# Patient Record
Sex: Female | Born: 1975 | Race: Black or African American
Health system: Southern US, Community
[De-identification: ages and names within clinical notes are randomized; demographics above are authoritative.]

## PROBLEM LIST (undated history)

## (undated) ENCOUNTER — Emergency Department
Discharge status: Against Medical Advice | Payer: No Typology Code available for payment source | Attending: Emergency Medicine | Admitting: Emergency Medicine

## (undated) DIAGNOSIS — R569 Unspecified convulsions: Secondary | ICD-10-CM

## (undated) DIAGNOSIS — I10 Essential (primary) hypertension: Secondary | ICD-10-CM

## (undated) DIAGNOSIS — E785 Hyperlipidemia, unspecified: Secondary | ICD-10-CM

## (undated) DIAGNOSIS — R002 Palpitations: Secondary | ICD-10-CM

## (undated) DIAGNOSIS — G43909 Migraine, unspecified, not intractable, without status migrainosus: Secondary | ICD-10-CM

## (undated) DIAGNOSIS — I712 Thoracic aortic aneurysm, without rupture, unspecified: Secondary | ICD-10-CM

## (undated) DIAGNOSIS — E7800 Pure hypercholesterolemia, unspecified: Secondary | ICD-10-CM

## (undated) DIAGNOSIS — F449 Dissociative and conversion disorder, unspecified: Secondary | ICD-10-CM

## (undated) DIAGNOSIS — R079 Chest pain, unspecified: Secondary | ICD-10-CM

## (undated) DIAGNOSIS — I639 Cerebral infarction, unspecified: Secondary | ICD-10-CM

## (undated) HISTORY — PX: APPENDECTOMY (OPEN): SHX54

## (undated) HISTORY — PX: ABDOMINAL HERNIA REPAIR: SHX539

## (undated) HISTORY — PX: CHOLECYSTECTOMY: SHX55

## (undated) HISTORY — PX: HYSTERECTOMY: SHX81

---

## 2013-05-03 ENCOUNTER — Encounter (HOSPITAL_COMMUNITY): Payer: Self-pay | Admitting: Emergency Medicine

## 2013-05-03 ENCOUNTER — Emergency Department (HOSPITAL_COMMUNITY): Payer: Medicaid - Out of State

## 2013-05-03 ENCOUNTER — Emergency Department (HOSPITAL_COMMUNITY)
Admission: EM | Admit: 2013-05-03 | Discharge: 2013-05-04 | Disposition: A | Discharge status: Home / Self Care | Payer: Medicaid - Out of State | Attending: Emergency Medicine | Admitting: Emergency Medicine

## 2013-05-03 DIAGNOSIS — R112 Nausea with vomiting, unspecified: Secondary | ICD-10-CM | POA: Insufficient documentation

## 2013-05-03 DIAGNOSIS — I10 Essential (primary) hypertension: Secondary | ICD-10-CM | POA: Insufficient documentation

## 2013-05-03 DIAGNOSIS — G40909 Epilepsy, unspecified, not intractable, without status epilepticus: Secondary | ICD-10-CM | POA: Insufficient documentation

## 2013-05-03 DIAGNOSIS — R0602 Shortness of breath: Secondary | ICD-10-CM | POA: Insufficient documentation

## 2013-05-03 DIAGNOSIS — M79609 Pain in unspecified limb: Secondary | ICD-10-CM

## 2013-05-03 DIAGNOSIS — R0789 Other chest pain: Secondary | ICD-10-CM | POA: Insufficient documentation

## 2013-05-03 DIAGNOSIS — R079 Chest pain, unspecified: Secondary | ICD-10-CM

## 2013-05-03 DIAGNOSIS — Z79899 Other long term (current) drug therapy: Secondary | ICD-10-CM | POA: Insufficient documentation

## 2013-05-03 DIAGNOSIS — R609 Edema, unspecified: Secondary | ICD-10-CM | POA: Insufficient documentation

## 2013-05-03 DIAGNOSIS — M7989 Other specified soft tissue disorders: Secondary | ICD-10-CM

## 2013-05-03 HISTORY — DX: Unspecified convulsions: R56.9

## 2013-05-03 HISTORY — DX: Essential (primary) hypertension: I10

## 2013-05-03 LAB — CBC
HCT: 33 % — ABNORMAL LOW (ref 36.0–46.0)
Hemoglobin: 10.3 g/dL — ABNORMAL LOW (ref 12.0–15.0)
MCH: 24.9 pg — ABNORMAL LOW (ref 26.0–34.0)
MCHC: 31.2 g/dL (ref 30.0–36.0)
MCV: 79.7 fL (ref 78.0–100.0)
PLATELETS: 156 10*3/uL (ref 150–400)
RBC: 4.14 MIL/uL (ref 3.87–5.11)
RDW: 15.5 % (ref 11.5–15.5)
WBC: 10 10*3/uL (ref 4.0–10.5)

## 2013-05-03 LAB — BASIC METABOLIC PANEL
BUN: 9 mg/dL (ref 6–23)
CALCIUM: 9.1 mg/dL (ref 8.4–10.5)
CO2: 26 meq/L (ref 19–32)
CREATININE: 0.85 mg/dL (ref 0.50–1.10)
Chloride: 102 mEq/L (ref 96–112)
GFR calc non Af Amer: 86 mL/min — ABNORMAL LOW (ref >90)
Glucose, Bld: 88 mg/dL (ref 70–99)
Potassium: 3.4 mEq/L — ABNORMAL LOW (ref 3.7–5.3)
SODIUM: 140 meq/L (ref 137–147)

## 2013-05-03 LAB — I-STAT TROPONIN, ED
TROPONIN I, POC: 0.01 ng/mL (ref 0.00–0.08)
Troponin i, poc: 0 ng/mL (ref 0.00–0.08)
Troponin i, poc: 0 ng/mL (ref 0.00–0.08)

## 2013-05-03 LAB — POC URINE PREG, ED: Preg Test, Ur: NEGATIVE

## 2013-05-03 MED ORDER — HYDROCODONE-ACETAMINOPHEN 5-325 MG PO TABS
2.0000 | ORAL_TABLET | Freq: Once | ORAL | Status: AC
  Administered 2013-05-03: 2 via ORAL
  Filled 2013-05-03: qty 2

## 2013-05-03 MED ORDER — HYDROMORPHONE HCL PF 1 MG/ML IJ SOLN
1.0000 mg | Freq: Once | INTRAMUSCULAR | Status: AC
  Administered 2013-05-03: 1 mg via INTRAVENOUS
  Filled 2013-05-03: qty 1

## 2013-05-03 NOTE — ED Provider Notes (Signed)
CSN: [id number]     Arrival date & time 05/03/13  1543 History   First MD Initiated Contact with Patient 05/03/13 1730     Chief Complaint  Patient presents with  . Chest Pain     (Consider location/radiation/quality/duration/timing/severity/associated sxs/prior Treatment) HPI 38 year old female with no history of blood clots, not on birth control, did travel about 5 hours within the last week now presents with a two-hour spell this morning approximately 7 AM a sudden onset sharp stabbing chest pain shortness breath feeling with nausea and a few episodes of nonbloody vomiting, spell resolved completely then a few hours later she developed some pain to her left calf and mild swelling to her left lower leg compared to right lower leg, she has had persistent lower leg pain and swelling throughout the day today and upon arrival to the emergency room she developed some mild vague chest discomfort present for the last couple hours without shortness of breath without nausea or vomiting without radiation or associated symptoms, there is no treatment prior to arrival. Past Medical History  Diagnosis Date  . Seizures   . Hypertension    History reviewed. No pertinent past surgical history. No family history on file. History  Substance Use Topics  . Smoking status: Never Smoker   . Smokeless tobacco: Not on file  . Alcohol Use: No   OB History   Grav Para Term Preterm Abortions TAB SAB Ect Mult Living                 Review of Systems 10 Systems reviewed and are negative for acute change except as noted in the HPI.   Allergies  Contrast media and Shellfish allergy  Home Medications   Current Outpatient Rx  Name  Route  Sig  Dispense  Refill  . diltiazem (CARDIZEM CD) 180 MG 24 hr capsule   Oral   Take 180 mg by mouth daily.         Marland Kitchen EPINEPHrine (EPI-PEN) 0.3 mg/0.3 mL SOAJ injection   Intramuscular   Inject 0.3 mg into the muscle once.         . levETIRAcetam (KEPPRA) 500  MG tablet   Oral   Take 500 mg by mouth 2 (two) times daily.          BP 126/54  Pulse 62  Temp(Src) 98.1 F (36.7 C) (Oral)  Resp 18  Wt 309 lb 3 oz (140.247 kg)  SpO2 96%  LMP 04/05/2013 Physical Exam  Nursing note and vitals reviewed. Constitutional:  Awake, alert, nontoxic appearance.  HENT:  Head: Atraumatic.  Eyes: Right eye exhibits no discharge. Left eye exhibits no discharge.  Neck: Neck supple.  Cardiovascular: Normal rate and regular rhythm.   No murmur heard. Pulmonary/Chest: Effort normal and breath sounds normal. No respiratory distress. She has no wheezes. She has no rales. She exhibits no tenderness.  Abdominal: Soft. There is no tenderness. There is no rebound.  Musculoskeletal: She exhibits edema and tenderness.  Baseline ROM, no obvious new focal weakness. Right leg is nontender with trace edema to her lower leg. Left leg is nontender thigh no asymmetry of calves bilaterally however left ankle has mild edema slightly more so than the right ankle with left foot normal light touch capillary refill less than 2 seconds dorsalis pedis pulse intact good movement nontender ankle and foot has tenderness diffusely left calf without left thigh tenderness  Neurological:  Mental status and motor strength appears baseline for patient and situation.  Skin: No rash noted.  Psychiatric: She has a normal mood and affect.    ED Course  Procedures (including critical care time)  Patient understand and agree with initial ED impression and plan with expectations set for ED visit. Low risk PTP Wells PE, PERC neg. [place] neg for DVT. CT chest without PNA (not performed to r/o PE since Pt PERC neg but had questionable PNA on CXR) Pt stable in ED with no significant deterioration in condition. Patient / Family / Caregiver informed of clinical course, understand medical decision-making process, and agree with plan. Labs Review Labs Reviewed  CBC - Abnormal; Notable for the following:     Hemoglobin 10.3 (*)    HCT 33.0 (*)    MCH 24.9 (*)    All other components within normal limits  BASIC METABOLIC PANEL - Abnormal; Notable for the following:    Potassium 3.4 (*)    GFR calc non Af Amer 86 (*)    All other components within normal limits  I-STAT TROPOININ, ED  POC URINE PREG, ED  I-STAT TROPOININ, ED  Rosezena Sensor, ED   Imaging Review Dg Chest 2 View  05/03/2013   CLINICAL DATA:  Left-sided chest pain. Shortness of breath. Lower extremity edema.  EXAM: CHEST  2 VIEW  COMPARISON:  None.  FINDINGS: Suboptimal inspiration due to body habitus accounts for crowded bronchovascular markings, especially in the bases, and accentuates the cardiac silhouette. Taking this into account, cardiomediastinal silhouette unremarkable. Streaky and patchy airspace opacities in the left lower lobe. Lungs otherwise clear. No localized airspace consolidation. No pleural effusions. No pneumothorax. Normal pulmonary vascularity. Visualized bony thorax intact.  IMPRESSION: Suboptimal inspiration.  Left lower lobe pneumonia.   Electronically Signed   By: Hulan Saas M.D.   On: 05/03/2013 18:22   Ct Chest Wo Contrast  05/03/2013   CLINICAL DATA:  Chest pain, diaphoresis and vomiting.  Palpitations.  EXAM: CT CHEST WITHOUT CONTRAST  TECHNIQUE: Multidetector CT imaging of the chest was performed following the standard protocol without IV contrast.  COMPARISON:  Chest radiograph performed earlier today at 6:09 p.m.  FINDINGS: Minimal bibasilar atelectasis is noted. The lungs are otherwise clear. There is no evidence of pulmonary infarct. No significant focal consolidation, pleural effusion or pneumothorax is seen. No masses are identified.  The mediastinum is unremarkable in appearance. No mediastinal lymphadenopathy is seen. No pericardial effusions identified. The great vessels are grossly unremarkable in appearance. The visualized portions of gallbladder grossly unremarkable. No axillary  lymphadenopathy is seen.  The visualized portions of the liver and spleen are grossly unremarkable.  No acute osseous abnormalities are identified.  IMPRESSION: Minimal bibasilar atelectasis noted; lungs otherwise clear. No evidence for pulmonary infarct. Pulmonary embolus cannot be excluded on a noncontrast study.   Electronically Signed   By: Roanna Raider M.D.   On: 05/03/2013 22:14    EKG Interpretation    Date/Time:  Saturday May 03 2013 15:49:08 EST Ventricular Rate:  85 PR Interval:  144 QRS Duration: 86 QT Interval:  410 QTC Calculation: 487 R Axis:   35 Text Interpretation:  Normal sinus rhythm Possible Inferior infarct , age undetermined Anterolateral infarct , age undetermined No previous ECGs available Confirmed by [facility]  MD, Jonny Ruiz [phone]) on 05/03/2013 5:35:23 PM            MDM   Final diagnoses:  Chest pain    I doubt any other EMC precluding discharge at this time including, but not necessarily limited to the following:PE,  ACS.    Hurman HornJohn M Kailea Dannemiller, MD 05/05/13 [phone]

## 2013-05-03 NOTE — ED Notes (Signed)
Pt reports started having chest pain, sweating, and vomiting.  Pt states she has had 2 episodes since last nite.  Reports palpitations and heart racing.  Left leg swelling from ankle to knee and pulse palpable.  Pt was recently on 4 hour train ride on Monday.  Pt reports sob.

## 2013-05-03 NOTE — ED Notes (Signed)
Unsuccessful IV attempts x 2. Dr. Fonnie JarvisBednar aware

## 2013-05-03 NOTE — Discharge Instructions (Signed)
Your caregiver has diagnosed you as having chest pain that is not specific for one problem, but does not require admission.  You are at low risk for an acute heart condition or other serious illness. Chest pain comes from many different causes.  SEEK IMMEDIATE MEDICAL ATTENTION IF: You have severe chest pain, especially if the pain is crushing or pressure-like and spreads to the arms, back, neck, or jaw, or if you have sweating, nausea (feeling sick to your stomach), or shortness of breath. THIS IS AN EMERGENCY. Don't wait to see if the pain will go away. Get medical help at once. Call 911 or 0 (operator). DO NOT drive yourself to the hospital.  Your chest pain gets worse and does not go away with rest.  You have an attack of chest pain lasting longer than usual, despite rest and treatment with the medications your caregiver has prescribed.  You wake from sleep with chest pain or shortness of breath.  You feel dizzy or faint.  You have chest pain not typical of your usual pain for which you originally saw your caregiver.  Although you appear stable for discharge, you may still have a blood clot in your leg(s) called a DVT (deep venous thrombosis).  You may have received initial treatment with an injection of a blood thinner to treat DVTs, but low risk patients do not always get treated before an ultrasound (US) is performed to diagnose or rule out a DVT, due to the risks of bleeding from the medication used.  It is important you follow up for an US within one week as directed. Return to the nearest ED immediately if you develop chest pain, shortness of breath, dizziness or fainting, new color change or weakness/numbness to your affected leg(s), bleeding, severe headache, confusion or altered mental status, or other concerns.

## 2013-05-03 NOTE — ED Notes (Signed)
Clarified with Dr. Fonnie JarvisBednar that patietn is scheduled for angio but is allergic to shellfish.  Also patient is complaining of pain.  MD acknowledges, orders pain medication and allows for CT angio study.

## 2013-05-03 NOTE — ED Notes (Signed)
Patient transported to X-ray 

## 2013-05-03 NOTE — Progress Notes (Signed)
VASCULAR LAB PRELIMINARY  PRELIMINARY  PRELIMINARY  PRELIMINARY  Left lower extremity venous Doppler completed.    Preliminary report:  There is no obvious evidence of DVT or SVT noted in the left lower extremity.  Jeremyah Jelley, RVT 05/03/2013, 6:24 PM

## 2013-05-04 MED ORDER — ONDANSETRON HCL 4 MG PO TABS
8.0000 mg | ORAL_TABLET | Freq: Once | ORAL | Status: DC
Start: 2013-05-04 — End: 2013-05-04

## 2013-05-04 MED ORDER — ONDANSETRON 4 MG PO TBDP
ORAL_TABLET | ORAL | Status: AC
  Administered 2013-05-04: 8 mg via ORAL
  Filled 2013-05-04: qty 2

## 2013-05-04 MED ORDER — ONDANSETRON 4 MG PO TBDP
8.0000 mg | ORAL_TABLET | Freq: Once | ORAL | Status: AC
  Administered 2013-05-04: 8 mg via ORAL

## 2013-05-04 NOTE — ED Notes (Signed)
Pt became very nauseas and had episode of vomiting. Medication given for nausea per protocol. Pt given emesis bag for ride home. Patient stated she did not want to stay and wanted to go home. Pt wheeled out via wheelchair.

## 2014-10-24 IMAGING — CT CT CHEST W/O CM
2 of 3 series · 15 of 36 positions shown, 18 images · non-contrast
Comparison: Chest radiograph performed earlier today at [DATE] p.m.

CLINICAL DATA: Chest pain, diaphoresis and vomiting.  Palpitations.

EXAM:
CT CHEST WITHOUT CONTRAST
TECHNIQUE: Multidetector CT imaging of the chest was performed following the
standard protocol without IV contrast.

[Series 2: thorax 5.0 i31f 1 · axial · 0.65mm/px · z∈[+1169,+1399]mm · 12 of 54 slices shown, 15 images]
[im 4/54  mediastinal]
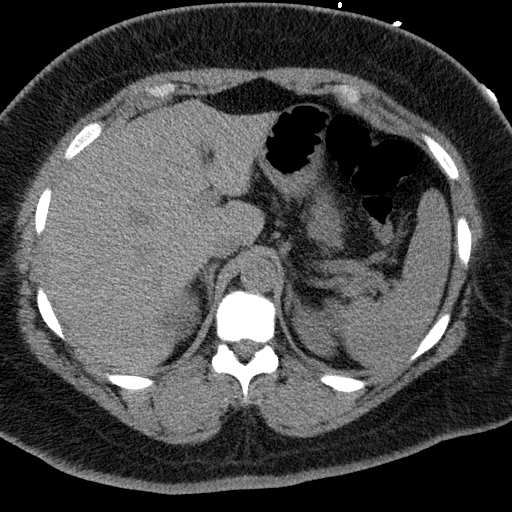
[im 4/54  lung]
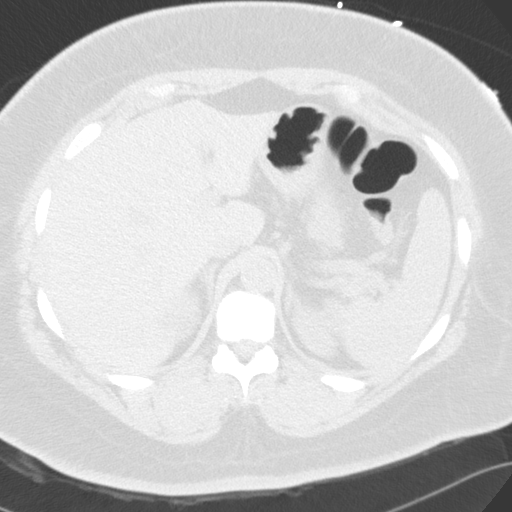
[im 8/54  lung]
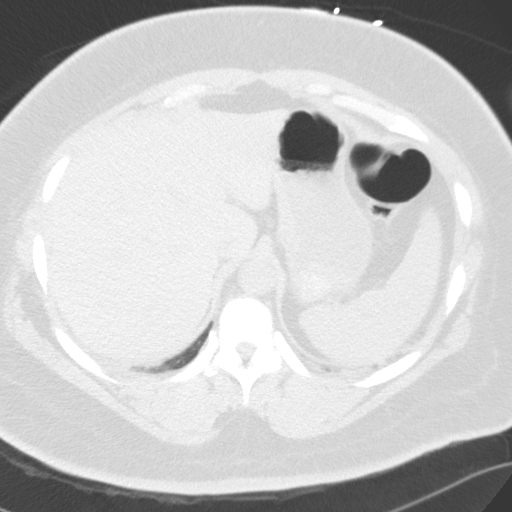
[im 12/54  lung]
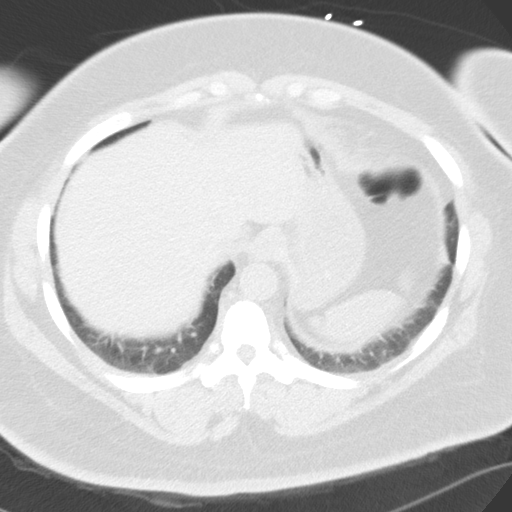
[im 16/54  lung]
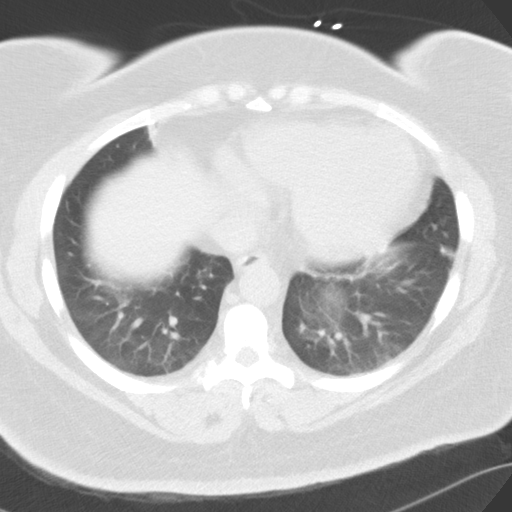
[im 20/54  mediastinal]
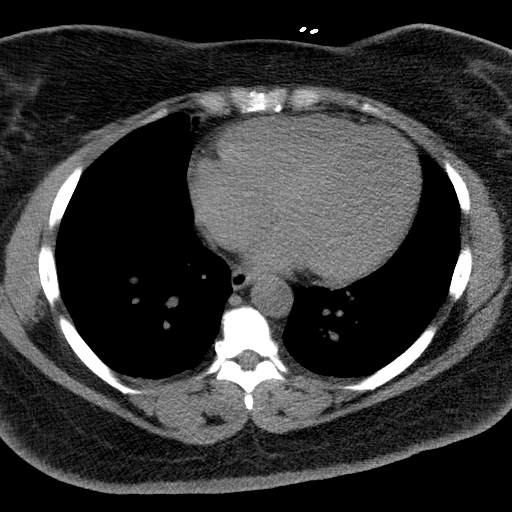
[im 20/54  lung]
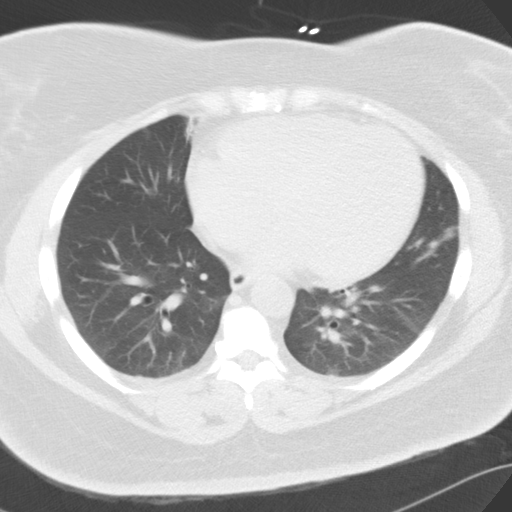
[im 24/54  lung]
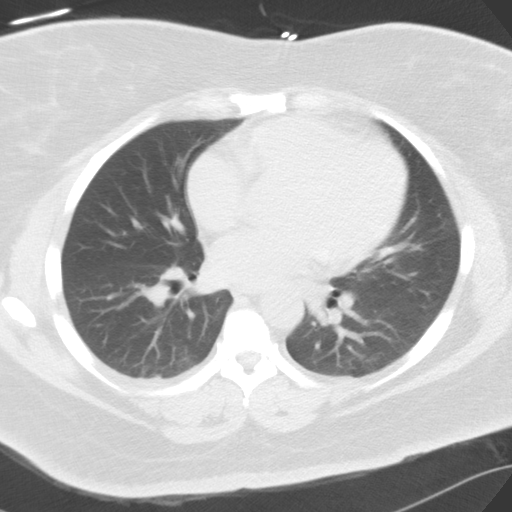
[im 30/54  lung]
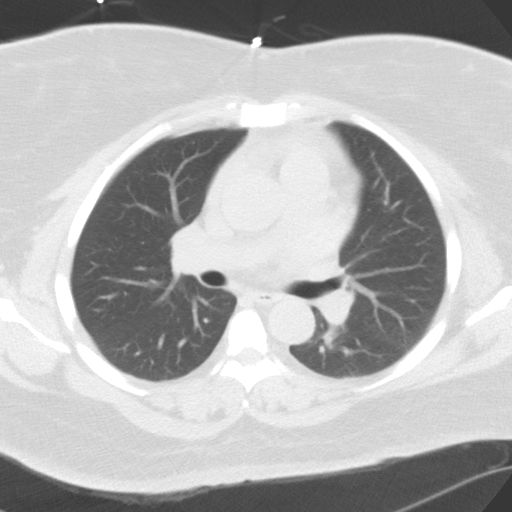
[im 34/54  lung]
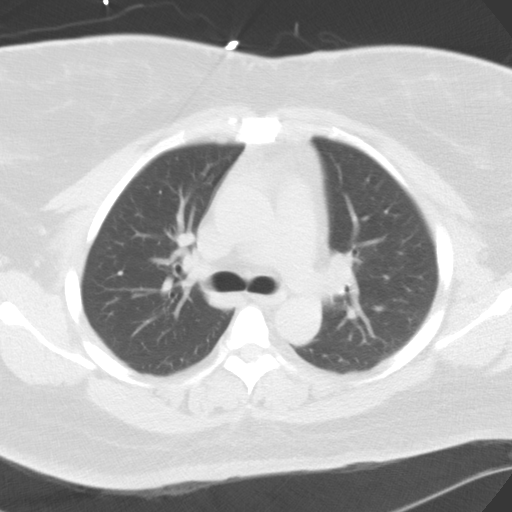
[im 38/54  mediastinal]
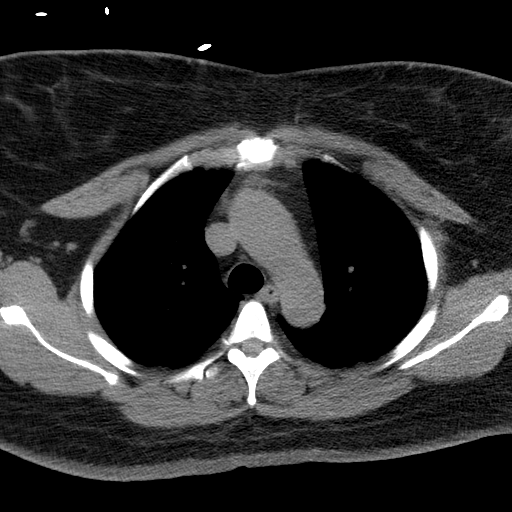
[im 38/54  lung]
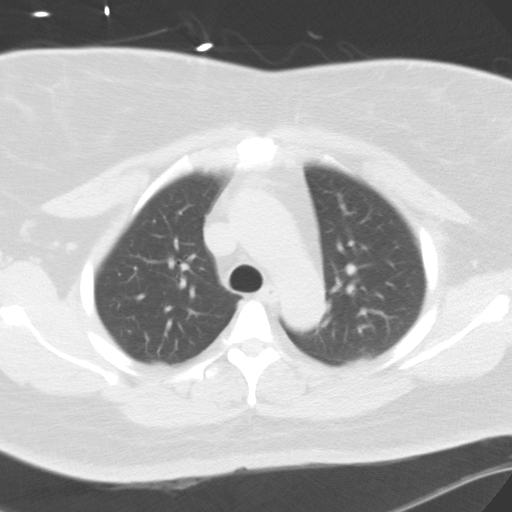
[im 42/54  lung]
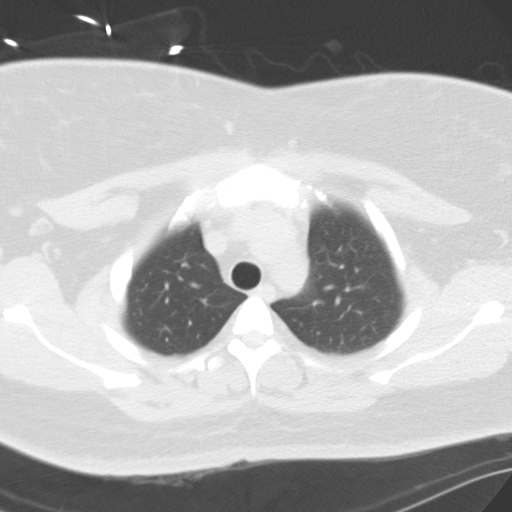
[im 46/54  lung]
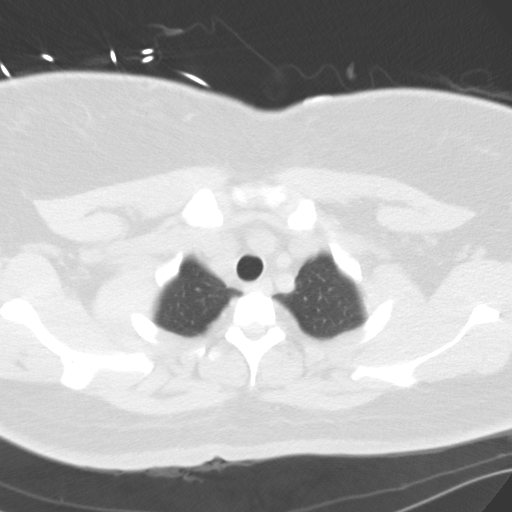
[im 50/54  lung]
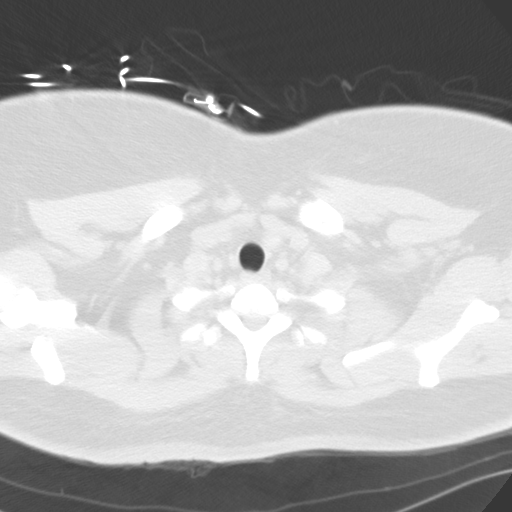

[Series 7: coronal · coronal · 0.59mm/px · 3 of 75 slices shown]
[im 15/75  lung]
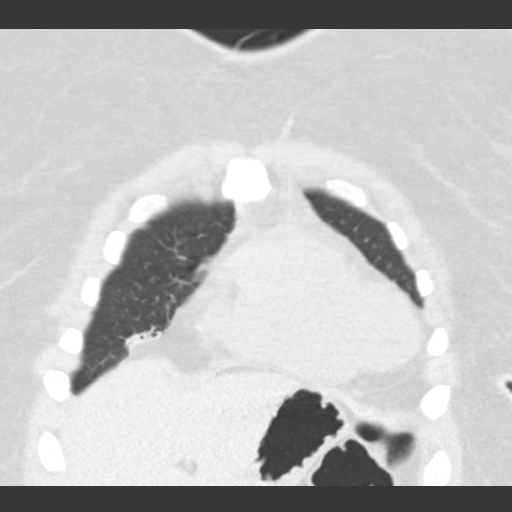
[im 30/75  lung]
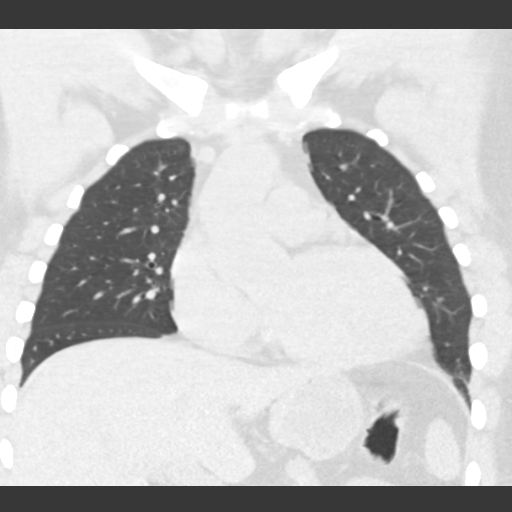
[im 45/75  lung]
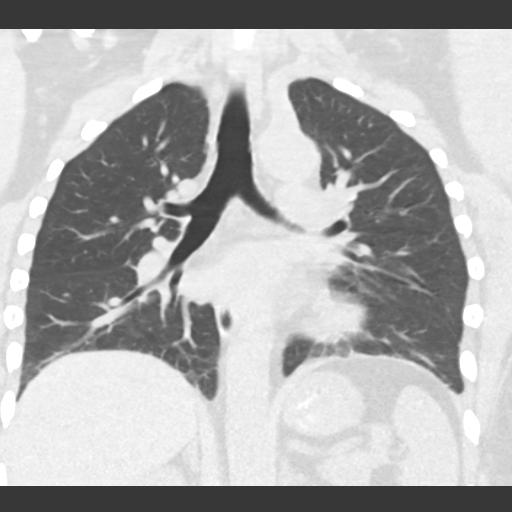

[15 of 36 positions shown; findings below may reference images not displayed]

FINDINGS: Minimal bibasilar atelectasis is noted. The lungs are otherwise
clear. There is no evidence of pulmonary infarct. No significant
focal consolidation, pleural effusion or pneumothorax is seen. No
masses are identified.

The mediastinum is unremarkable in appearance. No mediastinal
lymphadenopathy is seen. No pericardial effusions identified. The
great vessels are grossly unremarkable in appearance. The visualized
portions of gallbladder grossly unremarkable. No axillary
lymphadenopathy is seen.

The visualized portions of the liver and spleen are grossly
unremarkable.

No acute osseous abnormalities are identified.
IMPRESSION: Minimal bibasilar atelectasis noted; lungs otherwise clear. No
evidence for pulmonary infarct. Pulmonary embolus cannot be excluded
on a noncontrast study.

## 2014-10-24 IMAGING — CR DG CHEST 2V
2 series · 2 of 2 positions shown · non-contrast
Comparison: None.

CLINICAL DATA: Left-sided chest pain. Shortness of breath. Lower
extremity edema.

EXAM:
CHEST  2 VIEW

[w chest pa]
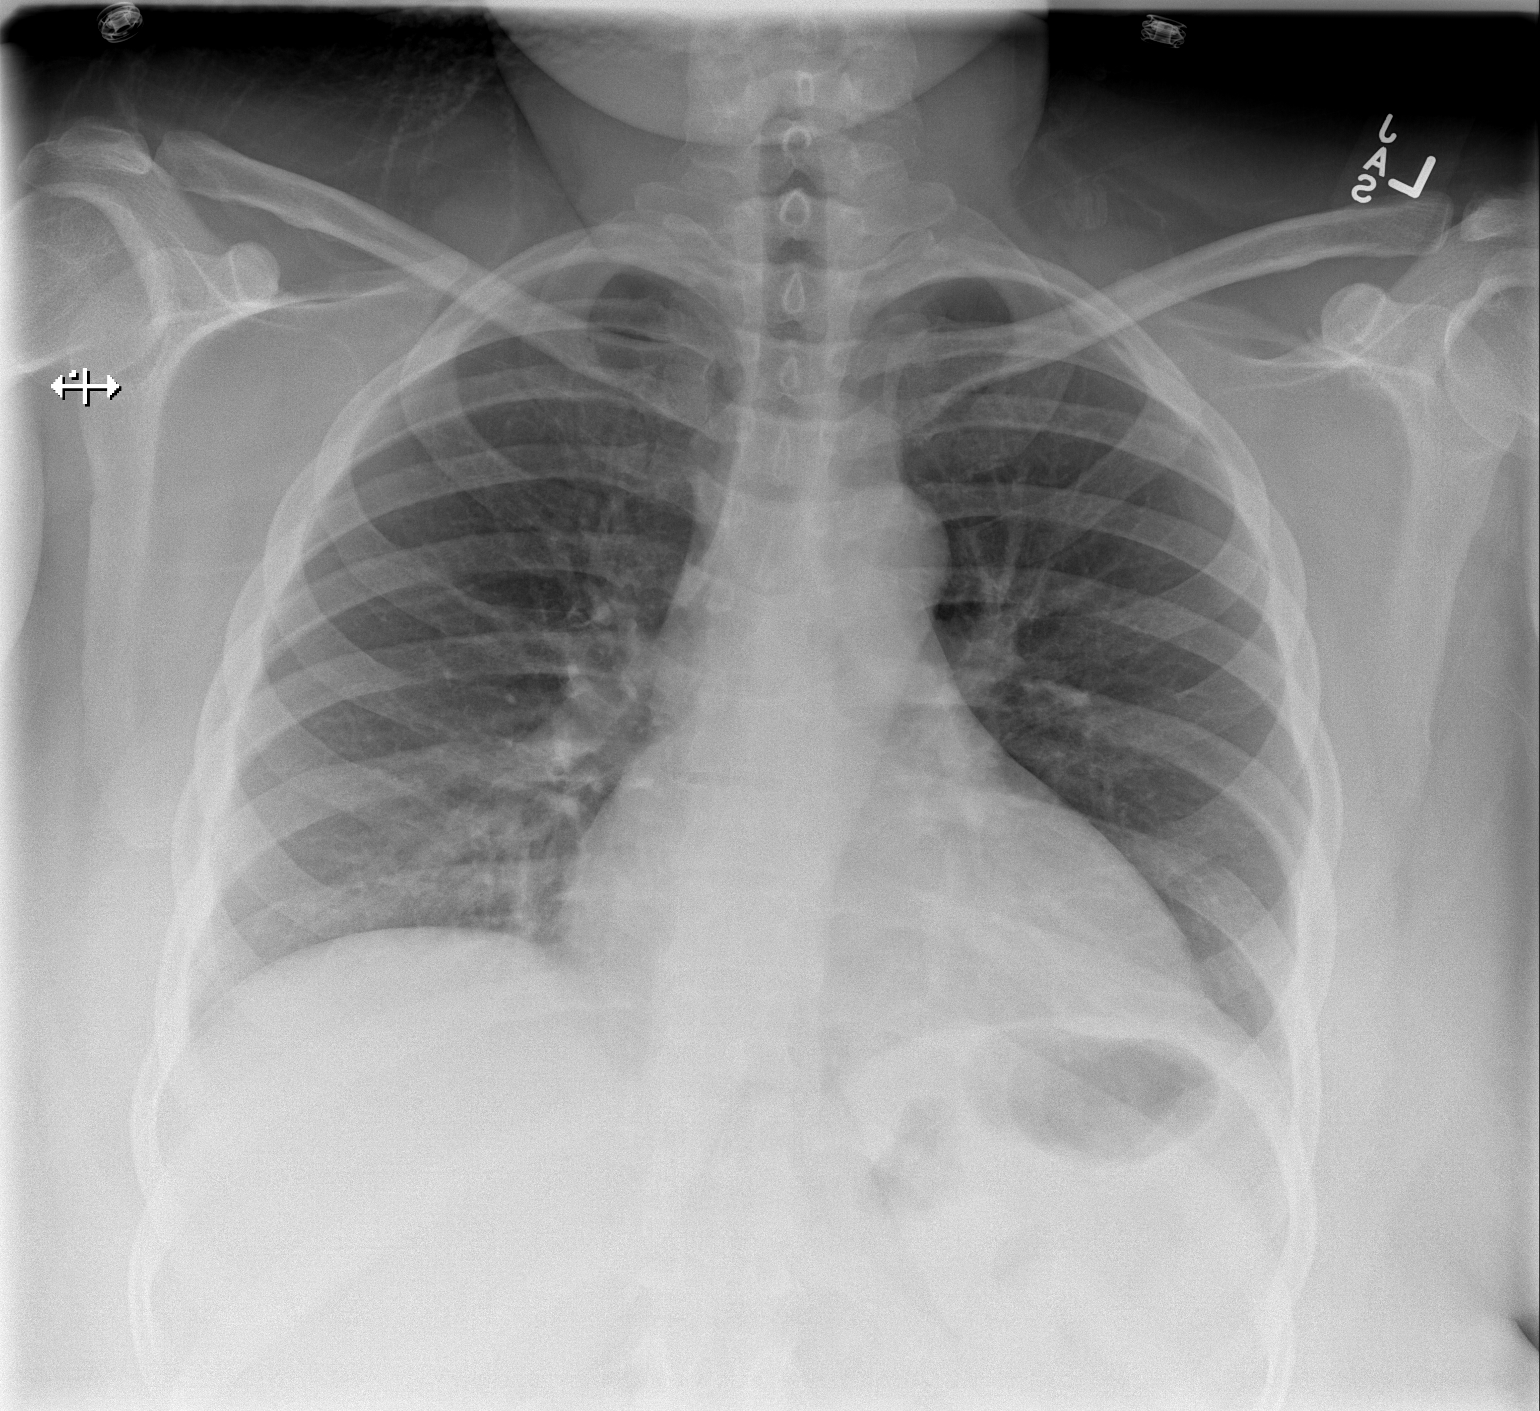

[w chest lat]
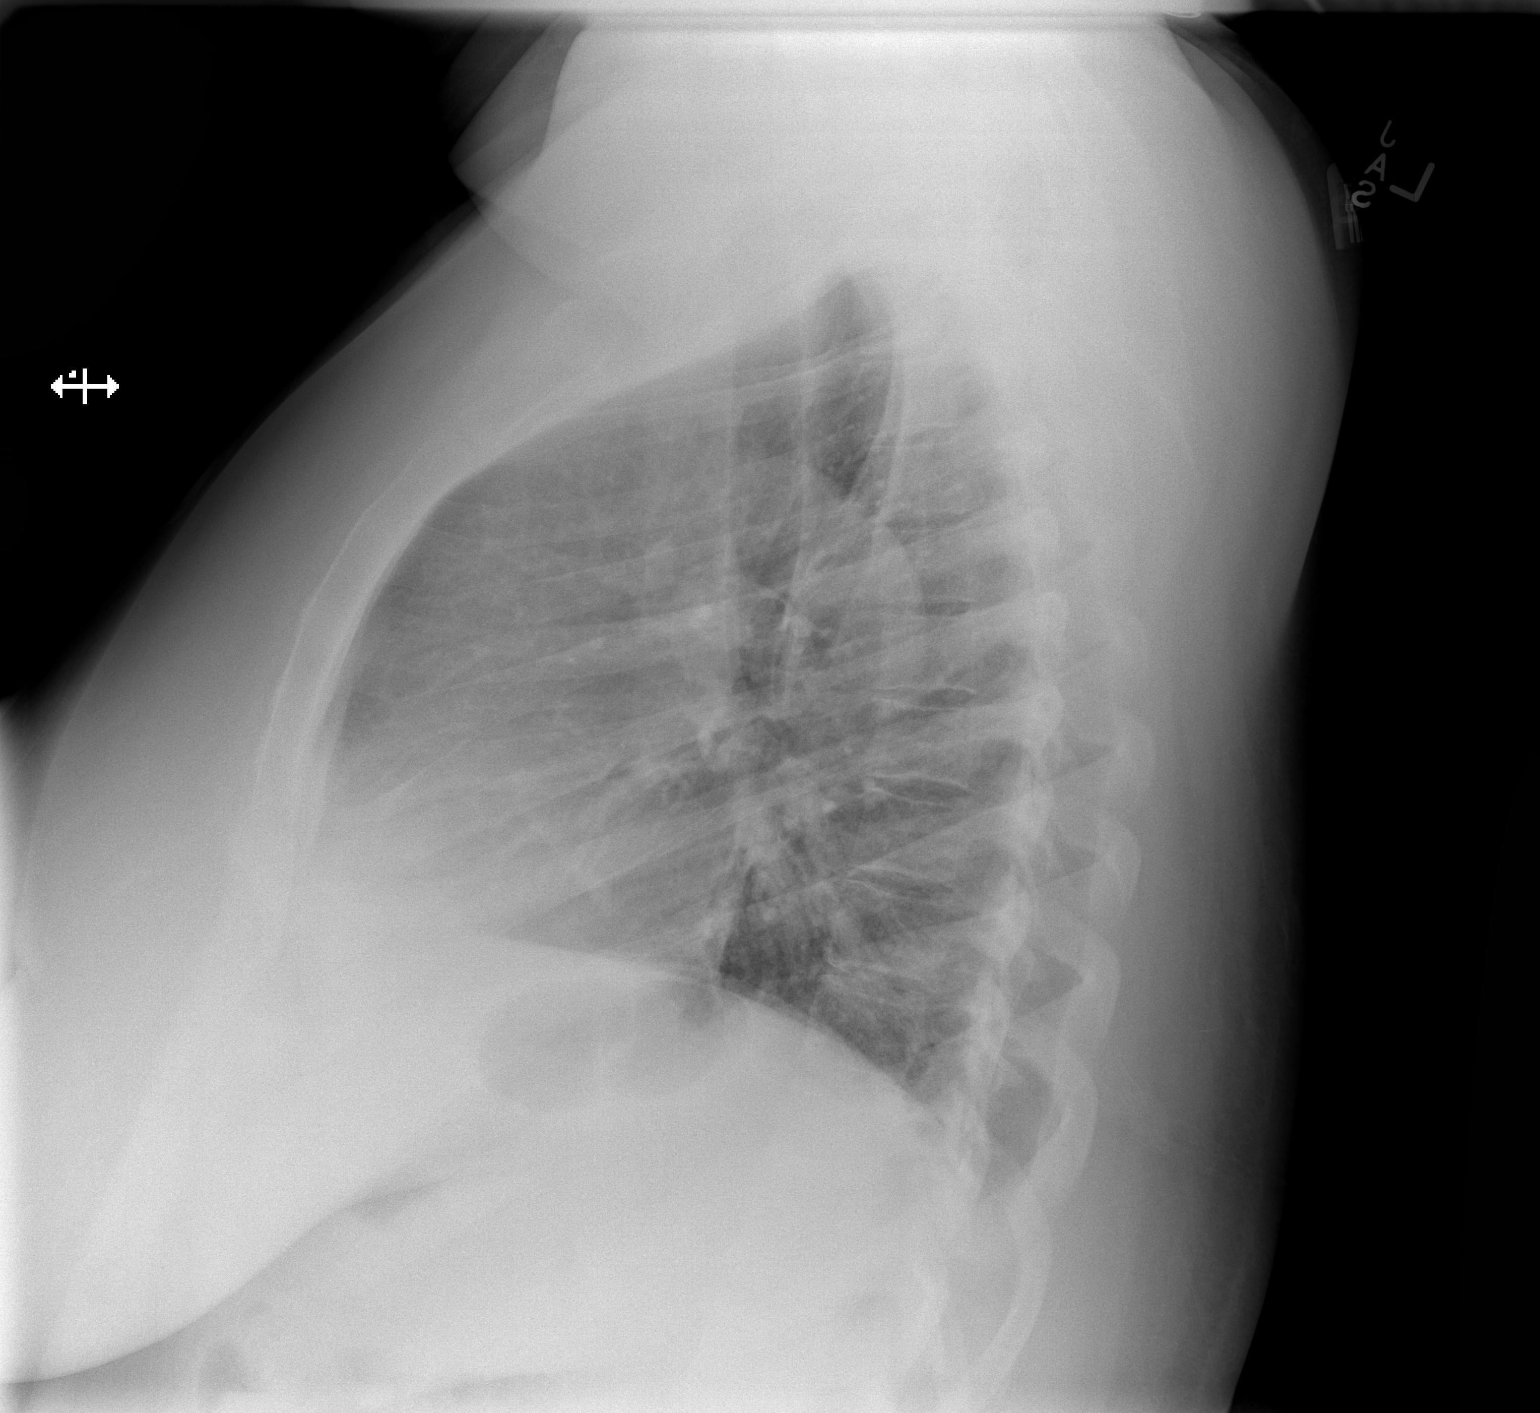

[2 of 2 positions shown; findings below may reference images not displayed]

FINDINGS: Suboptimal inspiration due to body habitus accounts for crowded
bronchovascular markings, especially in the bases, and accentuates
the cardiac silhouette. Taking this into account, cardiomediastinal
silhouette unremarkable. Streaky and patchy airspace opacities in
the left lower lobe. Lungs otherwise clear. No localized airspace
consolidation. No pleural effusions. No pneumothorax. Normal
pulmonary vascularity. Visualized bony thorax intact.
IMPRESSION: Suboptimal inspiration.  Left lower lobe pneumonia.

## 2015-03-14 DIAGNOSIS — G459 Transient cerebral ischemic attack, unspecified: Secondary | ICD-10-CM

## 2015-03-14 HISTORY — DX: Transient cerebral ischemic attack, unspecified: G45.9

## 2015-06-17 ENCOUNTER — Emergency Department: Payer: Medicaid Other

## 2015-06-17 ENCOUNTER — Observation Stay
Admission: EM | Admit: 2015-06-17 | Discharge: 2015-06-19 | Disposition: A | Discharge status: Home / Self Care | Payer: Medicaid Other | Attending: Internal Medicine | Admitting: Internal Medicine

## 2015-06-17 ENCOUNTER — Observation Stay: Payer: Self-pay | Admitting: Student in an Organized Health Care Education/Training Program

## 2015-06-17 DIAGNOSIS — E7800 Pure hypercholesterolemia, unspecified: Secondary | ICD-10-CM | POA: Insufficient documentation

## 2015-06-17 DIAGNOSIS — I10 Essential (primary) hypertension: Secondary | ICD-10-CM | POA: Insufficient documentation

## 2015-06-17 DIAGNOSIS — R079 Chest pain, unspecified: Principal | ICD-10-CM | POA: Insufficient documentation

## 2015-06-17 DIAGNOSIS — R002 Palpitations: Secondary | ICD-10-CM | POA: Insufficient documentation

## 2015-06-17 DIAGNOSIS — M25532 Pain in left wrist: Secondary | ICD-10-CM | POA: Insufficient documentation

## 2015-06-17 DIAGNOSIS — R0789 Other chest pain: Secondary | ICD-10-CM

## 2015-06-17 DIAGNOSIS — R569 Unspecified convulsions: Secondary | ICD-10-CM | POA: Insufficient documentation

## 2015-06-17 DIAGNOSIS — R072 Precordial pain: Secondary | ICD-10-CM

## 2015-06-17 HISTORY — DX: Essential (primary) hypertension: I10

## 2015-06-17 LAB — COMPREHENSIVE METABOLIC PANEL
ALT: 12 U/L (ref 0–55)
AST (SGOT): 16 U/L (ref 5–34)
Albumin/Globulin Ratio: 0.9 (ref 0.9–2.2)
Albumin: 3.9 g/dL (ref 3.5–5.0)
Alkaline Phosphatase: 94 U/L (ref 37–106)
Anion Gap: 8 (ref 5.0–15.0)
BUN: 13 mg/dL (ref 7.0–19.0)
Bilirubin, Total: 0.8 mg/dL (ref 0.2–1.2)
CO2: 26 mEq/L (ref 22–29)
Calcium: 9.4 mg/dL (ref 8.5–10.5)
Chloride: 106 mEq/L (ref 100–111)
Creatinine: 0.9 mg/dL (ref 0.6–1.0)
Globulin: 4.5 g/dL — ABNORMAL HIGH (ref 2.0–3.6)
Glucose: 85 mg/dL (ref 70–100)
Potassium: 4.2 mEq/L (ref 3.5–5.1)
Protein, Total: 8.4 g/dL — ABNORMAL HIGH (ref 6.0–8.3)
Sodium: 140 mEq/L (ref 136–145)

## 2015-06-17 LAB — CBC AND DIFFERENTIAL
Basophils Absolute Automated: 0.03 10*3/uL (ref 0.00–0.20)
Basophils Automated: 0 %
Eosinophils Absolute Automated: 0.26 10*3/uL (ref 0.00–0.70)
Eosinophils Automated: 3 %
Hematocrit: 32.7 % — ABNORMAL LOW (ref 37.0–47.0)
Hgb: 9.9 g/dL — ABNORMAL LOW (ref 12.0–16.0)
Immature Granulocytes Absolute: 0.02 10*3/uL
Immature Granulocytes: 0 %
Lymphocytes Absolute Automated: 2.41 10*3/uL (ref 0.50–4.40)
Lymphocytes Automated: 26 %
MCH: 23.6 pg — ABNORMAL LOW (ref 28.0–32.0)
MCHC: 30.3 g/dL — ABNORMAL LOW (ref 32.0–36.0)
MCV: 78 fL — ABNORMAL LOW (ref 80.0–100.0)
Monocytes Absolute Automated: 0.58 10*3/uL (ref 0.00–1.20)
Monocytes: 6 %
Neutrophils Absolute: 6.06 10*3/uL (ref 1.80–8.10)
Neutrophils: 65 %
Nucleated RBC: 0 /100 WBC (ref 0–1)
Platelets: 180 10*3/uL (ref 140–400)
RBC: 4.19 10*6/uL — ABNORMAL LOW (ref 4.20–5.40)
RDW: 17 % — ABNORMAL HIGH (ref 12–15)
WBC: 9.56 10*3/uL (ref 3.50–10.80)

## 2015-06-17 LAB — HEMOLYSIS INDEX: Hemolysis Index: 4 (ref 0–18)

## 2015-06-17 LAB — GFR: EGFR: >60

## 2015-06-17 LAB — TROPONIN I
Troponin I: <0.01 ng/mL (ref 0.00–0.09)
Troponin I: <0.01 ng/mL (ref 0.00–0.09)

## 2015-06-17 MED ORDER — ACETAMINOPHEN 325 MG PO TABS
650.0000 mg | ORAL_TABLET | Freq: Once | ORAL | Status: AC
Start: 2015-06-17 — End: 2015-06-17
  Administered 2015-06-17: 650 mg via ORAL
  Filled 2015-06-17: qty 2

## 2015-06-17 MED ORDER — MORPHINE SULFATE 10 MG/ML IJ/IV SOLN (WRAP)
6.0000 mg | Freq: Once | Status: AC
Start: 2015-06-17 — End: 2015-06-17
  Administered 2015-06-17: 6 mg via INTRAVENOUS
  Filled 2015-06-17: qty 1

## 2015-06-17 MED ORDER — FAMOTIDINE 20 MG PO TABS
20.0000 mg | ORAL_TABLET | Freq: Once | ORAL | Status: AC
Start: 2015-06-17 — End: 2015-06-17
  Administered 2015-06-17: 20 mg via ORAL
  Filled 2015-06-17: qty 1

## 2015-06-17 MED ORDER — ALUM & MAG HYDROXIDE-SIMETH 200-200-20 MG/5ML PO SUSP
30.0000 mL | Freq: Once | ORAL | Status: AC
Start: 2015-06-17 — End: 2015-06-17
  Administered 2015-06-17: 30 mL via ORAL
  Filled 2015-06-17: qty 30

## 2015-06-17 NOTE — ED Provider Notes (Signed)
EMERGENCY DEPARTMENT HISTORY AND PHYSICAL EXAM     Physician/Midlevel provider first contact with patient: 06/17/15 2047         Date: 06/17/2015  Patient Name: [name] Cook    History of Presenting Illness     Chief Complaint   Patient presents with   . Chest Pain   . Wrist Pain       History Provided By: Patient    Chief Complaint: Chest pain   Onset: 7:00 PM   Timing: Gradual  Location: Mid-sternal   Quality: Tightness    Severity: Moderate, 7/10   Associated Symptoms: Diaphoresis, nausea, lightheadedness, palpitations, SOB, wrist pain    Additional History: [name] Cook is a 40 y.o. female with history of HTN and seizures p/w an episode of chest pain that she describes as tightness starting at 7:00 PM with associated diaphoresis, SOB, nausea, lightheadedness, and palpitations that she describes "beating so hard out of my chest." Pain worse with laying flat. Pt also reports intermittent left wrist pain over the last 2 months for which she has been taking Tylenol and wearing a brace without significant relief. Father with history of MI at age 76. Pt denies history of smoking.     PCP: No primary care provider on file.  SPECIALISTS:     No current facility-administered medications for this encounter.     Current Outpatient Prescriptions   Medication Sig Dispense Refill   . LABETALOL HCL PO Take by mouth.     . LevETIRAcetam (KEPPRA PO) Take by mouth.         Past History     Past Medical History:  Past Medical History   Diagnosis Date   . Hypertension    . Convulsions        Past Surgical History:  History reviewed. No pertinent past surgical history.    Family History:  History reviewed. No pertinent family history.    Social History:  Social History   Substance Use Topics   . Smoking status: Never Smoker    . Smokeless tobacco: None   . Alcohol Use: No       Allergies:  Allergies   Allergen Reactions   . Iodine    . Percocet [Oxycodone-Acetaminophen]    . Shellfish-Derived Products    . Toradol [Ketorolac  Tromethamine]    . Tramadol        Review of Systems     Review of Systems   Constitutional: Positive for diaphoresis.   Respiratory: Positive for shortness of breath.    Cardiovascular: Positive for chest pain.   Gastrointestinal: Positive for nausea.   Musculoskeletal: Positive for joint pain (left wrist).   Neurological:        +Lightheadedness    Endo/Heme/Allergies:        Allergy to iodine, Percocet, shellfish, Toradol, and Tramadol   Psychiatric/Behavioral: Negative for suicidal ideas and substance abuse.   All other systems reviewed and are negative.       Physical Exam   BP 127/66 mmHg  Pulse 68  Temp(Src) 97.8 F (36.6 C) (Oral)  Resp 18  Wt 126.554 kg  SpO2 96%  LMP 05/28/2015      Physical Exam   Constitutional: Patient is oriented to person, place, and time and well-developed, well-nourished, and in no distress.   Head: Normocephalic and atraumatic.   ENT/Eyes: EOM are normal. Pupils are equal, round, and reactive to light. MMM.  Neck: Normal range of motion. Neck supple.   Cardiovascular: Normal rate  and regular rhythm.   Pulmonary/Chest: Effort normal and breath sounds normal. No respiratory distress.   Abdominal: Soft. There is no tenderness. No rebound or guarding.  Musculoskeletal: Normal range of motion. No lower extremity edema. No calf tenderness. Left wrist pain with flexion and extension. No left shoulder or elbow tenderness. Normal LUE pulses.   Neurological: Patient is alert and oriented to person, place, and time. No gross weakness. Neurologically intact distally to LUE.   Skin: Skin is warm and dry.       Diagnostic Study Results     Labs -     Results     Procedure Component Value Units Date/Time    Troponin I #2 [[id number]] Collected:  06/17/15 2252    Specimen Information:  Blood Updated:  06/17/15 2319     Troponin I <0.01 ng/mL     Narrative:      Enter the appropriate time    Troponin I [[id number]] Collected:  06/17/15 2020    Specimen Information:  Blood Updated:  06/17/15  2225     Troponin I <0.01 ng/mL     Comprehensive metabolic panel [[id number]]  (Abnormal) Collected:  06/17/15 2020    Specimen Information:  Blood Updated:  06/17/15 2101     Glucose 85 mg/dL      BUN 03.4 mg/dL      Creatinine 0.9 mg/dL      Sodium 742 mEq/L      Potassium 4.2 mEq/L      Chloride 106 mEq/L      CO2 26 mEq/L      Calcium 9.4 mg/dL      Protein, Total 8.4 (H) g/dL      Albumin 3.9 g/dL      AST (SGOT) 16 U/L      ALT 12 U/L      Alkaline Phosphatase 94 U/L      Bilirubin, Total 0.8 mg/dL      Globulin 4.5 (H) g/dL      Albumin/Globulin Ratio 0.9      Anion Gap 8.0     Hemolysis index [[id number]] Collected:  06/17/15 2020     Hemolysis Index 4 Updated:  06/17/15 2101    GFR [[id number]] Collected:  06/17/15 2020     EGFR >60.0 Updated:  06/17/15 2101    CBC and differential [[id number]]  (Abnormal) Collected:  06/17/15 2020    Specimen Information:  Blood from Blood Updated:  06/17/15 2056     WBC 9.56 x10 3/uL      Hgb 9.9 (L) g/dL      Hematocrit 60.1 (L) %      Platelets 180 x10 3/uL      RBC 4.19 (L) x10 6/uL      MCV 78.0 (L) fL      MCH 23.6 (L) pg      MCHC 30.3 (L) g/dL      RDW 17 (H) %      MPV Unmeasured fL      Neutrophils 65 %      Lymphocytes Automated 26 %      Monocytes 6 %      Eosinophils Automated 3 %      Basophils Automated 0 %      Immature Granulocyte 0 %      Nucleated RBC 0 /100 WBC      Neutrophils Absolute 6.06 x10 3/uL      Abs Lymph Automated 2.41 x10 3/uL      Abs Mono  Automated 0.58 x10 3/uL      Abs Eos Automated 0.26 x10 3/uL      Absolute Baso Automated 0.03 x10 3/uL      Absolute Immature Granulocyte 0.02 x10 3/uL           Radiologic Studies -   Radiology Results (24 Hour)     Procedure Component Value Units Date/Time    XR Chest AP Portable [[id number]] Collected:  06/17/15 2050    Order Status:  Completed Updated:  06/17/15 2055    Narrative:      CLINICAL INDICATION: diffuse chest pain     COMPARISON: None available    INTERPRETATION:   A single frontal view of the  chest was obtained. .     The cardiomediastinal contour  is within normal limits for age. The  lungs are clear and the sulci are sharp.         Impression:        No acute cardiopulmonary process.    Wyatt Portela, MD   06/17/2015 8:50 PM        .      Medical Decision Making   I am the first provider for this patient.    I reviewed the vital signs, available nursing notes, past medical history, past surgical history, family history and social history.    Vital Signs: Reviewed the patient's vital signs.     Patient Vitals for the past 12 hrs:   BP Temp Pulse Resp   06/17/15 2149 127/66 mmHg 97.8 F (36.6 C) 68 18   06/17/15 1932 153/79 mmHg 97.7 F (36.5 C) 68 18       Pulse Oximetry Analysis:  Normal 98% on RA     Cardiac Monitor:  Rate: 64 bpm  Rhythm:  Normal Sinus Rhythm     EKG:  Interpreted by the Emergency Physician.   Time Interpreted: 1948   Rate: 65 bpm    Rhythm: Normal Sinus Rhythm    Interpretation: Normal axis. No ST changes    Comparison: No prior study is available for comparison.     EKG:  Interpreted by the Emergency Physician.   Time Interpreted: 2259   Rate: 68 bpm    Rhythm: Normal Sinus Rhythm    Interpretation: Normal axis. No ST changes    Comparison: Unchanged compared to 1948    Core Measures: 12-lead EKG was performed in the ED. Aspirin was not given as pt took PTA.     Admit Decision Time:  The decision to admit this patient was made by the emergency provider at 11:29 PM on 06/17/2015     Clinical Decision Support:   Heart Score         Value    History  2    EKG  0    Risk Factors  2    Total (with age)  4    Onset of pain (time of START of last episode of chest pain)?  0-3 hrs ago    Timing of repeat Troponin/EKG order  HEART Score exceeds limit for Chest Pain Pathway        Old Medical Records: Nursing notes.     ED Course:   9:28 PM - Reviewed results with pt and discussed plan for repeat troponin and EKG at 10:45 PM     10:03 PM - Checked on pt who states she is having increased pain,  will order Morphine. Pt now also mentions her sister passed away last  year from an MI at age 14.     11:21 PM - Discussed results and tx plan, including possibility of hospitalization, pt understands, agrees, and feels comfortable with plan.     11:29 PM - Discussed pt case with Dr. Merlene Laughter, internal medicine, who agrees to accept.     11:32 PM - Spoke with Dr. Loma Newton, cardiology, who will consult.     Provider Notes: 80F with very strong family hx of ACS presented with typical chest pain. Trop neg. EKG nonischemic. Discussed admission vs close cardiology f/u. Prefers admission. ASA. No red flags for PE or dissection.       Diagnosis     Clinical Impression:   1. Chest pain of uncertain etiology        Treatment Plan:   ED Disposition     Observation Admitting Physician: Derek Mound [[id number]]  Diagnosis: Chest pain [[id number]]  Estimated Length of Stay: < 2 midnights  Tentative Discharge Plan?: Home or Self Care [1]  Patient Class: Observation [104]  Bed request comments: Remote telemetry                _______________________________    Attestations: This note was prepared by Silva Bandy, acting as scribe for Burgess Estelle, MD. The scribe's documentation has been prepared under my direction and personally reviewed by me in its entirety.  I confirm that the note above accurately reflects all work, treatment, procedures, and medical decision making performed by me.  _______________________________             Roxine Caddy, MD  06/20/15 2300

## 2015-06-17 NOTE — ED Notes (Signed)
Pain and swelling L wrist ax two months, but more concerned w sudden onset c/p one hour ago described as intermittent sharp and pressure. Nausea, no vomiting. Also reports SOB.

## 2015-06-17 NOTE — H&P (Signed)
[facility]      Patient: [name] Cook  Date: 06/17/2015   DOB: [birth year]  Admission Date: 06/17/2015   MRN: [id number]  Attending: Derek Mound         Chief Complaint   Patient presents with   . Chest Pain   . Wrist Pain      History Gathered From: Patient     HISTORY AND PHYSICAL     [name] Cook is a 40 y.o. female with a PMHx of hypertension, seizures who presented with chest pain located in midsternal region that started earlier this evening. Pt reports pain to be 8/10 in intensity, intermittent in nature. Pt was laying in bed at the time of the onset of pain. She states that she went up to use the bathroom and suddenly noted herself to be diaphoretic and experiencing chest pain which she describes as pressure like and someone sitting on her chest. Pt reports pain to be radiating to her left arm and other associated symptoms to be jaw pain, nausea, no vomiting, sob. Pt reports dyspnea since the past two weeks and needing to use 6 pillows at night to breathe better. Pt denies any increased stressors or change in physical activity. She reports compliance to medications.     Past Medical History   Diagnosis Date   . Hypertension    . Convulsions        History reviewed. No pertinent past surgical history.    Prior to Admission medications    Medication Sig Start Date End Date Taking? Authorizing Provider   LABETALOL HCL PO Take by mouth.   Yes [provider]   LevETIRAcetam (KEPPRA PO) Take by mouth.   Yes [provider]       Allergies   Allergen Reactions   . Iodine    . Percocet [Oxycodone-Acetaminophen]    . Shellfish-Derived Products    . Toradol [Ketorolac Tromethamine]    . Tramadol        CODE STATUS: Full     PRIMARY CARE MD: No primary care provider on file.    Family history   Father: MI in 78s  Sister: MI at age 107     Social History   Substance Use Topics   . Smoking status: Never Smoker    . Smokeless tobacco: None   . Alcohol Use: No       REVIEW OF SYSTEMS   Positive  for: chest pain, sob, nausea, orthopnea, jaw pain, arm pain   Negative for: edema, abdominal pain, vomiting, urinary complaints, diarrhea/constiaption, anxiety   All ROS completed and otherwise negative.    PHYSICAL EXAM     Vital Signs (most recent): BP 127/66 mmHg  Pulse 68  Temp(Src) 97.8 F (36.6 C) (Oral)  Resp 18  Wt 126.554 kg (279 lb)  SpO2 96%  LMP 05/28/2015  Constitutional: No apparent distress. Patient speaks freely in full sentences.   HEENT: NC/AT, PERRL, no scleral icterus or conjunctival pallor, no nasal discharge, MMM, oropharynx without erythema or exudate  Neck: trachea midline, supple, no cervical or supraclavicular lymphadenopathy or masses  Cardiovascular: RRR, normal S1 S2, no murmurs, gallops, palpable thrills, no JVD, Non-displaced PMI.  Respiratory: Normal rate. No retractions or increased work of breathing. Clear to auscultation and percussion bilaterally.  Gastrointestinal: +BS, non-distended, soft, non-tender, no rebound or guarding, no hepatosplenomegaly  Genitourinary: no suprapubic or costovertebral angle tenderness  Musculoskeletal: ROM and motor strength grossly normal. No clubbing, edema, or cyanosis. DP and  radial pulses 2+ and symmetric.  Skin exam:  pink  Neurologic: EOMI, CN 2-12 grossly intact. no gross motor or sensory deficits  Psychiatric: AAOx3, affect and mood appropriate. The patient is alert, interactive, appropriate.  Capillary refill:  Normal    Exam done by Derek Mound, MD on 06/17/2015 at 11:49 PM      LABS & IMAGING     Recent Results (from the past 24 hour(s))   CBC and differential    Collection Time: 06/17/15  8:20 PM   Result Value Ref Range    WBC 9.56 3.50 - 10.80 x10 3/uL    Hgb 9.9 (L) 12.0 - 16.0 g/dL    Hematocrit 16.1 (L) 37.0 - 47.0 %    Platelets 180 140 - 400 x10 3/uL    RBC 4.19 (L) 4.20 - 5.40 x10 6/uL    MCV 78.0 (L) 80.0 - 100.0 fL    MCH 23.6 (L) 28.0 - 32.0 pg    MCHC 30.3 (L) 32.0 - 36.0 g/dL    RDW 17 (H) 12 - 15 %    MPV Unmeasured  9.4 - 12.3 fL    Neutrophils 65 None %    Lymphocytes Automated 26 None %    Monocytes 6 None %    Eosinophils Automated 3 None %    Basophils Automated 0 None %    Immature Granulocyte 0 None %    Nucleated RBC 0 0 - 1 /100 WBC    Neutrophils Absolute 6.06 1.80 - 8.10 x10 3/uL    Abs Lymph Automated 2.41 0.50 - 4.40 x10 3/uL    Abs Mono Automated 0.58 0.00 - 1.20 x10 3/uL    Abs Eos Automated 0.26 0.00 - 0.70 x10 3/uL    Absolute Baso Automated 0.03 0.00 - 0.20 x10 3/uL    Absolute Immature Granulocyte 0.02 0 x10 3/uL   Comprehensive metabolic panel    Collection Time: 06/17/15  8:20 PM   Result Value Ref Range    Glucose 85 70 - 100 mg/dL    BUN 09.6 7.0 - 04.5 mg/dL    Creatinine 0.9 0.6 - 1.0 mg/dL    Sodium 409 811 - 914 mEq/L    Potassium 4.2 3.5 - 5.1 mEq/L    Chloride 106 100 - 111 mEq/L    CO2 26 22 - 29 mEq/L    Calcium 9.4 8.5 - 10.5 mg/dL    Protein, Total 8.4 (H) 6.0 - 8.3 g/dL    Albumin 3.9 3.5 - 5.0 g/dL    AST (SGOT) 16 5 - 34 U/L    ALT 12 0 - 55 U/L    Alkaline Phosphatase 94 37 - 106 U/L    Bilirubin, Total 0.8 0.2 - 1.2 mg/dL    Globulin 4.5 (H) 2.0 - 3.6 g/dL    Albumin/Globulin Ratio 0.9 0.9 - 2.2    Anion Gap 8.0 5.0 - 15.0   Hemolysis index    Collection Time: 06/17/15  8:20 PM   Result Value Ref Range    Hemolysis Index 4 0 - 18   GFR    Collection Time: 06/17/15  8:20 PM   Result Value Ref Range    EGFR >60.0    Troponin I    Collection Time: 06/17/15  8:20 PM   Result Value Ref Range    Troponin I <0.01 0.00 - 0.09 ng/mL   Troponin I #2    Collection Time: 06/17/15 10:52 PM   Result Value Ref  Range    Troponin I <0.01 0.00 - 0.09 ng/mL     IMAGING:  CXR  No acute cardiopulmonary process.    Markers:    Recent Labs  Lab 06/17/15  2252 06/17/15  2020   TROPONIN I <0.01 <0.01       EMERGENCY DEPARTMENT COURSE:  Orders Placed This Encounter   Procedures   . XR Chest AP Portable   . Hemolysis index   . GFR   . CBC and differential   . Comprehensive metabolic panel   . Hemolysis index   . GFR    . Troponin I   . Troponin I #2   . Diet regular   . Pulse Oximetry   . Cardiac monitoring (ED ONLY)   . ED Unit Sec Comm Order   . ED Unit Sec Comm Order   . ECG 12 Lead   . ECG 12 lead #2   . Insert peripheral IV   . Saline lock IV   . Place (admit) for Observation Services   . [facility] ED Bed Request (Observation)       ASSESSMENT & PLAN     [name] Cook is a 40 y.o. female admitted under sound [facility] with chest pain     Patient Active Hospital Problem List:   Chest pain (06/17/2015)   Hypertension    Seizures    Hypercholesteremia     - placed on PTA meds  - EKG reviewed, no significant ST segment changes  - troponin negative x2, serial troponins ordered  - labs: lipid profile, a1c ordered  - cardiology consulted by ED physician  - NPO at midnight     Nutrition  NPO     DVT/VTE Prophylaxis  Lovenox     Fayette Pho    06/17/2015 11:49 PM  Time Elapsed: 45 minutes

## 2015-06-18 ENCOUNTER — Observation Stay: Payer: Medicaid Other

## 2015-06-18 DIAGNOSIS — R0789 Other chest pain: Secondary | ICD-10-CM

## 2015-06-18 DIAGNOSIS — R072 Precordial pain: Secondary | ICD-10-CM

## 2015-06-18 LAB — CBC AND DIFFERENTIAL
Basophils Absolute Automated: 0.02 10*3/uL (ref 0.00–0.20)
Basophils Automated: 0 %
Eosinophils Absolute Automated: 0.25 10*3/uL (ref 0.00–0.70)
Eosinophils Automated: 3 %
Hematocrit: 30.2 % — ABNORMAL LOW (ref 37.0–47.0)
Hgb: 8.9 g/dL — ABNORMAL LOW (ref 12.0–16.0)
Immature Granulocytes Absolute: 0.01 10*3/uL
Immature Granulocytes: 0 %
Lymphocytes Absolute Automated: 3.14 10*3/uL (ref 0.50–4.40)
Lymphocytes Automated: 32 %
MCH: 23.2 pg — ABNORMAL LOW (ref 28.0–32.0)
MCHC: 29.5 g/dL — ABNORMAL LOW (ref 32.0–36.0)
MCV: 78.9 fL — ABNORMAL LOW (ref 80.0–100.0)
Monocytes Absolute Automated: 0.66 10*3/uL (ref 0.00–1.20)
Monocytes: 7 %
Neutrophils Absolute: 5.65 10*3/uL (ref 1.80–8.10)
Neutrophils: 58 %
Nucleated RBC: 0 /100 WBC (ref 0–1)
Platelets: 163 10*3/uL (ref 140–400)
RBC: 3.83 10*6/uL — ABNORMAL LOW (ref 4.20–5.40)
RDW: 17 % — ABNORMAL HIGH (ref 12–15)
WBC: 9.72 10*3/uL (ref 3.50–10.80)

## 2015-06-18 LAB — LIPID PANEL
Cholesterol / HDL Ratio: 3.9
Cholesterol: 140 mg/dL (ref 0–199)
HDL: 36 mg/dL — ABNORMAL LOW (ref 40–9999)
LDL Calculated: 87 mg/dL (ref 0–99)
Triglycerides: 83 mg/dL (ref 34–149)
VLDL Calculated: 17 mg/dL (ref 10–40)

## 2015-06-18 LAB — HEMOGLOBIN A1C
Average Estimated Glucose: 108.3 mg/dL
Hemoglobin A1C: 5.4 % (ref 4.6–5.9)

## 2015-06-18 LAB — COMPREHENSIVE METABOLIC PANEL
ALT: 10 U/L (ref 0–55)
AST (SGOT): 14 U/L (ref 5–34)
Albumin/Globulin Ratio: 0.9 (ref 0.9–2.2)
Albumin: 3.4 g/dL — ABNORMAL LOW (ref 3.5–5.0)
Alkaline Phosphatase: 80 U/L (ref 37–106)
Anion Gap: 6 (ref 5.0–15.0)
BUN: 13 mg/dL (ref 7.0–19.0)
Bilirubin, Total: 0.6 mg/dL (ref 0.2–1.2)
CO2: 26 mEq/L (ref 22–29)
Calcium: 8.5 mg/dL (ref 8.5–10.5)
Chloride: 107 mEq/L (ref 100–111)
Creatinine: 0.9 mg/dL (ref 0.6–1.0)
Globulin: 3.8 g/dL — ABNORMAL HIGH (ref 2.0–3.6)
Glucose: 96 mg/dL (ref 70–100)
Potassium: 4 mEq/L (ref 3.5–5.1)
Protein, Total: 7.2 g/dL (ref 6.0–8.3)
Sodium: 139 mEq/L (ref 136–145)

## 2015-06-18 LAB — GFR: EGFR: >60

## 2015-06-18 LAB — B-TYPE NATRIURETIC PEPTIDE: B-Natriuretic Peptide: 15 pg/mL (ref 0–100)

## 2015-06-18 LAB — PHOSPHORUS: Phosphorus: 2.9 mg/dL (ref 2.3–4.7)

## 2015-06-18 LAB — IHS D-DIMER: D-Dimer: 0.56 ug/mL FEU — ABNORMAL HIGH (ref 0.00–0.51)

## 2015-06-18 LAB — URINE HCG QUALITATIVE: Urine HCG Qualitative: NEGATIVE

## 2015-06-18 LAB — MAGNESIUM: Magnesium: 2.3 mg/dL (ref 1.6–2.6)

## 2015-06-18 LAB — HEMOLYSIS INDEX
Hemolysis Index: 9 (ref 0–18)
Hemolysis Index: 25 — ABNORMAL HIGH (ref 0–18)

## 2015-06-18 LAB — TROPONIN I: Troponin I: <0.01 ng/mL (ref 0.00–0.09)

## 2015-06-18 MED ORDER — ONDANSETRON HCL 4 MG/2ML IJ SOLN
4.0000 mg | Freq: Three times a day (TID) | INTRAMUSCULAR | Status: DC | PRN
Start: 2015-06-18 — End: 2015-06-18

## 2015-06-18 MED ORDER — ENOXAPARIN SODIUM 40 MG/0.4ML SC SOLN
40.0000 mg | Freq: Every day | SUBCUTANEOUS | Status: DC
Start: 2015-06-18 — End: 2015-06-18
  Administered 2015-06-18: 40 mg via SUBCUTANEOUS
  Filled 2015-06-18: qty 0.4

## 2015-06-18 MED ORDER — ACETAMINOPHEN 650 MG RE SUPP
650.0000 mg | RECTAL | Status: DC | PRN
Start: 2015-06-18 — End: 2015-06-19

## 2015-06-18 MED ORDER — LEVETIRACETAM 500 MG PO TABS
1000.0000 mg | ORAL_TABLET | Freq: Two times a day (BID) | ORAL | Status: DC
Start: 2015-06-18 — End: 2015-06-18

## 2015-06-18 MED ORDER — NALOXONE HCL 0.4 MG/ML IJ SOLN (WRAP)
0.2000 mg | INTRAMUSCULAR | Status: DC | PRN
Start: 2015-06-18 — End: 2015-06-19

## 2015-06-18 MED ORDER — SODIUM CHLORIDE 0.9 % IV SOLN
INTRAVENOUS | Status: DC
Start: 2015-06-18 — End: 2015-06-18

## 2015-06-18 MED ORDER — MORPHINE SULFATE 4 MG/ML IJ/IV SOLN (WRAP)
4.0000 mg | Status: DC | PRN
Start: 2015-06-18 — End: 2015-06-18
  Administered 2015-06-18 (×2): 4 mg via INTRAVENOUS
  Filled 2015-06-18 (×2): qty 1

## 2015-06-18 MED ORDER — ENOXAPARIN SODIUM 150 MG/ML SC SOLN
1.0000 mg/kg | Freq: Two times a day (BID) | SUBCUTANEOUS | Status: DC
Start: 2015-06-18 — End: 2015-06-19
  Administered 2015-06-18: 130 mg via SUBCUTANEOUS
  Filled 2015-06-18 (×3): qty 1

## 2015-06-18 MED ORDER — ACETAMINOPHEN 325 MG PO TABS
650.0000 mg | ORAL_TABLET | Freq: Four times a day (QID) | ORAL | Status: AC
Start: 2015-06-18 — End: 2015-06-19
  Administered 2015-06-18: 650 mg via ORAL
  Filled 2015-06-18 (×2): qty 2

## 2015-06-18 MED ORDER — ONDANSETRON HCL 4 MG/2ML IJ SOLN
4.0000 mg | Freq: Four times a day (QID) | INTRAMUSCULAR | Status: DC | PRN
Start: 2015-06-18 — End: 2015-06-19

## 2015-06-18 MED ORDER — ACETAMINOPHEN 325 MG PO TABS
650.0000 mg | ORAL_TABLET | ORAL | Status: DC | PRN
Start: 2015-06-18 — End: 2015-06-19
  Administered 2015-06-19: 650 mg via ORAL
  Filled 2015-06-18: qty 2

## 2015-06-18 MED ORDER — LABETALOL HCL 200 MG PO TABS
200.0000 mg | ORAL_TABLET | Freq: Three times a day (TID) | ORAL | Status: DC
Start: 2015-06-18 — End: 2015-06-19
  Administered 2015-06-18 – 2015-06-19 (×4): 200 mg via ORAL
  Filled 2015-06-18 (×4): qty 1

## 2015-06-18 MED ORDER — MORPHINE SULFATE 2 MG/ML IJ/IV SOLN (WRAP)
1.0000 mg | Status: AC | PRN
Start: 2015-06-18 — End: 2015-06-19
  Administered 2015-06-18 (×3): 1 mg via INTRAVENOUS
  Filled 2015-06-18 (×3): qty 1

## 2015-06-18 MED ORDER — ONDANSETRON 4 MG PO TBDP
4.0000 mg | ORAL_TABLET | Freq: Four times a day (QID) | ORAL | Status: DC | PRN
Start: 2015-06-18 — End: 2015-06-19

## 2015-06-18 MED ORDER — MORPHINE SULFATE 2 MG/ML IJ/IV SOLN (WRAP)
1.0000 mg | Freq: Once | Status: AC
Start: 2015-06-18 — End: 2015-06-18
  Administered 2015-06-18: 1 mg via INTRAVENOUS
  Filled 2015-06-18: qty 1

## 2015-06-18 MED ORDER — TECHNETIUM TC 99M PENTETATE AEROSOL
26.4000 | Freq: Once | Status: AC | PRN
Start: 2015-06-18 — End: 2015-06-18
  Administered 2015-06-18: 26 via RESPIRATORY_TRACT

## 2015-06-18 MED ORDER — LEVETIRACETAM 500 MG PO TABS
500.0000 mg | ORAL_TABLET | Freq: Two times a day (BID) | ORAL | Status: DC
Start: 2015-06-18 — End: 2015-06-19
  Administered 2015-06-18 – 2015-06-19 (×3): 500 mg via ORAL
  Filled 2015-06-18 (×3): qty 1

## 2015-06-18 MED ORDER — TECHNETIUM TC 99M ALBUMIN AGGREGATED
5.2000 | Freq: Once | Status: AC | PRN
Start: 2015-06-18 — End: 2015-06-18
  Administered 2015-06-18: 5 via INTRAVENOUS

## 2015-06-18 MED ORDER — LABETALOL HCL 200 MG PO TABS
100.0000 mg | ORAL_TABLET | Freq: Two times a day (BID) | ORAL | Status: DC
Start: 2015-06-18 — End: 2015-06-18

## 2015-06-18 NOTE — Progress Notes (Signed)
Patient admitted to room 2312 b from the ED via stretcher with complaints of mid sternum chest pain tightness radiating to left arm. No SOB. No Palpations. Patient seems calm at this time. VSS. High falls risk. Precautions in place. Will continue to monitor.

## 2015-06-18 NOTE — Procedures (Signed)
Stress test does not show ischemia. She is safe for [place] fromm CV standpoint.

## 2015-06-18 NOTE — UM Notes (Signed)
Self pay  Place (admit) for Observation Services (Order [id number]) 06/17/15 2331    [name] Cook is a 40 y.o. female with history of HTN and seizures p/w an episode of chest pain that she describes as tightness starting at 7:00 PM with associated diaphoresis, SOB, nausea, lightheadedness, and palpitations that she describes "beating so hard out of my chest." Pain worse with laying flat. Pt also reports intermittent left wrist pain over the last 2 months for which she has been taking Tylenol and wearing a brace without significant relief. Father with history of MI at age 56. Pt denies history of smoking.     Past Medical History   Diagnosis Date   . Hypertension    . Convulsions      BP 127/66 mmHg  Pulse 68  Temp(Src) 97.8 F (36.6 C) (Oral)  Resp 18  Wt 126.554 kg  SpO2 96%  LMP 05/28/2015    Temp:  [96.3 F (35.7 C)-97.8 F (36.6 C)]   Heart Rate:  [63-68]   Resp Rate:  [16-18]   BP: (105-153)/(63-79)   SpO2:  [92 %-98 %]   Weight:  [126.554 kg (279 lb)-126.916 kg (279 lb 12.8 oz)]     Last recorded pain score:  Pain Scale Used: Numeric Scale (0-10)  Pain Score: 8-severe pain     Lab Results last 48 Hours     Procedure Component Value Units Date/Time    Lipid panel [[id number]]  (Abnormal) Collected:  06/18/15 0025     HDL 36 (L) mg/dL     Comprehensive metabolic panel [[id number]]  (Abnormal) Collected:  06/18/15 0217     Albumin 3.4 (L) g/dL      Globulin 3.8 (H) g/dL     CBC with differential [[id number]]  (Abnormal) Collected:  06/18/15 0217     Hgb 8.9 (L) g/dL      Hematocrit 02.7 (L) %      RBC 3.83 (L) x10 6/uL      MCV 78.9 (L) fL      MCH 23.2 (L) pg      MCHC 29.5 (L) g/dL      RDW 17 (H) %     Comprehensive metabolic panel [[id number]]  (Abnormal) Collected:  06/17/15 2020     Protein, Total 8.4 (H) g/dL      Globulin 4.5 (H) g/dL     CBC and differential [[id number]]  (Abnormal) Collected:  06/17/15 2020     Hgb 9.9 (L) g/dL      Hematocrit 87.5 (L) %      RBC 4.19 (L) x10 6/uL      MCV 78.0 (L)  fL      MCH 23.6 (L) pg      MCHC 30.3 (L) g/dL      RDW 17 (H) %      Lab 06/17/15  2252 06/17/15  2020   TROPONIN I <0.01 <0.01              ED meds:  06/17/2015 2146 acetaminophen (TYLENOL) tablet 650 mg 650 mg Oral  06/17/2015 2147 alum & mag hydroxide-simethicone (MAALOX PLUS) 200-200-20 mg/5 mL suspension 30 mL 30 mL Oral    06/17/2015 2147 famotidine (PEPCID) tablet 20 mg 20 mg Oral    06/17/2015 2250 morphine injection 6 mg 6 mg Intravenous        Scheduled Meds:  Current Facility-Administered Medications   Medication Dose Route Frequency   . acetaminophen  650 mg Oral Q6H   . enoxaparin  40 mg Subcutaneous Daily   . labetalol  200 mg Oral TID   . levETIRAcetam  500 mg Oral Q12H SCH     Continuous Infusions:  . sodium chloride 100 mL/hr at 06/18/15 0109     PRN Meds:.acetaminophen, morphine IV x 2, naloxone, ondansetron     ASSESSMENT & PLAN    [name] Cook is a 40 y.o. female admitted under sound physicians with chest pain     Patient Active Hospital Problem List:  Chest pain (06/17/2015)  Hypertension   Seizures   Hypercholesteremia     - placed on PTA meds  - EKG reviewed, no significant ST segment changes  - troponin negative x2, serial troponins ordered  - labs: lipid profile, a1c ordered  - cardiology consulted by ED physician  - NPO at midnight     Nutrition  NPO     DVT/VTE Prophylaxis  Lovenox     [name] Cook, BSN, RN  Utilization Review Nurse  Case Management Dept  [facility]  [phone] [name] Staff Only)  [phone] (Car[employer])    Please submit all clinical review request via fax to [phone]

## 2015-06-18 NOTE — Progress Notes (Signed)
[facility]  PROGRESS NOTE      Patient: [name] Cook  Date: 06/18/2015   LOS:  Days  Admission Date: 06/17/2015   MRN: [id number]  Attending: Lattie Haw Vendela Troung  Please contact me on the following Pager [phone]       ASSESSMENT/PLAN     [name] Cook is a 40 y.o. female admitted with Chest pain    Interval Summary:     Patient Active Hospital Problem List:   Chest pain (06/17/2015)  - Troponin negative  - Stress test negative  - High di-dimer  - Will get V/Q scan(alergy to contrast)  - Give treatment dose Lovenox  - Continue pain meds and monitor VS  - Cardiology onboard    Hx of Seizures   - Continue Keppra          Analgesia: Morphine    Nutrition: Cardiac    DVT Prophylaxis:Lovenox       Code Status: Full    DISPO: TBD    Family Contact:          SUBJECTIVE     [name] Cook states She feel better but still have CP    MEDICATIONS     Current Facility-Administered Medications   Medication Dose Route Frequency   . acetaminophen  650 mg Oral Q6H   . enoxaparin  40 mg Subcutaneous Daily   . labetalol  200 mg Oral TID   . levETIRAcetam  500 mg Oral Q12H SCH       PHYSICAL EXAM     Filed Vitals:    06/18/15 1546   BP: 126/63   Pulse: 63   Temp: 95.8 F (35.4 C)   Resp: 16   SpO2: 97%       Temperature: Temp  Min: 95.8 F (35.4 C)  Max: 97.8 F (36.6 C)  Pulse: Pulse  Min: 63  Max: 68  Respiratory: Resp  Min: 16  Max: 18  Non-Invasive BP: BP  Min: 105/65  Max: 153/79  Pulse Oximetry SpO2  Min: 92 %  Max: 98 %    Intake and Output Summary (Last 24 hours) at Date Time    Intake/Output Summary (Last 24 hours) at 06/18/15 1556  Last data filed at 06/18/15 0555   Gross per 24 hour   Intake    690 ml   Output      0 ml   Net    690 ml         GEN APPEARANCE: Normal;  A&OX3  HEENT: PERLA; EOMI; Conjunctiva Clear  NECK: Supple; No bruits  CVS: RRR, S1, S2; No M/G/R  LUNGS: CTAB; No Wheezes; No Rhonchi: No rales  ABD: Soft; No TTP; + Normoactive BS  EXT: No edema; Pulses 2+ and intact  Skin exam:  pink  NEURO: CN 2-12 intact; No  Focal neurological deficits  CAP REFILL:  Normal  MENTAL STATUS:  Normal    LABS       Recent Labs  Lab 06/18/15  0217 06/17/15  2020   WBC 9.72 9.56   RBC 3.83* 4.19*   HGB 8.9* 9.9*   HEMATOCRIT 30.2* 32.7*   MCV 78.9* 78.0*   PLATELETS 163 180         Recent Labs  Lab 06/18/15  0217 06/17/15  2020   SODIUM 139 140   POTASSIUM 4.0 4.2   CHLORIDE 107 106   CO2 26 26   BUN 13.0 13.0   CREATININE 0.9 0.9   GLUCOSE 96  85   CALCIUM 8.5 9.4   MAGNESIUM 2.3  --          Recent Labs  Lab 06/18/15  0217 06/17/15  2020   ALT 10 12   AST (SGOT) 14 16   BILIRUBIN, TOTAL 0.6 0.8   ALBUMIN 3.4* 3.9   ALKALINE PHOSPHATASE 80 94         Recent Labs  Lab 06/18/15  0217 06/17/15  2252 06/17/15  2020   TROPONIN I <0.01 <0.01 <0.01             Microbiology Results     None           RADIOLOGY     Upon my review:    Signed,  Jermiah Soderman K Debrina Kizer  3:56 PM 06/18/2015

## 2015-06-18 NOTE — Plan of Care (Signed)
Problem: Safety  Goal: Patient will be free from injury during hospitalization  Outcome: Progressing  Patient is alert and oriented x 4. Family at the bedside. VSS. Patient on room air. Telemetry NSR. High falls risk. Per patient had 2 falls in the last month. Hourly rounding in place.     Problem: Pain  Goal: Patient's pain/discomfort is manageable  Outcome: Progressing  Patient admitted with chest pain. Upon arrival to unit pain 9/10 mid chest tightness radiating to arm. Patient appears to be calm BP 105/65 mmHg  Pulse 68  Temp(Src) 96.3 F (35.7 C) (Oral)  Resp 18  Wt 126.554 kg (279 lb)  SpO2 97%  LMP 05/28/2015 Morphine iv given with some relief. Patient on telemetry NSR. Troponin negative x 3. Will continue to monitor.

## 2015-06-18 NOTE — Consults (Signed)
[facility] CONSULTATION REPORT    Date Time: 06/18/2015 1:54 PM  Patient Name: [name] Cook  Requesting Physician: Berenice Bouton, MD       Assessment:   Chest pain , non exertional  Dyslipidemia          Plan:   Will plan for stress test and if negative , safe for [place] later today.               Signed by: Fortino Sic, MD  OFFICE [phone]  MD LINE [phone]      Reason for Consultation:   Chest pain      History:   [name] Cook is a 40 y.o. female who presents to the hospital on 06/17/2015 with chest pain   relavent cardiac history hyperlipidemia    Past Medical History:     Past Medical History   Diagnosis Date   . Hypertension    . Convulsions        Past Surgical History:   History reviewed. No pertinent past surgical history.    Family History:   History reviewed. No pertinent family history.  No premature CAD   Social History:     Social History     Social History   . Marital Status: Single     Spouse Name: N/A   . Number of Children: N/A   . Years of Education: N/A     Social History Main Topics   . Smoking status: Never Smoker    . Smokeless tobacco: Not on file   . Alcohol Use: No   . Drug Use: No   . Sexual Activity: Not on file     Other Topics Concern   . Not on file     Social History Narrative       Allergies:     Allergies   Allergen Reactions   . Iodine    . Percocet [Oxycodone-Acetaminophen]    . Shellfish-Derived Products    . Toradol [Ketorolac Tromethamine]    . Tramadol        Medications:     Prescriptions prior to admission   Medication Sig   . LABETALOL HCL PO Take 200 mg by mouth 3 (three) times daily.      . LevETIRAcetam (KEPPRA PO) Take 500 mg by mouth 2 (two) times daily.                     @cme @       Review of Systems:   Comprehensive review of systems including  ++ constitutional, eyes, ears, nose, mouth, throat, ++ cardiovascular, GI, GU, musculoskeletal, integumentary, respiratory, neurologic, psychiatric, and endocrine is negative other than what is  mentioned already in the history of present illness    Physical Exam:     Filed Vitals:    06/18/15 0641   BP: 134/71   Pulse: 63   Temp: 97.4 F (36.3 C)   Resp: 16   SpO2: 92%     Temp (24hrs), Avg:97.3 F (36.3 C), Min:96.3 F (35.7 C), Max:97.8 F (36.6 C)      Intake and Output Summary (Last 24 hours) at Date Time    Intake/Output Summary (Last 24 hours) at 06/18/15 1354  Last data filed at 06/18/15 0555   Gross per 24 hour   Intake    690 ml   Output      0 ml   Net    690 ml  GENERAL: Patient is in no acute distress , overweight  HEENT: No scleral icterus or conjunctival pallor, moist mucous membranes   NECK: No jugular venous distention or thyromegaly, normal carotid upstrokes without bruits   CARDIAC: Normal apical impulse, regular rate and rhythm, with normal S1 and S2, and no murmurs, rubs, or gallops   CHEST: Clear to auscultation bilaterally, normal respiratory effort  ABDOMEN: No abdominal bruits, masses, or hepatosplenomegaly, nontender, non-distended, good bowel sounds   EXTREMITIES: No clubbing, cyanosis, or edema, 2+ DP, PT, and femoral pulses bilaterally without bruits SKIN: No rash or jaundice   NEUROLOGIC: Alert and oriented to time, place and person, normal mood and affect MUSCULOSKELETAL: Normal muscle strength and tone.      Labs Reviewed:   Recent CMP   Recent Labs      06/18/15   0217   GLUCOSE  96   BUN  13.0   CREATININE  0.9   SODIUM  139   CO2  26   CALCIUM  8.5   ALBUMIN  3.4*   AST (SGOT)  14   ALT  10   GLOBULIN  3.8*     Recent CARDIAC ENZYMES No results for input(s): TROPONIN, ISTATTROPONI, CK in the last 24 hours.    Invalid input(s): TROPONINT, CKMB[24  Recent TSH Invalid input(s): TSH[24  Recent CBC WITH DIFF Recent Labs      06/18/15   0217   RBC  3.83*   HGB  8.9*   HEMATOCRIT  30.2*   MCV  78.9*   MCHC  29.5*   RDW  17*   MPV  Unmeasured   PLATELETS  163     Recent LFT Recent Labs      06/18/15   0217   AST (SGOT)  14   ALT  10   ALBUMIN  3.4*   GLOBULIN  3.8*      Recent LIPID PANEL Recent Labs      06/18/15   0025   CHOLESTEROL  140   TRIGLYCERIDES  83   HDL  36*     Recent ABG No results for input(s): TEMP, FIO2, RATE, MODE, ETCO2, PEEP in the last 24 hours.    Invalid input(s): APH, APCO2, APO2, AHCO3, ATCO2, ABE, AOSAT, ABGS, ALLEN, STATS, O2DEL, O2FLO, PRESS, VNTMN, PRSUP, TIVOL[24        Rads:   Radiological Procedure reviewed.      chest X-ray  ECG-nsr , wnl

## 2015-06-18 NOTE — Plan of Care (Signed)
Problem: Chest Pain  Goal: Vital signs and cardiac rhythm stable  Outcome: Progressing  Pt c/o chest pain, pressure and shooting to L shoulder, received morphine with some relief. 24hr Tele shows NSR, cardiac stress test done negative. D-dimer elevated, ordered lung ventilation perfusion test. Urine pregnancy done and result was negative.  Plan: follow up with test result. Pain management.

## 2015-06-19 ENCOUNTER — Observation Stay: Payer: Medicaid Other

## 2015-06-19 LAB — COMPREHENSIVE METABOLIC PANEL
ALT: 14 U/L (ref 0–55)
AST (SGOT): 18 U/L (ref 5–34)
Albumin/Globulin Ratio: 0.9 (ref 0.9–2.2)
Albumin: 3.5 g/dL (ref 3.5–5.0)
Alkaline Phosphatase: 84 U/L (ref 37–106)
Anion Gap: 9 (ref 5.0–15.0)
BUN: 12 mg/dL (ref 7.0–19.0)
Bilirubin, Total: 0.5 mg/dL (ref 0.2–1.2)
CO2: 22 mEq/L (ref 22–29)
Calcium: 8.8 mg/dL (ref 8.5–10.5)
Chloride: 108 mEq/L (ref 100–111)
Creatinine: 0.8 mg/dL (ref 0.6–1.0)
Globulin: 4 g/dL — ABNORMAL HIGH (ref 2.0–3.6)
Glucose: 91 mg/dL (ref 70–100)
Potassium: 4.4 mEq/L (ref 3.5–5.1)
Protein, Total: 7.5 g/dL (ref 6.0–8.3)
Sodium: 139 mEq/L (ref 136–145)

## 2015-06-19 LAB — CBC AND DIFFERENTIAL
Basophils Absolute Automated: 0.03 10*3/uL (ref 0.00–0.20)
Basophils Automated: 0 %
Eosinophils Absolute Automated: 0.26 10*3/uL (ref 0.00–0.70)
Eosinophils Automated: 3 %
Hematocrit: 32.2 % — ABNORMAL LOW (ref 37.0–47.0)
Hgb: 9.6 g/dL — ABNORMAL LOW (ref 12.0–16.0)
Immature Granulocytes Absolute: 0.01 10*3/uL
Immature Granulocytes: 0 %
Lymphocytes Absolute Automated: 2.83 10*3/uL (ref 0.50–4.40)
Lymphocytes Automated: 31 %
MCH: 23.4 pg — ABNORMAL LOW (ref 28.0–32.0)
MCHC: 29.8 g/dL — ABNORMAL LOW (ref 32.0–36.0)
MCV: 78.3 fL — ABNORMAL LOW (ref 80.0–100.0)
Monocytes Absolute Automated: 0.6 10*3/uL (ref 0.00–1.20)
Monocytes: 7 %
Neutrophils Absolute: 5.29 10*3/uL (ref 1.80–8.10)
Neutrophils: 59 %
Nucleated RBC: 0 /100 WBC (ref 0–1)
Platelets: 175 10*3/uL (ref 140–400)
RBC: 4.11 10*6/uL — ABNORMAL LOW (ref 4.20–5.40)
RDW: 17 % — ABNORMAL HIGH (ref 12–15)
WBC: 9.01 10*3/uL (ref 3.50–10.80)

## 2015-06-19 LAB — HEMOLYSIS INDEX: Hemolysis Index: 14 (ref 0–18)

## 2015-06-19 LAB — GFR: EGFR: >60

## 2015-06-19 MED ORDER — LIDOCAINE 5 % EX PTCH
1.0000 | MEDICATED_PATCH | Freq: Every day | CUTANEOUS | Status: DC
Start: 2015-06-19 — End: 2018-09-06

## 2015-06-19 MED ORDER — LIDOCAINE 5 % EX PTCH
1.0000 | MEDICATED_PATCH | CUTANEOUS | Status: DC
Start: 2015-06-19 — End: 2015-06-19
  Administered 2015-06-19: 1 via TRANSDERMAL
  Filled 2015-06-19: qty 1

## 2015-06-19 NOTE — Discharge Summary (Signed)
[facility]      Patient: [name] Cook  Admission Date: 06/17/2015   DOB: Jun 23, 1975  Discharge Date: 06/19/2015    MRN: [id number]  Discharge Attending:Echo Allsbrook K Marnell Mcdaniel     Referring Physician: No primary care provider on file.  PCP: No primary care provider on file.       DISCHARGE SUMMARY     Discharge Information   Admission Diagnosis:   Chest pain    Discharge Diagnosis:   Active Hospital Problems    Diagnosis   . Precordial pain   . Chest pain        Admission Condition: Chest pain  Discharge Condition: Stable  Consultants: Cardiology  Functional Status: Independent  Discharged to: Home    Discharge Medications:     Medication List      START taking these medications          lidocaine 5 %   Commonly known as:  LIDODERM   Place 1 patch onto the skin daily. Remove & Discard patch within 12 hours or as directed by MD         CONTINUE taking these medications          KEPPRA PO       LABETALOL HCL PO            Where to Get Your Medications      You can get these medications from any pharmacy     Bring a paper prescription for each of these medications    - lidocaine 5 %              Hospital Course   Presentation History   Pt was admitted with chest pain    See HPI for details.    Hospital Course ( Days)     Pt was admitted with chest pain, troponin was negative, cardiology was consulted and stress test was done and negative also V/Q scan was done to R/O PE and came back normal, Pt feel better and after cleared by cardiology she was discharged home        Procedures/Imaging:   Upon my review: Nm Lung Ventilation Perfusion    06/18/2015   Normal study. [name] Slimmer, MD 06/18/2015 9:12 PM     Xr Chest Ap Portable    06/17/2015    No acute cardiopulmonary process. Wyatt Portela, MD 06/17/2015 8:50 PM            Best Practices   Was the patient admitted with either a CHF Exacerbation or Pneumonia? No     Progress Note/Physical Exam at Discharge     Subjective: Pt is A&O x3 in no acute distress    Filed Vitals:    06/18/15  1546 06/18/15 2214 06/19/15 0559 06/19/15 0745   BP: 126/63 131/68  96/53   Pulse: 63 64  58   Temp: 95.8 F (35.4 C) 95.8 F (35.4 C)  97.6 F (36.4 C)   TempSrc: Oral Oral  Oral   Resp: 16 16  18    Weight:   122.879 kg (270 lb 14.4 oz)    SpO2: 97% 99%  95%           General: NAD, AAOx3  HEENT: perrla, eomi, sclera anicteric, OP: Clear, MMM  Neck: supple, FROM, no LAD  Cardiovascular: RRR, no m/r/g  Lungs: CTAB, no w/r/r  Abdomen: soft, +BS, NT/ND, no masses, no g/r  Extremities: no C/C/E  Skin: no rashes or lesions noted  Neuro:  CN 2-12 intact; No Focal neurological deficits       Diagnostics     Labs/Studies Pending at Discharge: No    Last Labs     Recent Labs  Lab 06/19/15  0439 06/18/15  0217 06/17/15  2020   WBC 9.01 9.72 9.56   RBC 4.11* 3.83* 4.19*   HGB 9.6* 8.9* 9.9*   HEMATOCRIT 32.2* 30.2* 32.7*   MCV 78.3* 78.9* 78.0*   PLATELETS 175 163 180         Recent Labs  Lab 06/19/15  0439 06/18/15  0217 06/17/15  2020   SODIUM 139 139 140   POTASSIUM 4.4 4.0 4.2   CHLORIDE 108 107 106   CO2 22 26 26    BUN 12.0 13.0 13.0   CREATININE 0.8 0.9 0.9   GLUCOSE 91 96 85   CALCIUM 8.8 8.5 9.4   MAGNESIUM  --  2.3  --              Microbiology Results     None           Patient Instructions   Discharge Diet: Regular  Discharge Activity: as able    Follow Up Appointment:  Follow-up Information     Follow up with [facility]. Schedule an appointment as soon as possible for a visit in 1 week.    Contact information:    [address]  [address]  [place] [address]  [phone]           Time spent examining patient, discussing with patient/family regarding hospital course, chart review, reconciling medications and discharge planning: >30 minutes.    Ailene Ravel Akshar Starnes    12:55 PM 06/19/2015

## 2015-06-19 NOTE — Discharge Instr - Activity (Signed)
As tolerated

## 2015-06-19 NOTE — Progress Notes (Signed)
Pt alert/oriented x4. no sob or discomfort. No nausea/vomiting reported. VSS. Ate 100% breakfast. Pt received discharge instruction. Lidocaine patch is applied and pt educated on lidocaine patch benefit and how to apply. Pt verbalized understanding. Pt took all belonging. Pt stable at time of discharge.

## 2015-06-19 NOTE — Plan of Care (Signed)
Problem: Chest Pain  Goal: Vital signs and cardiac rhythm stable  Outcome: Progressing  Pt alert/oriented x4. Pt c/o chest discomfort but stated she is felling better today. VSS. 98% on RA. Trop x 3 neg. Stress test neg. S/P lung ventilation perfusion test, result WNL. Will continue to monitor closely.

## 2015-06-19 NOTE — Discharge Instr - Diet (Addendum)
regular

## 2015-06-19 NOTE — Plan of Care (Signed)
Pt will discharge home toda

## 2015-06-19 NOTE — Plan of Care (Signed)
Problem: Chest Pain  Goal: Vital signs and cardiac rhythm stable  Outcome: Progressing  Pt is A/O x 4, still complains of chest pain. PRN Morphine given as ordered with minimal relief. V/S stable, no SOB noted. 99% on RA. Trop x 3 neg. Stress test neg. D-Dimer elevated. S/P lung ventilation perfusion test, result WNL. Needs chest x ray in AM. On 24 hour tele, NSR, discontinued per protocol. [place] per cardiology. Will continue plan of care and manage as needed  Intervention: Monitor/assess vital signs/cardiac rhythm.  .  Intervention: Assess tolerance of Room Air.  Marland Kitchen

## 2015-06-19 NOTE — Progress Notes (Signed)
[facility] No CM Needs     Madelyn Brunner, MSW   Ext: [phone]        06/19/15 0930   Discharge Disposition   Physical Discharge Disposition Home, No Needs   Patient/Family/POA notified of transfer plan Patient informed only   Patient agreeable to discharge plan/expected d/c date? Yes   Bedside nurse notified of transport plan? Yes   CM Interventions   Multidisciplinary rounds/family meeting before d/c? No   Medicare Checklist   Is this a Medicare patient? No

## 2015-06-19 NOTE — Discharge Instructions (Signed)
[facility] DISCHARGE INSTRUCTIONS     Date of Admission: 06/17/2015    Date of Discharge: 06/19/2015    Discharge Physician: Lattie Haw [facility]    You were admitted to [facility] for  Chest pain. It is important that you make your follow up appointments listed below and take your medications as prescribed.      If you are unable to obtain an appointment, unable to obtain newly prescribed medications, or are unclear about any of your discharge instructions please contact your discharging physician at [phone] (M-F, 8am-3pm) or weekends and after hours via the hospital operator [phone], hospital case manager, or your primary care physician.    Follow up Appointments  Follow-up Information     Follow up with [facility]. Schedule an appointment as soon as possible for a visit in 1 week.    Contact information:    [address]  [address]  [place] [address]  [phone]          Consultants: Cardiology    Pending Studies: None    Further Instructions: F/U with PCP    Discharge Diet: Regular    Surgery/Procedure: Stress test          Noncardiac Chest Pain    Based on your visit today, the health care provider doesn't know what is causing your chest pain. In most cases, people who come to the emergency department with chest pain don't have a problem with their heart. Instead, the pain is caused by other conditions. These may be problems with the lungs, muscles, bones, digestive tract, nerves, or mental health.  Lung problems   Inflammation around the lungs (pleurisy)   Collapsed lung (pneumothorax)   Fluid around the lungs (pleural effusion)   Lung cancer. This is a rare cause of chest pain.  Muscle or bone problems   Inflamed cartilage between the ribs (pleurisy)   Fibromyalgia   Rheumatoid arthritis  Digestive system problems   Reflux   Stomach ulcer   Spasms of the esophagus   Gall stones   Gallbladder inflammation  Mental  health conditions   Panic or anxiety attacks   Emotional distress  Your condition doesn't seem serious and your pain doesn't appear to be coming from your heart. But sometimes the signs of a serious problem take more time to appear. Watch for the warning signs listed below.  Home care  Follow these guidelines when caring for yourself at home:   Rest today and avoid strenuous activity.   Take any prescribed medicine as directed.  Follow-up care  Follow up with your health care provider, or as advised, if you don't start to feel better within 24 hours.  When to seek medical advice  Call your health care provider right away if any of these occur:   A change in the type of pain. Call if it feels different, becomes more serious, lasts longer, or begins to spread into your shoulder, arm, neck, jaw, or back.   Shortness of breath   You feel more pain when you breathe   Cough with dark-colored mucus or blood   Weakness, dizziness, or fainting   Fever of 100.62F (38C) or higher, or as directed by your health care provider   Swelling, pain, or redness in one leg  Date Last Reviewed: 02/03/2013   2000-2016 [employer] [address] [place], [place] [address]. All rights reserved. This information is not intended as a substitute  for professional medical care. Always follow your healthcare professional's instructions.

## 2015-06-20 LAB — ECG 12-LEAD
Atrial Rate: 65 {beats}/min
Atrial Rate: 68 {beats}/min
P Axis: 39 degrees
P Axis: 32 degrees
P-R Interval: 146 ms
P-R Interval: 160 ms
Q-T Interval: 436 ms
Q-T Interval: 436 ms
QRS Duration: 88 ms
QRS Duration: 90 ms
QTC Calculation (Bezet): 453 ms
QTC Calculation (Bezet): 463 ms
R Axis: 29 degrees
R Axis: 16 degrees
T Axis: 10 degrees
T Axis: 6 degrees
Ventricular Rate: 65 {beats}/min
Ventricular Rate: 68 {beats}/min

## 2015-07-23 ENCOUNTER — Emergency Department
Admission: EM | Admit: 2015-07-23 | Discharge: 2015-07-24 | Disposition: A | Discharge status: Home / Self Care | Payer: Medicaid Other | Attending: Emergency Medicine | Admitting: Emergency Medicine

## 2015-07-23 ENCOUNTER — Emergency Department: Payer: Medicaid Other

## 2015-07-23 DIAGNOSIS — R0781 Pleurodynia: Secondary | ICD-10-CM | POA: Insufficient documentation

## 2015-07-23 DIAGNOSIS — R002 Palpitations: Secondary | ICD-10-CM | POA: Insufficient documentation

## 2015-07-23 DIAGNOSIS — I10 Essential (primary) hypertension: Secondary | ICD-10-CM | POA: Insufficient documentation

## 2015-07-23 DIAGNOSIS — D509 Iron deficiency anemia, unspecified: Secondary | ICD-10-CM | POA: Insufficient documentation

## 2015-07-23 DIAGNOSIS — F419 Anxiety disorder, unspecified: Secondary | ICD-10-CM | POA: Insufficient documentation

## 2015-07-23 DIAGNOSIS — R079 Chest pain, unspecified: Secondary | ICD-10-CM | POA: Insufficient documentation

## 2015-07-23 LAB — CBC AND DIFFERENTIAL
Basophils Absolute Automated: 0.03 10*3/uL (ref 0.00–0.20)
Basophils Automated: 0 %
Eosinophils Absolute Automated: 0.16 10*3/uL (ref 0.00–0.70)
Eosinophils Automated: 2 %
Hematocrit: 31.8 % — ABNORMAL LOW (ref 37.0–47.0)
Hgb: 9.6 g/dL — ABNORMAL LOW (ref 12.0–16.0)
Immature Granulocytes Absolute: 0.02 10*3/uL
Immature Granulocytes: 0 %
Lymphocytes Absolute Automated: 2.58 10*3/uL (ref 0.50–4.40)
Lymphocytes Automated: 26 %
MCH: 23.2 pg — ABNORMAL LOW (ref 28.0–32.0)
MCHC: 30.2 g/dL — ABNORMAL LOW (ref 32.0–36.0)
MCV: 77 fL — ABNORMAL LOW (ref 80.0–100.0)
Monocytes Absolute Automated: 0.52 10*3/uL (ref 0.00–1.20)
Monocytes: 5 %
Neutrophils Absolute: 6.52 10*3/uL (ref 1.80–8.10)
Neutrophils: 66 %
Nucleated RBC: 0 /100 WBC (ref 0–1)
Platelets: 162 10*3/uL (ref 140–400)
RBC: 4.13 10*6/uL — ABNORMAL LOW (ref 4.20–5.40)
RDW: 17 % — ABNORMAL HIGH (ref 12–15)
WBC: 9.81 10*3/uL (ref 3.50–10.80)

## 2015-07-23 LAB — URINE BHCG POC: Urine bHCG POC: NEGATIVE

## 2015-07-23 LAB — URINALYSIS, REFLEX TO MICROSCOPIC EXAM IF INDICATED
Bilirubin, UA: NEGATIVE
Blood, UA: NEGATIVE
Glucose, UA: NEGATIVE
Ketones UA: NEGATIVE
Nitrite, UA: NEGATIVE
Protein, UR: NEGATIVE
Specific Gravity UA: 1.017 (ref 1.001–1.035)
Urine pH: 6 (ref 5.0–8.0)
Urobilinogen, UA: 2 mg/dL (ref 0.2–2.0)

## 2015-07-23 LAB — HEMOLYSIS INDEX: Hemolysis Index: 7 (ref 0–18)

## 2015-07-23 LAB — BASIC METABOLIC PANEL
Anion Gap: 8 (ref 5.0–15.0)
BUN: 11 mg/dL (ref 7.0–19.0)
CO2: 23 mEq/L (ref 22–29)
Calcium: 9.3 mg/dL (ref 8.5–10.5)
Chloride: 108 mEq/L (ref 100–111)
Creatinine: 1 mg/dL (ref 0.6–1.0)
Glucose: 86 mg/dL (ref 70–100)
Potassium: 3.7 mEq/L (ref 3.5–5.1)
Sodium: 139 mEq/L (ref 136–145)

## 2015-07-23 LAB — IHS D-DIMER: D-Dimer: 0.81 ug/mL FEU — ABNORMAL HIGH (ref 0.00–0.51)

## 2015-07-23 LAB — GFR: EGFR: >60

## 2015-07-23 LAB — TROPONIN I
Troponin I: <0.01 ng/mL (ref 0.00–0.09)
Troponin I: <0.01 ng/mL (ref 0.00–0.09)

## 2015-07-23 MED ORDER — MORPHINE SULFATE 4 MG/ML IJ/IV SOLN (WRAP)
4.0000 mg | Status: DC | PRN
Start: 2015-07-23 — End: 2015-07-24
  Administered 2015-07-23: 4 mg via INTRAVENOUS
  Filled 2015-07-23: qty 1

## 2015-07-23 MED ORDER — TECHNETIUM TC 99M PENTETATE AEROSOL
51.1000 | Freq: Once | Status: AC | PRN
Start: 2015-07-23 — End: 2015-07-23
  Administered 2015-07-23: 51.1 via RESPIRATORY_TRACT

## 2015-07-23 MED ORDER — ONDANSETRON HCL 4 MG/2ML IJ SOLN
4.0000 mg | Freq: Once | INTRAMUSCULAR | Status: AC
Start: 2015-07-23 — End: 2015-07-23
  Administered 2015-07-23: 4 mg via INTRAVENOUS
  Filled 2015-07-23: qty 2

## 2015-07-23 MED ORDER — ALUM & MAG HYDROXIDE-SIMETH 200-200-20 MG/5ML PO SUSP
30.0000 mL | Freq: Once | ORAL | Status: AC
Start: 2015-07-23 — End: 2015-07-23
  Administered 2015-07-23: 30 mL via ORAL
  Filled 2015-07-23: qty 30

## 2015-07-23 MED ORDER — LIDOCAINE VISCOUS 2 % MT SOLN
10.0000 mL | Freq: Once | OROMUCOSAL | Status: AC
Start: 2015-07-23 — End: 2015-07-23
  Administered 2015-07-23: 10 mL via OROMUCOSAL
  Filled 2015-07-23: qty 15

## 2015-07-23 MED ORDER — LORAZEPAM 2 MG/ML IJ SOLN
1.0000 mg | Freq: Once | INTRAMUSCULAR | Status: AC
Start: 2015-07-23 — End: 2015-07-23
  Administered 2015-07-23: 1 mg via INTRAVENOUS
  Filled 2015-07-23: qty 1

## 2015-07-23 MED ORDER — TECHNETIUM TC 99M ALBUMIN AGGREGATED
6.0000 | Freq: Once | Status: AC | PRN
Start: 2015-07-23 — End: 2015-07-23
  Administered 2015-07-23: 6 via INTRAVENOUS

## 2015-07-23 NOTE — ED Notes (Signed)
Pt comes in c/o chest pain that started s/p waking up ~1600.  Pt states that she felt her heart racing got dizzy and almost had a syncopal episode.  Son held her and guided her to the floor.  -LOC.  Pt obs AAOx4 at this time and MAEx4.  +N -V.

## 2015-07-23 NOTE — ED Provider Notes (Signed)
EMERGENCY DEPARTMENT HISTORY AND PHYSICAL EXAM     Physician/Midlevel provider first contact with patient: 07/23/15 2003         Date: 07/23/2015  Patient Name: [name] Cook    History of Presenting Illness     Chief Complaint   Patient presents with   . Chest Pain   . Hypertension   . Dizziness       History Provided By: Patient    Chief Complaint: chest pain  Onset: 2 hours PTA  Timing: Constant  Severity: Moderate  Exacerbating factors: worse with breathing  Alleviating factors: none  Associated Symptoms: palpitations, dizziness, nausea, vomiting, diarrhea, near syncope, fall, SOB  Pertinent Negatives: fever, cough, tobacco use, recent travel, recent long travel, blurry vision, double vision, myalgias, focal extremity weakness, cough, dysuria,    Additional History: [name] Cook is a 40 y.o. female with a h/o HTN  presenting to the ED with constant substernal chest pain and palpitationsx 2 hours when she woke up from her nap.  Chest pain is worse with breathing, denies alleviating factors. Then she noticed SOB. Feels like she can't take in a deep breath. After she woke up from her nap, + nausea, vomiting x 3 and diarrhea x 3.  Then became lightheaded, worse when she stands, and had a syncopal episode. FHx dad MI at 50 and DVT. Pt denies any fever, cough, tobacco use, recent travel, recent long travel, blurred vision, double vision, myalgias, focal extremity weakness, cough, blood in vomit or stool, 0dysuria, or any other complaints at this time.    + compliance w/ meds. LMP April 17.      PCP: No primary care provider on file.  SPECIALISTS: Cards Rajan    Current Facility-Administered Medications   Medication Dose Route Frequency Provider Last Rate Last Dose   . LORazepam (ATIVAN) injection 1 mg  1 mg Intravenous Once Annett Fabian, MD         Current Outpatient Prescriptions   Medication Sig Dispense Refill   . LABETALOL HCL PO Take 200 mg by mouth 3 (three) times daily.        . LevETIRAcetam (KEPPRA PO) Take 500  mg by mouth 2 (two) times daily.        Marland Kitchen lidocaine (LIDODERM) 5 % Place 1 patch onto the skin daily. Remove & Discard patch within 12 hours or as directed by MD 6 each 0       Past History     Past Medical History:  Past Medical History   Diagnosis Date   . Hypertension    . Convulsions        Past Surgical History:  noncontributory    Family History:  Family History   Problem Relation Age of Onset   . Myocardial Infarction Father 62   . Deep vein thrombosis Father        Social History:  Social History   Substance Use Topics   . Smoking status: Never Smoker    . Smokeless tobacco: Not on file   . Alcohol Use: No       Allergies:  Allergies   Allergen Reactions   . Iodine    . Percocet [Oxycodone-Acetaminophen]    . Shellfish-Derived Products    . Toradol [Ketorolac Tromethamine]    . Tramadol        Review of Systems     Review of Systems   Constitutional: Negative for fever and diaphoresis.   HENT: Negative for hearing loss.  Eyes: Negative for blurred vision, double vision, discharge and redness.   Respiratory: Positive for shortness of breath. Negative for cough, wheezing and stridor.    Cardiovascular: Positive for chest pain and palpitations. Negative for leg swelling.   Gastrointestinal: Positive for nausea, vomiting and diarrhea. Negative for abdominal pain.   Genitourinary: Negative for dysuria and flank pain.   Musculoskeletal: Positive for falls. Negative for myalgias.   Neurological: Positive for dizziness and loss of consciousness. Negative for focal weakness.   Psychiatric/Behavioral: Negative for memory loss and substance abuse.         Physical Exam   BP 158/80 mmHg  Pulse 64  Temp(Src) 98.3 F (36.8 C) (Oral)  Resp 16  Wt 125.193 kg  SpO2 100%  LMP 06/28/2015    Physical Exam   Constitutional: She is oriented to person, place, and time. She appears well-developed and well-nourished. No distress.   Extremely elevated BMI   HENT:   Head: Normocephalic and atraumatic.   Mouth/Throat:  Oropharynx is clear and moist.   Eyes: Conjunctivae are normal. Right eye exhibits no discharge. Left eye exhibits no discharge. No scleral icterus.   Neck: Neck supple. No JVD present. No tracheal deviation present.   Cardiovascular: Normal rate, regular rhythm, normal heart sounds and intact distal pulses.    No murmur heard.  1+ b/l radial pulses   Pulmonary/Chest: Breath sounds normal. No stridor. No respiratory distress. She has no wheezes. She exhibits no tenderness.   Abdominal: Soft. She exhibits no distension and no mass. There is no tenderness. There is no rebound and no guarding.   Rotund abd, not peritoneal   Musculoskeletal: Normal range of motion. She exhibits no edema or tenderness.   Neurological: She is alert and oriented to person, place, and time. She exhibits normal muscle tone. Coordination normal.   Skin: Skin is warm and dry. She is not diaphoretic. No pallor.   Psychiatric: She has a normal mood and affect. Her behavior is normal.   Nursing note and vitals reviewed.        Diagnostic Study Results     Labs -     Results     Procedure Component Value Units Date/Time    Troponin I [[id number]] Collected:  07/23/15 2218    Specimen Information:  Blood Updated:  07/23/15 2218    Troponin I [[id number]] Collected:  07/23/15 2101    Specimen Information:  Blood Updated:  07/23/15 2137     Troponin I <0.01 ng/mL     Urine Culture [[id number]] Collected:  07/23/15 2103    Specimen Information:  Urine from Urine, Clean Catch Updated:  07/23/15 2137    Urine BHCG POC [[id number]] Collected:  07/23/15 2122     Urine bHCG POC Negative Updated:  07/23/15 2129    UA, Reflex to Microscopic (pts 3 + yrs) [[id number]]  (Abnormal) Collected:  07/23/15 2103    Specimen Information:  Urine Updated:  07/23/15 2129     Urine Type Clean Catch      Color, UA Yellow      Clarity, UA Clear      Specific Gravity UA 1.017      Urine pH 6.0      Leukocyte Esterase, UA Small (A)      Nitrite, UA Negative      Protein, UR  Negative      Glucose, UA Negative      Ketones UA Negative      Urobilinogen, UA 2.0 mg/dL  Bilirubin, UA Negative      Blood, UA Negative      RBC, UA 0 - 2 /hpf      WBC, UA 0 - 5 /hpf      Squamous Epithelial Cells, Urine 0 - 5 /hpf     GFR [[id number]] Collected:  07/23/15 2101     EGFR >60.0 Updated:  07/23/15 2128    Basic Metabolic Panel [[id number]] Collected:  07/23/15 2101    Specimen Information:  Blood Updated:  07/23/15 2128     Glucose 86 mg/dL      BUN 18.8 mg/dL      Creatinine 1.0 mg/dL      Calcium 9.3 mg/dL      Sodium 416 mEq/L      Potassium 3.7 mEq/L      Chloride 108 mEq/L      CO2 23 mEq/L      Anion Gap 8.0     Hemolysis index [[id number]] Collected:  07/23/15 2101     Hemolysis Index 7 Updated:  07/23/15 2128    D-Dimer [[id number]]  (Abnormal) Collected:  07/23/15 2101     D-Dimer 0.81 (H) ug/mL FEU Updated:  07/23/15 2119    CBC with differential [[id number]]  (Abnormal) Collected:  07/23/15 2101    Specimen Information:  Blood from Blood Updated:  07/23/15 2118     WBC 9.81 x10 3/uL      Hgb 9.6 (L) g/dL      Hematocrit 62.3 (L) %      Platelets 162 x10 3/uL      RBC 4.13 (L) x10 6/uL      MCV 77.0 (L) fL      MCH 23.2 (L) pg      MCHC 30.2 (L) g/dL      RDW 17 (H) %      MPV Unmeasured fL      Neutrophils 66 %      Lymphocytes Automated 26 %      Monocytes 5 %      Eosinophils Automated 2 %      Basophils Automated 0 %      Immature Granulocyte 0 %      Nucleated RBC 0 /100 WBC      Neutrophils Absolute 6.52 x10 3/uL      Abs Lymph Automated 2.58 x10 3/uL      Abs Mono Automated 0.52 x10 3/uL      Abs Eos Automated 0.16 x10 3/uL      Absolute Baso Automated 0.03 x10 3/uL      Absolute Immature Granulocyte 0.02 x10 3/uL           Radiologic Studies -   Radiology Results (24 Hour)     Procedure Component Value Units Date/Time    [place] VenoDopp Low Extremity Bilateral [[id number]] Resulted:  07/23/15 2149    Order Status:  Sent Updated:  07/23/15 2150    XR Chest  AP Portable [[id number]]  Collected:  07/23/15 2043    Order Status:  Completed Updated:  07/23/15 2048    Narrative:      Indication:CP, SOB    Technique: A portable frontal view of the chest was performed.     Comparison:Exam performed 06/17/2015    Findings:  No focal consolidation, effusion or pneumothorax is apparent.  Heart and mediastinal contours are unchanged. Skeletal structures appear  normal.      Impression:       Stable exam, without new acute cardiopulmonary process.  Orson Gear, MD   07/23/2015 8:43 PM        .    Medical Decision Making   I am the first provider for this patient.    I reviewed the vital signs, available nursing notes, past medical history, past surgical history, family history and social history.    Vital Signs-Reviewed the patient's vital signs.     Patient Vitals for the past 12 hrs:   BP Temp Pulse Resp   07/23/15 2148 158/80 mmHg - - -   07/23/15 1950 (!) 201/105 mmHg 98.3 F (36.8 C) 64 16       Pulse Oximetry Analysis - Normal 100% on RA    Cardiac Monitor:  Rate: 70  Rhythm:  Normal Sinus Rhythm     EKG:  Interpreted by the EP.   Time Interpreted:    Rate: 70   Rhythm: Normal Sinus Rhythm    Interpretation: TWI V1, III. QTc 481   Comparison: 06/17/15 similar    Procedures:    Clinical Decision Support:       Old Medical Records: Old medical records.  Previous electrocardiograms.  Nursing notes.     4/8/ 2017 - elevated D dimer, Negative VQ, trop negative. Stress test showed no ischemic changes  Hemoglobin unchanged today, from 4/8    ED Course:     8:11 PM -  Discussed ED plan with the pt, the pt agrees with the plan.  The pt follows with Dr.Keesha (PCP) in [place].    8:20 PM - The pt states that her symptoms today don't feel similar to her past symptoms, but are in fact worse.     Slightly better after GI cocktail.     10:00 PM - Updated the pt on all of her results thus far and the plan going forward. D dimer elevated, more elevated than last admision. Dopplers neg for DVT.  The pt  states that her cardiologist wants her to repeat her stress test in the near future.  The pt states that she has never had benzos in the past, accepts them now. ? Anxiety. The pt states that her sister died 4 hrs after having similar symptoms and being discharged home from the ED, and this scares her.  The pt states that she dose not regularly take pain medication. Will repeat VQ & do a 2 set r/o for ACS. Pt advised that if w/u is neg, she has had 2 w/u in < 1 mo for similar Sx & that these Sx may be 2/2 anxiety. Pt sitting in stretcher, watching tv, in no distress.    10:27 PM  Discussed case with Dr. Maisie Fus (EP), and provided instructions to follow-up on pending test results, re-evaluate the patient, and make the appropriate disposition accordingly.  She will assume care of the patient at this time.    Provider Notes: pt woke up from sleep w/ palpitations, constant substernal chest pressure, & then SOB. Feels like she can't take a deep breath. V/D x 3, no blood. No sick contacts. Lightheaded when she stands w/ syncope. nonfocal neuro here. Recent admission for CP r/o ACS 4/17 neg for cardiac w/u. Did not do dopplers here. Will repeat r/o ACS, may need another D dimer if dopplers neg to r/o PE as FHx    HEART SCORE  Used for ACS Risk Stratification (Low Risk = total score of 0-3)  History:         Slightly suspicous  ECG:  Non specific repolarization disturbance                        +1  Age:              <=45  Risk:             1-2 risk factors                                                              +1  Troponin:      <= normal limit  Score: 2    This patient with chest pain does not have a clinical presentation highly concerning for acute myocardial infarction or unstable angina, the EKG does not show a new ischemic abnormality, the troponin is negative, there are no alternative diagnoses necessitating hospital admission, there are no identified barriers to outpatient followup, the HEART score is  0-3, and the troponin was drawn 0-3 hours after onset of chest pain.  Therefore, per the EMA Chest Pain Pathway, the EKG and troponin will be repeated 3 hours after the initial troponin.  If the EKG is unchanged and the troponin is negative, this patient is at very low risk for ACS within 30 days and is safe for discharge.    The patient is considered to be INTERMEDIATE RISK for pulmonary embolism by either  ___ Clinical gestalt  ___ Anner Crete rule  ___ Geneva Score  The patient does not require additional testing if the patient's age-adjusted d-dimer is negative.     Critical Care Time:      Diagnosis     Clinical Impression:   1. Pleuritic chest pain    2. Microcytic anemia    3. Heart palpitations    4. Chest pain in adult    5. Anxiety        Treatment Plan:   ED Disposition     Discharge [name] Cook discharge to home/self care.    Condition at disposition: Stable              _______________________________      Attestations: This note is prepared by Manus Gunning, acting as scribe for Carles Collet, MD.    Carles Collet, MD - The scribe's documentation has been prepared under my direction and personally reviewed by me in its entirety.  I confirm that the note above accurately reflects all work, treatment, procedures, and medical decision making performed by me.    _______________________________    Annett Fabian, MD  07/26/15 2122

## 2015-07-23 NOTE — Discharge Instructions (Signed)
Anemia    You have been diagnosed with anemia.    Anemia means "a low red blood cell count." Red blood cells are a part of your blood. These carry oxygen. Blood also has white blood cells, which fight infection and platelets, which help blood to clot.    Symptoms of anemia include fatigue (feeling tired) and weakness. Symptoms also include shortness of breath or chest pain with exercise or even normal activity. Another sign is pale color of the skin, lips and fingernail beds.    Anemia can have many causes. These include:   Ongoing (continual) blood loss. Sometimes there can be a slow "leak" of blood into the bowels. It can also happen with menstruation (menstrual period). Over time, the blood loss adds up. Then your blood count can get too low.   Iron deficiency: Iron is needed to make new red blood cells. Sometimes iron intake is too low for the body s needs.   Vitamin deficiency: The body needs vitamin B12 and folic acid to make new red blood cells. If intake of these vitamins is too low from poor nutrition or too much alcohol, you can become anemic.   Chronic diseases: Some medical illnesses cause low blood count. This is especially the case for those with generalized inflammation.   Kidney disease: Patients with long-term kidney problems can get anemia.   Blood breakdown: Some diseases cause the blood cells in the blood stream to be destroyed or broken down. This can cause a low blood count.    The exact cause of your anemia is not known at this time. You may have had tests to see why you are anemic. You can get the results soon. See your primary care doctor or the referral doctor for more evaluation.    After an evaluation, the doctor thinks your blood count IS NOT so low that you need a blood transfusion. Follow-up with your regular doctor for more rechecks on your blood count.    YOU SHOULD SEEK MEDICAL ATTENTION IMMEDIATELY, EITHER HERE OR AT THE NEAREST EMERGENCY DEPARTMENT, IF ANY OF THE  FOLLOWING OCCURS:   You get light-headed and dizzy as if about to faint.   You get worsening shortness of breath during regular activities like walking or climbing stairs.   You get chest pain during regular activities like walking or climbing stairs.   IF YOU WERE BLEEDING.Marland KitchenMarland KitchenIf bleeding gets worse.              Palpitations    You have been diagnosed with "palpitations."    Palpitations are beats in the chest that feel funny or strange. They are often caused by extra heartbeats that come earlier than normal. These are either "premature atrial contractions" or "premature ventricular contractions," depending on where in the heart they happen. Palpitations often go away on their own and often cause no serious problems. They are sometimes related to stress or lack of sleep. They can also be related too much caffeine. These symptoms can be caused by many over-the-counter (no prescription needed) cold medicines, diet pills and "natural" vitamin supplements with stimulants, often ephedrine (Ephedrine is also known by its traditional [place] name, Ma huang).    Palpitations feel different for different people. Some patients describe a feeling of "butterflies" in the chest. Others say it feels like the heart is "flipping over" in the chest. Palpitations may happen often but should last only a second or two each time. They should not cause any chest pain, lightheadedness, dizziness  or fainting.    There is no specific treatment for palpitations. However, you should avoid all caffeine and cold medications. Also avoid all natural stimulants and chocolate.    Follow up with your primary doctor in the next week to make sure your symptoms are getting better. In some cases, a Holter monitor may be ordered. A Holter monitor is a portable heart monitor. It records your heart s electrical rhythm. You will need to see your regular doctor to get the results from the test.    YOU SHOULD SEEK MEDICAL ATTENTION IMMEDIATELY,  EITHER HERE OR AT THE NEAREST EMERGENCY DEPARTMENT, IF ANY OF THE FOLLOWING OCCURS:   Lightheadedness or the feeling you might faint.   An unusually fast or slow heart rate.   Chest pain or shortness of breath.   Palpitations increase when you exercise.   Any other worsening symptoms or concerns.              Chest Pain of Unclear Etiology    You have been seen for chest pain. The cause of your pain is not yet known.    Your doctor has learned about your medical history, examined you, and checked any tests that were done. Still, it is unclear why you are having pain. The doctor thinks there is only a very small chance that your pain is caused by a life-threatening condition. Later, your primary care doctor might do more tests or check you again.    Sometimes chest pain is caused by a dangerous condition, like a heart attack, aorta injury, blood clot in the lung, or collapsed lung. It is unlikely that your pain is caused by a life-threatening condition if: Your chest pain lasts only a few seconds at a time; you are not short of breath, nauseated (sick to your stomach), sweaty, or lightheaded; your pain gets worse when you twist or bend; your pain improves with exercise or hard work.    Chest pain is serious. It is VERY IMPORTANT that you follow up with your regular doctor and seek medical attention immediately here or at the nearest Emergency Department if your symptoms become worse or they change.    YOU SHOULD SEEK MEDICAL ATTENTION IMMEDIATELY, EITHER HERE OR AT THE NEAREST EMERGENCY DEPARTMENT, IF ANY OF THE FOLLOWING OCCURS:   Your pain gets worse.   Your pain makes you short of breath, nauseated, or sweaty.   Your pain gets worse when you walk, go up stairs, or exert yourself.   You feel weak, lightheaded, or faint.   It hurts to breathe.   Your leg swells.   Your symptoms get worse or you have new symptoms or concerns.    Hypertension    You have been diagnosed with elevated blood  pressure.    The medical term for high blood pressure is hypertension. Many people feel anxious or uncomfortable about being at the hospital. If you feel anxious today, this could make your blood pressure appear high, even if your blood pressure is usually normal. Check your blood pressure several more times when you are not feeling stress. Keep a record of these readings and give this information to your regular doctor. He or she will decide whether you have hypertension that requires medical treatment.    If your blood pressure becomes extremely high all of a sudden, you will probably notice symptoms. In fact, very high blood pressure is a medical emergency. Most people with hypertension have blood pressure that is only a little  too high. Mild high blood pressure does not cause specific symptoms. Instead, the effects of hypertension develop slowly over time. Untreated hypertension can affect the heart, brain, kidneys, eyes, and blood vessels. Unfortunately, by the time side-effects become noticeable, the body has already been damaged. This is why hypertension is called "the silent killer!"    It is important to follow up with your regular doctor. Check your blood pressure several times in the next 1 to 2 weeks and tell your doctor about the results. It may be helpful to keep a log or a journal where you can write down your blood pressures. Note the time of day and the activity you were doing when the reading was taken.    YOU SHOULD SEEK MEDICAL ATTENTION IMMEDIATELY, EITHER HERE OR AT THE NEAREST EMERGENCY DEPARTMENT, IF ANY OF THE FOLLOWING OCCURS:   You have a headache.   You have chest pain.   You are short of breath or have trouble breathing.    You feel weak, especially on only one side of the body.   Your symptoms get worse or you have other concerns.

## 2015-07-24 NOTE — ED Provider Notes (Signed)
Emergency Department Sign Out Note      Date: 07/23/2015  Patient Name: [name] Cook      SIGN-OUT RECEIVED 1:05 AM   I assumed care of this patient from Dr. Conley Rolls.    Bedside rounds were done.    I evaluated the patient. On exam pt resting in no acute distress.  _____    Reassessment Time: 1:05 AM     Results were reviewed.     I informed the patient of findings and treatment plan was discussed.    Reassessment: Discussed results, further treatment plan including f/u plan with pcp and return precautions. Pt understands and agrees.    Chart reconciliation: Dr. Conley Rolls was the primary emergency physician of record.        Roxine Caddy, MD  08/27/15 [phone]

## 2015-07-25 LAB — ECG 12-LEAD
Atrial Rate: 70 {beats}/min
P Axis: 35 degrees
P-R Interval: 150 ms
Q-T Interval: 446 ms
QRS Duration: 84 ms
QTC Calculation (Bezet): 481 ms
R Axis: 20 degrees
T Axis: 13 degrees
Ventricular Rate: 70 {beats}/min

## 2015-07-26 ENCOUNTER — Encounter: Payer: Self-pay | Admitting: Emergency Medicine

## 2015-08-18 ENCOUNTER — Emergency Department: Payer: Medicaid Other

## 2015-08-18 ENCOUNTER — Observation Stay: Payer: Medicaid Other | Admitting: Student in an Organized Health Care Education/Training Program

## 2015-08-18 ENCOUNTER — Observation Stay
Admission: EM | Admit: 2015-08-18 | Discharge: 2015-08-20 | Disposition: A | Discharge status: Home / Self Care | Payer: Medicaid Other | Attending: Internal Medicine | Admitting: Internal Medicine

## 2015-08-18 DIAGNOSIS — R29810 Facial weakness: Principal | ICD-10-CM | POA: Insufficient documentation

## 2015-08-18 DIAGNOSIS — I10 Essential (primary) hypertension: Secondary | ICD-10-CM | POA: Insufficient documentation

## 2015-08-18 DIAGNOSIS — Z79899 Other long term (current) drug therapy: Secondary | ICD-10-CM | POA: Insufficient documentation

## 2015-08-18 DIAGNOSIS — R079 Chest pain, unspecified: Secondary | ICD-10-CM | POA: Insufficient documentation

## 2015-08-18 DIAGNOSIS — R569 Unspecified convulsions: Secondary | ICD-10-CM | POA: Diagnosis present

## 2015-08-18 DIAGNOSIS — G40909 Epilepsy, unspecified, not intractable, without status epilepticus: Secondary | ICD-10-CM | POA: Insufficient documentation

## 2015-08-18 DIAGNOSIS — R2990 Unspecified symptoms and signs involving the nervous system: Secondary | ICD-10-CM | POA: Diagnosis present

## 2015-08-18 HISTORY — DX: Unspecified convulsions: R56.9

## 2015-08-18 LAB — APTT: PTT: 29 s (ref 23–37)

## 2015-08-18 LAB — CBC AND DIFFERENTIAL
Basophils Absolute Automated: 0.03 10*3/uL (ref 0.00–0.20)
Basophils Automated: 0.3 %
Eosinophils Absolute Automated: 0.14 10*3/uL (ref 0.00–0.70)
Eosinophils Automated: 1.3 %
Hematocrit: 31 % — ABNORMAL LOW (ref 37.0–47.0)
Hgb: 9.2 g/dL — ABNORMAL LOW (ref 12.0–16.0)
Immature Granulocytes Absolute: 0.02 10*3/uL
Immature Granulocytes: 0.2 %
Lymphocytes Absolute Automated: 2.99 10*3/uL (ref 0.50–4.40)
Lymphocytes Automated: 27.5 %
MCH: 23 pg — ABNORMAL LOW (ref 28.0–32.0)
MCHC: 29.7 g/dL — ABNORMAL LOW (ref 32.0–36.0)
MCV: 77.5 fL — ABNORMAL LOW (ref 80.0–100.0)
MPV: 11.6 fL (ref 9.4–12.3)
Monocytes Absolute Automated: 0.69 10*3/uL (ref 0.00–1.20)
Monocytes: 6.4 %
Neutrophils Absolute: 7.01 10*3/uL (ref 1.80–8.10)
Neutrophils: 64.5 %
Nucleated RBC: 0 /100 WBC (ref 0.0–1.0)
Platelets: 218 10*3/uL (ref 140–400)
RBC: 4 10*6/uL — ABNORMAL LOW (ref 4.20–5.40)
RDW: 16 % — ABNORMAL HIGH (ref 12–15)
WBC: 10.86 10*3/uL — ABNORMAL HIGH (ref 3.50–10.80)

## 2015-08-18 LAB — COMPREHENSIVE METABOLIC PANEL
ALT: 13 U/L (ref 0–55)
AST (SGOT): 14 U/L (ref 5–34)
Albumin/Globulin Ratio: 0.8 — ABNORMAL LOW (ref 0.9–2.2)
Albumin: 3.6 g/dL (ref 3.5–5.0)
Alkaline Phosphatase: 76 U/L (ref 37–106)
Anion Gap: 5 (ref 5.0–15.0)
BUN: 12 mg/dL (ref 7.0–19.0)
Bilirubin, Total: 0.5 mg/dL (ref 0.2–1.2)
CO2: 27 mEq/L (ref 22–29)
Calcium: 9.1 mg/dL (ref 8.5–10.5)
Chloride: 109 mEq/L (ref 100–111)
Creatinine: 1 mg/dL (ref 0.6–1.0)
Globulin: 4.5 g/dL — ABNORMAL HIGH (ref 2.0–3.6)
Glucose: 65 mg/dL — ABNORMAL LOW (ref 70–100)
Potassium: 3.6 mEq/L (ref 3.5–5.1)
Protein, Total: 8.1 g/dL (ref 6.0–8.3)
Sodium: 141 mEq/L (ref 136–145)

## 2015-08-18 LAB — PT/INR
PT INR: 0.9 (ref 0.9–1.1)
PT: 12.7 s (ref 12.6–15.0)

## 2015-08-18 LAB — GLUCOSE WHOLE BLOOD - POCT: Whole Blood Glucose POCT: 69 mg/dL — ABNORMAL LOW (ref 70–100)

## 2015-08-18 LAB — GFR: EGFR: >60

## 2015-08-18 LAB — HEMOLYSIS INDEX: Hemolysis Index: 4 (ref 0–18)

## 2015-08-18 MED ORDER — GADOBUTROL 1 MMOL/ML IV SOLN
10.0000 mL | Freq: Once | INTRAVENOUS | Status: AC | PRN
Start: 2015-08-18 — End: 2015-08-18
  Administered 2015-08-18: 10 mmol via INTRAVENOUS

## 2015-08-18 MED ORDER — LORAZEPAM 2 MG/ML IJ SOLN
1.0000 mg | Freq: Once | INTRAMUSCULAR | Status: AC
Start: 2015-08-18 — End: 2015-08-18
  Administered 2015-08-18: 1 mg via INTRAVENOUS
  Filled 2015-08-18: qty 1

## 2015-08-18 MED ORDER — DEXTROSE 10 % IV BOLUS
250.0000 mL | Freq: Once | INTRAVENOUS | Status: AC
Start: 2015-08-18 — End: 2015-08-19
  Administered 2015-08-19: 250 mL via INTRAVENOUS
  Filled 2015-08-18: qty 250

## 2015-08-18 MED ORDER — MIDAZOLAM HCL 5 MG/5ML IJ SOLN
1.0000 mg | Freq: Once | INTRAMUSCULAR | Status: AC
Start: 2015-08-18 — End: 2015-08-18
  Administered 2015-08-18: 1 mg via INTRAVENOUS

## 2015-08-18 MED ORDER — MIDAZOLAM HCL 5 MG/5ML IJ SOLN
1.0000 mg | Freq: Once | INTRAMUSCULAR | Status: AC
Start: 2015-08-18 — End: 2015-08-18
  Administered 2015-08-18: 1 mg via INTRAVENOUS
  Filled 2015-08-18: qty 5

## 2015-08-18 MED ORDER — DEXTROSE 250 MG/ML IV SOLN
25.0000 g | Freq: Once | INTRAVENOUS | Status: DC
Start: 2015-08-18 — End: 2015-08-18

## 2015-08-18 NOTE — ED Provider Notes (Signed)
EMERGENCY DEPARTMENT HISTORY AND PHYSICAL EXAM     Physician/Midlevel provider first contact with patient: 08/18/15 1941         Date: 08/18/2015  Patient Name: [name] Cook    History of Presenting Illness     Chief Complaint   Patient presents with   . Facial Droop   . Chest Pain       History Provided By: Pt    Chief Complaint: Facial droop   Onset:Unknown   Timing: Sudden  Location: R sided   Severity: Moderate  Modifying Factors: None  Associated Symptoms: CP. No numbness, tingling, weakness, speech difficulty or other sx or complaints.     Additional History: [name] Cook is a 40 y.o. female c/o R sided facial droop. Pt states she was in the car 30 minutes ago, driving, when she began having midsternal CP and called her mom and was video chatting her, when her mom told her that her "face looked strange." Pt noted her droop 10 minutes ago but is unsure of when it started. She has a hx of seizures and HTN.Denies ETOH, tobacco or other drug use.   Onset of sx at 7pm.     PCP: Pcp, Noneorunknown, MD      Current Facility-Administered Medications   Medication Dose Route Frequency Provider Last Rate Last Dose   . aspirin chewable tablet 324 mg  324 mg Oral Once Thea Gist, MD       . dextrose (D10W) 10% bolus 250 mL  250 mL Intravenous Once Thea Gist, MD         Current Outpatient Prescriptions   Medication Sig Dispense Refill   . LISINOPRIL PO Take by mouth.     . LevETIRAcetam (KEPPRA PO) Take 500 mg by mouth 2 (two) times daily.        Marland Kitchen lidocaine (LIDODERM) 5 % Place 1 patch onto the skin daily. Remove & Discard patch within 12 hours or as directed by MD 6 each 0       Past History     Past Medical History:  Past Medical History   Diagnosis Date   . Hypertension    . Seizures        Past Surgical History:  History reviewed. No pertinent past surgical history.    Family History:  Family History   Problem Relation Age of Onset   . Myocardial Infarction Father 67   . Deep vein thrombosis Father         Social History:  Social History   Substance Use Topics   . Smoking status: Never Smoker    . Smokeless tobacco: None   . Alcohol Use: No       Allergies:  Allergies   Allergen Reactions   . Iodine    . Percocet [Oxycodone-Acetaminophen]    . Shellfish-Derived Products    . Toradol [Ketorolac Tromethamine]    . Tramadol        Review of Systems     Review of Systems   Constitutional: Negative for fever and chills.   HENT: Negative for congestion and rhinorrhea.    Eyes: Negative for discharge and redness.   Respiratory: Negative for chest tightness, shortness of breath and wheezing.    Cardiovascular: Positive for chest pain. Negative for palpitations.   Gastrointestinal: Negative for nausea, vomiting and abdominal pain.   Genitourinary: Negative for dysuria and hematuria.   Musculoskeletal: Negative for back pain, neck pain and neck stiffness.   Skin: Negative  for rash and wound.   Neurological: Positive for facial asymmetry. Negative for dizziness, weakness, numbness and headaches.   Psychiatric/Behavioral: Negative for confusion.            Physical Exam   BP 138/77 mmHg  Pulse 76  Temp(Src) 98.3 F (36.8 C) (Oral)  Resp 16  Wt 133.993 kg  SpO2 98%  LMP 07/28/2015    Physical Exam   Constitutional: She is oriented to person, place, and time. She appears well-developed and well-nourished. No distress.   HENT:   Head: Normocephalic and atraumatic.   Eyes: Conjunctivae and EOM are normal. Pupils are equal, round, and reactive to light. No scleral icterus.   Neck: Normal range of motion. No JVD present.   Cardiovascular: Normal rate, regular rhythm, normal heart sounds and intact distal pulses.  Exam reveals no gallop and no friction rub.    No murmur heard.  Pulmonary/Chest: Effort normal and breath sounds normal. No respiratory distress. She has no wheezes.   Abdominal: Soft. Bowel sounds are normal. She exhibits no distension and no mass. There is no tenderness. There is no rebound and no guarding.    Musculoskeletal: Normal range of motion. She exhibits no edema or tenderness.   Neurological: She is alert and oriented to person, place, and time. No sensory deficit.   R facial droop. Tongue protruding to the right one extrusion. Right upper and lower extremity weakness, 4/5     Skin: Skin is warm and dry. No rash noted. She is not diaphoretic.   Psychiatric: She has a normal mood and affect. Her behavior is normal.           Diagnostic Study Results     Labs -     Results     Procedure Component Value Units Date/Time    Comprehensive metabolic panel [[id number]]  (Abnormal) Collected:  08/18/15 1950    Specimen Information:  Blood Updated:  08/18/15 2023     Glucose 65 (L) mg/dL      BUN 40.9 mg/dL      Creatinine 1.0 mg/dL      Sodium 811 mEq/L      Potassium 3.6 mEq/L      Chloride 109 mEq/L      CO2 27 mEq/L      Calcium 9.1 mg/dL      Protein, Total 8.1 g/dL      Albumin 3.6 g/dL      AST (SGOT) 14 U/L      ALT 13 U/L      Alkaline Phosphatase 76 U/L      Bilirubin, Total 0.5 mg/dL      Globulin 4.5 (H) g/dL      Albumin/Globulin Ratio 0.8 (L)      Anion Gap 5.0     Hemolysis index [[id number]] Collected:  08/18/15 1950     Hemolysis Index 4 Updated:  08/18/15 2023    GFR [[id number]] Collected:  08/18/15 1950     EGFR >60.0 Updated:  08/18/15 2023    APTT [[id number]] Collected:  08/18/15 1950     PTT 29 sec Updated:  08/18/15 2022    Protime-INR [[id number]] Collected:  08/18/15 1950    Specimen Information:  Blood Updated:  08/18/15 2022     PT 12.7 sec      PT INR 0.9      PT Anticoag. Given Within 48 hrs. None     CBC with differential [[id number]]  (Abnormal) Collected:  08/18/15 1950  Specimen Information:  Blood from Blood Updated:  08/18/15 2014     WBC 10.86 (H) x10 3/uL      Hgb 9.2 (L) g/dL      Hematocrit 81.1 (L) %      Platelets 218 x10 3/uL      RBC 4.00 (L) x10 6/uL      MCV 77.5 (L) fL      MCH 23.0 (L) pg      MCHC 29.7 (L) g/dL      RDW 16 (H) %      MPV 11.6 fL      Neutrophils 64.5 %       Lymphocytes Automated 27.5 %      Monocytes 6.4 %      Eosinophils Automated 1.3 %      Basophils Automated 0.3 %      Immature Granulocyte 0.2 %      Nucleated RBC 0.0 /100 WBC      Neutrophils Absolute 7.01 x10 3/uL      Abs Lymph Automated 2.99 x10 3/uL      Abs Mono Automated 0.69 x10 3/uL      Abs Eos Automated 0.14 x10 3/uL      Absolute Baso Automated 0.03 x10 3/uL      Absolute Immature Granulocyte 0.02 x10 3/uL     Glucose Whole Blood - POCT [[id number]]  (Abnormal) Collected:  08/18/15 1950     POCT - Glucose Whole blood 69 (L) mg/dL Updated:  21/30/86 5784          Radiologic Studies -   Radiology Results (24 Hour)     Procedure Component Value Units Date/Time    MRI Angio Head without Contrast [[id number]]     Order Status:  Sent Updated:  08/18/15 2325    MRI Brain With / Without Contrast [[id number]]     Order Status:  Sent Updated:  08/18/15 2324    CT Head without Contrast [[id number]] Collected:  08/18/15 1951    Order Status:  Completed Updated:  08/18/15 1956    Narrative:        INDICATION: Right facial droop    COMPARISON: No prior    TECHNIQUE: Computed tomography of the head was performed at 5 mm slice  thickness from the skull base to the apex. A combination of automatic  exposure control and adjustment of the MA and/or KV was utilized  according to patient size.    FINDINGS:  The ventricular system is normal in size, shape and contour.   There is no acute edema, midline shift, hemorrhage, or extra-axial fluid  collection.  The gray white matter junctions are preserved.  The visualized portions of the paranasal sinuses are within normal  limits.  The visualized osseous structures are unremarkable.      Impression:       No acute intracranial abnormality    Kinnie Feil, MD   08/18/2015 7:52 PM        .      Medical Decision Making   I am the first provider for this patient.    I reviewed the vital signs, available nursing notes, past medical history, past surgical history, family history and  social history.    Vital Signs-Reviewed the patient's vital signs.     Patient Vitals for the past 12 hrs:   BP Temp Pulse Resp   08/18/15 2349 138/77 mmHg 98.3 F (36.8 C) 76 16   08/18/15 2254 - - 82 18  08/18/15 2235 - - 76 17   08/18/15 2223 - - 86 17   08/18/15 2200 - - 78 17   08/18/15 2145 - - 78 17   08/18/15 2130 - - 80 17   08/18/15 2125 - - 82 17   08/18/15 2115 - - 87 17   08/18/15 2100 (!) 189/101 mmHg - 76 17   08/18/15 2054 (!) 189/101 mmHg - 75 (!) 24   08/18/15 1955 (!) 169/95 mmHg 97.5 F (36.4 C) 73 21       Pulse Oximetry Analysis -  Normal 100% on RA    Cardiac Monitor:  Rate: 74  Rhythm:  Normal Sinus Rhythm     EKG:  Interpreted by the EP.   Time Interpreted: 1931   Rate: 72   Rhythm: Normal Sinus Rhythm    Interpretation: Nonspecific T wave flattening V3-V6. Normal PR, QRS and QT intervals.  No ST segment changes or significant T wave inversions.   Comparison: t wave abnormality new c/w last old EKG    Old Medical Records: Previous electrocardiograms. (See EKG interpretation above)     ED Course:   7:55 PM - Pt has already had a CT head. Will check stat MRI/MRA as pt reports hx of anaphylactic reaction to iodine.   8:03 PM - Discussed with Dr Roque Cash, neurology on call, who agrees with plan.   9:34 PM - Pt's MRI delayed because despite ordering her MRI stat, there is still another MRI in process. Pt will be taken to MRI.   9:53 PM - Pt in MRI with RN; states she does not want the MRI because she is claustrophobic even after 2 doses of Versed. Will also give 1 dose Ativan.   10:06 PM - Pt refuses tPA, but still agreeable with plan for MRI.   10:06 PM - Dr Roque Cash, neurology, made aware of update.   11:57 PM - Prelim report of MRI/MRA do not show anything acute. Will admit.   12:00 AM - Discussed patient case with Dr. Merlene Laughter, [facility], who agrees with the plan and accepts for hospitalization.     Core Measures:   Acute Ischemic CVA:  A head CT was ordered upon arrival to ED, a  result was available within     45 minutes of arrival.    This patient was last known in usual state of health at:    Date: 08/18/2015    Time: 7pm    The eligibility of tPA was assessed and discussed with a neurologist. tPA was not given because pt declined      Critical Care Time:   CRITICAL CARE: The high probability of sudden, clinically significant deterioration in the patient's condition required the highest level of my preparedness to intervene urgently.    The services I provided to this patient were to treat and/or prevent clinically significant deterioration that could result in: death or permanent disability.  Services included the following: chart data review, reviewing nursing notes and/or old charts, documentation time, consultant collaboration regarding findings and treatment options, medication orders and management, direct patient care, re-evaluations, vital sign assessments and ordering, interpreting and reviewing diagnostic studies/lab tests.    Aggregate critical care time was 75 minutes, which includes only time during which I was engaged in work directly related to the patient's care, as described above, whether at the bedside or elsewhere in the Emergency Department.  It did not include time spent performing other reported procedures or the services of residents,  students, nurses or Producer, television/film/video.      Provider Notes:   40 y/o woman with symptoms concerning for an acute cva, however with chest pain - given age ? Dissection    Anaphylatic iv contrast dye allergy - d/w radiology and mri tech, will need stat mri/mra    Delayed as another pt currently getting mri    Pt initially refusing MRI as she has claustrophibia, was given versed and ativan until able to obtain with significant delays.    Extensive discussion regarding risks benefits of tpa, esp in light of chest pain and possible dissection - pt is declining at this time even if mri shows acute cva.    MRI  wnl.    Fluctuating/improving deficits in the ed. Will admit for further treatment and evaluation.        Diagnosis     Clinical Impression:   1. Stroke-like symptoms        Disposition:   ED Disposition     Observation Admitting Physician: Derek Mound [[id number]]  Diagnosis: Stroke-like symptoms [711733]  Estimated Length of Stay: < 2 midnights  Tentative Discharge Plan?: Home or Self Care [1]  Patient Class: Observation [104]            _______________________________    Attestations:  This note is prepared by Latrelle Dodrill, acting as Scribe for Geannie Risen, MD.     Geannie Risen, MD:  The scribe's documentation has been prepared under my direction and personally reviewed by me in its entirety.  I confirm that the note above accurately reflects all work, treatment, procedures, and medical decision making performed by me.  _______________________________      Thea Gist, MD  08/20/15 [phone]

## 2015-08-18 NOTE — Stroke Progress Note (Addendum)
Date: 08/18/2015 Time: 8:25 PM    Stroke team notified: 38    40 y.o.  female with right facial droop and weakness of on right side.     Last known well: 1900  Cardiovascular risk factors: Obesity,Hypertension  Prior stroke: none  Pre-stroke mRS: 0 = No symptoms at all  Patient on anticoagulation: No  Initial NIH Stroke Score: 6  Dysphagia screen was deferred as patient with right side facial droop.  CT without contrast was performed, result showed "No acute intracranial abnormality"  Pt is not a candidate for IV-tPA because patient ultimately refused TPA.  There was also a delay in intervention to r/o dissection and need for MRI/MRA d/t patients allergies to dye.            Stroke education started. Plan of care discussed with patient and agrees to it.  Finding and plan discussed with emergency room MD and RN. Will follow as appropriate.           Stroke Team RN Signature: Rosario Adie    Modified Rankin Scale:  0- No symptoms at all   1- No significant disability despite symptoms; able to carry out all usual duties and activities   2- Slight disability; unable to carry out all previous activities, but able to look after own affairs without assistance   3- Moderate disability; requiring some help, but able to walk without assistance   4- Moderately severe disability; unable to walk without assistance and unable to attend to own bodily needs without assistance    5- Severe disability; bedridden, incontinent and requiring constant nursing care and attention

## 2015-08-18 NOTE — ED Notes (Signed)
[name] Cook is a 40 y.o. female, c/o R sided facial droop that began about 5 min PTA and "stabbing" sternal CP, rated 8/10, that began PTA. Pt denies headache, SOB or extremity weakness/tingling at this time. Hx seizures, compliant with Keppra. Pt A/O x4 at this time.

## 2015-08-19 ENCOUNTER — Observation Stay: Payer: Medicaid Other

## 2015-08-19 DIAGNOSIS — R2990 Unspecified symptoms and signs involving the nervous system: Secondary | ICD-10-CM | POA: Diagnosis present

## 2015-08-19 DIAGNOSIS — M6281 Muscle weakness (generalized): Secondary | ICD-10-CM

## 2015-08-19 LAB — CBC AND DIFFERENTIAL
Basophils Absolute Automated: 0.02 10*3/uL (ref 0.00–0.20)
Basophils Automated: 0.2 %
Eosinophils Absolute Automated: 0.14 10*3/uL (ref 0.00–0.70)
Eosinophils Automated: 1.7 %
Hematocrit: 27.1 % — ABNORMAL LOW (ref 37.0–47.0)
Hgb: 8.2 g/dL — ABNORMAL LOW (ref 12.0–16.0)
Immature Granulocytes Absolute: 0 10*3/uL
Immature Granulocytes: 0 %
Lymphocytes Absolute Automated: 2.42 10*3/uL (ref 0.50–4.40)
Lymphocytes Automated: 30.2 %
MCH: 23.3 pg — ABNORMAL LOW (ref 28.0–32.0)
MCHC: 30.3 g/dL — ABNORMAL LOW (ref 32.0–36.0)
MCV: 77 fL — ABNORMAL LOW (ref 80.0–100.0)
MPV: 11.3 fL (ref 9.4–12.3)
Monocytes Absolute Automated: 0.54 10*3/uL (ref 0.00–1.20)
Monocytes: 6.7 %
Neutrophils Absolute: 4.9 10*3/uL (ref 1.80–8.10)
Neutrophils: 61.2 %
Nucleated RBC: 0 /100 WBC (ref 0.0–1.0)
Platelets: 179 10*3/uL (ref 140–400)
RBC: 3.52 10*6/uL — ABNORMAL LOW (ref 4.20–5.40)
RDW: 16 % — ABNORMAL HIGH (ref 12–15)
WBC: 8.02 10*3/uL (ref 3.50–10.80)

## 2015-08-19 LAB — ECG 12-LEAD
Atrial Rate: 72 {beats}/min
P Axis: 30 degrees
P-R Interval: 146 ms
Q-T Interval: 422 ms
QRS Duration: 88 ms
QTC Calculation (Bezet): 462 ms
R Axis: 4 degrees
T Axis: -4 degrees
Ventricular Rate: 72 {beats}/min

## 2015-08-19 LAB — COMPREHENSIVE METABOLIC PANEL
ALT: 11 U/L (ref 0–55)
AST (SGOT): 12 U/L (ref 5–34)
Albumin/Globulin Ratio: 0.9 (ref 0.9–2.2)
Albumin: 3.3 g/dL — ABNORMAL LOW (ref 3.5–5.0)
Alkaline Phosphatase: 71 U/L (ref 37–106)
Anion Gap: 6 (ref 5.0–15.0)
BUN: 13 mg/dL (ref 7.0–19.0)
Bilirubin, Total: 0.6 mg/dL (ref 0.2–1.2)
CO2: 24 mEq/L (ref 22–29)
Calcium: 8.3 mg/dL — ABNORMAL LOW (ref 8.5–10.5)
Chloride: 108 mEq/L (ref 100–111)
Creatinine: 0.9 mg/dL (ref 0.6–1.0)
Globulin: 3.6 g/dL (ref 2.0–3.6)
Glucose: 99 mg/dL (ref 70–100)
Potassium: 3.6 mEq/L (ref 3.5–5.1)
Protein, Total: 6.9 g/dL (ref 6.0–8.3)
Sodium: 138 mEq/L (ref 136–145)

## 2015-08-19 LAB — GLUCOSE WHOLE BLOOD - POCT
Whole Blood Glucose POCT: 97 mg/dL (ref 70–100)
Whole Blood Glucose POCT: 80 mg/dL (ref 70–100)

## 2015-08-19 LAB — MAGNESIUM: Magnesium: 2.1 mg/dL (ref 1.6–2.6)

## 2015-08-19 LAB — TROPONIN I
Troponin I: 0.07 ng/mL (ref 0.00–0.09)
Troponin I: <0.01 ng/mL (ref 0.00–0.09)

## 2015-08-19 LAB — GFR: EGFR: >60

## 2015-08-19 LAB — HEMOLYSIS INDEX: Hemolysis Index: 5 (ref 0–18)

## 2015-08-19 LAB — PHOSPHORUS: Phosphorus: 3.4 mg/dL (ref 2.3–4.7)

## 2015-08-19 MED ORDER — ASPIRIN 300 MG RE SUPP
300.0000 mg | Freq: Once | RECTAL | Status: AC
Start: 2015-08-19 — End: 2015-08-19
  Administered 2015-08-19: 300 mg via RECTAL
  Filled 2015-08-19: qty 1

## 2015-08-19 MED ORDER — ONDANSETRON HCL 4 MG/2ML IJ SOLN
4.0000 mg | Freq: Four times a day (QID) | INTRAMUSCULAR | Status: DC | PRN
Start: 2015-08-19 — End: 2015-08-20

## 2015-08-19 MED ORDER — SODIUM CHLORIDE 0.9 % IV SOLN
INTRAVENOUS | Status: DC
Start: 2015-08-19 — End: 2015-08-20

## 2015-08-19 MED ORDER — ONDANSETRON 4 MG PO TBDP
4.0000 mg | ORAL_TABLET | Freq: Four times a day (QID) | ORAL | Status: DC | PRN
Start: 2015-08-19 — End: 2015-08-20

## 2015-08-19 MED ORDER — ASPIRIN 81 MG PO CHEW
81.0000 mg | CHEWABLE_TABLET | Freq: Every day | ORAL | Status: DC
Start: 2015-08-19 — End: 2015-08-20
  Administered 2015-08-19: 81 mg via ORAL
  Filled 2015-08-19: qty 1

## 2015-08-19 MED ORDER — NALOXONE HCL 0.4 MG/ML IJ SOLN (WRAP)
0.2000 mg | INTRAMUSCULAR | Status: DC | PRN
Start: 2015-08-19 — End: 2015-08-20

## 2015-08-19 MED ORDER — MORPHINE SULFATE 2 MG/ML IJ/IV SOLN (WRAP)
2.0000 mg | Status: DC | PRN
Start: 2015-08-19 — End: 2015-08-20
  Administered 2015-08-19 – 2015-08-20 (×4): 2 mg via INTRAVENOUS
  Filled 2015-08-19 (×4): qty 1

## 2015-08-19 MED ORDER — ACETAMINOPHEN 650 MG RE SUPP
650.0000 mg | RECTAL | Status: DC | PRN
Start: 2015-08-19 — End: 2015-08-20

## 2015-08-19 MED ORDER — ACETAMINOPHEN 325 MG PO TABS
650.0000 mg | ORAL_TABLET | ORAL | Status: DC | PRN
Start: 2015-08-19 — End: 2015-08-20
  Administered 2015-08-19: 650 mg via ORAL
  Filled 2015-08-19: qty 2

## 2015-08-19 MED ORDER — ENOXAPARIN SODIUM 40 MG/0.4ML SC SOLN
40.0000 mg | Freq: Every day | SUBCUTANEOUS | Status: DC
Start: 2015-08-19 — End: 2015-08-20
  Administered 2015-08-19: 40 mg via SUBCUTANEOUS
  Filled 2015-08-19: qty 0.4

## 2015-08-19 MED ORDER — LEVETIRACETAM 500 MG PO TABS
500.0000 mg | ORAL_TABLET | Freq: Two times a day (BID) | ORAL | Status: DC
Start: 2015-08-19 — End: 2015-08-20
  Administered 2015-08-19 (×3): 500 mg via ORAL
  Filled 2015-08-19 (×3): qty 1

## 2015-08-19 MED ORDER — ASPIRIN 81 MG PO CHEW
324.0000 mg | CHEWABLE_TABLET | Freq: Once | ORAL | Status: DC
Start: 2015-08-19 — End: 2015-08-19
  Filled 2015-08-19: qty 4

## 2015-08-19 MED ORDER — LISINOPRIL 5 MG PO TABS
5.0000 mg | ORAL_TABLET | Freq: Every day | ORAL | Status: DC
Start: 2015-08-19 — End: 2015-08-20
  Administered 2015-08-19: 5 mg via ORAL
  Filled 2015-08-19: qty 1

## 2015-08-19 NOTE — Discharge Summary (Signed)
[facility]      Patient: [name] Cook  Admission Date: 08/18/2015   DOB: September 27, 1975  Discharge Date: 08/20/2015    MRN: [id number]  Discharge Attending:Roshad Hack J Asiyah Pineau     Referring Physician: Bobby Rumpf, MD  PCP: Bobby Rumpf, MD       DISCHARGE SUMMARY     Discharge Information   Admission Diagnosis:   Stroke-like symptoms    Discharge Diagnosis:   Active Hospital Problems    Diagnosis   . Seizures   . Stroke-like symptoms   . Chest pain        Admission Condition: stroke like symptoms  Discharge Condition: stroke like symptoms- negative for acute stroke  Consultants: neuro  Functional Status: good  Discharged to: home    Discharge Medications:     Medication List      START taking these medications          aspirin 81 MG chewable tablet   Chew 1 tablet (81 mg total) by mouth daily.         CONTINUE taking these medications          KEPPRA PO       lidocaine 5 %   Commonly known as:  LIDODERM   Place 1 patch onto the skin daily. Remove & Discard patch within 12 hours or as directed by MD       LISINOPRIL PO            Where to Get Your Medications      Information about where to get these medications is not yet available     ! Ask your nurse or doctor about these medications    - aspirin 81 MG chewable tablet              Hospital Course   Presentation History   40 y.o. female with a PMHx of hypertension, seizures who presented with right sided facial droop that occurred around 6pm yesterday. Pt states that she was driving and facetiming her mother at the same time. Pt states that her mother said that her face looked different. Pt noted a facial droop. She denies weakness in legs or arms. Reports cp that started while she was in the ED. Pt denies any increased stressors in life. Denies tobacco/alcohol or drug use.      See HPI for details.    Hospital Course ( Days)   Patients facial droop disapears while she talks. When asked to smile or show her teeth it will reappear. Able to move tongue to  the right and left. MRI/MRA imaging was negative for stroke. This may also be conversion disorder or an antiseizure medication adverse effect, but doubtful.   Patient was assessed by neurology-no further recs, does not feel this is stroke.  EKG in the ER was NSR, no troponin found, serial troponin x 2 - negative  Patient has a baby aspirin that she takes at home daily. She can continue the same on discharge.     Procedures/Imaging:   Upon my review: MRI negative for stroke       Best Practices   Was the patient admitted with either a CHF Exacerbation or Pneumonia? n     Progress Note/Physical Exam at Discharge     Subjective: feels well. Is not distressed by her intermittent facial droop.     Filed Vitals:    08/19/15 1614 08/19/15 1914 08/19/15 2323 08/20/15 0340   BP: 113/64 112/61 116/78 114/62   Pulse:  66 57 78 65   Temp: 97.4 F (36.3 C) 98.1 F (36.7 C) 98.1 F (36.7 C) 98.2 F (36.8 C)   TempSrc: Oral Oral Oral Oral   Resp: 18 18 18 18    Height:       Weight:    141.023 kg (310 lb 14.4 oz)   SpO2: 96% 100% 95% 97%         General: NAD, AAOx3, morbidly obses  HEENT: perrla, eomi, sclera anicteric, OP: Clear, MMM  Neck: supple, FROM, no LAD  Cardiovascular: RRR, no m/r/g  Lungs: CTAB, no w/r/r  Abdomen: soft, obese, +BS, NT/ND, no masses, no g/r  Extremities: no C/C/E  Skin: no rashes or lesions noted  Neuro: facial droop that disappears on speaking.     Diagnostics     Labs/Studies Pending at Discharge: No    Last Labs     Recent Labs  Lab 08/19/15  0918 08/18/15  1950   WBC 8.02 10.86*   RBC 3.52* 4.00*   HGB 8.2* 9.2*   HEMATOCRIT 27.1* 31.0*   MCV 77.0* 77.5*   PLATELETS 179 218         Recent Labs  Lab 08/19/15  0918 08/18/15  1950   SODIUM 138 141   POTASSIUM 3.6 3.6   CHLORIDE 108 109   CO2 24 27   BUN 13.0 12.0   CREATININE 0.9 1.0   GLUCOSE 99 65*   CALCIUM 8.3* 9.1   MAGNESIUM 2.1  --              Microbiology Results     None           Patient Instructions   Discharge Diet: regular  Discharge  Activity: as tolerated    Follow Up Appointment:  Follow-up Information     Follow up with Pcp, Noneorunknown, MD .           Time spent examining patient, discussing with patient/family regarding hospital course, chart review, reconciling medications and discharge planning: >30 minutes.    Theodis Sato    11:23 AM 08/20/2015

## 2015-08-19 NOTE — Consults (Signed)
[facility] Consultation Note                                       Date Time: 08/19/2015 11:26 AM  Patient Name: [name] Cook  Requesting Physician: Imogene Burn, MD  Date of Admission: 08/18/2015    CC / Reason for Consultation: Facial weakness        Assessment:   Chest pain with reported facial asymmetry in a 40 y.o. female with a past medical history significant for hypertension and reported seizure disorder.  No evidence of any acute intracranial abnormality.  Exam suggests a functional component.      Plan:   -No need for any further neurologic workup  -Continue home Keppra 500 mg BID  -PT/OT  -Okay to discharge from a neurologic standpoint.  Can follow-up with [place] in 3-4 weeks.    Neurology Attending Addendum:    I have personally reviewed the interval history, images and pertinent test results and I have personally examined the patient and confirmed the major physical findings of the preceding NP/PA note.  Also, I have noted any changes since the note was written as well as added additional findings and recommendations.    Pt admitted with complaints of right facial drooping.  On exam, she is able to smile on the right but unable to puff her cheeks on the right.  She can puff her cheeks on the left but does not activate for smile.  She then protrudes her tongue to the right but then is able to move midline and left. Throughout her exam, her facial droop fluctuates.  - MRI brain wnl  - can proceed with PT/OT/speech  - no further recommendations, okay to discharge from neurology standpoint.    Charlynn Court, MD  [facility]        HPI   [name] Cook is a 40 y.o. female with a past medical history significant for hypertension and reported seizure disorder who presented to the hospital yesterday with facial asymmetry.  Patient reports that she was driving her car yesterday when she developed chest pain.  She apparently facetimed her mother who noted that she had some  facial asymmetry and suggested she drive to the nearest hospital.  Patient states she saw hospital signs for [place] and proceeded here.  She denies any headache at the time. No reported nausea, vision changes, or unilateral weakness or paresthesias.  She continues to complain of mild to moderate intermittent chest discomfort described as "stabbing".  She does report that her face feels different.  On exam, she has a right sided weakness that is effort dependent with a giveaway component.  MRI of the brain did not reveal any acute intracranial abnormality.  MRA does not reveal any evidence of high-grade stenosis or large vessel occlusion.      Past Medical Hx     Past Medical History   Diagnosis Date   . Hypertension    . Seizures           Past Surgical Hx:   History reviewed. No pertinent past surgical history.     Family Medical History:      Family History   Problem Relation Age of Onset   . Myocardial Infarction Father 22   . Deep vein thrombosis Father        Social Hx     Social History  Social History   . Marital Status: Single     Spouse Name: N/A   . Number of Children: N/A   . Years of Education: N/A     Occupational History   . Not on file.     Social History Main Topics   . Smoking status: Never Smoker    . Smokeless tobacco: Not on file   . Alcohol Use: No   . Drug Use: No   . Sexual Activity: Not on file     Other Topics Concern   . Not on file     Social History Narrative       Meds     Home :   Prior to Admission medications    Medication Sig Start Date End Date Taking? Authorizing Provider   LevETIRAcetam (KEPPRA PO) Take 500 mg by mouth 2 (two) times daily.      Yes [provider]   LISINOPRIL PO Take 5 mg by mouth.      Yes [provider]   lidocaine (LIDODERM) 5 % Place 1 patch onto the skin daily. Remove & Discard patch within 12 hours or as directed by MD 06/19/15   Berenice Bouton, MD      Inpatient :   Current Facility-Administered Medications   Medication Dose Route  Frequency   . enoxaparin  40 mg Subcutaneous Daily   . levETIRAcetam  500 mg Oral Q12H SCH   . lisinopril  5 mg Oral Daily         Allergies    Iodine; Percocet; Shellfish-derived products; Toradol; and Tramadol      Review of Systems   All other systems were reviewed and are negative except for that mentioned in the HPI    Physical Exam:   Temp:  [96.7 F (35.9 C)-98.3 F (36.8 C)] 96.7 F (35.9 C)  Heart Rate:  [57-87] 68  Resp Rate:  [16-24] 18  BP: (123-189)/(66-101) 123/76 mmHg     Vital Signs:  Reviewed    General: The patient is well developed and well nourished.  No acute distress. Cooperative with the exam  Neck:  no carotid bruits  CVS: RRR, no murmurs, rubs,or gallops  Resp: CTA bilaterally, no retractions  Abd: Soft, nontender  Extremities: no pedal edema, extremities normal in color    Mental Status: The patient is awake, alert.  She is oriented to person, place, month and year.  Affect is normal  Fund of of knowledge is appropriate  Recent and remote memory are intact  Attention span and concentration are diminished  Language function is normal. There is no evidence of aphasia in conversational speech.    Cranial nerves:   -CN II: Visual fields appear full to bedside confrontation   -CN III, IV, VI: Pupils equal, round, and reactive to light; extraocular movements intact; no ptosis              -CN V: Facial sensation decreased to LT/temp/pp in right V1 through V3 distributions   -CN VII: Mild baseline asymmetry with normal activation  -CN VIII: Hearing intact to conversational speech   -CN IX, X: Palate elevates symmetrically; normal phonation   -CN XI: Symmetric full strength of sternocleidomastoid and trapezius muscles   -CN XII: Tongue protrudes midline  Fundus: Attempted, unable to visualize optic disks  Motor: Muscle tone normal without spasticity or flaccidity. No atrophy.  No pronator drift.  Strength  R / L    R / L  Deltoid  5 / 5  Hip Flexion 5 / 5  Triceps  5 / 5   Hip extension 5 /  5  Biceps  5 / 5   Knee flexion 5 / 5  Wrist ext 5 / 5  Knee ext 5 / 5  Wrist flexion 5 / 5  Dorsiflexion 5 / 5  FF   5 / 5  Plantar flexion 5 / 5    Sensory:   Light touch/temp/vibr decreased on right arm/leg.    Reflexes: Deep tendon reflexes are 2+ bilaterally, plantars are flexor    Coordination: FTN is intact, no truncal ataxia. No tremors    Gait: Deferred secondary to patient safety concerns      Labs:     Results     Procedure Component Value Units Date/Time    Comprehensive metabolic panel [[id number]]  (Abnormal) Collected:  08/19/15 0918    Specimen Information:  Blood Updated:  08/19/15 1002     Glucose 99 mg/dL      BUN 40.9 mg/dL      Creatinine 0.9 mg/dL      Sodium 811 mEq/L      Potassium 3.6 mEq/L      Chloride 108 mEq/L      CO2 24 mEq/L      Calcium 8.3 (L) mg/dL      Protein, Total 6.9 g/dL      Albumin 3.3 (L) g/dL      AST (SGOT) 12 U/L      ALT 11 U/L      Alkaline Phosphatase 71 U/L      Bilirubin, Total 0.6 mg/dL      Globulin 3.6 g/dL      Albumin/Globulin Ratio 0.9      Anion Gap 6.0     Magnesium [[id number]] Collected:  08/19/15 0918    Specimen Information:  Blood Updated:  08/19/15 1002     Magnesium 2.1 mg/dL     Phosphorus [[id number]] Collected:  08/19/15 0918    Specimen Information:  Blood Updated:  08/19/15 1002     Phosphorus 3.4 mg/dL     Hemolysis index [[id number]] Collected:  08/19/15 0918     Hemolysis Index 5 Updated:  08/19/15 1002    GFR [[id number]] Collected:  08/19/15 0918     EGFR >60.0 Updated:  08/19/15 1002    CBC with differential [[id number]]  (Abnormal) Collected:  08/19/15 0918    Specimen Information:  Blood from Blood Updated:  08/19/15 0932     WBC 8.02 x10 3/uL      Hgb 8.2 (L) g/dL      Hematocrit 03.4 (L) %      Platelets 179 x10 3/uL      RBC 3.52 (L) x10 6/uL      MCV 77.0 (L) fL      MCH 23.3 (L) pg      MCHC 30.3 (L) g/dL      RDW 16 (H) %      MPV 11.3 fL      Neutrophils 61.2 %      Lymphocytes Automated 30.2 %      Monocytes 6.7 %      Eosinophils  Automated 1.7 %      Basophils Automated 0.2 %      Immature Granulocyte 0.0 %      Nucleated RBC 0.0 /100 WBC      Neutrophils Absolute 4.90 x10 3/uL      Abs Lymph Automated 2.42 x10 3/uL  Abs Mono Automated 0.54 x10 3/uL      Abs Eos Automated 0.14 x10 3/uL      Absolute Baso Automated 0.02 x10 3/uL      Absolute Immature Granulocyte 0.00 x10 3/uL     Glucose Whole Blood - POCT [[id number]] Collected:  08/19/15 0624     POCT - Glucose Whole blood 97 mg/dL Updated:  40/98/11 9147    Comprehensive metabolic panel [[id number]]  (Abnormal) Collected:  08/18/15 1950    Specimen Information:  Blood Updated:  08/18/15 2023     Glucose 65 (L) mg/dL      BUN 86.5 mg/dL      Creatinine 1.0 mg/dL      Sodium 784 mEq/L      Potassium 3.6 mEq/L      Chloride 109 mEq/L      CO2 27 mEq/L      Calcium 9.1 mg/dL      Protein, Total 8.1 g/dL      Albumin 3.6 g/dL      AST (SGOT) 14 U/L      ALT 13 U/L      Alkaline Phosphatase 76 U/L      Bilirubin, Total 0.5 mg/dL      Globulin 4.5 (H) g/dL      Albumin/Globulin Ratio 0.8 (L)      Anion Gap 5.0     Hemolysis index [[id number]] Collected:  08/18/15 1950     Hemolysis Index 4 Updated:  08/18/15 2023    GFR [[id number]] Collected:  08/18/15 1950     EGFR >60.0 Updated:  08/18/15 2023    APTT [[id number]] Collected:  08/18/15 1950     PTT 29 sec Updated:  08/18/15 2022    Protime-INR [[id number]] Collected:  08/18/15 1950    Specimen Information:  Blood Updated:  08/18/15 2022     PT 12.7 sec      PT INR 0.9      PT Anticoag. Given Within 48 hrs. None     CBC with differential [[id number]]  (Abnormal) Collected:  08/18/15 1950    Specimen Information:  Blood from Blood Updated:  08/18/15 2014     WBC 10.86 (H) x10 3/uL      Hgb 9.2 (L) g/dL      Hematocrit 95.1 (L) %      Platelets 218 x10 3/uL      RBC 4.00 (L) x10 6/uL      MCV 77.5 (L) fL      MCH 23.0 (L) pg      MCHC 29.7 (L) g/dL      RDW 16 (H) %      MPV 11.6 fL      Neutrophils 64.5 %      Lymphocytes Automated 27.5 %       Monocytes 6.4 %      Eosinophils Automated 1.3 %      Basophils Automated 0.3 %      Immature Granulocyte 0.2 %      Nucleated RBC 0.0 /100 WBC      Neutrophils Absolute 7.01 x10 3/uL      Abs Lymph Automated 2.99 x10 3/uL      Abs Mono Automated 0.69 x10 3/uL      Abs Eos Automated 0.14 x10 3/uL      Absolute Baso Automated 0.03 x10 3/uL      Absolute Immature Granulocyte 0.02 x10 3/uL     Glucose Whole Blood - POCT [[id number]]  (Abnormal) Collected:  08/18/15 1950     POCT - Glucose Whole blood 69 (L) mg/dL Updated:  60/45/40 9811          Rads:     Results for orders placed or performed during the hospital encounter of 08/18/15   MRI Brain With / Without Contrast    Narrative    MRI BRAIN W WO CONTRAST  CLINICAL HISTORY:Right facial droop    COMPARISON: None available.    TECHNIQUE: Multiplanar multisequence imaging performed. Following 10 cc  Gadavist axial and coronal T1-weighted images were obtained.     INTERPRETATION: Small hyperintense foci noted in the left frontal  subcortical white matter and the centrum semiovale. No other focal  abnormality.  No area of restricted diffusion identified. No acute  hemorrhage, mass lesion or mass effect. The brainstem, cerebellum and  the craniocervical junction are within normal limits. The contrast  enhancement pattern is normal.      Impression         [No acute process Specifically there is no acute  cortical infarct, mass or evidence of hemorrhage.       Heron Nay, MD   08/19/2015 8:15 AM     MRI Angio Head without Contrast    Narrative    CLINICAL INDICATION: Right facial droop     COMPARISON: None available.     TECHNIQUE:  3-D time-of-flight methodology was utilized to assess the  vessels at the base of the brain and the major branches of the circle of  Willis. Multiplanar reconstructed images were created from the source  data.    INTERPRETATION:  Exam degraded by motion artifact. The distal internal  carotid arteries and the basilar artery are normal in  course and  caliber. The proximal portions of the anterior, middle and posterior  cerebral arteries are normal in course and caliber.  No aneurysms or  significant vascular anomalies are noted.      Impression      Technically degraded by motion. No evidence of  high-grade stenosis or large vessel occlusion.    Heron Nay, MD   08/19/2015 8:18 AM     CT Head without Contrast    Narrative    INDICATION: Right facial droop    COMPARISON: No prior    TECHNIQUE: Computed tomography of the head was performed at 5 mm slice  thickness from the skull base to the apex. A combination of automatic  exposure control and adjustment of the MA and/or KV was utilized  according to patient size.    FINDINGS:  The ventricular system is normal in size, shape and contour.   There is no acute edema, midline shift, hemorrhage, or extra-axial fluid  collection.  The gray white matter junctions are preserved.  The visualized portions of the paranasal sinuses are within normal  limits.  The visualized osseous structures are unremarkable.      Impression     No acute intracranial abnormality    Kinnie Feil, MD   08/18/2015 7:52 PM         Katheren Shams, FNP-C  Nurse Practitioner  [facility]  Daytime: [id number]  Consult requests: [phone]  After 5:00 pm: [phone]

## 2015-08-19 NOTE — Progress Notes (Signed)
Discussed discharge plan with Dr. Seward Speck, informed her that pt recently had dose of IV morphine and that pt will be driving herself home.  Per Dr. Seward Speck, pt may be discharged 2-4 hours after last dose of morphine (per unit routine).  Also, Dr. Seward Speck states pt does not require PT/OT/SLP eval prior to discharge as pt has been seen ambulating in room independently, without difficulty, and has tolerated food/drink without any signs of aspiration.

## 2015-08-19 NOTE — Progress Notes (Signed)
[facility]  PROGRESS NOTE      Patient: [name] Cook  Date: 08/19/2015   LOS:  Days  Admission Date: 08/18/2015   MRN: [id number]  Attending: Imogene Burn  Please contact me on the following Spectralink [phone]       ASSESSMENT/PLAN     [name] Cook is a 40 y.o. female admitted with Stroke-like symptoms    Interval Summary:     Patient Active Hospital Problem List:   Stroke-like symptoms (08/19/2015)    -<24 hours since onset, ?TIA, however- disappears and reappears depending on speaking or not. Continue asa. Had 300mg  in ER, continue 81mg  daily. Neuro assessed the patient. MRI/MRA negative. Discharge    Seizures- continue home meds    Chest pain- chest pain noted in ER, no troponin  EKG wnl in ER  Trop pending now x 2  Patient getting morphine for chest pain      Analgesia: morphine    Nutrition: regular. No speech eval needed as this does not appear to be true stroke    DVT Prophylaxis:enox       Code Status: full    DISPO: home in AM    Family Contact: none       SUBJECTIVE     [name] Cook states that she was driving and it suddenly happened. Denies stress in her life. Denies any weakness of arms or legs.     MEDICATIONS     Current Facility-Administered Medications   Medication Dose Route Frequency   . aspirin  81 mg Oral Daily   . enoxaparin  40 mg Subcutaneous Daily   . levETIRAcetam  500 mg Oral Q12H SCH   . lisinopril  5 mg Oral Daily       PHYSICAL EXAM     Filed Vitals:    08/19/15 1208   BP: 137/78   Pulse: 71   Temp: 97.4 F (36.3 C)   Resp: 18   SpO2: 94%       Temperature: Temp  Min: 96.7 F (35.9 C)  Max: 98.3 F (36.8 C)  Pulse: Pulse  Min: 57  Max: 87  Respiratory: Resp  Min: 16  Max: 24  Non-Invasive BP: BP  Min: 123/76  Max: 189/101  Pulse Oximetry SpO2  Min: 94 %  Max: 100 %    Intake and Output Summary (Last 24 hours) at Date Time  No intake or output data in the 24 hours ending 08/19/15 1506      GEN APPEARANCE: Normal;  A&OX3, right facial droop  HEENT: PERLA; EOMI; Conjunctiva  Clear  NECK: Supple; No bruits  CVS: RRR, S1, S2; No M/G/R  LUNGS: CTAB; No Wheezes; No Rhonchi: No rales  ABD: Soft; No TTP; + Normoactive BS  EXT: No edema; Pulses 2+ and intact  Skin exam:  pink  NEURO: right facial droop, disappears while speaking. No other deficits  CAP REFILL:  Normal  MENTAL STATUS:  Normal    Exam done by Imogene Burn, MD on 08/19/2015 at 3:06 PM      LABS       Recent Labs  Lab 08/19/15  0918 08/18/15  1950   WBC 8.02 10.86*   RBC 3.52* 4.00*   HGB 8.2* 9.2*   HEMATOCRIT 27.1* 31.0*   MCV 77.0* 77.5*   PLATELETS 179 218         Recent Labs  Lab 08/19/15  0918 08/18/15  1950   SODIUM 138 141   POTASSIUM 3.6  3.6   CHLORIDE 108 109   CO2 24 27   BUN 13.0 12.0   CREATININE 0.9 1.0   GLUCOSE 99 65*   CALCIUM 8.3* 9.1   MAGNESIUM 2.1  --          Recent Labs  Lab 08/19/15  0918 08/18/15  1950   ALT 11 13   AST (SGOT) 12 14   BILIRUBIN, TOTAL 0.6 0.5   ALBUMIN 3.3* 3.6   ALKALINE PHOSPHATASE 71 76               Recent Labs  Lab 08/18/15  1950   PT INR 0.9   PT 12.7   PTT 29       Microbiology Results     None           RADIOLOGY     Upon my review:    Gretel Acre Leanette Eutsler  3:06 PM 08/19/2015

## 2015-08-19 NOTE — UM Notes (Signed)
OBS per order on 08/19/15   MEDICAID HMO - MEDICAID [place] [facility]    Place (admit) for Observation Services (Order [id number])   Ordered On Ordered By       08/19/2015 12:01 AM Latrelle Dodrill      Order Questions    Question Answer Comment   Admitting Physician Doy Hutching D    Diagnosis Stroke-like symptoms    Estimated Length of Stay < 2 midnights    Tentative Discharge Plan? Home or Self Care    Patient Class      [name] Cook is a 40 y.o. female, c/o R sided facial droop that began about 5 min PTA and "stabbing" sternal CP, rated 8/10, that began PTA. Pt denies headache, SOB or extremity weakness/tingling at this time. Hx seizures, compliant with Keppra.   Patient Active Hospital Problem List:  Stroke-like symptoms (08/19/2015)  Chest pain  Right facial droop  Elevated BP  Seizures     - MRI normal per radiologist. I suspect pt in malingering vs conversion disorder. She c/o facial droop however intermittently her droop resolved. Pt refused tPA.  - no EKG changes  - placed on PTA meds  - neurology consulted, follow up recs in am     Nutrition  Cardiac diet     DVT/VTE Prophylaxis  Lovenox     Past Medical History  Diagnosis  Date  Hypertension  Seizures    Temp:  [96.7 F (35.9 C)-98.3 F (36.8 C)] 97.4 F (36.3 C)  Heart Rate:  [57-87] 71  Resp Rate:  [16-24] 18  BP: (123-189)/(66-101) 137/78 mmHg     Lab Results last 48 Hours     Procedure Component Value Units Date/Time    Comprehensive metabolic panel [[id number]]  (Abnormal) Collected:  08/19/15 0918    Specimen Information:  Blood Updated:  08/19/15 1002     Glucose 99 mg/dL      BUN 78.2 mg/dL      Creatinine 0.9 mg/dL      Sodium 956 mEq/L      Potassium 3.6 mEq/L      Chloride 108 mEq/L      CO2 24 mEq/L      Calcium 8.3 (L) mg/dL      Protein, Total 6.9 g/dL      Albumin 3.3 (L) g/dL     CBC with differential [[id number]]  (Abnormal) Collected:  08/19/15 0918    Specimen Information:  Blood from Blood Updated:  08/19/15 0932     WBC  8.02 x10 3/uL      Hgb 8.2 (L) g/dL      Hematocrit 46.9 (L) %      Platelets 179 x10 3/uL      RBC 3.52 (L) x10 6/uL      MCV 77.0 (L) fL      MCH 23.3 (L) pg      MCHC 30.3 (L) g/dL      RDW 16 (H) %     Glucose Whole Blood - POCT [[id number]] Collected:  08/19/15 0624     POCT - Glucose Whole blood 97 mg/dL Updated:  24/40/10 2725    Comprehensive metabolic panel [[id number]]  (Abnormal) Collected:  08/18/15 1950    Specimen Information:  Blood Updated:  08/18/15 2023     Glucose 65 (L) mg/dL      BUN 42.5 mg/dL      Creatinine 1.0 mg/dL      Sodium 956 mEq/L  Potassium 3.6 mEq/L      Chloride 109 mEq/L      CO2 27 mEq/L      Calcium 9.1 mg/dL      Globulin 4.5 (H) g/dL      Albumin/Globulin Ratio 0.8 (L)      Anion Gap 5.0     CBC with differential [[id number]]  (Abnormal) Collected:  08/18/15 1950    Specimen Information:  Blood from Blood Updated:  08/18/15 2014     WBC 10.86 (H) x10 3/uL      Hgb 9.2 (L) g/dL      Hematocrit 02.7 (L) %      Platelets 218 x10 3/uL      RBC 4.00 (L) x10 6/uL      MCV 77.5 (L) fL      MCH 23.0 (L) pg      MCHC 29.7 (L) g/dL      RDW 16 (H) %     Glucose Whole Blood - POCT [[id number]]  (Abnormal) Collected:  08/18/15 1950     POCT - Glucose Whole blood 69 (L) mg/dL Updated:  47/42/59 5638        CT head-No acute intracranial abnormality  MRI head w/o contrast-IMPRESSION: Technically degraded by motion. No evidence of  high-grade stenosis or large vessel occlusion.  MRI brain w/o contrast-IMPRESSION: No acute process Specifically there is no acute  cortical infarct, mass or evidence of hemorrhage  CXR-No acute cardiopulmonary disease.    IV NS @ 100cc/hr, I&O, tele, O2 sat monitoring per protocol, IV MS, prn one dose given today      Kathee Delton, RN BSN

## 2015-08-19 NOTE — Progress Notes (Signed)
admitted from ED, oriented to room and call bell. V's wnl. Explained the plan of care. patient verbalized understanding of the plan.20 G IV in RT ac INFUSING ns AT 100 cc/hr.

## 2015-08-19 NOTE — ED Notes (Signed)
Pt a/ox4, presented with facial droop and R sided weakness, still unresolved. Ambulatory pta, has not ambulated here. MRI results in process. #[id number].

## 2015-08-19 NOTE — Plan of Care (Addendum)
Problem: Neurological Deficit  Goal: Neurological status is stable or improving  Intervention: Monitor/assess NIH Stroke Scale.  Rt facial droop and rt extremities weakness noted. V's wnl. Iv fluid infusing. keppra given. Patient complained of chest pain same as admission, MD paged. Waiting for response.

## 2015-08-19 NOTE — Plan of Care (Signed)
Problem: Health Promotion  Goal: Vaccination Screening  All patients will be screened for current vaccination status on each admission.   Outcome: Completed Date Met:  08/19/15  Goal: Knowledge - disease process  Extent of understanding conveyed about a specific disease process.   Outcome: Progressing  Goal: Risk control - tobacco abuse  Actions to eliminate or reduce tobacco use.   Outcome: Completed Date Met:  08/19/15  Goal: Knowledge - health resources  Extent of understanding and conveyed about healthcare resources.   Outcome: Progressing    Problem: Safety  Goal: Patient will be free from injury during hospitalization  Outcome: Progressing  Reviewed fall prevention strategies with pt.  Pt has non-skid slippers on.    Intervention: Hourly rounding.  Hourly rounding in progress.  Call bell and belongings kept within reach.      Problem: Pain  Goal: Patient's pain/discomfort is manageable  Outcome: Progressing    Problem: Psychosocial and Spiritual Needs  Goal: Demonstrates ability to cope with hospitalization/illness  Outcome: Progressing    Problem: Neurological Deficit  Goal: Neurological status is stable or improving  Outcome: Progressing  Pt cleared by neurology.  Pt continues to have intermittent facial droop, slight right leg weakness.  No new symptoms at this time.      Problem: Chest Pain  Goal: Vital signs and cardiac rhythm stable  Outcome: Progressing  Intervention: Monitor/assess vital signs/cardiac rhythm.  VSS.  Pt remains NSR on telemetry.  Intervention: On Room Air (or pre-admission oxygen therapy baseline) within 3 hours of admission.  Tolerating room air, denies SOB.    Goal: Pain at/below Pt Goal: Patient comfortable  Outcome: Progressing  Pt c/o chest pain this morning, states it started in the ED and that tylenol was not helpful for pain control.  Dr. Seward Speck notified and telephone order given for IV morphine, q4h prn.  Pt states that she has tolerated morphine in the past, and used to have a  prescription for oral morphine at home.  Pain level decreases to 4/10, per pt report, after administration of morphine.  Pt denies SOB.    Comments:   Troponin ordered for 1500 and 2100.  Tech, [place], currently drawing lab in pt's room.

## 2015-08-19 NOTE — H&P (Signed)
[facility]      Patient: [name] Cook  Date: 08/18/2015   DOB: 19-Aug-1975  Admission Date: 08/18/2015   MRN: [id number]  Attending: Derek Mound         Chief Complaint   Patient presents with   . Facial Droop   . Chest Pain      History Gathered From: Patient     HISTORY AND PHYSICAL     [name] Cook is a 40 y.o. female with a PMHx of hypertension, seizures who presented with right sided facial droop that occurred around 6pm yesterday. Pt states that she was driving and facetiming her mother at the same time. Pt states that her mother said that her face looked different. Pt noted a facial droop. She denies weakness in legs or arms. Reports cp that started while she was in the ED. Pt denies any increased stressors in life. Denies tobacco/alcohol or drug use.     Past Medical History   Diagnosis Date   . Hypertension    . Seizures        History reviewed. No pertinent past surgical history.    Prior to Admission medications    Medication Sig Start Date End Date Taking? Authorizing Provider   LISINOPRIL PO Take by mouth.   Yes [provider]   LevETIRAcetam (KEPPRA PO) Take 500 mg by mouth 2 (two) times daily.       [provider]   lidocaine (LIDODERM) 5 % Place 1 patch onto the skin daily. Remove & Discard patch within 12 hours or as directed by MD 06/19/15   Berenice Bouton, MD       Allergies   Allergen Reactions   . Iodine    . Percocet [Oxycodone-Acetaminophen]    . Shellfish-Derived Products    . Toradol [Ketorolac Tromethamine]    . Tramadol        CODE STATUS: Full     PRIMARY CARE MD: Pcp, Noneorunknown, MD    Family History   Problem Relation Age of Onset   . Myocardial Infarction Father 35   . Deep vein thrombosis Father        Social History   Substance Use Topics   . Smoking status: Never Smoker    . Smokeless tobacco: None   . Alcohol Use: No       REVIEW OF SYSTEMS   Positive for: facial droop, cp, sob  Negative for: weakness, fevers/chills, anxiety/depression, headaches,  blurry vision, nausea/vomiting, abdominal pain, edema  All ROS completed and otherwise negative.    PHYSICAL EXAM     Vital Signs (most recent): BP 138/77 mmHg  Pulse 76  Temp(Src) 98.3 F (36.8 C) (Oral)  Resp 16  Wt 133.993 kg (295 lb 6.4 oz)  SpO2 98%  LMP 07/28/2015  Constitutional: No apparent distress. Patient speaks freely in full sentences.   HEENT: NC/AT, PERRL, no scleral icterus or conjunctival pallor, no nasal discharge, MMM, oropharynx without erythema or exudate  Neck: trachea midline, supple, no cervical or supraclavicular lymphadenopathy or masses  Cardiovascular: RRR, normal S1 S2, no murmurs, gallops, palpable thrills, no JVD, Non-displaced PMI.  Respiratory: Normal rate. No retractions or increased work of breathing. Clear to auscultation and percussion bilaterally.  Gastrointestinal: +BS, non-distended, soft, non-tender, no rebound or guarding, no hepatosplenomegaly  Genitourinary: no suprapubic or costovertebral angle tenderness  Musculoskeletal: ROM and motor strength grossly normal. No clubbing, edema, or cyanosis. DP and radial pulses 2+ and symmetric.  Skin exam:  pink  Neurologic: EOMI, CN II-XII intact, with the exception of right facial droop however intermittently when talking her droop disappears, tongue protruded to right however can move right and left. 5/5 strength in right upper and lower ext, 4/5 strength in left upper and lower ext, intact sensation b/l   Psychiatric: AAOx3, affect and mood appropriate. The patient is alert, interactive, appropriate.  Capillary refill:  Normal    Exam done by Derek Mound, MD on 08/19/2015 at 12:07 AM      LABS & IMAGING     Recent Results (from the past 24 hour(s))   Glucose Whole Blood - POCT    Collection Time: 08/18/15  7:50 PM   Result Value Ref Range    POCT - Glucose Whole blood 69 (L) 70 - 100 mg/dL   APTT    Collection Time: 08/18/15  7:50 PM   Result Value Ref Range    PTT 29 23 - 37 sec   CBC with differential    Collection  Time: 08/18/15  7:50 PM   Result Value Ref Range    WBC 10.86 (H) 3.50 - 10.80 x10 3/uL    Hgb 9.2 (L) 12.0 - 16.0 g/dL    Hematocrit 21.3 (L) 37.0 - 47.0 %    Platelets 218 140 - 400 x10 3/uL    RBC 4.00 (L) 4.20 - 5.40 x10 6/uL    MCV 77.5 (L) 80.0 - 100.0 fL    MCH 23.0 (L) 28.0 - 32.0 pg    MCHC 29.7 (L) 32.0 - 36.0 g/dL    RDW 16 (H) 12 - 15 %    MPV 11.6 9.4 - 12.3 fL    Neutrophils 64.5 None %    Lymphocytes Automated 27.5 None %    Monocytes 6.4 None %    Eosinophils Automated 1.3 None %    Basophils Automated 0.3 None %    Immature Granulocyte 0.2 None %    Nucleated RBC 0.0 0.0 - 1.0 /100 WBC    Neutrophils Absolute 7.01 1.80 - 8.10 x10 3/uL    Abs Lymph Automated 2.99 0.50 - 4.40 x10 3/uL    Abs Mono Automated 0.69 0.00 - 1.20 x10 3/uL    Abs Eos Automated 0.14 0.00 - 0.70 x10 3/uL    Absolute Baso Automated 0.03 0.00 - 0.20 x10 3/uL    Absolute Immature Granulocyte 0.02 0 x10 3/uL   Comprehensive metabolic panel    Collection Time: 08/18/15  7:50 PM   Result Value Ref Range    Glucose 65 (L) 70 - 100 mg/dL    BUN 08.6 7.0 - 57.8 mg/dL    Creatinine 1.0 0.6 - 1.0 mg/dL    Sodium 469 629 - 528 mEq/L    Potassium 3.6 3.5 - 5.1 mEq/L    Chloride 109 100 - 111 mEq/L    CO2 27 22 - 29 mEq/L    Calcium 9.1 8.5 - 10.5 mg/dL    Protein, Total 8.1 6.0 - 8.3 g/dL    Albumin 3.6 3.5 - 5.0 g/dL    AST (SGOT) 14 5 - 34 U/L    ALT 13 0 - 55 U/L    Alkaline Phosphatase 76 37 - 106 U/L    Bilirubin, Total 0.5 0.2 - 1.2 mg/dL    Globulin 4.5 (H) 2.0 - 3.6 g/dL    Albumin/Globulin Ratio 0.8 (L) 0.9 - 2.2    Anion Gap 5.0 5.0 - 15.0   Protime-INR    Collection Time:  08/18/15  7:50 PM   Result Value Ref Range    PT 12.7 12.6 - 15.0 sec    PT INR 0.9 0.9 - 1.1    PT Anticoag. Given Within 48 hrs. None    Hemolysis index    Collection Time: 08/18/15  7:50 PM   Result Value Ref Range    Hemolysis Index 4 0 - 18   GFR    Collection Time: 08/18/15  7:50 PM   Result Value Ref Range    EGFR >60.0      IMAGING:  CT head  No acute  cardiopulmonary disease.    Markers:        EMERGENCY DEPARTMENT COURSE:  Orders Placed This Encounter   Procedures   . CT Head without Contrast   . MRI Angio Head without Contrast   . MRI Brain With / Without Contrast   . Hemolysis index   . GFR   . APTT   . CBC with differential   . Comprehensive metabolic panel   . Protime-INR   . Hemolysis index   . GFR   . Complete MRI Screening Questionnaire   . Glucose POC   . Cardiac monitoring (ED ONLY)   . Continuous Pulse Oximetry   . Dysphagia Screen   . Nursing Stroke Protocol and NIHSS   . ED Unit Sec Comm Order   . ED Unit Sec Comm Order   . ED Unit Sec Comm Order   . Glucose Whole Blood - POCT   . ECG 12 Lead   . ECG 12 lead   . Saline lock IV   . Place (admit) for Observation Services   . [facility] ED Bed Request (Observation)       ASSESSMENT & PLAN     Chey Rachels is a 40 y.o. female admitted under [facility] with right facial droop    Patient Active Hospital Problem List:   Stroke-like symptoms (08/19/2015)   Chest pain   Right facial droop   Elevated BP   Seizures     - MRI normal per radiologist. I suspect pt in malingering vs conversion disorder. She c/o facial droop however intermittently her droop resolved. Pt refused tPA.  - no EKG changes  - placed on PTA meds  - neurology consulted, follow up recs in am     Nutrition  Cardiac diet     DVT/VTE Prophylaxis  Lovenox     [place] Pho    08/19/2015 12:07 AM  Time Elapsed: 45 minutes

## 2015-08-20 DIAGNOSIS — R569 Unspecified convulsions: Secondary | ICD-10-CM | POA: Diagnosis present

## 2015-08-20 MED ORDER — ASPIRIN 81 MG PO CHEW
81.0000 mg | CHEWABLE_TABLET | Freq: Every day | ORAL | Status: DC
Start: 2015-08-20 — End: 2017-04-15

## 2015-08-20 NOTE — Progress Notes (Signed)
08/20/15    CM met w/ the pt at bedside for initial assessment. Pt is A&Ox3, admitted w/ stroke-like symptoms.   Pt lives w/her children. She is independent w/ ADLs and ambulates w/o assistive devices.   Pt has skilld RN 3x/week; she does not remember the [facility] name.   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     DCP- home w/[facility]. No CM needs identified.     Garry Heater, RN MSN  Clinical Care Manager  [phone]     08/20/15 0840   Patient Type   Within 30 Days of Previous Admission? No   Medicare focused diagnosis patient? Not a Medicare focused diagnosis patient   Bundle patient? Not a bundle patient   Healthcare Decisions   Interviewed: Patient   Interviewee Contact Information: [phone]   Orientation/Decision Making Abilities of Patient Alert and Oriented x3, able to make decisions   Advance Directive Patient does not have advance directive   Healthcare Agent Appointed No   Prior to admission   Prior level of function Independent with ADLs;Ambulates independently   Type of Residence Private residence   Home Layout One level   Have running water, electricity, heat, etc? Yes   Living Arrangements Children   How do you get to your MD appointments? drives   How do you get your groceries? car   Who fixes your meals? self   Who does your laundry? self   Who picks up your prescriptions? self   Dressing Independent   Grooming Independent   Feeding Independent   Bathing Independent   Toileting Independent   DME Currently at Home Other (Comment)  (none)   Name of Prior Assisted Living Facility n/a   Home Care/Community Services Skilled nursing   Name of Agency cannot remember    Frequency of services 3xweek    Prior SNF admission? (Detail) no   Prior Rehab admission? (Detail) [facility] in [place] 2015   Adult Protective Services (APS) involved? No   Discharge Planning   Support Systems Family members   Patient expects to be discharged to: home self-care   Anticipated [place] plan discussed with: Patient   Follow up  appointment scheduled? No   Reason no follow up scheduled? Family to schedule   Mode of transportation: Private car (family member)   Consults/Providers   PT Evaluation Needed 2   OT Evalulation Needed 2   SLP Evaluation Needed 2   Correct PCP listed in Epic? Yes   Important Message from [facility] Notice   Patient received 1st IMM Letter? n/a

## 2015-08-20 NOTE — Progress Notes (Signed)
Patient discharged home. Discharge instruction's and follow-up appointment's given, patient verbalized understanding. Denies pain, SOB, or palpitations. IV access line and telemetry monitoring box removed. Patient left the unit in stable condition. No distress noted.

## 2015-08-20 NOTE — Plan of Care (Signed)
Problem: Chest Pain  Goal: Vital signs and cardiac rhythm stable  Outcome: Progressing  The patient and care giver's learning abilities have been assessed. Today's individualized plan of care is to monitor vitals, tele, pain, neuro checks, and implement safety precautions. Pt is SB on the monitor; HR at 58. C/o chest pain. Prn pain medication was given. Second troponin was negative. This plan was discussed with the patient and care giver and agree to it. Patient and care giver demonstrates understanding of disease process, treatment plan, medications and consequences of noncompliance. All questions and concerns were addressed.

## 2015-08-20 NOTE — Progress Notes (Signed)
D/c home w/ [facility] (pt cannot remember the agency's name), skilled RN 3x/week. Pt will drive herself home. No CM needs identified.     Garry Heater, RN MSN  Clinical Care Manager  [phone]     08/20/15 [phone]   Discharge Disposition   Patient preference/choice provided? N/A   Physical Discharge Disposition Home with Needs   Name of Home Health Agency Other (comment box)  ([facility])   Mode of Transportation Car   Pick up time 0900   Patient/Family/POA notified of transfer plan Patient informed only   Patient agreeable to discharge plan/expected d/c date? Yes   Bedside nurse notified of transport plan? Yes   Outpatient Services   Home Health Skilled Nursing   CM Interventions   Follow up appointment scheduled? No   Reason no follow up scheduled? Family to schedule   Referral made for home health RN visit? No, Other (comment)   Multidisciplinary rounds/family meeting before d/c? No   Medicare Checklist   Is this a Medicare patient? No

## 2015-08-20 NOTE — Discharge Instructions (Signed)
Symptoms With Uncertain Cause (Adult)  You have been examined, and tests may have been done. However, the exact cause of your symptoms is still not certain. Watch for any new symptoms or worsening of your condition. Another exam or more testing at a later time may be needed. Unless told otherwise, you can go back to your normal routine.  Follow-up care  Follow up with your healthcare providerif your symptoms do not begin to improve in the next few days,or as advised by our staff.  When to seek medical advice  Call your healthcare provider if your symptoms get worse or if new symptoms appear.  Date Last Reviewed: 09/05/2013   2000-2016 [employer] [address], [place] [address]. All rights reserved. This information is not intended as a substitute for professional medical care. Always follow your healthcare professional's instructions.

## 2016-03-02 NOTE — Consults (Signed)
NEUROLOGY CONSULTATION NOTE  Note Type: 2    Name: [name] Cook, [name] Cook  MRN: [id number]  Date of Birth: [birth year]  Date of Admission: 03/02/2016  Hospital Length of Stay: 0    Referring Attending: Madelynn Done, *  Referring Provider: Dr. Almira Coaster  Referring Service: Adult Emergency Medicine  Consulting Attending: Dr. Benancio Deeds  Consulting Provider: Dr. Johney Maine    History of Present Illness:  [name] Cook is a 40 year old woman with a past medical history remarkable for obesity, anxiety, and "seizure disorder" who presents to the ED with sudden-onset right face and leg weakness that began on waking two days ago.    Per [facility] records available in [facility], [name] Cook has been "hospitalized several times in the last 2 years for recurrent symptoms of facial droop and leg weakness that she describes as 'stroke'". Since 09/2013, she has had multiple admissions with a similar constellation of symptoms without findings on MRI (left or right-sided weakness with facial droop). This year, "she was hospitalized at [facility] on 01/25/2016 with negative MRI of the brain. Then, she was hospitalized at [facility] on 02/03/2016, again with negative MRI of the brain. In the morning of 02/20/2016, patient started to complain of right leg weakness and left facial droop, no slurred speech, no chest pain, no shortness of breath. This time, 911 was called and the patient was taken to [facility]. According to the documentation, the patient was able to ambulate well in the Emergency Department. Another MRI of the brain was done on 02/22/2016 which did not show any evidence of acute infarct." She remained admitted until 02/25/2016.    On 12/19, she reports that she woke up with painless weakness of the right side of her face and dragging her right leg. No injuries or falls. No preceding fevers or chills. No changes in her vision. She came to the ED because the symptoms persisted.     Our  only records of her in our system are from a cardiology note in 2013 for palpitations. Per that encounter, "She has had frequent visits to multiple emergency departments over the past year and her symptoms are concerning for possible panic disorder with panic attacks in the absence of any evidence of organic heart disease. I would recommend that she be evaluated by a mental health provider with a specialty in panic disorder."    Review of Symptoms  A comprehensive 12 point Review of Systems (including neurologic ROS) was reviewed and was negative except as noted in HPI.    PAST MEDICAL HISTORY:   Morbid obesity.   Hypertension.   Hyperlipidemia.   History of epilepsy.     DRUG ALLERGIES:   1. IV CONTRAST, which causes anaphylaxis.   2. PERCOCET.   3. TRAMADOL.   4. TORADOL caused skin swelling and itchiness.     HOME MEDICATIONS: 1. Plavix 75 mg p.o. daily. 2. Protonix 40 mg p.o. daily. 3. Keppra 500 mg p.o. twice a day. 4. Lisinopril 5 mg p.o. daily. 5. Atorvastatin 40 mg p.o. daily at bedtime.     SOCIAL HISTORY: No smoking, no alcohol.     FAMILY HISTORY: Father had stroke.     Allergies  Allergies   Allergen Reactions   . Contrast [Iohexol] Anaphylaxis   . Nsaids (Non-Steroidal Anti-Inflammatory Drug) Anaphylaxis   . Toradol [Ketorolac] Anaphylaxis   . Tramadol Anaphylaxis     Physical Exam   Vital Signs  Temp: 36.5 C (97.7 F), Heart  Rate: 73, Resp: 20, BP: 157/82, SpO2: 97 %, O2 Device: None-room air, Pain Score: 6   General Exam  Constitutional: comfortable  Psychiatric: cooperative with pleasant affect  Head: normocephalic  Neck: no nuchal rigidity  Eyes: normal sclera without injection  Ears: normal pinnae without drainage  Nose: no nasal deformity  Sinuses: no sinus drainage  Cardiovascular: good pulses with regular rate and rhythm  Respiratory: breathing comfortably  Gastrointestinal: abdomen soft without distention or tenderness  Musculoskeletal: warm extremities without joint deformities  Skin: no  rashes or jaundice   Neurological Examination  Neurological examination:    Mental status:   Level of alertness/orientation: alert and oriented to person, place, time, and situation.  Attention/Concentration: Able to name days of week backwards correctly and quickly.  Memory: normal recent and remote memory.   Speech/Language: No dysarthria. No aphasia. Normal fluency. Normal Comprehension. Repetition testing to simple/complex sentences was normal. Able to follow one and two-step commands.   Parietal signs: Right/Left confusion was not present. Evidence of neglect is absent.  No apraxia    Cranial Nerves:                I  not tested   II Normal visual fields to confrontation and finger counting.    III, IV, VI Pupils were symmetrical, briskly reactive. Eye movements are full without evidence of nystagmus. No ptosis. No afferent pupillary defect.   V  Facial sensation is normal to light touch.   VII  Alternating facial droop.   VIII Hearing is normal to finger rub.   IX, X  Symmetric palate elevation   XI Shoulder shrug and head turn is normal   XII Tongue protrudes in the midline without atrophy.      Motor examination:   Tone is normal throughout. Bulk evaluation shows no atrophy. Pronator drift is absent. Orbit sign is absent.Finger tapping normal. Significant giveaway weakness throughout right leg.    Strength:   0-No movement. 1-Muscle contraction 2-Full range. 3-Antigravity 4-Breakable. 5-Full.     Action Right Left  Muscle Nerve Root   Shoulder abduction 5 5  deltoid axillary C5-6   Elbow flexion 5 5  Biceps,brachio. musculocut. C5-6   Wrist extension 5 5  extensors radial C5-C6   Elbow extension 5 5  triceps radial C6,7,8   Finger extension 5 5  extensors radial C7-8   Finger flexion 5 5  flex dig prof median/ulnar C7-8   Hip flexion 4 5  iliopsoas femoral L1,2,3,4   Knee extension 5 5  quadriceps femoral L2,3,4   Knee flexion 5 5   hamstrings sciatic L5, S1,2   Foot dorsiflexion 3 5  tib ant deep peroneal L4-5   Foot plantarflexion 5 5  gastroc/soleus tibial S1-S2     Reflexes:   Root Right Left Comment   Biceps C5 2+ 2+    Brachioradialis C6 2+ 2+    Triceps C7 2+ 2+    Patella L3 (L4) 2+ 2+    Achilles S1 (S2) 2+ 2+    Jaw jerk CN V      Pectoralis C5-T1      Hoffman C8-T1      Crossed adductors L2-L4      Plantar response S1-S2 Flexor Flexor      Sensation:   Diminished sensation to vibration and cold throughout right foot, preserved at ankle and above.     Coordination:   Finger-to-nose and heel-to-shin are normal.   Testing for dysdiadokinesia is normal.  Gait:   Able to ambulate without support. Drags right foot en bloc. Able to heel- and toe-stand with assistance.    Results/Diagnostic Studies  142 102 14 88 12/21 0143 10.42 10.6* 202 12/21 0143   3.8 26 0.9    35.5*       MRI brain without contrast 12/12:  This study is mildly limited by motion artifact.    There is no evidence of acute hemorrhage, midline shift, or mass  effect. No extra-axial fluid collections are present. The ventricles  are symmetric in size and configuration without hydrocephalus. There  are 2 small foci of T2 prolongation in the left frontal subcortical  region (series 6, images 23 and 20) that are nonspecific. The  remainder of the gray-white matter differentiation is well preserved.   There are no areas of restricted diffusion to suggest acute infarct.  The posterior fossa structures are unremarkable. The vascular flow  voids at the skull base are within normal limits. The periorbital  regions are within normal limits.    The pituitary is normal in size and morphology. The brainstem and  cervicomedullary junction are normal.    The visualized paranasal sinuses and mastoid air cells are largely  clear.    There are no suspicious osseous lesions.    IMPRESSION:  1. No evidence of acute  infarct.  2. Two small foci of T2 prolongation in the left frontal subcortical  white matter are nonspecific and of uncertain clinical significance.    MRI brain 11/15:  Ventricular caliber is within normal limits for patient's age. There is no intraparenchymal mass effect or mass lesion. Few scattered left frontal subcortical white matter signal abnormalities of uncertain clinical significance. Diffusion weighted images show no abnormal areas of restricted diffusion. There is no abnormal susceptibility artifact on gradient echo images to suggest hemorrhage. There is no acute intraparenchymal hemorrhage. There is no extra-axial collection. There is no mass effect or midline shift. There is no sellar or suprasellar mass. The craniocervical junction is normal. The skull base vascular flow-related signal voids are normal. Mild paranasal sinus mucosal thickening. The orbital contents are unremarkable. Trace bilateral mastoid effusions. IMPRESSION: No acute infarct. No significant white matter signal abnormalities. Trace bilateral mastoid effusions.     MRI brain 09/06/2015:  BRAIN: No acute intracranial hemorrhage, infarction, mass effect, or midline shift.  CSF SPACES: Ventricles, cisterns, and sulci are appropriate for age.No hydrocephalus, subarachnoid hemorrhage, or mass.  SKULL: No mass or other significant visible lesion.  SINUSES: Limited views demonstrate no significant mucosal thickening or fluid.  ORBITS: Limited views are unremarkable.  OTHER: Negative.    CONCLUSION: No acute disease.    Assessment  Ms. Surgeon is a 40 year old woman with a past medical history remarkable for obesity, anxiety, and "seizure disorder" who presents to the ED with sudden-onset right face and leg weakness that began on waking two days ago. She has been worked up multiple times in the last two years with MRI brain imaging for the same constellation of symptoms (facial droop and dragging a foot) with no changes in her  history or documented examination. Her exam is remarkable for significant functional overlay, with a facial droop that alternates side and en bloc dragging of her foot without difficulty ambulating. Although there is always concern for some organic finding underlying functional symptoms, she has preserved achilles and other reflexes bilaterally without upgoing toes or other pathologic findings. Given the frequency of these symptoms, the fact that they alternate sides and resolve  completely, and the number of imaging studies she has had recently, I would not recommend further imaging at this time.     Recommendations  - no further imaging indicated at this time.    NEUROLOGYCONSULT V1.14    Attending Addendum Wallene Huh):    I saw and evaluated the patient on 03/02/16. I have reviewed the above note and have made any necessary edits or additions. Additional comments are that more than half of this 55 minute visit was dedicated to counseling, discussing, and reviewing the above assessment and plan. Ms. Vandermolen neurological exam appeared non-physiologic. Recommend neurological and mental health follow up as an outpatient. No acute neurological intervention or work up required at this time.

## 2016-05-11 ENCOUNTER — Inpatient Hospital Stay: Payer: Self-pay | Admitting: Internal Medicine

## 2016-05-11 ENCOUNTER — Emergency Department: Payer: Medicaid HMO

## 2016-05-11 ENCOUNTER — Inpatient Hospital Stay
Admission: EM | Admit: 2016-05-11 | Discharge: 2016-05-13 | DRG: 054 | Disposition: A | Discharge status: Home / Self Care | Payer: Medicaid HMO | Attending: Internal Medicine | Admitting: Internal Medicine

## 2016-05-11 DIAGNOSIS — Z8673 Personal history of transient ischemic attack (TIA), and cerebral infarction without residual deficits: Secondary | ICD-10-CM

## 2016-05-11 DIAGNOSIS — Z7982 Long term (current) use of aspirin: Secondary | ICD-10-CM

## 2016-05-11 DIAGNOSIS — I639 Cerebral infarction, unspecified: Secondary | ICD-10-CM

## 2016-05-11 DIAGNOSIS — R29704 NIHSS score 4: Secondary | ICD-10-CM | POA: Diagnosis present

## 2016-05-11 DIAGNOSIS — E669 Obesity, unspecified: Secondary | ICD-10-CM | POA: Diagnosis present

## 2016-05-11 DIAGNOSIS — Z6841 Body Mass Index (BMI) 40.0 and over, adult: Secondary | ICD-10-CM | POA: Diagnosis present

## 2016-05-11 DIAGNOSIS — E785 Hyperlipidemia, unspecified: Secondary | ICD-10-CM | POA: Diagnosis present

## 2016-05-11 DIAGNOSIS — I10 Essential (primary) hypertension: Secondary | ICD-10-CM | POA: Diagnosis present

## 2016-05-11 DIAGNOSIS — R29810 Facial weakness: Secondary | ICD-10-CM | POA: Diagnosis present

## 2016-05-11 DIAGNOSIS — E7800 Pure hypercholesterolemia, unspecified: Secondary | ICD-10-CM | POA: Diagnosis present

## 2016-05-11 DIAGNOSIS — G43109 Migraine with aura, not intractable, without status migrainosus: Secondary | ICD-10-CM | POA: Diagnosis present

## 2016-05-11 DIAGNOSIS — G44209 Tension-type headache, unspecified, not intractable: Principal | ICD-10-CM | POA: Diagnosis present

## 2016-05-11 DIAGNOSIS — Z7902 Long term (current) use of antithrombotics/antiplatelets: Secondary | ICD-10-CM

## 2016-05-11 HISTORY — DX: Pure hypercholesterolemia, unspecified: E78.00

## 2016-05-11 HISTORY — DX: Cerebral infarction, unspecified: I63.9

## 2016-05-11 LAB — URINALYSIS WITH MICROSCOPIC
Bilirubin, UA: NEGATIVE
Blood, UA: NEGATIVE
Glucose, UA: NEGATIVE
Ketones UA: NEGATIVE
Nitrite, UA: NEGATIVE
Protein, UR: NEGATIVE
Specific Gravity UA: 1.02 (ref 1.001–1.035)
Urine pH: 7 (ref 5.0–8.0)
Urobilinogen, UA: 4 mg/dL — AB

## 2016-05-11 LAB — CBC AND DIFFERENTIAL
Absolute NRBC: 0 10*3/uL
Basophils Absolute Automated: 0.05 10*3/uL (ref 0.00–0.20)
Basophils Automated: 0.6 %
Eosinophils Absolute Automated: 0.16 10*3/uL (ref 0.00–0.70)
Eosinophils Automated: 2 %
Hematocrit: 28.8 % — ABNORMAL LOW (ref 37.0–47.0)
Hgb: 8.6 g/dL — ABNORMAL LOW (ref 12.0–16.0)
Immature Granulocytes Absolute: 0.01 10*3/uL
Immature Granulocytes: 0.1 %
Lymphocytes Absolute Automated: 2.14 10*3/uL (ref 0.50–4.40)
Lymphocytes Automated: 27.3 %
MCH: 23.4 pg — ABNORMAL LOW (ref 28.0–32.0)
MCHC: 29.9 g/dL — ABNORMAL LOW (ref 32.0–36.0)
MCV: 78.3 fL — ABNORMAL LOW (ref 80.0–100.0)
MPV: 13.4 fL — ABNORMAL HIGH (ref 9.4–12.3)
Monocytes Absolute Automated: 0.7 10*3/uL (ref 0.00–1.20)
Monocytes: 8.9 %
Neutrophils Absolute: 4.77 10*3/uL (ref 1.80–8.10)
Neutrophils: 61.1 %
Nucleated RBC: 0 /100 WBC (ref 0.0–1.0)
Platelets: 175 10*3/uL (ref 140–400)
RBC: 3.68 10*6/uL — ABNORMAL LOW (ref 4.20–5.40)
RDW: 17 % — ABNORMAL HIGH (ref 12–15)
WBC: 7.83 10*3/uL (ref 3.50–10.80)

## 2016-05-11 LAB — COMPREHENSIVE METABOLIC PANEL
ALT: 19 U/L (ref 0–55)
AST (SGOT): 20 U/L (ref 5–34)
Albumin/Globulin Ratio: 0.9 (ref 0.9–2.2)
Albumin: 3.7 g/dL (ref 3.5–5.0)
Alkaline Phosphatase: 81 U/L (ref 37–106)
Anion Gap: 7 (ref 5.0–15.0)
BUN: 7 mg/dL (ref 7.0–19.0)
Bilirubin, Total: 0.6 mg/dL (ref 0.2–1.2)
CO2: 26 mEq/L (ref 22–29)
Calcium: 9.1 mg/dL (ref 8.5–10.5)
Chloride: 107 mEq/L (ref 100–111)
Creatinine: 0.8 mg/dL (ref 0.6–1.0)
Globulin: 4 g/dL — ABNORMAL HIGH (ref 2.0–3.6)
Glucose: 91 mg/dL (ref 70–100)
Potassium: 3.8 mEq/L (ref 3.5–5.1)
Protein, Total: 7.7 g/dL (ref 6.0–8.3)
Sodium: 140 mEq/L (ref 136–145)

## 2016-05-11 LAB — TROPONIN I: Troponin I: <0.01 ng/mL (ref 0.00–0.09)

## 2016-05-11 LAB — HEMOLYSIS INDEX: Hemolysis Index: 16 (ref 0–18)

## 2016-05-11 LAB — GLUCOSE WHOLE BLOOD - POCT: Whole Blood Glucose POCT: 105 mg/dL — ABNORMAL HIGH (ref 70–100)

## 2016-05-11 LAB — APTT: PTT: 31 s (ref 23–37)

## 2016-05-11 LAB — PT/INR
PT INR: 1 (ref 0.9–1.1)
PT: 12.9 s (ref 12.6–15.0)

## 2016-05-11 LAB — GFR: EGFR: >60

## 2016-05-11 MED ORDER — ASPIRIN 81 MG PO CHEW
81.0000 mg | CHEWABLE_TABLET | Freq: Every day | ORAL | Status: DC
Start: 2016-05-12 — End: 2016-05-13
  Administered 2016-05-12 – 2016-05-13 (×2): 81 mg via ORAL
  Filled 2016-05-11 (×2): qty 1

## 2016-05-11 MED ORDER — MORPHINE SULFATE 2 MG/ML IJ/IV SOLN (WRAP)
2.0000 mg | Status: DC | PRN
Start: 2016-05-11 — End: 2016-05-13
  Administered 2016-05-11 – 2016-05-13 (×7): 2 mg via INTRAVENOUS
  Filled 2016-05-11 (×7): qty 1

## 2016-05-11 MED ORDER — LORAZEPAM 2 MG/ML IJ SOLN
1.0000 mg | Freq: Once | INTRAMUSCULAR | Status: AC | PRN
Start: 2016-05-11 — End: 2016-05-12
  Administered 2016-05-12: 21:00:00 1 mg via INTRAVENOUS
  Filled 2016-05-11: qty 1

## 2016-05-11 MED ORDER — NALOXONE HCL 0.4 MG/ML IJ SOLN (WRAP)
0.2000 mg | INTRAMUSCULAR | Status: DC | PRN
Start: 2016-05-11 — End: 2016-05-13

## 2016-05-11 MED ORDER — ACETAMINOPHEN 650 MG RE SUPP
650.0000 mg | RECTAL | Status: DC | PRN
Start: 2016-05-11 — End: 2016-05-13

## 2016-05-11 MED ORDER — HEPARIN SODIUM (PORCINE) 5000 UNIT/ML IJ SOLN
5000.0000 [IU] | Freq: Two times a day (BID) | INTRAMUSCULAR | Status: DC
Start: 2016-05-11 — End: 2016-05-13
  Administered 2016-05-11 – 2016-05-13 (×4): 5000 [IU] via SUBCUTANEOUS
  Filled 2016-05-11 (×4): qty 1

## 2016-05-11 MED ORDER — ATORVASTATIN CALCIUM 40 MG PO TABS
40.0000 mg | ORAL_TABLET | Freq: Every evening | ORAL | Status: DC
Start: 2016-05-11 — End: 2016-05-13
  Administered 2016-05-12: 40 mg via ORAL
  Filled 2016-05-11: qty 1

## 2016-05-11 MED ORDER — LISINOPRIL 20 MG PO TABS
40.0000 mg | ORAL_TABLET | Freq: Every day | ORAL | Status: DC
Start: 2016-05-12 — End: 2016-05-13
  Administered 2016-05-12 – 2016-05-13 (×2): 40 mg via ORAL
  Filled 2016-05-11 (×2): qty 2

## 2016-05-11 MED ORDER — MAGNESIUM SULFATE IN D5W 1-5 GM/100ML-% IV SOLN
1.0000 g | Freq: Once | INTRAVENOUS | Status: AC
Start: 2016-05-11 — End: 2016-05-11
  Administered 2016-05-11: 22:00:00 1 g via INTRAVENOUS
  Filled 2016-05-11: qty 100

## 2016-05-11 MED ORDER — ASPIRIN 325 MG PO TABS
325.0000 mg | ORAL_TABLET | Freq: Once | ORAL | Status: AC
Start: 2016-05-11 — End: 2016-05-11
  Administered 2016-05-11: 21:00:00 325 mg via ORAL
  Filled 2016-05-11: qty 1

## 2016-05-11 MED ORDER — ONDANSETRON 4 MG PO TBDP
4.0000 mg | ORAL_TABLET | Freq: Four times a day (QID) | ORAL | Status: DC | PRN
Start: 2016-05-11 — End: 2016-05-13

## 2016-05-11 MED ORDER — LEVETIRACETAM 500 MG PO TABS
500.0000 mg | ORAL_TABLET | Freq: Two times a day (BID) | ORAL | Status: DC
Start: 2016-05-11 — End: 2016-05-13
  Administered 2016-05-11 – 2016-05-13 (×4): 500 mg via ORAL
  Filled 2016-05-11 (×4): qty 1

## 2016-05-11 MED ORDER — ACETAMINOPHEN 325 MG PO TABS
650.0000 mg | ORAL_TABLET | ORAL | Status: DC | PRN
Start: 2016-05-11 — End: 2016-05-13
  Administered 2016-05-13: 650 mg via ORAL
  Filled 2016-05-11: qty 2

## 2016-05-11 MED ORDER — ACETAMINOPHEN 500 MG PO TABS
1000.0000 mg | ORAL_TABLET | Freq: Once | ORAL | Status: AC
Start: 2016-05-11 — End: 2016-05-11
  Administered 2016-05-11: 22:00:00 1000 mg via ORAL
  Filled 2016-05-11: qty 2

## 2016-05-11 MED ORDER — ONDANSETRON HCL 4 MG/2ML IJ SOLN
4.0000 mg | Freq: Four times a day (QID) | INTRAMUSCULAR | Status: DC | PRN
Start: 2016-05-11 — End: 2016-05-13

## 2016-05-11 MED ORDER — CLOPIDOGREL BISULFATE 75 MG PO TABS
75.0000 mg | ORAL_TABLET | Freq: Every day | ORAL | Status: DC
Start: 2016-05-12 — End: 2016-05-13
  Administered 2016-05-12 – 2016-05-13 (×2): 75 mg via ORAL
  Filled 2016-05-11 (×2): qty 1

## 2016-05-11 NOTE — Stroke Progress Note (Addendum)
Date: 05/11/2016 Time: 8:08 PM    Stroke team notified: 5    41 y.o. right-handed female    Last known well: 05/11/16 @ 1000  Cardiovascular risk factors: Hypertension (HBP), TIAs, Hyperlipidemia (High Chol) and Obesity  Prior stroke: yes, exact type of CVA unknown  Pre-stroke mRS: 0 = No symptoms at all  Patient on anticoagulation: Yes: Plavix  Initial NIH Stroke Score: 4  Dysphagia screen was not performed.    CT without contrast was ordered,  CT not complete results to follow upon completion    Pt is not a candidate for IV-tPA since NIH=4, L side facial droop, R side arm drift, R side leg drift, reduced sensation on R side; LKW more than 4.5H.    MD He notified.  MD Vadlamudi called and not CTA or intervention ordered at this time..   Stroke education started. Plan of care discussed with patient .  Findings and plan discussed with emergency MD and RN. Will follow as appropriate.              Stroke Team RN Signature: Maud Deed        Modified Rankin Scale:  0- No symptoms at all   1- No significant disability despite symptoms; able to carry out all usual duties and activities   2- Slight disability; unable to carry out all previous activities, but able to look after own affairs without assistance   3- Moderate disability; requiring some help, but able to walk without assistance   4- Moderately severe disability; unable to walk without assistance and unable to attend to own bodily needs without assistance    5- Severe disability; bedridden, incontinent and requiring constant nursing care and attention

## 2016-05-11 NOTE — Progress Notes (Signed)
Results of CT  "No CT evidence of an acute intracranial abnormality"

## 2016-05-11 NOTE — H&P (Signed)
[facility]      Patient: [name] Cook  Date: 05/11/2016   DOB: 1975/07/17  Admission Date: 05/11/2016   MRN: [id number]  Attending: Yates Decamp         Chief Complaint   Patient presents with   . Facial Droop   . Headache   . Chest Pain      History Gathered From: patient    HISTORY AND PHYSICAL     [name] Cook is a 41 y.o. female with a PMHx of HTN,CVA 08/2013,seizure presented to Er for chest pain.headache, Rt sided weakness and numbness, left facial droop. She states about the Rt sided headache for couple of days. She developed chest pain at sternal area with severiy 7/10 associated with  SOB,headache and dizziness,nausea   .Also she has noticed left  facial droop  With weakness/numbness Rt upper and lower ext . She denies fever, chills or seizures.work up in ER showed unremarkable ct head and CXR, normal trop and EKG. She remained with Rt sided weakness and left facial droop  and now she is going to be admitted for further evaluation and treatment     Past Medical History:   Diagnosis Date   . Hypercholesteremia    . Hypertension    . Seizures    . Stroke     2015 and 2017   . TIA (transient ischemic attack) 2017       Past Surgical History:   Procedure Laterality Date   . APPENDECTOMY         Prior to Admission medications    Medication Sig Start Date End Date Taking? Authorizing Provider   aspirin 81 MG chewable tablet Chew 1 tablet (81 mg total) by mouth daily.  Patient taking differently: Chew 325 mg by mouth daily.     08/20/15  Yes Rane, Megan Mans, MD   clopidogrel (PLAVIX) 75 mg tablet Take by mouth.   Yes [provider]   LevETIRAcetam (KEPPRA PO) Take 500 mg by mouth 2 (two) times daily.      Yes [provider]   LISINOPRIL PO Take 40 mg by mouth.       Yes [provider]   atorvastatin (LIPITOR) 40 MG tablet Take by mouth.    [provider]   lidocaine (LIDODERM) 5 % Place 1 patch onto the skin daily. Remove & Discard patch within 12 hours or as  directed by MD 06/19/15   Berenice Bouton, MD       Allergies   Allergen Reactions   . Contrast [Iodinated Diagnostic Agents]    . Iodine    . Percocet [Oxycodone-Acetaminophen]    . Shellfish-Derived Products    . Toradol [Ketorolac Tromethamine]    . Tramadol        CODE STATUS: full code    PRIMARY CARE MD: Pcp, [facility], MD    Family History   Problem Relation Age of Onset   . Myocardial Infarction Father 90   . Deep vein thrombosis Father        Social History   Substance Use Topics   . Smoking status: Never Smoker   . Smokeless tobacco: Never Used   . Alcohol use No       REVIEW OF SYSTEMS   Positive for: chest pain ,headache,dizziness , Rt sided weakness and numbness, left facial droop     All ROS completed and otherwise negative.    PHYSICAL EXAM     Vital Signs (most recent):  BP 159/76   Pulse 60   Temp 98.2 F (36.8 C) (Oral)   Resp 18   Ht 1.778 m (5\' 10" )   Wt 142 kg (313 lb)   LMP 04/23/2016 (Exact Date)   SpO2 98%   BMI 44.91 kg/m   Constitutional: feeling weak and fatigue   HEENT: NC/AT, PERRL, no scleral icterus or conjunctival pallor, no nasal discharge,, oropharynx without erythema or exudate  Neck: trachea midline, supple, no cervical or supraclavicular lymphadenopathy or masses  Cardiovascular:  S1 S2, no murmurs, gallops, palpable thrills, no JVD  Respiratory: Normal rate. No retractions or increased work of breathing. Clear to auscultation and percussion bilaterally.  Gastrointestinal: +BS, non-distended, soft, non-tender, no rebound or guarding, no hepatosplenomegaly  Genitourinary: no suprapubic or costovertebral angle tenderness  Musculoskeletal: No clubbing, edema, or cyanosis. DP and radial pulses 2+ and symmetric.  Skin exam:  No rashes   Neurologic: left facial droop, Rt upper and lower ext with power 4/5  Psychiatric: AAOx3, affect and mood appropriate. The patient is alert, interactive, appropriate.    Exam done by Yates Decamp, MD on 05/11/16 at 9:37 PM      LABS  & IMAGING     Recent Results (from the past 24 hour(s))   Glucose Whole Blood - POCT    Collection Time: 05/11/16  8:21 PM   Result Value Ref Range    POCT - Glucose Whole blood 105 (H) 70 - 100 mg/dL   CBC with differential    Collection Time: 05/11/16  8:22 PM   Result Value Ref Range    WBC 7.83 3.50 - 10.80 x10 3/uL    Hgb 8.6 (L) 12.0 - 16.0 g/dL    Hematocrit 96.2 (L) 37.0 - 47.0 %    Platelets 175 140 - 400 x10 3/uL    RBC 3.68 (L) 4.20 - 5.40 x10 6/uL    MCV 78.3 (L) 80.0 - 100.0 fL    MCH 23.4 (L) 28.0 - 32.0 pg    MCHC 29.9 (L) 32.0 - 36.0 g/dL    RDW 17 (H) 12 - 15 %    MPV 13.4 (H) 9.4 - 12.3 fL    Neutrophils 61.1 None %    Lymphocytes Automated 27.3 None %    Monocytes 8.9 None %    Eosinophils Automated 2.0 None %    Basophils Automated 0.6 None %    Immature Granulocyte 0.1 None %    Nucleated RBC 0.0 0.0 - 1.0 /100 WBC    Neutrophils Absolute 4.77 1.80 - 8.10 x10 3/uL    Abs Lymph Automated 2.14 0.50 - 4.40 x10 3/uL    Abs Mono Automated 0.70 0.00 - 1.20 x10 3/uL    Abs Eos Automated 0.16 0.00 - 0.70 x10 3/uL    Absolute Baso Automated 0.05 0.00 - 0.20 x10 3/uL    Absolute Immature Granulocyte 0.01 0 x10 3/uL    Absolute NRBC 0.00 0 x10 3/uL   Comprehensive metabolic panel    Collection Time: 05/11/16  8:22 PM   Result Value Ref Range    Glucose 91 70 - 100 mg/dL    BUN 7.0 7.0 - 95.2 mg/dL    Creatinine 0.8 0.6 - 1.0 mg/dL    Sodium 841 324 - 401 mEq/L    Potassium 3.8 3.5 - 5.1 mEq/L    Chloride 107 100 - 111 mEq/L    CO2 26 22 - 29 mEq/L    Calcium 9.1 8.5 - 10.5 mg/dL  Protein, Total 7.7 6.0 - 8.3 g/dL    Albumin 3.7 3.5 - 5.0 g/dL    AST (SGOT) 20 5 - 34 U/L    ALT 19 0 - 55 U/L    Alkaline Phosphatase 81 37 - 106 U/L    Bilirubin, Total 0.6 0.2 - 1.2 mg/dL    Globulin 4.0 (H) 2.0 - 3.6 g/dL    Albumin/Globulin Ratio 0.9 0.9 - 2.2    Anion Gap 7.0 5.0 - 15.0   Protime-INR    Collection Time: 05/11/16  8:22 PM   Result Value Ref Range    PT 12.9 12.6 - 15.0 sec    PT INR 1.0 0.9 - 1.1    PT  Anticoag. Given Within 48 hrs. None    APTT    Collection Time: 05/11/16  8:22 PM   Result Value Ref Range    PTT 31 23 - 37 sec   Troponin I    Collection Time: 05/11/16  8:22 PM   Result Value Ref Range    Troponin I <0.01 0.00 - 0.09 ng/mL   UA with microscopic (pts <  3 yrs)    Collection Time: 05/11/16  8:22 PM   Result Value Ref Range    Urine Type Clean Catch     Color, UA Yellow Clear - Yellow    Clarity, UA Sl Cloudy (A) Clear - Hazy    Specific Gravity UA 1.020 1.001 - 1.035    Urine pH 7.0 5.0 - 8.0    Leukocyte Esterase, UA Large (A) Negative    Nitrite, UA Negative Negative    Protein, UR Negative Negative    Glucose, UA Negative Negative    Ketones UA Negative Negative    Urobilinogen, UA 4.0 (A) 0.2 - 2.0 mg/dL    Bilirubin, UA Negative Negative    Blood, UA Negative Negative    RBC, UA 0 - 2 0 - 5 /hpf    WBC, UA 0 - 5 0 - 5 /hpf    Squamous Epithelial Cells, Urine 11 - 25 0 - 25 /hpf   Hemolysis index    Collection Time: 05/11/16  8:22 PM   Result Value Ref Range    Hemolysis Index 16 0 - 18   GFR    Collection Time: 05/11/16  8:22 PM   Result Value Ref Range    EGFR >60.0            Recent Labs  Lab 05/11/16  2022   Troponin I <0.01       EMERGENCY DEPARTMENT COURSE:  Orders Placed This Encounter   Procedures   . Urine Culture   . CT Head without Contrast   . Chest AP Portable   . CBC with differential   . Comprehensive metabolic panel   . Protime-INR   . APTT   . Troponin I   . UA with microscopic (pts <  3 yrs)   . Hemolysis index   . GFR   . Glucose POC   . Cardiac monitoring (ED ONLY)   . Continuous Pulse Oximetry   . Dysphagia Screen   . Nursing Stroke Protocol and NIHSS   . ED Unit Sec Comm Order   . Glucose Whole Blood - POCT   . ECG 12 Lead   . ECG 12 lead   . Saline lock IV   . Admit to Inpatient   . [facility] ED Bed Request (Inpatient)       ASSESSMENT &  PLAN     Rt sided weakness.left facial droop  - likely due to acute stroke  - cont' ASA, statin  - f/u MRI brain, MRA head and neck  -  neuro check  - f/u TSH, vit b12, folic acid  - f/u neurology consult  - pt/ot    H/O  Seizure  - cont' keppra    H/O HTN  - cont' lisinopril  - monitor BP and HR    Anticipated medical stability for discharge: per medical improvement    Service status/Reason for ongoing hospitalization: suspected acute stroke      [name] Cook    05/11/2016 9:37 PM  Time Elapsed: 70 minutes

## 2016-05-11 NOTE — ED Notes (Signed)
NURSING NOTE FOR THE RECEIVING INPATIENT NURSE   ED NURSE [place] RN   [place] [phone]   ADMISSION INFORMATION   Aaliah Jorgenson is a 41 y.o. female admitted with a diagnosis of:    1. Cerebrovascular accident (CVA), unspecified mechanism        Isolation: None   NURSING CARE   LOC alert and oriented x 4   ADL ADLs:            Independent with all ADLs  Ambulation:  no difficulty   Pertinent Information and/or   Safety Concerns Fr home. Prev hx of stroke and TIA.       VITAL SIGNS     Vitals:    05/11/16 2106   BP: 159/76   Pulse: 60   Resp: 18   Temp: 98.2 F (36.8 C)   SpO2: 98%        IV LINES     IV Catheter Size: 20 g RAC    Peripheral IV 05/11/16 Right Antecubital (Active)   Site Assessment Clean;Dry;Intact 05/11/2016  7:43 PM   Line Status Saline Locked;Flushed 05/11/2016  7:43 PM   Dressing Status Clean;Dry;Intact 05/11/2016  7:43 PM   Number of days: 0          LAB RESULTS     Labs Reviewed   CBC AND DIFFERENTIAL - Abnormal; Notable for the following:        Result Value    Hgb 8.6 (*)     Hematocrit 28.8 (*)     RBC 3.68 (*)     MCV 78.3 (*)     MCH 23.4 (*)     MCHC 29.9 (*)     RDW 17 (*)     MPV 13.4 (*)     All other components within normal limits   COMPREHENSIVE METABOLIC PANEL - Abnormal; Notable for the following:     Globulin 4.0 (*)     All other components within normal limits   URINALYSIS WITH MICROSCOPIC - Abnormal; Notable for the following:     Clarity, UA Sl Cloudy (*)     Leukocyte Esterase, UA Large (*)     Urobilinogen, UA 4.0 (*)     All other components within normal limits   URINE CULTURE    Narrative:     Replace urinary catheter prior to obtaining the urine culture  if it has been in place for greater than or equal to 14  days:->No  Indications for Urine Culture:->N/A Pediatric Patient   PT/INR   APTT   TROPONIN I   HEMOLYSIS INDEX   GFR

## 2016-05-11 NOTE — ED Triage Notes (Signed)
[name] Cook is a 41 y.o. female presented to ED with chief complaint of chest pain which started approx an hour and half ago PTA and right sided facial droop which started at approx 6 hours ago PTA. Now also c/o headache. Denies any numbness or tingling. Took her regularly scheduled Aspirin and Plavix this morning.

## 2016-05-11 NOTE — ED Provider Notes (Signed)
EMERGENCY DEPARTMENT HISTORY AND PHYSICAL EXAM     Physician/Midlevel provider first contact with patient: 05/11/16 1938         Date: 05/11/2016  Patient Name: [name] Cook    History of Presenting Illness     Chief Complaint   Patient presents with   . Facial Droop   . Headache   . Chest Pain       History Provided By: Patient    Chief Complaint: Facial droop  Duration 3pm this afternoon  Timing: Constant  Location: Left sided  Quality: Similar to prior TIA  Severity: Moderate  Exacerbating factors: None  Alleviating factors: None  Associated Symptoms: Right sided headache, right sided numbness, chest pain, nausea, emesis, and right sided weakness    Additional History: [name] Cook is a 41 y.o. female presenting to the ED complaining of a constant left sided facial droop, which she noticed at 3pm this afternoon. Pt states her nephew came home and noticed the facial droop; however, is unsure how long it has actually been present. Pt notes she woke up this morning around 10am without the facial droop. Pt also notes associated right sided numbness, right sided weakness, right sided headache, nausea, and emesis. Pt states the symptoms are similar to when she has had TIAs in the past. Of note, pt took aspirin 325mg  at 10am this morning.       PCP: Pcp, [facility], MD  SPECIALISTS:    Current Facility-Administered Medications   Medication Dose Route Frequency Provider Last Rate Last Dose   . aspirin tablet 325 mg  325 mg Oral Once Arabel Barcenas, Zadie Rhine, MD PhD         Current Outpatient Prescriptions   Medication Sig Dispense Refill   . aspirin 81 MG chewable tablet Chew 1 tablet (81 mg total) by mouth daily. (Patient taking differently: Chew 325 mg by mouth daily.    ) 30 tablet 0   . clopidogrel (PLAVIX) 75 mg tablet Take by mouth.     . LevETIRAcetam (KEPPRA PO) Take 500 mg by mouth 2 (two) times daily.        Marland Kitchen LISINOPRIL PO Take 40 mg by mouth.         Marland Kitchen atorvastatin (LIPITOR) 40 MG tablet Take by mouth.     .  lidocaine (LIDODERM) 5 % Place 1 patch onto the skin daily. Remove & Discard patch within 12 hours or as directed by MD 6 each 0       Past History     Past Medical History:  Past Medical History:   Diagnosis Date   . Hypercholesteremia    . Hypertension    . Seizures    . Stroke     2015 and 2017   . TIA (transient ischemic attack) 2017       Past Surgical History:  Past Surgical History:   Procedure Laterality Date   . APPENDECTOMY         Family History:  Family History   Problem Relation Age of Onset   . Myocardial Infarction Father 75   . Deep vein thrombosis Father        Social History:  Social History   Substance Use Topics   . Smoking status: Never Smoker   . Smokeless tobacco: Never Used   . Alcohol use No       Allergies:  Allergies   Allergen Reactions   . Contrast [Iodinated Diagnostic Agents]    . Iodine    .  Percocet [Oxycodone-Acetaminophen]    . Shellfish-Derived Products    . Toradol [Ketorolac Tromethamine]    . Tramadol        Review of Systems     Review of Systems   Gastrointestinal: Positive for nausea and vomiting.   Neurological: Positive for facial asymmetry, weakness, numbness and headaches.   All other systems reviewed and are negative.    Physical Exam   BP 159/76   Pulse 60   Temp 98.2 F (36.8 C) (Oral)   Resp 18   Ht 5\' 10"  (1.778 m)   Wt 142 kg   LMP 04/23/2016 ([facility] Date)   SpO2 98%   BMI 44.91 kg/m     Nursing note and vitals reviewed.  Constitutional:  Well developed, well nourished. Awake & Oriented x3.  Head:  Atraumatic. Normocephalic.    Eyes:  PERRL. EOMI. Conjunctivae are not pale.  ENT:  Mucous membranes are moist and intact. Oropharynx is clear and symmetric.  Patent airway.  Neck:  Supple. Full ROM.    Cardiovascular:  Regular rate. Regular rhythm. No murmurs, rubs, or gallops.  Respiratory:  No evidence of respiratory distress. Clear to auscultation bilaterally.  No wheezing, rales or rhonchi.   GI:  Soft and non-distended. There is no tenderness. No rebound,  guarding, or rigidity.  Back:  Full ROM. Nontender.  MSK:  No edema. No cyanosis. No clubbing. Full range of motion in all extremities.  Skin:  Skin is warm and dry.  No diaphoresis. No rash.   Neurological:  Alert, awake, and appropriate. Normal speech. Left lower face paralysis, right facial numbness, right arm numbness, right leg numbness, slight arm drift on right, right grip strength slightly weaker than left  Psychiatric:  Good eye contact. Normal interaction, affect, and behavior.    Diagnostic Study Results     Labs -     Results     Procedure Component Value Units Date/Time    Troponin I [[id number]] Collected:  05/11/16 2022    Specimen:  Blood Updated:  05/11/16 2110     Troponin I <0.01 ng/mL     UA with microscopic (pts <  3 yrs) [[id number]]  (Abnormal) Collected:  05/11/16 2022    Specimen:  Urine Updated:  05/11/16 2105     Urine Type Clean Catch     Color, UA Yellow     Clarity, UA Sl Cloudy (A)     Specific Gravity UA 1.020     Urine pH 7.0     Leukocyte Esterase, UA Large (A)     Nitrite, UA Negative     Protein, UR Negative     Glucose, UA Negative     Ketones UA Negative     Urobilinogen, UA 4.0 (A) mg/dL      Bilirubin, UA Negative     Blood, UA Negative     RBC, UA 0 - 2 /hpf      WBC, UA 0 - 5 /hpf      Squamous Epithelial Cells, Urine 11 - 25 /hpf     Comprehensive metabolic panel [[id number]]  (Abnormal) Collected:  05/11/16 2022    Specimen:  Blood Updated:  05/11/16 2102     Glucose 91 mg/dL      BUN 7.0 mg/dL      Creatinine 0.8 mg/dL      Sodium 295 mEq/L      Potassium 3.8 mEq/L      Chloride 107 mEq/L      CO2  26 mEq/L      Calcium 9.1 mg/dL      Protein, Total 7.7 g/dL      Albumin 3.7 g/dL      AST (SGOT) 20 U/L      ALT 19 U/L      Alkaline Phosphatase 81 U/L      Bilirubin, Total 0.6 mg/dL      Globulin 4.0 (H) g/dL      Albumin/Globulin Ratio 0.9     Anion Gap 7.0    Hemolysis index [[id number]] Collected:  05/11/16 2022     Updated:  05/11/16 2102     Hemolysis Index 16    GFR  [[id number]] Collected:  05/11/16 2022     Updated:  05/11/16 2102     EGFR >60.0    APTT [[id number]] Collected:  05/11/16 2022     Updated:  05/11/16 2054     PTT 31 sec     Protime-INR [[id number]] Collected:  05/11/16 2022    Specimen:  Blood Updated:  05/11/16 2053     PT 12.9 sec      PT INR 1.0     PT Anticoag. Given Within 48 hrs. None    CBC with differential [[id number]]  (Abnormal) Collected:  05/11/16 2022    Specimen:  Blood from Blood Updated:  05/11/16 2049     WBC 7.83 x10 3/uL      Hgb 8.6 (L) g/dL      Hematocrit 24.4 (L) %      Platelets 175 x10 3/uL      RBC 3.68 (L) x10 6/uL      MCV 78.3 (L) fL      MCH 23.4 (L) pg      MCHC 29.9 (L) g/dL      RDW 17 (H) %      MPV 13.4 (H) fL      Neutrophils 61.1 %      Lymphocytes Automated 27.3 %      Monocytes 8.9 %      Eosinophils Automated 2.0 %      Basophils Automated 0.6 %      Immature Granulocyte 0.1 %      Nucleated RBC 0.0 /100 WBC      Neutrophils Absolute 4.77 x10 3/uL      Abs Lymph Automated 2.14 x10 3/uL      Abs Mono Automated 0.70 x10 3/uL      Abs Eos Automated 0.16 x10 3/uL      Absolute Baso Automated 0.05 x10 3/uL      Absolute Immature Granulocyte 0.01 x10 3/uL      Absolute NRBC 0.00 x10 3/uL     Urine Culture [[id number]] Collected:  05/11/16 2022    Specimen:  Urine from Urine, Clean Catch Updated:  05/11/16 2022    Narrative:       Replace urinary catheter prior to obtaining the urine culture  if it has been in place for greater than or equal to 14  days:->No  Indications for Urine Culture:->N/A Pediatric Patient          Radiologic Studies -   Radiology Results (24 Hour)     Procedure Component Value Units Date/Time    Chest AP Portable [[id number]] Collected:  05/11/16 2019    Order Status:  Completed Updated:  05/11/16 2023    Narrative:       PORTABLE CHEST    CLINICAL STATEMENT: stroke evaluation. Facial injury.    COMPARISON: No prior  studies are available for comparison.    FINDINGS: The lungs are clear. The cardiomediastinal  silhouette is  unremarkable.          Impression:         No acute cardiopulmonary disease.    Fonnie Mu, MD   05/11/2016 8:19 PM    CT Head without Contrast [[id number]] Collected:  05/11/16 2016    Order Status:  Completed Updated:  05/11/16 2021    Narrative:       CT HEAD    CLINICAL STATEMENT: Facial paralysis. Right upper chest and right lower  shotty weakness.     COMPARISON: 08/18/2015.     TECHNIQUE: Multiple 5 mm thickness axial images were obtained from the  skull base to the vertex without IV contrast administration. Coronal and  sagittal reformatted images were then obtained.    Note that CT scanning at this site utilizes multiple dose reduction  techniques including automatic exposure control, adjustment of the MAS  and/or KVP according to patient size, and use of iterative  reconstruction technique.    FINDINGS: Gray-white matter junction differentiation is appropriate. No  intracranial hemorrhage is identified. The ventricles are appropriate in  size, shape and are midline. There is no intracranial mass, mass-effect  or midline shift. No abnormal intra or extra-axial fluid collections are  identified.     The visualized paranasal sinuses and mastoid air cells are well-aerated.  The osseous calvarium is intact.      Impression:         No CT evidence of an acute intracranial abnormality.    Fonnie Mu, MD   05/11/2016 8:17 PM      .      ED Medication Orders     Start Ordered     Status Ordering Provider    05/11/16 2052 05/11/16 2051  aspirin tablet 325 mg  Once     Route: Oral  Ordered Dose: 325 mg     Ordered Aleen Marston [facility]            Medical Decision Making   I am the first provider for this patient.    I reviewed the vital signs, available nursing notes, past medical history, past surgical history, family history and social history.    Vital Signs-Reviewed the patient's vital signs.     Patient Vitals for the past 12 hrs:   BP Temp Pulse Resp   05/11/16 2106 159/76 98.2 F (36.8 C) 60 18    05/11/16 1935 157/71 98.1 F (36.7 C) (!) 59 18       Pulse Oximetry Analysis - Normal 100% on RA    Cardiac Monitor:  Rate: 61  Rhythm:  Normal Sinus Rhythm     EKG:  Interpreted by the EP.   Time Interpreted: 19:31   Rate: 61   Rhythm: Normal Sinus Rhythm    Interpretation: Normal axis deviation, no LVH, QRS 90, QTC 467, no ST or T wave changes   Comparison: No prior study is available for comparison.    Admit Decision Time:  The decision to admit this patient was made by the emergency provider at 21:03 on 05/11/2016     Old Medical Records: Nursing notes.     ED Course:   8:02 PM - Stroke nurse at bedside, states pt has an NIH stroke scale of 4.     8:05 PM - Discussed pt case with Dr. Halina Maidens, interventional radiology, who recommends dry CT head.  8:56 PM - Discussed pt case with Dr. Becky Sax, neurology, who will consult. Requests MRI/MRA head.     9:03 PM - Discussed pt case with Dr. Gardiner Sleeper, [facility], who agrees to accept pt.    Provider Notes:   Likely acute cva, with unclear time of onset, will admit for neuro evaluation, no a cvir candidate.       Diagnosis     Clinical Impression:   1. Cerebrovascular accident (CVA), unspecified mechanism        Treatment Plan:   ED Disposition     ED Disposition Condition Date/Time Comment    Admit  Thu May 11, 2016  9:04 PM Admitting Physician: Mahlon Gammon, MAJID [phone]   Diagnosis: Cerebrovascular accident (CVA), unspecified mechanism [[id number]]   Estimated Length of Stay: > or = to 2 midnights   Tentative Discharge Plan?: Home or Self Care [1]   Patient Class: Inpatient [101]              _______________________________      Prescriptions:  Patient's Medications   New Prescriptions    No medications on file   Previous Medications    ASPIRIN 81 MG CHEWABLE TABLET    Chew 1 tablet (81 mg total) by mouth daily.    ATORVASTATIN (LIPITOR) 40 MG TABLET    Take by mouth.    CLOPIDOGREL (PLAVIX) 75 MG TABLET    Take by mouth.    LEVETIRACETAM (KEPPRA PO)    Take 500  mg by mouth 2 (two) times daily.       LIDOCAINE (LIDODERM) 5 %    Place 1 patch onto the skin daily. Remove & Discard patch within 12 hours or as directed by MD    LISINOPRIL PO    Take 40 mg by mouth.       Modified Medications    No medications on file   Discontinued Medications    No medications on file               Attestations: This note is prepared by Ree Edman, acting as scribe for Mathis Fare Jaculin Rasmus, MD.    Mathis Fare Shauntee Karp, MD - The scribe's documentation has been prepared under my direction and personally reviewed by me in its entirety.  I confirm that the note above accurately reflects all work, treatment, procedures, and medical decision making performed by me.    _______________________________     Bohden Dung, Zadie Rhine, MD PhD  05/12/16 2053

## 2016-05-12 ENCOUNTER — Inpatient Hospital Stay: Payer: Medicaid HMO

## 2016-05-12 ENCOUNTER — Encounter (INDEPENDENT_AMBULATORY_CARE_PROVIDER_SITE_OTHER): Payer: Self-pay

## 2016-05-12 DIAGNOSIS — R200 Anesthesia of skin: Secondary | ICD-10-CM

## 2016-05-12 DIAGNOSIS — M6281 Muscle weakness (generalized): Secondary | ICD-10-CM

## 2016-05-12 DIAGNOSIS — I639 Cerebral infarction, unspecified: Secondary | ICD-10-CM

## 2016-05-12 LAB — TSH: TSH: 0.86 u[IU]/mL (ref 0.35–4.94)

## 2016-05-12 LAB — CBC
Absolute NRBC: 0 10*3/uL
Hematocrit: 28.4 % — ABNORMAL LOW (ref 37.0–47.0)
Hgb: 8.4 g/dL — ABNORMAL LOW (ref 12.0–16.0)
MCH: 23.5 pg — ABNORMAL LOW (ref 28.0–32.0)
MCHC: 29.6 g/dL — ABNORMAL LOW (ref 32.0–36.0)
MCV: 79.6 fL — ABNORMAL LOW (ref 80.0–100.0)
Nucleated RBC: 0 /100 WBC (ref 0.0–1.0)
Platelets: 168 10*3/uL (ref 140–400)
RBC: 3.57 10*6/uL — ABNORMAL LOW (ref 4.20–5.40)
RDW: 17 % — ABNORMAL HIGH (ref 12–15)
WBC: 8.49 10*3/uL (ref 3.50–10.80)

## 2016-05-12 LAB — ECG 12-LEAD
Atrial Rate: 61 {beats}/min
P Axis: 34 degrees
P-R Interval: 154 ms
Q-T Interval: 464 ms
QRS Duration: 90 ms
QTC Calculation (Bezet): 467 ms
R Axis: 15 degrees
T Axis: 4 degrees
Ventricular Rate: 61 {beats}/min

## 2016-05-12 LAB — LIPID PANEL
Cholesterol / HDL Ratio: 3.7
Cholesterol: 126 mg/dL (ref 0–199)
HDL: 34 mg/dL — ABNORMAL LOW (ref 40–9999)
LDL Calculated: 68 mg/dL (ref 0–99)
Triglycerides: 120 mg/dL (ref 34–149)
VLDL Calculated: 24 mg/dL (ref 10–40)

## 2016-05-12 LAB — BASIC METABOLIC PANEL
Anion Gap: 7 (ref 5.0–15.0)
BUN: 8 mg/dL (ref 7.0–19.0)
CO2: 25 mEq/L (ref 22–29)
Calcium: 8.3 mg/dL — ABNORMAL LOW (ref 8.5–10.5)
Chloride: 108 mEq/L (ref 100–111)
Creatinine: 0.8 mg/dL (ref 0.6–1.0)
Glucose: 101 mg/dL — ABNORMAL HIGH (ref 70–100)
Potassium: 3.2 mEq/L — ABNORMAL LOW (ref 3.5–5.1)
Sodium: 140 mEq/L (ref 136–145)

## 2016-05-12 LAB — VITAMIN B12: Vitamin B-12: 378 pg/mL (ref 211–911)

## 2016-05-12 LAB — HEMOLYSIS INDEX
Hemolysis Index: 4 (ref 0–18)
Hemolysis Index: 17 (ref 0–18)

## 2016-05-12 LAB — GFR: EGFR: >60

## 2016-05-12 LAB — FOLATE: Folate: 6.1 ng/mL

## 2016-05-12 MED ORDER — POTASSIUM CHLORIDE 20 MEQ PO PACK
40.0000 meq | PACK | Freq: Once | ORAL | Status: AC
Start: 2016-05-12 — End: 2016-05-12
  Administered 2016-05-12: 16:00:00 40 meq via ORAL
  Filled 2016-05-12: qty 2
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
## 2016-05-12 NOTE — Progress Notes (Addendum)
Pt from home d/t facial droop. Pt lives with children, indepen, self care, had a cane (not in use).     PCP: No PCP, TCM initiated     DCP: Home with Tcm appt & HH RN/PT/OT . Spouse to transport            05/12/16 1056   Patient Type   Within 30 Days of Previous Admission? No   Healthcare Decisions   Interviewed: Patient   Orientation/Decision Making Abilities of Patient Alert and Oriented x3, able to make decisions   Advance Directive Patient does not have advance directive   Prior to admission   Prior level of function Independent with ADLs;Ambulates independently   Type of Residence Private residence   Home Layout Two level   Have running water, electricity, heat, etc? Yes   Living Arrangements Children   How do you get to your MD appointments? self   How do you get your groceries? self   Who fixes your meals? self   Who does your laundry? self   Who picks up your prescriptions? self   Dressing Independent   Grooming Independent   Feeding Independent   Bathing Independent   Toileting Independent   DME Currently at Home Single point cane   Adult Protective Services (APS) involved? No   Discharge Planning   Support Systems Children;Spouse/significant other   Patient expects to be discharged to: Home   Anticipated [place] plan discussed with: Same as interviewed   Mode of transportation: Private car (family member)   Consults/Providers   PT Evaluation Needed 1   OT Evalulation Needed 1   SLP Evaluation Needed 1   Correct PCP listed in Epic? No (comment)  (No PCP)   Important Message from Medicare Notice   Patient received 1st IMM Letter? n/a   Haywood Pao Via, RN  Case Manager  [phone]

## 2016-05-12 NOTE — Progress Notes (Signed)
[facility]    Received a referral to schedule a follow up appointment with the [facility].  Appointment scheduled for 3/9  at 2:30 pm with Np Jacqulyn Ducking  at [facility]  location.      Clinic address is as follows:    [address]. [place], [address]    Please notify patient to arrive 15 minutes early to the appointment and bring the following materials with them:    Insurance card (if insured) and photo ID  Medications in their original bottles  Glucometer/blood sugar log (if diabetic)  Weight log (if heart failure)  Proof of income (to enroll in medication assistance programs-first two pages of signed 1040 tax forms or last 2 months of pay stubs)      Leandra Kern Reg Rep II  [employer]  T [phone] F [phone]

## 2016-05-12 NOTE — Consults (Signed)
[facility] Consultation Note                                       Date Time: 05/12/16 9:43 AM  Patient Name: [name] Cook  Requesting Physician: Santa Genera, MD  Date of Admission: 05/11/2016    CC / Reason for Consultation: R sided weakness.           Assessment:   41 yo F with PMHx of CVA (2015, 2/2 HTN), multiple TIAs (2017), HLD, HTN presents with acute R sided facial droop and RUE/RLE weakness/numbness. R/O CVA. Takes daily ASA, plavix, and lipitor. Reports anaphylaxis with CT contrast, but no such adverse effect with MRI contrast in past.      -HCT in ED negative.   -MRI/MRA on 08/19/15 unremarkable.        Plan:   -MRI brain wwo  -MRA head wo  -MRA neck wwo  -Labs reviewed   -TSH, Folate, B12 WNL   -LDL 68  -Consider TTE pending MRI/A results.  -Continue ASA, Plavix, Lipitor   -Will consider adjusting/changing pending MRI/A results.   -Will follow    Neurology Attending Addendum:    I have personally reviewed the interval history, images and pertinent test results and I have personally examined the patient and confirmed the major physical findings of the preceding NP/PA note.  Also, I have noted any changes since the note was written as well as added additional findings and recommendations.    Pt admitted with similar event last year.  She does have giveway weakness on the right arm/leg along with subjective sensory loss.  Again her right face appears weaker at rest but she is able to activate symmetrically on smiling but weak eyelid closure at times.  We discussed if the MRI does not show any acute lesions, she can be d/c and f/u with our her outpatient neurologist for further recommendations.  Her MRI does not reveal any old strokes from last year and she is on ASA + Plavix for stroke prevention.    Charlynn Court, MD  [facility]        HPI   [name] Cook is a 41 y.o. female who presents to the hospital with acute onset of R facial droop and R sided  weakness/numbness. PMHx of CVA (2015, 2/2 HTN) without residual sxs ([place] neuro note on 08/19/15 mentions mild left facial decreased sensation and facial asymmetry), multiple TIAs in 2017, HLD, HTN, seizures. R-sided facial droop first noticed around 3pm yesterday, unknown actual onset followed later in the day by right sided weakness and numbness affecting R face, RUE, and RLE. Weakness/numbness prompted ED visit, where HCT was unremarkable. Also c/o of N/V multiple times last night, some CP (normal CXR and EKG) as well as right sided HA x a few days. Does admit to some decreased vision briefly yesterday in both eyes that has since resolved. Took an ASA 325mg  at 10am yesterday morning. Takes 81mg  ASA,  75mg  Plavix, and  40mg  Lipitor daily. Today right sided sxs continue unchanged. PT was not TPA candidate 2/2 past window at presentation.     Past Medical Hx     Past Medical History:   Diagnosis Date   . Hypercholesteremia    . Hypertension    . Seizures    . Stroke     2015 and 2017   . TIA (transient ischemic attack) 2017  Past Surgical Hx:     Past Surgical History:   Procedure Laterality Date   . APPENDECTOMY          Family Medical History:      Family History   Problem Relation Age of Onset   . Myocardial Infarction Father 32   . Deep vein thrombosis Father        Social Hx     Social History     Social History   . Marital status: Single     Spouse name: N/A   . Number of children: N/A   . Years of education: N/A     Occupational History   . Not on file.     Social History Main Topics   . Smoking status: Never Smoker   . Smokeless tobacco: Never Used   . Alcohol use No   . Drug use: No   . Sexual activity: Not on file     Other Topics Concern   . Not on file     Social History Narrative   . No narrative on file       Meds     Home :   Prior to Admission medications    Medication Sig Start Date End Date Taking? Authorizing Provider   aspirin 81 MG chewable tablet Chew 1 tablet (81 mg total) by mouth  daily.  Patient taking differently: Chew 325 mg by mouth daily.     08/20/15  Yes Rane, Megan Mans, MD   atorvastatin (LIPITOR) 40 MG tablet Take by mouth.   Yes [provider]   clopidogrel (PLAVIX) 75 mg tablet Take by mouth.   Yes [provider]   LevETIRAcetam (KEPPRA PO) Take 500 mg by mouth 2 (two) times daily.      Yes [provider]   LISINOPRIL PO Take 40 mg by mouth daily.       Yes [provider]   lidocaine (LIDODERM) 5 % Place 1 patch onto the skin daily. Remove & Discard patch within 12 hours or as directed by MD 06/19/15   Berenice Bouton, MD      Inpatient :   Current Facility-Administered Medications   Medication Dose Route Frequency   . aspirin  81 mg Oral Daily   . atorvastatin  40 mg Oral QHS   . clopidogrel  75 mg Oral Daily   . heparin (porcine)  5,000 Units Subcutaneous Q12H SCH   . levETIRAcetam  500 mg Oral BID   . lisinopril  40 mg Oral Daily         Allergies    Contrast [iodinated diagnostic agents]; Iodine; Percocet [oxycodone-acetaminophen]; Shellfish-derived products; Toradol [ketorolac tromethamine]; and Tramadol      Review of Systems   Pertinent items are noted in HPI.  All other systems were reviewed and are negative except for that mentioned in the HPI    Physical Exam:   Temp:  [96.6 F (35.9 C)-98.2 F (36.8 C)] 96.6 F (35.9 C)  Heart Rate:  [52-69] 58  Resp Rate:  [14-18] 17  BP: (127-159)/(67-82) 140/67     Vital Signs:  Reviewed    General: The patient was well developed and well nourished.  No acute distress. Cooperative with the exam  Extremities: no pedal edema, extremities normal in color    Mental Status: The patient was awake, alert and oriented to person, place, and time.  Affect is normal  Fund of knowledge appropriate  Recent and remote memory  are intact   Attention span and concentration appear normal.  Language function is normal. There is no evidence of aphasia in conversational speech.    Cranial nerves:   -CN II: Visual fields  full to bedside confrontation   -CN III, IV, VI: Pupils equal, round, and reactive to light; extraocular movements intact; Right ptosis              -CN V: Facial sensation diminished to LT/temp on R through V1, V2, & V3 distribution  -CN VII: R sided facial droop  -CN VIII: Hearing intact to conversational speech   -CN IX, X: Palate elevates symmetrically; normal phonation   -CN XI:  strength of  trapezius muscle diminished on R (4/5)  -CN XII: Tongue protrudes with mild deviation to R  Fundus: attempted, unable to visualize optic discs    Motor: Muscle tone normal without spasticity or flaccidity. No atrophy.  Very slight pronator drift on R  Strength  R / L    R / L  Deltoid  4/ 5  Hip Flexion 4 / 5  Triceps  4- / 5   Hip extension 4 / 5  Biceps  4 / 5   Knee flexion 4- / 5  Wrist ext 4 / 5  Knee ext 4- / 5  Wrist flexion 4 / 5  Dorsiflexion 3 / 5  FF   4 / 5  Plantar flexion 4 / 5    Sensory:   Light touch/vibr/temp diminished <50% throughout RUE and RLE.     Reflexes:  R / L     R / L  Biceps  1 / 1  Knees  1 / 1  Triceps 1 / 1  Ankles  1 / 1  BR                   1 / 1  Plantars mute/ mute    Coordination: FTN intact, no truncal ataxia. RAMs intact. No tremors    Gait: deferred 2/2 pt safety.       Labs:     Results     Procedure Component Value Units Date/Time    Lipid panel [[id number]]  (Abnormal) Collected:  05/12/16 0342    Specimen:  Blood Updated:  05/12/16 0918     Cholesterol 126 mg/dL      Triglycerides 952 mg/dL      HDL 34 (L) mg/dL      LDL Calculated 68 mg/dL      VLDL Cholesterol Cal 24 mg/dL      CHOL/HDL Ratio 3.7    Hemolysis index [[id number]] Collected:  05/12/16 0342     Updated:  05/12/16 0918     Hemolysis Index 17    Folate [[id number]] Collected:  05/12/16 0342    Specimen:  Blood Updated:  05/12/16 0827     Folate 6.1 ng/mL     Vitamin B12 [[id number]] Collected:  05/12/16 0342    Specimen:  Blood Updated:  05/12/16 0820     Vitamin B-12 378 pg/mL     TSH [[id number]] Collected:  05/12/16  0342    Specimen:  Blood Updated:  05/12/16 0629     Thyroid Stimulating Hormone 0.86 uIU/mL     Basic Metabolic Panel [[id number]]  (Abnormal) Collected:  05/12/16 0342    Specimen:  Blood Updated:  05/12/16 0614     Glucose 101 (H) mg/dL      BUN 8.0 mg/dL      Creatinine 0.8  mg/dL      Calcium 8.3 (L) mg/dL      Sodium 366 mEq/L      Potassium 3.2 (L) mEq/L      Chloride 108 mEq/L      CO2 25 mEq/L      Anion Gap 7.0    Hemolysis index [[id number]] Collected:  05/12/16 0342     Updated:  05/12/16 0614     Hemolysis Index 4    GFR [[id number]] Collected:  05/12/16 0342     Updated:  05/12/16 0614     EGFR >60.0    CBC without differential [[id number]]  (Abnormal) Collected:  05/12/16 0342    Specimen:  Blood from Blood Updated:  05/12/16 0532     WBC 8.49 x10 3/uL      Hgb 8.4 (L) g/dL      Hematocrit 16.6 (L) %      Platelets 168 x10 3/uL      RBC 3.57 (L) x10 6/uL      MCV 79.6 (L) fL      MCH 23.5 (L) pg      MCHC 29.6 (L) g/dL      RDW 17 (H) %      MPV Unmeasured fL      Nucleated RBC 0.0 /100 WBC      Absolute NRBC 0.00 x10 3/uL     Urine Culture [[id number]] Collected:  05/11/16 2022    Specimen:  Urine from Urine, Clean Catch Updated:  05/12/16 0141    Narrative:       Replace urinary catheter prior to obtaining the urine culture  if it has been in place for greater than or equal to 14  days:->No  Indications for Urine Culture:->N/A Pediatric Patient    Glucose Whole Blood - POCT [[id number]]  (Abnormal) Collected:  05/11/16 2021     Updated:  05/11/16 2133     POCT - Glucose Whole blood 105 (H) mg/dL     Troponin I [[id number]] Collected:  05/11/16 2022    Specimen:  Blood Updated:  05/11/16 2110     Troponin I <0.01 ng/mL     UA with microscopic (pts <  3 yrs) [[id number]]  (Abnormal) Collected:  05/11/16 2022    Specimen:  Urine Updated:  05/11/16 2105     Urine Type Clean Catch     Color, UA Yellow     Clarity, UA Sl Cloudy (A)     Specific Gravity UA 1.020     Urine pH 7.0     Leukocyte Esterase, UA Large (A)      Nitrite, UA Negative     Protein, UR Negative     Glucose, UA Negative     Ketones UA Negative     Urobilinogen, UA 4.0 (A) mg/dL      Bilirubin, UA Negative     Blood, UA Negative     RBC, UA 0 - 2 /hpf      WBC, UA 0 - 5 /hpf      Squamous Epithelial Cells, Urine 11 - 25 /hpf     Comprehensive metabolic panel [[id number]]  (Abnormal) Collected:  05/11/16 2022    Specimen:  Blood Updated:  05/11/16 2102     Glucose 91 mg/dL      BUN 7.0 mg/dL      Creatinine 0.8 mg/dL      Sodium 106 mEq/L      Potassium 3.8 mEq/L      Chloride 107 mEq/L  CO2 26 mEq/L      Calcium 9.1 mg/dL      Protein, Total 7.7 g/dL      Albumin 3.7 g/dL      AST (SGOT) 20 U/L      ALT 19 U/L      Alkaline Phosphatase 81 U/L      Bilirubin, Total 0.6 mg/dL      Globulin 4.0 (H) g/dL      Albumin/Globulin Ratio 0.9     Anion Gap 7.0    Hemolysis index [[id number]] Collected:  05/11/16 2022     Updated:  05/11/16 2102     Hemolysis Index 16    GFR [[id number]] Collected:  05/11/16 2022     Updated:  05/11/16 2102     EGFR >60.0    APTT [[id number]] Collected:  05/11/16 2022     Updated:  05/11/16 2054     PTT 31 sec     Protime-INR [[id number]] Collected:  05/11/16 2022    Specimen:  Blood Updated:  05/11/16 2053     PT 12.9 sec      PT INR 1.0     PT Anticoag. Given Within 48 hrs. None    CBC with differential [[id number]]  (Abnormal) Collected:  05/11/16 2022    Specimen:  Blood from Blood Updated:  05/11/16 2049     WBC 7.83 x10 3/uL      Hgb 8.6 (L) g/dL      Hematocrit 24.4 (L) %      Platelets 175 x10 3/uL      RBC 3.68 (L) x10 6/uL      MCV 78.3 (L) fL      MCH 23.4 (L) pg      MCHC 29.9 (L) g/dL      RDW 17 (H) %      MPV 13.4 (H) fL      Neutrophils 61.1 %      Lymphocytes Automated 27.3 %      Monocytes 8.9 %      Eosinophils Automated 2.0 %      Basophils Automated 0.6 %      Immature Granulocyte 0.1 %      Nucleated RBC 0.0 /100 WBC      Neutrophils Absolute 4.77 x10 3/uL      Abs Lymph Automated 2.14 x10 3/uL      Abs Mono Automated  0.70 x10 3/uL      Abs Eos Automated 0.16 x10 3/uL      Absolute Baso Automated 0.05 x10 3/uL      Absolute Immature Granulocyte 0.01 x10 3/uL      Absolute NRBC 0.00 x10 3/uL           Rads:     Results for orders placed or performed during the hospital encounter of 05/11/16   CT Head without Contrast    Narrative    CT HEAD    CLINICAL STATEMENT: Facial paralysis. Right upper chest and right lower  shotty weakness.     COMPARISON: 08/18/2015.     TECHNIQUE: Multiple 5 mm thickness axial images were obtained from the  skull base to the vertex without IV contrast administration. Coronal and  sagittal reformatted images were then obtained.    Note that CT scanning at this site utilizes multiple dose reduction  techniques including automatic exposure control, adjustment of the MAS  and/or KVP according to patient size, and use of iterative  reconstruction technique.    FINDINGS: Gray-white matter  junction differentiation is appropriate. No  intracranial hemorrhage is identified. The ventricles are appropriate in  size, shape and are midline. There is no intracranial mass, mass-effect  or midline shift. No abnormal intra or extra-axial fluid collections are  identified.     The visualized paranasal sinuses and mastoid air cells are well-aerated.  The osseous calvarium is intact.      Impression    No CT evidence of an acute intracranial abnormality.    Fonnie Mu, MD   05/11/2016 8:17 PM   Results for orders placed or performed during the hospital encounter of 08/18/15   MRI Brain With / Without Contrast    Narrative    MRI BRAIN W WO CONTRAST  CLINICAL HISTORY:Right facial droop    COMPARISON: None available.    TECHNIQUE: Multiplanar multisequence imaging performed. Following 10 cc  Gadavist axial and coronal T1-weighted images were obtained.     INTERPRETATION: Small hyperintense foci noted in the left frontal  subcortical white matter and the centrum semiovale. No other focal  abnormality.  No area of restricted  diffusion identified. No acute  hemorrhage, mass lesion or mass effect. The brainstem, cerebellum and  the craniocervical junction are within normal limits. The contrast  enhancement pattern is normal.      Impression         [No acute process Specifically there is no acute  cortical infarct, mass or evidence of hemorrhage.       Heron Nay, MD   08/19/2015 8:15 AM     MRI Angio Head without Contrast    Narrative    CLINICAL INDICATION: Right facial droop     COMPARISON: None available.     TECHNIQUE:  3-D time-of-flight methodology was utilized to assess the  vessels at the base of the brain and the major branches of the circle of  Willis. Multiplanar reconstructed images were created from the source  data.    INTERPRETATION:  Exam degraded by motion artifact. The distal internal  carotid arteries and the basilar artery are normal in course and  caliber. The proximal portions of the anterior, middle and posterior  cerebral arteries are normal in course and caliber.  No aneurysms or  significant vascular anomalies are noted.      Impression      Technically degraded by motion. No evidence of  high-grade stenosis or large vessel occlusion.    Heron Nay, MD   08/19/2015 8:18 AM         Faith Rogue, PA-C  [facility]  Spectra [phone]

## 2016-05-12 NOTE — Plan of Care (Signed)
Problem: Occupational Therapy  Goal: By discharge, patient will perform self-care at the patient's highest functional potential.  See OT evaluation/note for goals.  Outcome: Progressing  Discharge Recommendation: Home with supervision, Home with home health OT (SUP fxl mob/txs; May benefit from [facility])   DME Recommended for Discharge: Shower chair, Grab bars    OT Frequency Recommended: 1-2x/wk     Is PT evaluation indicated at this time? Yes, a PT evaluation is indicated at this time.    PMP Activity: Step 6 - Walks in Room  Distance Walked (ft) (Step 6,7):  (20 x 20)  (Please See Therapy Evaluation for device and assistance level needed)    Treatment Interventions: Functional transfer training, Patient/Family training, Equipment eval/education, Neuro muscular reeducation, Continued evaluation, UE strengthening/ROM       Goal Formulation: Patient  Time For Goal Achievement: 3 visits     Mobility and Transfer Goals  Pt will perform functional transfers: Modified Independent  Pt will perform bathtub transfer: Supervision  Neuro Re-Ed Goals  Pt will perform dynamic standing balance: Modified Independent, to complete standing ADLs safely, for 5 minutes  Musculoskeletal Goals  Pt will perform Home Exercise Program: independent, to increase engagement in ADLs

## 2016-05-12 NOTE — OT Eval Note (Signed)
[facility]  Occupational Therapy Evaluation and Treatment    Patient: [name] Cook  MRN#: [id number]   Unit: 25 SOU[facility] INTERMED[facility]ATE CARE  Bed: A2503/A2503-B    Time of Evaluation and Treatment:  Time Calculation  OT Received On: 05/12/16  Start Time: 1016  Stop Time: 1038  Time Calculation (min): 22 min    Chart Review and Collaboration with Care Team: 5 minutes, not included in above time.    Evaluation: 10 minutes  Treatment:  12 minutes (1 TA)    OT Visit Number: 1    Consult received for [name] Cook for OT Evaluation and Treatment.  Patient's medical condition is appropriate for Occupational therapy intervention at this time.    Activity Orders: OT eval and treat, progressive mobility protocol    Precautions and Contraindications:  Precautions  Other Precautions: fall risk    Medical Diagnosis:  Cerebrovascular accident (CVA), unspecified mechanism [I63.9]    History of Present Illness:  [name] Cook is a 41 y.o. female admitted on 05/11/2016 with PMHx of HTN,CVA 08/2013,seizure presented to ED for chest pain, headache, Rt sided weakness and numbness, left facial droop. She states about the Rt sided headache for couple of days. She developed chest pain at sternal area with severiy 7/10 associated with  SOB,headache and dizziness,nausea.    Patient Active Problem List   Diagnosis   . Chest pain   . Precordial pain   . Stroke-like symptoms   . Seizures   . Cerebrovascular accident (CVA), unspecified mechanism        Past Medical/Surgical History:  Past Medical History:   Diagnosis Date   . Hypercholesteremia    . Hypertension    . Seizures    . Stroke     2015 and 2017   . TIA (transient ischemic attack) 2017     Past Surgical History:   Procedure Laterality Date   . APPENDECTOMY          X-Rays/Tests/Labs  Lab Results   Component Value Date/Time    HGB 8.4 (L) 05/12/2016 03:42 AM    HCT 28.4 (L) 05/12/2016 03:42 AM    K 3.2 (L) 05/12/2016 03:42 AM    NA 140 05/12/2016 03:42 AM    INR 1.0 05/11/2016  08:22 PM    TROPI <0.01 05/11/2016 08:22 PM    TROPI <0.01 08/19/2015 09:53 PM    TROPI 0.07 08/19/2015 03:39 PM    TROPI <0.01 07/23/2015 10:18 PM       All imaging reviewed. Please see chart for details      Social History:  Prior Level of Function  Prior level of function: (P) Independent with ADLs, Ambulates independently  Baseline Activity Level: Community ambulation  Driving: does not drive  Dressing - Upper Body: independent  Dressing - Lower Body: independent  Feeding: independent  Bathing: independent  Grooming: independent  Toileting: independent  DME Currently at Home: (P) Single point cane    Home Living Arrangements  Living Arrangements: (P) Children  Type of Home: House  Home Layout: (P) Two level  Bathroom Shower/Tub: Electrical engineer:  (none)  DME Currently at Home: (P) Single point cane  Home Living - Notes / Comments: Pt reports someone available for A prn post D/C      Subjective:  Subjective: "I'm really tired."   Patient is agreeable to participation in the therapy session. Nursing clears patient for therapy.   Patient Goal: To return home  Pain  Assessment  Pain Assessment: Numeric Scale (0-10)  Pain Score: 8-severe pain  POSS Score: Awake and Alert  Pain Location: Head  Pain Descriptors: Headache  Pain Frequency: Continuous  Pain Intervention(s): Medication (See eMAR), Rest (RN aware)      Objective:      Observation of Patient/Vital Signs:BP 132/75 HR 62 in supine, BP 151/93 seated EOB post OOB, RN aware  Patient received in bed with telemetry  in place.    Cognitive Status and Neuro Exam:  Cognition/Neuro Status  Arousal/Alertness: Appropriate responses to stimuli  Attention Span: Appears intact  Orientation Level: Oriented to place, Oriented to situation, Oriented to person  Memory: Appears intact  Following Commands: independent  Safety Awareness: independent  Insights: Fully aware of deficits  Behavior: attentive, calm, cooperative  Motor  Planning: intact  Coordination: intact  Hand Dominance: right handed    Musculoskeletal Examination  Gross ROM  Right Upper Extremity ROM: within functional limits  Left Upper Extremity ROM: within functional limits  Gross Strength  Right Upper Extremity Strength: 4/5 (grossly)  Left Upper Extremity Strength: 5/5 (grossly)       Sensory/Oculomotor Examination  Sensory  Auditory: intact  Tactile - Light Touch: impaired right ("feels like a feather touching me")  Visual Acuity: intact         Activities of Daily Living  Self-care and Home Management  Eating: Independent (simulated)  Grooming: Independent, edge of bed (simulated)  Bathing: Modified Independent, edge of bed (simulated)  UB Dressing: Contact Guard Assist, at edge of bed  LB Dressing: Independent, edge of bed (likely req SBA in standing)  Toileting: Stand by Assist (simulated)  Functional Transfers: other (Comment) (Pt declines toilet tx )    Functional Mobility:  Mobility and Transfers  Scooting to EOB: Independent  Supine to Sit: Modified Independent  Sit to Supine: Independent  Sit to Stand: Supervision  Functional Mobility/Ambulation: Risk analyst (w/o AD)     Balance  Balance  Static Sitting Balance: good  Dyanamic Sitting Balance: good  Static Standing Balance: good (w/o AD)  Dynamic Standing Balance: fair (plus w/o AD)    Participation and Activity Tolerance  Participation and Endurance  Participation Effort: good  Endurance: Endurance does not limit participation in activity    Educated the patient to role of occupational therapy, plan of care, goals of therapy and HEP, safety with mobility and ADLs, discharge instructions.    Patient educated on stroke: signs and symptoms.   Patient was able to verbalize understanding. Patient does not need further reinforcement. Patient education handout was given.      Patient left in bed with bed alarm and telemetry in place and call bell and all personal items/needs within reach. RN notified of  session outcome.       Assessment:  [name] Cook is a 41 y.o. female admitted 05/11/2016.   Brief chart review completed including review of labs, review of imaging and review of vitals.  Pt's ability to complete ADLs and functional transfers is impaired due to the following deficits:  decreased balance, gait impairment, pain, sensation deficits, decreased strength and transfers .  Pt demonstrates performance deficits with bathing, dressing, toileting and functional mobility. There are a few comorbidities or other factors that affect plan of care and require modification of task including: assistive device needed for mobility and has stairs to manage.  Pt would continue to benefit from OT to address these deficits and increase functional independence.  Complexity Chart Review Performance  Deficits Clinical Decision  Making Hx/Co-  morbidities Assistance needed   Low Brief 1-3 Limited options None None (or at baseline)     Treatment:  Pt progresses from CGA to SUP/SBA with SPC in ambulating functional household distance approx 20 ft which pt reports is approximate distance at home from bedroom to bathroom. Pt performs x10 marches in place w/ RUE support and SBA, no LOB to simulate stairs in home environment. Edu on HEP to include BUE shoulder flexion and use of right hand for functional activities as able. Encouraged cont OOB as tolerated w/ clin staff A and pt verbalizes understanding. Discussed D/C rec and pt in agreement with plan.    Rehabilitation Potential: Prognosis: Good, With continued OT s/p acute discharge    Plan:  OT Frequency Recommended: 1-2x/wk   Treatment Interventions: Functional transfer training, Patient/Family training, Equipment eval/education, Neuro muscular reeducation, Continued evaluation, UE strengthening/ROM     PMP Activity: Step 6 - Walks in Room  Distance Walked (ft) (Step 6,7):  (20 x 20)    Risks/benefits/POC discussed    G codes: not indicated               AM-PAC:yes  OT  Daily Activity Raw Score: 21  CMS 0-100% Score: 32.79%             Goals:  Time For Goal Achievement: 3 visits     Mobility and Transfer Goals  Pt will perform functional transfers: Modified Independent  Pt will perform bathtub transfer: Supervision  Neuro Re-Ed Goals  Pt will perform dynamic standing balance: Modified Independent, to complete standing ADLs safely, for 5 minutes  Musculoskeletal Goals  Pt will perform Home Exercise Program: independent, to increase engagement in ADLs                       DME Recommended for Discharge: Shower chair, Grab bars  Discharge Recommendation: Home with supervision, Home with home health OT (SUP fxl mob/txs; May benefit from [facility])      Olen Pel, MSOT, OTR/L [phone]  05/12/2016 10:59 AM

## 2016-05-12 NOTE — Plan of Care (Signed)
Problem: Safety  Goal: Patient will be free from injury during hospitalization  Outcome: Progressing   05/11/16 2358   Goal/Interventions addressed this shift   Patient will be free from injury during hospitalization  Assess patient's risk for falls and implement fall prevention plan of care per policy;Provide and maintain safe environment;Use appropriate transfer methods;Ensure appropriate safety devices are available at the bedside;Include patient/ family/ care giver in decisions related to safety;Hourly rounding   Assessed patient as a low fall risk. Fall prevention interventions implemented. Bed low to floor, call bell and table close to patient, patient reminded to use call bell before ambulating if feeling dizzy or weak. Bed alarm on for safety, patient received morphine for pain.  Hourly rounding completed with patient needs assessed at each encounter by care team members.      Problem: Pain  Goal: Pain at adequate level as identified by patient  Outcome: Progressing   05/11/16 2358   Goal/Interventions addressed this shift   Pain at adequate level as identified by patient Identify patient comfort function goal;Assess for risk of opioid induced respiratory depression, including snoring/sleep apnea. Alert healthcare team of risk factors identified.;Assess pain on admission, during daily assessment and/or before any "as needed" intervention(s);Reassess pain within 30-60 minutes of any procedure/intervention, per Pain Assessment, Intervention, Reassessment (AIR) Cycle;Evaluate if patient comfort function goal is met;Evaluate patient's satisfaction with pain management progress   Patient arrived to unit with headache 10/10. Notified Dr Gardiner Sleeper, received and gave order for morphine.    Problem: Day of Admission - Stroke  Goal: Mobility/activity is maintained at optimum level for patient  Outcome: Progressing  Patient right side weaker than left. During NIH assessment patient able to lift right side extremities  without drifting. Noted right side with weaker grip. Patient able to ambulate.  Goal: Vital signs are stable within 8 hours  Outcome: Progressing  VSS. HR bradycardic. On telemetry. Room air.  Goal: Neuro deficits identified  Outcome: Progressing  Noted decrease sensation on right side including face, arm and leg.     Comments: Patient admitted from ED at 2230 with family member at bedside. Patient alert and oriented x4. Patient oriented to call bell and room, fall precautions discussed along with current plan of care and hourly rounding. VSS BP 143/82   Pulse (!) 52   Temp 98.1 F (36.7 C) (Oral)   Resp 16   Ht 1.778 m (5\' 10" )   Wt 139.4 kg (307 lb 6.4 oz)   LMP 04/23/2016 (Exact Date)   SpO2 98%   BMI 44.11 kg/m , c/o headache, morphine given, NIH stroke stroke completed on admission to unit. Stroke information discussed and handouts given.    MRI/MRA ordered for patient, patient refusing contrast due to previous experience with CT contrast and transferring to ICU soon after she received contrast. Discussed with Dr Gardiner Sleeper, patient made aware that MRI contrast is different than CT contrast. Patient stikll refusing. Will notify MRI or next RN before test is done.    Will continue to monitor and maintain patient safety.

## 2016-05-12 NOTE — SLP Eval Note (Signed)
[facility]  Speech and Language Therapy Bedside Swallow Evaluation     Patient:  [name] Cook MRN#:  [id number]  Unit:  25 SOU[facility] INTERMEDIATE CARE Room/Bed:  A2503/A2503-B    Time of Treatment:    0848   0910       Consult received for [name] Cook for SLP Bedside Swallow Evaluation and Treatment.    Medical Diagnosis: Cerebrovascular accident (CVA), unspecified mechanism [I63.9]    History of Present Illness: [name] Cook is a 41 y.o. female admitted on 05/11/2016 complaining of a constant left sided facial droop, which she noticed at 3pm this afternoon. Pt states her nephew came home and noticed the facial droop; however, is unsure how long it has actually been present. Pt notes she woke up this morning around 10am without the facial droop. Pt also notes associated right sided numbness, right sided weakness, right sided headache, nausea, and emesis. Pt states the symptoms are similar to when she has had TIAs in the past. Of note, pt took aspirin 325mg  at 10am this morning.     XR CHEST AP PORTABLE  PORTABLE CHEST    CLINICAL STATEMENT: stroke evaluation. Facial injury.    COMPARISON: No prior studies are available for comparison.    FINDINGS: The lungs are clear. The cardiomediastinal silhouette is  unremarkable.        IMPRESSION:     No acute cardiopulmonary disease.    Fonnie Mu, MD   05/11/2016 8:19 PM    CT HEAD WO CONTRAST    CLINICAL STATEMENT: Facial paralysis. Right upper chest and right lower  shotty weakness.     COMPARISON: 08/18/2015.     TECHNIQUE: Multiple 5 mm thickness axial images were obtained from the  skull base to the vertex without IV contrast administration. Coronal and  sagittal reformatted images were then obtained.    Note that CT scanning at this site utilizes multiple dose reduction  techniques including automatic exposure control, adjustment of the MAS  and/or KVP according to patient size, and use of iterative  reconstruction technique.    FINDINGS:  Gray-white matter junction differentiation is appropriate. No  intracranial hemorrhage is identified. The ventricles are appropriate in  size, shape and are midline. There is no intracranial mass, mass-effect  or midline shift. No abnormal intra or extra-axial fluid collections are  identified.     The visualized paranasal sinuses and mastoid air cells are well-aerated.  The osseous calvarium is intact.    IMPRESSION:     No CT evidence of an acute intracranial abnormality.    Fonnie Mu, MD   05/11/2016 8:17 PM    Patient Active Problem List   Diagnosis   . Chest pain   . Precordial pain   . Stroke-like symptoms   . Seizures   . Cerebrovascular accident (CVA), unspecified mechanism        Past Medical/Surgical History:  Past Medical History:   Diagnosis Date   . Hypercholesteremia    . Hypertension    . Seizures    . Stroke     2015 and 2017   . TIA (transient ischemic attack) 2017      Past Surgical History:   Procedure Laterality Date   . APPENDECTOMY           History/Current Status:  History/Current Status  Respiratory Status: room air  Behavior/Mental Status: Awake/alert, Able to follow directions, Cooperative  Nutrition: oral  Diet Prior to Study: regular, thin liquids  Subjective: Patient is agreeable to participation in the therapy session. Nursing clears patient for therapy. Patient's medical condition is appropriate for Speech therapy intervention at this time.     Objective:  Observation of Patient/Vital Signs:  Patient is seated at edge of bed with no medical equipment in place.    Oral Motor Skills:  Engineer, maintenance (IT) Skills: exceptions to [facility]  Oral Motor Impairments: ROM, coordination, strength, dysarthria    Deglutition Skills:  Deglutition Skills  Position: upright 90 degrees  Food(s) Tested: thin liquid, puree, solid, pills  Oral Stage: chewing reduced, slow but effective  Pharyngeal Stage: adequate  Esophageal Stage: appears within functional limits    Assessment:   Patient  was seen at bedside.  Patient presented with mild to moderate oral pharyngeal dysphagia.  Oral Mechanism revealed lingual weakness on right side, reduced lateralization to corners, right side facial droop, reduced ability to implode air in cheeks.  Oral phase marked by reduced labial closure and prolonged mastication.  Chewing was prolonged but effective in bolus formation and AP transfer.  No oral residue noted after the swallow. Pharyngeal phase marked by appearance of timely swallow trigger with adequate hyo-laryngeal excursion.  Patient was observed to swallow 5 pills at a time with water.  Patient can tolerate a regular diet with thin liquids due to ability to be independent with safe swallow precautions.           Goals:  1.  Patient will demonstrate an adequate oral phase for regular textures with thin liquids with no overt s/sx of aspiration or penetration in a 48 hour period.  2.  Patient will adhere to safe swallow precautions in order to improve safety of swallow in a 48 hour period.      Plan/Recommendations:     Follow up treatments: diet tolerance monitoring, strategies training     Diet Solids Recommendation: Continue with current diet, regular  Diet Liquids Recommendations: thin consistency  Precautions/Compensations: Awake/alert, Upright 90 degrees for all oral intake, Alternate solids and liquids, Small bites/sips  Recommendation Discussed With: : Patient  Administration of Medications: PO whole in liquid  Aspiration Precautions posted at bedside: Discussed with patient    05/12/2016  Karleen Hampshire M.S., CCC-SLP

## 2016-05-12 NOTE — Progress Notes (Signed)
[facility]  PROGRESS NOTE      Patient: [name] Cook  Date: 05/12/2016   LOS: 1 Days  Admission Date: 05/11/2016   MRN: [id number]  Attending: Santa Genera  Please contact me on the following Spectralink [phone]       ASSESSMENT/PLAN     [name] Cook is a 41 y.o. female admitted with Facial droop    Interval Summary:       Patient Active Hospital Problem List:   Facial droop (05/11/2016)    Assessment: acute on chronic; unclear etiology CVA or TIA or complex migraine    Plan: Neuro following  Follow up MRI     Tension type headache     Assessment: possibly complex migraine    Plan: given magnesium and morphine     History of CVA (cerebrovascular accident)     Assessment:     Plan: cont ASA, Plavix and lipitor     Morbid obesity     Assessment:     Plan: weight loss      Analgesia: morphine    Nutrition: cardiac    DVT Prophylaxis:lovenox       Code Status: full code    DISPO: d/c planning in am    Family Contact: niece at bedside       SUBJECTIVE     [name] Cook states she has headache    MEDICATIONS     Current Facility-Administered Medications   Medication Dose Route Frequency   . aspirin  81 mg Oral Daily   . atorvastatin  40 mg Oral QHS   . clopidogrel  75 mg Oral Daily   . heparin (porcine)  5,000 Units Subcutaneous Q12H SCH   . levETIRAcetam  500 mg Oral BID   . lisinopril  40 mg Oral Daily       PHYSICAL EXAM     Vitals:    05/12/16 1938   BP: 118/66   Pulse: 72   Resp: 18   Temp: (!) 96.9 F (36.1 C)   SpO2: 94%       Temperature: Temp  Min: 96.2 F (35.7 C)  Max: 97.3 F (36.3 C)  Pulse: Pulse  Min: 58  Max: 72  Respiratory: Resp  Min: 16  Max: 18  Non-Invasive BP: BP  Min: 118/66  Max: 151/78  Pulse Oximetry SpO2  Min: 94 %  Max: 98 %    Intake and Output Summary (Last 24 hours) at Date Time    Intake/Output Summary (Last 24 hours) at 05/12/16 2231  Last data filed at 05/12/16 1500   Gross per 24 hour   Intake              590 ml   Output                2 ml   Net              588 ml       GEN  APPEARANCE: Normal;  A&OX3, obese  HEENT: PERLA; EOMI; Conjunctiva Clear  NECK: Supple; No bruits  CVS: RRR, S1, S2; No M/G/R  LUNGS: CTAB; No Wheezes; No Rhonchi: No rales  ABD: Soft; No TTP; + Normoactive BS  EXT: No edema; Pulses 2+ and intact  Skin exam:  no pallor  NEURO: right facial asymmetry but no loss of nasolabial fold  CAP REFILL:  Normal  MENTAL STATUS:  Normal    Exam done by Santa Genera, MD on 05/12/16 at  10:31 PM      LABS       Recent Labs  Lab 05/12/16  0342 05/11/16  2022   WBC 8.49 7.83   RBC 3.57* 3.68*   Hgb 8.4* 8.6*   Hematocrit 28.4* 28.8*   MCV 79.6* 78.3*   Platelets 168 175         Recent Labs  Lab 05/12/16  0342 05/11/16  2022   Sodium 140 140   Potassium 3.2* 3.8   Chloride 108 107   CO2 25 26   BUN 8.0 7.0   Creatinine 0.8 0.8   Glucose 101* 91   Calcium 8.3* 9.1         Recent Labs  Lab 05/11/16  2022   ALT 19   AST (SGOT) 20   Bilirubin, Total 0.6   Albumin 3.7   Alkaline Phosphatase 81         Recent Labs  Lab 05/11/16  2022   Troponin I <0.01         Recent Labs  Lab 05/11/16  2022   PT INR 1.0   PT 12.9   PTT 31       Microbiology Results     Procedure Component Value Units Date/Time    Urine Culture [[id number]] Collected:  05/11/16 2022    Specimen:  Urine from Urine, Clean Catch Updated:  05/12/16 0141    Narrative:       Replace urinary catheter prior to obtaining the urine culture  if it has been in place for greater than or equal to 14  days:->No  Indications for Urine Culture:->N/A Pediatric Patient           RADIOLOGY     Upon my review:    Signed,  Santa Genera  10:31 PM 05/12/2016

## 2016-05-12 NOTE — Plan of Care (Signed)
Problem: Speech Language Pathology  Goal: By discharge, patient will demonstrate cognition, communication and safe swallowing at the patient's highest functional potential.  See SLP evaluation(s)/note for goals.  Outcome: Progressing  Goals:  1.  Patient will demonstrate an adequate oral phase for regular textures with thin liquids with no overt s/sx of aspiration or penetration in a 48 hour period.  2.  Patient will adhere to safe swallow precautions in order to improve safety of swallow in a 48 hour period.      Plan/Recommendations:  Follow up treatments: diet tolerance monitoring, strategies training  Diet Solids Recommendation: Continue with current diet, regular  Diet Liquids Recommendations: thin consistency  Precautions/Compensations: Awake/alert, Upright 90 degrees for all oral intake, Alternate solids and liquids, Small bites/sips  Recommendation Discussed With: : Patient  Administration of Medications: PO whole in liquid  Aspiration Precautions posted at bedside: Discussed with patient    05/12/2016  Karleen Hampshire M.S., CCC-SLP

## 2016-05-12 NOTE — Progress Notes (Signed)
Ativan given as ordered. Transported via wheelchair for MRI.

## 2016-05-12 NOTE — UM Notes (Signed)
05/11/16 2104   Admit to Inpatient (Adult Inpatient Admit Panel (AX))     Diagnosis Cerebrovascular accident (CVA), unspecified mechanism     No payor listed    41 year old female arrives to the ED with a headache and chest pain.  She also reports right sided weakness, numbness and left facial droop. Chest pain is 7/10.  It is associated with SOB, headache, dizziness and nausea.     PMH: hypercholesteremia, HTN, seizures CVA 2015 and 2017, TIA.    BP 159/76   Pulse 60   Temp 98.2 F (36.8 C) (Oral)   Resp 18   Ht 1.778 m (5\' 10" )   Wt 142 kg (313 lb)   LMP 04/23/2016 (Exact Date)   SpO2 98%   BMI 44.91 kg/m    Urine Type Clean Catch    Color, UA Yellow    Clarity, UA Sl Cloudy     Specific Gravity UA 1.020    Urine pH 7.0    Leukocyte Esterase, UA Large     Nitrite, UA Negative    Protein, UR Negative    Glucose, UA Negative    Ketones UA Negative    Urobilinogen, UA 4.0     Bilirubin, UA Negative    Blood, UA Negative    RBC, UA 0 - 2    WBC, UA 0 - 5    Squamous Epithelial Cells, Urine 11 - 25      Urine culture pending    WBC 7.83    Hgb 8.6     Hematocrit 28.8     Platelets 175    RBC 3.68     MCV 78.3     MCH 23.4     MCHC 29.9     RDW 17     MPV 13.4       Chest x-ray: No acute cardiopulmonary disease.    CT head:    No CT evidence of an acute intracranial abnormality.    ED medications: aspirin, iv mag sulfate, tylenol    PLAN:  Rt sided weakness.left facial droop  - likely due to acute stroke  - cont' ASA, statin  - f/u MRI brain, MRA head and neck  - neuro check  - f/u TSH, vit b12, folic acid  - f/u neurology consult  - pt/ot    H/O  Seizure  - cont' keppra    H/O HTN  - cont' lisinopril  - monitor BP and HR    Scheduled Meds:  Current Facility-Administered Medications   Medication Dose Route Frequency   . aspirin  81 mg Oral Daily   . atorvastatin  40 mg Oral QHS   . clopidogrel  75 mg Oral Daily   . heparin (porcine)  5,000 Units Subcutaneous Q12H SCH   . levETIRAcetam  500 mg Oral BID     . lisinopril  40 mg Oral Daily     PRN Meds:.acetaminophen **OR** acetaminophen, LORazepam, morphine, naloxone, ondansetron **OR** ondansetron    Elfredia Nevins, RN, BSN  Utilization Review Case Manager  Case Management  [facility]   [address]  [place], [place]  [address]  T [phone]  Judie Petit [phone] F [phone]

## 2016-05-13 DIAGNOSIS — G44209 Tension-type headache, unspecified, not intractable: Secondary | ICD-10-CM | POA: Diagnosis present

## 2016-05-13 DIAGNOSIS — Z8673 Personal history of transient ischemic attack (TIA), and cerebral infarction without residual deficits: Secondary | ICD-10-CM

## 2016-05-13 DIAGNOSIS — E669 Obesity, unspecified: Secondary | ICD-10-CM | POA: Diagnosis present

## 2016-05-13 DIAGNOSIS — Z6841 Body Mass Index (BMI) 40.0 and over, adult: Secondary | ICD-10-CM | POA: Diagnosis present

## 2016-05-13 MED ORDER — FISH OIL 1000 MG PO CAPS
1.0000 | ORAL_CAPSULE | Freq: Two times a day (BID) | ORAL | 0 refills | Status: DC
Start: 2016-05-13 — End: 2017-04-15

## 2016-05-13 NOTE — Progress Notes (Signed)
To this point in the shift; [name] Cook has rested well. She completed her MRI/MRA last night requiring Ativan as ordered prior to the test. She also reported a HA and was medicated as ordered which allowed her to rest well. She is saline locked and the site is wnl. Neuro checks q 4 hours have been doen with no change in the results. She continues to have a right facial droop, right side weakness and decreased sensation noted. She is able to ambulate but with the use of a walker per request. She is able to swallow without difficulty. Education on medications as well as CVA has been provided, no questions at this time. We will continue to monitor her and her progress.

## 2016-05-13 NOTE — Plan of Care (Signed)
Neuro assessment unchanged. Pt is being discharged d/t unremarkable result for the head MRI. Pt is being followed up by her Neurologist. Vital signs stable.

## 2016-05-13 NOTE — Discharge Instr - Diet (Signed)
Cardiac  diet

## 2016-05-13 NOTE — Plan of Care (Signed)
Problem: Every Day - Stroke  Goal: Neurological status is stable or improving  Outcome: Progressing   05/13/16 1041   Goal/Interventions addressed this shift   Neurological status is stable or improving Monitor/assess/document neurological assessment (Stroke: every 4 hours);Monitor/assess NIH Stroke Scale;Re-assess NIH Stroke Scale for any change in status;Observe for seizure activity and initiate seizure precautions if indicated;Perform CAM Assessment   Neuro checks unchanged from yesterday. Pt ambulated to the bathroom without any assistance. Pt was medicated with tylenol and morphine for headache. No other issues/ complaints.   Goal: Stable vital signs and fluid balance  Outcome: Progressing    Goal: Patient will maintain adequate oxygenation  Outcome: Progressing   05/13/16 1041   Goal/Interventions addressed this shift   Patient will maintain adequate oxygenation  Suction secretions as needed;Sleep Apnea: Assess if previously tested or on CPAP     Goal: Mobility/Activity is maintained at optimal level for patient  Outcome: Progressing   05/13/16 1041   Goal/Interventions addressed this shift   Mobility/activity is maintained at optimal level for patient Increase mobility as tolerated/progressive mobility;Maintain proper body alignment;Encourage independent activity per ability;Perform active/passive ROM;Plan activities to conserve energy, plan rest periods;Reposition patient every 2 hours and as needed unless able to reposition self;Assess for changes in respiratory status, level of consciousness and/or development of fatigue

## 2016-05-13 NOTE — Discharge Instr - AVS First Page (Addendum)
[facility] DISCHARGE INSTRUCTIONS     Date of Admission: 05/11/2016    Date of Discharge: 05/13/2016    Discharge Physician: Santa Genera    Dear [name] Cook,     Thank you for choosing [facility] for your emergency care needs. We strive to provide EXCELLENT care to you and your family.     In an effort to explain clearly why you were here in the hospital, I've written a very brief summary. I hope that you find it useful. Other details including formal diagnosis, medication changes, follow up appointment recommendations, and access to MyChart for formal medical records can be found in this packet.       You were admitted for Facial droop for which you were started on fish oil for prevention of future stroke or heart disease. There was no sign of stroke on imaging done during your visit.  Make sure to follow up with our [facility] or [facility] PCP, MD your primary care doctor for follow-up. I cannot stress the importance of follow up enough.    Make sure to bring the following to your doctors appointments:  Medications in their original bottles  Glucometer/blood sugar log (if diabetic)   Weight log (if you have heart failure)    If you are unable to obtain an appointment, unable to obtain newly prescribed medications, or are unclear about any of your discharge instructions please contact me at [phone] (M-F, 8am-3pm) or weekends and after hours via the hospital operator [phone]) [phone], the hospital case manager, or your primary care.

## 2016-05-13 NOTE — Progress Notes (Signed)
Pt left the unit without any incident. Discharge instructions were given, pt offers no question at this time. Pt belongings with her. IV was taken out without any issues. Vital signs were stable. Pt is stable for discharge.

## 2016-05-13 NOTE — Plan of Care (Signed)
Problem: Every Day - Stroke  Goal: Core/Quality measure requirements - Daily  Outcome: Progressing   05/13/16 0457   Goal/Interventions addressed this shift   Core/Quality measure requirements - Daily  VTE Prevention: Ensure anticoagulant(s) administered and/or anti-embolism stockings/devices documented by end of day 2;Ensure antithrombotic administered or contraindication documented by LIP by end of day 2;Once lipid panel has resulted, check LDL. Contact provider for statin order if LDL > 70 (or ensure contraindication documented by LIP).;Continue stroke education (must include Modifiable Risk Factors, Warning Signs and Symptoms of Stroke, Activation of Emergency Medical System and Follow-up Appointments). Ensure handout has been given and documented.       Comments: WE have reviewed the need to monitor neuron status q 4 hours. [name] Cook continues to have weakness and decreased sensation on the right side of her body. She also has right side facial droop. This has not affected her eating or drinking. She is able to ambulate using the walker and was transferred to MRI via wheelchair without difficulty. She is receiving heparin for VTE and has been provided education. She also was given atorvastatin per protocol.  Family was in to see her this evening. She is able to teach back information on when to call EMS. We will continue to provide information as needed.

## 2016-05-13 NOTE — Plan of Care (Signed)
Problem: Every Day - Stroke  Goal: Neurological status is stable or improving  Outcome: Progressing  Pt continuous to complain right sided weakness, although she was able to ambulate independently without any assistance. SR on tele without any ectopy. Will continue to monitor.   Goal: Mobility/Activity is maintained at optimal level for patient  Outcome: Progressing    Goal: Neurovascular status is stable or improving  Outcome: Progressing

## 2016-05-13 NOTE — Discharge Instr - Activity (Signed)
As tolerated

## 2016-05-13 NOTE — Discharge Instructions (Signed)
Stroke Measures:     Healthy Lifestyle to Prevent Another Stroke  Breaking old habits can be hard. But when your health is at stake, it's never too late to make changes for the better. Some lifestyle changes might be easy for you. Others might be tough. So if you need help, talk with your doctor, family,and friends.  Make healthy changes   Diet.Your healthcare provider will give you information on dietary changes that you may need to make, based on your situation. Your provider may recommend that you see a registered dietitian for help with diet changes.   Weight management.If you are overweight, your healthcare provider will work with you to lose weight and lower your BMI (body mass index) to a normal or near-normal level. Making diet changes and increasing physical activity can help.   Stop smoking.If you smoke, the time to quit is now. There's no more time for excuses. Smoking raises blood pressure and damages arteries--both of which can lead to a stroke. To stop smoking, ask your doctor for help. Join a stop-smoking program. Make a list of reasons to quit, including that you'll lower your risk of lung cancer,and read it daily.   Limit alcohol.Drinking too much alcohol can raise blood pressure and increase the risk for stroke. Alcohol can also react with certain medicines. Ask your doctor if it's safe for you to drink alcohol.   Get support.A stroke can leave you feeling frustrated or depressed. Don't ignore your feelings, but don't dwell on them either. Focus on what you can do. Talking to family, friends, your doctor, or clergy can also help.   Reduce stress.Stress can make your heart work harder and raise blood pressure. To reduce stress, try to let go of daily annoyances. Ask yourself if problems will still matter a week from now. Getting proper rest can also help. Finally, don't be embarrassed to ask for help when you need it.   Exercise. Strength and aerobic training improves your  ability to function and do activities of daily living.It also reduces your risk for another stroke. Develop a custom plan with your physical therapist to meet activity goals.  If you have high blood pressure    One of the most important things you can do to prevent another stroke is to keep your blood pressure under control. If you have high blood pressure:   Take all your medicines as directed.   Get regular exercise. Try to work up to getting at least 40 minutes of moderate to high intensity physical activity at least 3 to 4 days each week.   Talk with your doctor about limiting fat and salt in your diet. You most likely will be told to limit your salt intake to 2,400 milligrams (mg) or less each day.   Check your blood pressure regularly. Write down your numbers and bring them to checkups with your doctor.  Manage other health problems  Strokes are often closely related to certain health problems. These include high blood pressure, heart disease, and diabetes. If you have any of these conditions, it's more important than ever to keep them under control. Do this by taking any prescribed medicines and having regular checkups. Keep in mind, too, that the same healthy lifestyle choices that prevent stroke will also help control these health problems.  For family and friends  It's much harder for your loved one to make lifestyle changes if he or she is feeling low. So be on the lookout for sadness, depression, or hopelessness.  These feelings are common after a stroke.Talk with the doctor if you have concerns.   Date Last Reviewed: 07/12/2015   2000-2017 [employer], [place]. [address], [place], [place] [address]. All rights reserved. This information is not intended as a substitute for professional medical care. Always follow your healthcare professional's instructions.      Discharge instructions: Transient Ischemic Attack (TIA)  You have been diagnosed with a TIA, also known as a mini stroke. It is  more accurately characterized as a "warning stroke," a warning you should take seriously.  During a TIA, a blood clot stops blood flow to a part of your brain for a short time.  Symptoms of a TIA happen fast and can last only a few minutes. When a TIA is over, it usually causes no permanent injury to the brain.    Follow-up After Discharge   Most likely there are specific follow up instructions listed in another section of these discharge instructions. If we have not already scheduled a follow-up appointment with your physician (Neurologist or (PCP) Primary Care Physician),  a good rule of thumb is to call your PCP as soon as you are home to make an appointment to be seen within 3 - 5 days of discharge.  If you do not have a PCP, please call [phone] for assistance 24 hours a day, 7 days a week.    Medications Prescribed at Discharge  Take your medications as prescribed, listed on the Medication Reconciliation Discharge Home List    Risk Factors for Stroke   Once you've had a stroke, you're at greater risk for another one. Listed below are some other factors that can increase your risk for another stroke:  . High blood pressure  . High blood cholesterol  . Cigarette or cigar smoking  . Diabetes  . Carotid or other artery disease  . Atrial fibrillation, atrial flutter, or other heart disease  . Physical inactivity  . Obesity  . Certain blood disorders (such as sickle cell anemia)  . Excessive alcohol use  . Abuse of illegal drugs  . Race  . Gender  . Diet high in salty, fried, or greasy foods    Stroke Warning Signs  . Sudden numbness or weakness of the face, arm or leg, especially on one side of the body  . Sudden confusion, trouble speaking or understanding  . Sudden trouble seeing in one or both eyes  . Sudden trouble walking, dizziness, loss of balance or coordination  . Sudden, severe headache with no known cause    If you or someone with you has one or more of these signs, don't delay! Call 9-1-1  immediately.   . Check the time so you'll know when the first symptoms appeared  . Take immediate action  The treatment for stroke must be given within the first three hours for certain patients.    Activation of Emergency Medical System - Call 9-1-1 or get to the hospital fast.    Think - F.A.S.T.     Use the following tool to help you recognize stroke symptoms FAST:    F = Face  Ask the person to smile. Does one side of the face droop?    A = Arms  Ask the person to raise both arms. Does one arm drift downward?    S = Speech Ask the person to repeat a simple phrase.Does the speech sound slurred?    T = Time  If you observe any of  these signs, then it's time to call 9-1-1

## 2016-05-13 NOTE — Stroke Progress Note (Signed)
Assumed pt care at 0730. Received pt in bed alert and oriented x3 complaining of throbbing headache 10/10. Pt verbalized pain was not relieved by tylenol and is requesting for Morphine IV. Pt is somewhat exhibiting signs of drug seeking behavior. Pt continuous to have right sided weakness, right facial droop and decreased sensation on the right side as well. Although speech is clear and coherent although judgement is somewhat poor, affect is blunted.   Pts gait is unsteady, although coordination is intact. Pt has had history of stroke and seizures in the past as well. Fall precaution maintained and placed seizure pads on the side of the bed.   Pt is compliant with her medication regiment and provided education regarding Stroke. Pt provided teachback on stroke and pt verbalized the indications of her medications. Pt was also taught about [employer], showed regarding how to search for her medications, search the net. Pt is seen playing the [employer].   MRI was completed last night and is pending result. Assisted pt to the bathroom and pt was observed walking steadily without the use of assistive devices. Will continue to monitor for health and safety.

## 2016-05-13 NOTE — Final Progress Note (DC Note for stay less than 48 (Signed)
Date:05/13/2016   Patient Name: [name] Cook  Attending Physician: Santa Genera, MD  Today:   BP 132/69   Pulse 72   Temp (!) 96.9 F (36.1 C) (Oral)   Resp 18   Ht 1.778 m (5\' 10" )   Wt 139.4 kg (307 lb 6.4 oz)   LMP 04/23/2016 (Exact Date)   SpO2 96%   BMI 44.11 kg/m   Ranges for the last 24 hours:  Temp:  [96.2 F (35.7 C)-97.6 F (36.4 C)] 96.9 F (36.1 C)  Heart Rate:  [61-72] 72  Resp Rate:  [16-18] 18  BP: (115-151)/(59-81) 132/69    Date of Admission:   05/11/2016    Date of Discharge:   05/13/2016    Outcome of Hospitalization:   Principal Problem:    Facial droop  Active Problems:    Tension type headache    History of CVA (cerebrovascular accident)    Morbid obesity  Resolved Problems:    * No resolved hospital problems. *     Significant Events: Per HPI "[name] is a 41 y.o. female with a PMHx of HTN,CVA 08/2013,seizure presented to Er for chest pain.headache, Rt sided weakness and numbness, left facial droop. She states about the Rt sided headache for couple of days. She developed chest pain at sternal area with severiy 7/10 associated with  SOB,headache and dizziness,nausea   .Also she has noticed left  facial droop  With weakness/numbness Rt upper and lower ext ."  Patient was seen and evaluated by Neurology.  Neuroimaging studies were negative for acute CVA and patient had normal Echo within the last year.  Possibly patient has complex migraine which she states is only relieved by Morphine.  Patient to follow up with primary neurologist.  Per review of [facility] records, patient presented with right sided similar symptoms at the end of January and neuroimaging was negative.  Patient was discharged to SNF for 2 weeks.    Lab Results last 48 Hours     Procedure Component Value Units Date/Time    Urine Culture [[id number]] Collected:  05/11/16 2022    Specimen:  Urine from Urine, Clean Catch Updated:  05/13/16 0904    Narrative:       Replace urinary catheter prior to obtaining the urine  culture  if it has been in place for greater than or equal to 14  days:->No  Indications for Urine Culture:->N/A Pediatric Patient  ORDER#: [id number]                                    ORDERED BY: HE, ALBERT  SOURCE: Urine, Clean Catch                           COLLECTED:  05/11/16 20:22  ANTIBIOTICS AT COLL.:                                RECEIVED :  05/12/16 01:41  Culture Urine                              FINAL       05/13/16 09:04  05/13/16   10,000 - 30,000 CFU/ML of normal urogenital or skin microbiota, no  further work      Lipid panel [[id number]]  (Abnormal) Collected:  05/12/16 0342    Specimen:  Blood Updated:  05/12/16 0918     Cholesterol 126 mg/dL      Triglycerides 409 mg/dL      HDL 34 (L) mg/dL      LDL Calculated 68 mg/dL      VLDL Cholesterol Cal 24 mg/dL      CHOL/HDL Ratio 3.7    Hemolysis index [[id number]] Collected:  05/12/16 0342     Updated:  05/12/16 0918     Hemolysis Index 17    Folate [[id number]] Collected:  05/12/16 0342    Specimen:  Blood Updated:  05/12/16 0827     Folate 6.1 ng/mL     Vitamin B12 [[id number]] Collected:  05/12/16 0342    Specimen:  Blood Updated:  05/12/16 0820     Vitamin B-12 378 pg/mL     TSH [[id number]] Collected:  05/12/16 0342    Specimen:  Blood Updated:  05/12/16 0629     Thyroid Stimulating Hormone 0.86 uIU/mL     Basic Metabolic Panel [[id number]]  (Abnormal) Collected:  05/12/16 0342    Specimen:  Blood Updated:  05/12/16 0614     Glucose 101 (H) mg/dL      BUN 8.0 mg/dL      Creatinine 0.8 mg/dL      Calcium 8.3 (L) mg/dL      Sodium 644 mEq/L      Potassium 3.2 (L) mEq/L      Chloride 108 mEq/L      CO2 25 mEq/L      Anion Gap 7.0    Hemolysis index [[id number]] Collected:  05/12/16 0342     Updated:  05/12/16 0614     Hemolysis Index 4    GFR [[id number]] Collected:  05/12/16 0342     Updated:  05/12/16 0614     EGFR >60.0    CBC without differential [[id number]]  (Abnormal) Collected:  05/12/16 0342    Specimen:  Blood from Blood Updated:   05/12/16 0532     WBC 8.49 x10 3/uL      Hgb 8.4 (L) g/dL      Hematocrit 60.6 (L) %      Platelets 168 x10 3/uL      RBC 3.57 (L) x10 6/uL      MCV 79.6 (L) fL      MCH 23.5 (L) pg      MCHC 29.6 (L) g/dL      RDW 17 (H) %      MPV Unmeasured fL      Nucleated RBC 0.0 /100 WBC      Absolute NRBC 0.00 x10 3/uL     Glucose Whole Blood - POCT [[id number]]  (Abnormal) Collected:  05/11/16 2021     Updated:  05/11/16 2133     POCT - Glucose Whole blood 105 (H) mg/dL     Troponin I [[id number]] Collected:  05/11/16 2022    Specimen:  Blood Updated:  05/11/16 2110     Troponin I <0.01 ng/mL     UA with microscopic (pts <  3 yrs) [[id number]]  (Abnormal) Collected:  05/11/16 2022    Specimen:  Urine Updated:  05/11/16 2105     Urine Type Clean Catch     Color, UA Yellow     Clarity, UA Sl Cloudy (A)     Specific Gravity UA 1.020     Urine pH 7.0  Leukocyte Esterase, UA Large (A)     Nitrite, UA Negative     Protein, UR Negative     Glucose, UA Negative     Ketones UA Negative     Urobilinogen, UA 4.0 (A) mg/dL      Bilirubin, UA Negative     Blood, UA Negative     RBC, UA 0 - 2 /hpf      WBC, UA 0 - 5 /hpf      Squamous Epithelial Cells, Urine 11 - 25 /hpf     Comprehensive metabolic panel [[id number]]  (Abnormal) Collected:  05/11/16 2022    Specimen:  Blood Updated:  05/11/16 2102     Glucose 91 mg/dL      BUN 7.0 mg/dL      Creatinine 0.8 mg/dL      Sodium 413 mEq/L      Potassium 3.8 mEq/L      Chloride 107 mEq/L      CO2 26 mEq/L      Calcium 9.1 mg/dL      Protein, Total 7.7 g/dL      Albumin 3.7 g/dL      AST (SGOT) 20 U/L      ALT 19 U/L      Alkaline Phosphatase 81 U/L      Bilirubin, Total 0.6 mg/dL      Globulin 4.0 (H) g/dL      Albumin/Globulin Ratio 0.9     Anion Gap 7.0    Hemolysis index [[id number]] Collected:  05/11/16 2022     Updated:  05/11/16 2102     Hemolysis Index 16    GFR [[id number]] Collected:  05/11/16 2022     Updated:  05/11/16 2102     EGFR >60.0    APTT [[id number]] Collected:  05/11/16 2022      Updated:  05/11/16 2054     PTT 31 sec     Protime-INR [[id number]] Collected:  05/11/16 2022    Specimen:  Blood Updated:  05/11/16 2053     PT 12.9 sec      PT INR 1.0     PT Anticoag. Given Within 48 hrs. None    CBC with differential [[id number]]  (Abnormal) Collected:  05/11/16 2022    Specimen:  Blood from Blood Updated:  05/11/16 2049     WBC 7.83 x10 3/uL      Hgb 8.6 (L) g/dL      Hematocrit 30.1 (L) %      Platelets 175 x10 3/uL      RBC 3.68 (L) x10 6/uL      MCV 78.3 (L) fL      MCH 23.4 (L) pg      MCHC 29.9 (L) g/dL      RDW 17 (H) %      MPV 13.4 (H) fL      Neutrophils 61.1 %      Lymphocytes Automated 27.3 %      Monocytes 8.9 %      Eosinophils Automated 2.0 %      Basophils Automated 0.6 %      Immature Granulocyte 0.1 %      Nucleated RBC 0.0 /100 WBC      Neutrophils Absolute 4.77 x10 3/uL      Abs Lymph Automated 2.14 x10 3/uL      Abs Mono Automated 0.70 x10 3/uL      Abs Eos Automated 0.16 x10 3/uL      Absolute Baso  Automated 0.05 x10 3/uL      Absolute Immature Granulocyte 0.01 x10 3/uL      Absolute NRBC 0.00 x10 3/uL           Procedures performed:   Radiology: all results from this admission  Ct Head Without Contrast    Result Date: 05/11/2016  No CT evidence of an acute intracranial abnormality. Fonnie Mu, MD 05/11/2016 8:17 PM    Mr Angiogram Head Wo Contrast    Result Date: 05/13/2016   Unremarkable MRA of the brain. Sandie Ano, MD 05/13/2016 9:10 AM    Mra Neck Wo Contrast    Result Date: 05/13/2016   Unremarkable noncontrasted MRA of the neck within study limitations as described above. Sandie Ano, MD 05/13/2016 9:26 AM    Mri Brain Wo Contrast    Result Date: 05/13/2016   1. Limited study secondary to motion artifact. 2. No MR evidence for acute infarct. Sandie Ano, MD 05/13/2016 9:18 AM    Chest Ap Portable    Result Date: 05/11/2016  No acute cardiopulmonary disease. Fonnie Mu, MD 05/11/2016 8:19 PM      Treatment Team:   Attending Provider: Santa Genera, MD  Consulting  Physician: Mahlon Gammon, Belva Crome, MD    Disposition:   Disposition: Home or Self Care    Condition at Discharge:   good     Follow up Recommendations for Receiving Provider     1. Follow up with Neurology  Unresulted Labs     None          Discharge Instructions:     Follow-up Information     Hazle Coca, MD. Schedule an appointment as soon as possible for a visit in 2 week(s).    Specialties:  Neurology, Clinical Neurophysiology  Why:  This is a Systems developer and can only be seen by those with [employer].  If you do not have [employer] please follow with Dr. Genia Plants, Javier Glazier, MD. Schedule an appointment as soon as possible for a visit in 2 week(s).    Specialty:  Neurology  Contact information:  [address]  [place] [address]  [phone]                      Discharge Medication List      Taking    aspirin 81 MG chewable tablet  Dose:  81 mg  What changed:  how much to take  Chew 1 tablet (81 mg total) by mouth daily.     atorvastatin 40 MG tablet  Commonly known as:  LIPITOR  Take by mouth.     clopidogrel 75 mg tablet  Commonly known as:  PLAVIX  Take by mouth.     fish oil 1000 MG Caps capsule  Dose:  1 capsule  For:  Cerebrovascular Accident or Stroke  Take 1 capsule (1,000 mg total) by mouth 2 (two) times daily.     KEPPRA PO  Dose:  500 mg  Take 500 mg by mouth 2 (two) times daily.     lidocaine 5 %  Dose:  1 patch  Commonly known as:  LIDODERM  Place 1 patch onto the skin daily. Remove & Discard patch within 12 hours or as directed by MD     LISINOPRIL PO  Dose:  40 mg  Take 40 mg by mouth daily.  Signed by: Orma Flaming, MD

## 2016-05-19 ENCOUNTER — Ambulatory Visit (INDEPENDENT_AMBULATORY_CARE_PROVIDER_SITE_OTHER): Payer: Self-pay | Admitting: Nurse Practitioner

## 2016-09-20 ENCOUNTER — Emergency Department
Admission: EM | Admit: 2016-09-20 | Discharge: 2016-09-21 | Disposition: A | Discharge status: Home / Self Care | Payer: Medicaid HMO | Attending: Emergency Medicine | Admitting: Emergency Medicine

## 2016-09-20 DIAGNOSIS — Z8673 Personal history of transient ischemic attack (TIA), and cerebral infarction without residual deficits: Secondary | ICD-10-CM | POA: Insufficient documentation

## 2016-09-20 DIAGNOSIS — R0789 Other chest pain: Secondary | ICD-10-CM | POA: Insufficient documentation

## 2016-09-20 DIAGNOSIS — E7800 Pure hypercholesterolemia, unspecified: Secondary | ICD-10-CM | POA: Insufficient documentation

## 2016-09-20 DIAGNOSIS — Z885 Allergy status to narcotic agent status: Secondary | ICD-10-CM | POA: Insufficient documentation

## 2016-09-20 DIAGNOSIS — I10 Essential (primary) hypertension: Secondary | ICD-10-CM | POA: Insufficient documentation

## 2016-09-20 DIAGNOSIS — Z7982 Long term (current) use of aspirin: Secondary | ICD-10-CM | POA: Insufficient documentation

## 2016-09-20 NOTE — ED Triage Notes (Signed)
PT complains of a pressure like pain down the middle of the chest that started 20 minutes ago while sitting watching TV.  Denies sob.

## 2016-09-21 ENCOUNTER — Emergency Department: Payer: Medicaid HMO

## 2016-09-21 LAB — CBC AND DIFFERENTIAL
Absolute NRBC: 0 10*3/uL
Basophils Absolute Automated: 0.04 10*3/uL (ref 0.00–0.20)
Basophils Automated: 0.4 %
Eosinophils Absolute Automated: 0.15 10*3/uL (ref 0.00–0.70)
Eosinophils Automated: 1.4 %
Hematocrit: 33.7 % — ABNORMAL LOW (ref 37.0–47.0)
Hgb: 9.9 g/dL — ABNORMAL LOW (ref 12.0–16.0)
Immature Granulocytes Absolute: 0.03 10*3/uL
Immature Granulocytes: 0.3 %
Lymphocytes Absolute Automated: 3.12 10*3/uL (ref 0.50–4.40)
Lymphocytes Automated: 28.8 %
MCH: 22.7 pg — ABNORMAL LOW (ref 28.0–32.0)
MCHC: 29.4 g/dL — ABNORMAL LOW (ref 32.0–36.0)
MCV: 77.1 fL — ABNORMAL LOW (ref 80.0–100.0)
Monocytes Absolute Automated: 0.88 10*3/uL (ref 0.00–1.20)
Monocytes: 8.1 %
Neutrophils Absolute: 6.61 10*3/uL (ref 1.80–8.10)
Neutrophils: 61 %
Nucleated RBC: 0 /100 WBC (ref 0.0–1.0)
Platelets: 193 10*3/uL (ref 140–400)
RBC: 4.37 10*6/uL (ref 4.20–5.40)
RDW: 18 % — ABNORMAL HIGH (ref 12–15)
WBC: 10.83 10*3/uL — ABNORMAL HIGH (ref 3.50–10.80)

## 2016-09-21 LAB — COMPREHENSIVE METABOLIC PANEL
ALT: 19 U/L (ref 0–55)
AST (SGOT): 19 U/L (ref 5–34)
Albumin/Globulin Ratio: 0.9 (ref 0.9–2.2)
Albumin: 4 g/dL (ref 3.5–5.0)
Alkaline Phosphatase: 80 U/L (ref 37–106)
Anion Gap: 9 (ref 5.0–15.0)
BUN: 12 mg/dL (ref 7.0–19.0)
Bilirubin, Total: 0.4 mg/dL (ref 0.2–1.2)
CO2: 23 mEq/L (ref 22–29)
Calcium: 9.2 mg/dL (ref 8.5–10.5)
Chloride: 106 mEq/L (ref 100–111)
Creatinine: 0.9 mg/dL (ref 0.6–1.0)
Globulin: 4.3 g/dL — ABNORMAL HIGH (ref 2.0–3.6)
Glucose: 81 mg/dL (ref 70–100)
Potassium: 3.9 mEq/L (ref 3.5–5.1)
Protein, Total: 8.3 g/dL (ref 6.0–8.3)
Sodium: 138 mEq/L (ref 136–145)

## 2016-09-21 LAB — URINALYSIS POC
Blood, UA POCT: NEGATIVE
POCT Urine Bilirubin: NEGATIVE
POCT Urine Glucose: NEGATIVE mg/dL
POCT Urine Ketones: NEGATIVE mg/dL
POCT Urine Nitrites: NEGATIVE
POCT Urine Urobilibogen: 1 mg/dL (ref 0.0–1.0)
POCT Urine pH: 6.5 (ref 5.0–8.0)
Protein, UR POCT: 30 mg/dL — AB
Urine Specific Gravity POC: 1.025 (ref 1.001–1.035)

## 2016-09-21 LAB — LIPASE: Lipase: 34 U/L (ref 8–78)

## 2016-09-21 LAB — IHS D-DIMER: D-Dimer: 0.93 ug/mL FEU — ABNORMAL HIGH (ref 0.00–0.50)

## 2016-09-21 LAB — ECG 12-LEAD
Atrial Rate: 79 {beats}/min
P Axis: 47 degrees
P-R Interval: 148 ms
Q-T Interval: 406 ms
QRS Duration: 84 ms
QTC Calculation (Bezet): 465 ms
R Axis: 43 degrees
T Axis: 32 degrees
Ventricular Rate: 79 {beats}/min

## 2016-09-21 LAB — TROPONIN I
Troponin I: <0.01 ng/mL (ref 0.00–0.09)
Troponin I: <0.01 ng/mL (ref 0.00–0.09)

## 2016-09-21 LAB — URINE BHCG POC: Urine bHCG POC: NEGATIVE

## 2016-09-21 LAB — HEMOLYSIS INDEX: Hemolysis Index: 16 (ref 0–18)

## 2016-09-21 LAB — GFR: EGFR: >60

## 2016-09-21 MED ORDER — ONDANSETRON 4 MG PO TBDP
4.0000 mg | ORAL_TABLET | Freq: Once | ORAL | Status: AC
Start: 2016-09-21 — End: 2016-09-21
  Administered 2016-09-21: 10:00:00 4 mg via SUBLINGUAL
  Filled 2016-09-21: qty 1

## 2016-09-21 MED ORDER — ONDANSETRON HCL 4 MG/2ML IJ SOLN
4.0000 mg | Freq: Once | INTRAMUSCULAR | Status: AC
Start: 2016-09-21 — End: 2016-09-21
  Administered 2016-09-21: 01:00:00 4 mg via INTRAVENOUS
  Filled 2016-09-21: qty 2

## 2016-09-21 MED ORDER — ALUM & MAG HYDROXIDE-SIMETH 200-200-20 MG/5ML PO SUSP
30.0000 mL | Freq: Once | ORAL | Status: AC
Start: 2016-09-21 — End: 2016-09-21
  Administered 2016-09-21: 02:00:00 30 mL via ORAL
  Filled 2016-09-21: qty 30

## 2016-09-21 MED ORDER — ONDANSETRON HCL 4 MG/2ML IJ SOLN
4.0000 mg | Freq: Once | INTRAMUSCULAR | Status: AC
Start: 2016-09-21 — End: 2016-09-21
  Administered 2016-09-21: 07:00:00 4 mg via INTRAVENOUS
  Filled 2016-09-21: qty 2

## 2016-09-21 MED ORDER — SODIUM CHLORIDE 0.9 % IV BOLUS
1000.0000 mL | Freq: Once | INTRAVENOUS | Status: AC
Start: 2016-09-21 — End: 2016-09-21
  Administered 2016-09-21: 01:00:00 1000 mL via INTRAVENOUS

## 2016-09-21 MED ORDER — FAMOTIDINE 10 MG/ML IV SOLN (WRAP)
20.0000 mg | Freq: Once | INTRAVENOUS | Status: AC
Start: 2016-09-21 — End: 2016-09-21
  Administered 2016-09-21: 01:00:00 20 mg via INTRAVENOUS
  Filled 2016-09-21: qty 2

## 2016-09-21 MED ORDER — MORPHINE SULFATE 10 MG/ML IJ/IV SOLN (WRAP)
8.0000 mg | Freq: Once | Status: AC
Start: 2016-09-21 — End: 2016-09-21
  Administered 2016-09-21: 8 mg via INTRAVENOUS
  Filled 2016-09-21: qty 1

## 2016-09-21 MED ORDER — DICLOFENAC SODIUM 50 MG PO TBEC
50.0000 mg | DELAYED_RELEASE_TABLET | Freq: Two times a day (BID) | ORAL | 0 refills | Status: DC
Start: 2016-09-21 — End: 2017-04-15

## 2016-09-21 MED ORDER — MORPHINE SULFATE 10 MG/ML IJ/IV SOLN (WRAP)
10.0000 mg | Freq: Once | Status: AC
Start: 2016-09-21 — End: 2016-09-21
  Administered 2016-09-21: 10 mg via INTRAVENOUS
  Filled 2016-09-21: qty 1

## 2016-09-21 MED ORDER — ONDANSETRON 4 MG PO TBDP
4.0000 mg | ORAL_TABLET | Freq: Four times a day (QID) | ORAL | 0 refills | Status: DC | PRN
Start: 2016-09-21 — End: 2017-04-15

## 2016-09-21 MED ORDER — LORAZEPAM 2 MG/ML IJ SOLN
1.0000 mg | Freq: Once | INTRAMUSCULAR | Status: AC
Start: 2016-09-21 — End: 2016-09-21
  Administered 2016-09-21: 04:00:00 1 mg via INTRAVENOUS
  Filled 2016-09-21: qty 1

## 2016-09-21 MED ORDER — LIDOCAINE VISCOUS 2 % MT SOLN
10.0000 mL | Freq: Once | OROMUCOSAL | Status: AC
Start: 2016-09-21 — End: 2016-09-21
  Administered 2016-09-21: 02:00:00 10 mL via OROMUCOSAL
  Filled 2016-09-21: qty 15

## 2016-09-21 MED ORDER — PROMETHAZINE HCL 25 MG RE SUPP
12.5000 mg | Freq: Four times a day (QID) | RECTAL | Status: DC | PRN
Start: 2016-09-21 — End: 2016-09-21
  Filled 2016-09-21: qty 1

## 2016-09-21 NOTE — Progress Notes (Signed)
CM spoke with pt to assist with modes of transportation. Pt stated that her car is in the parking lot and she does not understand why she was given a high dose of medication.    Pt was explained that she is not allow to drive and if she does, police will be called. Friend at bs who does not drive. Pt stated, she will call significant other and if he is not able to pick her up at this moment, pt will wait for significant other at waiting area. Pt stated understating of all explained to her.     Haywood Pao Via, RN  Case Manager  [phone]

## 2016-09-21 NOTE — ED Provider Notes (Signed)
SIGN-OUT RECEIVED 6:50 AM   I assumed care of this patient at time of sign-out from Dr. Diona Foley.      8:25 AM - Pt awake and resting comfortably.    8:37 AM -  Pt unable to ambulate without aid.  Too dizzy to stand steadily.  Requests more time to improve.  Tolerating PO well.    8:53 AM -  Pt able to stand.     9:43 AM -  Pt able to stand, still dizzy.    9:48 AM -  Pt wishes to drive home but is being offered a Zenaida Niece to [employer]    9:54 AM -  The pt is medically cleared but may not drive.  She will be referred to the charge nurse and [employer] in case she insists upon driving.    10:10 AM - Pt vomiting, will give Zofran.    11:23 AM -  Pt is now planning to [place] home.  Requests only IV medications and declines all treatments. Still dizzy but able to ambulate without aid.      Results were reviewed.     I informed the patient of findings and treatment plan was discussed.      Chart reconciliation: Dr.Sabatino was the primary emergency physician of record.        _______________________________      Attestations: This note is prepared by Manus Gunning, acting as scribe for Lavonda Jumbo, MD FACEP. The scribe's documentation has been prepared under my direction and personally reviewed by me in its entirety.  I confirm that the note above accurately reflects all work, treatment, procedures, and medical decision making performed by me.    _______________________________         Donny Pique, MD  09/21/16 [phone]

## 2016-09-21 NOTE — ED Notes (Signed)
Pt states that she wants to take an [place] home. She understands that she is NOT allowed to drive.

## 2016-09-21 NOTE — ED Notes (Signed)
Case Manager Fiorella, spoke with patient regarding transportation arrangement, patient is to call husband to come pick her up in the hospital.

## 2016-09-21 NOTE — ED Provider Notes (Signed)
EMERGENCY DEPARTMENT HISTORY AND PHYSICAL EXAM     Physician/Midlevel provider first contact with patient: 09/20/16 2328         Date: 09/20/2016  Patient Name: [name] Cook  Attending Physician:  Ames Dura, DO, FACOEP      History of Presenting Illness     Chief Complaint   Patient presents with   . Chest Pain       History Provided By: Pt  Chief Complaint: CP  Onset: 10:00 PM  Timing: Constant  Location: Central  Quality: Pressure  Severity: 8/10  Modifying Factors: None reported  Associated sxs: Dizziness (s/p CP), vomiting (s/p CP), and R-sided abdominal pain.    Additional History: [name] Cook is a 41 y.o. female w/ a h/o HTN, TIA, seizures, and appendectomy p/w constant pressure central CP starting at 10:00 PM w/ associated dizziness (s/p CP), vomiting (s/p CP), and R-sided abdominal pain. The pt states she was sitting at home when the CP started. She says she had this pain a couple years ago, saw a cardiologist for it, and had an implantable loop monitor but it had to be removed due to a skin complication. Denies recent illness or recent travel. LNMP 6/21. Denies smoking or drinking alcohol. Denies leg swelling, constipation, diarrhea, or urinary symptoms.     PCP: Pcp, [facility], MD      No current facility-administered medications for this encounter.      Current Outpatient Prescriptions   Medication Sig Dispense Refill   . acetaminophen (TYLENOL) 500 MG tablet Take by mouth.     Marland Kitchen albuterol (VENTOLIN HFA) 108 (90 Base) MCG/ACT inhaler Inhale into the lungs.     Marland Kitchen aspirin EC 81 MG EC tablet Take by mouth.     . levETIRAcetam (KEPPRA) 500 MG tablet Take by mouth.     . nitroglycerin (NITROSTAT) 0.4 MG SL tablet Place under the tongue.     Marland Kitchen aspirin 81 MG chewable tablet Chew 1 tablet (81 mg total) by mouth daily. (Patient taking differently: Chew 325 mg by mouth daily.    ) 30 tablet 0   . atorvastatin (LIPITOR) 40 MG tablet Take by mouth.     . clopidogrel (PLAVIX) 75 mg tablet Take by mouth.      . diclofenac (VOLTAREN) 50 MG EC tablet Take 1 tablet (50 mg total) by mouth 2 (two) times daily. 20 tablet 0   . LevETIRAcetam (KEPPRA PO) Take 500 mg by mouth 2 (two) times daily.        Marland Kitchen lidocaine (LIDODERM) 5 % Place 1 patch onto the skin daily. Remove & Discard patch within 12 hours or as directed by MD 6 each 0   . lisinopril (PRINIVIL,ZESTRIL) 5 MG tablet Take by mouth.     Marland Kitchen LISINOPRIL PO Take 40 mg by mouth daily.         . Omega-3 Fatty Acids (FISH OIL) 1000 MG Cap capsule Take 1 capsule (1,000 mg total) by mouth 2 (two) times daily.  0   . ondansetron (ZOFRAN ODT) 4 MG disintegrating tablet Take 1 tablet (4 mg total) by mouth every 6 (six) hours as needed for Nausea. 20 tablet 0   . pantoprazole (PROTONIX) 40 MG tablet Take by mouth.         Past Medical History   Past Medical History:  Past Medical History:   Diagnosis Date   . Hypercholesteremia    . Hypertension    . Seizures    . Stroke  2015 and 2017   . TIA (transient ischemic attack) 2017       Past Surgical History:  Past Surgical History:   Procedure Laterality Date   . APPENDECTOMY         Family History:  Family History   Problem Relation Age of Onset   . Myocardial Infarction Father 15   . Deep vein thrombosis Father        Social History:  Social History   Substance Use Topics   . Smoking status: Never Smoker   . Smokeless tobacco: Never Used   . Alcohol use No       Allergies:  Allergies   Allergen Reactions   . Contrast [Iodinated Diagnostic Agents] Anaphylaxis     Patient woke up in ICU after having CT with contrast, last remembers being in CT   . Fioricet [Butalbital-Apap-Caffeine] Hives   . Iodine    . Percocet [Oxycodone-Acetaminophen]    . Shellfish-Derived Products    . Toradol [Ketorolac Tromethamine]    . Tramadol        Review of Systems     Review of Systems   Cardiovascular: Positive for chest pain. Negative for leg swelling.   Gastrointestinal: Positive for abdominal pain and vomiting. Negative for constipation and  diarrhea.   Genitourinary: Negative for dysuria, frequency, hematuria and urgency.   Neurological: Positive for dizziness.   All other systems reviewed and are negative.      Physical Exam     BP 137/76   Pulse 83   Temp 97.8 F (36.6 C) (Oral)   Resp 17   Ht 5\' 10"  (1.778 m)   Wt 137.4 kg   LMP 08/31/2016   SpO2 98%   BMI 43.48 kg/m   Pulse Oximetry Analysis - Normal 97% on RA    Physical Exam   Constitutional: She appears well-developed and well-nourished. She appears distressed (moderate discomfort).   HENT:   Head: Normocephalic and atraumatic.   Eyes: Conjunctivae are normal. Right eye exhibits no discharge. Left eye exhibits no discharge. No scleral icterus.   Neck: Normal range of motion. Neck supple.   Cardiovascular: Normal rate and regular rhythm.    Pulmonary/Chest: Effort normal and breath sounds normal. No stridor. No respiratory distress.   Abdominal: Soft. She exhibits no distension. There is tenderness (RUQ and epigastric tenderness). There is no rebound.   Musculoskeletal: Normal range of motion. She exhibits no edema or tenderness.   Neurological: She is alert. She exhibits normal muscle tone.   Skin: Skin is warm and dry. She is not diaphoretic.   Psychiatric: She has a normal mood and affect. Her behavior is normal.   Vitals reviewed.        Diagnostic Study Results     Labs -     Results     Procedure Component Value Units Date/Time    D-Dimer [[id number]]  (Abnormal) Collected:  09/21/16 0027     Updated:  09/21/16 0600     D-Dimer 0.93 (H) ug/mL FEU     Troponin I [[id number]] Collected:  09/21/16 0329    Specimen:  Blood Updated:  09/21/16 0409     Troponin I <0.01 ng/mL     Troponin I [[id number]] Collected:  09/21/16 0027    Specimen:  Blood Updated:  09/21/16 0056     Troponin I <0.01 ng/mL     Urine BHCG POC [[id number]] Collected:  09/21/16 0044     Updated:  09/21/16 3016  Urine bHCG POC Negative    Comprehensive metabolic panel [[id number]]  (Abnormal) Collected:  09/21/16 0027     Specimen:  Blood Updated:  09/21/16 0050     Glucose 81 mg/dL      BUN 91.4 mg/dL      Creatinine 0.9 mg/dL      Sodium 782 mEq/L      Potassium 3.9 mEq/L      Chloride 106 mEq/L      CO2 23 mEq/L      Calcium 9.2 mg/dL      Protein, Total 8.3 g/dL      Albumin 4.0 g/dL      AST (SGOT) 19 U/L      ALT 19 U/L      Alkaline Phosphatase 80 U/L      Bilirubin, Total 0.4 mg/dL      Globulin 4.3 (H) g/dL      Albumin/Globulin Ratio 0.9     Anion Gap 9.0    Lipase [[id number]] Collected:  09/21/16 0027    Specimen:  Blood Updated:  09/21/16 0050     Lipase 34 U/L     Hemolysis index [[id number]] Collected:  09/21/16 0027     Updated:  09/21/16 0050     Hemolysis Index 16    GFR [[id number]] Collected:  09/21/16 0027     Updated:  09/21/16 0050     EGFR >60.0    Urinalysis POC [[id number]]  (Abnormal) Collected:  09/21/16 0042     Updated:  09/21/16 0044     POCT Urine Color Yellow     POCT Urine Clarity Clear     POCT Urine pH 6.5     Urine leukocyte Esterase, POCT Large (A)     POCT Urine Nitrites Negative     Protein, UR POCT 30 (A) mg/dL      POCT Urine Glucose Negative mg/dL      POCT Urine Ketones Negative mg/dL      POCT Urine Urobilibogen 1.0 mg/dL      POCT Urine Bilirubin Negative     Blood, UA POCT Negative     Urine Specific Gravity POC 1.025    CBC with differential [[id number]]  (Abnormal) Collected:  09/21/16 0027    Specimen:  Blood from Blood Updated:  09/21/16 0038     WBC 10.83 (H) x10 3/uL      Hgb 9.9 (L) g/dL      Hematocrit 59.5 (L) %      Platelets 193 x10 3/uL      RBC 4.37 x10 6/uL      MCV 77.1 (L) fL      MCH 22.7 (L) pg      MCHC 29.4 (L) g/dL      RDW 18 (H) %      MPV Unmeasured fL      Neutrophils 61.0 %      Lymphocytes Automated 28.8 %      Monocytes 8.1 %      Eosinophils Automated 1.4 %      Basophils Automated 0.4 %      Immature Granulocyte 0.3 %      Nucleated RBC 0.0 /100 WBC      Neutrophils Absolute 6.61 x10 3/uL      Abs Lymph Automated 3.12 x10 3/uL      Abs Mono Automated 0.88 x10  3/uL      Abs Eos Automated 0.15 x10 3/uL      Absolute Baso Automated 0.04 x10  3/uL      Absolute Immature Granulocyte 0.03 x10 3/uL      Absolute NRBC 0.00 x10 3/uL           Radiologic Studies -   Radiology Results (24 Hour)     Procedure Component Value Units Date/Time    Ribs Right with PA Chest [[id number]] Collected:  09/21/16 4098    Order Status:  Completed Updated:  09/21/16 1191    Narrative:       Examination: Right rib series 4 views.    HISTORY: Fall.    COMPARISON: 05/11/2016.    FINDINGS:  Lungs clear.  Heart size normal.  Ribs intact.      Impression:         Normal.    Adaline Sill, MD   09/21/2016 6:33 AM    US Abdomen Complete [[id number]] Collected:  09/21/16 0315    Order Status:  Completed Updated:  09/21/16 0320    Narrative:       History: Right upper quadrant pain.    COMPARISON: None.    FINDINGS:  Aorta normal caliber.    Pancreas not well visualized.    IVC visualized.    Liver normal size and normal echogenicity. No focal lesion or  intrahepatic bile duct dilatation.    Gallbladder normal.    Common bile duct 0.4 cm caliber.    Right kidney 9.8 cm, normal corticomedullary differentiation and flow.    Spleen normal size.    Left kidney 12.9 cm, normal corticomedullary differentiation and flow.    No ascites.      Impression:         Normal.    Adaline Sill, MD   09/21/2016 3:16 AM      .    Medical Decision Making   I am the first provider for this patient.    I reviewed the vital signs, available nursing notes, past medical history, past surgical history, family history and social history.    Vital Signs-Reviewed the patient's vital signs.     Patient Vitals for the past 12 hrs:   BP Temp Pulse Resp   09/21/16 0833 137/76 - 83 17   09/21/16 0628 154/73 97.8 F (36.6 C) 67 18   09/21/16 0530 - - 72 17   09/21/16 0412 133/65 98 F (36.7 C) 76 18   09/21/16 0330 173/79 - 76 16   09/21/16 0138 165/79 98 F (36.7 C) 77 18   09/20/16 2311 132/63 97.6 F (36.4 C) 83 17       Cardiac  Monitor:  Rate: 75  Rhythm:  Normal Sinus Rhythm     EKG:  Interpreted by the Emergency Physician.   Time Interpreted: 2318   Rate: 79  Rhythm:  Normal Sinus  Ectopy:  None  Rate:  Normal  Conduction:  No blocks  ST Segments:  No acute ST segment changes  T Waves:  No acute T Wave changes  Axis:  Normal  Clinical Impression:  Non-specific EKG    Old Medical Records: Old medical records.     Doctor's Notes     ED Course:   1:50 AM - Pt still c/o pain. Updated on lab results so far.     3:51 AM - Pt c/o continued pain. Updated pt on all lab and [place] results.     6:07 AM - Updated pt on elevated D-dimer result. Pt has had chronically elevated d-dimer, is allergic to IV contrast, and has  no hypoxia, tachycardia, tachypnea, or hypotension. Unlikely to be PE. Pt mentions episode yesterday where she had a brief syncopal episode after standing up from a lying down position to go to the bathroom and fell in the hallway. Pt's pain could possibly be rib contusion/fracture. Will check x-ray and reassess for disposition.     6:46 -Pt had an episode of dry heaving during her x-ray.    6:46 AM - Updated pt on CXR results. Pt states she feels improved but still has TTP to the R chest wall. Pt agrees w/ plan to go home and f/u w/ her PCP. However, with the medications the pt has received in the ED so far, she does not feel safe to drive home at this point. We will let the pt metabolize her medications prior to discharge. Dr. Joycelyn Das is aware of the pt and will d/c when ready.     6:48 AM  Discussed case with Dr. Joycelyn Das (EP), and provided instructions to follow-up on pending test results, re-evaluate the patient, and make the appropriate disposition accordingly.  He will assume care of the patient at this time.    Medical Decision Making:   Pt's right sided chest tenderness is likely due to contusion from prior fall that sounds to have been orthostatic in nature. No evidence of cardiac, pulmonary, infectious, surgical, or thrombotic  cause of symptoms. ED w/u yields no acutely emergent cause of symptoms and pt is comfortable with plan to d/c home to f/u with PMD as outpatient.     10:23 AM - Pt's IV removed and vomited s/p.    Diagnosis and Treatment Plan       Clinical Impression:   1. Non-cardiac chest pain        Treatment Plan:   ED Disposition     ED Disposition Condition Date/Time Comment    Discharge  Thu Sep 21, 2016  9:54 AM [name] Cook discharge to home/self care.    Condition at disposition: Stable            _______________________________    Attestations:  This note is prepared by Johnnette Barrios, acting as scribe for Ames Dura, DO, [place]. The scribe's documentation has been prepared under my direction and personally reviewed by me in its entirety.  I confirm that the note above accurately reflects all work, treatment, procedures, and medical decision making performed by me.     I am the first provider for this patient.      Ames Dura, DO, FACOEP is the primary emergency doctor of record.    _______________________________           Donny Pique, MD  09/21/16 [id number]

## 2016-09-21 NOTE — Discharge Instructions (Signed)
Musculoskeletal Chest Pain    You have been diagnosed with musculoskeletal chest pain.    Your pain is due to an injury or inflammation (swelling) of the muscles, ligaments, cartilage (soft bone), or bone in your chest. The pain is usually sharp and knife-like and becomes worse with twisting, bending, or moving. It commonly occurs in a small area, and can be irritated by pressing on it. There is usually no shortness of breath, lightheadedness, weakness, or sweaty feeling. Some children will have pain when taking a deep breath or when coughing. Exercise usually does not affect these symptoms.    Musculoskeletal chest pain is treated with anti-inflammatory medications like ibuprofen (Advil or Motrin) or naproxen (Aleve). Other pain medications are usually not needed. Depending on the reason for your symptoms, either warm or cool compresses (damp washcloths laid on the skin) may be helpful.    Most musculoskeletal chest pain improves over several days.    You do not need to follow up with a doctor unless your symptoms get worse or fail to improve in the next few days.    YOU SHOULD SEEK MEDICAL ATTENTION IMMEDIATELY, EITHER HERE OR AT THE NEAREST EMERGENCY DEPARTMENT, IF ANY OF THE FOLLOWING OCCURS:   Your pain gets worse.   Your pain makes you feel short of breath, nauseated, or sweaty.   You notice that your pain gets worse as you walk, go up stairs, or exert yourself.   You have any weakness or lightheadedness with your pain.   Your pain makes breathing difficult.   You develop a swollen leg.   Your symptoms get worse or you have other concerns.

## 2017-04-14 DIAGNOSIS — K649 Unspecified hemorrhoids: Secondary | ICD-10-CM | POA: Diagnosis present

## 2017-04-14 DIAGNOSIS — Z7982 Long term (current) use of aspirin: Secondary | ICD-10-CM

## 2017-04-14 DIAGNOSIS — R1013 Epigastric pain: Secondary | ICD-10-CM

## 2017-04-14 DIAGNOSIS — K920 Hematemesis: Principal | ICD-10-CM | POA: Diagnosis present

## 2017-04-14 DIAGNOSIS — Z6841 Body Mass Index (BMI) 40.0 and over, adult: Secondary | ICD-10-CM

## 2017-04-14 DIAGNOSIS — D500 Iron deficiency anemia secondary to blood loss (chronic): Secondary | ICD-10-CM | POA: Diagnosis present

## 2017-04-14 DIAGNOSIS — Z8673 Personal history of transient ischemic attack (TIA), and cerebral infarction without residual deficits: Secondary | ICD-10-CM

## 2017-04-14 DIAGNOSIS — I10 Essential (primary) hypertension: Secondary | ICD-10-CM | POA: Diagnosis present

## 2017-04-14 DIAGNOSIS — G40909 Epilepsy, unspecified, not intractable, without status epilepticus: Secondary | ICD-10-CM | POA: Diagnosis present

## 2017-04-14 DIAGNOSIS — E785 Hyperlipidemia, unspecified: Secondary | ICD-10-CM | POA: Diagnosis present

## 2017-04-14 NOTE — ED Triage Notes (Signed)
[name] Cook is a 42 y.o. female presents to the ED for c/ chest pain and SOB x 1 hr PTA and abdominal pain and vomiting yesterday. No vomiting today. Pt. Reports noticing blood in her vomitus yesterday. Nitro @ 2330    BP 170/90   Pulse 70   Temp 98.5 F (36.9 C) (Oral)   Resp 18   Ht 5\' 10"  (1.778 m)   Wt 138.2 kg   LMP 03/29/2017   SpO2 100%   BMI 43.71 kg/m

## 2017-04-15 ENCOUNTER — Emergency Department: Payer: Medicaid Other

## 2017-04-15 ENCOUNTER — Observation Stay
Admission: EM | Admit: 2017-04-15 | Discharge: 2017-04-17 | DRG: 253 | Disposition: A | Discharge status: Home / Self Care | Payer: Medicaid Other | Attending: Internal Medicine | Admitting: Internal Medicine

## 2017-04-15 DIAGNOSIS — K920 Hematemesis: Secondary | ICD-10-CM | POA: Diagnosis present

## 2017-04-15 DIAGNOSIS — E785 Hyperlipidemia, unspecified: Secondary | ICD-10-CM | POA: Diagnosis present

## 2017-04-15 DIAGNOSIS — R202 Paresthesia of skin: Secondary | ICD-10-CM | POA: Diagnosis present

## 2017-04-15 DIAGNOSIS — R1013 Epigastric pain: Secondary | ICD-10-CM

## 2017-04-15 DIAGNOSIS — K649 Unspecified hemorrhoids: Secondary | ICD-10-CM | POA: Diagnosis present

## 2017-04-15 DIAGNOSIS — D500 Iron deficiency anemia secondary to blood loss (chronic): Secondary | ICD-10-CM | POA: Diagnosis present

## 2017-04-15 DIAGNOSIS — R079 Chest pain, unspecified: Secondary | ICD-10-CM | POA: Diagnosis present

## 2017-04-15 DIAGNOSIS — R569 Unspecified convulsions: Secondary | ICD-10-CM | POA: Diagnosis present

## 2017-04-15 DIAGNOSIS — Z8673 Personal history of transient ischemic attack (TIA), and cerebral infarction without residual deficits: Secondary | ICD-10-CM

## 2017-04-15 LAB — CBC AND DIFFERENTIAL
Absolute NRBC: 0 10*3/uL
Basophils Absolute Automated: 0.06 10*3/uL (ref 0.00–0.20)
Basophils Automated: 0.6 %
Eosinophils Absolute Automated: 0.31 10*3/uL (ref 0.00–0.70)
Eosinophils Automated: 3 %
Hematocrit: 31.1 % — ABNORMAL LOW (ref 37.0–47.0)
Hgb: 8.8 g/dL — ABNORMAL LOW (ref 12.0–16.0)
Immature Granulocytes Absolute: 0.04 10*3/uL
Immature Granulocytes: 0.4 %
Lymphocytes Absolute Automated: 2.64 10*3/uL (ref 0.50–4.40)
Lymphocytes Automated: 25.5 %
MCH: 22.1 pg — ABNORMAL LOW (ref 28.0–32.0)
MCHC: 28.3 g/dL — ABNORMAL LOW (ref 32.0–36.0)
MCV: 78.1 fL — ABNORMAL LOW (ref 80.0–100.0)
MPV: 12.6 fL — ABNORMAL HIGH (ref 9.4–12.3)
Monocytes Absolute Automated: 0.69 10*3/uL (ref 0.00–1.20)
Monocytes: 6.7 %
Neutrophils Absolute: 6.63 10*3/uL (ref 1.80–8.10)
Neutrophils: 63.8 %
Nucleated RBC: 0 /100 WBC (ref 0.0–1.0)
Platelets: 264 10*3/uL (ref 140–400)
RBC: 3.98 10*6/uL — ABNORMAL LOW (ref 4.20–5.40)
RDW: 17 % — ABNORMAL HIGH (ref 12–15)
WBC: 10.37 10*3/uL (ref 3.50–10.80)

## 2017-04-15 LAB — COMPREHENSIVE METABOLIC PANEL
ALT: 12 U/L (ref 0–55)
AST (SGOT): 16 U/L (ref 5–34)
Albumin/Globulin Ratio: 0.9 (ref 0.9–2.2)
Albumin: 3.8 g/dL (ref 3.5–5.0)
Alkaline Phosphatase: 86 U/L (ref 37–106)
Anion Gap: 7 (ref 5.0–15.0)
BUN: 13 mg/dL (ref 7.0–19.0)
Bilirubin, Total: 0.6 mg/dL (ref 0.2–1.2)
CO2: 27 mEq/L (ref 22–29)
Calcium: 8.8 mg/dL (ref 8.5–10.5)
Chloride: 106 mEq/L (ref 100–111)
Creatinine: 0.8 mg/dL (ref 0.6–1.0)
Globulin: 4.4 g/dL — ABNORMAL HIGH (ref 2.0–3.6)
Glucose: 80 mg/dL (ref 70–100)
Potassium: 3.9 mEq/L (ref 3.5–5.1)
Protein, Total: 8.2 g/dL (ref 6.0–8.3)
Sodium: 140 mEq/L (ref 136–145)

## 2017-04-15 LAB — GFR: EGFR: >60

## 2017-04-15 LAB — TROPONIN I
Troponin I: <0.01 ng/mL (ref 0.00–0.09)
Troponin I: <0.01 ng/mL (ref 0.00–0.09)
Troponin I: <0.01 ng/mL (ref 0.00–0.09)

## 2017-04-15 LAB — HEMOLYSIS INDEX: Hemolysis Index: 21 — ABNORMAL HIGH (ref 0–18)

## 2017-04-15 LAB — HEMOGLOBIN AND HEMATOCRIT, BLOOD
Hematocrit: 29.8 % — ABNORMAL LOW (ref 37.0–47.0)
Hgb: 8.5 g/dL — ABNORMAL LOW (ref 12.0–16.0)

## 2017-04-15 MED ORDER — NITROGLYCERIN 0.4 MG SL SUBL
0.40 mg | SUBLINGUAL_TABLET | SUBLINGUAL | Status: DC | PRN
Start: 2017-04-15 — End: 2017-04-17
  Administered 2017-04-15: 09:00:00 0.4 mg via SUBLINGUAL
  Filled 2017-04-15: qty 1

## 2017-04-15 MED ORDER — PANTOPRAZOLE SODIUM 40 MG IV SOLR
40.00 mg | Freq: Two times a day (BID) | INTRAVENOUS | Status: DC
Start: 2017-04-15 — End: 2017-04-17
  Administered 2017-04-15 – 2017-04-17 (×5): 40 mg via INTRAVENOUS
  Filled 2017-04-15 (×5): qty 40

## 2017-04-15 MED ORDER — LEVETIRACETAM 250 MG PO TABS
500.00 mg | ORAL_TABLET | Freq: Two times a day (BID) | ORAL | Status: DC
Start: 2017-04-15 — End: 2017-04-17
  Administered 2017-04-15 – 2017-04-17 (×4): 500 mg via ORAL
  Filled 2017-04-15 (×4): qty 2

## 2017-04-15 MED ORDER — ACETAMINOPHEN 325 MG PO TABS
650.00 mg | ORAL_TABLET | ORAL | Status: DC | PRN
Start: 2017-04-15 — End: 2017-04-15

## 2017-04-15 MED ORDER — FAMOTIDINE 10 MG/ML IV SOLN (WRAP)
20.00 mg | Freq: Once | INTRAVENOUS | Status: AC
Start: 2017-04-15 — End: 2017-04-15
  Administered 2017-04-15: 04:00:00 20 mg via INTRAVENOUS
  Filled 2017-04-15: qty 2

## 2017-04-15 MED ORDER — MORPHINE SULFATE 2 MG/ML IJ/IV SOLN (WRAP)
2.0000 mg | Status: DC | PRN
Start: 2017-04-15 — End: 2017-04-17
  Administered 2017-04-15 – 2017-04-17 (×13): 2 mg via INTRAVENOUS
  Filled 2017-04-15 (×13): qty 1

## 2017-04-15 MED ORDER — NITROGLYCERIN 2 % TD OINT
0.50 [in_us] | TOPICAL_OINTMENT | Freq: Once | TRANSDERMAL | Status: AC
Start: 2017-04-15 — End: 2017-04-15
  Administered 2017-04-15: 05:00:00 0.5 [in_us] via TOPICAL
  Filled 2017-04-15: qty 1

## 2017-04-15 MED ORDER — ACETAMINOPHEN 650 MG RE SUPP
650.00 mg | RECTAL | Status: DC | PRN
Start: 2017-04-15 — End: 2017-04-15

## 2017-04-15 MED ORDER — ATORVASTATIN CALCIUM 40 MG PO TABS
40.00 mg | ORAL_TABLET | Freq: Every evening | ORAL | Status: DC
Start: 2017-04-15 — End: 2017-04-17
  Administered 2017-04-15: 22:00:00 40 mg via ORAL
  Filled 2017-04-15: qty 1

## 2017-04-15 MED ORDER — MORPHINE SULFATE 4 MG/ML IJ/IV SOLN (WRAP)
4.0000 mg | Freq: Once | Status: AC
Start: 2017-04-15 — End: 2017-04-15
  Administered 2017-04-15: 05:00:00 4 mg via INTRAVENOUS
  Filled 2017-04-15: qty 1

## 2017-04-15 MED ORDER — ONDANSETRON 4 MG PO TBDP
4.00 mg | ORAL_TABLET | Freq: Four times a day (QID) | ORAL | Status: DC | PRN
Start: 2017-04-15 — End: 2017-04-17

## 2017-04-15 MED ORDER — ONDANSETRON HCL 4 MG/2ML IJ SOLN
4.00 mg | Freq: Once | INTRAMUSCULAR | Status: AC
Start: 2017-04-15 — End: 2017-04-15

## 2017-04-15 MED ORDER — NALOXONE HCL 0.4 MG/ML IJ SOLN (WRAP)
0.20 mg | INTRAMUSCULAR | Status: DC | PRN
Start: 2017-04-15 — End: 2017-04-17

## 2017-04-15 MED ORDER — ONDANSETRON HCL 4 MG/2ML IJ SOLN
4.00 mg | Freq: Four times a day (QID) | INTRAMUSCULAR | Status: DC | PRN
Start: 2017-04-15 — End: 2017-04-17

## 2017-04-15 MED ORDER — SODIUM CHLORIDE 0.9 % IV SOLN
100.00 mL/h | INTRAVENOUS | Status: AC
Start: 2017-04-15 — End: 2017-04-16
  Administered 2017-04-15 (×2): 100 mL/h via INTRAVENOUS

## 2017-04-15 MED ORDER — ALUM & MAG HYDROXIDE-SIMETH 200-200-20 MG/5ML PO SUSP
30.00 mL | Freq: Once | ORAL | Status: AC
Start: 2017-04-15 — End: 2017-04-15
  Administered 2017-04-15: 04:00:00 30 mL via ORAL
  Filled 2017-04-15: qty 30

## 2017-04-15 MED ORDER — ONDANSETRON HCL 4 MG/2ML IJ SOLN
INTRAMUSCULAR | Status: AC
Start: 2017-04-15 — End: 2017-04-15
  Administered 2017-04-15: 05:00:00 4 mg via INTRAVENOUS
  Filled 2017-04-15: qty 2

## 2017-04-15 MED ORDER — ONDANSETRON HCL 4 MG/5ML ORAL SYRINGE
4.00 mg | Freq: Once | ORAL | Status: DC
Start: 2017-04-15 — End: 2017-04-15

## 2017-04-15 MED ORDER — LIDOCAINE VISCOUS 2 % MT SOLN
10.00 mL | Freq: Once | OROMUCOSAL | Status: AC
Start: 2017-04-15 — End: 2017-04-15
  Administered 2017-04-15: 04:00:00 10 mL via OROMUCOSAL
  Filled 2017-04-15: qty 15

## 2017-04-15 MED ORDER — LISINOPRIL 10 MG PO TABS
40.00 mg | ORAL_TABLET | Freq: Every day | ORAL | Status: DC
Start: 2017-04-15 — End: 2017-04-17
  Administered 2017-04-15 – 2017-04-17 (×3): 40 mg via ORAL
  Filled 2017-04-15 (×3): qty 4

## 2017-04-15 NOTE — Progress Notes (Signed)
Pt c/o chest pain. 8/10 Mid area, pressure and radiating to the left shoulder.  Dr. Milagros Evener made aware. Order for nitro and morphine placed.  Nitro administered with some effect. Extreme headache followed. Ice packed provided. Pt states relief.  Morphine given PRN.  Pain is currently 5/10. Pt states getting better.   2 L NC placed.

## 2017-04-15 NOTE — ED Provider Notes (Signed)
EMERGENCY DEPARTMENT HISTORY AND PHYSICAL EXAM     Physician/Midlevel provider first contact with patient: 04/15/17 0146         Date: 04/15/2017  Patient Name: [name] Cook    History of Presenting Illness     Chief Complaint   Patient presents with   . Abdominal Pain   . Chest Pain       History Provided By: Patient    Chief Complaint: Abd pain  Duration: 3 days  Timing:  Intermittent  Location: LUQ  Quality: uncomfortable  Severity: Moderate  Exacerbating factors: none  Alleviating factors: none  Associated Symptoms: CP, lack of appetite, vomiting, nausea  Pertinent Negatives: SOB    Additional History: [name] Cook is a 42 y.o. female presenting to the ED with intermittent uncomfortable LUQ abd pain with associated nausea, lack of appetite, and vomiting for the past 3 days. Pt c/o constant CP since yesterday which began after 3 episodes of emesis. Pt notices streaks of blood in her vomit. Pt denies SOB. Pt has hx of seizure and TiA.      PCP: Pcp, [facility], MD  SPECIALISTS:    Current Facility-Administered Medications   Medication Dose Route Frequency Provider Last Rate Last Dose   . aspirin EC tablet 81 mg  81 mg Oral Daily Jolyn Lent, MD   81 mg at 04/17/17 0945   . atorvastatin (LIPITOR) tablet 40 mg  40 mg Oral QHS Carmelina Paddock, MD   40 mg at 04/15/17 2203   . diphenhydrAMINE (BENADRYL) capsule 25 mg  25 mg Oral Q6H PRN Jolyn Lent, MD   25 mg at 04/16/17 0928   . lactated ringers infusion   Intravenous Continuous Kirshner, Rand L, DO   Stopped at 04/16/17 1557   . levETIRAcetam (KEPPRA) tablet 500 mg  500 mg Oral Q12H [facility] Carmelina Paddock, MD   500 mg at 04/17/17 0945   . lisinopril (PRINIVIL,ZESTRIL) tablet 40 mg  40 mg Oral Daily Carmelina Paddock, MD   40 mg at 04/17/17 0945   . morphine injection 2 mg  2 mg Intravenous Q4H PRN Jolyn Lent, MD   2 mg at 04/17/17 1415   . naloxone [facility]) injection 0.2 mg  0.2 mg Intravenous PRN Carmelina Paddock, MD       . nitroglycerin  (NITROSTAT) SL tablet 0.4 mg  0.4 mg Sublingual Q5 Min PRN Carmelina Paddock, MD   0.4 mg at 04/15/17 0924   . ondansetron (ZOFRAN-ODT) disintegrating tablet 4 mg  4 mg Oral Q6H PRN Carmelina Paddock, MD        Or   . ondansetron (ZOFRAN) injection 4 mg  4 mg Intravenous Q6H PRN Carmelina Paddock, MD       . Melene Muller ON 04/18/2017] pantoprazole (PROTONIX) EC tablet 40 mg  40 mg Oral BID AC Abdulaaima, Jolinda Croak, MD       . pantoprazole (PROTONIX) injection 40 mg  40 mg Intravenous BID Jolyn Lent, MD   40 mg at 04/17/17 0945       Past History     Past Medical History:  Past Medical History:   Diagnosis Date   . Hypercholesteremia    . Hypertension    . Seizures    . Stroke     2015 and 2017, right side   . TIA (transient ischemic attack) 2017       Past Surgical History:  Past Surgical History:   Procedure Laterality Date   . APPENDECTOMY     .  CHOLECYSTECTOMY     . EGD, BIOPSY N/A 04/16/2017    Procedure: EGD, BIOPSY;  Surgeon: Pershing Proud, MD;  Location: [facility];  Service: Gastroenterology;  Laterality: N/A;       Family History:  Family History   Problem Relation Age of Onset   . Myocardial Infarction Father 58   . Deep vein thrombosis Father        Social History:  Social History   Substance Use Topics   . Smoking status: Never Smoker   . Smokeless tobacco: Never Used   . Alcohol use No       Allergies:  Allergies   Allergen Reactions   . Contrast [Iodinated Diagnostic Agents] Anaphylaxis     Patient woke up in ICU after having CT with contrast, last remembers being in CT   . Fioricet [Butalbital-Apap-Caffeine] Hives   . Iodine    . Motrin [Ibuprofen] Swelling   . Percocet [Oxycodone-Acetaminophen]    . Shellfish-Derived Products    . Toradol [Ketorolac Tromethamine]    . Tramadol    . Tylenol [Acetaminophen] Hives       Review of Systems     Review of Systems   Constitutional: Negative for activity change, chills and fever.   HENT: Negative for congestion and sore throat.    Eyes: Negative for pain and  redness.   Respiratory: Negative for cough and shortness of breath.    Cardiovascular: Positive for chest pain. Negative for palpitations.   Gastrointestinal: Positive for abdominal pain, nausea and vomiting. Negative for diarrhea.   Genitourinary: Negative for dysuria and hematuria.   Musculoskeletal: Negative for arthralgias and myalgias.   Skin: Negative for pallor and rash.   Neurological: Negative for light-headedness and headaches.   Psychiatric/Behavioral: Negative for suicidal ideas. The patient is not nervous/anxious.    All other systems reviewed and are negative.      Physical Exam   BP 118/66   Pulse 74   Temp 97.5 F (36.4 C) (Oral)   Resp 18   Ht 5\' 10"  (1.778 m)   Wt 139.3 kg   LMP 03/29/2017   SpO2 94%   BMI 44.05 kg/m     Physical Exam   Constitutional: She is oriented to person, place, and time. She appears well-developed and well-nourished.   HENT:   Head: Normocephalic and atraumatic.   Eyes: Pupils are equal, round, and reactive to light. EOM are normal.   Neck: Normal range of motion. Neck supple.   Cardiovascular: Normal rate, regular rhythm, normal heart sounds and intact distal pulses.    Pulmonary/Chest: Effort normal and breath sounds normal.   Abdominal: Soft. Bowel sounds are normal. There is tenderness (LUQ tenderness to palpation). There is no rebound.   Musculoskeletal: Normal range of motion. She exhibits no deformity.   Neurological: She is alert and oriented to person, place, and time. No cranial nerve deficit.   Skin: Skin is warm and dry. Capillary refill takes less than 2 seconds.   Psychiatric: She has a normal mood and affect. Her behavior is normal.   Nursing note and vitals reviewed.      Diagnostic Study Results     Labs -     Results     Procedure Component Value Units Date/Time    Basic Metabolic Panel [[id number]]  (Abnormal) Collected:  04/17/17 0440    Specimen:  Blood Updated:  04/17/17 0703     Glucose 98 mg/dL      BUN 9.0 mg/dL  Creatinine 0.8 mg/dL       Calcium 8.4 (L) mg/dL      Sodium 981 mEq/L      Potassium 3.5 mEq/L      Chloride 106 mEq/L      CO2 26 mEq/L      Anion Gap 6.0    Hemolysis index [[id number]] Collected:  04/17/17 0440     Updated:  04/17/17 0703     Hemolysis Index 1    GFR [[id number]] Collected:  04/17/17 0440     Updated:  04/17/17 0703     EGFR >60.0    CBC and differential [[id number]]  (Abnormal) Collected:  04/17/17 0440    Specimen:  Blood from Blood Updated:  04/17/17 0636     WBC 7.94 x10 3/uL      Hgb 8.5 (L) g/dL      Hematocrit 84.1 (L) %      Platelets 225 x10 3/uL      RBC 3.84 (L) x10 6/uL      MCV 77.3 (L) fL      MCH 22.1 (L) pg      MCHC 28.6 (L) g/dL      RDW 16 (H) %      MPV 13.0 (H) fL      Neutrophils 67.8 %      Lymphocytes Automated 24.3 %      Monocytes 4.7 %      Eosinophils Automated 2.3 %      Basophils Automated 0.5 %      Immature Granulocyte 0.4 %      Nucleated RBC 0.0 /100 WBC      Neutrophils Absolute 5.39 x10 3/uL      Abs Lymph Automated 1.93 x10 3/uL      Abs Mono Automated 0.37 x10 3/uL      Abs Eos Automated 0.18 x10 3/uL      Absolute Baso Automated 0.04 x10 3/uL      Absolute Immature Granulocyte 0.03 x10 3/uL      Absolute NRBC 0.00 x10 3/uL           Radiologic Studies -   Radiology Results (24 Hour)     Procedure Component Value Units Date/Time    MRI Brain WO Contrast [[id number]] Collected:  04/16/17 2230    Order Status:  Completed Updated:  04/17/17 0844    Narrative:       MRI BRAIN WO CONTRAST    Clinical history: TIA OR CVA         Comparison: MRI of the brain without contrast from 05/12/2016.    TECHNIQUE: Multiplanar sequences of the brain were obtained.    Findings: Stable 3 mm focus of increased T2 signal in the left corona  radiata. The ventricular system is midline and nondilated. There is no  evidence of an acute stroke or hemorrhage. The pituitary gland is  contained within the sella and has normal appearance.    There are no focal lesions in the midbrain, pons, medulla or  cerebellum.  There is no cerebellar tonsillar ectopia. Normal flow voids are present  in the major intracranial vessels and dural sinuses. Images of the  orbits are unremarkable. There is mucosal thickening of bilateral  sphenoidal sinuses. Minimal mucosal thickening of bilateral mastoids.           Impression:           Stable 3 mm focus of increased T2 signal in the left corona radiata.  This is most  likely a small focus of gliosis related to prior ischemia  or inflammation.       Joselyn Glassman, MD   04/17/2017 8:40 AM      .    Medical Decision Making   I am the first provider for this patient.    I reviewed the vital signs, available nursing notes, past medical history, past surgical history, family history and social history.    Vital Signs-Reviewed the patient's vital signs.     Patient Vitals for the past 12 hrs:   BP Temp Pulse Resp   04/17/17 1130 118/66 97.5 F (36.4 C) 74 18   04/17/17 0945 138/65 - - -   04/17/17 0700 128/79 97.9 F (36.6 C) 65 18   04/17/17 0436 141/82 97.6 F (36.4 C) 67 16       Pulse Oximetry Analysis - Normal 100% on RA.    EKG:  Interpreted by the EP.   Time Interpreted: 1156   Rate: 71   Rhythm: Normal Sinus Rhythm    Interpretation: Normal axis, normal intervals, no ST elevation   Comparison: No significant changes compared to prior EKG on 09/20/16    Old Medical Records: Nursing notes.     ED Course:   ED Course as of Apr 17 1414   Sun Apr 15, 2017   0243 baseline Hemoglobin: (!) 8.8 [MA]   0243 Impression       Mild cardiomegaly with no acute process.      [MA]      ED Course User Index  [MA] Hayla Hinger, Rochel Brome, MD     4:46 AM - Pt is complaining of severe CP. Pt now states she took 3 nitro's PTA.    4:51 AM - Discussed pt case with Dr. Oran Rein, sound, who accepts pt for hospitalization.      Provider Notes: LUQ pain/epigastric pain and chest pain c/f gastritis vs gerd vs upper gi bleed. Doubt dissection. Less likely ACS  -labs  -ekg  -cxr  -pain control      For Hospitalized  Patients:    1. Hospitalization Decision Time:  The decision to admit this patient was made by the emergency provider at 0451[time] on 04/15/2017     2. Aspirin: Aspirin was not given because the patient did not present with a stroke at the time of their Emergency Department evaluation.    3. Core Measures: Adult Chest Pain:  12-lead EKG was performed in the ED. Aspirin was not given.    Diagnosis     Clinical Impression:   1. Chest pain, unspecified type    2. Epigastric pain        Treatment Plan:   ED Disposition     ED Disposition Condition Date/Time Comment    Admit  Sun Apr 15, 2017  4:57 AM Admitting Physician: Oneida Alar [[id number]]   Diagnosis: Chest pain, unspecified type [[id number]]   Estimated Length of Stay: > or = to 2 midnights   Tentative Discharge Plan?: Home or Self Care [1]   Patient Class: Inpatient [101]              _______________________________      Attestations: This note is prepared by Can Ozyildirim, acting as scribe for Freda Jackson, MD.    Freda Jackson, MD - The scribe's documentation has been prepared under my direction and personally reviewed by me in its entirety.  I confirm that the note above accurately reflects all work, treatment, procedures, and  medical decision making performed by me.    _______________________________     Darcus Pester, MD  04/17/17 [phone]

## 2017-04-15 NOTE — Plan of Care (Signed)
Problem: Safety  Goal: Patient will be free from injury during hospitalization  Outcome: Progressing   04/15/17 1330   Goal/Interventions addressed this shift   Patient will be free from injury during hospitalization  Assess patient's risk for falls and implement fall prevention plan of care per policy;Provide and maintain safe environment;Use appropriate transfer methods;Ensure appropriate safety devices are available at the bedside;Include patient/ family/ care giver in decisions related to safety;Hourly rounding;Assess for patients risk for elopement and implement Elopement Risk Plan per policy       Problem: Pain  Goal: Pain at adequate level as identified by patient  Outcome: Progressing   04/15/17 1330   Goal/Interventions addressed this shift   Pain at adequate level as identified by patient Identify patient comfort function goal;Assess for risk of opioid induced respiratory depression, including snoring/sleep apnea. Alert healthcare team of risk factors identified.;Assess pain on admission, during daily assessment and/or before any "as needed" intervention(s);Reassess pain within 30-60 minutes of any procedure/intervention, per Pain Assessment, Intervention, Reassessment (AIR) Cycle;Evaluate if patient comfort function goal is met;Evaluate patient's satisfaction with pain management progress;Offer non-pharmacological pain management interventions       Problem: Hemodynamic Status: Cardiac  Goal: Stable vital signs and fluid balance  Outcome: Progressing   04/15/17 1330   Goal/Interventions addressed this shift   Stable vital signs and fluid balance Monitor/assess vital signs and telemetry per unit protocol;Weigh on admission and record weight daily;Assess signs and symptoms associated with cardiac rhythm changes;Monitor lab values;Monitor intake/output per unit protocol and/or LIP order       Problem: Chest Pain  Goal: Vital signs and cardiac rhythm stable  Outcome: Progressing   04/15/17 1330    Goal/Interventions addressed this shift   Vital signs and cardiac rhythm stable Monitor [name] Cook vital signs/cardiac rhythms;Monitor labs;Assess the need for oxygen therapy and administer as ordered     Goal: Cardiac pain management  Outcome: Progressing   04/15/17 1330   Goal/Interventions addressed this shift   Cardiac pain management  Assess/report chest pain/or related discomfort to LIP immediately;Instruct patient to report any change in pain status;Assess pain/or related discomfort on admission, during daily assessment, before and after any intervention;Include patient and patient care companion in decisions related to pain management       Problem: Altered GI Function  Goal: Fluid and electrolyte balance are achieved/maintained  Outcome: Progressing   04/15/17 1330   Goal/Interventions addressed this shift   Fluid and electrolyte balance are achieved/maintained Monitor intake and output every shift;Monitor/assess lab values and report abnormal values;Provide adequate hydration;Monitor daily weight;Assess and reassess fluid and electrolyte status     Goal: No bleeding  Outcome: Progressing   04/15/17 1330   Goal/Interventions addressed this shift   No bleeding  Monitor and assess vitals and hemodynamic parameters;Monitor/assess lab values and report abnormal values;Assess for bruising/petechia       Comments:   Pt c/o of chest pain. Mid chest. Pressure and radiating to left shoulder.  No nausea or vomiting noted. Pt placed on 2L O2 for oxygen support.  Morphine given PRN with some relief.  Troponin drawn per order. 0.01 x 2.  H/H closely monitored. No vomiting or blood noted at this time.  Pt placed on clear liquid diet. NPO post midnight per order for EGD tom.  GI following.  Hourly rounding continued. Safety and fall precautions maintained.

## 2017-04-15 NOTE — Consults (Signed)
CONSULTATION NOTE    [address], [place], [place] [address]  [phone]  [name]    Date of admission: 04/15/2017  Date of consult: 04/15/17      GI Attending Note  Patient seen and evaluated with continued complaints of left-sided abdominal pain, but she's otherwise stable with no further vomiting. Continue PPI and will plan for and EGD tomorrow. NPO after MN.     Bonnita Levan II, MD    Assessment:  Epigastric pain  Hematemesis  Microcytic anemia  - presented w/ H/H of 8.8/31.1, which has decreased from 9.9/33.7 on 09/21/2016   - baseline Hgb 8-10    [name] Cook is a 42 y.o. female w/ significant PMHx of TIA and seizures presented to the ER c/o epigastric pain and several episodes of hematemesis for past 3 days. At presentation, is noted to have H/H of 8.8/31.1, which has decreased from 9.9/33.7 on 09/2016. Ddx: PUD, MWT, etc.   ___________________________  Plan:  PPI IV BID  Diet as tolerated currently  NPO at midnight for EGD tomorrow  Further recs pending above    Toma Copier, PA   12:26 PM    Thank you for allowing [place] to see and participate in this patient's care.  This case will be discussed with Dr. Maple Hudson who will see this patient as well today and will write an accompanying GI consultation and treatment plan.  ________________________    Referring Physician: Dr. Milagros Evener    Consulting Physician: Dr. Maple Hudson     Reason for consultation: Upper GIB    Chief complaint: hematemesis    HPI:  [name] Cook is a 42 y.o. female w/ PMHx of TIA, HTN, seizures presented to the ER c/o epigastric pain and hematemesis x 3 days. Reports pain is dull, w/o radiation, unrelated to oral intake. Vomiting nearly 1/2 cupful of blood w/ each episode. She reports EGD approx 4-5 years ago for evaluation of epigastric pain that was negative. She denies hx of hematemesis, NSAID use, ASA use, blood thinners, melena, hematochezia, peptobismol, Iron supplements.     Past Medical History:   Diagnosis Date    . Hypercholesteremia    . Hypertension    . Seizures    . Stroke     2015 and 2017   . TIA (transient ischemic attack) 2017       Past Surgical History:   Procedure Laterality Date   . APPENDECTOMY         Allergies   Allergen Reactions   . Contrast [Iodinated Diagnostic Agents] Anaphylaxis     Patient woke up in ICU after having CT with contrast, last remembers being in CT   . Fioricet [Butalbital-Apap-Caffeine] Hives   . Iodine    . Motrin [Ibuprofen] Swelling   . Percocet [Oxycodone-Acetaminophen]    . Shellfish-Derived Products    . Toradol [Ketorolac Tromethamine]    . Tramadol    . Tylenol [Acetaminophen] Hives       Social History     Social History   . Marital status: Single     Spouse name: N/A   . Number of children: N/A   . Years of education: N/A     Occupational History   . Not on file.     Social History Main Topics   . Smoking status: Never Smoker   . Smokeless tobacco: Never Used   . Alcohol use No   . Drug use: No   . Sexual activity: Not  on file     Other Topics Concern   . Not on file     Social History Narrative   . No narrative on file       Family History   Problem Relation Age of Onset   . Myocardial Infarction Father 43   . Deep vein thrombosis Father        Current Discharge Medication List      CONTINUE these medications which have NOT CHANGED    Details   atorvastatin (LIPITOR) 40 MG tablet Take by mouth.      LevETIRAcetam (KEPPRA PO) Take 500 mg by mouth 2 (two) times daily.         lidocaine (LIDODERM) 5 % Place 1 patch onto the skin daily. Remove & Discard patch within 12 hours or as directed by MD  Qty: 6 each, Refills: 0      nitroglycerin (NITROSTAT) 0.4 MG SL tablet Place under the tongue.             Current Facility-Administered Medications   Medication Dose Route Frequency Last Rate Last Dose   . 0.9%  NaCl infusion  100 mL/hr Intravenous Continuous 100 mL/hr at 04/15/17 0742 100 mL/hr at 04/15/17 0742   . acetaminophen (TYLENOL) tablet 650 mg  650 mg Oral Q4H PRN        Or    . acetaminophen (TYLENOL) suppository 650 mg  650 mg Rectal Q4H PRN       . atorvastatin (LIPITOR) tablet 40 mg  40 mg Oral QHS       . levETIRAcetam (KEPPRA) tablet 500 mg  500 mg Oral Q12H [facility]   500 mg at 04/15/17 0923   . morphine injection 2 mg  2 mg Intravenous Q4H PRN   2 mg at 04/15/17 0910   . naloxone [facility]) injection 0.2 mg  0.2 mg Intravenous PRN       . nitroglycerin (NITRO-BID) 2 % ointment 0.5 inch  0.5 inch Topical Once   0.5 inch at 04/15/17 0506   . nitroglycerin (NITROSTAT) SL tablet 0.4 mg  0.4 mg Sublingual Q5 Min PRN   0.4 mg at 04/15/17 0924   . ondansetron (ZOFRAN-ODT) disintegrating tablet 4 mg  4 mg Oral Q6H PRN        Or   . ondansetron (ZOFRAN) injection 4 mg  4 mg Intravenous Q6H PRN       . pantoprazole (PROTONIX) injection 40 mg  40 mg Intravenous BID   40 mg at 04/15/17 1610       Review of Systems  Constitutional  Negative for fevers or weight loss   Skin  Negative for rash   HENT  Negative for sore throat   Eyes  Negative for blurred vision   Cardiovascular  Negative for chest pain   Respiratory  Negative for SOB or cough   Gastrointestinal  see HPI   Genitourinary  Negative for dysuria, hematuria, or frequency   Musculoskeletal  Negative for joint pain   Endo  Negative for diabetes   Heme  Negative for anemia   Neurological  Negative for syncope   Psych  Negative for depression       Physical Exam  BP 140/67   Pulse 61   Temp (!) 96.8 F (36 C) (Oral)   Resp 18   Ht 1.778 m (5\' 10" )   Wt 137.7 kg (303 lb 9.6 oz)   LMP 03/29/2017   SpO2 98%   BMI 43.56 kg/m  General Appearance:    no acute distress, appears stated age and looks comfortable   HEENT:    Normocephalic, without obvious abnormality, atraumatic, sclera anicteric, oral mucosa pink/moist   Lungs:     Clear to auscultation bilaterally, no wheezing/rhonchi/rales    Heart:    Regular rate and rhythm, S1 and S2 normal, no murmur, rub or gallops appreciated   Abdomen:    Soft, NT, ND, BS+   Rectal:   deferred     Extremities:   Extremities normal, atraumatic, no cyanosis or edema   Skin:   Skin color, texture, turgor normal, no rashes or lesions and no jaundice   Neurologic:   AAOx3, no focal deficits           Psychological: normal affect    Laboratory Data reviewed:      Recent Labs  Lab 04/15/17  0133   WBC 10.37   Hgb 8.8*   Hematocrit 31.1*   Platelets 264   MCV 78.1*   Neutrophils 63.8       Recent Labs  Lab 04/15/17  0133   Sodium 140   Potassium 3.9   Chloride 106   CO2 27   BUN 13.0   Creatinine 0.8   Glucose 80   Calcium 8.8   Protein, Total 8.2   Albumin 3.8   AST (SGOT) 16   ALT 12   Alkaline Phosphatase 86   Bilirubin, Total 0.6     Glucose:    Recent Labs  Lab 04/15/17  0133   Glucose 80           Radiological Imaging reviewed:  Chest Ap Portable    Result Date: 04/15/2017  Mild cardiomegaly with no acute process. Jorene Guest, MD 04/15/2017 2:18 AM

## 2017-04-15 NOTE — H&P (Signed)
[facility]      Patient: [name] Cook  Date: 04/15/2017   DOB: 1975-03-29  Admission Date: 04/15/2017   MRN: [id number]  Attending: Carmelina Paddock         Chief Complaint   Patient presents with   . Abdominal Pain   . Chest Pain      History Gathered From: patient     HISTORY AND PHYSICAL     [name] Cook is a 42 y.o. female with a PMHx of HTN, seizure, TIA who presented with hematemesis  Patient reports he had 3 days of nausea, vomiting, epigastric abdominal pain. She was vomiting forcibly. She had 3 episodes of bright red blood around half cup. Associated with epigastric, pain above sternum  She denies using NSAIDs, aspirin. No cough, chest pain, shortness of breath.    Past Medical History:   Diagnosis Date   . Hypercholesteremia    . Hypertension    . Seizures    . Stroke     2015 and 2017   . TIA (transient ischemic attack) 2017       Past Surgical History:   Procedure Laterality Date   . APPENDECTOMY         Prior to Admission medications    Medication Sig Start Date End Date Taking? Authorizing Provider   atorvastatin (LIPITOR) 40 MG tablet Take by mouth.   Yes [provider]   LevETIRAcetam (KEPPRA PO) Take 500 mg by mouth 2 (two) times daily.      Yes [provider]   lidocaine (LIDODERM) 5 % Place 1 patch onto the skin daily. Remove & Discard patch within 12 hours or as directed by MD 06/19/15  Yes Hmaid, Abed K, MD   nitroglycerin (NITROSTAT) 0.4 MG SL tablet Place under the tongue. 10/24/14  Yes [provider]   clopidogrel (PLAVIX) 75 mg tablet Take by mouth.  04/15/17 Yes [provider]   levETIRAcetam (KEPPRA) 500 MG tablet Take by mouth. 02/04/16 04/15/17 Yes [provider]   LISINOPRIL PO Take 40 mg by mouth daily.      04/15/17 Yes [provider]   ondansetron (ZOFRAN ODT) 4 MG disintegrating tablet Take 1 tablet (4 mg total) by mouth every 6 (six) hours as needed for Nausea. 09/21/16 04/15/17 Yes Ames Dura, DO   pantoprazole (PROTONIX)  40 MG tablet Take by mouth.  04/15/17 Yes [provider]   acetaminophen (TYLENOL) 500 MG tablet Take by mouth. 03/19/16 04/15/17  [provider]   aspirin 81 MG chewable tablet Chew 1 tablet (81 mg total) by mouth daily.  Patient taking differently: Chew 325 mg by mouth daily.     08/20/15 04/15/17  Imogene Burn, MD   aspirin EC 81 MG EC tablet Take by mouth. 09/16/13 04/15/17  [provider]   diclofenac (VOLTAREN) 50 MG EC tablet Take 1 tablet (50 mg total) by mouth 2 (two) times daily. 09/21/16 04/15/17  Ames Dura, DO   lisinopril (PRINIVIL,ZESTRIL) 5 MG tablet Take by mouth.  04/15/17  [provider]   Omega-3 Fatty Acids (FISH OIL) 1000 MG Cap capsule Take 1 capsule (1,000 mg total) by mouth 2 (two) times daily. 05/13/16 04/15/17  Santa Genera, MD       Allergies   Allergen Reactions   . Contrast [Iodinated Diagnostic Agents] Anaphylaxis     Patient woke up in ICU after having CT with contrast, last remembers being in CT   . Fioricet [Butalbital-Apap-Caffeine] Hives   .  Iodine    . Motrin [Ibuprofen] Swelling   . Percocet [Oxycodone-Acetaminophen]    . Shellfish-Derived Products    . Toradol [Ketorolac Tromethamine]    . Tramadol    . Tylenol [Acetaminophen] Hives     CODE STATUS: FULL CODE    PRIMARY CARE MD: Pcp, [facility], MD    Family History   Problem Relation Age of Onset   . Myocardial Infarction Father 22   . Deep vein thrombosis Father        Social History   Substance Use Topics   . Smoking status: Never Smoker   . Smokeless tobacco: Never Used   . Alcohol use No       REVIEW OF SYSTEMS   Positive for: hematemesis, nausea, vomiting, abdominal pain  Negative for: shortness of breath, black stool  All ROS completed and otherwise negative.    PHYSICAL EXAM     Vital Signs (most recent): BP 140/67   Pulse 61   Temp (!) 96.8 F (36 C) (Oral)   Resp 18   Ht 1.778 m (5\' 10" )   Wt 137.7 kg (303 lb 9.6 oz)   LMP 03/29/2017   SpO2 98%   BMI 43.56 kg/m    Constitutional: No apparent distress. Patient speaks freely in full sentences.   HEENT: NC/AT, PERRL, no scleral icterus or conjunctival pallor, no nasal discharge, MMM, oropharynx without erythema or exudate  Neck: trachea midline, supple, no cervical or supraclavicular lymphadenopathy or masses  Cardiovascular: RRR, normal S1 S2, no murmurs, gallops, palpable thrills, no JVD, Non-displaced PMI.  Respiratory: Normal rate. No retractions or increased work of breathing. Clear to auscultation and percussion bilaterally.  Gastrointestinal: +BS, non-distended, soft, non-tender, no rebound or guarding, no hepatosplenomegaly  Genitourinary: no suprapubic or costovertebral angle tenderness  Musculoskeletal: ROM and motor strength grossly normal. No clubbing, edema, or cyanosis. DP and radial pulses 2+ and symmetric.  Skin exam:  pink  Neurologic: EOMI, CN 2-12 grossly intact. no gross motor or sensory deficits  Psychiatric: AAOx3, affect and mood appropriate. The patient is alert, interactive, appropriate.  Capillary refill:  Normal          LABS & IMAGING     Recent Results (from the past 24 hour(s))   CBC with differential    Collection Time: 04/15/17  1:33 AM   Result Value Ref Range    WBC 10.37 3.50 - 10.80 x10 3/uL    Hgb 8.8 (L) 12.0 - 16.0 g/dL    Hematocrit 40.9 (L) 37.0 - 47.0 %    Platelets 264 140 - 400 x10 3/uL    RBC 3.98 (L) 4.20 - 5.40 x10 6/uL    MCV 78.1 (L) 80.0 - 100.0 fL    MCH 22.1 (L) 28.0 - 32.0 pg    MCHC 28.3 (L) 32.0 - 36.0 g/dL    RDW 17 (H) 12 - 15 %    MPV 12.6 (H) 9.4 - 12.3 fL    Neutrophils 63.8 None %    Lymphocytes Automated 25.5 None %    Monocytes 6.7 None %    Eosinophils Automated 3.0 None %    Basophils Automated 0.6 None %    Immature Granulocyte 0.4 None %    Nucleated RBC 0.0 0.0 - 1.0 /100 WBC    Neutrophils Absolute 6.63 1.80 - 8.10 x10 3/uL    Abs Lymph Automated 2.64 0.50 - 4.40 x10 3/uL    Abs Mono Automated 0.69 0.00 - 1.20 x10 3/uL  Abs Eos Automated 0.31 0.00 - 0.70  x10 3/uL    Absolute Baso Automated 0.06 0.00 - 0.20 x10 3/uL    Absolute Immature Granulocyte 0.04 0 x10 3/uL    Absolute NRBC 0.00 0 x10 3/uL   Comprehensive metabolic panel    Collection Time: 04/15/17  1:33 AM   Result Value Ref Range    Glucose 80 70 - 100 mg/dL    BUN 16.1 7.0 - 09.6 mg/dL    Creatinine 0.8 0.6 - 1.0 mg/dL    Sodium 045 409 - 811 mEq/L    Potassium 3.9 3.5 - 5.1 mEq/L    Chloride 106 100 - 111 mEq/L    CO2 27 22 - 29 mEq/L    Calcium 8.8 8.5 - 10.5 mg/dL    Protein, Total 8.2 6.0 - 8.3 g/dL    Albumin 3.8 3.5 - 5.0 g/dL    AST (SGOT) 16 5 - 34 U/L    ALT 12 0 - 55 U/L    Alkaline Phosphatase 86 37 - 106 U/L    Bilirubin, Total 0.6 0.2 - 1.2 mg/dL    Globulin 4.4 (H) 2.0 - 3.6 g/dL    Albumin/Globulin Ratio 0.9 0.9 - 2.2    Anion Gap 7.0 5.0 - 15.0   Troponin I    Collection Time: 04/15/17  1:33 AM   Result Value Ref Range    Troponin I <0.01 0.00 - 0.09 ng/mL   Hemolysis index    Collection Time: 04/15/17  1:33 AM   Result Value Ref Range    Hemolysis Index 21 (H) 0 - 18   GFR    Collection Time: 04/15/17  1:33 AM   Result Value Ref Range    EGFR >60.0    Troponin I    Collection Time: 04/15/17  9:32 AM   Result Value Ref Range    Troponin I <0.01 0.00 - 0.09 ng/mL   Hemoglobin and hematocrit, blood    Collection Time: 04/15/17  2:32 PM   Result Value Ref Range    Hgb 8.5 (L) 12.0 - 16.0 g/dL    Hematocrit 91.4 (L) 37.0 - 47.0 %           IMAGING:  Chest AP Portable [[id number]] Collected: 04/15/17 0217   Order Status: Completed Updated: 04/15/17 0222   Narrative:    HISTORY:Chest Pain.      COMPARISON: September 21, 2016    TECHNIQUE: XR CHEST AP PORTABLE     FINDINGS:    Lines and tubes: None    Lungs and hila: The lungs are clear. There is no pleural effusion. There  is no pneumothorax.    Heart and Mediastinum: The cardiac silhouette is mildly enlarged and  stable in size.     Bones and soft tissues: No acute abnormality.    Upper abdomen: Normal   Impression:      Mild cardiomegaly with no  acute process.             Markers:    Recent Labs  Lab 04/15/17  0932 04/15/17  0133   Troponin I <0.01 <0.01       EMERGENCY DEPARTMENT COURSE:  Orders Placed This Encounter   Procedures   . Chest AP Portable   . CBC with differential   . Comprehensive metabolic panel   . Troponin I   . Hemolysis index   . GFR   . CBC   . Basic Metabolic Panel   . Troponin I   .  Hemoglobin and hematocrit, blood   . Troponin I   . Diet clear liquid   . Diet NPO time specified   . ED Holding Orders Expire in 12 Hours   . Notify Admitting Attending ( Change in Condition)   . Notify Attending of Patient Arrival to Floor within 12 Hours   . Notify Physician (Vital Signs)   . Notify Physician (Lab Results)   . Vital Signs Q4HR   . Activity as Tolerated   . May be transported off monitor   . Notify Admitting Physician:Change in Condition/Abnormal VS   . Notify Admitting Physician:Chest Pain/EKG   . Pulse Oximetry   . Progressive Mobility Protocol   . Notify physician   . I/O   . Height   . Weight   . Skin assessment   . Place sequential compression device   . Maintain sequential compression device   . Education: Activity   . Education: Disease Process & Condition   . Education: Pain Management   . Education: Falls Risk   . Education: Smoking Cessation   . Telemetry 24 Hour Protocol   . Full Code   . ED Unit Sec Comm Order   . ECG 12 Lead   . Saline lock IV   . Admit to Inpatient   . [facility] ED Bed Request (Inpatient)   . Fall Precautions       ASSESSMENT & PLAN         Hematemesis  Blood loss anemia  Likely either mallory weiss tear or PUD/gastritis.   Start PPI IV BID  zofran prn  GI consult  Monitor H/H     Chest pain (06/17/2015)  Likely from above. R/o cardiac. Monitor on tele. Check troponin      Hypertension  Resume lisinopril     Hx of seizure  Resume Keppra      Nutrition  NPO after midnight    DVT/VTE Prophylaxis  scds      Signed,  Carmelina Paddock    04/15/2017 3:09 PM

## 2017-04-15 NOTE — Plan of Care (Signed)
Problem: Pain  Goal: Pain at adequate level as identified by patient  Outcome: Progressing   04/15/17 0647   Goal/Interventions addressed this shift   Pain at adequate level as identified by patient Identify patient comfort function goal;Assess for risk of opioid induced respiratory depression, including snoring/sleep apnea. Alert healthcare team of risk factors identified.;Assess pain on admission, during daily assessment and/or before any "as needed" intervention(s);Reassess pain within 30-60 minutes of any procedure/intervention, per Pain Assessment, Intervention, Reassessment (AIR) Cycle;Evaluate patient's satisfaction with pain management progress;Evaluate if patient comfort function goal is met;Offer non-pharmacological pain management interventions   Patient received pain meds in ED but patient states their effects have worn off. Paged MD about patient's pain. Rates chest pain 8/10

## 2017-04-15 NOTE — ED Notes (Signed)
NURSING NOTE FOR THE RECEIVING INPATIENT NURSE   ED NURSE Corky Blumstein   [place] [address]   ADMISSION INFORMATION   [name] Cook is a 42 y.o. female admitted with a diagnosis of:    1. Chest pain, unspecified type    2. Epigastric pain       Isolation: None  Holding Orders Confirmed:  Yes   NURSING CARE   Mental Status alert and oriented   ADL ADLs:            Independent with all ADLs  Ambulation:  no difficulty   Pertinent Information  and Safety Concerns Pt's abdominal and chest pain unrelieved with pepcid and GI cocktail. Reports she had 3 episodes of hematemesis 2 days ago.      VITAL SIGNS     Time of Last Set of Vitals 0443   Temperature 98.3   BP 171/81   Pulse 71   Respirations 22   Pulse OX 100        IV LINES     IV Catheter Size: 20 g    Peripheral IV 04/15/17 Left Antecubital (Active)   Site Assessment Clean;Dry;Intact 04/15/2017  1:32 AM   Line Status Saline Locked;Flushed;Blood return noted 04/15/2017  1:32 AM   Dressing Status Clean;Dry;Intact 04/15/2017  1:32 AM   Number of days: 0          LAB RESULTS     Labs Reviewed   CBC AND DIFFERENTIAL - Abnormal; Notable for the following:        Result Value    Hgb 8.8 (*)     Hematocrit 31.1 (*)     RBC 3.98 (*)     MCV 78.1 (*)     MCH 22.1 (*)     MCHC 28.3 (*)     RDW 17 (*)     MPV 12.6 (*)     All other components within normal limits   COMPREHENSIVE METABOLIC PANEL - Abnormal; Notable for the following:     Globulin 4.4 (*)     All other components within normal limits   HEMOLYSIS INDEX - Abnormal; Notable for the following:     Hemolysis Index 21 (*)     All other components within normal limits   TROPONIN I   GFR          .

## 2017-04-16 ENCOUNTER — Encounter: Payer: Self-pay | Admitting: Anesthesiology

## 2017-04-16 ENCOUNTER — Ambulatory Visit: Payer: Self-pay

## 2017-04-16 ENCOUNTER — Inpatient Hospital Stay: Payer: Medicaid Other | Admitting: Anesthesiology

## 2017-04-16 ENCOUNTER — Encounter
Admission: EM | Disposition: A | Discharge status: Home / Self Care | Payer: Self-pay | Source: Home / Self Care | Attending: Emergency Medicine

## 2017-04-16 ENCOUNTER — Other Ambulatory Visit: Payer: Medicaid Other

## 2017-04-16 ENCOUNTER — Inpatient Hospital Stay: Payer: Medicaid Other

## 2017-04-16 DIAGNOSIS — E785 Hyperlipidemia, unspecified: Secondary | ICD-10-CM | POA: Diagnosis present

## 2017-04-16 DIAGNOSIS — K920 Hematemesis: Secondary | ICD-10-CM

## 2017-04-16 LAB — CBC
Absolute NRBC: 0 10*3/uL
Hematocrit: 28.8 % — ABNORMAL LOW (ref 37.0–47.0)
Hgb: 8.3 g/dL — ABNORMAL LOW (ref 12.0–16.0)
MCH: 22.3 pg — ABNORMAL LOW (ref 28.0–32.0)
MCHC: 28.8 g/dL — ABNORMAL LOW (ref 32.0–36.0)
MCV: 77.4 fL — ABNORMAL LOW (ref 80.0–100.0)
MPV: 13.2 fL — ABNORMAL HIGH (ref 9.4–12.3)
Nucleated RBC: 0 /100 WBC (ref 0.0–1.0)
Platelets: 192 10*3/uL (ref 140–400)
RBC: 3.72 10*6/uL — ABNORMAL LOW (ref 4.20–5.40)
RDW: 16 % — ABNORMAL HIGH (ref 12–15)
WBC: 8.07 10*3/uL (ref 3.50–10.80)

## 2017-04-16 LAB — BASIC METABOLIC PANEL
Anion Gap: 7 (ref 5.0–15.0)
BUN: 9 mg/dL (ref 7.0–19.0)
CO2: 23 mEq/L (ref 22–29)
Calcium: 8.4 mg/dL — ABNORMAL LOW (ref 8.5–10.5)
Chloride: 107 mEq/L (ref 100–111)
Creatinine: 0.7 mg/dL (ref 0.6–1.0)
Glucose: 87 mg/dL (ref 70–100)
Potassium: 3.8 mEq/L (ref 3.5–5.1)
Sodium: 137 mEq/L (ref 136–145)

## 2017-04-16 LAB — GFR: EGFR: >60

## 2017-04-16 LAB — HEMOLYSIS INDEX: Hemolysis Index: 24 — ABNORMAL HIGH (ref 0–18)

## 2017-04-16 SURGERY — ESOPHAGOGASTRODUODENOSCOPY (EGD), BIOPSY
Anesthesia: Anesthesia MAC / Sedation | Site: Abdomen | Wound class: Clean Contaminated

## 2017-04-16 MED ORDER — GLYCOPYRROLATE 0.2 MG/ML IJ SOLN
INTRAMUSCULAR | Status: DC | PRN
Start: 2017-04-16 — End: 2017-04-16
  Administered 2017-04-16: 0.2 mg via INTRAVENOUS

## 2017-04-16 MED ORDER — PROPOFOL 10 MG/ML IV EMUL (WRAP)
INTRAVENOUS | Status: DC | PRN
Start: 2017-04-16 — End: 2017-04-16
  Administered 2017-04-16 (×6): 50 mg via INTRAVENOUS

## 2017-04-16 MED ORDER — LIDOCAINE HCL (PF) 2 % IJ SOLN
INTRAMUSCULAR | Status: AC
Start: 2017-04-16 — End: ?
  Filled 2017-04-16: qty 5

## 2017-04-16 MED ORDER — LORAZEPAM 2 MG/ML IJ SOLN
1.00 mg | Freq: Once | INTRAMUSCULAR | Status: AC
Start: 2017-04-16 — End: 2017-04-16
  Administered 2017-04-16: 22:00:00 1 mg via INTRAVENOUS
  Filled 2017-04-16: qty 1

## 2017-04-16 MED ORDER — LACTATED RINGERS IV SOLN
INTRAVENOUS | Status: DC
Start: 2017-04-16 — End: 2017-04-17

## 2017-04-16 MED ORDER — ONDANSETRON HCL 4 MG/2ML IJ SOLN
INTRAMUSCULAR | Status: AC
Start: 2017-04-16 — End: ?
  Filled 2017-04-16: qty 2

## 2017-04-16 MED ORDER — ONDANSETRON HCL 4 MG/2ML IJ SOLN
INTRAMUSCULAR | Status: DC | PRN
Start: 2017-04-16 — End: 2017-04-16
  Administered 2017-04-16: 4 mg via INTRAVENOUS

## 2017-04-16 MED ORDER — DIPHENHYDRAMINE HCL 25 MG PO CAPS
25.00 mg | ORAL_CAPSULE | Freq: Four times a day (QID) | ORAL | Status: DC | PRN
Start: 2017-04-16 — End: 2017-04-17
  Administered 2017-04-16: 09:00:00 25 mg via ORAL
  Filled 2017-04-16: qty 1

## 2017-04-16 MED ORDER — ASPIRIN 81 MG PO TBEC
81.00 mg | DELAYED_RELEASE_TABLET | Freq: Every day | ORAL | Status: DC
Start: 2017-04-16 — End: 2017-04-17
  Administered 2017-04-17: 10:00:00 81 mg via ORAL
  Filled 2017-04-16: qty 1

## 2017-04-16 MED ORDER — PROPOFOL 10 MG/ML IV EMUL (WRAP)
INTRAVENOUS | Status: AC
Start: 2017-04-16 — End: ?
  Filled 2017-04-16: qty 40

## 2017-04-16 MED ORDER — GLYCOPYRROLATE 0.2 MG/ML IJ SOLN
INTRAMUSCULAR | Status: AC
Start: 2017-04-16 — End: ?
  Filled 2017-04-16: qty 1

## 2017-04-16 MED ORDER — LIDOCAINE HCL 2 % IJ SOLN
INTRAMUSCULAR | Status: DC | PRN
Start: 2017-04-16 — End: 2017-04-16
  Administered 2017-04-16: 100 mg

## 2017-04-16 SURGICAL SUPPLY — 21 items
CANISTER 1000CC (Procedure Accessories) ×2 IMPLANT
FORCEPS BIOPSY L240 CM MICROMESH TEETH STREAMLINE CATHETER NEEDLE (Instrument) IMPLANT
FORCEPS BIOPSY L240 CM STANDARD CAPACITY (Instrument) ×1
FORCEPS BX STD CPC RJ 4 2.2MM 240CM STRL (Instrument) ×1
GOWN ISO YELLOW UNIVERSAL (Gown) ×4 IMPLANT
JELLY LUB EZ LF STRL H2O SOL NGRS TRNLU (Irrigation Solutions) ×2 IMPLANT
KIT CARRY ON ENDO PROCEDURE (Kits) ×2
KIT CARRY ON ENDO PROCEDURE CPK4557000001 (Kits) ×1 IMPLANT
KIT UNIVERSAL IRRIGATION SOL (Kits) ×2 IMPLANT
SOFT-CUF 2T ADULT SUB-MIN (Cuff) ×2 IMPLANT
SPONGE GAUZE L4 IN X W4 IN 16 PLY (Dressing) ×10
SPONGE GAUZE L4 IN X W4 IN 16 PLY MAXIMUM ABSORBENT USP TYPE VII (Dressing) ×10 IMPLANT
SPONGE GZE CTTN CRTY 4X4IN LF NS 16 PLY (Dressing) ×10
SYRINGE 50 ML GRADUATE NONPYROGENIC DEHP (Syringes, Needles) ×1
SYRINGE 50 ML GRADUATE NONPYROGENIC DEHP FREE PVC FREE BD MEDICAL (Syringes, Needles) ×1 IMPLANT
SYRINGE MED 50ML LF STRL GRAD N-PYRG (Syringes, Needles) ×1
WATER STERILE PLASTIC POUR BOTTLE 1000 (Irrigation Solutions) ×1
WATER STERILE PLASTIC POUR BOTTLE 1000 ML (Irrigation Solutions) ×1 IMPLANT
WATER STERILE PLASTIC POUR BOTTLE 250 ML (Irrigation Solutions) ×1 IMPLANT
WATER STRL 1000ML LF PLS PR BTL (Irrigation Solutions) ×1
WATER STRL 250ML LF PLS PR BTL (Irrigation Solutions) ×1

## 2017-04-16 NOTE — Plan of Care (Signed)
Patient complaining of right face numbness likely anaesthesia S/E   Will restart ASA   MRI of the brain with and without contrast ordered stat

## 2017-04-16 NOTE — Progress Notes (Addendum)
[facility]  PROGRESS NOTE      Patient: [name] Cook  Date: 04/16/2017   LOS: 1 Days  Admission Date: 04/15/2017   MRN: [id number]  Attending: Jolyn Lent  Please contact me on the following Spectralink [phone]       ASSESSMENT/PLAN     [name] Cook is a 42 y.o. female admitted with Blood loss anemia    Interval Summary:     Patient Active Hospital Problem List:   Blood loss anemia (04/15/2017)   Hematemesis, GI bleeding     NPO   EGD today   Hold ASA   PPI BID   GI input is appreciated      Chest pain (06/17/2015)  Likely gastric in nature   Troponin negative   EKG NSR , normal   Will get Echocardiogram      Seizures (08/20/2015)  Epilepsy   Continue keppra home dose 500 mg BID      History of CVA (cerebrovascular accident) (05/13/2016)  Continue statin   ASA on hold until cleared by GI      HLD (hyperlipidemia) (04/16/2017)  Continue statin     Morbid obesity   Advise wt loss   Nutrition consult       Analgesia: morphine as needed     Nutrition: NPO     DVT Prophylaxis: SCDs   No heparin due to GI bleeding        Code Status: Full code     DISPO: TBD        SUBJECTIVE     [name] Cook states that she still have epigastric pain   No nausea or vomiting     MEDICATIONS     Current Facility-Administered Medications   Medication Dose Route Frequency   . atorvastatin  40 mg Oral QHS   . levETIRAcetam  500 mg Oral Q12H SCH   . lisinopril  40 mg Oral Daily   . pantoprazole  40 mg Intravenous BID       PHYSICAL EXAM     Vitals:    04/16/17 1010   BP: 116/60   Pulse:    Resp:    Temp:    SpO2:        Temperature: Temp  Min: 96.2 F (35.7 C)  Max: 97.7 F (36.5 C)  Pulse: Pulse  Min: 59  Max: 72  Respiratory: Resp  Min: 18  Max: 18  Non-Invasive BP: BP  Min: 102/77  Max: 140/67  Pulse Oximetry SpO2  Min: 90 %  Max: 98 %    Intake and Output Summary (Last 24 hours) at Date Time    Intake/Output Summary (Last 24 hours) at 04/16/17 1016  Last data filed at 04/15/17 1931   Gross per 24 hour   Intake          1588.33 ml    Output                0 ml   Net          1588.33 ml       GEN APPEARANCE: Normal;  A&OX3, obese   HEENT: PERLA; EOMI; Conjunctiva Clear  NECK: Supple; No bruits  CVS: RRR, S1, S2; No M/G/R  LUNGS: CTAB; No Wheezes; No Rhonchi: No rales  ABD: Soft; epigastric tenderness ; + Normoactive BS  EXT: No edema; Pulses 2+ and intact  Skin exam:  pink  NEURO: CN 2-12 intact; No Focal neurological deficits  CAP REFILL:  Normal  MENTAL STATUS:  Normal    Exam done by Jolyn Lent, MD on 04/16/17 at 10:16 AM      LABS       Recent Labs  Lab 04/16/17  0706 04/15/17  1432 04/15/17  0133   WBC 8.07  --  10.37   RBC 3.72*  --  3.98*   Hgb 8.3* 8.5* 8.8*   Hematocrit 28.8* 29.8* 31.1*   MCV 77.4*  --  78.1*   Platelets 192  --  264         Recent Labs  Lab 04/16/17  0532 04/15/17  0133   Sodium 137 140   Potassium 3.8 3.9   Chloride 107 106   CO2 23 27   BUN 9.0 13.0   Creatinine 0.7 0.8   Glucose 87 80   Calcium 8.4* 8.8         Recent Labs  Lab 04/15/17  0133   ALT 12   AST (SGOT) 16   Bilirubin, Total 0.6   Albumin 3.8   Alkaline Phosphatase 86         Recent Labs  Lab 04/15/17  1432 04/15/17  0932 04/15/17  0133   Troponin I <0.01 <0.01 <0.01             Microbiology Results     None           RADIOLOGY     Upon my review:    [name] Cook  10:16 AM 04/16/2017

## 2017-04-16 NOTE — Anesthesia Preprocedure Evaluation (Signed)
Anesthesia Evaluation    AIRWAY    Mallampati: I    TM distance: >3 FB  Neck ROM: full  Mouth Opening:full  Planned to use difficult airway equipment: No CARDIOVASCULAR    regular and normal       DENTAL         PULMONARY    clear to auscultation     OTHER FINDINGS                  Relevant Problems   (+) Seizures   (+) Tension type headache               Anesthesia Plan    ASA 2     MAC                                 informed consent obtained                   Signed by: Alvan Dame 04/16/17 2:32 PM

## 2017-04-16 NOTE — OR Nursing (Signed)
Patient applied bite block in the mouth  without assistant for EGD procedure.

## 2017-04-16 NOTE — UM Notes (Signed)
Primary Payor: MEDICAID HMO/MEDICAID [place] [facility]     Admit to Inpatient (Order [id number]) 04/15/17 0457        42 y.o. female presenting to the ED with intermittent uncomfortable LUQ abd pain with associated nausea, lack of appetite, and vomiting for the past 3 days.She was vomiting forcibly. She had 3 episodes of bright red blood around half cup.  Pt c/o constant CP since yesterday which began after 3 episodes of emesis. Pt notices streaks of blood in her vomit.          95.2 F (35.1 C) Oral  58 95 % -- 18 192/90     Past Medical History:   Diagnosis Date   . Hypercholesteremia    . Hypertension    . Seizures    . Stroke     2015 and 2017   . TIA (transient ischemic attack)      Hgb 8.8 (L) g/dL        Hematocrit 40.9 (L) %      Platelets 264 x10 3/uL      RBC 3.98 (L) x10 6/uL      MCV 78.1 (L) fL      MCH 22.1 (L) pg      MCHC 28.3 (L) g/dL      RDW 17 (H) %      MPV 12.6 (H) fL      Chest AP Portable (Final result)   Result time 04/15/17 02:18:16   Final result by Royetta Car, MD (04/15/17 02:18:16)                Impression:      Mild cardiomegaly with no acute process.                Medication Administration from 04/14/2017 2345 to 04/15/2017 0556     Date/Time Order Dose Route Action Action by Comments   04/15/2017 0350 famotidine (PEPCID) injection 20 mg 20 mg Intravenous Given Liz Malady, RN    04/15/2017 0352 lidocaine viscous (XYLOCAINE) 2 % solution 10 mL 10 mL Mouth/Throat Given Liz Malady, RN    04/15/2017 0352 alum & mag hydroxide-simethicone (MAALOX PLUS) 200-200-20 mg/5 mL suspension 30 mL 30 mL Oral Given Liz Malady, RN    04/15/2017 8119 nitroglycerin (NITRO-BID) 2 % ointment 0.5 inch 0.5 inch Topical Ointment Applied Liz Malady, RN    04/15/2017 1478 morphine injection 4 mg 4 mg Intravenous Given Liz Malady, RN    04/15/2017 0515 ondansetron (ZOFRAN) 4 mg/27ml oral syringe 4 mg  Oral Canceled Entry  Daraseng, Janeann Forehand, RN Automatically canceled at discontinue of medication order   04/15/2017 0526 ondansetron (ZOFRAN) injection 4 mg 4 mg Intravenous Given Daraseng, Rhona Liza, RN      morphine injection 2 mg  Dose: 2 mg  Freq: Every 4 hours PRN Route: IV  PRN Reason: severe pain-X 7 DOSES/24 HRS    H&P:  ASSESSMENT & PLAN         Hematemesis  Blood loss anemia  Likely either mallory weiss tear or PUD/gastritis.   Start PPI IV BID  zofran prn  GI consult  Monitor H/H     Chest pain (06/17/2015)  Likely from above. R/o cardiac. Monitor on tele. Check troponin      Hypertension  Resume lisinopril     Hx of seizure  Resume Keppra      Nutrition  NPO after midnight    DVT/VTE Prophylaxis  scds  ADMIT TO TELE    GI CONSULT:  Assessment:  Epigastric pain  Hematemesis  Microcytic anemia  - presented w/ H/H of 8.8/31.1, which has decreased from 9.9/33.7 on 09/21/2016   - baseline Hgb 8-10    [name] Cook is a 42 y.o. female w/ significant PMHx of TIA and seizures presented to the ER c/o epigastric pain and several episodes of hematemesis for past 3 days. At presentation, is noted to have H/H of 8.8/31.1, which has decreased from 9.9/33.7 on 09/2016. Ddx: PUD, MWT, etc.   ___________________________  Plan:  PPI IV BID  Diet as tolerated currently  NPO at midnight for EGD tomorrow  Further recs pending above      2/4 MD PROG NOTE:  Patient Active Hospital Problem List:   Blood loss anemia (04/15/2017)   Hematemesis, GI bleeding     NPO   EGD today   Hold ASA   PPI BID   GI input is appreciated      Chest pain (06/17/2015)  Likely gastric in nature   Troponin negative   EKG NSR , normal   Will get Echocardiogram      Seizures (08/20/2015)  Epilepsy   Continue keppra home dose 500 mg BID      History of CVA (cerebrovascular accident) (05/13/2016)  Continue statin   ASA on hold until cleared by GI      HLD (hyperlipidemia) (04/16/2017)  Continue statin     Morbid obesity   Advise wt loss   Nutrition consult        Analgesia: morphine as needed     Nutrition: NPO     DVT Prophylaxis: SCDs   No heparin due to GI bleeding        Code Status: Full code     DISPO: TBD        SUBJECTIVE     [name] Cook states that she still have epigastric pain   No nausea or vomiting     MEDICATIONS            Current Facility-Administered Medications   Medication Dose Route Frequency   . atorvastatin  40 mg Oral QHS   . levETIRAcetam  500 mg Oral Q12H SCH   . lisinopril  40 mg Oral Daily   . pantoprazole  40 mg Intravenous BID     Recent Labs  Lab 04/16/17  0706 04/15/17  1432 04/15/17  0133   WBC 8.07  --  10.37   RBC 3.72*  --  3.98*   Hgb 8.3* 8.5* 8.8*   Hematocrit 28.8* 29.8* 31.1*   MCV 77.4*  --  78.1*     Calcium 8.4* 8.8     SUBJECTIVE     [name] Cook states that she still have epigastric pain   No nausea or vomiting       Homero Fellers, RN, BSN, ACM, CCM  Utilization Review Nurse  [facility]  [phone] [facility] Staff ONLY)  [phone] (Carriers)    Please submit all clinical review requests via fax to [phone]

## 2017-04-16 NOTE — Transfer of Care (Signed)
Anesthesia Transfer of Care Note    Patient: [name] Cook    Procedures performed: Procedure(s):  EGD, BIOPSY    Anesthesia type: General TIVA    Patient location:PACU    Last vitals:   Vitals:    04/16/17 1441   BP: 165/79   Pulse: 64   Resp: 18   Temp: 36.9 C (98.5 F)   SpO2: 94%       Post pain: Patient not complaining of pain, continue current therapy      Mental Status:awake    Respiratory Function: tolerating nasal cannula    Cardiovascular: stable    Nausea/Vomiting: patient not complaining of nausea or vomiting    Hydration Status: adequate    Post assessment: no apparent anesthetic complications    Signed by: Naida Sleight  04/16/17 4:05 PM

## 2017-04-16 NOTE — Progress Notes (Signed)
Progress Note  [facility] [id number]    04/16/17      Assessment:  Epigastric/chest pain  - troponin normal, CXR WNL;   Hematemesis  Microcytic anemia  - presented w/ H/H of 8.8/31.1, which has decreased from 9.9/33.7 on 09/21/2016   - baseline Hgb 8-10    [name] Cook is a 42 y.o. female w/ significant PMHx of TIA and seizures presented to the ER c/o epigastric pain and several episodes of hematemesis for past 3 days. At presentation, is noted to have H/H of 8.8/31.1, which has decreased from 9.9/33.7 on 09/2016. Ddx: PUD, MWT, etc.     Plan:  Remain NPO, EGD today  Continue PPI IV BID  Trend H/H daily    This case will be discussed with Dr. Ree Edman.  Toma Copier, PA   8:56 AM    Subjective:  Pt is c/o epigastric/chest pain. No episodes of vomiting overnight or this morning.  No acute events.     Objective:    Current Facility-Administered Medications   Medication Dose Route Frequency Last Rate Last Dose   . atorvastatin (LIPITOR) tablet 40 mg  40 mg Oral QHS   40 mg at 04/15/17 2203   . diphenhydrAMINE (BENADRYL) capsule 25 mg  25 mg Oral Q6H PRN       . levETIRAcetam (KEPPRA) tablet 500 mg  500 mg Oral Q12H SCH   500 mg at 04/15/17 2203   . lisinopril (PRINIVIL,ZESTRIL) tablet 40 mg  40 mg Oral Daily   40 mg at 04/15/17 1710   . morphine injection 2 mg  2 mg Intravenous Q4H PRN   2 mg at 04/16/17 0527   . naloxone [facility]) injection 0.2 mg  0.2 mg Intravenous PRN       . nitroglycerin (NITROSTAT) SL tablet 0.4 mg  0.4 mg Sublingual Q5 Min PRN   0.4 mg at 04/15/17 0924   . ondansetron (ZOFRAN-ODT) disintegrating tablet 4 mg  4 mg Oral Q6H PRN        Or   . ondansetron (ZOFRAN) injection 4 mg  4 mg Intravenous Q6H PRN       . pantoprazole (PROTONIX) injection 40 mg  40 mg Intravenous BID   40 mg at 04/15/17 1710       Physical Exam:  BP 112/64   Pulse 72   Temp (!) 96.9 F (36.1 C) (Oral)   Resp 18   Ht 1.778 m (5\' 10" )   Wt 137.7 kg (303 lb 9.6 oz)   LMP 03/29/2017   SpO2 97%   BMI 43.56 kg/m      General Appearance: NAD, comfortable, AAOx3  Abd: soft, mild epigastric TTP, ND, BS+, no masses appreciated       Recent Labs  Lab 04/16/17  0706 04/15/17  1432 04/15/17  0133   WBC 8.07  --  10.37   Hgb 8.3* 8.5* 8.8*   Hematocrit 28.8* 29.8* 31.1*   Platelets 192  --  264   MCV 77.4*  --  78.1*   Neutrophils  --   --  63.8       Recent Labs  Lab 04/16/17  0532 04/15/17  0133   Sodium 137 140   Potassium 3.8 3.9   Chloride 107 106   CO2 23 27   BUN 9.0 13.0   Creatinine 0.7 0.8   Glucose 87 80   Calcium 8.4* 8.8   Protein, Total  --  8.2   Albumin  --  3.8  AST (SGOT)  --  16   ALT  --  12   Alkaline Phosphatase  --  86   Bilirubin, Total  --  0.6     Glucose:    Recent Labs  Lab 04/16/17  0532 04/15/17  0133   Glucose 87 80

## 2017-04-16 NOTE — Anesthesia Postprocedure Evaluation (Signed)
Anesthesia Post Evaluation    Patient: [name] Cook    Procedure(s):  EGD, BIOPSY    Anesthesia type: MAC    Last Vitals:   Vitals:    04/16/17 1640   BP: 128/76   Pulse: 72   Resp: 13   Temp:    SpO2: 97%       Anesthesia Post Evaluation    Patient location during evaluation: PACU  Patient participation: complete - patient participated  Level of consciousness: awake and alert  Pain management: adequate  Airway patency: patent  Anesthetic complications: no  Cardiovascular status: acceptable  Respiratory status: acceptable  Hydration status: acceptable        Signed by: Alvan Dame, 04/16/2017 5:47 PM

## 2017-04-16 NOTE — Plan of Care (Addendum)
Problem: Pain  Goal: Pain at adequate level as identified by patient  Outcome: Progressing   04/16/17 2000   Goal/Interventions addressed this shift   Pain at adequate level as identified by patient Identify patient comfort function goal;Offer non-pharmacological pain management interventions;Evaluate if patient comfort function goal is met;Assess for risk of opioid induced respiratory depression, including snoring/sleep apnea. Alert healthcare team of risk factors identified.     Pt on IV morphine for pain management. Pt came back from endoscopy and notified the writer that she has been feeling "pulling sensation" at right side of the face and tingling sensation before she got the procedure downstairs at endoscopy. Denies loss of sensation at that side. Pt asked to smile which was symmetrical, A&OX4,  VSS T: 96.6 upon arrival back to the unit. Dr. Wynelle Bourgeois notifed who ordered MRI of the brain and aspirin. Incoming RN given report.

## 2017-04-16 NOTE — H&P (Signed)
GI PRE PROCEDURE NOTE    Date Time: 04/16/17 4:08 PM  Patient Name: [name] Cook  Attending Physician: Jolyn Lent, MD    Proceduralist Comments:   Review of Systems and Past Medical / Surgical History performed: Yes     Indications:Hematemesis    Previous Adverse Reaction to Anesthesia or Sedation (if yes, describe): No    Physical Exam / Laboratory Data (If applicable)   Airway Classification: Per Anesthesiologist    General: Alert and cooperative  Lungs: Lungs clear to auscultation  Cardiac: RRR, normal S1S2.    Abdomen: Soft, non tender. Normal active bowel sounds  Other:     No labs drawn    American Society of Anesthesiologists (ASA) Physical Status Classification:   Per Anesthesiologist    Planned Sedation:   Deep sedation with anesthesia    Attestation:   [name] Cook has been reassessed immediately prior to the procedure and is an appropriate candidate for the planned sedation and procedure. Risks, benefits and alternatives to the planned procedure and sedation have been explained to the patient or guardian:  yes        Signed by: Pershing Proud MD       04/16/2017  4:08 PM

## 2017-04-16 NOTE — Plan of Care (Signed)
Problem: Chest Pain  Goal: Vital signs and cardiac rhythm stable  Outcome: Progressing   04/16/17 0042   Goal/Interventions addressed this shift   Vital signs and cardiac rhythm stable Monitor [name] Cook vital signs/cardiac rhythms;Monitor labs;Assess the need for oxygen therapy and administer as ordered   Placed O2 on during night for comfort in between medication times. Morphine helps with pain management

## 2017-04-17 ENCOUNTER — Encounter: Payer: Self-pay | Admitting: Gastroenterology

## 2017-04-17 ENCOUNTER — Inpatient Hospital Stay: Payer: Medicaid Other

## 2017-04-17 DIAGNOSIS — R079 Chest pain, unspecified: Secondary | ICD-10-CM

## 2017-04-17 DIAGNOSIS — R29810 Facial weakness: Secondary | ICD-10-CM

## 2017-04-17 DIAGNOSIS — K649 Unspecified hemorrhoids: Secondary | ICD-10-CM | POA: Diagnosis present

## 2017-04-17 DIAGNOSIS — R202 Paresthesia of skin: Secondary | ICD-10-CM | POA: Diagnosis present

## 2017-04-17 LAB — CBC AND DIFFERENTIAL
Absolute NRBC: 0 10*3/uL
Basophils Absolute Automated: 0.04 10*3/uL (ref 0.00–0.20)
Basophils Automated: 0.5 %
Eosinophils Absolute Automated: 0.18 10*3/uL (ref 0.00–0.70)
Eosinophils Automated: 2.3 %
Hematocrit: 29.7 % — ABNORMAL LOW (ref 37.0–47.0)
Hgb: 8.5 g/dL — ABNORMAL LOW (ref 12.0–16.0)
Immature Granulocytes Absolute: 0.03 10*3/uL
Immature Granulocytes: 0.4 %
Lymphocytes Absolute Automated: 1.93 10*3/uL (ref 0.50–4.40)
Lymphocytes Automated: 24.3 %
MCH: 22.1 pg — ABNORMAL LOW (ref 28.0–32.0)
MCHC: 28.6 g/dL — ABNORMAL LOW (ref 32.0–36.0)
MCV: 77.3 fL — ABNORMAL LOW (ref 80.0–100.0)
MPV: 13 fL — ABNORMAL HIGH (ref 9.4–12.3)
Monocytes Absolute Automated: 0.37 10*3/uL (ref 0.00–1.20)
Monocytes: 4.7 %
Neutrophils Absolute: 5.39 10*3/uL (ref 1.80–8.10)
Neutrophils: 67.8 %
Nucleated RBC: 0 /100 WBC (ref 0.0–1.0)
Platelets: 225 10*3/uL (ref 140–400)
RBC: 3.84 10*6/uL — ABNORMAL LOW (ref 4.20–5.40)
RDW: 16 % — ABNORMAL HIGH (ref 12–15)
WBC: 7.94 10*3/uL (ref 3.50–10.80)

## 2017-04-17 LAB — HEMOLYSIS INDEX: Hemolysis Index: 1 (ref 0–18)

## 2017-04-17 LAB — BASIC METABOLIC PANEL
Anion Gap: 6 (ref 5.0–15.0)
BUN: 9 mg/dL (ref 7.0–19.0)
CO2: 26 mEq/L (ref 22–29)
Calcium: 8.4 mg/dL — ABNORMAL LOW (ref 8.5–10.5)
Chloride: 106 mEq/L (ref 100–111)
Creatinine: 0.8 mg/dL (ref 0.6–1.0)
Glucose: 98 mg/dL (ref 70–100)
Potassium: 3.5 mEq/L (ref 3.5–5.1)
Sodium: 138 mEq/L (ref 136–145)

## 2017-04-17 LAB — GFR: EGFR: >60

## 2017-04-17 MED ORDER — PANTOPRAZOLE SODIUM 40 MG PO TBEC
40.00 mg | DELAYED_RELEASE_TABLET | Freq: Two times a day (BID) | ORAL | Status: DC
Start: 2017-04-18 — End: 2017-04-17

## 2017-04-17 MED ORDER — PANTOPRAZOLE SODIUM 40 MG PO TBEC
40.00 mg | DELAYED_RELEASE_TABLET | Freq: Every day | ORAL | 0 refills | Status: AC
Start: 2017-04-17 — End: 2017-05-17

## 2017-04-17 MED ORDER — ASPIRIN EC 81 MG PO TBEC
81.00 mg | DELAYED_RELEASE_TABLET | Freq: Every day | ORAL | 0 refills | Status: AC
Start: 2017-04-18 — End: 2017-05-18

## 2017-04-17 NOTE — Consults (Signed)
[facility] Consultation Note                                       Date Time: 04/17/17 9:20 AM  Patient Name: [name] Cook  Requesting Physician: Jolyn Lent, MD  Date of Admission: 04/15/2017    CC / Reason for Consultation: R facial droop/paresthesias            Assessment:   Recurrent R facial droop/paresthesias in a 42 y/o female with a PMH significant for recurrent R facial droop, HTN, HLD, reported stroke, and seizures. The R facial droop on exam is not consistent with a true facial asymmetry (when face at rest appears to be a mild facial droop however when smiling it resolves) Etiology unclear, possibly a hemifacial spasm though more likely this is conversion       Plan:   -MRI brain reviewed  -No need for further inpatient neuro work up  -Contnue home medicaitons  -OK to d/c from a neuro standpoint. F/u with her outpatient neurologist     Attending note:    The patient was seen and examined by me. History was independently reviewed and confirmed by me. All pertinent parts of the neurological exam we performed and confirmed by me, with any additional findings on exam noted in bold. I agree with the assessment and plan as outlined by the mid-level provider as above, with any additional considerations or recommendations as noted:     Today, patient states she still feels like her right face is "pulling."  She states that she no longer has any numbness or paresthesias.  No other areas of weakness or spasm.  On exam, at baseline, it seems there is a slight weakness to the right face, but then when she smiles.  The right face actually elevates more than the left.  Additionally, when she puffs out her cheeks, closes her eyes tightly, or is her eyebrows, all of these are equal.  Therefore, there is no definite weakness here.  Perhaps a pulling could be a hemifacial spasm, variability to the exam.  MRI brain is normal with no evidence of infarct, lesion or mass.  Of note, she has had  multiple MRIs and MRAs for this in the past.  All which have been unremarkable.    Kateri Plummer, MD, PhD  [facility]        HPI   [name] Cook is a 42 y.o. female with a PMH significant for recurrent R facial droop, HTN, HLD, reported stroke who presents to the hospital with abdominal pain and hematemesis. She underwent a EGD and after procedure started complaining of R facial droop and paresthesias prompting neuro evaluation. MRI brain was which negative for acute processes - unchanged from previous (05/2016). Of note, pt has been seen by our group with same complaints in the past (2018, 2017). Per care everywhere has been seen numerous times in other EDs and by neurologist with negative imaging. She takes ASA at home. On keppra for reported hx of seizures in the past. Denies HA, vision or speech changes, dizziness, confusion, flu like symptoms, or N/V.     Past Medical Hx     Past Medical History:   Diagnosis Date   . Hypercholesteremia    . Hypertension    . Seizures    . Stroke     2015 and 2017, right side   . TIA (transient  ischemic attack) 2017          Past Surgical Hx:     Past Surgical History:   Procedure Laterality Date   . APPENDECTOMY     . CHOLECYSTECTOMY     . EGD, BIOPSY N/A 04/16/2017    Procedure: EGD, BIOPSY;  Surgeon: Pershing Proud, MD;  Location: [facility];  Service: Gastroenterology;  Laterality: N/A;        Family Medical History:      Family History   Problem Relation Age of Onset   . Myocardial Infarction Father 63   . Deep vein thrombosis Father        Social Hx     Social History     Social History   . Marital status: Single     Spouse name: N/A   . Number of children: N/A   . Years of education: N/A     Occupational History   . Not on file.     Social History Main Topics   . Smoking status: Never Smoker   . Smokeless tobacco: Never Used   . Alcohol use No   . Drug use: No   . Sexual activity: Not on file     Other Topics Concern   . Not on file     Social History Narrative   .  No narrative on file       Meds     Home :   Prior to Admission medications    Medication Sig Start Date End Date Taking? Authorizing Provider   atorvastatin (LIPITOR) 40 MG tablet Take by mouth.   Yes [provider]   LevETIRAcetam (KEPPRA PO) Take 500 mg by mouth 2 (two) times daily.      Yes [provider]   lidocaine (LIDODERM) 5 % Place 1 patch onto the skin daily. Remove & Discard patch within 12 hours or as directed by MD 06/19/15  Yes Hmaid, Abed K, MD   nitroglycerin (NITROSTAT) 0.4 MG SL tablet Place under the tongue. 10/24/14  Yes [provider]      Inpatient :   Current Facility-Administered Medications   Medication Dose Route Frequency   . aspirin EC  81 mg Oral Daily   . atorvastatin  40 mg Oral QHS   . levETIRAcetam  500 mg Oral Q12H SCH   . lisinopril  40 mg Oral Daily   . pantoprazole  40 mg Intravenous BID         Allergies    Contrast [iodinated diagnostic agents]; Fioricet [butalbital-apap-caffeine]; Iodine; Motrin [ibuprofen]; Percocet [oxycodone-acetaminophen]; Shellfish-derived products; Toradol [ketorolac tromethamine]; Tramadol; and Tylenol [acetaminophen]      Review of Systems   Pertinent items are noted in HPI.  All other systems were reviewed and are negative except for that mentioned in the HPI    Physical Exam:   Temp:  [96.2 F (35.7 C)-98.5 F (36.9 C)] 97.9 F (36.6 C)  Heart Rate:  [50-87] 65  Resp Rate:  [13-18] 18  BP: (106-165)/(52-83) 128/79     Vital Signs:  Reviewed    General: The patient was well developed and well nourished.  No acute distress. Cooperative with the exam  Neck:  No carotid bruits  CVS: RRR  Resp: CTA bilaterally, no retractions  Abd: Soft, nontender  Extremities: no pedal edema, extremities normal in color    Mental Status: The patient is awake, alert and oriented to person, place, and time.  Affect is normal  [employer]  of knowledge appropriate  Recent and remote memory are intact   Attention span and concentration appear  normal.  Language function is normal. There is no evidence of aphasia in conversational speech.    Cranial nerves:   -CN II: Visual fields full to bedside confrontation   -CN III, IV, VI: Pupils equal, round, and reactive to light; extraocular movements intact; no ptosis              -CN V: Facial sensation intact in V1 through V3 distributions   -CN VII: Quesiontable R facial asymmetry at rest, resolves with smiling.   -CN VIII: Hearing intact to conversational speech   -CN IX, X: Palate elevates symmetrically; normal phonation   -CN XI: Symmetric full strength of sternocleidomastoid and trapezius muscles   -CN XII: Tongue protrudes midline    Motor: Muscle tone normal without spasticity or flaccidity. No atrophy.  No pronator drift.  Strength  R / L    R / L  Deltoid  5 / 5  Hip Flexion 5 / 5  Triceps  5 / 5   Hip extension 5 / 5  Biceps  5 / 5   Knee flexion 5 / 5  Wrist ext 5 / 5  Knee ext 5 / 5  Wrist flexion 5 / 5  Dorsiflexion 5 / 5  FF   5 / 5  Plantar flexion 5 / 5    Sensory:   Light touch intact.      Reflexes:  Deferred     Coordination: FTN intact. No tremors    Gait: Deferred     Labs:     Results     Procedure Component Value Units Date/Time    Basic Metabolic Panel [[id number]]  (Abnormal) Collected:  04/17/17 0440    Specimen:  Blood Updated:  04/17/17 0703     Glucose 98 mg/dL      BUN 9.0 mg/dL      Creatinine 0.8 mg/dL      Calcium 8.4 (L) mg/dL      Sodium 308 mEq/L      Potassium 3.5 mEq/L      Chloride 106 mEq/L      CO2 26 mEq/L      Anion Gap 6.0    Hemolysis index [[id number]] Collected:  04/17/17 0440     Updated:  04/17/17 0703     Hemolysis Index 1    GFR [[id number]] Collected:  04/17/17 0440     Updated:  04/17/17 0703     EGFR >60.0    CBC and differential [[id number]]  (Abnormal) Collected:  04/17/17 0440    Specimen:  Blood from Blood Updated:  04/17/17 0636     WBC 7.94 x10 3/uL      Hgb 8.5 (L) g/dL      Hematocrit 66.4 (L) %      Platelets 225 x10 3/uL      RBC 3.84 (L) x10 6/uL       MCV 77.3 (L) fL      MCH 22.1 (L) pg      MCHC 28.6 (L) g/dL      RDW 16 (H) %      MPV 13.0 (H) fL      Neutrophils 67.8 %      Lymphocytes Automated 24.3 %      Monocytes 4.7 %      Eosinophils Automated 2.3 %      Basophils Automated 0.5 %  Immature Granulocyte 0.4 %      Nucleated RBC 0.0 /100 WBC      Neutrophils Absolute 5.39 x10 3/uL      Abs Lymph Automated 1.93 x10 3/uL      Abs Mono Automated 0.37 x10 3/uL      Abs Eos Automated 0.18 x10 3/uL      Absolute Baso Automated 0.04 x10 3/uL      Absolute Immature Granulocyte 0.03 x10 3/uL      Absolute NRBC 0.00 x10 3/uL           Rads:     Results for orders placed or performed during the hospital encounter of 04/15/17   MRI Brain WO Contrast    Narrative    MRI BRAIN WO CONTRAST    Clinical history: TIA OR CVA         Comparison: MRI of the brain without contrast from 05/12/2016.    TECHNIQUE: Multiplanar sequences of the brain were obtained.    Findings: Stable 3 mm focus of increased T2 signal in the left corona  radiata. The ventricular system is midline and nondilated. There is no  evidence of an acute stroke or hemorrhage. The pituitary gland is  contained within the sella and has normal appearance.    There are no focal lesions in the midbrain, pons, medulla or cerebellum.  There is no cerebellar tonsillar ectopia. Normal flow voids are present  in the major intracranial vessels and dural sinuses. Images of the  orbits are unremarkable. There is mucosal thickening of bilateral  sphenoidal sinuses. Minimal mucosal thickening of bilateral mastoids.           Impression    Stable 3 mm focus of increased T2 signal in the left corona radiata.  This is most likely a small focus of gliosis related to prior ischemia  or inflammation.       Joselyn Glassman, MD   04/17/2017 8:40 AM   Results for orders placed or performed during the hospital encounter of 05/11/16   MR Angiogram Head WO Contrast    Narrative    MR ANGIOGRAM HEAD WO CONTRAST    CLINICAL INDICATION:    STROKE    COMPARISON: None    TECHNIQUE: Routine MRA of the brain without contrast with MIP images.    FINDINGS:  The study is mildly degraded by motion artifact. There are  flow artifacts in the imaged distal internal carotid arteries  bilaterally. There is no evidence for focal hemodynamically significant  stenosis of either intracranial internal carotid artery. The anterior,  middle, and posterior cerebral arteries appear unremarkable bilaterally.  The vertebrobasilar system appears unremarkable without focal  hemodynamically significant stenosis. There is no MR evidence to suggest  aneurysm of the circle of Willis. There are bilateral posterior  communicating arteries incidentally noted.        Impression     Unremarkable MRA of the brain.    Sandie Ano, MD   05/13/2016 9:10 AM   CT Head without Contrast    Narrative    CT HEAD    CLINICAL STATEMENT: Facial paralysis. Right upper chest and right lower  shotty weakness.     COMPARISON: 08/18/2015.     TECHNIQUE: Multiple 5 mm thickness axial images were obtained from the  skull base to the vertex without IV contrast administration. Coronal and  sagittal reformatted images were then obtained.    Note that CT scanning at this site utilizes multiple dose reduction  techniques including automatic exposure control, adjustment of the MAS  and/or KVP according to patient size, and use of iterative  reconstruction technique.    FINDINGS: Gray-white matter junction differentiation is appropriate. No  intracranial hemorrhage is identified. The ventricles are appropriate in  size, shape and are midline. There is no intracranial mass, mass-effect  or midline shift. No abnormal intra or extra-axial fluid collections are  identified.     The visualized paranasal sinuses and mastoid air cells are well-aerated.  The osseous calvarium is intact.      Impression    No CT evidence of an acute intracranial abnormality.    Fonnie Mu, MD   05/11/2016 8:17 PM   Results for orders  placed or performed during the hospital encounter of 08/18/15   MRI Brain With / Without Contrast    Narrative    MRI BRAIN W WO CONTRAST  CLINICAL HISTORY:Right facial droop    COMPARISON: None available.    TECHNIQUE: Multiplanar multisequence imaging performed. Following 10 cc  Gadavist axial and coronal T1-weighted images were obtained.     INTERPRETATION: Small hyperintense foci noted in the left frontal  subcortical white matter and the centrum semiovale. No other focal  abnormality.  No area of restricted diffusion identified. No acute  hemorrhage, mass lesion or mass effect. The brainstem, cerebellum and  the craniocervical junction are within normal limits. The contrast  enhancement pattern is normal.      Impression         [No acute process Specifically there is no acute  cortical infarct, mass or evidence of hemorrhage.       Heron Nay, MD   08/19/2015 8:15 AM         Signed by:  Ladoris Gene, FNP-BC  Nurse Practitioner  [facility]  Spectra: [facility] [phone]  [facility] [phone]    *Final recommendations per attending neurologist who will also see patient today and record notes.

## 2017-04-17 NOTE — Progress Notes (Signed)
Pt is admitted with epigastric pain from home.    SW met with pt by the bedside and reviewed hospital SW role.    Pt is alert and oriented x3 and able to take an active role in DCP.    Pt reports being self care and ind prior to admit.    Pt has insurance and a PCP in place for follow up needs.    DCP: home w/ spouse to transport       04/17/17 1146   Patient Type   Within 30 Days of Previous Admission? No   Healthcare Decisions   Interviewed: Patient   Orientation/Decision Making Abilities of Patient Alert and Oriented x3, able to make decisions   Advance Directive Patient has advance directive, copy in chart   Healthcare Agent Appointed No   Prior to admission   Prior level of function Independent with ADLs;Ambulates independently   Type of Residence Private residence   Living Arrangements Spouse/significant other   Dressing Independent   Grooming Independent   Feeding Independent   Bathing Independent   Toileting Independent   Adult Protective Services (APS) involved? No   Discharge Planning   Support Systems Spouse/significant other   Anticipated [place] plan discussed with: Patient   Mode of transportation: Private car (family member)   Consults/Providers   PT Evaluation Needed 2   OT Evalulation Needed 2   Important Message from Medicare Notice   Patient received 1st IMM Letter? n/a   Venita Lick, MSW  [phone]

## 2017-04-17 NOTE — Discharge Instr - AVS First Page (Signed)
[facility] DISCHARGE INSTRUCTIONS     Date of Admission: 04/15/2017    Date of Discharge: 04/17/2017    Discharge Physician: Jolyn Lent    Dear [name] Cook,     Thank you for choosing [facility] for your emergency care needs. We strive to provide EXCELLENT care to you and your family.     In an effort to explain clearly why you were here in the hospital, I've written a very brief summary. I hope that you find it useful. Other details including formal diagnosis, medication changes, follow up appointment recommendations, and access to MyChart for formal medical records can be found in this packet.       You were admitted for vomiting blood Make sure to follow up with our [facility] or [facility] PCP, MD your primary care doctor for follow-up. I cannot stress the importance of follow up enough.    Make sure to bring the following to your doctors appointments:  Medications in their original bottles  Glucometer/blood sugar log (if diabetic)   Weight log (if you have heart failure)    If you are unable to obtain an appointment, unable to obtain newly prescribed medications, or are unclear about any of your discharge instructions please contact me at [phone] (M-F, 8am-3pm) or weekends and after hours via the hospital operator [phone], the hospital case manager, or your primary care physician.    Finally, as your discharging physician, you may be receiving a survey which is regarding my care. I would greatly value and appreciate your feedback as I strive for excellence.     Respectfully yours,    Jolyn Lent

## 2017-04-17 NOTE — Progress Notes (Signed)
Progress Note  [facility] [id number]    04/17/17      Assessment:  Epigastric/chest pain  - troponin normal, CXR WNL  - EGD unremarkable  - sx improving.   Hematemesis:  Unclear etiology, EGD 04/16/17 without any evidence of active/recent bleeding and visually appeared normal.  F/u gastric bx.    Microcytic anemia  - presented w/ H/H of 8.8/31.1, which has decreased from 9.9/33.7 on 09/21/2016   - baseline Hgb 8-10  - H/h stable since admission   - suspect related to heavy menses but would consider colon as outpatient.     Active Hospital Problems    Diagnosis   . HLD (hyperlipidemia)   . Hematemesis   . Blood loss anemia   . History of CVA (cerebrovascular accident)   . Seizures   . Chest pain         [name] Cook a 42 y.o.femalew/ significant PMHx of TIA and seizures presented to the ER c/o epigastric pain and several episodes of hematemesis for past 3 days with a decrease in H/h from baseline on admission.  EGD yesterday unremarkable.     Plan:  1.  Continue PPI bid. Continue to trend daily CBC.  Check iron studies.   2.  Monitor for signs of recurrent GI bleeding.  Ok for ASA/bloodthinners as needed from a GI standpoint.   3.  F/u gastric bx  4.  Regular diet as tolerated once cleared from a speech/swallow standpoint.   5.  Would benefit from outpatient colonoscopy given chronic microcytic anemia   5.  Will d/w Dr. Ree Edman         This case will be discussed with Dr. Dorann Lodge Wallis Vancott, PA   10:33 AM    Subjective:  Patient is c/o right facial numbness. MRI brain with stable 3 mm focus likely a small focus of gliosis related to prior ischemia or inflammation.  Abdominal pain has improved.  EGD yesterday negative, gastric bx pending.  No further signs of GI bleeding.  H/h stable.      Objective:    Current Facility-Administered Medications   Medication Dose Route Frequency Last Rate Last Dose   . aspirin EC tablet 81 mg  81 mg Oral Daily   81 mg at 04/17/17 0945   . atorvastatin (LIPITOR) tablet  40 mg  40 mg Oral QHS   40 mg at 04/15/17 2203   . diphenhydrAMINE (BENADRYL) capsule 25 mg  25 mg Oral Q6H PRN   25 mg at 04/16/17 0928   . lactated ringers infusion   Intravenous Continuous   Stopped at 04/16/17 1557   . levETIRAcetam (KEPPRA) tablet 500 mg  500 mg Oral Q12H SCH   500 mg at 04/17/17 0945   . lisinopril (PRINIVIL,ZESTRIL) tablet 40 mg  40 mg Oral Daily   40 mg at 04/17/17 0945   . morphine injection 2 mg  2 mg Intravenous Q4H PRN   2 mg at 04/17/17 0945   . naloxone [facility]) injection 0.2 mg  0.2 mg Intravenous PRN       . nitroglycerin (NITROSTAT) SL tablet 0.4 mg  0.4 mg Sublingual Q5 Min PRN   0.4 mg at 04/15/17 0924   . ondansetron (ZOFRAN-ODT) disintegrating tablet 4 mg  4 mg Oral Q6H PRN        Or   . ondansetron (ZOFRAN) injection 4 mg  4 mg Intravenous Q6H PRN       . pantoprazole (PROTONIX) injection 40 mg  40 mg Intravenous BID   40 mg at 04/17/17 0945       Physical Exam:  BP 138/65   Pulse 65   Temp 97.9 F (36.6 C) (Oral)   Resp 18   Ht 1.778 m (5\' 10" )   Wt 139.3 kg (307 lb)   LMP 03/29/2017   SpO2 94%   BMI 44.05 kg/m     General Appearance: NAD, comfortable, AAOx3  Abd: soft, NT, ND, BS+, no masses appreciated       Recent Labs  Lab 04/17/17  0440 04/16/17  0706 04/15/17  1432 04/15/17  0133   WBC 7.94 8.07  --  10.37   Hgb 8.5* 8.3* 8.5* 8.8*   Hematocrit 29.7* 28.8* 29.8* 31.1*   Platelets 225 192  --  264   MCV 77.3* 77.4*  --  78.1*   Neutrophils 67.8  --   --  63.8       Recent Labs  Lab 04/17/17  0440 04/16/17  0532 04/15/17  0133   Sodium 138 137 140   Potassium 3.5 3.8 3.9   Chloride 106 107 106   CO2 26 23 27    BUN 9.0 9.0 13.0   Creatinine 0.8 0.7 0.8   Glucose 98 87 80   Calcium 8.4* 8.4* 8.8   Protein, Total  --   --  8.2   Albumin  --   --  3.8   AST (SGOT)  --   --  16   ALT  --   --  12   Alkaline Phosphatase  --   --  86   Bilirubin, Total  --   --  0.6     Glucose:    Recent Labs  Lab 04/17/17  0440 04/16/17  0532 04/15/17  0133   Glucose 98 87 80          Mri Brain Wo Contrast    Result Date: 04/17/2017  Stable 3 mm focus of increased T2 signal in the left corona radiata. This is most likely a small focus of gliosis related to prior ischemia or inflammation.   Joselyn Glassman, MD 04/17/2017 8:40 AM    Chest Ap Portable    Result Date: 04/15/2017  Mild cardiomegaly with no acute process. Jorene Guest, MD 04/15/2017 2:18 AM

## 2017-04-17 NOTE — Progress Notes (Signed)
Patient is discharged per MD Dr. Marvis Moeller and GI disease clearance. Vital signs as documented and cardiac rhythm as captured and recorded on the telemetry monitor. All PIV lines and telemetry box disconnected and discontinued. Patient and family gathered and packed all personal belongings.    Discharge instructions/education on hemoccult monitoring , cardiac and GI care and management including education on home medications provided to patient and family. Patient and family member, Glenice Bow (spouse) verbalized and demonstrated understanding of education and instruction. Patient and family offered questions. Clarifications and explanations including follow up appointment with keeping record of stool description provided in answers to questions. Patient and family thanked the treatment team and expressed appreciation for the service provided by the care team received during this hospitalisation visit. All questions/concerns addressed.    Printed information on diagnoses, new and revised prescriptions, and discharge instructions provided to patient and family. Patient read, understood, agreed, and signed all hardcopy documents. Signed d/c instructions filed in patient chart.    Patient alert and verbally responsive. Patient in street clothing opted ambulation with all personal belongings accompanied by family to patient entrance of facility. Patient scheduled to take private transportation provided by Luisa Hart to return to private residence.

## 2017-04-17 NOTE — Progress Notes (Signed)
Pt is being discharged with no identified needs at this time.  Spouse to transport pt home.    DCP: Home w. No Needs       04/17/17 1150   Discharge Disposition   Patient preference/choice provided? N/A   Physical Discharge Disposition Home   Mode of Transportation Car   Patient/Family/POA notified of transfer plan Yes   Patient agreeable to discharge plan/expected d/c date? Yes   CM Interventions   Follow up appointment scheduled? No   Reason no follow up scheduled? Family to schedule   Multidisciplinary rounds/family meeting before d/c? Yes   Medicare Checklist   Is this a Medicare patient? No    Kendric Sindelar T Salvatrice Morandi,MSW  [phone]

## 2017-04-17 NOTE — SLP Eval Note (Addendum)
[facility]  Speech and Language Therapy Bedside Swallow Evaluation     Patient:  [name] Cook MRN#:  [id number]  Unit:  [facility] 4 NORTH Room/Bed:  V4098/J1914.78    Time of Treatment:    1100  1120          Consult received for [name] Cook for SLP Bedside Swallow Evaluation and Treatment.    Medical Diagnosis: Epigastric pain [R10.13]  Chest pain, unspecified type [R07.9]    History of Present Illness: [name] Cook is a 42 y.o. female admitted on 04/15/2017 with   PMHx of HTN, seizure, TIA who presented with hematemesis  Patient reports he had 3 days of nausea, vomiting, epigastric abdominal pain. She was vomiting forcibly. She had 3 episodes of bright red blood around half cup. Associated with epigastric, pain above sternum  She denies using NSAIDs, aspirin. No cough, chest pain, shortness of breath.  MRI Brain w/o contrast 2/5  TECHNIQUE: Multiplanar sequences of the brain were obtained.    Findings: Stable 3 mm focus of increased T2 signal in the left corona  radiata. The ventricular system is midline and nondilated. There is no  evidence of an acute stroke or hemorrhage. The pituitary gland is  contained within the sella and has normal appearance.    There are no focal lesions in the midbrain, pons, medulla or cerebellum.  There is no cerebellar tonsillar ectopia. Normal flow voids are present  in the major intracranial vessels and dural sinuses. Images of the  orbits are unremarkable. There is mucosal thickening of bilateral  sphenoidal sinuses. Minimal mucosal thickening of bilateral mastoids.         IMPRESSION:       Stable 3 mm focus of increased T2 signal in the left corona radiata.  This is most likely a small focus of gliosis related to prior ischemia  or inflammation.  Patient Active Problem List   Diagnosis   . Chest pain   . Precordial pain   . Stroke-like symptoms   . Seizures   . Facial droop   . Tension type headache   . History of CVA (cerebrovascular  accident)   . Morbid obesity   . Hematemesis   . Blood loss anemia   . HLD (hyperlipidemia)   . Numbness and tingling   . Hemorrhoids        Past Medical/Surgical History:  Past Medical History:   Diagnosis Date   . Hypercholesteremia    . Hypertension    . Seizures    . Stroke     2015 and 2017, right side   . TIA (transient ischemic attack) 2017      Past Surgical History:   Procedure Laterality Date   . APPENDECTOMY     . CHOLECYSTECTOMY     . EGD, BIOPSY N/A 04/16/2017    Procedure: EGD, BIOPSY;  Surgeon: Pershing Proud, MD;  Location: [facility];  Service: Gastroenterology;  Laterality: N/A;         History/Current Status:  History/Current Status  Respiratory Status: O2 via nasal cannula  Behavior/Mental Status: Awake/alert, Able to follow directions, Cooperative  Nutrition: oral  Diet Prior to Study: regular, thin liquids    Subjective: Patient is agreeable to participation in the therapy session. Nursing clears patient for therapy. Patient's medical condition is appropriate for Speech therapy intervention at this time.    Objective:  Observation of Patient/Vital Signs:  Patient is in bed with telemetry in place.    Oral  Motor Skills:  Engineer, maintenance (IT) Skills: exceptions to [facility]  Oral Motor Impairments: coordination, strength    Deglutition Skills:  Deglutition Skills  Position: upright 90 degrees  Food(s) Tested: thin liquid, puree, solid  Oral Stage: AP propulsion reduced, chewing reduced, slow but effective  Pharyngeal Stage: delayed response    Assessment:     Oral motor exam revealed right sided facial droop with mild decrease in strength. Pt also had flat affect and appeared fatigue during the assessment. Pt is awake and alert. Swallow is [facility] for all consistencies, thin liquids, purees and solids. Adequate timing and laryngeal elevation noted. No cough, change in vocal quality or other s/s of aspiration observed. Mastication is slow, yet effective for solids. Min cues to use double  swallows and liquid assists to clear min oral residuals and increase swallow safety. Pt reports no difficulty with chewing or swallowing.  Goals:  Patient will adhere to swallow safety precautions 100% of the time during meals.  Pt will demonstrate adequate oral phase in order to safely manage regular texture  and thin liquids for 3 meals /day without overt s/s aspiration and use of min cues for comp swallowing strategies.         Plan/Recommendations:     Follow up treatments: diet tolerance monitoring, strategies training     Diet Solids Recommendation: regular  Diet Liquids Recommendations: thin consistency  Precautions/Compensations: Awake/alert, Upright 90 degrees for all oral intake, 45 degrees upright after meals, Alternate solids and liquids, Swallow multiple times per bite/sip, Small bites/sips, Eat/feed slowly  Recommendation Discussed With: : Patient, Nurse  Administration of Medications: PO  Aspiration Precautions posted at bedside: d/w patient and RN    04/17/2017  [name] MS [facility] SLP   04/17/2017 2:11 PM  [phone]

## 2017-04-17 NOTE — Discharge Summary (Signed)
[facility]      Patient: [name] Cook  Admission Date: 04/15/2017   DOB: 1975/05/20  Discharge Date: 04/17/2017    MRN: [id number]  Discharge Attending:Kyal Arts T Hanley Rispoli     Referring Physician: Bobby Rumpf, MD  PCP: Bobby Rumpf, MD       DISCHARGE SUMMARY     Discharge Information   Admission Diagnosis:   Hematemesis    Discharge Diagnosis:   Active Hospital Problems    Diagnosis   . Numbness and tingling   . Hemorrhoids   . HLD (hyperlipidemia)   . Hematemesis   . Blood loss anemia   . History of CVA (cerebrovascular accident)   . Seizures   . Chest pain        Admission Condition: poor   Discharge Condition: stable   Consultants: neurology, GI   Functional Status: at baseline   Discharged to: Home/ self care     Discharge Medications:     Medication List      START taking these medications    aspirin 81 MG EC tablet  Take 1 tablet (81 mg total) by mouth daily.  Start taking on:  04/18/2017     pantoprazole 40 MG tablet  Commonly known as:  PROTONIX  Take 1 tablet (40 mg total) by mouth daily.        CONTINUE taking these medications    atorvastatin 40 MG tablet  Commonly known as:  LIPITOR     KEPPRA PO     lidocaine 5 %  Commonly known as:  LIDODERM  Place 1 patch onto the skin daily. Remove & Discard patch within 12 hours or as directed by MD     lisinopril 40 MG tablet  Commonly known as:  PRINIVIL,ZESTRIL     NITROSTAT 0.4 MG SL tablet  Generic drug:  nitroglycerin           Where to Get Your Medications      You can get these medications from any pharmacy    Bring a paper prescription for each of these medications   aspirin 81 MG EC tablet   pantoprazole 40 MG tablet             Hospital Course   Presentation History     [name] Cook is a 42 y.o. female with a PMHx of HTN, seizure, TIA who presented with hematemesis  Patient reports he had 3 days of nausea, vomiting, epigastric abdominal pain. She was vomiting forcibly. She had 3 episodes of bright red blood around half cup.  Associated with epigastric, pain above sternum  She denies using NSAIDs, aspirin. No cough, chest pain, shortness of breath.  See HPI for details.    Hospital Course (2 Days)     Blood loss anemia (04/15/2017)  Hematemesis, GI bleeding   Holding ASA  Started on PPI BID   GI input is appreciated   EGD done normal no finding to suggest etiology of hematemesis   Patient advised to follow biopsy   Had an episode of blood streak outside the stool patient educated that it is due to hemorrhoid likely and she will need to follow with GI for further workup if persists \    Patient had paraesthesia of the face   MRI of the brain done negative for stroke   She will be discharged home   Neurology reviewed her condition and no indication for further workup as inpatient      Chest pain (06/17/2015)  Likely gastric  in nature   Troponin negative   EKG NSR , normal    Echocardiogram normal      Seizures (08/20/2015)  Epilepsy   Continue keppra home dose 500 mg BID      History of CVA (cerebrovascular accident) (05/13/2016)  Continue statin   ASA restarted      HLD (hyperlipidemia) (04/16/2017)  Continue statin     Morbid obesity   Advise wt loss   Nutrition consult           Procedures/Imaging:   Upon my review:   Mri Brain Wo Contrast    Result Date: 04/17/2017  Stable 3 mm focus of increased T2 signal in the left corona radiata. This is most likely a small focus of gliosis related to prior ischemia or inflammation.   Joselyn Glassman, MD 04/17/2017 8:40 AM    Chest Ap Portable    Result Date: 04/15/2017  Mild cardiomegaly with no acute process. Jorene Guest, MD 04/15/2017 2:18 AM         Best Practices   Was the patient admitted with either a CHF Exacerbation or Pneumonia? No      Progress Note/Physical Exam at Discharge     Subjective: feeling better denies CP,SOB, headache, nausea, vomiting     Vitals:    04/17/17 0436 04/17/17 0700 04/17/17 0945 04/17/17 1130   BP: 141/82 128/79 138/65 118/66   Pulse: 67 65  74   Resp: 16 18  18    Temp: 97.6 F  (36.4 C) 97.9 F (36.6 C)  97.5 F (36.4 C)   TempSrc: Oral Oral  Oral   SpO2: 97% 94%  94%   Weight: 139.3 kg (307 lb)      Height:           General: NAD, AAOx3, obese   HEENT: perrla, eomi, sclera anicteric, OP: Clear, MMM  Neck: supple, FROM, no LAD  Cardiovascular: RRR, no m/r/g  Lungs: CTAB, no w/r/r  Abdomen: soft, +BS, NT/ND, no masses, no g/r  Extremities: no C/C/E  Skin: no rashes or lesions noted  Neuro: CN 2-12 intact; No Focal neurological deficits       Diagnostics     Labs/Studies Pending at Discharge: Yes, stomach biopsy     Last Labs     Recent Labs  Lab 04/17/17  0440 04/16/17  0706 04/15/17  1432 04/15/17  0133   WBC 7.94 8.07  --  10.37   RBC 3.84* 3.72*  --  3.98*   Hgb 8.5* 8.3* 8.5* 8.8*   Hematocrit 29.7* 28.8* 29.8* 31.1*   MCV 77.3* 77.4*  --  78.1*   Platelets 225 192  --  264         Recent Labs  Lab 04/17/17  0440 04/16/17  0532 04/15/17  0133   Sodium 138 137 140   Potassium 3.5 3.8 3.9   Chloride 106 107 106   CO2 26 23 27    BUN 9.0 9.0 13.0   Creatinine 0.8 0.7 0.8   Glucose 98 87 80   Calcium 8.4* 8.4* 8.8       Microbiology Results     None           Patient Instructions   Discharge Diet: regular diet   Discharge Activity: as tolerated     Follow Up Appointment:  Follow-up Information     Pcp, [facility], MD Follow up.           Charlynn Court, MD. Schedule  an appointment as soon as possible for a visit in 2 week(s).    Specialty:  Neurology  Contact information:  [address]  [place] [address]  [phone]             Pershing Proud, MD. Schedule an appointment as soon as possible for a visit in 2 week(s).    Specialties:  Gastroenterology, Internal Medicine  Why:  to follow biopsy result   Contact information:  [address]  [place] [address]  [phone]                    Time spent examining patient, discussing with patient/family regarding hospital course, chart review, reconciling medications and discharge planning: 45  minutes.    Terrace Arabia    11:42 AM 04/17/2017

## 2017-04-17 NOTE — Plan of Care (Signed)
The learning ability of patient had been assessed. Individualized plan of care on cardiac and gastrointestinal care and management provided to and discussed with the patient during shift and prior discharge. Patient active, participative, cooperative, and compliant to treatment and plan of care. Patient responded and tolerated well to information and education on health promotion and wellness, and illness prevention. Patient demonstrated understanding of disease process, treatment plan, medications and consequences of noncompliance. Questions and concerns offered and addressed accordingly.

## 2017-04-17 NOTE — Plan of Care (Signed)
Problem: Every Day - Stroke  Goal: Neurological status is stable or improving  Outcome: Progressing   04/17/17 0146   Goal/Interventions addressed this shift   Neurological status is stable or improving Monitor/assess/document neurological assessment (Stroke: every 4 hours);Monitor/assess NIH Stroke Scale;Re-assess NIH Stroke Scale for any change in status;Observe for seizure activity and initiate seizure precautions if indicated   Alert/Oriented x4, SR on the monitor,complained of right facial numbness, moves all extremities, MRI of the brain done, dysphagia screening done, pt is NPO, Dr. Oran Rein was paged regarding the protocol .will continue neurocheck q4hrs.

## 2017-04-18 LAB — LAB USE ONLY - HISTORICAL SURGICAL PATHOLOGY

## 2017-04-19 LAB — ECG 12-LEAD
Atrial Rate: 71 {beats}/min
P Axis: 66 degrees
P-R Interval: 146 ms
Q-T Interval: 414 ms
QRS Duration: 90 ms
QTC Calculation (Bezet): 449 ms
R Axis: 14 degrees
T Axis: 2 degrees
Ventricular Rate: 71 {beats}/min

## 2017-09-15 ENCOUNTER — Emergency Department: Payer: Medicaid Other

## 2017-09-15 ENCOUNTER — Observation Stay
Admission: EM | Admit: 2017-09-15 | Discharge: 2017-09-20 | Disposition: A | Discharge status: Home / Self Care | Payer: Medicaid Other | Attending: Student in an Organized Health Care Education/Training Program | Admitting: Student in an Organized Health Care Education/Training Program

## 2017-09-15 DIAGNOSIS — G40909 Epilepsy, unspecified, not intractable, without status epilepticus: Secondary | ICD-10-CM | POA: Insufficient documentation

## 2017-09-15 DIAGNOSIS — R531 Weakness: Principal | ICD-10-CM | POA: Insufficient documentation

## 2017-09-15 DIAGNOSIS — M545 Low back pain: Secondary | ICD-10-CM | POA: Insufficient documentation

## 2017-09-15 DIAGNOSIS — R29898 Other symptoms and signs involving the musculoskeletal system: Secondary | ICD-10-CM

## 2017-09-15 DIAGNOSIS — Z8673 Personal history of transient ischemic attack (TIA), and cerebral infarction without residual deficits: Secondary | ICD-10-CM | POA: Insufficient documentation

## 2017-09-15 DIAGNOSIS — R0789 Other chest pain: Secondary | ICD-10-CM

## 2017-09-15 DIAGNOSIS — E7800 Pure hypercholesterolemia, unspecified: Secondary | ICD-10-CM | POA: Insufficient documentation

## 2017-09-15 DIAGNOSIS — I10 Essential (primary) hypertension: Secondary | ICD-10-CM | POA: Insufficient documentation

## 2017-09-15 DIAGNOSIS — R079 Chest pain, unspecified: Secondary | ICD-10-CM | POA: Insufficient documentation

## 2017-09-15 DIAGNOSIS — J9811 Atelectasis: Secondary | ICD-10-CM | POA: Insufficient documentation

## 2017-09-15 DIAGNOSIS — G8191 Hemiplegia, unspecified affecting right dominant side: Secondary | ICD-10-CM | POA: Diagnosis present

## 2017-09-15 LAB — CBC AND DIFFERENTIAL
Absolute NRBC: 0 10*3/uL (ref 0.00–0.00)
Basophils Absolute Automated: 0.07 10*3/uL (ref 0.00–0.08)
Basophils Automated: 0.8 %
Eosinophils Absolute Automated: 0.18 10*3/uL (ref 0.00–0.44)
Eosinophils Automated: 2.2 %
Hematocrit: 31.6 % — ABNORMAL LOW (ref 34.7–43.7)
Hgb: 9 g/dL — ABNORMAL LOW (ref 11.4–14.8)
Immature Granulocytes Absolute: 0.02 10*3/uL (ref 0.00–0.07)
Immature Granulocytes: 0.2 %
Lymphocytes Absolute Automated: 2.32 10*3/uL (ref 0.42–3.22)
Lymphocytes Automated: 28 %
MCH: 22.2 pg — ABNORMAL LOW (ref 25.1–33.5)
MCHC: 28.5 g/dL — ABNORMAL LOW (ref 31.5–35.8)
MCV: 78 fL (ref 78.0–96.0)
Monocytes Absolute Automated: 0.56 10*3/uL (ref 0.21–0.85)
Monocytes: 6.8 %
Neutrophils Absolute: 5.14 10*3/uL (ref 1.10–6.33)
Neutrophils: 62 %
Nucleated RBC: 0 /100 WBC (ref 0.0–0.0)
Platelets: 213 10*3/uL (ref 142–346)
RBC: 4.05 10*6/uL (ref 3.90–5.10)
RDW: 17 % — ABNORMAL HIGH (ref 11–15)
WBC: 8.29 10*3/uL (ref 3.10–9.50)

## 2017-09-15 LAB — COMPREHENSIVE METABOLIC PANEL
ALT: 16 U/L (ref 0–55)
AST (SGOT): 14 U/L (ref 5–34)
Albumin/Globulin Ratio: 1 (ref 0.9–2.2)
Albumin: 3.9 g/dL (ref 3.5–5.0)
Alkaline Phosphatase: 88 U/L (ref 37–106)
Anion Gap: 7 (ref 5.0–15.0)
BUN: 12 mg/dL (ref 7.0–19.0)
Bilirubin, Total: 0.7 mg/dL (ref 0.2–1.2)
CO2: 27 mEq/L (ref 22–29)
Calcium: 9 mg/dL (ref 8.5–10.5)
Chloride: 107 mEq/L (ref 100–111)
Creatinine: 0.9 mg/dL (ref 0.6–1.0)
Globulin: 4 g/dL — ABNORMAL HIGH (ref 2.0–3.6)
Glucose: 84 mg/dL (ref 70–100)
Potassium: 3.5 mEq/L (ref 3.5–5.1)
Protein, Total: 7.9 g/dL (ref 6.0–8.3)
Sodium: 141 mEq/L (ref 136–145)

## 2017-09-15 LAB — GLUCOSE WHOLE BLOOD - POCT: Whole Blood Glucose POCT: 93 mg/dL (ref 70–100)

## 2017-09-15 LAB — GFR: EGFR: >60

## 2017-09-15 LAB — TROPONIN I
Troponin I: <0.01 ng/mL (ref 0.00–0.09)
Troponin I: <0.01 ng/mL (ref 0.00–0.09)

## 2017-09-15 LAB — APTT: PTT: 32 s (ref 23–37)

## 2017-09-15 LAB — PT/INR
PT INR: 1 (ref 0.9–1.1)
PT: 13.2 s (ref 12.6–15.0)

## 2017-09-15 LAB — HEMOLYSIS INDEX: Hemolysis Index: 4 (ref 0–18)

## 2017-09-15 LAB — URINE BHCG POC: Urine bHCG POC: NEGATIVE

## 2017-09-15 MED ORDER — CYCLOBENZAPRINE HCL 10 MG PO TABS
10.00 mg | ORAL_TABLET | Freq: Three times a day (TID) | ORAL | Status: DC | PRN
Start: 2017-09-15 — End: 2017-09-20
  Administered 2017-09-16 – 2017-09-17 (×2): 10 mg via ORAL
  Filled 2017-09-15 (×4): qty 1

## 2017-09-15 MED ORDER — ATORVASTATIN CALCIUM 40 MG PO TABS
40.00 mg | ORAL_TABLET | Freq: Every evening | ORAL | Status: DC
Start: 2017-09-15 — End: 2017-09-20
  Administered 2017-09-15 – 2017-09-19 (×5): 40 mg via ORAL
  Filled 2017-09-15 (×5): qty 1

## 2017-09-15 MED ORDER — ACETAMINOPHEN 325 MG PO TABS
650.00 mg | ORAL_TABLET | Freq: Once | ORAL | Status: DC | PRN
Start: 2017-09-15 — End: 2017-09-17

## 2017-09-15 MED ORDER — LEVETIRACETAM 500 MG PO TABS
500.00 mg | ORAL_TABLET | Freq: Two times a day (BID) | ORAL | Status: DC
Start: 2017-09-15 — End: 2017-09-20
  Administered 2017-09-15 – 2017-09-20 (×10): 500 mg via ORAL
  Filled 2017-09-15 (×10): qty 1

## 2017-09-15 MED ORDER — ASPIRIN 325 MG PO TABS
325.00 mg | ORAL_TABLET | Freq: Once | ORAL | Status: AC
Start: 2017-09-15 — End: 2017-09-15
  Administered 2017-09-15: 18:00:00 325 mg via ORAL
  Filled 2017-09-15: qty 1

## 2017-09-15 MED ORDER — ONDANSETRON HCL 4 MG/2ML IJ SOLN
4.00 mg | Freq: Three times a day (TID) | INTRAMUSCULAR | Status: AC | PRN
Start: 2017-09-15 — End: 2017-09-16
  Administered 2017-09-16: 10:00:00 4 mg via INTRAVENOUS
  Filled 2017-09-15 (×2): qty 2

## 2017-09-15 MED ORDER — NITROGLYCERIN 0.4 MG SL SUBL
0.40 mg | SUBLINGUAL_TABLET | SUBLINGUAL | Status: DC | PRN
Start: 2017-09-15 — End: 2017-09-20

## 2017-09-15 MED ORDER — LABETALOL HCL 5 MG/ML IV SOLN (WRAP)
10.00 mg | INTRAVENOUS | Status: DC | PRN
Start: 2017-09-15 — End: 2017-09-20

## 2017-09-15 MED ORDER — MORPHINE SULFATE 4 MG/ML IJ/IV SOLN (WRAP)
4.0000 mg | Status: AC | PRN
Start: 2017-09-15 — End: 2017-09-16
  Administered 2017-09-15 – 2017-09-16 (×4): 4 mg via INTRAVENOUS
  Filled 2017-09-15 (×4): qty 1

## 2017-09-15 MED ORDER — MORPHINE SULFATE 4 MG/ML IJ/IV SOLN (WRAP)
4.0000 mg | Freq: Once | Status: AC
Start: 2017-09-15 — End: 2017-09-15
  Administered 2017-09-15: 15:00:00 4 mg via INTRAVENOUS
  Filled 2017-09-15: qty 1

## 2017-09-15 MED ORDER — LORAZEPAM 2 MG/ML IJ SOLN
1.00 mg | Freq: Once | INTRAMUSCULAR | Status: AC
Start: 2017-09-15 — End: 2017-09-15
  Administered 2017-09-15: 18:00:00 1 mg via INTRAVENOUS
  Filled 2017-09-15: qty 1

## 2017-09-15 MED ORDER — ASPIRIN 325 MG PO TABS
325.00 mg | ORAL_TABLET | Freq: Once | ORAL | Status: DC
Start: 2017-09-15 — End: 2017-09-15

## 2017-09-15 MED ORDER — ONDANSETRON HCL 4 MG/2ML IJ SOLN
4.00 mg | Freq: Once | INTRAMUSCULAR | Status: AC | PRN
Start: 2017-09-15 — End: 2017-09-15
  Administered 2017-09-15: 4 mg via INTRAVENOUS
  Filled 2017-09-15: qty 2

## 2017-09-15 MED ORDER — MORPHINE SULFATE 2 MG/ML IJ/IV SOLN (WRAP)
2.0000 mg | Freq: Once | Status: AC
Start: 2017-09-15 — End: 2017-09-15
  Administered 2017-09-15: 18:00:00 2 mg via INTRAVENOUS
  Filled 2017-09-15: qty 1

## 2017-09-15 MED ORDER — HYDRALAZINE HCL 20 MG/ML IJ SOLN
10.00 mg | INTRAMUSCULAR | Status: DC | PRN
Start: 2017-09-15 — End: 2017-09-20

## 2017-09-15 MED ORDER — LISINOPRIL 20 MG PO TABS
40.00 mg | ORAL_TABLET | Freq: Every day | ORAL | Status: DC
Start: 2017-09-15 — End: 2017-09-20
  Administered 2017-09-16 – 2017-09-19 (×4): 40 mg via ORAL
  Filled 2017-09-15 (×4): qty 2

## 2017-09-15 MED ORDER — ENOXAPARIN SODIUM 40 MG/0.4ML SC SOLN
40.00 mg | Freq: Every evening | SUBCUTANEOUS | Status: DC
Start: 2017-09-15 — End: 2017-09-20
  Administered 2017-09-15 – 2017-09-19 (×5): 40 mg via SUBCUTANEOUS
  Filled 2017-09-15 (×5): qty 0.4

## 2017-09-15 MED ORDER — ACETAMINOPHEN 325 MG PO TABS
650.0000 mg | ORAL_TABLET | Freq: Four times a day (QID) | ORAL | Status: AC | PRN
Start: 2017-09-15 — End: 2017-09-16

## 2017-09-15 MED ORDER — ASPIRIN 81 MG PO CHEW
81.00 mg | CHEWABLE_TABLET | Freq: Every day | ORAL | Status: DC
Start: 2017-09-16 — End: 2017-09-20
  Administered 2017-09-16 – 2017-09-20 (×5): 81 mg via ORAL
  Filled 2017-09-15 (×6): qty 1

## 2017-09-15 NOTE — H&P (Signed)
[facility]      Patient: [name] Cook  Date: 09/15/2017   DOB: [birth year]  Admission Date: 09/15/2017   MRN: [id number]  Attending: Georgette Dover MD       Chief Complaint   Patient presents with   . Chest Pain   . Dizziness   . Back Pain      History Gathered From: Patient    HISTORY AND PHYSICAL     [name] Cook is a 42 y.o. female with a PMHx of hypertension, hyperlipidemia, history of CVA/TIA, seizure disorder who presented with left-sided chest pain and lower back pain since yesterday.  Patient reported that she started to have sudden onset of sharp radicular 7/10 constant lower back pain associated with transient numbness of the right leg and weakness of the right arm.  Patient also had sharp, constant, 6/10 left-sided chest pain not associated with shortness of breath.  The pain became intense since this morning for which she came to the ED. no similar chest pain in the past.  Patient had an abnormal stress test in 2014 but developed anaphylactic reaction during LHC.  She denied headache, blurring of vision, nausea, vomiting, ataxia    Past Medical History:   Diagnosis Date   . Hypercholesteremia    . Hypertension    . Seizures    . Stroke     2015 and 2017, right side   . TIA (transient ischemic attack) 2017       Past Surgical History:   Procedure Laterality Date   . APPENDECTOMY     . CHOLECYSTECTOMY     . EGD, BIOPSY N/A 04/16/2017    Procedure: EGD, BIOPSY;  Surgeon: Pershing Proud, MD;  Location: [facility];  Service: Gastroenterology;  Laterality: N/A;       Prior to Admission medications    Medication Sig Start Date End Date Taking? Authorizing Provider   atorvastatin (LIPITOR) 40 MG tablet Take by mouth.    [provider]   LevETIRAcetam (KEPPRA PO) Take 500 mg by mouth 2 (two) times daily.       [provider]   lidocaine (LIDODERM) 5 % Place 1 patch onto the skin daily. Remove & Discard patch within 12 hours or as directed by MD 06/19/15   Hmaid, Abed K, MD    lisinopril (PRINIVIL,ZESTRIL) 40 MG tablet Take 40 mg by mouth daily.    [provider]   nitroglycerin (NITROSTAT) 0.4 MG SL tablet Place under the tongue. 10/24/14   [provider]       Allergies   Allergen Reactions   . Contrast [Iodinated Diagnostic Agents] Anaphylaxis     Patient woke up in ICU after having CT with contrast, last remembers being in CT   . Fioricet [Butalbital-Apap-Caffeine] Hives   . Iodine    . Motrin [Ibuprofen] Swelling   . Percocet [Oxycodone-Acetaminophen]    . Shellfish-Derived Products    . Toradol [Ketorolac Tromethamine]    . Tramadol    . Tylenol [Acetaminophen] Hives       CODE STATUS: full    PRIMARY CARE MD: Margurite Auerbach, MD    Family History   Problem Relation Age of Onset   . Myocardial Infarction Father 69   . Deep vein thrombosis Father        Social History   Substance Use Topics   . Smoking status: Never Smoker   . Smokeless tobacco: Never Used   . Alcohol use No  REVIEW OF SYSTEMS   12 point review of systems was done and found to be negative except the one mentioned in HPI  PHYSICAL EXAM     Vital Signs (most recent): BP 162/75   Pulse 69   Temp 97.6 F (36.4 C) (Oral)   Resp 18   Ht 1.778 m (5\' 10" )   Wt 121.1 kg (267 lb)   LMP 09/13/2017   SpO2 100%   BMI 38.31 kg/m   Constitutional:  Patient speaks freely in full sentences.   HEENT: NC/AT, PERRL, no scleral icterus or conjunctival pallor, no nasal discharge, MMM, oropharynx without erythema or exudate  Neck: trachea midline, supple, no cervical or supraclavicular lymphadenopathy or masses  Cardiovascular: RRR, normal S1 S2, no murmurs, gallops, palpable thrills, no JVD, Non-displaced PMI.  Respiratory: Normal rate. No retractions or increased work of breathing. Clear to auscultation and percussion bilaterally.  Gastrointestinal: +BS, non-distended, soft, non-tender, no rebound or guarding, no hepatosplenomegaly  Genitourinary: no suprapubic or costovertebral angle  tenderness  Musculoskeletal: ROM and motor strength grossly normal.  Significant mid spinal lumbar tenderness no clubbing, edema, or cyanosis. DP and radial pulses 2+ and symmetric.  Skin: no rashes, jaundice or other lesions  Neurologic: EOMI, CN 2-12 grossly intact. no gross motor or sensory deficits  Psychiatric: AAOx3, affect and mood appropriate. The patient is alert, interactive, appropriate.    LABS & IMAGING     Recent Results (from the past 24 hour(s))   CBC with differential    Collection Time: 09/15/17  2:30 PM   Result Value Ref Range    WBC 8.29 3.10 - 9.50 x10 3/uL    Hgb 9.0 (L) 11.4 - 14.8 g/dL    Hematocrit 16.1 (L) 34.7 - 43.7 %    Platelets 213 142 - 346 x10 3/uL    RBC 4.05 3.90 - 5.10 x10 6/uL    MCV 78.0 78.0 - 96.0 fL    MCH 22.2 (L) 25.1 - 33.5 pg    MCHC 28.5 (L) 31.5 - 35.8 g/dL    RDW 17 (H) 11 - 15 %    MPV Unmeasured 8.9 - 12.5 fL    Neutrophils 62.0 None %    Lymphocytes Automated 28.0 None %    Monocytes 6.8 None %    Eosinophils Automated 2.2 None %    Basophils Automated 0.8 None %    Immature Granulocyte 0.2 None %    Nucleated RBC 0.0 0.0 - 0.0 /100 WBC    Neutrophils Absolute 5.14 1.10 - 6.33 x10 3/uL    Abs Lymph Automated 2.32 0.42 - 3.22 x10 3/uL    Abs Mono Automated 0.56 0.21 - 0.85 x10 3/uL    Abs Eos Automated 0.18 0.00 - 0.44 x10 3/uL    Absolute Baso Automated 0.07 0.00 - 0.08 x10 3/uL    Absolute Immature Granulocyte 0.02 0.00 - 0.07 x10 3/uL    Absolute NRBC 0.00 0.00 - 0.00 x10 3/uL   Comprehensive metabolic panel    Collection Time: 09/15/17  2:30 PM   Result Value Ref Range    Glucose 84 70 - 100 mg/dL    BUN 09.6 7.0 - 04.5 mg/dL    Creatinine 0.9 0.6 - 1.0 mg/dL    Sodium 409 811 - 914 mEq/L    Potassium 3.5 3.5 - 5.1 mEq/L    Chloride 107 100 - 111 mEq/L    CO2 27 22 - 29 mEq/L    Calcium 9.0 8.5 - 10.5 mg/dL  Protein, Total 7.9 6.0 - 8.3 g/dL    Albumin 3.9 3.5 - 5.0 g/dL    AST (SGOT) 14 5 - 34 U/L    ALT 16 0 - 55 U/L    Alkaline Phosphatase 88 37 - 106 U/L     Bilirubin, Total 0.7 0.2 - 1.2 mg/dL    Globulin 4.0 (H) 2.0 - 3.6 g/dL    Albumin/Globulin Ratio 1.0 0.9 - 2.2    Anion Gap 7.0 5.0 - 15.0   Troponin I    Collection Time: 09/15/17  2:30 PM   Result Value Ref Range    Troponin I <0.01 0.00 - 0.09 ng/mL   Hemolysis index    Collection Time: 09/15/17  2:30 PM   Result Value Ref Range    Hemolysis Index 4 0 - 18   GFR    Collection Time: 09/15/17  2:30 PM   Result Value Ref Range    EGFR >60.0    APTT    Collection Time: 09/15/17  2:30 PM   Result Value Ref Range    PTT 32 23 - 37 sec   Protime - INR    Collection Time: 09/15/17  2:30 PM   Result Value Ref Range    PT 13.2 12.6 - 15.0 sec    PT INR 1.0 0.9 - 1.1   Glucose Whole Blood - POCT    Collection Time: 09/15/17  3:04 PM   Result Value Ref Range    POCT - Glucose Whole blood 93 70 - 100 mg/dL   Urine BHCG POC    Collection Time: 09/15/17  3:08 PM   Result Value Ref Range    Urine bHCG POC Negative Negative         IMAGING:  CT head without contrast unremarkable    CARDIAC:  EKG Interpretation (upon my review):  NSR    Markers:    Recent Labs  Lab 09/15/17  1430   Troponin I <0.01       EMERGENCY DEPARTMENT COURSE:  Orders Placed This Encounter   Procedures   . XR Chest  AP Portable   . CT Head WO Contrast   . MRI Brain WO Contrast   . US CAROTID DUPLEX DOPP COMP   . CBC with differential   . Comprehensive metabolic panel   . Troponin I   . Hemolysis index   . GFR   . APTT   . Protime - INR   . Urine BHCG POC   . Lipid panel (Fasting)   . CBC   . Troponin I   . Diet NPO effective now   . Urine bHCG POCT   . Cardiac monitoring (ED Only)   . Cardiac monitoring (ED ONLY)   . NPO (Including Aspirin) until dysphagia screen done and passed   . Nursing Stroke Protocol and NIHSS   . Dysphagia Screen   . Glucose POC   . ED Holding Orders Expire in 12 Hours   . Notify Admitting Attending ( Change in Condition)   . Notify Attending of Patient Arrival to Floor within 12 Hours   . Notify Physician (Vital Signs)   . Notify  Physician (Lab Results)   . Vital Signs Q4HR   . Bed Rest with Bathroom Privileges   . Vital Signs Per Routine   . Neuro checks   . Neuro Checks Q4H   . Notify Admitting Physician:Chest Pain/EKG   . Notify Admitting Physician:Change in Condition/Abnormal VS   . Vital  signs with pulse ox (Q4H)   . Up as tolerated   . Place sequential compression device   . Maintain sequential compression device   . Notify physician (specify)   . Document NIH Stroke Score   . Nursing swallow assessment   . Neuro checks   . Arrange for a sleep study   . Nasal Cannula Low-Flow (5 LPM or less)   . Daily Blood Pressure Goals   . Education: Stroke   . Telemetry Monitoring Continuous   . Reason for no statin therapy   . Full Code   . ED Unit Sec Comm Order   . OT eval and treat   . PT EVALUATE AND TREAT   . Glucose Whole Blood - POCT   . Echocardiogram 2D complete   . Saline lock IV   . Saline Lock   . Place (admit) for Observation Services   . [facility] ED Bed Request (Observation)       ASSESSMENT & PLAN     Suman Mccourt is a 42 y.o. female admitted with chest pain.    # Chest pain  -r/o cardiac causes  -EKG and initial troponin unremarkable  - get serial troponin, telemetry monitoring  -ASA, statins, sublingual nitroglycerin as needed  -Get cardiology consult in a.m. for possible stress test on Monday    # Lower back pain  -Acute onset  -Get x-ray of the lumbar spine  - Flexeril prn  -Patient has a long list of allergy to various pain medications    # Right arm weakness  -Associated with transient numbness of the right lower extremity  -Has previous history of TIA/CVA not on aspirin  -CT head without contrast unremarkable  -MRI brain, carotid Dopplers, PT/OT  -Dr/. Khosla on board follow recommendations    # Seizure disorder  -Continue with Keppra  -Seizure precaution    # Hyperlipidemia  -Statins    Hypertension  -Home antihypertensive    GI Prophylaxis      Nutrition    cardiac    DVT/VTE Prophylaxis  lovenox    Anticipated medical  stability for discharge:     Service status/Reason for ongoing hospitalization: Chest pain and right arm weakness  Anticipated Discharge Needs: TBD    Ronda Fairly, MD  09/15/2017 8:32 PM  Time Elapsed:  1hr

## 2017-09-15 NOTE — ED Notes (Signed)
[facility] ED NURSING NOTE FOR THE RECEIVING INPATIENT NURSE   ED NURSE [place]   [place] [address]   ADMISSION INFORMATION   [name] Cook is a 42 y.o. female admitted with a diagnosis of:    1. Right arm weakness       Isolation None   Holding Orders confirmed? Yes   Belongings Documented? Yes   Home medications sent to pharmacy confirmed? Yes   NURSING CARE   Mental Status   Alert x4   ADLs ADL   Independent with ADL's    Ambulation no difficulty   Pertinent Information  and Safety Concerns Patient chest pain and right sided weakness today.  PMH Stroke and TIA.  Went to MRI and MRI machine not big enough.  If she goes back to MRI she will need another dose of Ativan.       CT / NIH   CT Head ordered on this patient?  Yes   NIH/Dysphagia assessment done prior to admission? Yes    VITAL SIGNS     Time of Last Set of Vitals:    1830 Temperature 98    BP 149/64    Heart Rate 67    Respirations 20    Pulse Ox 99   IV LINES   IV Catheter Size: 20 g LAC  Peripheral IV 09/15/17 Left Antecubital (Active)   Site Assessment Clean;Dry;Intact 09/15/2017  2:35 PM   Dressing Status Clean;Dry;Intact 09/15/2017  2:35 PM   Number of days: 0          LAB RESULTS   Labs Reviewed   CBC AND DIFFERENTIAL - Abnormal; Notable for the following:        Result Value    Hgb 9.0 (*)     Hematocrit 31.6 (*)     MCH 22.2 (*)     MCHC 28.5 (*)     RDW 17 (*)     All other components within normal limits   COMPREHENSIVE METABOLIC PANEL - Abnormal; Notable for the following:     Globulin 4.0 (*)     All other components within normal limits   TROPONIN I   HEMOLYSIS INDEX   GFR   APTT   PT/INR   URINE BHCG POC SOFT   GLUCOSE WHOLE BLOOD - POCT          (07/2017)

## 2017-09-15 NOTE — ED Provider Notes (Signed)
EMERGENCY DEPARTMENT HISTORY AND PHYSICAL EXAM     Physician/Midlevel provider first contact with patient: 09/15/17 1426         Date: 09/15/2017  Patient Name: [name] Cook  Attending Physician: French Ana, MD  Advanced Practice Provider: Volanda Napoleon    History of Presenting Illness       History Provided By: Patient    Chief Complaint:  Chief Complaint   Patient presents with   . Chest Pain   . Dizziness   . Back Pain         Additional History: [name] Cook is a 42 y.o. female presenting to the ED with:    1. Chest pressure intermittently over the past 2 days. Substernal. Nonradiating. Today cp worse starting at 1pm while climbing stairs with associated nausea and dizziness, sweating. No vomiting. No sob.     2. Right sided decreased sensation in arm/leg and R sided weakness. Last known normal 4 days ago. Reports that 4 days ago, her husband noticed that the R side of her face was abnormal- she did not seek medical attention and reports it resolved on its own. However, then yesterday she noticed that she was having to lean against the wall on the R side when walking up the stairs. hx of CVA- she reports that he sxs were on the L and completely resolved. No headache. No visual changes.      3. Right sided lower back pain, radiating down the R leg. No fever. No loss of bowel/bladder control.     PCP: Margurite Auerbach, MD  SPECIALISTS:    No current facility-administered medications for this encounter.      Current Outpatient Prescriptions   Medication Sig Dispense Refill   . atorvastatin (LIPITOR) 40 MG tablet Take by mouth.     . LevETIRAcetam (KEPPRA PO) Take 500 mg by mouth 2 (two) times daily.        Marland Kitchen lidocaine (LIDODERM) 5 % Place 1 patch onto the skin daily. Remove & Discard patch within 12 hours or as directed by MD 6 each 0   . lisinopril (PRINIVIL,ZESTRIL) 40 MG tablet Take 40 mg by mouth daily.     . nitroglycerin (NITROSTAT) 0.4 MG SL tablet Place under the tongue.         Past History      Past Medical History:  Past Medical History:   Diagnosis Date   . Hypercholesteremia    . Hypertension    . Seizures    . Stroke     2015 and 2017, right side   . TIA (transient ischemic attack) 2017       Past Surgical History:  Past Surgical History:   Procedure Laterality Date   . APPENDECTOMY     . CHOLECYSTECTOMY     . EGD, BIOPSY N/A 04/16/2017    Procedure: EGD, BIOPSY;  Surgeon: Pershing Proud, MD;  Location: [facility];  Service: Gastroenterology;  Laterality: N/A;       Family History:  Family History   Problem Relation Age of Onset   . Myocardial Infarction Father 65   . Deep vein thrombosis Father        Social History:  Social History   Substance Use Topics   . Smoking status: Never Smoker   . Smokeless tobacco: Never Used   . Alcohol use No       Allergies:  Allergies   Allergen Reactions   . Contrast [Iodinated Diagnostic Agents] Anaphylaxis  Patient woke up in ICU after having CT with contrast, last remembers being in CT   . Fioricet [Butalbital-Apap-Caffeine] Hives   . Iodine    . Motrin [Ibuprofen] Swelling   . Percocet [Oxycodone-Acetaminophen]    . Shellfish-Derived Products    . Toradol [Ketorolac Tromethamine]    . Tramadol    . Tylenol [Acetaminophen] Hives       Review of Systems     Review of Systems   Constitutional: Positive for diaphoresis. Negative for fever.   HENT: Negative for trouble swallowing.    Eyes: Negative for visual disturbance.   Respiratory: Negative for shortness of breath.    Cardiovascular: Positive for chest pain.   Gastrointestinal: Positive for nausea. Negative for abdominal pain and vomiting.   Genitourinary: Negative.    Musculoskeletal: Positive for back pain.   Skin: Negative for wound.   Neurological: Positive for dizziness, weakness and numbness. Negative for syncope and headaches.       Physical Exam     Vitals:    09/15/17 1538 09/15/17 1601 09/15/17 1819 09/15/17 1823   BP:  149/64 (!) 170/96    Pulse:  67  81   Resp: 20 17     Temp:        TempSrc:       SpO2:  99%  99%   Weight:       Height:           Physical Exam   Constitutional: She is oriented to person, place, and time. She appears well-developed and well-nourished. No distress.   HENT:   Head: Normocephalic and atraumatic.   Eyes: Pupils are equal, round, and reactive to light. EOM are normal.   Neck: Normal range of motion. Neck supple.   Cardiovascular: Normal rate, regular rhythm, normal heart sounds and intact distal pulses.    Pulmonary/Chest: Effort normal and breath sounds normal. No respiratory distress.   Abdominal: Soft. She exhibits no distension. There is no tenderness.   Musculoskeletal: Normal range of motion. She exhibits no deformity.   Mild ttp to R lower lumbar region without midline ttp    Neurological: She is alert and oriented to person, place, and time. No cranial nerve deficit. Coordination normal.   Sensation dec to RUE as compared to LUE. Grip strength intact b/l. Pronator drift on R. Otherwise normal strength in UE/LE. No drift b/l LE. Gait deferred.   Skin: Skin is warm and dry. No rash noted. She is not diaphoretic.   Psychiatric: She has a normal mood and affect.       Diagnostic Study Results     Labs -     Results     Procedure Component Value Units Date/Time    CBC with differential [[id number]]  (Abnormal) Collected:  09/15/17 1430    Specimen:  Blood from Blood Updated:  09/15/17 1626     WBC 8.29 x10 3/uL      Hgb 9.0 (L) g/dL      Hematocrit 40.9 (L) %      Platelets 213 x10 3/uL      RBC 4.05 x10 6/uL      MCV 78.0 fL      MCH 22.2 (L) pg      MCHC 28.5 (L) g/dL      RDW 17 (H) %      MPV Unmeasured fL      Neutrophils 62.0 %      Lymphocytes Automated 28.0 %  Monocytes 6.8 %      Eosinophils Automated 2.2 %      Basophils Automated 0.8 %      Immature Granulocyte 0.2 %      Nucleated RBC 0.0 /100 WBC      Neutrophils Absolute 5.14 x10 3/uL      Abs Lymph Automated 2.32 x10 3/uL      Abs Mono Automated 0.56 x10 3/uL      Abs Eos Automated 0.18 x10  3/uL      Absolute Baso Automated 0.07 x10 3/uL      Absolute Immature Granulocyte 0.02 x10 3/uL      Absolute NRBC 0.00 x10 3/uL     Urine BHCG POC [[id number]] Collected:  09/15/17 1508     Updated:  09/15/17 1514     Urine bHCG POC Negative    APTT [[id number]] Collected:  09/15/17 1430     Updated:  09/15/17 1513     PTT 32 sec     Protime - INR [[id number]] Collected:  09/15/17 1430    Specimen:  Blood Updated:  09/15/17 1513     PT 13.2 sec      PT INR 1.0    Troponin I [[id number]] Collected:  09/15/17 1430    Specimen:  Blood Updated:  09/15/17 1512     Troponin I <0.01 ng/mL     Glucose Whole Blood - POCT [[id number]] Collected:  09/15/17 1504     Updated:  09/15/17 1507     POCT - Glucose Whole blood 93 mg/dL     Comprehensive metabolic panel [[id number]]  (Abnormal) Collected:  09/15/17 1430    Specimen:  Blood Updated:  09/15/17 1505     Glucose 84 mg/dL      BUN 53.6 mg/dL      Creatinine 0.9 mg/dL      Sodium 644 mEq/L      Potassium 3.5 mEq/L      Chloride 107 mEq/L      CO2 27 mEq/L      Calcium 9.0 mg/dL      Protein, Total 7.9 g/dL      Albumin 3.9 g/dL      AST (SGOT) 14 U/L      ALT 16 U/L      Alkaline Phosphatase 88 U/L      Bilirubin, Total 0.7 mg/dL      Globulin 4.0 (H) g/dL      Albumin/Globulin Ratio 1.0     Anion Gap 7.0    Hemolysis index [[id number]] Collected:  09/15/17 1430     Updated:  09/15/17 1505     Hemolysis Index 4    GFR [[id number]] Collected:  09/15/17 1430     Updated:  09/15/17 1505     EGFR >60.0          Radiologic Studies -   Radiology Results (24 Hour)     Procedure Component Value Units Date/Time    CT Head WO Contrast [[id number]] Collected:  09/15/17 1545    Order Status:  Completed Updated:  09/15/17 1550    Narrative:       CT HEAD    CLINICAL STATEMENT: Weakness in the right body. Decreased alertness.    COMPARISON: CT head dated 05/11/2016.    TECHNIQUE: Multiple 5 mm thickness axial images were obtained from the  skull base to the vertex without IV contrast  administration. Coronal and  sagittal reformatted images were then obtained.    Note that CT scanning  at this site utilizes multiple dose reduction  techniques including automatic exposure control, adjustment of the MAS  and/or KVP according to patient size, and use of iterative  reconstruction technique.    FINDINGS: Gray-white matter junction differentiation is appropriate. No  intracranial hemorrhage is identified. The ventricles are appropriate in  size, shape and are midline. There is no intracranial mass, mass-effect  or midline shift. No abnormal intra or extra-axial fluid collections are  identified.     The visualized paranasal sinuses and mastoid air cells are well-aerated.  The osseous calvarium is intact.      Impression:         No CT evidence of an acute intracranial abnormality.    Aldean Ast, MD   09/15/2017 3:46 PM    XR Chest  AP Portable [[id number]] Collected:  09/15/17 1444    Order Status:  Completed Updated:  09/15/17 1448    Narrative:       INDICATION: cp    TECHNIQUE: Portable AP radiograph of the chest    COMPARISON: Chest radiograph dated 04/15/2017.    FINDINGS: Cardiomediastinal silhouette is normal in size and  appearance.  Lungs are clear. Pleural spaces are clear. No pneumothorax.   Upper abdomen and regional osseous structures demonstrate no acute  abnormality.      Impression:           No acute thoracic process.     Aldean Ast, MD   09/15/2017 2:44 PM      .    Medical Decision Making   I am the first provider for this patient.    I reviewed the vital signs, available nursing notes, past medical history, past surgical history, family history and social history.    Vital Signs-Reviewed the patient's vital signs.     Patient Vitals for the past 12 hrs:   BP Temp Pulse Resp   09/15/17 1823 - - 81 -   09/15/17 1819 (!) 170/96 - - -   09/15/17 1601 149/64 - 67 17   09/15/17 1538 - - - 20   09/15/17 1510 (!) 129/91 - 65 -   09/15/17 1440 178/77 - 76 (!) 10   09/15/17 1432 (!) 187/93 - 75 14    09/15/17 1408 (!) 132/92 98.4 F (36.9 C) 70 18       Pulse Oximetry Analysis - Normal SpO2: 100 % on RA      Procedures:       Old Medical Records: Old medical records.       ED Medications  Medications   morphine injection 4 mg (4 mg Intravenous Given 09/15/17 1453)   aspirin tablet 325 mg (325 mg Oral Given 09/15/17 1735)   morphine injection 2 mg (2 mg Intravenous Given 09/15/17 1817)   LORazepam (ATIVAN) injection 1 mg (1 mg Intravenous Given 09/15/17 1735)       ED Course:   ED Course as of Sep 16 1831   Sat Sep 15, 2017   1647 Delay in cbc- lab just resulted the cbc after being called. Neuro paged. Patient still with NIH stroke scale 2  [JG]   1706 D/w Dr. Lynnell Jude rec MRI.will consult   [JG]      ED Course User Index  [JG] Volanda Napoleon, PA       Provider Notes:   1. Weakness- NIH stroke scale of 2, not a TPA candidate given that symptoms started 1 day ago with last known normal 4 days  ago.  Symptoms persist in the ED.  CT head normal, aspirin given.  Neuro to consult.    2.  Chest pressure- intermittently over the past 2 days, with the most recent episode at 1 PM today.  EKG without ischemia, negative troponin.  Admit also to trend troponins.  Do not suspect dissection.    3.  Back pain- do not suspect cauda equina or spinal cord pathology.         D/w French Ana, MD      Diagnosis     Clinical Impression:   1. Right arm weakness    2. Chest pressure    3. Acute right-sided low back pain, with sciatica presence unspecified        Treatment Plan:   ED Disposition     ED Disposition Condition Date/Time Comment    Observation  Sat Sep 15, 2017  5:33 PM Admitting Physician: Clarita Leber [[id number]]   Diagnosis: Right arm weakness [342872]   Estimated Length of Stay: < 2 midnights   Tentative Discharge Plan?: Home or Self Care [1]   Patient Class: Observation [104]   Bed request comments: unit 25              _______________________________    CHART OWNERSHIPAnastasia Pall, PA-C, am the  primary clinician of record.  _______________________________       Volanda Napoleon, PA  09/15/17 1833       French Ana, MD  09/16/17 [phone]

## 2017-09-15 NOTE — ED Triage Notes (Addendum)
Patient with chest pain, dizziness and lower back pain.  Pain 8/10.  PMH stroke 2015, TIA, Gall bladdar out this year, HTN.  Symptoms started yesterday.  Took 3 nitro at home with no change in chest pain.

## 2017-09-16 ENCOUNTER — Observation Stay: Payer: Medicaid Other

## 2017-09-16 ENCOUNTER — Other Ambulatory Visit: Payer: Medicaid Other

## 2017-09-16 LAB — CBC
Absolute NRBC: 0 10*3/uL (ref 0.00–0.00)
Hematocrit: 29.7 % — ABNORMAL LOW (ref 34.7–43.7)
Hgb: 8.5 g/dL — ABNORMAL LOW (ref 11.4–14.8)
MCH: 22.3 pg — ABNORMAL LOW (ref 25.1–33.5)
MCHC: 28.6 g/dL — ABNORMAL LOW (ref 31.5–35.8)
MCV: 78 fL (ref 78.0–96.0)
MPV: 12.6 fL — ABNORMAL HIGH (ref 8.9–12.5)
Nucleated RBC: 0 /100 WBC (ref 0.0–0.0)
Platelets: 188 10*3/uL (ref 142–346)
RBC: 3.81 10*6/uL — ABNORMAL LOW (ref 3.90–5.10)
RDW: 17 % — ABNORMAL HIGH (ref 11–15)
WBC: 7.69 10*3/uL (ref 3.10–9.50)

## 2017-09-16 LAB — HEMOLYSIS INDEX: Hemolysis Index: 1 (ref 0–18)

## 2017-09-16 LAB — LIPID PANEL
Cholesterol / HDL Ratio: 4.8
Cholesterol: 138 mg/dL (ref 0–199)
HDL: 29 mg/dL — ABNORMAL LOW (ref 40–9999)
LDL Calculated: 88 mg/dL (ref 0–99)
Triglycerides: 106 mg/dL (ref 34–149)
VLDL Calculated: 21 mg/dL (ref 10–40)

## 2017-09-16 LAB — TROPONIN I: Troponin I: <0.01 ng/mL (ref 0.00–0.09)

## 2017-09-16 MED ORDER — ONDANSETRON HCL 4 MG/2ML IJ SOLN
4.00 mg | Freq: Once | INTRAMUSCULAR | Status: DC | PRN
Start: 2017-09-16 — End: 2017-09-20

## 2017-09-16 MED ORDER — CLOPIDOGREL BISULFATE 75 MG PO TABS
75.00 mg | ORAL_TABLET | Freq: Every day | ORAL | Status: DC
Start: 2017-09-16 — End: 2017-09-20
  Administered 2017-09-16 – 2017-09-20 (×5): 75 mg via ORAL
  Filled 2017-09-16 (×5): qty 1

## 2017-09-16 MED ORDER — MORPHINE SULFATE 4 MG/ML IJ/IV SOLN (WRAP)
4.0000 mg | Status: AC | PRN
Start: 2017-09-16 — End: 2017-09-17
  Administered 2017-09-16 – 2017-09-17 (×6): 4 mg via INTRAVENOUS
  Filled 2017-09-16 (×6): qty 1

## 2017-09-16 NOTE — UM Notes (Signed)
09/15/17 1733  Place (admit) for Observation Services (Adult Observation Admit Panel (AX))         Admitting Physician Georgette Dover B   Diagnosis Right arm weakness   Estimated Length of Stay < 2 midnights   Tentative Discharge Plan? Home or Self Care   Patient Class Observation     Medicaid HMO    42 y.o. female with a PMHx of hypertension, hyperlipidemia, history of CVA/TIA, seizure disorder  presented with left-sided chest pain and lower back pain since yesterday.  Patient reported that she started to have sudden onset of sharp radicular 7/10 constant lower back pain associated with transient numbness of the right leg and weakness of the right arm.  Patient also had sharp, constant, 6/10 left-sided chest pain not associated with shortness of breath.  The pain became intense since this morning for which she came to the ED. no similar chest pain in the past.  Patient had an abnormal stress test in 2014 but developed anaphylactic reaction during LHC.  She denied headache, blurring of vision, nausea, vomiting, ataxia.    BP 162/75   Pulse 69   Temp 97.6 F (36.4 C) (Oral)   Resp 18   Ht 1.778 m (5\' 10" )   Wt 121.1 kg (267 lb)   LMP 09/13/2017   SpO2 100%   BMI 38.31 kg/m     CT head without contrast unremarkable    H&H 9/31    ED Medication Administration from 09/15/2017 1405 to 09/15/2017 1935     Date/Time Order Dose Route Action   09/15/2017 1453 morphine injection 4 mg 4 mg Intravenous Given   09/15/2017 1735 aspirin tablet 325 mg 325 mg Oral Given   09/15/2017 1817 morphine injection 2 mg 2 mg Intravenous Given   09/15/2017 1735 LORazepam (ATIVAN) injection 1 mg 1 mg Intravenous Given     Assessment and Plan  # Chest pain  -r/o cardiac causes  -EKG and initial troponin unremarkable  - get serial troponin, telemetry monitoring  -ASA, statins, sublingual nitroglycerin as needed  -Get cardiology consult in a.m. for possible stress test on Monday    # Lower back pain  -Acute onset  -Get x-ray  of the lumbar spine  - Flexeril prn  -Patient has a long list of allergy to various pain medications    # Right arm weakness  -Associated with transient numbness of the right lower extremity  -Has previous history of TIA/CVA not on aspirin  -CT head without contrast unremarkable  -MRI brain, carotid Dopplers, PT/OT  -Dr/. Khosla on board follow recommendations    # Seizure disorder  -Continue with Keppra  -Seizure precaution    # Hyperlipidemia  -Statins    Hypertension  -Home antihypertensive    GI Prophylaxis      Nutrition    cardiac    DVT/VTE Prophylaxis  lovenox    Anticipated medical stability for discharge:     Service status/Reason for ongoing hospitalization: Chest pain and right arm weakness    Scheduled Meds:  Current Facility-Administered Medications   Medication Dose Route Frequency   . aspirin  81 mg Oral Daily   . atorvastatin  40 mg Oral QHS   . enoxaparin  40 mg Subcutaneous QHS   . levETIRAcetam  500 mg Oral Q12H SCH   . lisinopril  40 mg Oral Daily     PRN Meds:.acetaminophen, acetaminophen, cyclobenzaprine, hydrALAZINE, labetalol, morphine, nitroglycerin, ondansetron    Elfredia Nevins, RN, BSN  Utilization Review Case  Manager  Case Management  [facility]   [address]  [place], [place]  [address]  [phone]  [place] [phone]

## 2017-09-16 NOTE — Progress Notes (Signed)
[facility]  PROGRESS NOTE      Patient: [name] Cook  Date: 09/16/2017   LOS: 0 Days  Admission Date: 09/15/2017   MRN: [id number]  Attending: Marya Amsler  Please contact me on the following Spectralink [phone]         ASSESSMENT/PLAN     Interval Summary:   [name] Cook is a 42 y.o. female with a PMHx of hypertension, hyperlipidemia, morbid obesity, history of CVA/TIA, seizure disorder admitted with left-sided chest pain and R sided hemiparesis and numbness.    Patient Active Hospital Problem List:    1. CP-nonexertional, intermittent.  2. Hypertensive crisis; 187/93    -Troponin x 3 unremarkable.  -Cardiology consulted, will follow the recommendations.  -On ASA, Plavix, Statin, NTG    3. R hemiparesis and hemiplegia: for past many days.  4. HTN  5. HLD  6. Seizure     -Neurology on board. Recommended ASA and Plavix. Patient already on Statin.  -CT head is negative. MRI not done, patient couldn't fir in MRI scanner. Will proceed with echocardiogram.  -PT/OT  -Neuro checks    9. Morbid obesity: BMI 44.91    -Lifestyle modification with diet and exercise.    Analgesia: Tylenol    Nutrition: Low salt diet.    DVT Prophylaxis:Enoxaparin.       Code Status: Full.    DISPO: Home    Family Contact:        SUBJECTIVE     [name] Cook states she has no complaints, however had some episodes of CP. NTG prn.    MEDICATIONS     Current Facility-Administered Medications   Medication Dose Route Frequency   . aspirin  81 mg Oral Daily   . atorvastatin  40 mg Oral QHS   . enoxaparin  40 mg Subcutaneous QHS   . levETIRAcetam  500 mg Oral Q12H SCH   . lisinopril  40 mg Oral Daily       PHYSICAL EXAM     Vitals:    09/16/17 0400   BP: 159/90   Pulse:    Resp: 18   Temp: 98.4 F (36.9 C)   SpO2: 100%       Temperature: Temp  Min: 96.5 F (35.8 C)  Max: 98.4 F (36.9 C)  Pulse: Pulse  Min: 63  Max: 81  Respiratory: Resp  Min: 10  Max: 20  Non-Invasive BP: BP  Min: 129/91  Max: 187/93  Pulse Oximetry SpO2  Min: 98 %  Max: 100  %    Intake and Output Summary (Last 24 hours) at Date Time    Intake/Output Summary (Last 24 hours) at 09/16/17 1104  Last data filed at 09/16/17 1040   Gross per 24 hour   Intake              920 ml   Output                0 ml   Net              920 ml     GEN APPEARANCE: NAD.  HEENT: PERLA; EOMI; Conjunctiva Clear  NECK: Supple; No bruits  CVS: RRR, S1, S2; No M/G/R  LUNGS: Diminished BS b/l.CTAB; No Wheezes; No Rhonchi: No rales  ABD: Soft; No TTP; + Normoactive BS  EXT: Pulses 2+ and intact  Skin exam:  Normal.  NEURO: No Focal neurological deficits  CAP REFILL:  Normal  MENTAL STATUS:  AO x 3.  Exam done by Marya Amsler, MD on 09/16/17 at 11:04 AM      LABS       Recent Labs  Lab 09/16/17  0338 09/15/17  1430   WBC 7.69 8.29   RBC 3.81* 4.05   Hgb 8.5* 9.0*   Hematocrit 29.7* 31.6*   MCV 78.0 78.0   Platelets 188 213         Recent Labs  Lab 09/15/17  1430   Sodium 141   Potassium 3.5   Chloride 107   CO2 27   BUN 12.0   Creatinine 0.9   Glucose 84   Calcium 9.0         Recent Labs  Lab 09/15/17  1430   ALT 16   AST (SGOT) 14   Bilirubin, Total 0.7   Albumin 3.9   Alkaline Phosphatase 88         Recent Labs  Lab 09/16/17  0338 09/15/17  2128 09/15/17  1430   Troponin I <0.01 <0.01 <0.01         Recent Labs  Lab 09/15/17  1430   PT INR 1.0   PT 13.2   PTT 32       Microbiology Results     None           RADIOLOGY     Upon my review:    SignedMarya Amsler  11:04 AM 09/16/2017   Total time spent: 45 min

## 2017-09-16 NOTE — Plan of Care (Signed)
Problem: Physical Therapy  Goal: By discharge, patient will perform mobility at the patient's highest functional potential. See PT evaluation/note for goals.  Outcome: Completed Date Met: 09/16/17  Discharge Recommendation: Home with supervision  DME Recommended for Discharge:  (none needed)    Is an Occupational Therapy Evaluation Indicated at this time? No, the patient does not require a OT evaluation.    Treatment/Interventions: No skilled interventions needed at this time  PT Frequency: one time visit - therapy discontinued     PMP Activity: Step 7 - Walks out of Room  Distance Walked (ft) (Step 6,7): 70 Feet  (Please See Therapy Evaluation for device and assistance level needed)    Goals: N/A

## 2017-09-16 NOTE — Plan of Care (Signed)
Problem: Every Day - Stroke  Goal: Core/Quality measure requirements - Daily  Outcome: Progressing   09/16/17 1808   Goal/Interventions addressed this shift   Core/Quality measure requirements - Daily  VTE Prevention: Ensure anticoagulant(s) administered and/or anti-embolism stockings/devices documented by end of day 2;Once lipid panel has resulted, check LDL. Contact provider for statin order if LDL > 70 (or ensure contraindication documented by LIP).;Continue stroke education (must include Modifiable Risk Factors, Warning Signs and Symptoms of Stroke, Activation of Emergency Medical System and Follow-up Appointments). Ensure handout has been given and documented.     Goal: Neurological status is stable or improving  Outcome: Progressing   09/16/17 1808   Goal/Interventions addressed this shift   Neurological status is stable or improving Monitor/assess/document neurological assessment (Stroke: every 4 hours);Monitor/assess NIH Stroke Scale;Re-assess NIH Stroke Scale for any change in status;Observe for seizure activity and initiate seizure precautions if indicated;Perform CAM Assessment     Goal: Stable vital signs and fluid balance  Outcome: Progressing   09/16/17 1808   Goal/Interventions addressed this shift   Stable vital signs and fluid balance  Position patient for maximum circulation/cardiac output;Monitor and assess vitals every 4 hours or as ordered and hemodynamic parameters;Monitor intake and output. Notify LIP if urine output is < 30 mL/hour.;Encourage oral fluid intake;Apply telemetry monitor as ordered   Complaining of chest pain radiating to the back, relieved with Morphine IV. Troponin x 3 negative. SB-SR. Lumbar Spine Xray showed degenerative disease. Carotid ultrasound and echocardiogram results pending. Plan: Continue current medical management. Flexeril for back pain. Nothing to eat and drink by mouth after midnight for nuclear stress test.

## 2017-09-16 NOTE — Consults (Signed)
NEUROLOGY CONSULTATION    Date Time: 09/16/17 8:37 AM  Patient Name: [name] Cook  Attending Physician: Clarita Leber, MD      Assessment & Plan:   R sided weakness and numbness - ongoing x days - head CT negative.  Would complete CVA work up - pt could not fit into MRI scanner.     ASA + Plavix   Echocardiogram   CD   Will follow    History of Present Illness:   42 yo female with Hx HTN, seizures, prior strokes, TIAs.  Pt was admitted yesterday with symptoms of R sided numbness and weakness (ongoing x multiple days) but also with chest pain, back pain.  She reports ongoing R sided numbness and weakness.  Of note, some notes indicated she has Hx R sided weakness from prior stroke - pt tells me in fact it was her L side which was affected.    Past Medical History:     Past Medical History:   Diagnosis Date   . Hypercholesteremia    . Hypertension    . Seizures    . Stroke     2015 and 2017, right side   . TIA (transient ischemic attack) 2017       Meds:      Scheduled Meds: PRN Meds:      aspirin 81 mg Oral Daily   atorvastatin 40 mg Oral QHS   enoxaparin 40 mg Subcutaneous QHS   levETIRAcetam 500 mg Oral Q12H SCH   lisinopril 40 mg Oral Daily       Continuous Infusions:     acetaminophen 650 mg Once PRN   acetaminophen 650 mg Q6H PRN   cyclobenzaprine 10 mg TID PRN   hydrALAZINE 10 mg Q3H PRN   labetalol 10 mg Q15 Min PRN   morphine 4 mg Q4H PRN   nitroglycerin 0.4 mg Q5 Min PRN   ondansetron 4 mg Q8H PRN         I personally reviewed all of the medications.  Medication list generated using all available resources.    Allergies   Allergen Reactions   . Contrast [Iodinated Diagnostic Agents] Anaphylaxis     Patient woke up in ICU after having CT with contrast, last remembers being in CT   . Fioricet [Butalbital-Apap-Caffeine] Hives   . Iodine    . Motrin [Ibuprofen] Swelling   . Percocet [Oxycodone-Acetaminophen]    . Shellfish-Derived Products    . Toradol [Ketorolac Tromethamine]    . Tramadol    .  Tylenol [Acetaminophen] Hives       Social & Family History:     Social History     Social History   . Marital status: Single     Spouse name: N/A   . Number of children: N/A   . Years of education: N/A     Occupational History   . Not on file.     Social History Main Topics   . Smoking status: Never Smoker   . Smokeless tobacco: Never Used   . Alcohol use No   . Drug use: No   . Sexual activity: Not on file     Other Topics Concern   . Not on file     Social History Narrative   . No narrative on file     Family History   Problem Relation Age of Onset   . Myocardial Infarction Father 72   . Deep vein thrombosis Father  Review of Systems:   No headache, eye, ear nose, throat problems; no coughing or wheezing or shortness of breath, No chest pain or orthopnea, no abdominal pain, nausea or vomiting, No pain in the body or extremities, no psychiatric, neurological, endocrine, hematological or cardiac complaints except as noted above. Marland Kitchen    Physical Exam:   Blood pressure 159/90, pulse 66, temperature 98.4 F (36.9 C), temperature source Oral, resp. rate 18, height 1.778 m (5\' 10" ), weight 141.4 kg (311 lb 12.8 oz), last menstrual period 09/13/2017, SpO2 100 %.    HEENT: Normocephalic. Non-icter, no congestion, no carotid bruits  Lungs:  CTA bil  Cardiac:  S1,S2, normal rate and rhythm  Neck: supple, no lymphadenopathy, no thyromegaly, no JVD, no cartoid bruits  Extremities: no clubbing, cyanosis, or edema  Skin: no rashes or lesions noted    Neuro:  Level of consciousness:  Alert and appropriate  Oriented:  X 3  Cognition:  Intact naming, recognition, concentration and following complex commands  Cranial Nerves:  II-XII intact except reduced temp R hemiface  Strength:  + mild R UE drift  Coordination:  Intact FTN testing  Reflexes:  +1 throughout, down going toes bil  Sensation: Intact x 4 extremities to LT, temp, vibration except diminished temp R arm and leg  Gait:  Deferred     Labs:     Recent Labs  Lab  09/15/17  1430   Glucose 84   BUN 12.0   Creatinine 0.9   Calcium 9.0   Sodium 141   Potassium 3.5   Chloride 107   CO2 27   Albumin 3.9   AST (SGOT) 14   ALT 16   Bilirubin, Total 0.7   Alkaline Phosphatase 88       Recent Labs  Lab 09/16/17  0338 09/15/17  1430   WBC 7.69 8.29   Hgb 8.5* 9.0*   Hematocrit 29.7* 31.6*   MCV 78.0 78.0   MCH 22.3* 22.2*   MCHC 28.6* 28.5*   Platelets 188 213         Recent Labs      09/15/17   1430   PTT  32   PT  13.2   PT INR  1.0          Radiology Results (24 Hour)     Procedure Component Value Units Date/Time    CT Head WO Contrast [[id number]] Collected:  09/15/17 1545    Order Status:  Completed Updated:  09/15/17 1550    Narrative:       CT HEAD    CLINICAL STATEMENT: Weakness in the right body. Decreased alertness.    COMPARISON: CT head dated 05/11/2016.    TECHNIQUE: Multiple 5 mm thickness axial images were obtained from the  skull base to the vertex without IV contrast administration. Coronal and  sagittal reformatted images were then obtained.    Note that CT scanning at this site utilizes multiple dose reduction  techniques including automatic exposure control, adjustment of the MAS  and/or KVP according to patient size, and use of iterative  reconstruction technique.    FINDINGS: Gray-white matter junction differentiation is appropriate. No  intracranial hemorrhage is identified. The ventricles are appropriate in  size, shape and are midline. There is no intracranial mass, mass-effect  or midline shift. No abnormal intra or extra-axial fluid collections are  identified.     The visualized paranasal sinuses and mastoid air cells are well-aerated.  The osseous calvarium is intact.  Impression:         No CT evidence of an acute intracranial abnormality.    Aldean Ast, MD   09/15/2017 3:46 PM    XR Chest  AP Portable [[id number]] Collected:  09/15/17 1444    Order Status:  Completed Updated:  09/15/17 1448    Narrative:       INDICATION: cp    TECHNIQUE: Portable AP  radiograph of the chest    COMPARISON: Chest radiograph dated 04/15/2017.    FINDINGS: Cardiomediastinal silhouette is normal in size and  appearance.  Lungs are clear. Pleural spaces are clear. No pneumothorax.   Upper abdomen and regional osseous structures demonstrate no acute  abnormality.      Impression:           No acute thoracic process.     Aldean Ast, MD   09/15/2017 2:44 PM           All recent brain and spine imaging (MRI, CT) results reviewed.    Chart reviewed    Code status confirmed    Case discussed with: patient and ER attending    40 minutes; >50% time spent in counseling or coordination of care    Signed by: Ardelle Anton, MD  Spectralink: [phone]       Answering Service: [phone]

## 2017-09-16 NOTE — Consults (Signed)
[facility] CARDIOLOGY CONSULTATION REPORT  [facility]        Date Time: 09/16/17 11:37 AM  Patient Name: [name] Cook  Requesting Physician: Clarita Leber, MD  Consulting Cardiologist:  Ma Hillock, MD  MRN:  [id number]  CSN:   [id number]  Date of Admission:  09/15/2017       Reason for Consultation:   Chest pain    History:   [name] Cook is a 42 y.o. female admitted on 09/15/2017.  We have been asked by Clarita Leber, MD,  to provide cardiac consultation, regarding pain.  Patient has a history of hypercholesterolemia and hypertension.  She relates a 3-day history of retrosternal chest discomfort described as a pressure tightness radiating through to her back.  It was exacerbated by walking upstairs at her residence.  The discomfort was intermittent.  She has had no similar symptoms in the past.    Past Medical History:     Past Medical History:   Diagnosis Date   . Hypercholesteremia    . Hypertension    . Seizures    . Stroke     2015 and 2017, right side   . TIA (transient ischemic attack) 2017       Past Surgical History:     Past Surgical History:   Procedure Laterality Date   . APPENDECTOMY     . CHOLECYSTECTOMY     . EGD, BIOPSY N/A 04/16/2017    Procedure: EGD, BIOPSY;  Surgeon: Pershing Proud, MD;  Location: [facility];  Service: Gastroenterology;  Laterality: N/A;       Family History:     Family History   Problem Relation Age of Onset   . Myocardial Infarction Father 50   . Deep vein thrombosis Father        Social History:     Social History     Social History   . Marital status: Single     Spouse name: N/A   . Number of children: N/A   . Years of education: N/A     Social History Main Topics   . Smoking status: Never Smoker   . Smokeless tobacco: Never Used   . Alcohol use No   . Drug use: No   . Sexual activity: Not on file     Other Topics Concern   . Not on file     Social History Narrative   . No narrative on file       Allergies:     Allergies    Allergen Reactions   . Contrast [Iodinated Diagnostic Agents] Anaphylaxis     Patient woke up in ICU after having CT with contrast, last remembers being in CT   . Fioricet [Butalbital-Apap-Caffeine] Hives   . Iodine    . Motrin [Ibuprofen] Swelling   . Percocet [Oxycodone-Acetaminophen]    . Shellfish-Derived Products    . Toradol [Ketorolac Tromethamine]    . Tramadol    . Tylenol [Acetaminophen] Hives       Medications:     Prescriptions Prior to Admission   Medication Sig   . atorvastatin (LIPITOR) 40 MG tablet Take by mouth.   . LevETIRAcetam (KEPPRA PO) Take 500 mg by mouth 2 (two) times daily.      Marland Kitchen lisinopril (PRINIVIL,ZESTRIL) 40 MG tablet Take 40 mg by mouth daily.   Marland Kitchen lidocaine (LIDODERM) 5 % Place 1 patch onto the skin daily. Remove & Discard patch within 12 hours or as directed by  MD   . nitroglycerin (NITROSTAT) 0.4 MG SL tablet Place under the tongue.        Current Facility-Administered Medications   Medication Dose Route Frequency   . aspirin  81 mg Oral Daily   . atorvastatin  40 mg Oral QHS   . clopidogrel  75 mg Oral Daily   . enoxaparin  40 mg Subcutaneous QHS   . levETIRAcetam  500 mg Oral Q12H SCH   . lisinopril  40 mg Oral Daily         Review of Systems:    Comprehensive review of systems including constitutional, eyes, ears, nose, mouth, throat, cardiovascular, GI, GU, musculoskeletal, integumentary, respiratory, neurologic, psychiatric, and endocrine is negative other than what is mentioned already in the history of present illness    Physical Exam:     VITAL SIGNS PHYSICAL EXAM   Vitals:    09/16/17 0400   BP: 159/90   Pulse:    Resp: 18   Temp: 98.4 F (36.9 C)   SpO2: 100%     Temp (24hrs), Avg:97.7 F (36.5 C), Min:96.5 F (35.8 C), Max:98.4 F (36.9 C)      Intake and Output Summary (Last 24 hours) at Date Time    Intake/Output Summary (Last 24 hours) at 09/16/17 1137  Last data filed at 09/16/17 1040   Gross per 24 hour   Intake              920 ml   Output                0 ml    Net              920 ml       Telemetry: no changes Physical Exam  General: awake, alert, breathing comfortably, no acute distress  Head: normocephalic  Eyes: Lids & conjunctiva normal  Cardiovascular: regular rate and rhythm, normal S1, S2, no S3, no S4, no murmurs, rubs or gallops  Neck: No JVD;  No Carotid bruit  Lungs: clear to auscultation bilateraly, without wheezing, rhonchi, or rales  Abdomen: soft, non-tender, no HJR  Extremities: no edema  Pulse: 2+ equal bilaterally   Neurological: No gross motor defect  Psychiatric: Alert and oriented X3, mood and affect normal  Musculoskeletal: normal strength and tone     Labs Reviewed:     Recent Labs  Lab 09/16/17  0338 09/15/17  2128 09/15/17  1430   Troponin I <0.01 <0.01 <0.01           Recent Labs  Lab 09/16/17  0338   Cholesterol 138   Triglycerides 106   HDL 29*   LDL Calculated 88       Recent Labs  Lab 09/15/17  1430   Bilirubin, Total 0.7   Protein, Total 7.9   Albumin 3.9   ALT 16   AST (SGOT) 14           Recent Labs  Lab 09/15/17  1430   PT 13.2   PT INR 1.0   PTT 32       Recent Labs  Lab 09/16/17  0338 09/15/17  1430   WBC 7.69 8.29   Hgb 8.5* 9.0*   Hematocrit 29.7* 31.6*   Platelets 188 213       Recent Labs  Lab 09/15/17  1430   Sodium 141   Potassium 3.5   Chloride 107   CO2 27   BUN 12.0   Creatinine 0.9  EGFR >60.0   Glucose 84   Calcium 9.0       DIAGNOSTIC    EKG:     chest X-ray normal cardiac silhouette clear lung fields    Assessment:    Chest  pain.  Risk factors of hypertension and hypercholesterolemia.   Normal cardiovascular exam.  Normal troponin levels       Recommendations:    Echocardiogram results pending.   Stress myocardial perfusion study        Signed by: Ma Hillock, MD [facility]    [facility]  MD Spectralink [phone] or [phone] (8am-5pm)  Office/Pager: [phone]  NP Spectralink [phone]   After hours, non urgent consult line [phone]  After Hours, urgent consults [phone]

## 2017-09-16 NOTE — PT Eval Note (Signed)
[facility]  Physical Therapy Evaluation and D/C     Patient: [name] Cook  MRN#: [id number]  Unit: [facility]  Bed: A2502/A2502-B    Time of Evaluation:  Time Calculation  PT Received On: 09/16/17  Start Time: 0949  Stop Time: 1002  Time Calculation (min): 13 min    Chart Review and Collaboration with Care Team: 5 minutes, not included in above time.    PT Visit Number: 1    Consult received for [name] for PT Evaluation and Treatment 2/2 change in functional mobility, gait impairment.  Patient's medical condition is appropriate for Physical therapy intervention at this time.    Activity Orders:  Bedrest with bathroom privileges for 24 hr (beginning 09/15/17 @ 1949), Up as tolerated     Precautions and Contraindications: BP goals - SPB: 140-220, DBP: 70-120       Medical Diagnosis:  Chest pressure [R07.89]  Right arm weakness [R29.898]  Acute right-sided low back pain, with sciatica presence unspecified [M54.5]    History of Present Illness:  [name] Cook is a 42 y.o. female admitted on 09/15/2017 with a primary hospital problem of right arm weakness after presenting with R sided numbness and weakness ongoing multiple days, chest pain, and back pain.      Patient Active Problem List   Diagnosis   . Chest pain   . Precordial pain   . Stroke-like symptoms   . Seizures   . Facial droop   . Tension type headache   . History of CVA (cerebrovascular accident)   . Morbid obesity   . Hematemesis   . Blood loss anemia   . HLD (hyperlipidemia)   . Numbness and tingling   . Hemorrhoids   . Right arm weakness       Past Medical/Surgical History:  Past Medical History:   Diagnosis Date   . Hypercholesteremia    . Hypertension    . Seizures    . Stroke     2015 and 2017, right side   . TIA (transient ischemic attack) 2017     Past Surgical History:   Procedure Laterality Date   . APPENDECTOMY     . CHOLECYSTECTOMY     . EGD, BIOPSY N/A 04/16/2017    Procedure: EGD, BIOPSY;  Surgeon: Pershing Proud,  MD;  Location: [facility];  Service: Gastroenterology;  Laterality: N/A;       X-Rays/Tests/Labs:  Lab Results   Component Value Date/Time    HGB 8.5 (L) 09/16/2017 03:38 AM    HCT 29.7 (L) 09/16/2017 03:38 AM    K 3.5 09/15/2017 02:30 PM    NA 141 09/15/2017 02:30 PM    INR 1.0 09/15/2017 02:30 PM    TROPI <0.01 09/16/2017 03:38 AM    TROPI <0.01 09/15/2017 09:28 PM    TROPI <0.01 09/15/2017 02:30 PM     All imaging reviewed, please see chart for details.    Social History:  Prior Level of Function  Prior level of function: Independent with ADLs, Ambulates independently  Baseline Activity Level: Community ambulation  DME Currently at Home:  (none)    Home Living Arrangements  Living Arrangements: Spouse/significant other, Family members  Home Layout: Two level, Bed/bath upstairs, 1/2 bath on main level, Stairs to enter with rails (add number in comment) (10 STE)  DME Currently at Home:  (none)  Home Living - Notes / Comments: Pt notes that she would have assist upon d/c if needed  Subjective:  Patient is agreeable to participation in the therapy session. Nursing clears patient for therapy.  Patient Goal: To get well.  Pain Assessment  Pain Assessment: No/denies pain ("They just gave me pain meds"; pt only c/o dizziness)      Objective:  Observation of Patient/Vital Signs:  VSS T/o session.  Blood pressure 159/90, pulse 66, SpO2 100 %.     Patient received in bed with telemetry  in place.    Cognitive Status and Neuro Exam:  Cognition/Neuro Status  Arousal/Alertness: Appropriate responses to stimuli  Safety Awareness: independent  Behavior: cooperative;calm;attentive    Musculoskeletal Examination  Gross ROM  Right Lower Extremity ROM: within functional limits  Left Lower Extremity ROM: within functional limits  Gross Strength  Right Lower Extremity Strength: within functional limits  Left Lower Extremity Strength: within functional limits       Functional Mobility:  Functional Mobility  Supine to Sit: Modified  Independent;HOB raised;Increased Time;Increased Effort  Scooting to EOB: Independent;Increased Time;Increased Effort  Sit to Supine: Modified Independent (HOB elevated)  Sit to Stand: Modified Independent;Increased Time;Increased Effort (use of UEs)  Stand to Sit: Modified Independent (use of UEs)        Locomotion  Ambulation: Supervision;Independent (supervision x2 feet -> independent x68 feet)  Pattern: decreased step length;decreased cadence (2/2 dizziness, no LOB or safety concerns)     Balance  Balance  Balance: needs focused assessment  Sitting - Static: Good  Sitting - Dynamic: Good  Standing - Static: Good  Standing - Dynamic:  (minus without AD -> good without AD)    Participation and Activity Tolerance  Participation and Endurance  Participation Effort: excellent  Endurance: Endurance does not limit participation in activity  [place] Dyspnea Scale: 0 Dyspnea    Educated the patient to role of physical therapy, plan of care, goals of therapy and HEP, safety with mobility and ADLs, energy conservation techniques, pursed lip breathing, discharge instructions, home safety, importance of continued ambulation, importance of sitting OOB in chair, benefits of continued mobility, benefits of limiting head movements and quick turns 2/2 dizziness, and use of call bell or telephone as needed with pt verbalized understanding.   Discussed d/c and patients ability to safely perform all necessary activities upon d/c with pt noting confidence in her ability including performance of stairs and other activities; pt reports only limiting factor is dizziness and she otherwise would be at baseline.     Patient left in bed (declines chair) with all medical equipment in place and call bell and all personal items/needs within reach. RN notified of session outcome.  Pt left without additional needs or questions.       Assessment:  [name] Cook is a 42 y.o. female admitted 09/15/2017. Patient is at/near baseline and has no  inpatient skilled PT needs at this time. There are no comorbidities or other factors that affect plan of care and require modification of task. Pt's functional mobility is not impacted at this time.   Standardized tests and exams incorporated into evaluation include AMPAC mobility, balance, ROM  and Strength.   Pt demonstrates a stable clinical presentation.      Complexity Level Hx and Co-  morbidites Examination Clinical Decision Making Clinical Presentation   Low no impact 1-2 elements Limited options Stable       Plan:  D/C inpatient PT    PMP Activity: Step 7 - Walks out of Room  Distance Walked (ft) (Step 6,7): 70 Feet  AM-PAC:yes        PT Basic Mobility Raw Score: 24  CMS 0-100% Score: 0.00%             Goals: N/A        DME Recommended for Discharge:  (none needed)  Discharge Recommendation: Home with supervision      Roosevelt Locks, PT, DPT [phone]  09/16/2017 10:26 AM

## 2017-09-16 NOTE — Plan of Care (Addendum)
Problem: Safety  Goal: Patient will be free from injury during hospitalization  Outcome: Progressing   09/15/17 2000   Goal/Interventions addressed this shift   Patient will be free from injury during hospitalization  Assess patient's risk for falls and implement fall prevention plan of care per policy;Provide and maintain safe environment;Hourly rounding       Problem: Pain  Goal: Pain at adequate level as identified by patient  Outcome: Progressing   09/15/17 2000   Goal/Interventions addressed this shift   Pain at adequate level as identified by patient Identify patient comfort function goal;Assess for risk of opioid induced respiratory depression, including snoring/sleep apnea. Alert healthcare team of risk factors identified.;Reassess pain within 30-60 minutes of any procedure/intervention, per Pain Assessment, Intervention, Reassessment (AIR) Cycle;Evaluate if patient comfort function goal is met       Problem: Day of Admission - Stroke  Goal: Core/Quality measure requirements - Admission  Outcome: Progressing   09/15/17 2000   Goal/Interventions addressed this shift   Core/Quality measure requirements - Admission Document NIH Stroke Scale on admission;VTE Prevention: Ensure anticoagulant(s) administered and/or anti-embolism stockings/devices documented as ordered;Document nursing swallow/dysphagia screen on admission. If patient fails, keep patient NPO (follow your hospital protocol on swallowing screening).;Ensure antithrombotic administered or contraindication documented by LIP;If diagnosis or history of Atrial Fib/Atrial Flutter, ensure oral anticoagulation is initiated or contraindication documented by LIP;Ensure lipid panel ordered;Begin stroke education on admission (must include Modifiable Risk Factors, Warning Signs and Symptoms of Stroke, Activation of Emergency Medical System and Follow-up Appointments) Ensure handout has been given and documented.;Ensure PT/OT and/or SLP ordered       Comments:             42 yo BF admitted from ER  C/o Chest  Pain and back pain. Nausea and some diziness.Pt also had c/o some NT and decreased sensation iIn RA the patient has H/O CVA/ HTN /HLD/ TIA /SEIZ   Plan of care is Telemetry monitoring/Labs as ordered  w/trop levels q 3 hrs X2/VItals/neuro checks q 4 hrs/ MRI (unable to complete in ER/ Pt unable to fit in MRI) Ct scan already done in ER./pain control/safety precautions.Stroke Protocol.

## 2017-09-17 ENCOUNTER — Other Ambulatory Visit: Payer: Medicaid Other

## 2017-09-17 ENCOUNTER — Observation Stay: Payer: Medicaid Other

## 2017-09-17 LAB — BASIC METABOLIC PANEL
Anion Gap: 6 (ref 5.0–15.0)
BUN: 12 mg/dL (ref 7.0–19.0)
CO2: 27 mEq/L (ref 22–29)
Calcium: 8.3 mg/dL — ABNORMAL LOW (ref 8.5–10.5)
Chloride: 105 mEq/L (ref 100–111)
Creatinine: 0.9 mg/dL (ref 0.6–1.0)
Glucose: 103 mg/dL — ABNORMAL HIGH (ref 70–100)
Potassium: 3.9 mEq/L (ref 3.5–5.1)
Sodium: 138 mEq/L (ref 136–145)

## 2017-09-17 LAB — CBC
Absolute NRBC: 0 10*3/uL (ref 0.00–0.00)
Hematocrit: 29 % — ABNORMAL LOW (ref 34.7–43.7)
Hgb: 8.2 g/dL — ABNORMAL LOW (ref 11.4–14.8)
MCH: 22.2 pg — ABNORMAL LOW (ref 25.1–33.5)
MCHC: 28.3 g/dL — ABNORMAL LOW (ref 31.5–35.8)
MCV: 78.4 fL (ref 78.0–96.0)
MPV: 12.3 fL (ref 8.9–12.5)
Nucleated RBC: 0 /100 WBC (ref 0.0–0.0)
Platelets: 187 10*3/uL (ref 142–346)
RBC: 3.7 10*6/uL — ABNORMAL LOW (ref 3.90–5.10)
RDW: 17 % — ABNORMAL HIGH (ref 11–15)
WBC: 8.62 10*3/uL (ref 3.10–9.50)

## 2017-09-17 LAB — GFR: EGFR: >60

## 2017-09-17 LAB — HEMOLYSIS INDEX: Hemolysis Index: 1 (ref 0–18)

## 2017-09-17 MED ORDER — REGADENOSON 0.4 MG/5ML IV SOLN
INTRAVENOUS | Status: AC
Start: 2017-09-17 — End: 2017-09-17
  Administered 2017-09-17: 12:00:00 0.4 mg
  Filled 2017-09-17: qty 5

## 2017-09-17 NOTE — UM Notes (Signed)
7.8.2019  Continued observation review:    Lexiscan part 1 completed.  Part 2 tomorrow.  Tolerated procedure well. No ST changes or arrhythmias.    97.6 F (36.4 C) 62 96 % 18 153/69       Assessment & Plan:  42 yo female with Hx HTN, seizures, prior strokes, TIAs. Pt was admitted yesterday with symptoms of R sided numbness and weakness (ongoing x multiple days) but also with chest pain, back pain.  Cannot fit into MRI - getting stroke work up otherwise though.    CD - negative.    Echocardiogram - pending.    -  ASA + Plavix  -  Await echocardiogram  -  With PMD as outpt - can be outpt open MRI    Hgb 8.2     Hematocrit 29.0     RBC 3.70     MCH 22.2     MCHC 28.3     RDW 17       Glucose 103     Calcium 8.3       Scheduled Meds:  Current Facility-Administered Medications   Medication Dose Route Frequency   . aspirin  81 mg Oral Daily   . atorvastatin  40 mg Oral QHS   . clopidogrel  75 mg Oral Daily   . enoxaparin  40 mg Subcutaneous QHS   . levETIRAcetam  500 mg Oral Q12H SCH   . lisinopril  40 mg Oral Daily     PRN Meds:.acetaminophen, cyclobenzaprine, hydrALAZINE, labetalol, morphine, nitroglycerin, ondansetron     Elfredia Nevins, RN, BSN  Utilization Review Case Manager  Case Management  [facility]   [address]  [place], [place]  [address]  T [phone]  Judie Petit [phone] F [phone]

## 2017-09-17 NOTE — Progress Notes (Signed)
lexiscan part 1 completed.  Part 2 tomorrow.  Tolerated procedure well. No ST changes or arrhythmias.    Donzetta Starch MD  [place]  FSCAI  [facility]  [phone]

## 2017-09-17 NOTE — Progress Notes (Signed)
Progress Note    Date Time: 09/17/17 6:56 AM  Patient Name: [name] Cook  Attending Physician: Clarita Leber, MD      Assessment & Plan:   42 yo female with Hx HTN, seizures, prior strokes, TIAs.  Pt was admitted yesterday with symptoms of R sided numbness and weakness (ongoing x multiple days) but also with chest pain, back pain.  Cannot fit into MRI - getting stroke work up otherwise though.    CD - negative.    Echocardiogram - pending.    -  ASA + Plavix  -  Await echocardiogram  -  With PMD as outpt - can be outpt open MRI    Subjective:   Patient Seen and Examined. The notes from the last 24 hours were reviewed.   Pt without new complaints    Review of Systems:   No headache, eye, ear nose, throat problems; no coughing or wheezing or shortness of breath, No chest pain or orthopnea, no abdominal pain, nausea or vomiting, No pain in the body or extremities, no psychiatric, neurological, endocrine, hematological or cardiac complaints except as noted above.     Physical Exam:   Blood pressure 108/48, pulse 80, temperature 97 F (36.1 C), resp. rate 18, height 1.778 m (5\' 10" ), weight 142 kg (313 lb), last menstrual period 09/13/2017, SpO2 100 %.    Sleepy  Face symmetric  Wants to return to sleep    Meds:      Scheduled Meds: PRN Meds:      aspirin 81 mg Oral Daily   atorvastatin 40 mg Oral QHS   clopidogrel 75 mg Oral Daily   enoxaparin 40 mg Subcutaneous QHS   levETIRAcetam 500 mg Oral Q12H SCH   lisinopril 40 mg Oral Daily       Continuous Infusions:     acetaminophen 650 mg Once PRN   cyclobenzaprine 10 mg TID PRN   hydrALAZINE 10 mg Q3H PRN   labetalol 10 mg Q15 Min PRN   morphine 4 mg Q4H PRN   nitroglycerin 0.4 mg Q5 Min PRN   ondansetron 4 mg Once PRN           I personally reviewed all of the medications    Labs:     Recent Labs  Lab 09/17/17  0539 09/15/17  1430   Glucose 103* 84   BUN 12.0 12.0   Creatinine 0.9 0.9   Calcium 8.3* 9.0   Sodium 138 141   Potassium 3.9 3.5   Chloride 105 107    CO2 27 27   Albumin  --  3.9   AST (SGOT)  --  14   ALT  --  16   Bilirubin, Total  --  0.7   Alkaline Phosphatase  --  88       Recent Labs  Lab 09/17/17  0539 09/16/17  0338 09/15/17  1430   WBC 8.62 7.69 8.29   Hgb 8.2* 8.5* 9.0*   Hematocrit 29.0* 29.7* 31.6*   MCV 78.4 78.0 78.0   MCH 22.2* 22.3* 22.2*   MCHC 28.3* 28.6* 28.5*   Platelets 187 188 213         Recent Labs      09/15/17   1430   PTT  32   PT  13.2   PT INR  1.0          Radiology Results (24 Hour)     Procedure Component Value Units Date/Time    US  CAROTID DUPLEX DOPP COMP [[id number]] Collected:  09/16/17 2125    Order Status:  Completed Updated:  09/16/17 2130    Narrative:       HISTORY: Right-sided weakness and diminished sensation. Obesity,  hypertension, hypercholesterolemia, concern for acute stroke.    TECHNIQUE:  Duplex evaluation of the cerebral vasculature is performed  from the clavicles to the angles of the mandible utilizing gray scale,  gated Doppler and color Doppler techniques. Velocities were used in  reference to NASCET criteria as a determinate of carotid diameter.    INTERPRETATION:  Examination of both left and right carotid arteries  demonstrates normal morphologic appearance without evidence of  significant plaque formation or stenosis.  Peak systolic and  end-diastolic velocity measurements are normal throughout common,  internal and external carotid arteries bilaterally.  No focal elevation  of velocity or turbulence is present to suggest significant stenotic or  occlusive disease.    Bilateral antegrade vertebral artery flow is demonstrated.      Impression:         Normal duplex evaluation of the carotid and vertebral  arteries.    Dimitrios  Papadouris, MD   09/16/2017 9:26 PM    XR Lumbar Spine AP And Lateral [[id number]] Collected:  09/16/17 1124    Order Status:  Completed Updated:  09/16/17 1130    Narrative:       CLINICAL INDICATION: Acute low back pain with no history of trauma,  fever or surgery    COMPARISON: No  pertinent studies    INTERPRETATION: Supine AP, lateral and both oblique views were  performed. L5-S1 is mildly narrowed. There is focal spur without  narrowing at L3-4. The pedicles of the 5 lumbar vertebral bodies are  intact. No fracture, spondylolysis, spondylolisthesis or destructive  process is seen.    There is degenerative change in lower thoracic spine with spur formation  at T12-L1 and T11-12.      Impression:         1. Mild degenerative changes as above    Charlene Brooke, MD   09/16/2017 11:26 AM           All recent brain and spine imaging (MRI, CT) results reviewed.    Code status listed in chart confirmed    Chart reviewed    Case discussed with: patient    35 minutes; >50% time spent in counseling or coordination of care    Signed by: Ardelle Anton, MD  Spectralink: [phone]       Answering Service: [phone]

## 2017-09-17 NOTE — Plan of Care (Signed)
Problem: Every Day - Stroke  Goal: Neurological status is stable or improving  Outcome: Progressing   09/17/17 0142   Goal/Interventions addressed this shift   Neurological status is stable or improving Monitor/assess/document neurological assessment (Stroke: every 4 hours);Monitor/assess NIH Stroke Scale;Re-assess NIH Stroke Scale for any change in status;Observe for seizure activity and initiate seizure precautions if indicated;Perform CAM Assessment     Pt still c/o decreased sensation R side face, RUE, RLE. Pt had weakness RUE and RLE compared to L side. PT states that it isn't any better since admission, but not any worse either. Will cont to monitor.

## 2017-09-17 NOTE — Progress Notes (Signed)
[facility]  PROGRESS NOTE      Patient: [name] Cook  Date: 09/17/2017   LOS: 0 Days  Admission Date: 09/15/2017   MRN: [id number]  Attending: Derek Mound  Please contact me on Perfect Serve     ASSESSMENT/PLAN     [name] is a 42 y.o. female admitted with right arm weakness   Interval Summary:     Patient Active Hospital Problem List:   Right arm weakness (09/15/2017)   Hypertension    Seizures   TIAs    Right sided numbness/weakness     Right arm weakness  - CT head negative for acute processes  - ECHO reviewed  - continue asa and plavix per neuro   - op MRI (pt unable to fit into MRI machine here due to obesity)  - continue lipitor     Hypertension, well controlled  - continue lisinopril     Seizures  - continue keppra     Chest pain, resolved  - cardiology on board, appreciate recs  - lexiscan part 1 done, part 2 to be done tomorrow     Nutrition: cardiac diet   DVT Prophylaxis: lovenox      Code Status: Full  DISPO: TBD       SUBJECTIVE     [name] Cook denies cp, sob. Denies nausea/vomiting. Denies abdominal pain.     MEDICATIONS     Current Facility-Administered Medications   Medication Dose Route Frequency   . aspirin  81 mg Oral Daily   . atorvastatin  40 mg Oral QHS   . clopidogrel  75 mg Oral Daily   . enoxaparin  40 mg Subcutaneous QHS   . levETIRAcetam  500 mg Oral Q12H SCH   . lisinopril  40 mg Oral Daily       PHYSICAL EXAM     Vitals:    09/17/17 1600   BP: 121/78   Pulse: 69   Resp: 18   Temp: 97.9 F (36.6 C)   SpO2: 100%       Temperature: Temp  Min: 96.9 F (36.1 C)  Max: 98.6 F (37 C)  Pulse: Pulse  Min: 58  Max: 83  Respiratory: Resp  Min: 18  Max: 18  Non-Invasive BP: BP  Min: 108/48  Max: 157/85  Pulse Oximetry SpO2  Min: 96 %  Max: 100 %    Intake and Output Summary (Last 24 hours) at Date Time    Intake/Output Summary (Last 24 hours) at 09/17/17 1706  Last data filed at 09/17/17 0400   Gross per 24 hour   Intake              270 ml   Output              240 ml   Net                30 ml       GEN APPEARANCE: Normal;  A&OX3  HEENT: PERLA; EOMI; Conjunctiva Clear  NECK: Supple; No bruits  CVS: RRR, S1, S2; No M/G/R  LUNGS: CTAB; No Wheezes; No Rhonchi: No rales  ABD: Soft; No TTP; + Normoactive BS  EXT: No edema; Pulses 2+ and intact  Skin exam:  pink  NEURO: non focal, moves all extremities   CAP REFILL:  Normal  MENTAL STATUS:  Normal    Exam done by Derek Mound, MD on 09/17/17 at 5:06 PM      LABS  Recent Labs  Lab 09/17/17  0539 09/16/17  0338 09/15/17  1430   WBC 8.62 7.69 8.29   RBC 3.70* 3.81* 4.05   Hgb 8.2* 8.5* 9.0*   Hematocrit 29.0* 29.7* 31.6*   MCV 78.4 78.0 78.0   Platelets 187 188 213         Recent Labs  Lab 09/17/17  0539 09/15/17  1430   Sodium 138 141   Potassium 3.9 3.5   Chloride 105 107   CO2 27 27   BUN 12.0 12.0   Creatinine 0.9 0.9   Glucose 103* 84   Calcium 8.3* 9.0         Recent Labs  Lab 09/15/17  1430   ALT 16   Cook (SGOT) 14   Bilirubin, Total 0.7   Albumin 3.9   Alkaline Phosphatase 88         Recent Labs  Lab 09/16/17  0338 09/15/17  2128 09/15/17  1430   Troponin I <0.01 <0.01 <0.01         Recent Labs  Lab 09/15/17  1430   PT INR 1.0   PT 13.2   PTT 32       Microbiology Results     None           RADIOLOGY     Xr Lumbar Spine Ap And Lateral    Result Date: 09/16/2017  1. Mild degenerative changes as above [name] Brooke, MD 09/16/2017 11:26 AM    Ct Head Wo Contrast    Result Date: 09/15/2017  No CT evidence of an acute intracranial abnormality. [name] Ast, MD 09/15/2017 3:46 PM    Xr Chest  Ap Portable    Result Date: 09/15/2017  No acute thoracic process. [name] Ast, MD 09/15/2017 2:44 PM    US Carotid Duplex Dopp Comp    Result Date: 09/16/2017    Normal duplex evaluation of the carotid and vertebral arteries. [name]  Papadouris, MD 09/16/2017 9:26 PM      Signed,  [name] Cook  5:06 PM 09/17/2017

## 2017-09-18 ENCOUNTER — Observation Stay: Payer: Medicaid Other

## 2017-09-18 LAB — ECG 12-LEAD
Atrial Rate: 74 {beats}/min
P Axis: 52 degrees
P-R Interval: 150 ms
Q-T Interval: 416 ms
QRS Duration: 80 ms
QTC Calculation (Bezet): 461 ms
R Axis: 2 degrees
T Axis: -26 degrees
Ventricular Rate: 74 {beats}/min

## 2017-09-18 LAB — CBC
Absolute NRBC: 0 10*3/uL (ref 0.00–0.00)
Absolute NRBC: 0 10*3/uL (ref 0.00–0.00)
Hematocrit: 32.5 % — ABNORMAL LOW (ref 34.7–43.7)
Hematocrit: 32.9 % — ABNORMAL LOW (ref 34.7–43.7)
Hgb: 9.2 g/dL — ABNORMAL LOW (ref 11.4–14.8)
Hgb: 9.4 g/dL — ABNORMAL LOW (ref 11.4–14.8)
MCH: 22.3 pg — ABNORMAL LOW (ref 25.1–33.5)
MCH: 22.3 pg — ABNORMAL LOW (ref 25.1–33.5)
MCHC: 28.3 g/dL — ABNORMAL LOW (ref 31.5–35.8)
MCHC: 28.6 g/dL — ABNORMAL LOW (ref 31.5–35.8)
MCV: 78.9 fL (ref 78.0–96.0)
MCV: 78.1 fL (ref 78.0–96.0)
MPV: 12.5 fL (ref 8.9–12.5)
MPV: 12.1 fL (ref 8.9–12.5)
Nucleated RBC: 0 /100 WBC (ref 0.0–0.0)
Nucleated RBC: 0 /100 WBC (ref 0.0–0.0)
Platelets: 216 10*3/uL (ref 142–346)
Platelets: 199 10*3/uL (ref 142–346)
RBC: 4.12 10*6/uL (ref 3.90–5.10)
RBC: 4.21 10*6/uL (ref 3.90–5.10)
RDW: 17 % — ABNORMAL HIGH (ref 11–15)
RDW: 17 % — ABNORMAL HIGH (ref 11–15)
WBC: 7.79 10*3/uL (ref 3.10–9.50)
WBC: 7.81 10*3/uL (ref 3.10–9.50)

## 2017-09-18 LAB — BASIC METABOLIC PANEL
Anion Gap: 9 (ref 5.0–15.0)
Anion Gap: 8 (ref 5.0–15.0)
BUN: 14 mg/dL (ref 7.0–19.0)
BUN: 14 mg/dL (ref 7.0–19.0)
CO2: 26 mEq/L (ref 22–29)
CO2: 26 mEq/L (ref 22–29)
Calcium: 8.6 mg/dL (ref 8.5–10.5)
Calcium: 8.7 mg/dL (ref 8.5–10.5)
Chloride: 103 mEq/L (ref 100–111)
Chloride: 104 mEq/L (ref 100–111)
Creatinine: 0.9 mg/dL (ref 0.6–1.0)
Creatinine: 0.9 mg/dL (ref 0.6–1.0)
Glucose: 90 mg/dL (ref 70–100)
Glucose: 103 mg/dL — ABNORMAL HIGH (ref 70–100)
Potassium: 4 mEq/L (ref 3.5–5.1)
Potassium: 4.2 mEq/L (ref 3.5–5.1)
Sodium: 138 mEq/L (ref 136–145)
Sodium: 138 mEq/L (ref 136–145)

## 2017-09-18 LAB — HEMOLYSIS INDEX
Hemolysis Index: 3 (ref 0–18)
Hemolysis Index: 4 (ref 0–18)

## 2017-09-18 LAB — IHS D-DIMER: D-Dimer: 0.44 ug/mL FEU (ref 0.00–0.50)

## 2017-09-18 LAB — GFR
EGFR: >60
EGFR: >60

## 2017-09-18 LAB — TROPONIN I: Troponin I: <0.01 ng/mL (ref 0.00–0.09)

## 2017-09-18 MED ORDER — CLOPIDOGREL BISULFATE 75 MG PO TABS
75.00 mg | ORAL_TABLET | Freq: Every day | ORAL | 0 refills | Status: DC
Start: 2017-09-19 — End: 2018-09-06

## 2017-09-18 MED ORDER — ASPIRIN 81 MG PO CHEW
81.00 mg | CHEWABLE_TABLET | Freq: Every day | ORAL | 0 refills | Status: DC
Start: 2017-09-19 — End: 2022-07-01

## 2017-09-18 MED ORDER — TECHNETIUM TC 99M TETROFOSMIN IV KIT
31.00 | PACK | Freq: Once | INTRAVENOUS | Status: AC | PRN
Start: 2017-09-18 — End: 2017-09-18
  Administered 2017-09-18: 09:00:00 31 via INTRAVENOUS
  Filled 2017-09-18: qty 100

## 2017-09-18 MED ORDER — BISACODYL 10 MG RE SUPP
10.00 mg | Freq: Once | RECTAL | Status: DC
Start: 2017-09-18 — End: 2017-09-20
  Filled 2017-09-18: qty 1

## 2017-09-18 MED ORDER — MORPHINE SULFATE 2 MG/ML IJ/IV SOLN (WRAP)
2.0000 mg | Freq: Once | Status: AC
Start: 2017-09-18 — End: 2017-09-18
  Administered 2017-09-18: 09:00:00 2 mg via INTRAVENOUS
  Filled 2017-09-18: qty 1

## 2017-09-18 MED ORDER — MORPHINE SULFATE 2 MG/ML IJ/IV SOLN (WRAP)
2.0000 mg | Status: DC | PRN
Start: 2017-09-18 — End: 2017-09-20
  Administered 2017-09-18 – 2017-09-19 (×7): 2 mg via INTRAVENOUS
  Filled 2017-09-18 (×8): qty 1

## 2017-09-18 MED ORDER — POLYETHYLENE GLYCOL 3350 17 G PO PACK
17.00 g | PACK | Freq: Every day | ORAL | Status: DC
Start: 2017-09-18 — End: 2017-09-20
  Filled 2017-09-18: qty 1

## 2017-09-18 MED ORDER — TECHNETIUM TC 99M TETROFOSMIN IV KIT
29.10 | PACK | Freq: Once | INTRAVENOUS | Status: AC | PRN
Start: 2017-09-17 — End: 2017-09-17
  Administered 2017-09-17: 13:00:00 29.1 via INTRAVENOUS
  Filled 2017-09-18: qty 100

## 2017-09-18 MED ORDER — OXYCODONE HCL 5 MG PO TABS
5.00 mg | ORAL_TABLET | Freq: Four times a day (QID) | ORAL | Status: DC | PRN
Start: 2017-09-18 — End: 2017-09-20
  Administered 2017-09-19 – 2017-09-20 (×2): 5 mg via ORAL
  Filled 2017-09-18 (×4): qty 1

## 2017-09-18 NOTE — Progress Notes (Addendum)
Morphine order expired last night. Hospitals on call informed. Awaiting for orders. NO new orders made.  Patient checked at 0344 and was asleep. Came back at 0443 to offer Flexeril, (no new orders made by MD) but patient was still asleep. Patient refused Flexeril she said it is not working.

## 2017-09-18 NOTE — Plan of Care (Signed)
Problem: Safety  Goal: Patient will be free from injury during hospitalization  Outcome: Progressing   09/18/17 1311   Goal/Interventions addressed this shift   Patient will be free from injury during hospitalization  Assess patient's risk for falls and implement fall prevention plan of care per policy;Ensure appropriate safety devices are available at the bedside;Provide and maintain safe environment       Problem: Pain  Goal: Pain at adequate level as identified by patient  Outcome: Progressing   09/18/17 1311   Goal/Interventions addressed this shift   Pain at adequate level as identified by patient Identify patient comfort function goal;Assess pain on admission, during daily assessment and/or before any "as needed" intervention(s);Reassess pain within 30-60 minutes of any procedure/intervention, per Pain Assessment, Intervention, Reassessment (AIR) Cycle       Problem: Every Day - Stroke  Goal: Core/Quality measure requirements - Daily  Outcome: Progressing   09/18/17 1311   Goal/Interventions addressed this shift   Core/Quality measure requirements - Daily  VTE Prevention: Ensure anticoagulant(s) administered and/or anti-embolism stockings/devices documented by end of day 2;Once lipid panel has resulted, check LDL. Contact provider for statin order if LDL > 70 (or ensure contraindication documented by LIP).;Continue stroke education (must include Modifiable Risk Factors, Warning Signs and Symptoms of Stroke, Activation of Emergency Medical System and Follow-up Appointments). Ensure handout has been given and documented.     Goal: Neurological status is stable or improving  Outcome: Progressing   09/18/17 1311   Goal/Interventions addressed this shift   Neurological status is stable or improving Monitor/assess/document neurological assessment (Stroke: every 4 hours);Monitor/assess NIH Stroke Scale;Re-assess NIH Stroke Scale for any change in status;Perform CAM Assessment     Goal: Stable vital signs and fluid  balance  Outcome: Progressing   09/18/17 1311   Goal/Interventions addressed this shift   Stable vital signs and fluid balance  Position patient for maximum circulation/cardiac output;Monitor and assess vitals every 4 hours or as ordered and hemodynamic parameters;Monitor intake and output. Notify LIP if urine output is < 30 mL/hour.;Encourage oral fluid intake       Comments: Pt is AOx4, NSR, RA. Pt c/o of chest pain that radiates to lower back. Pt offered Flexeril but denied because it "doesn't work." MD paged to ask for another medicine. MD ordered Oxycodone. Will administer once verified by pharmacy. Trop and D-dimer drawn to address chest and back pain to r/o PE. If D-dimer is positive, plan for CTA of chest. Pt had stress test today, not yet resulted. Pt on stroke pathway, neuro checks being done q4hrs, continued weakness and decreased sensation to right side, no changes among assessments. Will continue to monitor.

## 2017-09-18 NOTE — Discharge Instr - AVS First Page (Signed)
Reason for your Hospital Admission:  Chest pressure       Instructions for after your discharge:  See your PCP this week for hospital   Seek immediate medical attention in case of worsening symptoms such as chest pain, shortness of breath, blurry vision, headaches, dizziness/lightheadedness, fevers/chills

## 2017-09-18 NOTE — UM Notes (Signed)
7.9.2019  Continued observation review:    97.2 F (36.2 C)  53 100 % 18 109/53     Hgb 9.2     Hematocrit 32.5     MCH 22.3     MCHC 28.3       Stress text:    Normal stress and rest myocardial perfusion study without evidence of inducible ischemia or scar.     Normal left ventricular function with calculated EF 66%.     Breast attenuation is present     Scheduled Meds:  Current Facility-Administered Medications   Medication Dose Route Frequency   . aspirin  81 mg Oral Daily   . atorvastatin  40 mg Oral QHS   . bisacodyl  10 mg Rectal Once   . clopidogrel  75 mg Oral Daily   . enoxaparin  40 mg Subcutaneous QHS   . levETIRAcetam  500 mg Oral Q12H SCH   . lisinopril  40 mg Oral Daily   . polyethylene glycol  17 g Oral Daily     PRN Meds:.cyclobenzaprine, hydrALAZINE, labetalol, morphine, nitroglycerin, ondansetron, oxyCODONE    Elfredia Nevins, RN, BSN  Utilization Review Case Manager  Case Management  [facility]   [address]  [place], [place]  [address]  T [phone]  Judie Petit [phone] F [phone]

## 2017-09-18 NOTE — Progress Notes (Signed)
Pt now has scheduled Morphine 2 mg IVPush q4hrs PRN for chest pain that radiates to lower back. Pt has multiple drug allergies so pt and Dr. Merlene Laughter agreed upon IV Morphine for now to manage chest and back pain. Pt reports that Morphine is effective in alleviating pain and has been notified of when the next dose can be administered. Will continue to monitor.

## 2017-09-18 NOTE — Progress Notes (Signed)
Trop does not need to be redone per MD.

## 2017-09-18 NOTE — Plan of Care (Signed)
Problem: Every Day - Stroke  Goal: Neurological status is stable or improving  Outcome: Progressing  Alert and oriented x4. Neurocheck done Q 4hrs. Still weak on the RUE and RLE.  NSR on the monitor. Pain is on the chest that radiates to the back. Patient said this is new, but it looks like this is one of her complaints when she was admitted. Hospitalist on call did not renew the Morphine order. Went to the room twice to give Flexeril but has been sleeping. NPO from MN for repeat stress test today, will "redo the pictures."  Needs Morphine IV 1 hr before test per RN shift report.  Will let MD and RN know in AM.

## 2017-09-18 NOTE — Progress Notes (Signed)
Echocardiogram negative.  Can be discharged on ASA + Plavix.  Arrange for open MRI with PMD as outpt.

## 2017-09-18 NOTE — Progress Notes (Signed)
[facility]  PROGRESS NOTE      Patient: [name] Cook  Date: 09/18/2017   LOS: 0 Days  Admission Date: 09/15/2017   MRN: [id number]  Attending: Derek Mound  Please contact me on Perfect Serve     ASSESSMENT/PLAN     [name] is a 42 y.o. female admitted with right arm weakness   Interval Summary:     Patient Active Hospital Problem List:   Right arm weakness (09/15/2017)   Hypertension    Seizures   TIAs    Right sided numbness/weakness     Right arm weakness/numbness  - CT head negative for acute processes  - ECHO reviewed  - continue asa and plavix per neuro   - op MRI (pt unable to fit into MRI machine here due to obesity)  - continue lipitor     Hypertension, well controlled  - continue lisinopril     Seizures  - continue keppra     Chest pain, resolved initially today c/o again  - cardiology on board, appreciate recs  - stress test negative   - d dimer negative  - discussed pursuing TEE for pt with Dr Georgeanna Lea, he recommends that we monitor pt for now overnight, which I agree with, low suspicion of aortic dissection. Vitals are stable.   - cannot pursue CT imaging due to IV contrast allergy. Cannot pursue MRA study due to morbid obesity    Nutrition: cardiac diet   DVT Prophylaxis: lovenox      Code Status: Full  DISPO: TBD       SUBJECTIVE     [name] reports chest pain, radiating to shoulder and back. She denies nausea/vomiting. Denies sob. Tolerating PO diet well.     MEDICATIONS     Current Facility-Administered Medications   Medication Dose Route Frequency   . aspirin  81 mg Oral Daily   . atorvastatin  40 mg Oral QHS   . bisacodyl  10 mg Rectal Once   . clopidogrel  75 mg Oral Daily   . enoxaparin  40 mg Subcutaneous QHS   . levETIRAcetam  500 mg Oral Q12H SCH   . lisinopril  40 mg Oral Daily   . polyethylene glycol  17 g Oral Daily       PHYSICAL EXAM     Vitals:    09/18/17 1200   BP: 125/68   Pulse: 73   Resp: 18   Temp: 98.1 F (36.7 C)   SpO2: 100%       Temperature: Temp  Min: 97.2  F (36.2 C)  Max: 98.1 F (36.7 C)  Pulse: Pulse  Min: 53  Max: 78  Respiratory: Resp  Min: 18  Max: 18  Non-Invasive BP: BP  Min: 109/53  Max: 137/77  Pulse Oximetry SpO2  Min: 99 %  Max: 100 %    Intake and Output Summary (Last 24 hours) at Date Time    Intake/Output Summary (Last 24 hours) at 09/18/17 1623  Last data filed at 09/17/17 2100   Gross per 24 hour   Intake              100 ml   Output              300 ml   Net             -200 ml       GEN APPEARANCE: Normal;  A&OX3  HEENT: PERLA; EOMI; Conjunctiva Clear  NECK: Supple;  No bruits  CVS: RRR, S1, S2; No M/G/R  LUNGS: CTAB; No Wheezes; No Rhonchi: No rales  ABD: Soft; No TTP; + Normoactive BS  EXT: No edema; Pulses 2+ and intact  Skin exam:  pink  NEURO: non focal, moves all extremities   CAP REFILL:  Normal  MENTAL STATUS:  Normal      LABS       Recent Labs  Lab 09/18/17  0704 09/18/17  0326 09/17/17  0539   WBC 7.81 7.79 8.62   RBC 4.21 4.12 3.70*   Hgb 9.4* 9.2* 8.2*   Hematocrit 32.9* 32.5* 29.0*   MCV 78.1 78.9 78.4   Platelets 199 216 187         Recent Labs  Lab 09/18/17  0704 09/18/17  0326 09/17/17  0539 09/15/17  1430   Sodium 138 138 138 141   Potassium 4.2 4.0 3.9 3.5   Chloride 104 103 105 107   CO2 26 26 27 27    BUN 14.0 14.0 12.0 12.0   Creatinine 0.9 0.9 0.9 0.9   Glucose 103* 90 103* 84   Calcium 8.7 8.6 8.3* 9.0         Recent Labs  Lab 09/15/17  1430   ALT 16   AST (SGOT) 14   Bilirubin, Total 0.7   Albumin 3.9   Alkaline Phosphatase 88         Recent Labs  Lab 09/18/17  1257 09/16/17  0338 09/15/17  2128   Troponin I <0.01 <0.01 <0.01         Recent Labs  Lab 09/15/17  1430   PT INR 1.0   PT 13.2   PTT 32       Microbiology Results     None           RADIOLOGY     Xr Lumbar Spine Ap And Lateral    Result Date: 09/16/2017  1. Mild degenerative changes as above Charlene Brooke, MD 09/16/2017 11:26 AM    Ct Head Wo Contrast    Result Date: 09/15/2017  No CT evidence of an acute intracranial abnormality. Aldean Ast, MD 09/15/2017 3:46 PM    Xr  Chest Ap Portable    Result Date: 09/18/2017   Minor bibasilar atelectasis.Filbert Schilder, MD 09/18/2017 2:09 PM    Xr Chest  Ap Portable    Result Date: 09/15/2017  No acute thoracic process. Aldean Ast, MD 09/15/2017 2:44 PM    US Carotid Duplex Dopp Comp    Result Date: 09/16/2017    Normal duplex evaluation of the carotid and vertebral arteries. Dimitrios  Papadouris, MD 09/16/2017 9:26 PM      Signed,  Derek Mound  4:23 PM 09/18/2017

## 2017-09-18 NOTE — Progress Notes (Addendum)
[place]  PROGRESS NOTE  [facility]     Date Time: 09/18/17 5:23 PM  Patient Name: [name] Cook, [name] Cook     Patient Active Problem List   Diagnosis   . Chest pain   . Precordial pain   . Stroke-like symptoms   . Seizures   . Facial droop   . Tension type headache   . History of CVA (cerebrovascular accident)   . Morbid obesity   . Hematemesis   . Blood loss anemia   . HLD (hyperlipidemia)   . Numbness and tingling   . Hemorrhoids   . Right arm weakness     Assessment:   Chest pain    Low risk nuclear study breast attenuation artifact with otherwise normal perfusion normal LV function    History of stroke    History of hematemesis    Anaphylaxis with IV contrast she describes even in the setting of premedication with steroids    Persistent chest pain with normal EKG normal cardiac enzymes perfusion study as above        Recommendations:      Further evaluation options are somewhat limited by her contrast allergy inability to have an MRI at this facility due to size      Primary physician brought up the possibility of a TEE to evaluate for aortic pathology.  given her history of hematemesis she would not need an EGD prior.   with normal chest x-ray equal pulses bilaterally persistence of symptoms aortic dissection is unlikely but not impossible will observe overnight n.p.o. after midnight and will consider further evaluation in the morning      Subjective:   Persistent chest pain    Medications:      Scheduled Meds: PRN Meds:      aspirin 81 mg Oral Daily   atorvastatin 40 mg Oral QHS   bisacodyl 10 mg Rectal Once   clopidogrel 75 mg Oral Daily   enoxaparin 40 mg Subcutaneous QHS   levETIRAcetam 500 mg Oral Q12H SCH   lisinopril 40 mg Oral Daily   polyethylene glycol 17 g Oral Daily       Continuous Infusions:     cyclobenzaprine 10 mg TID PRN   hydrALAZINE 10 mg Q3H PRN   labetalol 10 mg Q15 Min PRN   morphine 2 mg Q4H PRN   nitroglycerin 0.4 mg Q5 Min PRN   ondansetron 4 mg Once PRN   oxyCODONE 5 mg Q6H  PRN         Home Meds:   Prescriptions Prior to Admission   Medication Sig Dispense Refill Last Dose   . atorvastatin (LIPITOR) 40 MG tablet Take by mouth.   09/15/2017 at 1200   . LevETIRAcetam (KEPPRA PO) Take 500 mg by mouth 2 (two) times daily.      09/15/2017 at 0800   . lisinopril (PRINIVIL,ZESTRIL) 40 MG tablet Take 40 mg by mouth daily.   09/15/2017 at 1200   . lidocaine (LIDODERM) 5 % Place 1 patch onto the skin daily. Remove & Discard patch within 12 hours or as directed by MD 6 each 0 Unknown at Unknown time   . nitroglycerin (NITROSTAT) 0.4 MG SL tablet Place under the tongue.   Unknown at Unknown time          Physical Exam:   BP 104/58   Pulse (!) 57   Temp 98 F (36.7 C) (Oral)   Resp 18   Ht 1.778 m (5\' 10" )   Wt 142.3  kg (313 lb 12.8 oz)   LMP 09/13/2017   SpO2 99%   BMI 45.03 kg/m    O2 Device: None (Room air)    Temp (24hrs), Avg:97.6 F (36.4 C), Min:97.2 F (36.2 C), Max:98.1 F (36.7 C)      Telemetry reviewed      Intake and Output Summary (Last 24 hours) at Date Time    Intake/Output Summary (Last 24 hours) at 09/18/17 1723  Last data filed at 09/17/17 2100   Gross per 24 hour   Intake              100 ml   Output              300 ml   Net             -200 ml       General Appearance:  Breathing comfortable, no acute distress  Head:  normocephalic  Eyes:  EOM's intact  Neck:  No carotid bruit or jugular venous distension, brisk carotid upstroke  Lungs:  Clear to auscultation throughout, no wheezes, rhonchi or rales, good respiratory effort   Chest Wall:  No tenderness or deformity  Heart:  S1 S2 normal No S3 or S4, without murmur/rub.  PMI non displaced   Abdomen:  Soft, non-tender, positive bowel sounds, no hepatojugular reflux  Extremities:  No cyanosis, clubbing or edema  Pulses:  2+ pedal, radial, brachial pulses equal bilateraly  Neurologic:  Alert and oriented x3, mood and affect normal  Musculoskeletal: normal strength and tone    Labs:     Recent Labs  Lab 09/18/17  1257  09/16/17  0338 09/15/17  2128   Troponin I <0.01 <0.01 <0.01       Recent Labs  Lab 09/15/17  1430   Bilirubin, Total 0.7   Protein, Total 7.9   Albumin 3.9   ALT 16   AST (SGOT) 14           Recent Labs  Lab 09/15/17  1430   PT 13.2   PT INR 1.0   PTT 32       Recent Labs  Lab 09/18/17  0704 09/18/17  0326 09/17/17  0539   WBC 7.81 7.79 8.62   Hgb 9.4* 9.2* 8.2*   Hematocrit 32.9* 32.5* 29.0*   Platelets 199 216 187       Recent Labs  Lab 09/18/17  0704 09/18/17  0326 09/17/17  0539   Sodium 138 138 138   Potassium 4.2 4.0 3.9   Chloride 104 103 105   CO2 26 26 27    BUN 14.0 14.0 12.0   Creatinine 0.9 0.9 0.9   EGFR >60.0 >60.0 >60.0   Glucose 103* 90 103*   Calcium 8.7 8.6 8.3*       Lab Results   Component Value Date    BNP 15 06/18/2015       Weight Monitoring 04/16/2017 04/17/2017 09/15/2017 09/15/2017 09/16/2017 09/17/2017 09/18/2017   Height 177.8 cm - 177.8 cm 177.8 cm - - -   Height Method Stated - Stated Stated - - -   Weight 137.44 kg 139.254 kg 121.11 kg 141.432 kg 141.976 kg 142.157 kg 142.339 kg   Weight Method Stated Standing Scale - Standing Scale Standing Scale Standing Scale Standing Scale   BMI (calculated) 43.6 kg/m2 - 38.4 kg/m2 44.8 kg/m2 - - -         Imaging:   Radiological Procedure  Signed by: Tarri Glenn, MD      [facility]  NP [place] (8am-5pm)  MD Spectralink [phone] (8am-5pm)  After hours, non urgent consult line [phone]  After Hours, urgent consults [phone]

## 2017-09-18 NOTE — Progress Notes (Signed)
Called re recurrent chest pain   Low risk nulcear study earlier today.  Repeat ekg if unchanged consider alternative etiologies ie gi,  Discussed with Dr. Merlene Laughter

## 2017-09-19 ENCOUNTER — Observation Stay: Payer: Medicaid Other

## 2017-09-19 LAB — ECG 12-LEAD
Atrial Rate: 63 {beats}/min
P Axis: 46 degrees
P-R Interval: 154 ms
Q-T Interval: 432 ms
QRS Duration: 88 ms
QTC Calculation (Bezet): 442 ms
R Axis: 21 degrees
T Axis: 5 degrees
Ventricular Rate: 63 {beats}/min

## 2017-09-19 LAB — BASIC METABOLIC PANEL
Anion Gap: 7 (ref 5.0–15.0)
BUN: 17 mg/dL (ref 7.0–19.0)
CO2: 25 mEq/L (ref 22–29)
Calcium: 8.5 mg/dL (ref 8.5–10.5)
Chloride: 105 mEq/L (ref 100–111)
Creatinine: 0.9 mg/dL (ref 0.6–1.0)
Glucose: 111 mg/dL — ABNORMAL HIGH (ref 70–100)
Potassium: 3.9 mEq/L (ref 3.5–5.1)
Sodium: 137 mEq/L (ref 136–145)

## 2017-09-19 LAB — CBC
Absolute NRBC: 0 10*3/uL (ref 0.00–0.00)
Hematocrit: 31.4 % — ABNORMAL LOW (ref 34.7–43.7)
Hgb: 9 g/dL — ABNORMAL LOW (ref 11.4–14.8)
MCH: 22.2 pg — ABNORMAL LOW (ref 25.1–33.5)
MCHC: 28.7 g/dL — ABNORMAL LOW (ref 31.5–35.8)
MCV: 77.3 fL — ABNORMAL LOW (ref 78.0–96.0)
MPV: 12.5 fL (ref 8.9–12.5)
Nucleated RBC: 0 /100 WBC (ref 0.0–0.0)
Platelets: 214 10*3/uL (ref 142–346)
RBC: 4.06 10*6/uL (ref 3.90–5.10)
RDW: 17 % — ABNORMAL HIGH (ref 11–15)
WBC: 8.66 10*3/uL (ref 3.10–9.50)

## 2017-09-19 LAB — HEMOLYSIS INDEX: Hemolysis Index: 1 (ref 0–18)

## 2017-09-19 LAB — GFR: EGFR: >60

## 2017-09-19 MED ORDER — OXYCODONE HCL 5 MG PO TABS
5.00 mg | ORAL_TABLET | Freq: Four times a day (QID) | ORAL | 0 refills | Status: AC | PRN
Start: 2017-09-19 — End: 2017-09-22

## 2017-09-19 MED ORDER — DIPHENHYDRAMINE HCL 50 MG/ML IJ SOLN
25.00 mg | Freq: Four times a day (QID) | INTRAMUSCULAR | Status: DC | PRN
Start: 2017-09-19 — End: 2017-09-20
  Administered 2017-09-19 – 2017-09-20 (×2): 25 mg via INTRAVENOUS
  Filled 2017-09-19 (×2): qty 1

## 2017-09-19 NOTE — Progress Notes (Signed)
From home d/t chest pressure/R arm weakness. Lives at home w/ significant other; indep at baseline. Pt denies any hx of DME/SNF; previously had HH PT. No identified CM interventions at this time.    PCP: Maryruth Eve.; T: [phone]    DCP: Home, NN    Transport: Sig other to transport       09/19/17 1549   Patient Type   Within 30 Days of Previous Admission? No   Healthcare Decisions   Interviewed: Patient   Orientation/Decision Making Abilities of Patient Alert and Oriented x3, able to make decisions   Advance Directive Patient does not have advance directive   Healthcare Agent Appointed No   Prior to admission   Prior level of function Independent with ADLs;Ambulates independently   Type of Residence Private residence   Have running water, electricity, heat, etc? Yes   Living Arrangements Spouse/significant other   How do you get to your MD appointments? self   How do you get your groceries? self   Who fixes your meals? self   Who does your laundry? self   Who picks up your prescriptions? self   Dressing Independent   Grooming Independent   Feeding Independent   Bathing Independent   Adult Protective Services (APS) involved? No   Discharge Planning   Support Systems Spouse/significant other;Children   Patient expects to be discharged to: Home   Anticipated [place] plan discussed with: Same as interviewed   Mode of transportation: Private car (family member)   Consults/Providers   PT Evaluation Needed 1   OT Evalulation Needed 1   SLP Evaluation Needed 2   Correct PCP listed in Epic? Yes   PCP   PCP on file was verified as the current PCP? Yes   Important Message from Medicare Notice   Patient received 1st IMM Letter? n/a     Titus Mould, BSN, RN  Clinical Case Manager 1  Case Management Department  [facility]  T: [phone]

## 2017-09-19 NOTE — Progress Note - Problem Oriented Charting Notewrit (Addendum)
Pain level is 9/10. Offered Oxycodone IR. Given Benadryl before the medication with positive result.

## 2017-09-19 NOTE — Plan of Care (Signed)
Problem: Safety  Goal: Patient will be free from injury during hospitalization  Outcome: Progressing   09/19/17 1952   Goal/Interventions addressed this shift   Patient will be free from injury during hospitalization  Assess patient's risk for falls and implement fall prevention plan of care per policy;Provide and maintain safe environment;Ensure appropriate safety devices are available at the bedside       Problem: Pain  Goal: Pain at adequate level as identified by patient  Outcome: Progressing   09/19/17 1952   Goal/Interventions addressed this shift   Pain at adequate level as identified by patient Identify patient comfort function goal;Assess pain on admission, during daily assessment and/or before any "as needed" intervention(s);Reassess pain within 30-60 minutes of any procedure/intervention, per Pain Assessment, Intervention, Reassessment (AIR) Cycle       Problem: Every Day - Stroke  Goal: Core/Quality measure requirements - Daily  Outcome: Progressing   09/19/17 1952   Goal/Interventions addressed this shift   Core/Quality measure requirements - Daily  VTE Prevention: Ensure anticoagulant(s) administered and/or anti-embolism stockings/devices documented by end of day 2;Continue stroke education (must include Modifiable Risk Factors, Warning Signs and Symptoms of Stroke, Activation of Emergency Medical System and Follow-up Appointments). Ensure handout has been given and documented.;Once lipid panel has resulted, check LDL. Contact provider for statin order if LDL > 70 (or ensure contraindication documented by LIP).     Goal: Neurological status is stable or improving  Outcome: Progressing   09/19/17 1952   Goal/Interventions addressed this shift   Neurological status is stable or improving Monitor/assess/document neurological assessment (Stroke: every 4 hours);Monitor/assess NIH Stroke Scale;Re-assess NIH Stroke Scale for any change in status;Perform CAM Assessment     Goal: Stable vital signs and fluid  balance  Outcome: Progressing   09/19/17 1952   Goal/Interventions addressed this shift   Stable vital signs and fluid balance  Position patient for maximum circulation/cardiac output;Monitor and assess vitals every 4 hours or as ordered and hemodynamic parameters;Monitor intake and output. Notify LIP if urine output is < 30 mL/hour.;Encourage oral fluid intake       Problem: Chest Pain  Goal: Vital signs and cardiac rhythm stable  Outcome: Progressing   09/19/17 1952   Goal/Interventions addressed this shift   Vital signs and cardiac rhythm stable Monitor /assess vital signs/cardiac rhythms;Assess the need for oxygen therapy and administer as ordered;Monitor labs     Goal: Cardiac pain management  Outcome: Progressing   09/19/17 1952   Goal/Interventions addressed this shift   Cardiac pain management  Assess/report chest pain/or related discomfort to LIP immediately;Instruct patient to report any change in pain status;Assess pain/or related discomfort on admission, during daily assessment, before and after any intervention       Comments: Pt is AOx4, NSR, RA. Pt c/o chest pain that radiates to the R shoulder and down to the lower back. Pain is intermittent and is only alleviated by Morphine IVP. TEE was canceled per Cardiology. CT chest without contrast done instead, resulted negative. Pt agreed with MD that pt will take Oxycodone tonight to assess effectiveness in alleviating pain. If it is effective, MD will prescribe Oxycodone for pt to take upon d/c. Writer asked for Benadryl IV in case pt has an allergic reaction. Will report to night nurse who will administer Oxy. Will continue to monitor.

## 2017-09-19 NOTE — Discharge Summary (Signed)
[facility]      Patient: [name] Cook  Admission Date: 09/15/2017   DOB: 10-21-1975  Discharge Date: 09/20/2017   MRN: [id number]  Discharge Attending:Areal Cochrane D Mckinleigh Schuchart     Referring Physician: Margurite Auerbach, MD  PCP: Margurite Auerbach, MD       DISCHARGE SUMMARY     Discharge Information   Admission Diagnosis:   Right arm weakness    Discharge Diagnosis:   Active Hospital Problems    Diagnosis   . Right arm weakness        Admission Condition: Guarded   Discharge Condition: Stable   Consultants: Neurology, cardiology   Functional Status: Ambulatory   Discharged to: Home     Discharge Medications:     Medication List      START taking these medications    aspirin 81 MG chewable tablet  Chew 1 tablet (81 mg total) by mouth daily     clopidogrel 75 mg tablet  Commonly known as:  PLAVIX  Take 1 tablet (75 mg total) by mouth daily     oxyCODONE 5 MG immediate release tablet  Commonly known as:  ROXICODONE  Take 1 tablet (5 mg total) by mouth every 6 (six) hours as needed for Pain        CONTINUE taking these medications    atorvastatin 40 MG tablet  Commonly known as:  LIPITOR     KEPPRA PO     lidocaine 5 %  Commonly known as:  LIDODERM  Place 1 patch onto the skin daily. Remove & Discard patch within 12 hours or as directed by MD     lisinopril 40 MG tablet  Commonly known as:  PRINIVIL,ZESTRIL     NITROSTAT 0.4 MG SL tablet  Generic drug:  nitroglycerin           Where to Get Your Medications      These medications were sent to [id number] - [place], [place] - [address] [facility]  [address] [place], [place] [address]    Phone:  [phone]    aspirin 81 MG chewable tablet   clopidogrel 75 mg tablet     You can get these medications from any pharmacy    Bring a paper prescription for each of these medications   oxyCODONE 5 MG immediate release tablet             Hospital Course   Presentation History   Per Dr Ermalinda Barrios "[name] Cook is a 42 y.o. female with a PMHx  of hypertension, hyperlipidemia, history of CVA/TIA, seizure disorder who presented with left-sided chest pain and lower back pain since yesterday.  Patient reported that she started to have sudden onset of sharp radicular 7/10 constant lower back pain associated with transient numbness of the right leg and weakness of the right arm.  Patient also had sharp, constant, 6/10 left-sided chest pain not associated with shortness of breath.  The pain became intense since this morning for which she came to the ED. no similar chest pain in the past.  Patient had an abnormal stress test in 2014 but developed anaphylactic reaction during LHC.  She denied headache, blurring of vision, nausea, vomiting, ataxia"    See HPI for details.    Hospital Course (0 Days)   Right arm weakness/numbness  - CT head negative for acute processes  - ECHO reviewed  - continue asa and plavix per neuro, rx given for 30 days.    -  op MRI (pt unable to fit into MRI machine here due to obesity)  - continue lipitor     Hypertension, well controlled  - continue lisinopril     Seizures  - continue keppra     Chest pain, resolved initially however now persists, intermittent in nature   - cardiology on board, appreciate recs  - stress test negative   - d dimer negative  - discussed pursuing TEE for pt with Dr Georgeanna Lea, he recommends that we monitor pt for now overnight, which I agree with, low suspicion of aortic dissection. Vitals are stable.   - cannot pursue CT imaging due to IV contrast allergy. Cannot pursue MRA study due to morbid obesity. Cardiology recommended that pt have CT chest and abdomen done wo contrast which have resulted as negative for any acute processes.     Return instructions given.     After I discussed [name] Cook with patient she then stated to RN that she cannot go home today because her husband is not able to pick her up due to her son getting surgery. Seeing that there is no medical reason that pt needs further inpatient stay she has  been discharged in medical stable condition. I offered several different pain medications for her chest pain which I suspect is 2/2 fibormyalgia given that it is chronic ongoing for more than 6 months. She has declined due to fear of allergy given her reactions in the past. She is only agreeable to morphine.     Pt has been discharged in medically stable condition. Cleared by cardiology and GI for op follow up and work up.     Advised that she follow up with her PCP on Friday for follow up visit     Procedures/Imaging:   Ct Abdomen Wo Contrast    Result Date: 09/19/2017   1. No CT evidence for abdominal aortic aneurysm. 2. Incidental findings as described above. Sandie Ano, MD 09/19/2017 12:36 PM    Xr Lumbar Spine Ap And Lateral    Result Date: 09/16/2017  1. Mild degenerative changes as above Charlene Brooke, MD 09/16/2017 11:26 AM    Ct Head Wo Contrast    Result Date: 09/15/2017  No CT evidence of an acute intracranial abnormality. Aldean Ast, MD 09/15/2017 3:46 PM    Ct Chest Wo Contrast    Result Date: 09/19/2017   1. No CT evidence for thoracic aortic aneurysm. 2. Incidental findings as described above. Sandie Ano, MD 09/19/2017 12:30 PM    Xr Chest Ap Portable    Result Date: 09/18/2017   Minor bibasilar atelectasis.Filbert Schilder, MD 09/18/2017 2:09 PM    Xr Chest  Ap Portable    Result Date: 09/15/2017  No acute thoracic process. Aldean Ast, MD 09/15/2017 2:44 PM    US Carotid Duplex Dopp Comp    Result Date: 09/16/2017    Normal duplex evaluation of the carotid and vertebral arteries. Dimitrios  Papadouris, MD 09/16/2017 9:26 PM           Best Practices   Was the patient admitted with either a CHF Exacerbation or Pneumonia? No      Progress Note/Physical Exam at Discharge     Subjective: Pt seen and examined at bedside. She reports chest pain radiating to her back and shoulder. Denies nausea/vomiting. Ready to go home.     Vitals:    09/19/17 0000 09/19/17 0400 09/19/17 0800 09/19/17 1200   BP:  118/66 105/54 119/75  131/63   Pulse: 68 61  64   Resp: 18 18 16 20    Temp: 98.3 F (36.8 C) 97.7 F (36.5 C) 97.2 F (36.2 C) 97.2 F (36.2 C)   TempSrc: Oral Oral Oral Axillary   SpO2: 97% 96% 92% 98%   Weight:   139.9 kg (308 lb 8 oz)    Height:           General: NAD, AAOx3  HEENT: perrla, eomi, sclera anicteric, OP: Clear, MMM  Neck: supple, FROM, no LAD  Cardiovascular: RRR, no m/r/g  Lungs: CTAB, no w/r/r  Abdomen: soft, +BS, NT/ND, no masses, no g/r  Extremities: no C/C/E  Skin: no rashes or lesions noted  Neuro: non focal, moves all extremities        Diagnostics     Labs/Studies Pending at Discharge: No    Last Labs     Recent Labs  Lab 09/19/17  0520 09/18/17  0704 09/18/17  0326   WBC 8.66 7.81 7.79   RBC 4.06 4.21 4.12   Hgb 9.0* 9.4* 9.2*   Hematocrit 31.4* 32.9* 32.5*   MCV 77.3* 78.1 78.9   Platelets 214 199 216         Recent Labs  Lab 09/19/17  0520 09/18/17  0704 09/18/17  0326 09/17/17  0539 09/15/17  1430   Sodium 137 138 138 138 141   Potassium 3.9 4.2 4.0 3.9 3.5   Chloride 105 104 103 105 107   CO2 25 26 26 27 27    BUN 17.0 14.0 14.0 12.0 12.0   Creatinine 0.9 0.9 0.9 0.9 0.9   Glucose 111* 103* 90 103* 84   Calcium 8.5 8.7 8.6 8.3* 9.0       Microbiology Results     None           Patient Instructions   Discharge Diet: cardiac   Discharge Activity: as tolerted    Follow Up Appointment:  Follow-up Information     Margurite Auerbach, MD Follow up.    Specialty:  Family Medicine  Why:  hospital follow up on FIrday   Contact information:  [address]  [place] [address]  [phone]                    Time spent examining patient, discussing with patient/family regarding hospital course, chart review, reconciling medications and discharge planning: 45 minutes.    Fayette Pho    3:41 PM 09/19/2017

## 2017-09-19 NOTE — Plan of Care (Signed)
Problem: Pain  Goal: Pain at adequate level as identified by patient  Outcome: Progressing  C/o chest pain radiating to the back,  8/10. IV Morphine given, pain goes down to 6/10, 3 doses given. NPO from [place] for TEE today.     Problem: Every Day - Stroke  Goal: Neurological status is stable or improving   09/19/17 0251   Goal/Interventions addressed this shift   Neurological status is stable or improving Monitor/assess/document neurological assessment (Stroke: every 4 hours);Monitor/assess NIH Stroke Scale;Re-assess NIH Stroke Scale for any change in status;Observe for seizure activity and initiate seizure precautions if indicated;Perform CAM Assessment   Alert and oriented x4. Neuro check done Q 4 hrs, still weak at the right side with decrease sensation. NSR on the monitor. Able to teach back symptoms of stroke. Standby assist to the bathroom.

## 2017-09-19 NOTE — Progress Notes (Signed)
[place] [facility] PROGRESS NOTE  [facility]     Date Time: 09/19/17 9:27 AM  Patient Name: [name] Cook, [name] Cook  Date of Birth: December 28, 1975  CSN: [id number]  MRN: [id number]         Subjective:   Continued episodes of chest pain with radiation toward low back and the shoulders.  She reports episodes of last for up to 10 minutes at a time and resolve spontaneously.  There is no positional component to her symptoms.  She does report associated nausea  Hospital Medications:      Scheduled Meds: PRN Meds:      aspirin 81 mg Oral Daily   atorvastatin 40 mg Oral QHS   bisacodyl 10 mg Rectal Once   clopidogrel 75 mg Oral Daily   enoxaparin 40 mg Subcutaneous QHS   levETIRAcetam 500 mg Oral Q12H SCH   lisinopril 40 mg Oral Daily   polyethylene glycol 17 g Oral Daily       Continuous Infusions:     cyclobenzaprine 10 mg TID PRN   hydrALAZINE 10 mg Q3H PRN   labetalol 10 mg Q15 Min PRN   morphine 2 mg Q4H PRN   nitroglycerin 0.4 mg Q5 Min PRN   ondansetron 4 mg Once PRN   oxyCODONE 5 mg Q6H PRN             Home Medications:     Prescriptions Prior to Admission   Medication Sig Dispense Refill Last Dose   . atorvastatin (LIPITOR) 40 MG tablet Take by mouth.   09/15/2017 at 1200   . LevETIRAcetam (KEPPRA PO) Take 500 mg by mouth 2 (two) times daily.      09/15/2017 at 0800   . lisinopril (PRINIVIL,ZESTRIL) 40 MG tablet Take 40 mg by mouth daily.   09/15/2017 at 1200   . lidocaine (LIDODERM) 5 % Place 1 patch onto the skin daily. Remove & Discard patch within 12 hours or as directed by MD 6 each 0 Unknown at Unknown time   . nitroglycerin (NITROSTAT) 0.4 MG SL tablet Place under the tongue.   Unknown at Unknown time         Physical Exam:     VITAL SIGNS   Temp:  [97.7 F (36.5 C)-98.7 F (37.1 C)] 97.7 F (36.5 C)  Heart Rate:  [57-75] 61  Resp Rate:  [18] 18  BP: (104-137)/(54-77) 105/54  POCT Glucose Result (Read Only)  Avg: 93  Min: 93  Max: 93  SpO2: 96 %    Intake/Output Summary (Last 24 hours) at 09/19/17 8295  Last data filed  at 09/18/17 2000   Gross per 24 hour   Intake              100 ml   Output                0 ml   Net              100 ml          GENERAL: Patient is in no acute distress   HEENT: No scleral icterus or conjunctival pallor, moist mucous membranes   NECK: No jugular venous distention, normal carotid upstrokes without bruits   CARDIAC: Regular rate and rhythm, with normal S1 and S2, and no murmurs, rubs, or gallops   CHEST: Clear to auscultation bilaterally, normal respiratory effort  ABDOMEN: Nontender, non-distended  EXTREMITIES: No edema  PULSES: Full and equal bilaterally.  NEUROLOGIC: Alert and oriented to time, place and person,  normal mood and affect   MUSCULOSKELETAL: Normal muscle strength and tone.      Labs:     CBC w/Diff     Recent Labs  Lab 09/19/17  0520 09/18/17  0704 09/18/17  0326   WBC 8.66 7.81 7.79   Hgb 9.0* 9.4* 9.2*   Hematocrit 31.4* 32.9* 32.5*   Platelets 214 199 216          Comprehensive Metabolic Profile     Recent Labs  Lab 09/19/17  0520 09/18/17  0704 09/18/17  0326  09/15/17  1430   Sodium 137 138 138 More results in Results Review 141   Potassium 3.9 4.2 4.0 More results in Results Review 3.5   Chloride 105 104 103 More results in Results Review 107   CO2 25 26 26  More results in Results Review 27   BUN 17.0 14.0 14.0 More results in Results Review 12.0   Creatinine 0.9 0.9 0.9 More results in Results Review 0.9   Calcium 8.5 8.7 8.6 More results in Results Review 9.0   Albumin  --   --   --   --  3.9   Protein, Total  --   --   --   --  7.9   Bilirubin, Total  --   --   --   --  0.7   Alkaline Phosphatase  --   --   --   --  88   ALT  --   --   --   --  16   AST (SGOT)  --   --   --   --  14   Glucose 111* 103* 90 More results in Results Review 84   More results in Results Review = values in this interval not displayed.       Cardiac Enzymes     Recent Labs  Lab 09/18/17  1257 09/16/17  0338 09/15/17  2128 09/15/17  1430   Troponin I <0.01 <0.01 <0.01 <0.01          Thyroid Studies           Invalid input(s): FREET4        Lipid Profile     Recent Labs  Lab 09/16/17  0338   Cholesterol 138          Coagulation Studies     Recent Labs  Lab 09/15/17  1430   PT 13.2   PT INR 1.0   PTT 32     Lab Results   Component Value Date    DDIMER 0.44 09/18/2017            Telemetry:   Normal sinus rhythm    Rads:   Echocardiogram 2d Complete    Result Date: 09/17/2017  Name:     [name] Cook Age:     41 years DOB:     14-Jan-1976 Gender:     Female MRN:     [id number] Wt:     313 lb BSA:     2.72 m2 Systolic BP:     782 mmHg Diastolic BP:     90 mmHg Technical Quality:     Technically limited Exam Date/Time:     09/16/2017 11:03 AM Exam Type:     ECHOCARDIOGRAM ADULT COMPLETE W CLR/ DOPP WAVEFORM Staff Sonographer:     Alexis Goodell,  RCS Ordering Physician:     Clarita Leber Study Info Indications      - chest  pain Procedure   Complete two-dimensional, color flow and spectral Doppler transthoracic echocardiogram is performed. 70 in History/Risk Factors Hypertension:     Yes Hyperlipidemia:     Yes TIA/CVA:     Yes Summary   * Left ventricular systolic function is normal with an ejection fraction by Biplane Method of Discs of  53 %.   * There is mild concentric left ventricular hypertrophy.   * The left ventricle is normal in size.   * Left ventricular segmental wall motion is normal.   * Left ventricular diastolic filling parameters demonstrate normal diastolic function.   * The left atrium is normal in size.   * There is no aortic stenosis.   * There is no aortic regurgitation.   * The pulmonic valve is not well visualized.   * There is no mitral regurgitation.   * No pulmonary hypertension with estimated right ventricular systolic pressure of  31.86 mmHg. Findings Left Ventricle   The left ventricle is normal in size.   There is mild concentric left ventricular hypertrophy.   Left ventricular systolic function is normal with an ejection fraction by Biplane Method of Discs of 53 %.   Left  ventricular segmental wall motion is normal.   Left ventricular diastolic filling parameters demonstrate normal diastolic function. Right Ventricle   The right ventricular cavity size is normal in size.   Normal right ventricular systolic function. Left Atrium   The left atrium is normal in size. Right Atrium   The right atrium is normal in size. Atrial Septum   No evidence of interatrial shunt by color Doppler. Aortic Valve   The aortic valve is tricuspid.   There is no aortic stenosis.   There is no aortic regurgitation. Pulmonary Valve   The pulmonic valve is not well visualized.   There is no pulmonic regurgitation. Mitral Valve   The mitral valve is structurally normal.   There is no mitral regurgitation. Tricuspid Valve   The tricuspid valve is structurally normal.   There is trace tricuspid regurgitation.   No pulmonary hypertension with estimated right ventricular systolic pressure of 31.86 mmHg. Pericardium / Pleural Effusion   No pericardial effusion visualized. Inferior Vena Cava   The IVC is normal in size with > 50% respiratory variance consistent with normal RA pressure of 3 mmHg. Aorta   The aortic root is normal in size.   The ascending aorta is normal in size. Measurements 2D Measurements ---------------------------------------------------------------------- Name                                 Value        Normal ---------------------------------------------------------------------- Parasternal 2D ---------------------------------------------------------------------- IVS Diastolic Thickness (2D)       1.61 cm     0.60-0.90 LVID Diastole (2D)                 5.52 cm     3.80-5.20 LVIW Diastolic Thickness (2D)                               1.17 cm     0.60-0.90 LVID Systole (2D)                  3.64 cm     2.20-3.50 LA Dimension (2D)  3.76 cm     2.70-3.80 Ao Root Diameter (2D)              3.79 cm               LV Ejection Fraction 2D  ---------------------------------------------------------------------- LV EF (BP MOD)                        53 %         54-74 Apical 2D Dimensions ---------------------------------------------------------------------- LA Volume Index (BP A-L)       14.21 ml/m2   16.00-34.00 M-mode Measurements ---------------------------------------------------------------------- Name                                 Value        Normal ---------------------------------------------------------------------- M-Mode ---------------------------------------------------------------------- TAPSE                              2.45 cm        >=1.60 LVOT/Aortic Valve Doppler Measurements ---------------------------------------------------------------------- Name                                 Value        Normal ---------------------------------------------------------------------- LVOT Doppler ---------------------------------------------------------------------- LVOT Peak Velocity                1.10 m/s               AoV Doppler ---------------------------------------------------------------------- AV Peak Velocity                  1.40 m/s               AV Peak Gradient                 7.85 mmHg               AV Mean Gradient                 3.13 mmHg        <20.00 RVOT/Pulmonic Valve Doppler Measurements ---------------------------------------------------------------------- Name                                 Value        Normal ---------------------------------------------------------------------- PV Doppler ---------------------------------------------------------------------- PV Peak Velocity                  0.95 m/s Mitral Valve Measurements ---------------------------------------------------------------------- Name                                 Value        Normal ---------------------------------------------------------------------- MV Doppler ---------------------------------------------------------------------- MV E Peak  Velocity                0.97 m/s               MV A Peak Velocity                0.78 m/s               MV E/A  1.24               MV Annular TDI ---------------------------------------------------------------------- MV E/e' (Septal)                     10.69        <=8.00 MV E/e' (Lateral)                     7.96        <=8.00 Tricuspid Valve Measurements ---------------------------------------------------------------------- Name                                 Value        Normal ---------------------------------------------------------------------- TV Regurgitation Doppler ---------------------------------------------------------------------- TR Peak Velocity                  2.69 m/s               TR Peak Gradient                   29 mmHg               RA Pressure                      3.00 mmHg        <=3.00 RV Systolic Pressure            31.86 mmHg        <36.00 Report Signatures Finalized [id number]  Miller on 09/17/2017 12:41:14 PM Promoted [id number]  Emmi on 09/17/2017 11:53:43 AM    Xr Lumbar Spine Ap And Lateral    Result Date: 09/16/2017  CLINICAL INDICATION: Acute low back pain with no history of trauma, fever or surgery COMPARISON: No pertinent studies INTERPRETATION: Supine AP, lateral and both oblique views were performed. L5-S1 is mildly narrowed. There is focal spur without narrowing at L3-4. The pedicles of the 5 lumbar vertebral bodies are intact. No fracture, spondylolysis, spondylolisthesis or destructive process is seen. There is degenerative change in lower thoracic spine with spur formation at T12-L1 and T11-12.     1. Mild degenerative changes as above Charlene Brooke, MD 09/16/2017 11:26 AM    Ct Head Wo Contrast    Result Date: 09/15/2017  CT HEAD CLINICAL STATEMENT: Weakness in the right body. Decreased alertness. COMPARISON: CT head dated 05/11/2016. TECHNIQUE: Multiple 5 mm thickness axial images were obtained from the skull base to the vertex without IV  contrast administration. Coronal and sagittal reformatted images were then obtained. Note that CT scanning at this site utilizes multiple dose reduction techniques including automatic exposure control, adjustment of the MAS and/or KVP according to patient size, and use of iterative reconstruction technique. FINDINGS: Gray-white matter junction differentiation is appropriate. No intracranial hemorrhage is identified. The ventricles are appropriate in size, shape and are midline. There is no intracranial mass, mass-effect or midline shift. No abnormal intra or extra-axial fluid collections are identified. The visualized paranasal sinuses and mastoid air cells are well-aerated. The osseous calvarium is intact.     No CT evidence of an acute intracranial abnormality. Aldean Ast, MD 09/15/2017 3:46 PM    Nm Myocardial Perfusion Spect (stress And Rest)    Result Date: 09/18/2017  [facility]                                                                                 [address]                                                                                 [place], [place] [address]                                                                                 [phone]  Myocardial Perfusion Report  PESSY, DELAMAR                    Exam Date: 09/17/2017 12:55          Ordering Phys: Ma Hillock  MRN:  [id number]       Gender: F    Exam Location: [facility] [place]       Referring Phys: Age:  42   DOB:  Jun 08, 1975       Ht (in):70  Wt (lb):313 BSA  2.72    PCP:           Doy Hutching D                                                            :                                                                  Nuclear Technologist:Rodriguez, Natalie CNMT Indications:     CHEST PAIN, ACUTE CORONARY SYNDROME SUSPECTED; chest pain Procedure Performed:Pharmacological Stress and Rest Myocardial Perfusion SPECT Imaging with  LV function analysis. Patient History:  HTN, High cholesterol  IMPRESSIONS Normal stress and rest myocardial perfusion study without evidence of inducible ischemia or scar.   Normal left ventricular function with calculated EF 66%.   Breast attenuation is present No prior studies are available for comparison.    STRESS TEST     Protocol: Lexiscan Duration (m:s):15 secsDose: 0.4mg  regadenoson given 30 seconds prior to stress radionuclide Pharmacologic stress exercise:Not Used Pharmacologic Administration Site:Intravenous Left     Administered UV:[id number] Abebe  Resting HR (bpm):  59    Resting BP (mmHg):122  /75        MPHR:   179      Target HR:     152 Peak HR (bpm):     133   Peak BP (mmHg):   154  /80      % MPHR:   74       Double Product:20482 BP Response:       Normal hemodynamic response to Lexiscan. Termination Reason:Lexiscan protocol completed.  Patient Symptoms:  No cardiac symptoms.  ECG ANALYSIS Resting ECG:   Sinus rhythm with no significant ST changes. Stress ECG:    No significant ST changes. Normal lexiscan  ECG. Arrhythmias:   No arrhythmias. Stress Test Interpreted ZO:[id number] Hyacinth Meeker The above interpretation is as per the stressing physician who was physically present and supervised and interpreted the study.  IMAGE PROTOCOL       Stress/Rest 2 Day Stress radionuclide dose injected at peak stress.RadiopharDose (mCi)ticalAdministration SiteAdministered by Rest:   Tc-62m Tetrofosmin   31           IV - left antecubital     Elder Cyphers CNMT Stress: Tc-31m Tetrofosmin   29.1         IV - left antecubital     Elder Cyphers CNMT          Imaging  Date & Time Inj to Img Time (min)Camera Used                           Position Rest:   18-Sep-2017          30                   S[employer]ens Symbia                        Supine Stress: 17-Sep-2017          45                   S[employer] Symbia                        Supine SPECT RESULTS   Technical Quality:  Fair Image Artifacts:    breast Image  Corrections:  2day study due to body habitus PERFUSION FINDINGS Small mild predominantly fixed apical defect in an area of breast attenuation.  Probably normal study   FUNCTIONAL RESULTS     (calculated via Gated SPECT)  Post Stress LV EF (%): 66  Stress EDV (mL):145           EDVI (ml/m??? ):53  Stress ESV (mL):49            ESVI (ml/m??? ):18   FUNCTIONAL FINDINGS: Gated wall motion study demonstrates normal left ventricular thickening and wall motion with calculated LVEF 66%.     Nuclear Reader:                                                               Stress Reader:Stress Reader:   Rogelio Seen  Rolla Plate   (Electronically Signed)                                     (Electronically Signed)(Electronically Signed)   Final Date:18 September 2017 13:36                               Final Date:Final Date:17 September 2017 12:1708 July 2019 12:17     Xr Chest Ap Portable    Result Date: 09/18/2017  CLINICAL INDICATION: Chest pain COMPARISON: 09/15/2017 INTERPRETATION: Single portable frontal view of the chest obtained. Stable cardiomediastinal contour with top normal heart size. Minor left basilar atelectasis. Linear plate atelectasis at the right lung base. No pulmonary edema, consolidation, or pleural effusion.      Minor bibasilar atelectasis.Filbert Schilder, MD 09/18/2017 2:09 PM    Xr Chest  Ap Portable    Result Date: 09/15/2017  INDICATION: cp TECHNIQUE: Portable AP radiograph of the chest COMPARISON: Chest radiograph dated 04/15/2017. FINDINGS: Cardiomediastinal silhouette is normal in size and appearance.  Lungs are clear. Pleural spaces are clear. No pneumothorax.  Upper abdomen and regional osseous structures demonstrate no acute abnormality.     No acute thoracic process. Aldean Ast, MD 09/15/2017 2:44 PM    US Carotid Duplex Dopp Comp    Result Date: 09/16/2017  HISTORY: Right-sided weakness and diminished sensation. Obesity, hypertension,  hypercholesterolemia, concern for acute stroke. TECHNIQUE:  Duplex evaluation of the cerebral vasculature is performed from the clavicles to the angles of the mandible utilizing gray scale, gated Doppler and color Doppler techniques. Velocities were used in reference to NASCET criteria as a determinate of carotid diameter. INTERPRETATION:  Examination of both left and right carotid arteries demonstrates normal morphologic appearance without evidence of significant plaque formation or stenosis.  Peak systolic and end-diastolic velocity measurements are normal throughout common, internal and external carotid arteries bilaterally.  No focal elevation of velocity or turbulence is present to suggest significant stenotic or occlusive disease. Bilateral antegrade vertebral artery flow is demonstrated.       Normal duplex evaluation of the carotid and vertebral arteries. Dimitrios  Papadouris, MD 09/16/2017 9:26 PM      Assessment:    Admitted with symptoms including chest pain nausea dizziness right-sided arm and leg weakness and right-sided lower back pain.  Continued episodes of chest discomfort as described above   She reports episodes of hemoptysis within the last few months.  She reports that this was never evaluated   Normal troponins.  Normal Lexiscan nuclear study. Unremarkable echocardiogram   Obesity   Anaphylactic reaction to contrast   Hypertension   Seizure disorder      Plan:    Consider noncontrast CT scan of chest and abdomen (history of apparent anaphylaxis with contrast noted)   Consider GI evaluation    Signed by: Sandria Senter      [facility]  NP Spectralink [phone] (8am-5pm)  MD Spectralink [phone] or [phone]  Dr Letitia Neri [phone] (8am-5pm)  After hours, non urgent consult line [phone]  After Hours, urgent consults [phone]

## 2017-09-19 NOTE — UM Notes (Signed)
09/19/17  [name] Cook reports chest pain, radiating to shoulder and back intermittent in nature.     BP 122/71   Pulse 78   Temp 97.8 F (36.6 C) (Axillary)   Resp 19   Ht 1.778 m (5\' 10" )   Wt 139.9 kg (308 lb 8 oz)   LMP 09/13/2017   SpO2 97%   BMI 44.27 kg/m     Temp:  [97.2 F (36.2 C)-98.7 F (37.1 C)]   Heart Rate:  [61-78]   Resp Rate:  [16-20]   BP: (105-134)/(54-75)   SpO2:  [92 %-98 %]   Weight:  [139.9 kg (308 lb 8 oz)]     Last recorded pain score:  Pain Scale Used: Numeric Scale (0-10)  Pain Score: 7-severe pain     Results     Procedure Component Value Units Date/Time    Basic Metabolic Panel [[id number]]  (Abnormal) Collected:  09/19/17 0520    Specimen:  Blood Updated:  09/19/17 0615     Glucose 111 (H) mg/dL      BUN 93.2 mg/dL      Creatinine 0.9 mg/dL      Calcium 8.5 mg/dL      Sodium 355 mEq/L      Potassium 3.9 mEq/L      Chloride 105 mEq/L      CO2 25 mEq/L      Anion Gap 7.0    CBC without differential [[id number]]  (Abnormal) Collected:  09/19/17 0520    Specimen:  Blood from Blood Updated:  09/19/17 0537     WBC 8.66 x10 3/uL      Hgb 9.0 (L) g/dL      Hematocrit 70.6 (L) %      Platelets 214 x10 3/uL      RBC 4.06 x10 6/uL      MCV 77.3 (L) fL      MCH 22.2 (L) pg      MCHC 28.7 (L) g/dL      RDW 17 (H) %      MPV 12.5 fL      Nucleated RBC 0.0 /100 WBC      Absolute NRBC 0.00 x10 3/uL      Scheduled Meds:  Current Facility-Administered Medications   Medication Dose Route Frequency   . aspirin  81 mg Oral Daily   . atorvastatin  40 mg Oral QHS   . bisacodyl  10 mg Rectal Once   . clopidogrel  75 mg Oral Daily   . enoxaparin  40 mg Subcutaneous QHS   . levETIRAcetam  500 mg Oral Q12H SCH   . lisinopril  40 mg Oral Daily   . polyethylene glycol  17 g Oral Daily     ASSESSMENT/PLAN  [name] Cook is a 42 y.o. female admitted with right arm weakness     Interval Summary:     Patient Active Hospital Problem List:   Right arm weakness (09/15/2017)   Hypertension    Seizures   TIAs     Right sided numbness/weakness     Right arm weakness/numbness  - CT head negative for acute processes  - ECHO reviewed  - continue asa and plavix per neuro   - op MRI (pt unable to fit into MRI machine here due to obesity)  - continue lipitor     Hypertension, well controlled  - continue lisinopril     Seizures  - continue keppra     Chest pain, resolved initially today c/o again  -  cardiology on board, appreciate recs  - stress test negative   - d dimer negative  - discussed pursuing TEE for pt with Dr Georgeanna Lea, he recommends that we monitor pt for now overnight, which I agree with, low suspicion of aortic dissection. Vitals are stable.   - cannot pursue CT imaging due to IV contrast allergy. Cannot pursue MRA study due to morbid obesity  - per cardiology, Dr Donnie Coffin does not recommend TEE given low suspicion for aortic dissection.   - CT chest/abdomen reviewed, negative for acute processes.     Nutrition: cardiac diet   DVT Prophylaxis: lovenox       Larina Earthly, BSN, RN  Utilization Review Nurse  Case Management Dept  [facility]  [phone] [facility] Staff Only)  [phone] (Carriers)    Please submit all clinical review request via fax to [phone]

## 2017-09-19 NOTE — Progress Notes (Signed)
[facility]  PROGRESS NOTE      Patient: [name] Cook  Date: 09/19/2017   LOS: 0 Days  Admission Date: 09/15/2017   MRN: [id number]  Attending: Derek Mound  Please contact me on Perfect Serve     ASSESSMENT/PLAN     [name] Cook is a 42 y.o. female admitted with right arm weakness   Interval Summary:     Patient Active Hospital Problem List:   Right arm weakness (09/15/2017)   Hypertension    Seizures   TIAs    Right sided numbness/weakness     Right arm weakness/numbness  - CT head negative for acute processes  - ECHO reviewed  - continue asa and plavix per neuro   - op MRI (pt unable to fit into MRI machine here due to obesity)  - continue lipitor     Hypertension, well controlled  - continue lisinopril     Seizures  - continue keppra     Chest pain, resolved initially today c/o again  - cardiology on board, appreciate recs  - stress test negative   - d dimer negative  - discussed pursuing TEE for pt with Dr Georgeanna Lea, he recommends that we monitor pt for now overnight, which I agree with, low suspicion of aortic dissection. Vitals are stable.   - cannot pursue CT imaging due to IV contrast allergy. Cannot pursue MRA study due to morbid obesity  - per cardiology, Dr Donnie Coffin does not recommend TEE given low suspicion for aortic dissection.   - CT chest/abdomen reviewed, negative for acute processes.     Nutrition: cardiac diet   DVT Prophylaxis: lovenox      Code Status: Full  DISPO: TBD       SUBJECTIVE     [name] Cook reports chest pain, radiating to shoulder and back intermittent in nature.     MEDICATIONS     Current Facility-Administered Medications   Medication Dose Route Frequency   . aspirin  81 mg Oral Daily   . atorvastatin  40 mg Oral QHS   . bisacodyl  10 mg Rectal Once   . clopidogrel  75 mg Oral Daily   . enoxaparin  40 mg Subcutaneous QHS   . levETIRAcetam  500 mg Oral Q12H SCH   . lisinopril  40 mg Oral Daily   . polyethylene glycol  17 g Oral Daily       PHYSICAL EXAM     Vitals:    09/19/17  1200   BP: 131/63   Pulse: 64   Resp: 20   Temp: 97.2 F (36.2 C)   SpO2: 98%       Temperature: Temp  Min: 97.2 F (36.2 C)  Max: 98.7 F (37.1 C)  Pulse: Pulse  Min: 57  Max: 75  Respiratory: Resp  Min: 16  Max: 20  Non-Invasive BP: BP  Min: 104/58  Max: 134/71  Pulse Oximetry SpO2  Min: 92 %  Max: 99 %    Intake and Output Summary (Last 24 hours) at Date Time    Intake/Output Summary (Last 24 hours) at 09/19/17 1548  Last data filed at 09/19/17 1329   Gross per 24 hour   Intake              200 ml   Output                0 ml   Net  200 ml       GEN APPEARANCE: Normal;  A&OX3  HEENT: PERLA; EOMI; Conjunctiva Clear  NECK: Supple; No bruits  CVS: RRR, S1, S2; No M/G/R  LUNGS: CTAB; No Wheezes; No Rhonchi: No rales  ABD: Soft; No TTP; + Normoactive BS  EXT: No edema; Pulses 2+ and intact  Skin exam:  pink  NEURO: non focal, moves all extremities   CAP REFILL:  Normal  MENTAL STATUS:  Normal      LABS       Recent Labs  Lab 09/19/17  0520 09/18/17  0704 09/18/17  0326   WBC 8.66 7.81 7.79   RBC 4.06 4.21 4.12   Hgb 9.0* 9.4* 9.2*   Hematocrit 31.4* 32.9* 32.5*   MCV 77.3* 78.1 78.9   Platelets 214 199 216         Recent Labs  Lab 09/19/17  0520 09/18/17  0704 09/18/17  0326 09/17/17  0539 09/15/17  1430   Sodium 137 138 138 138 141   Potassium 3.9 4.2 4.0 3.9 3.5   Chloride 105 104 103 105 107   CO2 25 26 26 27 27    BUN 17.0 14.0 14.0 12.0 12.0   Creatinine 0.9 0.9 0.9 0.9 0.9   Glucose 111* 103* 90 103* 84   Calcium 8.5 8.7 8.6 8.3* 9.0         Recent Labs  Lab 09/15/17  1430   ALT 16   AST (SGOT) 14   Bilirubin, Total 0.7   Albumin 3.9   Alkaline Phosphatase 88         Recent Labs  Lab 09/18/17  1257 09/16/17  0338 09/15/17  2128   Troponin I <0.01 <0.01 <0.01         Recent Labs  Lab 09/15/17  1430   PT INR 1.0   PT 13.2   PTT 32       Microbiology Results     None           RADIOLOGY     Ct Abdomen Wo Contrast    Result Date: 09/19/2017   1. No CT evidence for abdominal aortic aneurysm. 2.  Incidental findings as described above. Sandie Ano, MD 09/19/2017 12:36 PM    Xr Lumbar Spine Ap And Lateral    Result Date: 09/16/2017  1. Mild degenerative changes as above Charlene Brooke, MD 09/16/2017 11:26 AM    Ct Head Wo Contrast    Result Date: 09/15/2017  No CT evidence of an acute intracranial abnormality. Aldean Ast, MD 09/15/2017 3:46 PM    Ct Chest Wo Contrast    Result Date: 09/19/2017   1. No CT evidence for thoracic aortic aneurysm. 2. Incidental findings as described above. Sandie Ano, MD 09/19/2017 12:30 PM    Xr Chest Ap Portable    Result Date: 09/18/2017   Minor bibasilar atelectasis.Filbert Schilder, MD 09/18/2017 2:09 PM    Xr Chest  Ap Portable    Result Date: 09/15/2017  No acute thoracic process. Aldean Ast, MD 09/15/2017 2:44 PM    US Carotid Duplex Dopp Comp    Result Date: 09/16/2017    Normal duplex evaluation of the carotid and vertebral arteries. Dimitrios  Papadouris, MD 09/16/2017 9:26 PM      Signed,  Derek Mound  3:48 PM 09/19/2017

## 2017-09-19 NOTE — Consults (Signed)
CONSULTATION NOTE    [address], [place], [place] [address]  [phone]  [name] [id number]    Date of admission: 09/15/2017  Date of consult: 09/19/17    Assessment:  Chest pain neg troponin and stress test.  NL EGD in Feb 2019.  Chest pain is not assoc w/PO intake.  Etiology unclear.   Microcytic anemia pt thinks she had a colonoscopy in the last year but will need confirmation of this. EGD Feb 2019 NL.  Hx of TIA currently on asa/plavix  Hematemesis- self limited occurrence 1.5 mos ago.  EGD in Feb 2019 was normal.  Unclear etiology.    [name] Cook is a 42 y.o. female admitted w/ atypical CP, cardiac w/u neg, etiology unclear.  No assoc w/ PO intake.  Last EGD 04/2017 was NL.  Seems unlikely to be GI related.  ___________________________  Plan:  F/u CT chest  Need to confirm colonoscopy status for w/u of microcytic anemia (outpt)  Otherwise no new GI recs    [name], PA   10:46 AM    Thank you for allowing [place] to see and participate in this patient's care.  This case will be discussed with Dr. Ree Edman who will see this patient as well today and will write an accompanying GI consultation and treatment plan.  ________________________    Referring Physician: Dr. Merlene Laughter    Consulting Physician: Dr. Ree Edman     Reason for consultation: chest pain    Chief complaint: chest pain    HPI:  [name] is a 42 y.o. female w/ PMHx TIA, HTN, seizures, high cholesterol. She was seen by [facility] during admission in Feb 2019 for evaluation of epigastric pain and hematemesis as well as microcytic anemia. She had an EGD done that was essentially normal.  She presented to the ER on 09/15/17 c/o substernal CP.  The pain at this time has been going on for 6d.  She was admitted for CP w/u, r/o ACS.  Her troponins, EKG, stress test have been neg.  GI consultation placed for evaluation of hematemesis 1.5 months ago.  Pt notes she vomited some blood about 1.5 mos ago.  Denies melena, hematochezia, ASA or NSAID  use (although current med list is showing plavix and aspirin).  She says it was an isolated event.  No chronic nausea/vomiting.  Her chest pain is a different pain than she has had in the past.  It is constant and there is no relationship w/ PO intake.  No dysphagia.  No abd pain.  She has chronic anemia.  She thinks she had a colonoscopy in the last 1 yr.  She takes protonix outpt but says she doesn't really have heartburn problems.    EGD 04/16/17:  no evidence of active or recent bleeding, normal upper endoscopic exam, gastric biopsies obtained. Etiology of abdominal pain and hematemesis unclear  GASTRIC BIOPSY:   - GASTRIC ANTRAL MUCOSA WITH NO SIGNIFICANT HISTOPATHOLOGIC      CHANGES SEEN.   - NEGATIVE FOR HELICOBACTER PYLORI.    Past Medical History:   Diagnosis Date   . Hypercholesteremia    . Hypertension    . Seizures    . Stroke     2015 and 2017, right side   . TIA (transient ischemic attack) 2017       Past Surgical History:   Procedure Laterality Date   . APPENDECTOMY     . CHOLECYSTECTOMY     . EGD, BIOPSY N/A 04/16/2017  Procedure: EGD, BIOPSY;  Surgeon: Pershing Proud, MD;  Location: [facility];  Service: Gastroenterology;  Laterality: N/A;       Allergies   Allergen Reactions   . Contrast [Iodinated Diagnostic Agents] Anaphylaxis     Patient woke up in ICU after having CT with contrast, last remembers being in CT   . Fioricet [Butalbital-Apap-Caffeine] Hives   . Iodine    . Motrin [Ibuprofen] Swelling   . Percocet [Oxycodone-Acetaminophen]    . Shellfish-Derived Products    . Toradol [Ketorolac Tromethamine]    . Tramadol    . Tylenol [Acetaminophen] Hives       Social History     Social History   . Marital status: Single     Spouse name: N/A   . Number of children: N/A   . Years of education: N/A     Occupational History   . Not on file.     Social History Main Topics   . Smoking status: Never Smoker   . Smokeless tobacco: Never Used   . Alcohol use No   . Drug use: No   . Sexual  activity: Not on file     Other Topics Concern   . Not on file     Social History Narrative   . No narrative on file       Family History   Problem Relation Age of Onset   . Myocardial Infarction Father 29   . Deep vein thrombosis Father        Current Discharge Medication List      CONTINUE these medications which have NOT CHANGED    Details   atorvastatin (LIPITOR) 40 MG tablet Take by mouth.      LevETIRAcetam (KEPPRA PO) Take 500 mg by mouth 2 (two) times daily.         lisinopril (PRINIVIL,ZESTRIL) 40 MG tablet Take 40 mg by mouth daily.      lidocaine (LIDODERM) 5 % Place 1 patch onto the skin daily. Remove & Discard patch within 12 hours or as directed by MD  Qty: 6 each, Refills: 0      nitroglycerin (NITROSTAT) 0.4 MG SL tablet Place under the tongue.             Current Facility-Administered Medications   Medication Dose Route Frequency Last Rate Last Dose   . aspirin chewable tablet 81 mg  81 mg Oral Daily   81 mg at 09/19/17 0900   . atorvastatin (LIPITOR) tablet 40 mg  40 mg Oral QHS   40 mg at 09/18/17 2150   . bisacodyl (DULCOLAX) suppository 10 mg  10 mg Rectal Once       . clopidogrel (PLAVIX) tablet 75 mg  75 mg Oral Daily   75 mg at 09/19/17 0900   . cyclobenzaprine (FLEXERIL) tablet 10 mg  10 mg Oral TID PRN   10 mg at 09/17/17 2211   . enoxaparin (LOVENOX) syringe 40 mg  40 mg Subcutaneous QHS   40 mg at 09/18/17 2150   . hydrALAZINE (APRESOLINE) injection 10 mg  10 mg Intravenous Q3H PRN       . labetalol (NORMODYNE,TRANDATE) injection 10 mg  10 mg Intravenous Q15 Min PRN       . levETIRAcetam (KEPPRA) tablet 500 mg  500 mg Oral Q12H SCH   500 mg at 09/19/17 0900   . lisinopril (PRINIVIL,ZESTRIL) tablet 40 mg  40 mg Oral Daily   40 mg at 09/19/17 0900   .  morphine injection 2 mg  2 mg Intravenous Q4H PRN   2 mg at 09/19/17 0933   . nitroglycerin (NITROSTAT) SL tablet 0.4 mg  0.4 mg Sublingual Q5 Min PRN       . ondansetron (ZOFRAN) injection 4 mg  4 mg Intravenous Once PRN       . oxyCODONE  (ROXICODONE) immediate release tablet 5 mg  5 mg Oral Q6H PRN       . polyethylene glycol (MIRALAX) packet 17 g  17 g Oral Daily           Review of Systems  Constitutional  Negative for fevers or weight loss   Skin  Negative for rash   HENT  Negative for sore throat   Eyes  Negative for blurred vision   Cardiovascular  chest pain   Respiratory  Negative for SOB or cough   Gastrointestinal  see HPI   Genitourinary  Negative for dysuria, hematuria, or frequency   Musculoskeletal  Negative for joint pain   Endo  Negative for diabetes   Heme  anemia    Neurological  seizure d/o   Psych  Negative for depression       Physical Exam  BP 119/75   Pulse 61   Temp 97.2 F (36.2 C) (Oral)   Resp 16   Ht 1.778 m (5\' 10" )   Wt 139.9 kg (308 lb 8 oz)   LMP 09/13/2017   SpO2 92%   BMI 44.27 kg/m     General Appearance:    no acute distress, appears stated age and looks comfortable, morbidly obese   HEENT:    Normocephalic, without obvious abnormality, atraumatic, sclera anicteric, oral mucosa pink/moist   Lungs:     Clear to auscultation bilaterally, no wheezing/rhonchi/rales  No chest wall tenderness.    Heart:    Regular rate and rhythm, S1 and S2 normal, no murmur, rub or gallops appreciated   Abdomen:    soft, NT, ND, BS+   Rectal:   deferred    Extremities:   Extremities normal, atraumatic, no cyanosis or edema   Skin:   Skin color, texture, turgor normal, no rashes or lesions and no jaundice   Neurologic:   AAOx3, no focal deficits           Psychological: normal affect    Laboratory Data reviewed:      Recent Labs  Lab 09/19/17  0520 09/18/17  0704 09/18/17  0326  09/15/17  1430   WBC 8.66 7.81 7.79 More results in Results Review 8.29   Hgb 9.0* 9.4* 9.2* More results in Results Review 9.0*   Hematocrit 31.4* 32.9* 32.5* More results in Results Review 31.6*   Platelets 214 199 216 More results in Results Review 213   MCV 77.3* 78.1 78.9 More results in Results Review 78.0   Neutrophils  --   --   --   --  62.0    More results in Results Review = values in this interval not displayed.    Recent Labs  Lab 09/19/17  0520 09/18/17  0704 09/18/17  0326  09/15/17  1430   Sodium 137 138 138 More results in Results Review 141   Potassium 3.9 4.2 4.0 More results in Results Review 3.5   Chloride 105 104 103 More results in Results Review 107   CO2 25 26 26  More results in Results Review 27   BUN 17.0 14.0 14.0 More results in Results Review 12.0  Creatinine 0.9 0.9 0.9 More results in Results Review 0.9   Glucose 111* 103* 90 More results in Results Review 84   Calcium 8.5 8.7 8.6 More results in Results Review 9.0   Protein, Total  --   --   --   --  7.9   Albumin  --   --   --   --  3.9   AST (SGOT)  --   --   --   --  14   ALT  --   --   --   --  16   Alkaline Phosphatase  --   --   --   --  88   Bilirubin, Total  --   --   --   --  0.7   More results in Results Review = values in this interval not displayed.  Glucose:    Recent Labs  Lab 09/19/17  0520 09/18/17  0704 09/18/17  0326 09/17/17  0539 09/15/17  1430   Glucose 111* 103* 90 103* 84       Recent Labs  Lab 09/15/17  1430   PT 13.2   PT INR 1.0   PTT 32       Recent Labs  Lab 09/18/17  1257 09/16/17  0338 09/15/17  2128   Troponin I <0.01 <0.01 <0.01     Radiological Imaging reviewed:    NUCLEAR STRESS TEST  IMPRESSIONS   Normal stress and rest myocardial perfusion study without evidence of inducible ischemia or scar.     Normal left ventricular function with calculated EF 66%.     Breast attenuation is present   No prior studies are available for comparison.

## 2017-09-20 LAB — URINALYSIS WITH MICROSCOPIC
Bilirubin, UA: NEGATIVE
Blood, UA: NEGATIVE
Glucose, UA: NEGATIVE
Ketones UA: NEGATIVE
Nitrite, UA: NEGATIVE
Protein, UR: NEGATIVE
Specific Gravity UA: 1.017 (ref 1.001–1.035)
Urine pH: 6 (ref 5.0–8.0)
Urobilinogen, UA: NEGATIVE mg/dL

## 2017-09-20 LAB — BASIC METABOLIC PANEL
Anion Gap: 8 (ref 5.0–15.0)
BUN: 13 mg/dL (ref 7.0–19.0)
CO2: 24 mEq/L (ref 22–29)
Calcium: 8.9 mg/dL (ref 8.5–10.5)
Chloride: 105 mEq/L (ref 100–111)
Creatinine: 0.8 mg/dL (ref 0.6–1.0)
Glucose: 99 mg/dL (ref 70–100)
Potassium: 4.4 mEq/L (ref 3.5–5.1)
Sodium: 137 mEq/L (ref 136–145)

## 2017-09-20 LAB — CBC
Absolute NRBC: 0 10*3/uL (ref 0.00–0.00)
Hematocrit: 31.9 % — ABNORMAL LOW (ref 34.7–43.7)
Hgb: 9.2 g/dL — ABNORMAL LOW (ref 11.4–14.8)
MCH: 22.6 pg — ABNORMAL LOW (ref 25.1–33.5)
MCHC: 28.8 g/dL — ABNORMAL LOW (ref 31.5–35.8)
MCV: 78.4 fL (ref 78.0–96.0)
MPV: 12.6 fL — ABNORMAL HIGH (ref 8.9–12.5)
Nucleated RBC: 0 /100 WBC (ref 0.0–0.0)
Platelets: 203 10*3/uL (ref 142–346)
RBC: 4.07 10*6/uL (ref 3.90–5.10)
RDW: 17 % — ABNORMAL HIGH (ref 11–15)
WBC: 7.8 10*3/uL (ref 3.10–9.50)

## 2017-09-20 LAB — GFR: EGFR: >60

## 2017-09-20 LAB — HEMOLYSIS INDEX: Hemolysis Index: 14 (ref 0–18)

## 2017-09-20 MED ORDER — CEFTRIAXONE SODIUM 1 G IJ SOLR
1.00 g | INTRAMUSCULAR | Status: DC
Start: 2017-09-20 — End: 2017-09-20

## 2017-09-20 MED ORDER — CEFUROXIME AXETIL 250 MG PO TABS
250.00 mg | ORAL_TABLET | Freq: Two times a day (BID) | ORAL | 0 refills | Status: AC
Start: 2017-09-20 — End: 2017-09-25

## 2017-09-20 NOTE — Progress Notes (Signed)
[facility]  PROGRESS NOTE   HOSPITAL     Date Time: 09/20/17 9:26 AM  Patient Name: [name] Cook, [name] Cook     Patient Active Problem List   Diagnosis   . Chest pain   . Precordial pain   . Stroke-like symptoms   . Seizures   . Facial droop   . Tension type headache   . History of CVA (cerebrovascular accident)   . Morbid obesity   . Hematemesis   . Blood loss anemia   . HLD (hyperlipidemia)   . Numbness and tingling   . Hemorrhoids   . Right arm weakness     Assessment:    Patient admitted 09/15/2017 with chest pain.   Negative troponin levels this admission   Echocardiogram (09/16/2017) revealing normal LVSF with mild LVH, normal LV wall motion, no valvular abnormalities.   NuclearST (09/17/2017) revealing normal myocardial perfusion without evidence of inducible ischemia   Chest CT (09/19/2017) without evidence of thoracic aortic aneurysm.   HTN, well-controlled    HLD, on statin   Prior TIA, on plavix   Obesity   Seizure disorder, on Keppra   Anaphylactic reaction to contrast      Recommendations:      Patient's cardiac workup is negative for etiology of chest pain.   Ongoing management of cardiac risk factors to include statin, antiplatelet therapy, anti-hypertensive medications and weight loss are recommended.    [facility] will sign off at this time, but please do not hesitate to call with further concerns.      Subjective:   Denies chest pain, SOB or palpitations.    Medications:      Scheduled Meds: PRN Meds:      aspirin 81 mg Oral Daily   atorvastatin 40 mg Oral QHS   bisacodyl 10 mg Rectal Once   clopidogrel 75 mg Oral Daily   enoxaparin 40 mg Subcutaneous QHS   levETIRAcetam 500 mg Oral Q12H SCH   lisinopril 40 mg Oral Daily   polyethylene glycol 17 g Oral Daily       Continuous Infusions:     cyclobenzaprine 10 mg TID PRN   diphenhydrAMINE 25 mg Q6H PRN   hydrALAZINE 10 mg Q3H PRN   labetalol 10 mg Q15 Min PRN   morphine 2 mg Q4H PRN   nitroglycerin 0.4 mg Q5 Min PRN   ondansetron 4  mg Once PRN   oxyCODONE 5 mg Q6H PRN         Home Meds:   Prescriptions Prior to Admission   Medication Sig Dispense Refill Last Dose   . atorvastatin (LIPITOR) 40 MG tablet Take by mouth.   09/15/2017 at 1200   . LevETIRAcetam (KEPPRA PO) Take 500 mg by mouth 2 (two) times daily.      09/15/2017 at 0800   . lisinopril (PRINIVIL,ZESTRIL) 40 MG tablet Take 40 mg by mouth daily.   09/15/2017 at 1200   . lidocaine (LIDODERM) 5 % Place 1 patch onto the skin daily. Remove & Discard patch within 12 hours or as directed by MD 6 each 0 Unknown at Unknown time   . nitroglycerin (NITROSTAT) 0.4 MG SL tablet Place under the tongue.   Unknown at Unknown time          Physical Exam:   BP 108/47   Pulse 70   Temp 97.3 F (36.3 C) (Oral)   Resp 18   Ht 1.778 m (5\' 10" )   Wt 139.9 kg (308 lb 8 oz)  LMP 09/13/2017   SpO2 98%   BMI 44.27 kg/m    O2 Device: None (Room air)    Temp (24hrs), Avg:97.5 F (36.4 C), Min:97.2 F (36.2 C), Max:97.8 F (36.6 C)      Telemetry reviewed - SR/SB     Intake and Output Summary (Last 24 hours) at Date Time    Intake/Output Summary (Last 24 hours) at 09/20/17 0926  Last data filed at 09/20/17 0650   Gross per 24 hour   Intake              200 ml   Output              600 ml   Net             -400 ml       General Appearance:  Breathing comfortably, no acute distress  Head:  normocephalic  Eyes:  EOM's intact  Neck:  No carotid bruit or jugular venous distension, brisk carotid upstroke  Lungs:  Clear to auscultation without wheezes, rhonchi or rales  Chest Wall:  No tenderness or deformity  Heart:  S1 S2 normal No S3 or S4, without murmur/rub.  PMI non displaced   Abdomen:  Obese, soft, non-tender, positive bowel sounds  Extremities:  No edema  Pulses:  2+ pedal, radial, brachial pulses equal bilateraly  Neurologic:  Alert and oriented x3, mood and affect normal  Musculoskeletal: normal strength and tone    Labs:     Recent Labs  Lab 09/18/17  1257 09/16/17  0338 09/15/17  2128   Troponin I  <0.01 <0.01 <0.01       Recent Labs  Lab 09/15/17  1430   Bilirubin, Total 0.7   Protein, Total 7.9   Albumin 3.9   ALT 16   AST (SGOT) 14           Recent Labs  Lab 09/15/17  1430   PT 13.2   PT INR 1.0   PTT 32       Recent Labs  Lab 09/20/17  0855 09/19/17  0520 09/18/17  0704   WBC 7.80 8.66 7.81   Hgb 9.2* 9.0* 9.4*   Hematocrit 31.9* 31.4* 32.9*   Platelets 203 214 199       Recent Labs  Lab 09/19/17  0520 09/18/17  0704 09/18/17  0326   Sodium 137 138 138   Potassium 3.9 4.2 4.0   Chloride 105 104 103   CO2 25 26 26    BUN 17.0 14.0 14.0   Creatinine 0.9 0.9 0.9   EGFR >60.0 >60.0 >60.0   Glucose 111* 103* 90   Calcium 8.5 8.7 8.6       Lab Results   Component Value Date    BNP 15 06/18/2015       Weight Monitoring 04/17/2017 09/15/2017 09/15/2017 09/16/2017 09/17/2017 09/18/2017 09/19/2017   Height - 177.8 cm 177.8 cm - - - -   Height Method - Stated Stated - - - -   Weight 139.254 kg 121.11 kg 141.432 kg 141.976 kg 142.157 kg 142.339 kg 139.935 kg   Weight Method Standing Scale - Standing Scale Standing Scale Standing Scale Standing Scale Standing Scale   BMI (calculated) - 38.4 kg/m2 44.8 kg/m2 - - - -         Imaging:   Radiological Procedure            Signed by: Jenel Lucks, NP      [facility]  NP Spectralink [phone] (8am-5pm)  MD Spectralink [phone] (8am-5pm)  After hours, non urgent consult line [phone]  After Hours, urgent consults [id number]

## 2017-09-20 NOTE — Plan of Care (Addendum)
Problem: Every Day - Stroke  Goal: Neurological status is stable or improving  Outcome: Progressing   09/20/17 0123   Goal/Interventions addressed this shift   Neurological status is stable or improving Monitor/assess NIH Stroke Scale;Monitor/assess/document neurological assessment (Stroke: every 4 hours);Re-assess NIH Stroke Scale for any change in status;Observe for seizure activity and initiate seizure precautions if indicated;Perform CAM Assessment   Alert and oriented x4. Still has R side weakness and decreased sensation. Neuro check done Q 4 hrs. VSS.     Problem: Chest Pain  Goal: Cardiac pain management  Outcome: Progressing   09/20/17 0123   Goal/Interventions addressed this shift   Cardiac pain management  Assess/report chest pain/or related discomfort to LIP immediately;Instruct patient to report any change in pain status;Assess pain/or related discomfort on admission, during daily assessment, before and after any intervention;Include patient and patient care companion in decisions related to pain management   Still c/o CP radiating to the lower back 9/10, Oxycodone IR given  with IV Benadryl. Slept after the Benadryl. No c/o itching or reaction to the medication. Pain down to 3/10. Refused IV Morphine this morning. Given another dose of Benadryl and Oxy IR . Explained to her that MD's make their rounds in the morning and she may the rounds because she's asleep but she said  She will be awaken by then.  Will continue to monitor.     Problem: Bladder/Voiding  Goal: Patient will experience proper bladder emptying during admission  Outcome: Progressing   09/20/17 0123   Goal/Interventions addressed this shift   Patient will experience proper bladder emptying during admission Monitor intake and output;Encourage patient to empty bladder at regular intervals;Utilize bladder scans prior to or post void as appropriate;Patient will be in and out catheterized if unable to void;Encourage non-pharmacological  interventions ( ie. Increase fluids, avoid caffeinated drinks)   UA sent due to c/o sharp pain while urinating. Yellow hazy urine sent. There is large LE seen. Hospitalist  on call aware. Waiting for orders.

## 2017-09-26 NOTE — UM Notes (Signed)
Pt [name] Cook on 09/20/17. Please provide final authorization.        [facility]      Patient: [name] Cook  Admission Date: 09/15/2017   DOB: [birth year]  Discharge Date: 09/20/2017   MRN: [id number]  Discharge Attending:Suman D Matiana     Referring Physician: Margurite Auerbach, MD  PCP: Margurite Auerbach, MD       DISCHARGE SUMMARY     Discharge Information   Admission Diagnosis:   Right arm weakness    Discharge Diagnosis:       Active Hospital Problems    Diagnosis   . Right arm weakness        Admission Condition: Guarded   Discharge Condition: Stable   Consultants: Neurology, cardiology   Functional Status: Ambulatory   Discharged to: Home     Discharge Medications:          Medication List           START taking these medications    aspirin 81 MG chewable tablet  Chew 1 tablet (81 mg total) by mouth daily     clopidogrel 75 mg tablet  Commonly known as:  PLAVIX  Take 1 tablet (75 mg total) by mouth daily     oxyCODONE 5 MG immediate release tablet  Commonly known as:  ROXICODONE  Take 1 tablet (5 mg total) by mouth every 6 (six) hours as needed for Pain                  CONTINUE taking these medications         atorvastatin 40 MG tablet  Commonly known as:  LIPITOR     KEPPRA PO     lidocaine 5 %  Commonly known as:  LIDODERM  Place 1 patch onto the skin daily. Remove & Discard patch within 12 hours or as directed by MD     lisinopril 40 MG tablet  Commonly known as:  PRINIVIL,ZESTRIL     NITROSTAT 0.4 MG SL tablet  Generic drug:  nitroglycerin                     Where to Get Your Medications           These medications were sent to [id number] - [place], [place] - [address] [facility]  [address] [place], [place] [address]    Phone:  [phone]    aspirin 81 MG chewable tablet   clopidogrel 75 mg tablet     You can get these medications from any pharmacy    Bring a paper prescription for each of these medications   oxyCODONE 5 MG immediate release  tablet             Hospital Course   Presentation History   Per Dr Ermalinda Barrios "[name] Cook a 42 y.o.femalewith a PMHx of hypertension, hyperlipidemia, history of CVA/TIA, seizure disorderwho presented with left-sided chest pain and lower back pain since yesterday. Patient reported that she started to have sudden onset of sharp radicular 7/10 constant lower back pain associated with transient numbness of the right leg and weakness of the right arm. Patient also had sharp, constant, 6/10 left-sided chest pain not associated with shortness of breath. The pain became intense since this morning for which she came to the ED. no similar chest pain in the past. Patient had an abnormal stress test in 2014 but developed anaphylactic reaction during LHC. She denied headache, blurring  of vision, nausea, vomiting, ataxia"    See HPI for details.    Hospital Course (0 Days)   Right arm weakness/numbness  - CT head negative for acute processes  - ECHO reviewed  - continue asa and plavix per neuro, rx given for 30 days.    - op MRI (pt unable to fit into MRI machine here due to obesity)  - continue lipitor     Hypertension, well controlled  - continue lisinopril     Seizures  - continue keppra     Chest pain, resolved initially however now persists, intermittent in nature   - cardiology on board, appreciate recs  - stress test negative   - d dimer negative  - discussed pursuing TEE for pt with Dr Georgeanna Lea, he recommends that we monitor pt for now overnight, which I agree with, low suspicion of aortic dissection. Vitals are stable.   - cannot pursue CT imaging due to IV contrast allergy. Cannot pursue MRA study due to morbid obesity. Cardiology recommended that pt have CT chest and abdomen done wo contrast which have resulted as negative for any acute processes.     Return instructions given.     After I discussed [name] Cook with patient she then stated to RN that she cannot go home today because her husband is not  able to pick her up due to her son getting surgery. Seeing that there is no medical reason that pt needs further inpatient stay she has been discharged in medical stable condition. I offered several different pain medications for her chest pain which I suspect is 2/2 fibormyalgia given that it is chronic ongoing for more than 6 months. She has declined due to fear of allergy given her reactions in the past. She is only agreeable to morphine.     Pt has been discharged in medically stable condition. Cleared by cardiology and GI for op follow up and work up.     Advised that she follow up with her PCP on Friday for follow up visit     Procedures/Imaging:   Ct Abdomen Wo Contrast    Result Date: 09/19/2017   1. No CT evidence for abdominal aortic aneurysm. 2. Incidental findings as described above. Sandie Ano, MD 09/19/2017 12:36 PM    Xr Lumbar Spine Ap And Lateral    Result Date: 09/16/2017  1. Mild degenerative changes as above Charlene Brooke, MD 09/16/2017 11:26 AM    Ct Head Wo Contrast    Result Date: 09/15/2017  No CT evidence of an acute intracranial abnormality. Aldean Ast, MD 09/15/2017 3:46 PM    Ct Chest Wo Contrast    Result Date: 09/19/2017   1. No CT evidence for thoracic aortic aneurysm. 2. Incidental findings as described above. Sandie Ano, MD 09/19/2017 12:30 PM    Xr Chest Ap Portable    Result Date: 09/18/2017   Minor bibasilar atelectasis.Filbert Schilder, MD 09/18/2017 2:09 PM    Xr Chest  Ap Portable    Result Date: 09/15/2017  No acute thoracic process. Aldean Ast, MD 09/15/2017 2:44 PM    US Carotid Duplex Dopp Comp    Result Date: 09/16/2017    Normal duplex evaluation of the carotid and vertebral arteries. Dimitrios  Papadouris, MD 09/16/2017 9:26 PM           Best Practices   Was the patient admitted with either a CHF Exacerbation or Pneumonia? No  Progress Note/Physical Exam at Discharge     Subjective: Pt seen and examined at bedside. She reports chest pain radiating to her back  and shoulder. Denies nausea/vomiting. Ready to go home.     Vitals          Vitals:    09/19/17 0000 09/19/17 0400 09/19/17 0800 09/19/17 1200   BP: 118/66 105/54 119/75 131/63   Pulse: 68 61  64   Resp: 18 18 16 20    Temp: 98.3 F (36.8 C) 97.7 F (36.5 C) 97.2 F (36.2 C) 97.2 F (36.2 C)   TempSrc: Oral Oral Oral Axillary   SpO2: 97% 96% 92% 98%   Weight:   139.9 kg (308 lb 8 oz)    Height:              General: NAD, AAOx3  HEENT: perrla, eomi, sclera anicteric, OP: Clear, MMM  Neck: supple, FROM, no LAD  Cardiovascular: RRR, no m/r/g  Lungs: CTAB, no w/r/r  Abdomen: soft, +BS, NT/ND, no masses, no g/r  Extremities: no C/C/E  Skin: no rashes or lesions noted  Neuro: non focal, moves all extremities        Diagnostics     Labs/Studies Pending at Discharge: No    Last Labs     Recent Labs  Lab 09/19/17  0520 09/18/17  0704 09/18/17  0326   WBC 8.66 7.81 7.79   RBC 4.06 4.21 4.12   Hgb 9.0* 9.4* 9.2*   Hematocrit 31.4* 32.9* 32.5*   MCV 77.3* 78.1 78.9   Platelets 214 199 216         Recent Labs  Lab 09/19/17  0520 09/18/17  0704 09/18/17  0326 09/17/17  0539 09/15/17  1430   Sodium 137 138 138 138 141   Potassium 3.9 4.2 4.0 3.9 3.5   Chloride 105 104 103 105 107   CO2 25 26 26 27 27    BUN 17.0 14.0 14.0 12.0 12.0   Creatinine 0.9 0.9 0.9 0.9 0.9   Glucose 111* 103* 90 103* 84   Calcium 8.5 8.7 8.6 8.3* 9.0           Microbiology Results     None           Patient Instructions   Discharge Diet: cardiac   Discharge Activity: as tolerted    Follow Up Appointment:      Follow-up Information     Margurite Auerbach, MD Follow up.    Specialty:  Family Medicine  Why:  hospital follow up on FIrday   Contact information:  [address]  [place] [address]  [phone]                   Novella Olive. Coral Ceo, RN, BSN, MSN, MBA  UR Case Manager  Please Email if you have any questions  [email or web address]

## 2018-03-03 ENCOUNTER — Emergency Department: Payer: Medicaid Other

## 2018-03-03 ENCOUNTER — Observation Stay
Admission: EM | Admit: 2018-03-03 | Discharge: 2018-03-05 | Disposition: A | Discharge status: Home / Self Care | Payer: Medicaid Other | Attending: Internal Medicine | Admitting: Internal Medicine

## 2018-03-03 DIAGNOSIS — R202 Paresthesia of skin: Secondary | ICD-10-CM | POA: Insufficient documentation

## 2018-03-03 DIAGNOSIS — I160 Hypertensive urgency: Secondary | ICD-10-CM

## 2018-03-03 DIAGNOSIS — R519 Headache, unspecified: Secondary | ICD-10-CM

## 2018-03-03 DIAGNOSIS — R9401 Abnormal electroencephalogram [EEG]: Secondary | ICD-10-CM

## 2018-03-03 DIAGNOSIS — R200 Anesthesia of skin: Secondary | ICD-10-CM

## 2018-03-03 DIAGNOSIS — Z6841 Body Mass Index (BMI) 40.0 and over, adult: Secondary | ICD-10-CM | POA: Diagnosis present

## 2018-03-03 DIAGNOSIS — I10 Essential (primary) hypertension: Secondary | ICD-10-CM | POA: Insufficient documentation

## 2018-03-03 DIAGNOSIS — G40909 Epilepsy, unspecified, not intractable, without status epilepticus: Secondary | ICD-10-CM | POA: Insufficient documentation

## 2018-03-03 DIAGNOSIS — Z8673 Personal history of transient ischemic attack (TIA), and cerebral infarction without residual deficits: Secondary | ICD-10-CM | POA: Insufficient documentation

## 2018-03-03 DIAGNOSIS — R29810 Facial weakness: Principal | ICD-10-CM | POA: Insufficient documentation

## 2018-03-03 DIAGNOSIS — E7800 Pure hypercholesterolemia, unspecified: Secondary | ICD-10-CM | POA: Insufficient documentation

## 2018-03-03 LAB — CBC AND DIFFERENTIAL
Absolute NRBC: 0 10*3/uL (ref 0.00–0.00)
Basophils Absolute Automated: 0.05 10*3/uL (ref 0.00–0.08)
Basophils Automated: 0.7 %
Eosinophils Absolute Automated: 0.27 10*3/uL (ref 0.00–0.44)
Eosinophils Automated: 3.6 %
Hematocrit: 29 % — ABNORMAL LOW (ref 34.7–43.7)
Hgb: 8.4 g/dL — ABNORMAL LOW (ref 11.4–14.8)
Immature Granulocytes Absolute: 0.02 10*3/uL (ref 0.00–0.07)
Immature Granulocytes: 0.3 %
Lymphocytes Absolute Automated: 2.34 10*3/uL (ref 0.42–3.22)
Lymphocytes Automated: 30.9 %
MCH: 22.2 pg — ABNORMAL LOW (ref 25.1–33.5)
MCHC: 29 g/dL — ABNORMAL LOW (ref 31.5–35.8)
MCV: 76.5 fL — ABNORMAL LOW (ref 78.0–96.0)
Monocytes Absolute Automated: 0.48 10*3/uL (ref 0.21–0.85)
Monocytes: 6.3 %
Neutrophils Absolute: 4.41 10*3/uL (ref 1.10–6.33)
Neutrophils: 58.2 %
Nucleated RBC: 0 /100 WBC (ref 0.0–0.0)
Platelets: 188 10*3/uL (ref 142–346)
RBC: 3.79 10*6/uL — ABNORMAL LOW (ref 3.90–5.10)
RDW: 17 % — ABNORMAL HIGH (ref 11–15)
WBC: 7.57 10*3/uL (ref 3.10–9.50)

## 2018-03-03 LAB — TROPONIN I: Troponin I: <0.01 ng/mL (ref 0.00–0.05)

## 2018-03-03 LAB — COMPREHENSIVE METABOLIC PANEL
ALT: 16 U/L (ref 0–55)
AST (SGOT): 31 U/L (ref 5–34)
Albumin/Globulin Ratio: 0.8 — ABNORMAL LOW (ref 0.9–2.2)
Albumin: 3.7 g/dL (ref 3.5–5.0)
Alkaline Phosphatase: 71 U/L (ref 37–106)
Anion Gap: 7 (ref 5.0–15.0)
BUN: 12 mg/dL (ref 7.0–19.0)
Bilirubin, Total: 0.3 mg/dL (ref 0.2–1.2)
CO2: 25 mEq/L (ref 22–29)
Calcium: 8.9 mg/dL (ref 8.5–10.5)
Chloride: 109 mEq/L (ref 100–111)
Creatinine: 0.9 mg/dL (ref 0.6–1.0)
Globulin: 4.5 g/dL — ABNORMAL HIGH (ref 2.0–3.6)
Glucose: 85 mg/dL (ref 70–100)
Potassium: 4.2 mEq/L (ref 3.5–5.1)
Protein, Total: 8.2 g/dL (ref 6.0–8.3)
Sodium: 141 mEq/L (ref 136–145)

## 2018-03-03 LAB — PT/INR
PT INR: 1 (ref 0.9–1.1)
PT: 12.6 s (ref 12.6–15.0)

## 2018-03-03 LAB — HEMOLYSIS INDEX: Hemolysis Index: 145 — ABNORMAL HIGH (ref 0–18)

## 2018-03-03 LAB — APTT: PTT: 29 s (ref 23–37)

## 2018-03-03 LAB — GFR: EGFR: >60

## 2018-03-03 MED ORDER — MORPHINE SULFATE 10 MG/ML IJ/IV SOLN (WRAP)
6.0000 mg | Freq: Once | Status: AC
Start: 2018-03-03 — End: 2018-03-03
  Administered 2018-03-03: 6 mg via INTRAVENOUS
  Filled 2018-03-03: qty 1

## 2018-03-03 MED ORDER — ASPIRIN 81 MG PO CHEW
324.00 mg | CHEWABLE_TABLET | Freq: Once | ORAL | Status: AC
Start: 2018-03-03 — End: 2018-03-03
  Administered 2018-03-03: 23:00:00 324 mg via ORAL
  Filled 2018-03-03: qty 4

## 2018-03-03 NOTE — ED Provider Notes (Signed)
EMERGENCY DEPARTMENT HISTORY AND PHYSICAL EXAM     Physician/Midlevel provider first contact with patient: 03/03/18 2134         Date: 03/03/2018  Patient Name: [name] Cook    History of Presenting Illness     Dragon voice recognition system was used for dictation. This note may contain inadvertent typos.      Chief Complaint   Patient presents with   . Facial Droop   . Headache       History Provided By: Patient    Chief Complaint: Facial droop  Onset: Noticed upon waking up at noon today  Timing: Constant  Location: Right side of face  Quality: " Right side of face feels weak"  Radiation: none  Severity: Severe  Exacerbating factors: None  Alleviating factors: None  Associated Symptoms: Right facial numbness  Pertinent Negatives: see ROS    Additional History: [name] Cook is a 42 y.o. female with PMH of HLD, HTN, seizures on Keppra, previous TIA (2017), presents with right-sided facial droop and numbness that she noticed upon waking up at noon today.  Patient states she was in her usual state of health last night and reports no untoward events until waking up this morning.  She denies unilateral weakness, speech difficulty, ataxia, fever, chills, headache, CP, dyspnea, palpitations, abdominal pain, vomiting, nausea, urinary symptoms.  No recent falls, trauma or new medications.      PCP: Margurite Auerbach, MD    No current facility-administered medications for this encounter.      Current Outpatient Medications   Medication Sig Dispense Refill   . aspirin 81 MG chewable tablet Chew 1 tablet (81 mg total) by mouth daily 30 tablet 0   . atorvastatin (LIPITOR) 40 MG tablet Take by mouth.     . clopidogrel (PLAVIX) 75 mg tablet Take 1 tablet (75 mg total) by mouth daily 30 tablet 0   . LevETIRAcetam (KEPPRA PO) Take 500 mg by mouth 2 (two) times daily.        Marland Kitchen lidocaine (LIDODERM) 5 % Place 1 patch onto the skin daily. Remove & Discard patch within 12 hours or as directed by MD 6 each 0   . lisinopril  (PRINIVIL,ZESTRIL) 40 MG tablet Take 40 mg by mouth daily.     . nitroglycerin (NITROSTAT) 0.4 MG SL tablet Place under the tongue.         Past History   Past Medical History, Past Surgical History, Family History, Social History, Allergies reviewed and pertinent elements documented, if relevant to the case.     Review of Systems     Review of Systems   Constitutional: Negative for chills and fever.   HENT: Negative for congestion, sore throat and voice change.    Eyes: Negative for redness and visual disturbance.   Respiratory: Negative for cough, chest tightness and shortness of breath.    Cardiovascular: Negative for chest pain, palpitations and leg swelling.   Gastrointestinal: Negative for abdominal pain, nausea and vomiting.   Endocrine: Negative for polydipsia and polyphagia.   Genitourinary: Negative for dysuria, flank pain, frequency, urgency and vaginal bleeding.   Musculoskeletal: Negative for back pain, gait problem, neck pain and neck stiffness.   Skin: Negative for color change and rash.   Allergic/Immunologic: Negative for immunocompromised state.   Neurological: Positive for facial asymmetry and numbness. Negative for dizziness, syncope, speech difficulty, weakness and headaches.   Hematological: Negative for adenopathy.   Psychiatric/Behavioral: Negative for agitation and confusion. The patient is not  nervous/anxious.    All other systems reviewed and are negative.      Physical Exam   BP (!) 217/106   Pulse 67   Temp 98.8 F (37.1 C)   Resp 20   LMP 02/23/2018   SpO2 99%     Physical Exam   Constitutional: She is oriented to person, place, and time. She appears well-developed and well-nourished. No distress.   Well-appearing, nontoxic.   HENT:   Head: Normocephalic and atraumatic.   Mouth/Throat: Oropharynx is clear and moist.   Eyes: Pupils are equal, round, and reactive to light. Conjunctivae are normal.   Neck: Normal range of motion. Neck supple. No JVD present.   Cardiovascular: Normal  rate, regular rhythm, normal heart sounds and intact distal pulses.   Pulmonary/Chest: Effort normal and breath sounds normal. No respiratory distress.   Abdominal: Soft. Bowel sounds are normal. She exhibits no distension. There is no abdominal tenderness.   Musculoskeletal: Normal range of motion.         General: No tenderness or edema.   Neurological: She is alert and oriented to person, place, and time. She exhibits normal muscle tone.   Awake, alert, moving all extremities, GCS 15, CNs intact, MOTOR 5/5 UE/LE B, SENS - intact, no pronator drift, CBL - FTS and HTS intact, no overt dysmetria. No aphasia or dysarthria. No word-naming difficulty or word salad. NIHSS 4. ABCD2 score 5.   Skin: Skin is warm and dry. No rash noted. She is not diaphoretic.   Psychiatric: She has a normal mood and affect. Her behavior is normal.   Nursing note and vitals reviewed.        Diagnostic Study Results     Labs -  Reviewed, if relevant to the case.    Radiologic Studies -   Reviewed, if relevant to the case.      Medical Decision Making   I am the first provider for this patient.    I reviewed the vital signs, available nursing notes, past medical history, past surgical history, family history and social history.    Vital Signs: Reviewed the patient's vital signs during ED stay.    I reviewed pt's pulse oxymetry and cardiac monitor values, as relevant to the case.    Record Review: The following, if applicable to the case, were reviewed and noted:    Old medical records.  Nursing notes.  Outside records.     EKG:  Interpreted by the Emergency Physician.   Time Interpreted: 2138   Rate: 58   Rhythm: Sinus Bradycardia    Interpretation: QRS 90, QTc 443, no ST elevations or depressions   Comparison: compared to EKG on 09/18/2017: NSR at 60 bpm    Clinical Decision Support:   NIHSS  Level Of Consciousness 0 0=Alert; keenly responsive  1=Not alert, but arousable by minor stimulation  2=Not alert, requires repeated  stimulation  3=Responds only with reflex movements   LOC Questions to Month and age 23 0=Answers both questions correctly  1=Answers one question correctly  2=Answers neither question correctly   LOC Commands       -Open/Close eyes      -Open/close grip 0 0=Performs both tasks correctly  1=Performs one task correctly  2=Performs neighter task correctly    Best Gaze 0 0=Normal  1=Partial gaze palsy  2=Forced deviation, or total gaze paresis   Visual 0 0=No visual loss  1=Partial hemianopia  2=Complete hemianopia  3=Bilateral hemianopia (blind including cortical blindness)  Facial Palsy 2 0=Normal symmetrical movement  1=Minor paralysis (asymmetry)  2=Partial paralysis (lower facde)  3=Complete paralysis (upper and lower face)   Motor: Arm and Leg 0 0=No drift, limb holds posture for full 10 seconds  1=Drift, limb holds posture, no drift to bed  2=Some antigravity effort, cannot maintain posture, drifts to bed  3=No effort against gravity, limb falls  4=No movement   Limb Ataxia 0 0=Absent  1=Present in one limb  2=Present in two limbs   Sensory 1 0=Normal  1=Mild to moderate sensory loss  2=Severe to total sensory loss   Best Language 0 0=No aphasia, normal  1=Mild to moderate aphasia  2=Severe aphasia  3=Multe, global aphasia   Dysarthria 0 0=Normal  1=Mild to moderate  2=Severe, unintelligible or mute/anarthric   Extinction/Neglect 0 0=No abnormality  1=Extinction to bilateral simultaneous stimulation  2=Profound neglect   Total   4      ABCD2 Score for TIA     Estimates risk of stroke after a TIA, according to patient risk factors.  Age ? 60? Yes +1--NO   BP ? 140/90 mmHg at initial evaluation? Yes +1--YES   Clinical Features of the TIA: Unilateral Weakness +2--YES  Speech Disturbance without Weakness +1   Other Symptoms +0   Duration of Symptoms? < 10 minutes +0   10-59 minutes +1   ? 60 minutes +2--YES   Diabetes Mellitus in Patient's History? Yes +1--NO     ABCD2 score: 5    According to the validation study,  0-5 points: Low Risk. Admit as Obs to Medical Telemetry, if there are any other reason, and use TIA MRI panel order   2-Day Stroke Risk: 1.0%.  7-Day Stroke Risk: 1.2%.  90-Day Stroke Risk: 3.1%.      According to the validation study, 6-7 points: High Risk. Admit as In-patient to Medical Telemetry, if there are any other reason, and use TIA MRI panel order      2-Day Stroke Risk: 8.1%.  7-Day Stroke Risk: 11.7%.  90-Day Stroke Risk: 17.8%.     Acute Ischemic CVA:    This patient was last known in usual state of health at:    Date: 03/02/18    Time: before midnight    Glucose fingerstick: 85    The eligibility of IV Alteplase was assessed and discussed. IV Alteplase was not given because the duration of the stroke is greater than 4.5 hours.      Critical Care Time:   CRITICAL CARE: The high probability of sudden, clinically significant deterioration in the patient's condition required the highest level of my preparedness to intervene urgently.    The services I provided to this patient were to treat and/or prevent clinically significant deterioration that could result in: death.  Services included the following: chart data review, reviewing nursing notes and/or old charts, documentation time, consultant collaboration regarding findings and treatment options, medication orders and management, direct patient care, re-evaluations, vital sign assessments and ordering, interpreting and reviewing diagnostic studies/lab tests.    Aggregate critical care time was 39 minutes, which includes only time during which I was engaged in work directly related to the patient's care, as described above, whether at the bedside or elsewhere in the Emergency Department.  It did not include time spent performing other reported procedures or the services of residents, students, nurses or physician assistants.      For Hospitalized Patients:     1. Hospitalization Decision Time:  The decision to admit this  patient was made by the emergency  provider at 2216 on 03/03/2018      2. Core Measures:   - 12-lead EKG was performed in the ED. Aspirin: Aspirin was given.    SEP-1 Exclusion Criteria  Pt is excluded from SEP-1 Core Measure at 2203 and for the duration of the ED stay, up to and including the time of departure from the ED. All potential SIRS and abnormal vitals signs (including any possible hypotension during ED stay) and SEP-1-qualifying organ dysfunction on ED labs, including potential BiPAP requirement or respiratory failure, are not due to infection/sepsis, but are due to TIA.    If this patient was febrile or hypothermic and had a confirmed or suspected infection in the ED, patient is still excluded from SEP-1 algorithm, as this case was determined by the MD/DO/APP not to have met severe sepsis or septic shock criteria during pt's ED stay.     The timestamp from the preceding paragraph above also applies to every infectious and/or SEP-1-related diagnosis in Clinical Impression or MDM sections of this note.       ED Course:     2201: Pt agrees with the plan for admission. CT head negative, will proceed with ASA. C/o CP but EKG nonischemic and troponin wnl. Allergy to NSAIDs listed, but pt states she had ASA previously without issues.     2253: Spoke with Dr. Oran Rein, who accepted patient for admission, IMCU Obs, unit 25.     Provider Notes: 42 y.o. female presents with right-sided facial droop and numbness upon waking up at noon today.  NIHSS 4.  ABCD2 score 5.  Hypertensive urgency on arrival, but BP improved without chemical intervention during patient's ED stay. STAT head CT without acute findings.  tPA not given due to patient being out of the window for this intervention.  She is not a candidate for endovascular treatment.  Labwork reassuring.  Patient complained of chest discomfort, but EKG without ischemia and troponin normal.  In the ED patient received morphine with improvement of pain, as well as full dose aspirin per stroke protocol.   Case discussed with [facility], who accepted patient for admission.  Results reviewed with patient and she agrees with the plan.  Patient is stable for transport upstairs.        Diagnosis     Clinical Impression:   1. right sided facial droop    2. Hypertensive urgency    3. Right facial numbness        Treatment Plan:   ED Disposition     ED Disposition Condition Date/Time Comment    Observation  Sun Mar 03, 2018 10:53 PM Admitting Physician: Alice Reichert [[id number]]   Diagnosis: Facial droop [604540]   Estimated Length of Stay: < 2 midnights   Tentative Discharge Plan?: Home or Self Care [1]   Patient Class: Observation [104]   Bed request comments: unit 25                  Attestations: This note is prepared by Ardis Hughs, MD, PHD, FACEP.      Juliane Poot, MD PhD  03/04/18 [phone]

## 2018-03-03 NOTE — H&P (Signed)
[facility]      Patient: [name] Cook  Date: 03/03/2018   DOB: 1976-03-01  Admission Date: 03/03/2018   MRN: [id number]  Attending: Alice Cook         Chief Complaint   Patient presents with   . Facial Droop   . Headache      History Gathered From: Patient    HISTORY AND PHYSICAL     [name] Cook is a 42 y.o. female with a history of seizures, hypertension, hyperlipidemia, TIA, and stroke (2015, 2017) presenting with right facial weakness.  Patient states that she woke up this afternoon with right-sided facial weakness.  She also endorses numbness and tingling in her right face and right upper extremity.  She notes some minor reduction in strength in her right arm and leg.  Symptoms are associated with headache, but denies speech abnormality, vision change/loss, or confusion.  Additionally, 2 hours ago she developed chest pain radiating into her left arm.  She denies shortness of breath, abdominal pain, nausea or vomiting..    Past Medical History:   Diagnosis Date   . Hypercholesteremia    . Hypertension    . Seizures    . Stroke     2015 and 2017, right side   . TIA (transient ischemic attack) 2017       Past Surgical History:   Procedure Laterality Date   . APPENDECTOMY     . CHOLECYSTECTOMY     . EGD, BIOPSY N/A 04/16/2017    Procedure: EGD, BIOPSY;  Surgeon: [name] Proud, MD;  Location: [facility];  Service: Gastroenterology;  Laterality: N/A;       Prior to Admission medications    Medication Sig Start Date End Date Taking? Authorizing Provider   aspirin 81 MG chewable tablet Chew 1 tablet (81 mg total) by mouth daily 09/19/17   [name] Hutching D, MD   atorvastatin (LIPITOR) 40 MG tablet Take by mouth.    [provider]   clopidogrel (PLAVIX) 75 mg tablet Take 1 tablet (75 mg total) by mouth daily 09/19/17   [name] Hutching D, MD   LevETIRAcetam (KEPPRA PO) Take 500 mg by mouth 2 (two) times daily.       [provider]   lidocaine (LIDODERM) 5 % Place 1 patch onto the  skin daily. Remove & Discard patch within 12 hours or as directed by MD 06/19/15   Cook, [name] K, MD   lisinopril (PRINIVIL,ZESTRIL) 40 MG tablet Take 40 mg by mouth daily.    [provider]   nitroglycerin (NITROSTAT) 0.4 MG SL tablet Place under the tongue. 10/24/14   [provider]       Allergies   Allergen Reactions   . Contrast [Iodinated Diagnostic Agents] Anaphylaxis     Patient woke up in ICU after having CT with contrast, last remembers being in CT   . Fioricet [Butalbital-Apap-Caffeine] Hives   . Iodine    . Motrin [Ibuprofen] Swelling   . Percocet [Oxycodone-Acetaminophen]    . Shellfish-Derived Products    . Toradol [Ketorolac Tromethamine]    . Tramadol    . Tylenol [Acetaminophen] Hives       PRIMARY CARE MD: [name] Lager, MD    Family History   Problem Relation Age of Onset   . Myocardial Infarction Father 75   . Deep vein thrombosis Father          Social History     Tobacco Use   . Smoking  status: Never Smoker   . Smokeless tobacco: Never Used   Substance Use Topics   . Alcohol use: No   . Drug use: No       REVIEW OF SYSTEMS   Pertinent positives are noted in HPI. Review of Systems completed and all other systems were reviewed and are negative.     PHYSICAL EXAM   Vital Signs (most recent): BP 170/81   Pulse 66   Temp 98.1 F (36.7 C) (Oral)   Resp 18   LMP 02/23/2018   SpO2 97%     Constitutional: No apparent distress.    HEENT:  PERRL, no scleral icterus or conjunctival pallor, MMM, oropharynx without erythema or exudate  Neck: supple, no cervical or supraclavicular lymphadenopathy or masses  Cardiovascular: RRR, normal S1 S2, no murmurs, gallops, no JVD. No edema.  Respiratory: Normal rate. No increased work of breathing. Clear to auscultation and percussion bilaterally.   Gastrointestinal: +BS, non-distended, soft, non-tender, no rebound or guarding, no hepatosplenomegaly  Genitourinary: no suprapubic or costovertebral angle tenderness  Musculoskeletal:  No clubbing or  cyanosis. DP and radial pulses 2+ and symmetric.  Skin: warm and perfused. No rash or lesions.  Neurologic: AAOx3, EOMI, right lower facial weakness, 4 out of 5 strength in right upper and lower extremity  Psychiatric: Normal mood and affect. The patient is alert, interactive, appropriate.    Lines/Drains/Airways:  Patient Lines/Drains/Airways Status    Active Lines, Drains and Airways     Name:   Placement date:   Placement time:   Site:   Days:    Peripheral IV 03/03/18 Left Antecubital   03/03/18    2123    Antecubital   less than 1                Exam done by [name] Reichert, MD on 03/03/18 at 11:35 PM    LABS & IMAGING     Recent Labs     03/03/18  2129   WBC 7.57   Hgb 8.4*   Hematocrit 29.0*   Platelets 188     Recent Labs     03/03/18  2129   PT 12.6   PT INR 1.0   PTT 29    Recent Labs   Lab 03/03/18  2129   Sodium 141   Potassium 4.2   Chloride 109   CO2 25   BUN 12.0   Creatinine 0.9   Calcium 8.9   Albumin 3.7   Protein, Total 8.2   Bilirubin, Total 0.3   Alkaline Phosphatase 71   ALT 16   AST (SGOT) 31   Glucose 85             Microbiology:   Microbiology Results     None          Imaging:  Ct Head Wo Contrast    Result Date: 03/03/2018   No acute process seen. No acute interval change is noted compared with prior studies and reports. [name] Brooke, MD 03/03/2018 9:43 PM    Chest Ap Portable    Result Date: 03/03/2018   1.  No definite radiographic evidence of acute cardiopulmonary disease. [name] Felts, MD 03/03/2018 9:54 PM         ASSESSMENT & PLAN     [name] Cook is a 42 y.o. female with a history of seizures, hypertension, hyperlipidemia, TIA, and stroke  presenting with acute onset right facial weakness with paresthesias concerning for CVA    #  Right facial weakness  #Right-sided paresthesias  #Hypertension  #Hyperlipidemia  #History of TIA/CVA  -Consult neurology   -Obtain brain MRI/MRA head and neck  -Risk stratification with hemoglobin A1c and lipid panel  -Start aspirin and  atorvastatin  -Continue Plavix  -Hold lisinopril  -Permissive hypertension, give hydralazine IV as needed  -Continue to monitor on telemetry  -Neuro checks every 4 hours  -PT/OT consult     #Seizure disorder  -Continue Keppra    # Nutrition  Cardiac diet    # VTE Prophylaxis  Lovenox    # CODE STATUS: Full Code    Anticipated medical stability for discharge: 2 to 3 days    Service status/Reason for ongoing hospitalization: Rule out CVA  Anticipated Discharge Needs: none    Signed,  [name] Cook    03/03/2018 11:35 PM  Time Elapsed:  45 mins

## 2018-03-03 NOTE — ED Notes (Addendum)
[facility] ED NURSING NOTE FOR THE RECEIVING INPATIENT NURSE   ED NURSE Aiyanna Awtrey   [place] [address]   ED CHARGE RN [phone]   ADMISSION INFORMATION   [name] Cook is a 42 y.o. female admitted with a diagnosis of:    1. right sided facial droop         Isolation: None   Allergies: Contrast [iodinated diagnostic agents]; Fioricet [butalbital-apap-caffeine]; Iodine; Motrin [ibuprofen]; Percocet [oxycodone-acetaminophen]; Shellfish-derived products; Toradol [ketorolac tromethamine]; Tramadol; and Tylenol [acetaminophen]   Holding Orders confirmed? Yes   Belongings Documented? Yes   Home medications sent to pharmacy confirmed? No   NURSING CARE   Mental Status: alert   ADL: Needs assistance with ADLs   Ambulation: mild difficulty   Pertinent Information  and Safety Concerns: Pt is able to ambulate on her own. Pt has right sided droop. Pt BP is 170/81. Pt NPO due to drooping.     ED Medications: See [facility] for medications administered in the ED   CT / NIH   CT Head ordered on this patient?  Yes   NIH/Dysphagia assessment done prior to admission? Yes    VITAL SIGNS   Time BP Temp Pulse Resp SpO2   2302 170/81 98.1 66 18 97   IV LINES   IV Catheter Size: 20 g  Peripheral IV 03/03/18 Left Antecubital (Active)   Site Assessment Clean;Dry;Intact 03/03/2018  9:23 PM   Line Status Saline Locked 03/03/2018  9:23 PM   Dressing Status Clean;Dry;Intact 03/03/2018  9:23 PM   Number of days: 0        LAB RESULTS   Labs Reviewed   CBC AND DIFFERENTIAL - Abnormal; Notable for the following components:       Result Value    Hgb 8.4 (*)     Hematocrit 29.0 (*)     RBC 3.79 (*)     MCV 76.5 (*)     MCH 22.2 (*)     MCHC 29.0 (*)     RDW 17 (*)     All other components within normal limits    Narrative:     Rescheduled by [id number] at 03/03/2018 21:29 Reason: Order   [place]'d/cancelled/changed  Rescheduled by [id number] at 03/03/2018 21:33 Reason: Blood Product/Med   Infusing - draw deferred Blood Product/Med Infusing - draw deferred  Rescheduled by [id number] at  03/03/2018 21:33 Reason: Patient unavailable   COMPREHENSIVE METABOLIC PANEL - Abnormal; Notable for the following components:    Globulin 4.5 (*)     Albumin/Globulin Ratio 0.8 (*)     All other components within normal limits    Narrative:     Rescheduled by [id number] at 03/03/2018 21:29 Reason: Order   [place]'d/cancelled/changed  Rescheduled by [id number] at 03/03/2018 21:33 Reason: Blood Product/Med   Infusing - draw deferred Blood Product/Med Infusing - draw deferred  Rescheduled by [id number] at 03/03/2018 21:33 Reason: Patient unavailable   HEMOLYSIS INDEX - Abnormal; Notable for the following components:    Hemolysis Index 145 (*)     All other components within normal limits    Narrative:     Rescheduled by [id number] at 03/03/2018 21:29 Reason: Order   [employer]'d/cancelled/changed  Rescheduled by [id number] at 03/03/2018 21:33 Reason: Blood Product/Med   Infusing - draw deferred Blood Product/Med Infusing - draw deferred  Rescheduled by [id number] at 03/03/2018 21:33 Reason: Patient unavailable   TROPONIN I    Narrative:     Rescheduled by [id number] at 03/03/2018 21:29 Reason: Order   [place]'d/cancelled/changed  Rescheduled by  [id number] at 03/03/2018 21:33 Reason: Blood Product/Med   Infusing - draw deferred Blood Product/Med Infusing - draw deferred  Rescheduled by [id number] at 03/03/2018 21:33 Reason: Patient unavailable   GFR    Narrative:     Rescheduled by [id number] at 03/03/2018 21:29 Reason: Order   'd/cancelled/changed  Rescheduled by [id number] at 03/03/2018 21:33 Reason: Blood Product/Med   Infusing - draw deferred Blood Product/Med Infusing - draw deferred  Rescheduled by [id number] at 03/03/2018 21:33 Reason: Patient unavailable   APTT   PT/INR   URINALYSIS, REFLEX TO MICROSCOPIC EXAM IF INDICATED

## 2018-03-03 NOTE — ED Triage Notes (Signed)
[name] Cook is a 42 y.o. female presents to the ED with right sided facial droop since this morning. PT states she has had this done in the past before. PT states she came in tonight because she started to have chest pain.

## 2018-03-03 NOTE — ED Notes (Signed)
Stroke RN called, was informed she is outside of the window for TPA but to complete an NIHSS for potential IR. If pt scores higher than a 6 she is a candidate for IR

## 2018-03-04 ENCOUNTER — Observation Stay: Payer: Medicaid Other

## 2018-03-04 ENCOUNTER — Other Ambulatory Visit: Payer: Medicaid Other

## 2018-03-04 DIAGNOSIS — Z8673 Personal history of transient ischemic attack (TIA), and cerebral infarction without residual deficits: Secondary | ICD-10-CM

## 2018-03-04 DIAGNOSIS — R9401 Abnormal electroencephalogram [EEG]: Secondary | ICD-10-CM

## 2018-03-04 DIAGNOSIS — R29810 Facial weakness: Secondary | ICD-10-CM

## 2018-03-04 DIAGNOSIS — R29898 Other symptoms and signs involving the musculoskeletal system: Secondary | ICD-10-CM

## 2018-03-04 LAB — CBC
Absolute NRBC: 0 10*3/uL (ref 0.00–0.00)
Hematocrit: 28.1 % — ABNORMAL LOW (ref 34.7–43.7)
Hgb: 7.9 g/dL — ABNORMAL LOW (ref 11.4–14.8)
MCH: 21.4 pg — ABNORMAL LOW (ref 25.1–33.5)
MCHC: 28.1 g/dL — ABNORMAL LOW (ref 31.5–35.8)
MCV: 75.9 fL — ABNORMAL LOW (ref 78.0–96.0)
MPV: 12.4 fL (ref 8.9–12.5)
Nucleated RBC: 0 /100 WBC (ref 0.0–0.0)
Platelets: 162 10*3/uL (ref 142–346)
RBC: 3.7 10*6/uL — ABNORMAL LOW (ref 3.90–5.10)
RDW: 17 % — ABNORMAL HIGH (ref 11–15)
WBC: 8.04 10*3/uL (ref 3.10–9.50)

## 2018-03-04 LAB — URINALYSIS, REFLEX TO MICROSCOPIC EXAM IF INDICATED
Bilirubin, UA: NEGATIVE
Glucose, UA: NEGATIVE
Ketones UA: NEGATIVE
Nitrite, UA: NEGATIVE
Protein, UR: 30 — AB
Specific Gravity UA: 1.024 (ref 1.001–1.035)
Urine pH: 6 (ref 5.0–8.0)
Urobilinogen, UA: 2 mg/dL (ref 0.2–2.0)

## 2018-03-04 LAB — BASIC METABOLIC PANEL
Anion Gap: 6 (ref 5.0–15.0)
BUN: 13 mg/dL (ref 7.0–19.0)
CO2: 26 mEq/L (ref 22–29)
Calcium: 8.8 mg/dL (ref 8.5–10.5)
Chloride: 109 mEq/L (ref 100–111)
Creatinine: 0.8 mg/dL (ref 0.6–1.0)
Glucose: 118 mg/dL — ABNORMAL HIGH (ref 70–100)
Potassium: 3.6 mEq/L (ref 3.5–5.1)
Sodium: 141 mEq/L (ref 136–145)

## 2018-03-04 LAB — ECG 12-LEAD
Atrial Rate: 58 {beats}/min
P Axis: 36 degrees
P-R Interval: 156 ms
Q-T Interval: 452 ms
QRS Duration: 90 ms
QTC Calculation (Bezet): 443 ms
R Axis: 7 degrees
T Axis: -5 degrees
Ventricular Rate: 58 {beats}/min

## 2018-03-04 LAB — LIPID PANEL
Cholesterol / HDL Ratio: 4.4
Cholesterol: 151 mg/dL (ref 0–199)
HDL: 34 mg/dL — ABNORMAL LOW (ref 40–9999)
LDL Calculated: 102 mg/dL — ABNORMAL HIGH (ref 0–99)
Triglycerides: 77 mg/dL (ref 34–149)
VLDL Calculated: 15 mg/dL (ref 10–40)

## 2018-03-04 LAB — TSH: TSH: 3.22 u[IU]/mL (ref 0.35–4.94)

## 2018-03-04 LAB — HEMOLYSIS INDEX
Hemolysis Index: 0 (ref 0–18)
Hemolysis Index: 2 (ref 0–18)

## 2018-03-04 LAB — TROPONIN I
Troponin I: <0.01 ng/mL (ref 0.00–0.05)
Troponin I: <0.01 ng/mL (ref 0.00–0.05)

## 2018-03-04 LAB — HEMOGLOBIN A1C
Average Estimated Glucose: 102.5 mg/dL
Hemoglobin A1C: 5.2 % (ref 4.6–5.9)

## 2018-03-04 LAB — GFR: EGFR: >60

## 2018-03-04 MED ORDER — CLOPIDOGREL BISULFATE 75 MG PO TABS
75.0000 mg | ORAL_TABLET | Freq: Every day | ORAL | Status: DC
Start: 2018-03-04 — End: 2018-03-05
  Administered 2018-03-04 – 2018-03-05 (×2): 75 mg via ORAL
  Filled 2018-03-04 (×2): qty 1

## 2018-03-04 MED ORDER — ONDANSETRON HCL 4 MG/2ML IJ SOLN
4.00 mg | Freq: Three times a day (TID) | INTRAMUSCULAR | Status: DC | PRN
Start: 2018-03-04 — End: 2018-03-04

## 2018-03-04 MED ORDER — LEVETIRACETAM 500 MG PO TABS
500.0000 mg | ORAL_TABLET | Freq: Two times a day (BID) | ORAL | Status: DC
Start: 2018-03-04 — End: 2018-03-05
  Administered 2018-03-04 – 2018-03-05 (×3): 500 mg via ORAL
  Filled 2018-03-04 (×3): qty 1

## 2018-03-04 MED ORDER — HYDRALAZINE HCL 20 MG/ML IJ SOLN
10.00 mg | INTRAMUSCULAR | Status: DC | PRN
Start: 2018-03-04 — End: 2018-03-05

## 2018-03-04 MED ORDER — SENNOSIDES-DOCUSATE SODIUM 8.6-50 MG PO TABS
2.0000 | ORAL_TABLET | Freq: Every evening | ORAL | Status: DC | PRN
Start: 2018-03-04 — End: 2018-03-05

## 2018-03-04 MED ORDER — ONDANSETRON HCL 4 MG/2ML IJ SOLN
4.00 mg | Freq: Four times a day (QID) | INTRAMUSCULAR | Status: DC | PRN
Start: 2018-03-04 — End: 2018-03-05

## 2018-03-04 MED ORDER — ASPIRIN 81 MG PO CHEW
81.00 mg | CHEWABLE_TABLET | Freq: Every day | ORAL | Status: DC
Start: 2018-03-04 — End: 2018-03-05
  Administered 2018-03-04 – 2018-03-05 (×2): 81 mg via ORAL
  Filled 2018-03-04 (×2): qty 1

## 2018-03-04 MED ORDER — NITROGLYCERIN 0.4 MG SL SUBL
0.40 mg | SUBLINGUAL_TABLET | SUBLINGUAL | Status: DC | PRN
Start: 2018-03-04 — End: 2018-03-05

## 2018-03-04 MED ORDER — ATORVASTATIN CALCIUM 80 MG PO TABS
80.0000 mg | ORAL_TABLET | Freq: Every evening | ORAL | Status: DC
Start: 2018-03-04 — End: 2018-03-05
  Administered 2018-03-04: 22:00:00 80 mg via ORAL
  Filled 2018-03-04: qty 1

## 2018-03-04 MED ORDER — ACETAMINOPHEN 325 MG PO TABS
650.0000 mg | ORAL_TABLET | ORAL | Status: DC | PRN
Start: 2018-03-04 — End: 2018-03-05

## 2018-03-04 MED ORDER — ONDANSETRON HCL 4 MG/2ML IJ SOLN
4.00 mg | Freq: Once | INTRAMUSCULAR | Status: DC | PRN
Start: 2018-03-04 — End: 2018-03-04

## 2018-03-04 MED ORDER — ENOXAPARIN SODIUM 40 MG/0.4ML SC SOLN
40.00 mg | Freq: Every day | SUBCUTANEOUS | Status: DC
Start: 2018-03-04 — End: 2018-03-05
  Administered 2018-03-04 – 2018-03-05 (×2): 40 mg via SUBCUTANEOUS
  Filled 2018-03-04 (×2): qty 0.4

## 2018-03-04 MED ORDER — MORPHINE SULFATE 4 MG/ML IJ/IV SOLN (WRAP)
4.0000 mg | Status: AC | PRN
Start: 2018-03-04 — End: 2018-03-04
  Administered 2018-03-04 (×4): 4 mg via INTRAVENOUS
  Filled 2018-03-04 (×5): qty 1

## 2018-03-04 MED ORDER — NALOXONE HCL 0.4 MG/ML IJ SOLN (WRAP)
0.20 mg | INTRAMUSCULAR | Status: DC | PRN
Start: 2018-03-04 — End: 2018-03-05

## 2018-03-04 MED ORDER — ONDANSETRON 4 MG PO TBDP
4.00 mg | ORAL_TABLET | Freq: Four times a day (QID) | ORAL | Status: DC | PRN
Start: 2018-03-04 — End: 2018-03-05

## 2018-03-04 MED ORDER — SODIUM CHLORIDE 0.9 % IV SOLN
500.00 mg | Freq: Two times a day (BID) | INTRAVENOUS | Status: DC
Start: 2018-03-04 — End: 2018-03-05
  Filled 2018-03-04: qty 5

## 2018-03-04 MED ORDER — ACETAMINOPHEN 650 MG RE SUPP
650.00 mg | RECTAL | Status: DC | PRN
Start: 2018-03-04 — End: 2018-03-05

## 2018-03-04 MED ORDER — LORAZEPAM 2 MG/ML IJ SOLN
1.00 mg | Freq: Once | INTRAMUSCULAR | Status: AC
Start: 2018-03-04 — End: 2018-03-04
  Administered 2018-03-04: 10:00:00 1 mg via INTRAVENOUS
  Filled 2018-03-04: qty 1

## 2018-03-04 MED ORDER — ATORVASTATIN CALCIUM 40 MG PO TABS
40.0000 mg | ORAL_TABLET | Freq: Every evening | ORAL | Status: DC
Start: 2018-03-04 — End: 2018-03-04

## 2018-03-04 NOTE — Progress Notes (Signed)
Addendum to my earlier note  Patient unable to do MRI because she had recently pierced her nose with a metal jewelry piece  In view of her inability to proceed with MRI and EEG as well as a repeat CT scan has been ordered for tonight by neurology  Disposition will be based on findings of the above studies

## 2018-03-04 NOTE — Procedures (Signed)
NEURODIAGNOSTIC LABORATORY  ROUTINE ELECTROENCEPHALOGRAM     EEG   Date/Time:      03/04/2018  Patient Name: [name] Cook, [name] Cook  DOB: 1976/01/06  MWU:[id number]  MRN: [id number]  Interpretation/Impression:   The background activity is low voltage  continuous symmetric voltage reactive.  EEG recorded awake drowsy asleep. PS  performed produced no abnormality.  There are no seizures or other clinical events noted in chart or annotation viewer. No "push button" events. No epileptiform discharges, focal slowing or spike waves are seen.  Abnormal EEG suggesting encephalopathy. No focal abnormality.  No seizures.  Assessment:     Patient Active Problem List   Diagnosis   . Chest pain   . Precordial pain   . Stroke-like symptoms   . Seizures   . Facial droop   . Tension type headache   . History of CVA (cerebrovascular accident)   . Morbid obesity   . Hematemesis   . Blood loss anemia   . HLD (hyperlipidemia)   . Numbness and tingling   . Hemorrhoids   . Right arm weakness   . Nonspecific abnormal electroencephalogram (EEG)     Medications:     Current Facility-Administered Medications   Medication Dose Route Frequency   . aspirin  81 mg Oral Daily   . atorvastatin  80 mg Oral QHS   . clopidogrel  75 mg Oral Daily   . enoxaparin  40 mg Subcutaneous Daily   . levETIRAcetam  500 mg Oral Q12H    Or   . levETIRAcetam  500 mg Intravenous Q12H        Alvy Bimler) Berna Bue, MD, FACP, [place]  Chief Neurology Section [facility]  Medical Director Cerebrovascular Services, [facility] Director Neurology, [facility]  Outpatient [phone]  Inpatient [phone]

## 2018-03-04 NOTE — PT Eval Note (Signed)
[facility]  Physical Therapy Evaluation and Treatment    Patient: [name] Cook  MRN#: [id number]  Unit: [facility]  Bed: A2506/A2506-B    Time of Evaluation and Treatment:  Time Calculation  PT Received On: 03/04/18  Start Time: 1327  Stop Time: 1355  Time Calculation (min): 28 min    Evaluation Time: 15 minutes  Treatment Time: 13 minutes    Chart Review and Collaboration with Care Team: 8 minutes, not included in above time    PT Visit Number: 1    Consult received for [name] for PT Evaluation and Treatment.  Patient's medical condition is appropriate for Physical therapy intervention at this time.    Activity Orders:  PT eval and treat, progressive mobility protocol and activity as tolerated    Precautions and Contraindications:  Precautions  Weight Bearing Status: no restrictions  Precaution Instructions Given to Patient: Yes  Other Precautions: falls, elevated BP, nausea     Medical Diagnosis:  Facial droop [R29.810]    History of Present Illness:  [name] Cook is a 42 y.o. female admitted on 03/03/2018 presenting with right facial weakness and paresthesias followed by UE/LE weakness. Prior stroke hx (2015, 2017). CT head negative for any acute abnormality, second head CT ordered (2/2 unable to complete MRI d/t nose ring) to evaluate for evidence of possible seizure with post-ictal weakness as cause of symptoms.    Patient Active Problem List   Diagnosis   . Chest pain   . Precordial pain   . Stroke-like symptoms   . Seizures   . Facial droop   . Tension type headache   . History of CVA (cerebrovascular accident)   . Morbid obesity   . Hematemesis   . Blood loss anemia   . HLD (hyperlipidemia)   . Numbness and tingling   . Hemorrhoids   . Right arm weakness       Past Medical/Surgical History:  Past Medical History:   Diagnosis Date   . Hypercholesteremia    . Hypertension    . Seizures    . Stroke     2015 and 2017, right side   . TIA (transient ischemic attack) 2017     Past  Surgical History:   Procedure Laterality Date   . APPENDECTOMY     . CHOLECYSTECTOMY     . EGD, BIOPSY N/A 04/16/2017    Procedure: EGD, BIOPSY;  Surgeon: Pershing Proud, MD;  Location: [facility];  Service: Gastroenterology;  Laterality: N/A;       X-Rays/Tests/Labs:  Lab Results   Component Value Date/Time    HGB 7.9 (L) 03/04/2018 01:10 AM    HCT 28.1 (L) 03/04/2018 01:10 AM    K 3.6 03/04/2018 01:10 AM    NA 141 03/04/2018 01:10 AM    INR 1.0 03/03/2018 09:29 PM    TROPI <0.01 03/04/2018 04:07 AM    TROPI <0.01 03/04/2018 01:10 AM    TROPI <0.01 03/03/2018 09:29 PM    TROPI <0.01 09/18/2017 12:57 PM       All imaging reviewed, please see chart for details.    Social History:  Prior Level of Function  Prior level of function: Independent with ADLs, Ambulates independently  Baseline Activity Level: Community ambulation  DME Currently at Home: (None)    Home Living Arrangements  Living Arrangements: Spouse/significant other  Type of Home: House  Home Layout: Multi-level, Stairs to enter with rails (add number in comment)(6 STE B rails; 1 flight  to bedroom)  Bathroom Equipment: ( None)  DME Currently at Home: (None)  Home Living - Notes / Comments: Assistance from husband if needed      Subjective:  Patient is agreeable to participation in the therapy session. Family and/or guardian are agreeable to patient's participation in the therapy session. Nursing clears patient for therapy.  Patient Goal: To go home  Pain Assessment  Pain Assessment: No/denies pain      Objective:  Observation of Patient/Vital Signs:  Blood pressure 156/65, pulse 70, SpO2 98 %.; RN notified of elevated BP.      Patient received in bed with telemetry  in place. Spouse present.    Cognitive Status and Neuro Exam:  Cognition/Neuro Status  Arousal/Alertness: Appropriate responses to stimuli  Attention Span: Appears intact  Orientation Level: Oriented X4  Memory: Appears intact  Safety Awareness: minimal verbal instruction  Insights:  Decreased awareness of deficits  Behavior: calm;cooperative;flat affect  Motor Planning: intact  Hand Dominance: right handed    Musculoskeletal Examination  Gross ROM  Right Lower Extremity ROM: within functional limits  Left Lower Extremity ROM: within functional limits  Gross Strength  Right Lower Extremity Strength: 3+/5(grossly; decreased strength compared to L LE)  Left Lower Extremity Strength: 5/5(grossly)     Functional Mobility:  Functional Mobility  Supine to Sit: Stand by Assist  Scooting to EOB: Supervision  Sit to Stand: Contact Guard Assist  Stand to Sit: Stand by Assist     Locomotion  Ambulation: Contact Guard Assist(5 ft in room)  Pattern: decreased cadence;decreased step length(R toe out; Impaired heel strike on R during initial contact)  Stair Management: Supervision;one rail R(Simulated stairs via standin marches; 10 reps BL)  Number of Stairs: 10     Coordination Right Left   Finger Opposition WNL WNL   Heel Shin Rub WNL WNL     Balance  Balance  Sitting - Static: Good  Sitting - Dynamic: Good  Standing - Static: Good  Standing - Dynamic: Good(minus; requires Sup)    Participation and Activity Tolerance  Participation and Endurance  Participation Effort: good  Endurance: Endurance does not limit participation in activity  [employer] Dyspnea Scale: 0 Dyspnea    Patient left in bed with bed alarm in place and call bell and all personal items/needs within reach. RN notified of session outcome.    Assessment:  [name] Cook is a 42 y.o. female admitted 03/03/2018. Pt would benefit from Physical Therapy to address deficits and increase functional independence. There are few comorbidities or other factors that affect plan of care and require modification of task including: past hx of stroke, residual neurological symptoms, personal factors including:, challenging home environment .  Pt's functional mobility is impacted by: activity tolerance/endurance, balance, strength and nausea.   Standardized tests and exams incorporated into evaluation include AMPAC mobility, balance, cognition/orientation, coordination, ROM  and Strength.   Pt demonstrates a stable clinical presentation.      Complexity Level Hx and Co-  morbidites Examination Clinical Decision Making Clinical Presentation   Low no impact 1-2 elements Limited options Stable     Treatment:  - Pt required increased time, effort, and instruction for improved safety and technique throughout session  - Assisted pt to maximize comfort in bed and optimize positioning; pt agreeable to position at this time  - All OOB activities and ambulation were performed with gait belt for increased pt and therapist safety   - Therapeutic Activities: verbal instructions and manual facilitation for  sequencing, mechanics, and safety; performed to improve stability and strength while performing functional, dynamic, and closed chain activity  Locomotion:  - Ambulation; additional 175 ft; over 1 bout(s); CGA progressed through SBA to Sup; with no AD; similar pattern to above evaluation; pt able to perform dual task of carrying emesis bag 2/2 mild nausea  Balance:  - Static sitting balance; 5 minute(s); SBA; performed at EOB; no UE support required for stability    progressed with reaching activity  - Static standing balance; 1 minute(s); SBA; performed with no AD; for 10 reps of PLB  - Discussed the continuation of PT services while in hospital and following discharge   - Educated the pt to the role of physical therapy, plan of care, goals of therapy and safety with mobility and ADLs, discharge instructions, home safety, importance of continued ambulation, and benefits of continued mobility with pt verbalized understanding   - Discussed potential follow up with OP PT if noticing any changes in functional mobility/strength following d/c; pt verbalized understanding and agrees with current d/c recommendation of Home with Supervision and no PT  services    Plan:  Treatment/Interventions: Exercise, Gait training, Stair training, Neuromuscular re-education, Functional transfer training, LE strengthening/ROM, Endurance training, Patient/family training, Equipment eval/education, Bed mobility  PT Frequency: 1-2x/wk  Risks/Benefits/POC Discussed with Pt/Family: With patient/family    PMP Activity: Step 7 - Walks out of Room  Distance Walked (ft) (Step 6,7): 180 Feet      Goals:  Goals  Goal Formulation: With patient/family  Time for Goal Acheivement: By time of discharge  Pt Will Go Supine To Sit: independent, to maximize functional mobility and independence, Not met  Pt Will Perform Sit to Stand: independent, to maximize functional mobility and independence, Not met  Pt Will Demo Standing Balance: 5/5 indep, moves/returns center of grav in all planes > 2", Partly met  Pt Will Ambulate: 101-150 feet, modified independent, Not met(with LRAD)  Pt Will Go Up / Down Stairs: 1 flight, modified independent, to maximize functional mobility and independence, Partly met    AM-PAC:yes        PT Basic Mobility Raw Score: 18  CMS 0-100% Score: 46.58%              DME Recommended for Discharge: (No needs at this time)  Discharge Recommendation: Home with supervision    Merideth Abbey, PT, DPT [phone]  03/04/2018 2:07 PM

## 2018-03-04 NOTE — Plan of Care (Signed)
Discharge Recommendation: Home with supervision  DME Recommended for Discharge: (No needs at this time)    Is an Occupational Therapy Evaluation Indicated at this time? No, the patient does not require a OT evaluation.    Treatment/Interventions: Exercise, Gait training, Stair training, Neuromuscular re-education, Functional transfer training, LE strengthening/ROM, Endurance training, Patient/family training, Equipment eval/education, Bed mobility  PT Frequency: 1-2x/wk     Next Visit Recommendation: PT - Next Visit Recommendation: 03/06/18     PMP Activity: Step 7 - Walks out of Room  Distance Walked (ft) (Step 6,7): 180 Feet  (Please See Therapy Evaluation for device and assistance level needed)    Goals:   Goals  Goal Formulation: With patient/family  Time for Goal Acheivement: By time of discharge  Pt Will Go Supine To Sit: independent, to maximize functional mobility and independence, Not met  Pt Will Perform Sit to Stand: independent, to maximize functional mobility and independence, Not met  Pt Will Demo Standing Balance: 5/5 indep, moves/returns center of grav in all planes > 2", Partly met  Pt Will Ambulate: 101-150 feet, modified independent, Not met(with LRAD)  Pt Will Go Up / Down Stairs: 1 flight, modified independent, to maximize functional mobility and independence, Partly met

## 2018-03-04 NOTE — Plan of Care (Addendum)
Mr. Jhanvi is newly admitted, AOX4, VSS, patient on room air, sinus brady on monitor,  HR was on low 40s, adequate urine output, independent on ADL, has complain of chest pain, prn morphine was administered, no complain of  SOB, dizziness, patient has right sided facial drop, swallowing is intact, no other neuro deficit noticed, skin is intact, will continue monitor.        Problem: Safety  Goal: Patient will be free from injury during hospitalization  Outcome: Progressing     Problem: Pain  Goal: Pain at adequate level as identified by patient  Outcome: Progressing     Problem: Psychosocial and Spiritual Needs  Goal: Demonstrates ability to cope with hospitalization/illness  Outcome: Progressing     Problem: Day of Admission - Stroke  Goal: Core/Quality measure requirements - Admission  Outcome: Progressing     Problem: Every Day - Stroke  Goal: Core/Quality measure requirements - Daily  Outcome: Progressing  Goal: Neurological status is stable or improving  Outcome: Progressing  Goal: Stable vital signs and fluid balance  Outcome: Progressing     Problem: Hemodynamic Status: Cardiac  Goal: Stable vital signs and fluid balance  Outcome: Progressing     Problem: Inadequate Tissue Perfusion  Goal: Adequate tissue perfusion will be maintained  Outcome: Progressing

## 2018-03-04 NOTE — Progress Notes (Signed)
Routine EEG completed.  

## 2018-03-04 NOTE — Progress Notes (Signed)
[facility]  PROGRESS NOTE      Patient: [name] Cook  Date: 03/04/2018   LOS: 0 Days  Admission Date: 03/03/2018   MRN: [id number]  Attending: Gwyndolyn Kaufman  Please contact me on the following Spectralink  [phone]       ASSESSMENT/PLAN     [name] Cook is a 42 y.o. female admitted with facial droop and right-sided weakness    Interval Summary:     Patient Active Hospital Problem List:  Facial droop (05/11/2016)   patient presents with right facial droop as well as right upper and lower extremity weakness tingling and numbness of the right face and right upper extremity  Other symptoms include headache no   slurred speech or vision problems  Continue aspirin Plavix and statins  MRI of brain ordered  Neurology to evaluate    Chest pain  Her  symptoms are atypical  Serial troponins are negative  She was seen by cardiology and had a nuclear perfusion study on the previous admission which was normal  KG reveals voltage criteria for LVH without acute ST-T changes unchanged from previous EKG.    Hypertension.  Permissive hypertension at present time  Resume lisinopril when cleared by neurology    Obesity.  Diet exercise weight loss and lifestyle modification    Seizure disorder by history  Continue with keppra.    Prior history of stroke  Continue statins aspirin and Plavix    Dyslipidemia  Increase dose of lipitor       Anemia  Most likely chronic in view of prior history of anemia dating back several months  We will check iron profile if she is iron deficient will need outpatient follow-up with gynecology/GI  Rule out iron deficiency        Analgesia: Tylenol    Nutrition: Cardiac diet    DVT Prophylaxis: Lovenox       Code Status: Full code    DISPO: To be determined    Family Contact: Patient's significant other       SUBJECTIVE     [name] Cook states he has right upper and lower extremity weakness and facial droop    MEDICATIONS     Current Facility-Administered Medications   Medication Dose Route Frequency    . aspirin  81 mg Oral Daily   . atorvastatin  40 mg Oral QHS   . clopidogrel  75 mg Oral Daily   . enoxaparin  40 mg Subcutaneous Daily   . levETIRAcetam  500 mg Oral Q12H    Or   . levETIRAcetam  500 mg Intravenous Q12H   . LORazepam  1 mg Intravenous Once       PHYSICAL EXAM     Vitals:    03/04/18 0409   BP: 140/65   Pulse: (!) 52   Resp: 18   Temp: 98 F (36.7 C)   SpO2: 98%       Temperature: Temp  Min: 97.5 F (36.4 C)  Max: 98.8 F (37.1 C)  Pulse: Pulse  Min: 52  Max: 70  Respiratory: Resp  Min: 13  Max: 20  Non-Invasive BP: BP  Min: 140/65  Max: 217/106  Pulse Oximetry SpO2  Min: 96 %  Max: 99 %    Intake and Output Summary (Last 24 hours) at Date Time    Intake/Output Summary (Last 24 hours) at 03/04/2018 0931  Last data filed at 03/04/2018 0532  Gross per 24 hour   Intake 360 ml  Output 400 ml   Net -40 ml         GEN APPEARANCE: Obese female no acute distress A&OX3  HEENT: PERLA; EOMI; Conjunctiva Clear  NECK: Supple; No bruits  CVS: RRR, S1, S2; No M/G/R  LUNGS: CTAB; No Wheezes; No Rhonchi: No rales  ABD: Soft; No TTP; + Normoactive BS  EXT: No edema; Pulses 2+ and intact  Skin exam: Warm and dry  NEURO: Right  upper and lower extremity hemiparesis  CAP REFILL: Less than 3 seconds capillary refill  MENTAL STATUS: Awake alert and oriented x3    Exam done by Gwyndolyn Kaufman, MD on 03/04/18 at 9:31 AM      LABS     Recent Labs   Lab 03/04/18  0110 03/03/18  2129   WBC 8.04 7.57   RBC 3.70* 3.79*   Hgb 7.9* 8.4*   Hematocrit 28.1* 29.0*   MCV 75.9* 76.5*   Platelets 162 188       Recent Labs   Lab 03/04/18  0110 03/03/18  2129   Sodium 141 141   Potassium 3.6 4.2   Chloride 109 109   CO2 26 25   BUN 13.0 12.0   Creatinine 0.8 0.9   Glucose 118* 85   Calcium 8.8 8.9       Recent Labs   Lab 03/03/18  2129   ALT 16   AST (SGOT) 31   Bilirubin, Total 0.3   Albumin 3.7   Alkaline Phosphatase 71       Recent Labs   Lab 03/04/18  0407 03/04/18  0110 03/03/18  2129   Troponin I <0.01 <0.01 <0.01       Recent  Labs   Lab 03/03/18  2129   PT INR 1.0   PT 12.6   PTT 29       Microbiology Results     None           RADIOLOGY     Upon my review:    [name] Cook  9:31 AM 03/04/2018
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
## 2018-03-04 NOTE — UM Notes (Signed)
03/03/18 2253  Place (admit) for Observation Services (Adult Observation Admit Panel (AX)) Once    Status:    Admitting Physician Valentino Nose ESE    Diagnosis Facial droop    Estimated Length of Stay < 2 midnights    Tentative Discharge Plan? Home or Self Care    Patient Class Observation    Bed request comments unit 25            Primary Payor: MEDICAID HMO/[employer]      42 y.o. female with a history of seizures, hypertension, hyperlipidemia, TIA, and stroke (2015, 2017) presenting with right facial weakness.  Patient states that she woke up this afternoon with right-sided facial weakness.  She also endorses numbness and tingling in her right face and right upper extremity.  She notes some minor reduction in strength in her right arm and leg.  Symptoms are associated with headache.  Additionally, 2 hours ago she developed chest pain radiating into her left arm.  Initial labs in ER include H&H of 8.4/29.0. CT head reveals no acute process as well as CXR.  Initial VS are 98.8, 67, 99%, 20, 217/106. Urine shows mall LE, 30 protein, small blood, 11-25 WBC.      Patient received iv morphine and po aspirin in the ED.     ASSESSMENT & PLAN  #Right facial weakness  #Right-sided paresthesias  #Hypertension  #Hyperlipidemia  #History of TIA/CVA  -Consult neurology   -Obtain brain MRI/MRA head and neck  -Risk stratification with hemoglobin A1c and lipid panel  -Start aspirin and atorvastatin  -Continue Plavix  -Hold lisinopril  -Permissive hypertension, give hydralazine IV as needed  -Continue to monitor on telemetry  -Neuro checks every 4 hours  -PT/OT consult     #Seizure disorder  -Continue Keppra    # Nutrition  Cardiac diet    # VTE Prophylaxis  Lovenox    # CODE STATUS: Full Code    Anticipated medical stability for discharge: 2 to 3 days    Service status/Reason for ongoing hospitalization: Rule out CVA  Anticipated Discharge Needs: none    Scheduled Meds:  Current  Facility-Administered Medications   Medication Dose Route Frequency   . aspirin  81 mg Oral Daily   . atorvastatin  40 mg Oral QHS   . clopidogrel  75 mg Oral Daily   . enoxaparin  40 mg Subcutaneous Daily   . levETIRAcetam  500 mg Oral Q12H    Or   . levETIRAcetam  500 mg Intravenous Q12H     PRN Meds:.acetaminophen **OR** acetaminophen, hydrALAZINE, morphine, naloxone, nitroglycerin, ondansetron **OR** ondansetron, senna-docusate    Elfredia Nevins, RN, BSN  Utilization Review Case Manager  Case Management  [facility]   [address]  [place], [place]  [address]  T [phone]  Judie Petit [phone] F [phone]

## 2018-03-04 NOTE — Plan of Care (Signed)
Patient has slight right sided facial droop. She was unable to complete MRI due to nose ring. MD notified. Patient resting comfortably in bed. Stand by assist.     Problem: Every Day - Stroke  Goal: Core/Quality measure requirements - Daily  Outcome: Progressing  Flowsheets (Taken 03/04/2018 1609)  Core/Quality measure requirements - Daily : VTE Prevention: Ensure anticoagulant(s) administered and/or anti-embolism stockings/devices documented by end of day 2;Ensure antithrombotic administered or contraindication documented by LIP by end of day 2;Once lipid panel has resulted, check LDL. Contact provider for statin order if LDL > 70 (or ensure contraindication documented by LIP).;Continue stroke education (must include Modifiable Risk Factors, Warning Signs and Symptoms of Stroke, Activation of Emergency Medical System and Follow-up Appointments). Ensure handout has been given and documented.  Goal: Neurological status is stable or improving  Outcome: Progressing  Flowsheets (Taken 03/04/2018 1609)  Neurological status is stable or improving: Monitor/assess/document neurological assessment (Stroke: every 4 hours);Monitor/assess NIH Stroke Scale;Observe for seizure activity and initiate seizure precautions if indicated;Perform CAM Assessment

## 2018-03-05 DIAGNOSIS — R519 Headache, unspecified: Secondary | ICD-10-CM

## 2018-03-05 LAB — BASIC METABOLIC PANEL
Anion Gap: 6 (ref 5.0–15.0)
BUN: 11 mg/dL (ref 7.0–19.0)
CO2: 25 mEq/L (ref 22–29)
Calcium: 8.7 mg/dL (ref 8.5–10.5)
Chloride: 107 mEq/L (ref 100–111)
Creatinine: 0.7 mg/dL (ref 0.6–1.0)
Glucose: 108 mg/dL — ABNORMAL HIGH (ref 70–100)
Potassium: 3.7 mEq/L (ref 3.5–5.1)
Sodium: 138 mEq/L (ref 136–145)

## 2018-03-05 LAB — GFR: EGFR: >60

## 2018-03-05 LAB — CBC
Absolute NRBC: 0 10*3/uL (ref 0.00–0.00)
Hematocrit: 30.1 % — ABNORMAL LOW (ref 34.7–43.7)
Hgb: 8.8 g/dL — ABNORMAL LOW (ref 11.4–14.8)
MCH: 22.1 pg — ABNORMAL LOW (ref 25.1–33.5)
MCHC: 29.2 g/dL — ABNORMAL LOW (ref 31.5–35.8)
MCV: 75.6 fL — ABNORMAL LOW (ref 78.0–96.0)
MPV: 13.2 fL — ABNORMAL HIGH (ref 8.9–12.5)
Nucleated RBC: 0 /100 WBC (ref 0.0–0.0)
Platelets: 195 10*3/uL (ref 142–346)
RBC: 3.98 10*6/uL (ref 3.90–5.10)
RDW: 17 % — ABNORMAL HIGH (ref 11–15)
WBC: 7.59 10*3/uL (ref 3.10–9.50)

## 2018-03-05 LAB — HEMOLYSIS INDEX: Hemolysis Index: 1 (ref 0–18)

## 2018-03-05 LAB — TROPONIN I: Troponin I: <0.01 ng/mL (ref 0.00–0.05)

## 2018-03-05 MED ORDER — MORPHINE SULFATE 4 MG/ML IJ/IV SOLN (WRAP)
4.0000 mg | Status: DC | PRN
Start: 2018-03-05 — End: 2018-03-05
  Administered 2018-03-05 (×2): 4 mg via INTRAVENOUS
  Filled 2018-03-05: qty 1

## 2018-03-05 NOTE — Plan of Care (Addendum)
Problem: Day of Discharge - Stroke  Goal: Able to express understanding of discharge instructions and stroke education  Flowsheets (Taken 03/05/2018 1211)  Able to express understanding of discharge instructions and stroke education: Provide alternative method of communication if needed; Consult/collaborate with Case Management/Social Work; Include patient care companion in decisions related to communication; Patient/patient care companion demonstrates understanding on disease process, treatment plan, medications and discharge plan; Ensure "Discharge instructions: Stroke" appears on discharge paperwork  (After Visit Summary-AVS)       Patient A&Ox4, NSR on the monitor, VSS, Denies any Chest pain, No respiratory distress, Stroke  S/SX and warning signs education  was discussed and Patient verbalized understanding and able to teach back. ASPIRIN and Statin  On discharge education meds uses and side effects patient verbalized understanding.  Discussed discharge orders and follow up appointments and patient verbalized understanding.  Discontinued IV access and  Telemetry. Off the floor via wheelchair with personal beloghings.

## 2018-03-05 NOTE — Discharge Summary (Signed)
[facility]      Patient: [name] Cook  Admission Date: 03/03/2018   DOB: 11-13-1975  Discharge Date: 03/05/2018    MRN: [id number]  Discharge Attending:George Haggart E Moriah Loughry     Referring Physician: Patsy Lager, MD  PCP: Patsy Lager, MD       DISCHARGE SUMMARY     Discharge Information   Admission Diagnosis:   Facial droop    Discharge Diagnosis:   Active Hospital Problems    Diagnosis   . Headache   . Nonspecific abnormal electroencephalogram (EEG)   . History of CVA (cerebrovascular accident)   . Morbid obesity   . Facial droop        Admission Condition: fair  Discharge Condition: stable  Consultants: neuro  Functional Status: baseline  Discharged to: home    Discharge Medications:     Medication List      CONTINUE taking these medications    aspirin 81 MG chewable tablet  Chew 1 tablet (81 mg total) by mouth daily     atorvastatin 40 MG tablet  Commonly known as:  LIPITOR     clopidogrel 75 mg tablet  Commonly known as:  PLAVIX  Take 1 tablet (75 mg total) by mouth daily     KEPPRA PO     lidocaine 5 %  Commonly known as:  LIDODERM  Place 1 patch onto the skin daily. Remove & Discard patch within 12 hours or as directed by MD     lisinopril 40 MG tablet  Commonly known as:  PRINIVIL,ZESTRIL     NITROSTAT 0.4 MG SL tablet  Generic drug:  nitroglycerin                Hospital Course   Presentation History   [name] Cook is a 42 y.o. female with a history of seizures, hypertension, hyperlipidemia, TIA, and stroke (2015, 2017) presenting with right facial weakness.  Patient states that she woke up this afternoon with right-sided facial weakness.  She also endorses numbness and tingling in her right face and right upper extremity.  She notes some minor reduction in strength in her right arm and leg.  Symptoms are associated with headache, but denies speech abnormality, vision change/loss, or confusion.  Additionally, 2 hours ago she developed chest pain radiating into her left arm.  She denies shortness of  breath, abdominal pain, nausea or vomiting..         See HPI for details.    Hospital Course (0 Days)     Facial droop -- question migraine  TIA/stroke ruled out  Patient presents with right facial droop as well as right upper and lower extremity weakness tingling and numbness of the right face and right upper extremity  Other symptoms include headache with no slurred speech or vision problems  Continue aspirin Plavix and statin, home medications  MRI of brain not done as she was unable to remove her nose ring, repeat CT shows no stroke  Neurology has cleared her for discharge     Chest pain  Her  symptoms are atypical  Serial troponins are negative  She was seen by cardiology and had a nuclear perfusion study on the previous admission which was normal  EKG reveals voltage criteria for LVH without acute ST-T changes unchanged from previous EKG.     Hypertension.  Continue home meds    Obesity.  Diet exercise weight loss and lifestyle modification     Seizure disorder by history  Continue with keppra.  Prior history of stroke  Continue statins aspirin and Plavix     Dyslipidemia  Increase dose of lipitor          Procedures/Imaging:   Upon my review: Ct Head Wo Contrast    Result Date: 03/04/2018   Normal noncontrast head CT. Joselyn Glassman, MD 03/04/2018 9:55 PM    Ct Head Wo Contrast    Result Date: 03/03/2018   No acute process seen. No acute interval change is noted compared with prior studies and reports. Charlene Brooke, MD 03/03/2018 9:43 PM    Chest Ap Portable    Result Date: 03/03/2018   1.  No definite radiographic evidence of acute cardiopulmonary disease. Tana Felts, MD 03/03/2018 9:54 PM         Best Practices   Was the patient admitted with either a CHF Exacerbation or Pneumonia? no     Progress Note/Physical Exam at Discharge     Subjective: has a headache, otherwise feels well. No sob chest pain nausea/vomiting cough.    Vitals:    03/04/18 1933 03/04/18 2308 03/05/18 0300 03/05/18 0800   BP:  111/60 150/76 103/48 116/63   Pulse: (!) 58 61 (!) 58 66   Resp: 18 18 18 18    Temp: 97.4 F (36.3 C) 97.4 F (36.3 C) 97.5 F (36.4 C) 97.6 F (36.4 C)   TempSrc:   Oral Oral   SpO2: 97% 96% 96%    Weight:       Height:             General: NAD, AAOx3  HEENT: perrla, eomi, sclera anicteric, OP: Clear, MMM  Neck: supple, FROM, no LAD  Cardiovascular: RRR, no m/r/g  Lungs: CTAB, no w/r/r  Abdomen: soft, +BS, NT/ND, no masses, no g/r  Extremities: no C/C/E  Skin: no rashes or lesions noted  Neuro: CN 2-12 intact; No Focal neurological deficits       Diagnostics     Labs/Studies Pending at Discharge: no    Last Labs   Recent Labs   Lab 03/05/18  0235 03/04/18  0110 03/03/18  2129   WBC 7.59 8.04 7.57   RBC 3.98 3.70* 3.79*   Hgb 8.8* 7.9* 8.4*   Hematocrit 30.1* 28.1* 29.0*   MCV 75.6* 75.9* 76.5*   Platelets 195 162 188       Recent Labs   Lab 03/05/18  0235 03/04/18  0110 03/03/18  2129   Sodium 138 141 141   Potassium 3.7 3.6 4.2   Chloride 107 109 109   CO2 25 26 25    BUN 11.0 13.0 12.0   Creatinine 0.7 0.8 0.9   Glucose 108* 118* 85   Calcium 8.7 8.8 8.9       Microbiology Results     None           Patient Instructions   Discharge Diet: cardiac  Discharge Activity: as tolerated    Follow Up Appointment:  Follow-up Information     Your primary care physician Dr. Carola Frost.           Pcp, Kathreen Cosier, MD .           Allena Earing, FNP Follow up in 2 week(s).    Specialty:  Family Nurse Practitioner  Contact information:  [address] [place]  [address]  [place] [address]  [phone]  Time spent examining patient, discussing with patient/family regarding hospital course, chart review, reconciling medications and discharge planning: 40 minutes.    Janalee Dane Yesly Gerety    10:41 AM 03/05/2018

## 2018-03-05 NOTE — UM Notes (Signed)
Discharge Date: 03/05/2018     Discharge Information  Admission Diagnosis:   Facial droop    Discharge Diagnosis:     Active Hospital Problems   Diagnosis  . Headache  . Nonspecific abnormal electroencephalogram (EEG)  . History of CVA (cerebrovascular accident)  . Morbid obesity  . Facial droop       Admission Condition: fair  Discharge Condition: stable  Consultants: neuro  Functional Status: baseline  Discharged to: home

## 2018-03-05 NOTE — Plan of Care (Signed)
Problem: Side Effects from Pain Analgesia  Goal: Patient will experience minimal side effects of analgesic therapy  Outcome: Progressing  Flowsheets (Taken 03/05/2018 0515)  Patient will experience minimal side effects of analgesic therapy: Monitor/assess patient's respiratory status (RR depth, effort, breath sounds); Evaluate for opioid-induced sedation with appropriate assessment tool (i.e. POSS); Assess for changes in cognitive function     Problem: Every Day - Stroke  Goal: Neurological status is stable or improving  Outcome: Progressing  Flowsheets (Taken 03/05/2018 0515)  Neurological status is stable or improving: Monitor/assess/document neurological assessment (Stroke: every 4 hours); Observe for seizure activity and initiate seizure precautions if indicated; Monitor/assess NIH Stroke Scale     Problem: Hemodynamic Status: Cardiac  Goal: Stable vital signs and fluid balance  Outcome: Progressing   AXOX4. No weakness, full sensation, still having R facial weakness.  Independent.  VSS SR RA, no distress.   Head CT done.   Pt is taking Morphine PRN for chest pain. Morphine order  expired, Pt is allergic to multiple pain medications. Called MD to renew Morphine.

## 2018-03-05 NOTE — Discharge Instr - Activity (Signed)
ACTiVITY AS TOLERATED

## 2018-03-05 NOTE — Discharge Instructions (Signed)
Discharge Instructions for Stroke  You have been diagnosed with or have a high risk for a stroke, ora TIA (transient ischemic attack).During a stroke, blood stops flowing to part of your brain. This can damage areas in the brain that control other parts of the body. Symptoms after a stroke depend on which part of the brain has been affected.  Stroke risk factors  Once you've had a stroke, you're at greater risk for another one. Listed below are some other factors that can increase your risk for a stroke:   High blood pressure   High cholesterol   Cigarette or cigar smoking   Diabetes   Carotid or other artery disease   Atrial fibrillation, atrial flutter,or other heart disease   Not being physically active   Obesity   Certain blood disorders such as sickle cell anemia   Drinking too much alcohol   Abusing street drugs   Race   Gender   Family history of stroke   Diet high in salty, fried, or greasy foods  Changes in daily living  Doingyour regular tasks may be difficult after you've had a stroke, but you can learn new ways to manage your daily activities. In fact, doing daily activities may help you to regain muscle strength. This can also help your affected arm or leg work more normally. Be patient, give yourself time to adjust, and appreciate the progress you make.  Daily activities  You may be at risk of falling. Make changes to your home to help you walk more easily. A therapist will decide if you need an assistive device to walk safely.  You may need to see an occupational therapist or physical therapist to learn new ways of doing things. For example, you may need to make adjustments when bathing or dressing:  Tips for showering or bathing   Test the water temperature with a hand or foot that was not affected by the stroke.   Use grab bars, a shower seat, a hand-held showerhead, and a long-handled brush.  Tips for getting dressed   Dress while sitting, starting with the affected side or  limb.   Wear shirts that pull easily over your head. Wear pants or skirts with elastic waistbands.   Use zippers with loops attached to the pull tabs.  Lifestyle changes   Take your medicines exactly as directed. Don't skip doses.   Begin an exercise program. Ask your provider how to get started. Also ask how much activity you should try to get on a daily or weekly basis. You can benefit from simple activities such as walking or gardening.   Limit how much alcohol you drink. Men should haveno more than 2 alcoholic drinks a day. Women should limit themselves to 1 alcoholic drink per day.   Know your cholesterol level. Follow your provider's recommendations about how to keep cholesterol under control.   If you are a smoker, quit now. Join a stop-smoking program to improve your chances of success. Ask your provider about medicines or other methods to help you quit.   Learn stress management techniques to help you deal with stress in your home and work life.  Diet  Your healthcare provider will give you information on changes you may need to make to your diet, based on your situation. Your provider may recommend that you see a registered dietitian for help with diet changes. Changes may include:   Reducing the amount of fat and cholesterol you eat   Reducing the amount   of salt (sodium) in your diet, especially if you have high blood pressure   Eating more fresh vegetables and fruits   Eating more lean proteins, such as fish, poultry, and beans and peas (legumes)   Eating less red meat and processed meats   Using low-fat dairy products   Limiting vegetable oils and nut oils   Limiting sweets and processed foods such as chips, cookies, and baked goods   Not eating trans fats. These are often found in processed foods. Don't eat any food that has hydrogenated listed in its ingredients.  Follow-up care   Keep your medical appointments. Close follow-up is important to stroke rehabilitation and  recovery.   Some medicines require blood tests to check for progress or problems. Keep follow-up appointments for any blood tests ordered by your providers.  Call 911  Call 911 right awayif you have any of the following symptoms of stroke:   Weakness, tingling, or loss of feeling on one side of your face or body   Sudden double vision or trouble seeing in one or both eyes   Sudden trouble talking or slurred speech   Trouble understanding others   Sudden, severe headache   Dizziness, loss of balance, or a sense of falling   Blackouts or seizures    F.A.S.T. is an easy way to remember the signs of stroke. When you see these signs, you know that you need to call 911 fast.  F.A.S.T. stands for:   F is for face drooping. One side of the face is drooping or numb. When the person smiles, the smile is uneven.   A is for arm weakness. One arm is weak or numb. When the person lifts both arms at the same time, one arm may drift downward.   S is for speech difficulty. You may notice slurred speech or trouble speaking. The person can't repeat a simple sentence correctly when asked.   T is for time to call 911. If someone shows any of these symptoms, even if they go away, call 911 right away. Make note of the time the symptoms first appeared.   [employer] last reviewed this educational content on 01/12/2016   2000-2019 [employer] [address], [place] [address]. All rights reserved. This information is not intended as a substitute for professional medical care. Always follow your healthcare professional's instructions.

## 2018-03-05 NOTE — Discharge Instr - Diet (Signed)
HEART HEALTHY DIET

## 2018-03-05 NOTE — Discharge Instr - AVS First Page (Signed)
Reason for your Hospital Admission:  Headache, facial droop      Instructions for after your discharge:  Continue your usual home medications.  Follow up with neurology.  We do not think this was a stroke or TIA.  Return to the hospital for worsening symptoms.    Take care!  Sincerely  Dr Doyle Askew

## 2018-03-05 NOTE — Progress Notes (Signed)
[facility] Consultation Progress Note    Date Time: 03/05/18 10:23 AM  Patient Name: [facility]  Outpatient Neurologist :    CC: Right facial weakness, paresthesias      Assessment:   Right facial weakness and paresthesias followed by arm and leg weakness in a 42 y.o. female with a PMH significant for HTN, HLD, "seizures", and reported prior CVA's (2015 & 2017). No evidence of any acute intracranial abnormality on CT head x 2. EEG is unremarkable. Current headache could suggest migraine equivalent.              -CT head negative for any acute abnormality              -Unable to complete MRI 2/2 nose ring   -Repeat Head CT 12/23 - Normal   -EEG with slowing, no focal abnormality, no spikes or seizures      Patient Active Problem List   Diagnosis   . Chest pain   . Precordial pain   . Stroke-like symptoms   . Seizures   . Facial droop   . Tension type headache   . History of CVA (cerebrovascular accident)   . Morbid obesity   . Hematemesis   . Blood loss anemia   . HLD (hyperlipidemia)   . Numbness and tingling   . Hemorrhoids   . Right arm weakness   . Nonspecific abnormal electroencephalogram (EEG)       Plan:   -Continue ASA 81 mg daily and Lipitor 80 mg daily. No current indication for additional Plavix at this time.  -Continue Keppra 500 mg BID for now  -Can trial Fioricet PRN for headache  -No need for any further inpatient neurologic workup. Clear for discharge from our standpoint with outpatient follow-up in 3-4 weeks      Neurology Attending Note:  I reviewed this patient's chart and relevant neuro-imaging, laboratory results, and other diagnostic studies. I personally interviewed and examined the patient, and discussed the plan of care with the advanced practice provider. I agree with the findings and plan as outlined above, with any highlights or additions as noted below:    Patient reporting headache this morning but has not yet tried any medications for some. Repeat CT, EEG normal. Clear for  discharge from neurology standpoint, given multiple presentations in the past for same, symptoms of weakness could represent migraine equivalent but suspect more likely functional. Can follow up in clinic as above.    Helyn Numbers, MD  Board Certified, Neurology  [facility]      Interval History/Subjective:   No acute events documented or reported overnight  Complaining of a bifrontal headache this AM  Continues to endorse mild right sided weakness    Denies nausea, paresthesias, or visual changes      Medications:     Current Facility-Administered Medications   Medication Dose Route Frequency   . aspirin  81 mg Oral Daily   . atorvastatin  80 mg Oral QHS   . clopidogrel  75 mg Oral Daily   . enoxaparin  40 mg Subcutaneous Daily   . levETIRAcetam  500 mg Oral Q12H    Or   . levETIRAcetam  500 mg Intravenous Q12H       Review of Systems:   All other systems were reviewed and are otherwise negative except as referenced in the above interval history above.          Physical Exam:   Temp:  [96.8 F (36 C)-97.6 F (  36.4 C)] 97.6 F (36.4 C)  Heart Rate:  [55-66] 66  Resp Rate:  [17-18] 18  BP: (103-150)/(48-89) 116/63    Vital Signs:  Reviewed    General: The patient was well developed and well nourished.  No acute distress. Cooperative with the exam  Extremities: no pedal edema, extremities normal in color    Mental Status: The patient is awake, alert and oriented to person, place, and time.  Affect is normal  Fund of knowledge appropriate  Recent and remote memory are intact   Attention span and concentration appear normal.  Language function is normal. There is no evidence of aphasia in conversational speech.    Cranial nerves:   -CN II: Visual fields full to bedside confrontation   -CN III, IV, VI: Pupils equal, round, and reactive to light; extraocular movements intact; no ptosis                                 -CN V: Facial sensation intact in V1 through V3 distributions   -CN VII: Asymmetric, normal  activation   -CN VIII: Hearing intact to conversational speech   -CN IX, X: Normal phonation   -CN XII: Tongue protrudes midline    Motor: Muscle tone normal without spasticity or flaccidity. No atrophy.  Strength is effort dependent on the right  Strength R / LR / L  Deltoid 5- / 5Hip Flexion5- / 5  Triceps 5- / 5 Hip extension5- / 5  Biceps 5- / 5 Knee flexion5- / 5  Wrist ext5- / 5Knee ext5- / 5  Wrist flexion5- / 5Dorsiflexion5- / 5  FF 5- / 5 Plantar flexion5- / 5    Sensory: Light touch intact.    Reflexes: Deferred    Coordination: FTN intact without obvious dysmetria. No tremors    Gait: Deferred 2/2 patient safety concerns      Labs:     Results     Procedure Component Value Units Date/Time    Troponin I [[id number]] Collected:  03/05/18 0235    Specimen:  Blood Updated:  03/05/18 0418     Troponin I <0.01 ng/mL     Hemolysis index [[id number]] Collected:  03/05/18 0235     Updated:  03/05/18 0415     Hemolysis Index 1    GFR [[id number]] Collected:  03/05/18 0235     Updated:  03/05/18 0415     EGFR >60.0    Basic Metabolic Panel [[id number]]  (Abnormal) Collected:  03/05/18 0235    Specimen:  Blood Updated:  03/05/18 0415     Glucose 108 mg/dL      BUN 32.4 mg/dL      Creatinine 0.7 mg/dL      Calcium 8.7 mg/dL      Sodium 401 mEq/L      Potassium 3.7 mEq/L      Chloride 107 mEq/L      CO2 25 mEq/L      Anion Gap 6.0    CBC without differential [[id number]]  (Abnormal) Collected:  03/05/18 0235    Specimen:  Blood Updated:  03/05/18 0359     WBC 7.59 x10 3/uL      Hgb 8.8 g/dL      Hematocrit 40.3 %      Platelets 195 x10 3/uL      RBC 3.98 x10 6/uL      MCV 75.6 fL      MCH 22.1  pg      MCHC 29.2 g/dL      RDW 17 %      MPV 13.2 fL      Nucleated RBC 0.0 /100 WBC       Absolute NRBC 0.00 x10 3/uL           Rads:   Ct Head Wo Contrast    Result Date: 03/04/2018   Normal noncontrast head CT. Joselyn Glassman, MD 03/04/2018 9:55 PM    Ct Head Wo Contrast    Result Date: 03/03/2018   No acute process seen. No acute interval change is noted compared with prior studies and reports. Charlene Brooke, MD 03/03/2018 9:43 PM    Chest Ap Portable    Result Date: 03/03/2018   1.  No definite radiographic evidence of acute cardiopulmonary disease. Tana Felts, MD 03/03/2018 9:54 PM        Signed by:  Katheren Shams, FNP-C  Nurse Practitioner  [facility]  Daytime: [id number]  Consult requests: [phone]  After 5:00 pm: [phone]

## 2018-09-04 ENCOUNTER — Observation Stay
Admission: EM | Admit: 2018-09-04 | Discharge: 2018-09-06 | Disposition: A | Discharge status: Home / Self Care | Payer: No Typology Code available for payment source | Attending: Internal Medicine | Admitting: Internal Medicine

## 2018-09-04 ENCOUNTER — Emergency Department: Payer: No Typology Code available for payment source

## 2018-09-04 DIAGNOSIS — Z6841 Body Mass Index (BMI) 40.0 and over, adult: Secondary | ICD-10-CM | POA: Diagnosis present

## 2018-09-04 DIAGNOSIS — R109 Unspecified abdominal pain: Secondary | ICD-10-CM | POA: Diagnosis present

## 2018-09-04 DIAGNOSIS — R29810 Facial weakness: Principal | ICD-10-CM | POA: Insufficient documentation

## 2018-09-04 DIAGNOSIS — I639 Cerebral infarction, unspecified: Secondary | ICD-10-CM | POA: Diagnosis present

## 2018-09-04 DIAGNOSIS — Z7902 Long term (current) use of antithrombotics/antiplatelets: Secondary | ICD-10-CM | POA: Insufficient documentation

## 2018-09-04 DIAGNOSIS — R51 Headache: Secondary | ICD-10-CM | POA: Insufficient documentation

## 2018-09-04 DIAGNOSIS — W19XXXA Unspecified fall, initial encounter: Secondary | ICD-10-CM | POA: Insufficient documentation

## 2018-09-04 DIAGNOSIS — Z8673 Personal history of transient ischemic attack (TIA), and cerebral infarction without residual deficits: Secondary | ICD-10-CM

## 2018-09-04 DIAGNOSIS — Z79899 Other long term (current) drug therapy: Secondary | ICD-10-CM | POA: Insufficient documentation

## 2018-09-04 DIAGNOSIS — Z7982 Long term (current) use of aspirin: Secondary | ICD-10-CM | POA: Insufficient documentation

## 2018-09-04 DIAGNOSIS — Z9049 Acquired absence of other specified parts of digestive tract: Secondary | ICD-10-CM | POA: Insufficient documentation

## 2018-09-04 DIAGNOSIS — Z8249 Family history of ischemic heart disease and other diseases of the circulatory system: Secondary | ICD-10-CM | POA: Insufficient documentation

## 2018-09-04 DIAGNOSIS — R531 Weakness: Secondary | ICD-10-CM | POA: Insufficient documentation

## 2018-09-04 DIAGNOSIS — R079 Chest pain, unspecified: Secondary | ICD-10-CM | POA: Insufficient documentation

## 2018-09-04 DIAGNOSIS — R002 Palpitations: Secondary | ICD-10-CM | POA: Insufficient documentation

## 2018-09-04 DIAGNOSIS — I10 Essential (primary) hypertension: Secondary | ICD-10-CM | POA: Insufficient documentation

## 2018-09-04 DIAGNOSIS — R2990 Unspecified symptoms and signs involving the nervous system: Secondary | ICD-10-CM | POA: Diagnosis present

## 2018-09-04 DIAGNOSIS — E876 Hypokalemia: Secondary | ICD-10-CM | POA: Insufficient documentation

## 2018-09-04 DIAGNOSIS — R4781 Slurred speech: Secondary | ICD-10-CM | POA: Insufficient documentation

## 2018-09-04 DIAGNOSIS — R112 Nausea with vomiting, unspecified: Secondary | ICD-10-CM | POA: Insufficient documentation

## 2018-09-04 DIAGNOSIS — K5900 Constipation, unspecified: Secondary | ICD-10-CM | POA: Insufficient documentation

## 2018-09-04 DIAGNOSIS — E7800 Pure hypercholesterolemia, unspecified: Secondary | ICD-10-CM | POA: Insufficient documentation

## 2018-09-04 DIAGNOSIS — E669 Obesity, unspecified: Secondary | ICD-10-CM | POA: Insufficient documentation

## 2018-09-04 DIAGNOSIS — R569 Unspecified convulsions: Secondary | ICD-10-CM | POA: Insufficient documentation

## 2018-09-04 DIAGNOSIS — R202 Paresthesia of skin: Secondary | ICD-10-CM | POA: Insufficient documentation

## 2018-09-04 DIAGNOSIS — E785 Hyperlipidemia, unspecified: Secondary | ICD-10-CM | POA: Insufficient documentation

## 2018-09-04 DIAGNOSIS — R0602 Shortness of breath: Secondary | ICD-10-CM | POA: Insufficient documentation

## 2018-09-04 DIAGNOSIS — R1031 Right lower quadrant pain: Secondary | ICD-10-CM | POA: Insufficient documentation

## 2018-09-04 DIAGNOSIS — G8191 Hemiplegia, unspecified affecting right dominant side: Secondary | ICD-10-CM | POA: Diagnosis present

## 2018-09-04 DIAGNOSIS — I69351 Hemiplegia and hemiparesis following cerebral infarction affecting right dominant side: Secondary | ICD-10-CM | POA: Insufficient documentation

## 2018-09-04 DIAGNOSIS — Z6836 Body mass index (BMI) 36.0-36.9, adult: Secondary | ICD-10-CM | POA: Insufficient documentation

## 2018-09-04 LAB — HEPATIC FUNCTION PANEL
ALT: 15 U/L (ref 0–55)
AST (SGOT): 15 U/L (ref 5–34)
Albumin/Globulin Ratio: 1 (ref 0.9–2.2)
Albumin: 3.7 g/dL (ref 3.5–5.0)
Alkaline Phosphatase: 70 U/L (ref 37–106)
Bilirubin Direct: 0.3 mg/dL (ref 0.0–0.5)
Bilirubin Indirect: 0.3 mg/dL (ref 0.2–1.0)
Bilirubin, Total: 0.6 mg/dL (ref 0.2–1.2)
Globulin: 3.8 g/dL — ABNORMAL HIGH (ref 2.0–3.6)
Protein, Total: 7.5 g/dL (ref 6.0–8.3)

## 2018-09-04 LAB — BASIC METABOLIC PANEL
Anion Gap: 8 (ref 5.0–15.0)
BUN: 10 mg/dL (ref 7.0–19.0)
CO2: 25 mEq/L (ref 22–29)
Calcium: 8.9 mg/dL (ref 8.5–10.5)
Chloride: 107 mEq/L (ref 100–111)
Creatinine: 0.9 mg/dL (ref 0.6–1.0)
Glucose: 93 mg/dL (ref 70–100)
Potassium: 3.4 mEq/L — ABNORMAL LOW (ref 3.5–5.1)
Sodium: 140 mEq/L (ref 136–145)

## 2018-09-04 LAB — CBC AND DIFFERENTIAL
Absolute NRBC: 0 10*3/uL (ref 0.00–0.00)
Basophils Absolute Automated: 0.03 10*3/uL (ref 0.00–0.08)
Basophils Automated: 0.2 %
Eosinophils Absolute Automated: 0.1 10*3/uL (ref 0.00–0.44)
Eosinophils Automated: 0.7 %
Hematocrit: 30.4 % — ABNORMAL LOW (ref 34.7–43.7)
Hgb: 8.8 g/dL — ABNORMAL LOW (ref 11.4–14.8)
Immature Granulocytes Absolute: 0.04 10*3/uL (ref 0.00–0.07)
Immature Granulocytes: 0.3 %
Lymphocytes Absolute Automated: 2.88 10*3/uL (ref 0.42–3.22)
Lymphocytes Automated: 20.1 %
MCH: 22 pg — ABNORMAL LOW (ref 25.1–33.5)
MCHC: 28.9 g/dL — ABNORMAL LOW (ref 31.5–35.8)
MCV: 76 fL — ABNORMAL LOW (ref 78.0–96.0)
MPV: 12.4 fL (ref 8.9–12.5)
Monocytes Absolute Automated: 0.97 10*3/uL — ABNORMAL HIGH (ref 0.21–0.85)
Monocytes: 6.8 %
Neutrophils Absolute: 10.3 10*3/uL — ABNORMAL HIGH (ref 1.10–6.33)
Neutrophils: 71.9 %
Nucleated RBC: 0 /100 WBC (ref 0.0–0.0)
Platelets: 215 10*3/uL (ref 142–346)
RBC: 4 10*6/uL (ref 3.90–5.10)
RDW: 18 % — ABNORMAL HIGH (ref 11–15)
WBC: 14.32 10*3/uL — ABNORMAL HIGH (ref 3.10–9.50)

## 2018-09-04 LAB — URINALYSIS REFLEX TO MICROSCOPIC EXAM - REFLEX TO CULTURE
Bilirubin, UA: NEGATIVE
Blood, UA: NEGATIVE
Glucose, UA: NEGATIVE
Ketones UA: NEGATIVE
Nitrite, UA: NEGATIVE
Protein, UR: 30 — AB
Specific Gravity UA: 1.028 (ref 1.001–1.035)
Urine pH: 6 (ref 5.0–8.0)
Urobilinogen, UA: NEGATIVE mg/dL (ref 0.2–2.0)

## 2018-09-04 LAB — HEMOLYSIS INDEX
Hemolysis Index: 5 (ref 0–18)
Hemolysis Index: 9 (ref 0–18)

## 2018-09-04 LAB — URINE BHCG POC: Urine bHCG POC: NEGATIVE

## 2018-09-04 LAB — HCG QUANTITATIVE: hCG, Quant.: <1.2

## 2018-09-04 LAB — LIPASE: Lipase: 23 U/L (ref 8–78)

## 2018-09-04 LAB — GFR: EGFR: >60

## 2018-09-04 LAB — TROPONIN I: Troponin I: <0.01 ng/mL (ref 0.00–0.05)

## 2018-09-04 MED ORDER — FENTANYL CITRATE (PF) 50 MCG/ML IJ SOLN (WRAP)
50.00 ug | Freq: Once | INTRAMUSCULAR | Status: DC
Start: 2018-09-04 — End: 2018-09-04
  Filled 2018-09-04: qty 2

## 2018-09-04 MED ORDER — FAMOTIDINE 20 MG PO TABS
20.0000 mg | ORAL_TABLET | Freq: Two times a day (BID) | ORAL | Status: DC
Start: 2018-09-04 — End: 2018-09-06
  Administered 2018-09-05 – 2018-09-06 (×3): 20 mg via ORAL
  Filled 2018-09-04 (×3): qty 1

## 2018-09-04 MED ORDER — LISINOPRIL 10 MG PO TABS
40.0000 mg | ORAL_TABLET | Freq: Every day | ORAL | Status: DC
Start: 2018-09-05 — End: 2018-09-06
  Administered 2018-09-05 – 2018-09-06 (×2): 40 mg via ORAL
  Filled 2018-09-04 (×2): qty 4

## 2018-09-04 MED ORDER — NITROGLYCERIN 0.4 MG SL SUBL
0.40 mg | SUBLINGUAL_TABLET | SUBLINGUAL | Status: DC | PRN
Start: 2018-09-04 — End: 2018-09-06

## 2018-09-04 MED ORDER — CEFTRIAXONE SODIUM 1 G IJ SOLR
1.00 g | INTRAMUSCULAR | Status: DC
Start: 2018-09-04 — End: 2018-09-06
  Administered 2018-09-05 (×2): 1 g via INTRAVENOUS
  Filled 2018-09-04 (×2): qty 1000

## 2018-09-04 MED ORDER — SODIUM CHLORIDE 0.9 % IV BOLUS
1000.00 mL | Freq: Once | INTRAVENOUS | Status: AC
Start: 2018-09-04 — End: 2018-09-04
  Administered 2018-09-04: 21:00:00 1000 mL via INTRAVENOUS

## 2018-09-04 MED ORDER — CLOPIDOGREL BISULFATE 75 MG PO TABS
75.0000 mg | ORAL_TABLET | Freq: Every day | ORAL | Status: DC
Start: 2018-09-05 — End: 2018-09-04

## 2018-09-04 MED ORDER — ALUM & MAG HYDROXIDE-SIMETH 200-200-20 MG/5ML PO SUSP
15.00 mL | ORAL | Status: DC | PRN
Start: 2018-09-04 — End: 2018-09-05

## 2018-09-04 MED ORDER — MORPHINE SULFATE 10 MG/ML IJ/IV SOLN (WRAP)
6.0000 mg | Freq: Once | Status: AC
Start: 2018-09-04 — End: 2018-09-04
  Administered 2018-09-04: 22:00:00 6 mg via INTRAVENOUS
  Filled 2018-09-04: qty 1

## 2018-09-04 MED ORDER — NALOXONE HCL 0.4 MG/ML IJ SOLN (WRAP)
0.20 mg | INTRAMUSCULAR | Status: DC | PRN
Start: 2018-09-04 — End: 2018-09-06

## 2018-09-04 MED ORDER — ONDANSETRON HCL 4 MG/2ML IJ SOLN
4.00 mg | Freq: Four times a day (QID) | INTRAMUSCULAR | Status: DC | PRN
Start: 2018-09-04 — End: 2018-09-06

## 2018-09-04 MED ORDER — ATORVASTATIN CALCIUM 40 MG PO TABS
40.0000 mg | ORAL_TABLET | Freq: Every evening | ORAL | Status: DC
Start: 2018-09-04 — End: 2018-09-06
  Administered 2018-09-05: 22:00:00 40 mg via ORAL
  Filled 2018-09-04: qty 1

## 2018-09-04 MED ORDER — LIDOCAINE 5 % EX PTCH
1.00 | MEDICATED_PATCH | Freq: Every day | CUTANEOUS | Status: DC
Start: 2018-09-05 — End: 2018-09-06
  Filled 2018-09-04 (×2): qty 1

## 2018-09-04 MED ORDER — LEVETIRACETAM 500 MG PO TABS
500.0000 mg | ORAL_TABLET | Freq: Two times a day (BID) | ORAL | Status: DC
Start: 2018-09-04 — End: 2018-09-05

## 2018-09-04 MED ORDER — ONDANSETRON 4 MG PO TBDP
4.00 mg | ORAL_TABLET | Freq: Four times a day (QID) | ORAL | Status: DC | PRN
Start: 2018-09-04 — End: 2018-09-06

## 2018-09-04 MED ORDER — ONDANSETRON HCL 4 MG/2ML IJ SOLN
4.00 mg | Freq: Once | INTRAMUSCULAR | Status: AC
Start: 2018-09-04 — End: 2018-09-04
  Administered 2018-09-04: 21:00:00 4 mg via INTRAVENOUS
  Filled 2018-09-04: qty 2

## 2018-09-04 MED ORDER — POTASSIUM CHLORIDE CRYS ER 20 MEQ PO TBCR
20.00 meq | EXTENDED_RELEASE_TABLET | Freq: Once | ORAL | Status: AC
Start: 2018-09-04 — End: 2018-09-04
  Administered 2018-09-04: 22:00:00 20 meq via ORAL
  Filled 2018-09-04: qty 1

## 2018-09-04 MED ORDER — ENOXAPARIN SODIUM 40 MG/0.4ML SC SOLN
40.00 mg | Freq: Every day | SUBCUTANEOUS | Status: DC
Start: 2018-09-05 — End: 2018-09-06
  Administered 2018-09-05 – 2018-09-06 (×2): 40 mg via SUBCUTANEOUS
  Filled 2018-09-04: qty 0.4

## 2018-09-04 MED ORDER — ASPIRIN 81 MG PO CHEW
81.00 mg | CHEWABLE_TABLET | Freq: Every day | ORAL | Status: DC
Start: 2018-09-05 — End: 2018-09-06
  Administered 2018-09-05 – 2018-09-06 (×2): 81 mg via ORAL
  Filled 2018-09-04 (×2): qty 1

## 2018-09-04 MED ORDER — FAMOTIDINE 10 MG/ML IV SOLN (WRAP)
20.00 mg | Freq: Two times a day (BID) | INTRAVENOUS | Status: DC
Start: 2018-09-04 — End: 2018-09-06
  Administered 2018-09-05: 20 mg via INTRAVENOUS
  Filled 2018-09-04 (×3): qty 2

## 2018-09-04 NOTE — ED Notes (Signed)
[facility] ED NURSING NOTE FOR THE RECEIVING INPATIENT NURSE  -Update-      The following information is an update to the previous [facility] ED Nursing Admission Note:    MRI has not become a routine order, patient can have it done in the AM.

## 2018-09-04 NOTE — H&P (Signed)
[facility]      Patient: [name] Cook  Date: 09/04/2018   DOB: 1975-08-22  Admission Date: 09/04/2018   MRN: [id number]  Attending: Marya Amsler         Cc:  1. CP  2. R facial droop  3. HA     History Gathered From: Patient and telemedicine physician.  I also reviewed hospital discharge note dated on 02/2018.    HISTORY AND PHYSICAL     [name] Cook is a 43 y.o. 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      Patient reports that she is having chest pain for past 2 hours, located in middle of the chest, radiating to left arm, nonexertional, constant, stabbing-like chest pain, associated with palpitation, shortness of breath and sweating.  Patient is also complaining of right-sided facial droop started yesterday around 3 PM, associated with headache, right-sided weakness/numbness in arm/leg, right sided facial droop with right-sided facial numbness and double vision in her right eye.  Patient is also complaining of lower abdominal pain, associated with nausea, vomiting and 4 episodes of nonbloody watery bowel movements.  Patient states that she had a fall 2 days ago, unsure how it happened, also unsure if she hit her head or had seizure episode.  Patient denies dizziness, speech/swallow problems or change in urinary habit.    Patient reports that she takes aspirin at home.  Plavix was stopped 3 months ago by her doctor.    Past Medical History:   Diagnosis Date   . Hypercholesteremia    . Hypertension    . Seizures    . Stroke     2015 and 2017, right side   . TIA (transient ischemic attack) 2017       Past Surgical History:   Procedure Laterality Date   . APPENDECTOMY     . CHOLECYSTECTOMY     . EGD, BIOPSY N/A 04/16/2017    Procedure: EGD, BIOPSY;  Surgeon: Pershing Proud, MD;  Location: [facility];  Service: Gastroenterology;  Laterality: N/A;       Prior to Admission medications    Medication Sig Start Date  End Date Taking? Authorizing Provider   aspirin 81 MG chewable tablet Chew 1 tablet (81 mg total) by mouth daily 09/19/17   Doy Hutching D, MD   atorvastatin (LIPITOR) 40 MG tablet Take by mouth.    [provider]   clopidogrel (PLAVIX) 75 mg tablet Take 1 tablet (75 mg total) by mouth daily 09/19/17   Doy Hutching D, MD   LevETIRAcetam (KEPPRA PO) Take 500 mg by mouth 2 (two) times daily.       [provider]   lidocaine (LIDODERM) 5 % Place 1 patch onto the skin daily. Remove & Discard patch within 12 hours or as directed by MD 06/19/15   Hmaid, Abed K, MD   lisinopril (PRINIVIL,ZESTRIL) 40 MG tablet Take 40 mg by mouth daily.    [provider]   nitroglycerin (NITROSTAT) 0.4 MG SL tablet Place under the tongue. 10/24/14   [provider]       Allergies   Allergen Reactions   . Contrast [Iodinated Diagnostic Agents] Anaphylaxis     Patient woke up in ICU after having CT with contrast, last remembers being in CT   . Fioricet [Butalbital-Apap-Caffeine] Hives   . Fentanyl    . Iodine    . Motrin [Ibuprofen] Swelling   . Percocet [  Oxycodone-Acetaminophen]    . Shellfish-Derived Products    . Toradol [Ketorolac Tromethamine]    . Tramadol    . Tylenol [Acetaminophen] Hives       CODE STATUS: Full code.    PRIMARY CARE MD: Patsy Lager, MD    Family History   Problem Relation Age of Onset   . Myocardial Infarction, stroke Father 33   . Deep vein thrombosis Father        Social History     Tobacco Use   . Smoking status: Never Smoker   . Smokeless tobacco: Never Used   Substance Use Topics   . Alcohol use: No   . Drug use: No       REVIEW OF SYSTEMS   Positive for: As in HPI.  Negative for: As in HPI.  All ROS completed and otherwise negative.    PHYSICAL EXAM     Vital Signs (most recent): BP 129/66   Pulse 72   Temp 98.1 F (36.7 C) (Oral)   Resp 20   Ht 1.778 m (5\' 10" )   Wt 114.8 kg (253 lb)   LMP 08/16/2018 (Exact Date)   SpO2 98%   BMI 36.30 kg/m   Constitutional:  NAD. Patient speaks freely in full sentences.   HEENT: NC/AT, PERRL, no scleral icterus or conjunctival pallor, no nasal discharge, MMM.   Neck: trachea midline, supple, no cervical or supraclavicular lymphadenopathy or masses.  Cardiovascular: RRR, normal S1 S2, no murmurs, gallops, palpable thrills, no JVD, no carotid bruit, non-displaced PMI.  Respiratory: Normal rate. No retractions or increased work of breathing. Clear to auscultation bilaterally.  Gastrointestinal: +BS. + obese abdomen. Soft, non-tender, no rebound or guarding, no hepatosplenomegaly.  Genitourinary: + suprapubic but negative costovertebral angle tenderness  Musculoskeletal: No gross abnormality.  No clubbing, edema, or cyanosis. DP and radial pulses 2+ and symmetric.  Skin exam: Normal.  Neurologic: EOMI. R facial droop with decrease sensation in R face. 4.5 tone in RUE. Decrease sensations RUE/RLE.  Pronator drift is negative.  Psychiatric: AAOx3.  Capillary refill: Normal.    Exam done by Marya Amsler, MD on 09/04/18 at 10:46 PM.    LABS & IMAGING     Recent Results (from the past 24 hour(s))   CBC and differential    Collection Time: 09/04/18  8:17 PM   Result Value Ref Range    WBC 14.32 (H) 3.10 - 9.50 x10 3/uL    Hgb 8.8 (L) 11.4 - 14.8 g/dL    Hematocrit 16.1 (L) 34.7 - 43.7 %    Platelets 215 142 - 346 x10 3/uL    RBC 4.00 3.90 - 5.10 x10 6/uL    MCV 76.0 (L) 78.0 - 96.0 fL    MCH 22.0 (L) 25.1 - 33.5 pg    MCHC 28.9 (L) 31.5 - 35.8 g/dL    RDW 18 (H) 11 - 15 %    MPV 12.4 8.9 - 12.5 fL    Neutrophils 71.9 None %    Lymphocytes Automated 20.1 None %    Monocytes 6.8 None %    Eosinophils Automated 0.7 None %    Basophils Automated 0.2 None %    Immature Granulocytes 0.3 None %    Nucleated RBC 0.0 0.0 - 0.0 /100 WBC    Neutrophils Absolute 10.30 (H) 1.10 - 6.33 x10 3/uL    Lymphocytes Absolute Automated 2.88 0.42 - 3.22 x10 3/uL    Monocytes Absolute Automated 0.97 (H) 0.21 - 0.85 x10 3/uL  Eosinophils Absolute Automated 0.10 0.00 -  0.44 x10 3/uL    Basophils Absolute Automated 0.03 0.00 - 0.08 x10 3/uL    Immature Granulocytes Absolute 0.04 0.00 - 0.07 x10 3/uL    Absolute NRBC 0.00 0.00 - 0.00 x10 3/uL   Basic Metabolic Panel    Collection Time: 09/04/18  8:17 PM   Result Value Ref Range    Glucose 93 70 - 100 mg/dL    BUN 27.2 7.0 - 53.6 mg/dL    Creatinine 0.9 0.6 - 1.0 mg/dL    Calcium 8.9 8.5 - 64.4 mg/dL    Sodium 034 742 - 595 mEq/L    Potassium 3.4 (L) 3.5 - 5.1 mEq/L    Chloride 107 100 - 111 mEq/L    CO2 25 22 - 29 mEq/L    Anion Gap 8.0 5.0 - 15.0   Hemolysis index    Collection Time: 09/04/18  8:17 PM   Result Value Ref Range    Hemolysis Index 5 0 - 18   GFR    Collection Time: 09/04/18  8:17 PM   Result Value Ref Range    EGFR >60.0    Lipase    Collection Time: 09/04/18  8:17 PM   Result Value Ref Range    Lipase 23 8 - 78 U/L   Hepatic function panel (LFT)    Collection Time: 09/04/18  8:17 PM   Result Value Ref Range    Bilirubin, Total 0.6 0.2 - 1.2 mg/dL    Bilirubin Direct 0.3 0.0 - 0.5 mg/dL    Bilirubin Indirect 0.3 0.2 - 1.0 mg/dL    AST (SGOT) 15 5 - 34 U/L    ALT 15 0 - 55 U/L    Alkaline Phosphatase 70 37 - 106 U/L    Protein, Total 7.5 6.0 - 8.3 g/dL    Albumin 3.7 3.5 - 5.0 g/dL    Globulin 3.8 (H) 2.0 - 3.6 g/dL    Albumin/Globulin Ratio 1.0 0.9 - 2.2   Troponin I    Collection Time: 09/04/18  8:17 PM   Result Value Ref Range    Troponin I <0.01 0.00 - 0.05 ng/mL   Beta HCG, Quant, Serum    Collection Time: 09/04/18  8:17 PM   Result Value Ref Range    hCG, Quant. <1.2 See below   Hemolysis index    Collection Time: 09/04/18  8:17 PM   Result Value Ref Range    Hemolysis Index 9 0 - 18   Urine BHCG POC    Collection Time: 09/04/18 10:00 PM   Result Value Ref Range    Urine bHCG POC Negative Negative       MICROBIOLOGY:  Blood Culture: NA  Urine Culture: NA  Antibiotics Started: N    IMAGING:  Upon my review:   1. CT head: No acute intracranial abnormality.  2. CXR: No acute findings.  3. CT abd/pelvis: No  nephrolithiasis or hydronephrosis.  4. TVUS: Diffusely heterogeneous myometrium without a discrete fibroid. No sonographic findings of ovarian torsion.    CARDIAC:  EKG Interpretation (upon my review): NSR.  T wave inversion in lead III and aVF, reviewed prior EKG dated on 02/2018-changes.    Markers:  Recent Labs   Lab 09/04/18  2017   Troponin I <0.01       EMERGENCY DEPARTMENT COURSE:  Orders Placed This Encounter   Procedures   . CT Head without Contrast   . US Transvaginal Non OB with  LTD Doppler (Ovarian Torsion)   . CT Abd/Pelvis without Contrast   . XR Chest  AP Portable   . MRI Brain with/without Contrast   . CBC and differential   . Basic Metabolic Panel   . Hemolysis index   . GFR   . Lipase   . Hepatic function panel (LFT)   . Troponin I   . Beta HCG, Quant, Serum   . UA Reflex to Micro - Reflex to Culture   . Hemolysis index   . Urine BHCG POC   . Hemoglobin A1C   . Lipid panel   . Basic Metabolic Panel   . CBC without differential   . ADULT Urinalysis Reflex to Microscopic Exam - Reflex to Culture   . TSH   . Diet NPO effective now   . Urine bHCG POCT   . Pulse Oximetry   . Progressive Mobility Protocol   . Notify physician   . I/O   . Height   . Weight   . Skin assessment   . Place sequential compression device   . Maintain sequential compression device   . Education: Activity   . Education: Disease Process & Condition   . Education: Pain Management   . Education: Falls Risk   . Education: Smoking Cessation   . Telemetry 24 Hour Protocol   . Initiate Stroke Care Plan   . NIHSS   . Nursing Dysphagia Screen   . Smoking/Tobacco Cessation   . Neuro checks   . Education: Stroke   . Reminder   . Full Code   . PT evaluate and treat   . Bedside swallow evaluation and treat   . ECG 12 lead   . Place (admit) for Observation Services   . Place  for Observation Services   . [facility] ED Bed Request (Observation)       ASSESSMENT & PLAN     [name] Cook is a 43 y.o. female with medical history of CVA  (2015//2017), seizure, hypertension, hyperlipidemia and obesity admitted with multiple problems.    Patient Active Hospital Problem List: 09/04/18    1. Stroke like symptoms: Right facial droop, right-sided weakness (RUE/RLE), paresthesia (R face, RUE/RLE). There was question of Migraine in last hospital admission.  2. HA  3. Diplopia: R eye  4. Hx of CVA: 2015 and 2017  5. Fall at home  6. Hx of seizure    -CT head: No acute intracranial abnormality.  -Consider neuro consult in the morning.  -Neuro checks.  -Swallow evaluation.  Keeping n.p.o. overnight due to facial droop/facial numbness to prevent aspiration.   -MRI brain.   -Continue ASA and Lipitor.   -TSH, lipid profile and A1C.  -PT/OT  -Fall precaution  -Continue Keppra with seizure precaution.  Add on Keppra level.    7. CP  8. Palpitation  9. Dyspnea    -Reviewed CXR: No acute findings.  -Reviewed EKG: NSR. T wave inversion in lead III and aVF, reviewed prior EKG dated on 02/2018-changes.  -Reviewed echo report of 09/2017.  -Telemetry monitoring.  Will trend troponin.  -Labs and meds as above.  -On RA with 98% saturation, not in resp distress.    10. GI symptoms: Nausea, vomiting and diarrhea    -CT abd/pelvis: No nephrolithiasis or hydronephrosis.  -TVUS: Diffusely heterogeneous myometrium without a discrete fibroid. No sonographic findings of ovarian torsion.  -Will check UA, stool WBC. With suprapubic pain and leukocytosis, giving Ceftriaxone.  -Zofran IV prn.  -LFTs unremarlkable  11. HTN  12. HLD    -Continue lisinopril and Lipitor.    13. Depression    -Continue Lexapro.    14. Obesity    Lifestyle modification with diet and exercise.    15. Microcytic anemia: Chronic. Hb 8.8 and MCV 76.0    -Repeat CBC.  Will check iron profile and stool occult.    Nutrition  NPO    DVT/VTE Prophylaxis  SCD/Lovenox.     Anticipated medical stability for discharge: 1-2 days.    Service status/Reason for ongoing hospitalization: Observation/Multiple problems.    Anticipated Discharge Needs: To be determined.    Signed,  Marya Amsler    Time Elapsed: 45 min.    This note was generated by the Epic EMR system/ Dragon speech recognition and may contain inherent errors or omissions not intended by the user.

## 2018-09-04 NOTE — Stroke Progress Note (Signed)
Date: 09/04/2018 Time: 10:22 PM    Time Stroke Team notified: yes at 2055     43 y.o. right-handed female     Last known well: 1500 at 09/03/2018  Cardiovascular risk factors: Hypertension (HBP), TIAs, Hyperlipidemia (High Chol), Carotid or other Artery Disease and Obesity  Prior stroke: yes, exact type of CVA unknown  Pre-stroke (baseline) mRS: 0 = No symptoms at all  Patient on anticoagulation: No  Initial NIH Stroke Score: 14  Dysphagia screen was performed and patient is not able  to tolerate water.  CT without contrast was performed, result showed No acute intracranial abnormality  Pt is not a candidate for IV-tPA due to pt last known well is greater than 4.5 hrs.               Stroke education started. Plan of care discussed with patient and team members directly responsible for pt care.  Findings and plan discussed with emergency MD and RN. Will follow as appropriate.               Stroke Team RN Signature: Radford Pax      Modified Rankin Scale:  0-  No symptoms at all   1-  No significant disability despite symptoms; able to carry out all usual duties and activities   2-  Slight disability; unable to carry out all previous activities, but able to look after own affairs without assistance   3-  Moderate disability; requiring some help, but able to walk without assistance   4-  Moderately severe disability; unable to walk without assistance and unable to attend to own bodily needs without assistance    5-  Severe disability; bedridden, incontinent and requiring constant nursing care and attention

## 2018-09-04 NOTE — ED Notes (Signed)
[facility] ED NURSING NOTE FOR THE RECEIVING INPATIENT NURSE   ED NURSE Denyce Robert   [facility] [address]   ED CHARGE RN [phone]   ADMISSION INFORMATION   [name] Cook is a 43 y.o. female admitted with a diagnosis of:    1. Facial droop    2. Weakness    3. Hypokalemia         Isolation: None   Allergies: Contrast [iodinated diagnostic agents]; Fioricet [butalbital-apap-caffeine]; Fentanyl; Iodine; Motrin [ibuprofen]; Percocet [oxycodone-acetaminophen]; Shellfish-derived products; Toradol [ketorolac tromethamine]; Tramadol; and Tylenol [acetaminophen]   Holding Orders confirmed? Yes   Belongings Documented? Yes   Home medications sent to pharmacy confirmed? No   NURSING CARE   Mental Status: alert and oriented   ADL: Independent with all ADLs   Ambulation: no difficulty     Personal Protective Equipment (PPE) (F2)     Gloves    Pertinent Information  and Safety Concerns: CT Neg, MRI-transport requested.      ED Medications: See below, ED ISHAPED (P) Plan for medications administered in the ED   CT / NIH   CT Head ordered on this patient?  Yes   NIH/Dysphagia assessment done prior to admission? Yes    VITAL SIGNS   Time BP Temp Pulse Resp SpO2   2158 129/66 98.1 72 20 98   IV LINES   IV Catheter Size: 20 g  Peripheral IV 09/04/18 Left Antecubital (Active)   Site Assessment Clean;Dry;Intact 09/04/2018  8:23 PM   Line Status Saline Locked;Flushed 09/04/2018  8:23 PM   Dressing Status Clean;Intact;Dry 09/04/2018  8:23 PM   Number of days: 0        LAB RESULTS   Labs Reviewed   CBC AND DIFFERENTIAL - Abnormal; Notable for the following components:       Result Value    WBC 14.32 (*)     Hgb 8.8 (*)     Hematocrit 30.4 (*)     MCV 76.0 (*)     MCH 22.0 (*)     MCHC 28.9 (*)     RDW 18 (*)     Neutrophils Absolute 10.30 (*)     Monocytes Absolute Automated 0.97 (*)     All other components within normal limits   BASIC METABOLIC PANEL - Abnormal; Notable for the following components:    Potassium 3.4 (*)     All other components  within normal limits   HEPATIC FUNCTION PANEL - Abnormal; Notable for the following components:    Globulin 3.8 (*)     All other components within normal limits   HEMOLYSIS INDEX   GFR   LIPASE   TROPONIN I   HCG QUANTITATIVE   HEMOLYSIS INDEX   URINE BHCG POC SOFT   URINALYSIS REFLEX TO MICROSCOPIC EXAM - REFLEX TO CULTURE    Narrative:     Replace urinary catheter prior to obtaining the urine culture  if it has been in place for greater than or equal to 14  days:->N/A No Foley  Indications for U/A Reflex to Micro - Reflex to  Culture:->Suprapubic Pain/Tenderness or Dysuria

## 2018-09-04 NOTE — ED Triage Notes (Signed)
[name] Cook is a 43 y.o. female from home c/o right facial droop, with slurred speech x 1 day.  Pt was seen by PMD, MRI done Dx TIA.  Pt is a/o x4, ambulatory.   Denies headache , nausea or dizziness.    LNW >24hrs, Hx TIA, CVA , HTN   BP (!) 146/94   Pulse 78   Temp 98.1 F (36.7 C) (Oral)   Resp 20   Ht 5\' 10"  (1.778 m)   Wt 114.8 kg   SpO2 96%   BMI 36.30 kg/m

## 2018-09-04 NOTE — ED Provider Notes (Signed)
EMERGENCY DEPARTMENT HISTORY AND PHYSICAL EXAM     None        Date: 09/04/2018  Patient Name: [name] Cook    History of Presenting Illness and Plan     Chief Complaint   Patient presents with   . Facial Droop     right   . Slurred Speech       History Provided By: Patient  Chief Complaint: Facial droop  Duration: 3 PM yesterday  Timing: Acute   Location: Right-sided  Quality: Uncomfortable  Severity: Moderate    Associated symptoms and pertinent negative listed in ROS.      PCP: Pcp, [place]file, MD  SPECIALISTS:    Current Facility-Administered Medications   Medication Dose Route Frequency Provider Last Rate Last Dose   . alum & mag hydroxide-simethicone (MAALOX PLUS) 200-200-20 mg/5 mL suspension 15 mL  15 mL Oral Q4H PRN Karlton Lemon, MD       . aspirin chewable tablet 81 mg  81 mg Oral Daily Karlton Lemon, MD       . atorvastatin (LIPITOR) tablet 40 mg  40 mg Oral QHS Karlton Lemon, MD       . cefTRIAXone (ROCEPHIN) 1 g in sodium chloride 0.9 % 100 mL IVPB mini-bag plus  1 g Intravenous Q24H Marya Amsler, MD 200 mL/hr at 09/05/18 0024 1 g at 09/05/18 0024   . enoxaparin (LOVENOX) syringe 40 mg  40 mg Subcutaneous Daily Karlton Lemon, MD       . famotidine (PEPCID) tablet 20 mg  20 mg Oral Q12H [facility] Karlton Lemon, MD        Or   . famotidine (PEPCID) injection 20 mg  20 mg Intravenous Q12H [facility] Karlton Lemon, MD   20 mg at 09/05/18 0025   . levETIRAcetam (KEPPRA) 500 mg in sodium chloride 0.9 % 100 mL IVPB  500 mg Intravenous Once Marya Amsler, MD 400 mL/hr at 09/05/18 0134 500 mg at 09/05/18 0134   . levETIRAcetam (KEPPRA) tablet 500 mg  500 mg Oral BID Marya Amsler, MD       . lidocaine (LIDODERM) 5 % 1 patch  1 patch Transdermal Daily Karlton Lemon, MD       . lisinopril (ZESTRIL) tablet 40 mg  40 mg Oral Daily Karlton Lemon, MD       . naloxone [facility]) injection 0.2 mg  0.2 mg Intravenous PRN Karlton Lemon, MD       . nitroglycerin (NITROSTAT) SL tablet 0.4 mg  0.4 mg Sublingual Q5 Min PRN Karlton Lemon, MD       . ondansetron (ZOFRAN-ODT) disintegrating tablet 4 mg  4 mg Oral Q6H PRN Karlton Lemon, MD        Or   . ondansetron [facility]) injection 4 mg  4 mg Intravenous Q6H PRN Karlton Lemon, MD           Past History     Past Medical History:  Past Medical History:   Diagnosis Date   . Hypercholesteremia    . Hypertension    . Seizures    . Stroke     2015 and 2017, right side   . TIA (transient ischemic attack) 2017       Past Surgical History:  Past Surgical History:   Procedure Laterality Date   . APPENDECTOMY     . CHOLECYSTECTOMY     . EGD, BIOPSY N/A 04/16/2017  Procedure: EGD, BIOPSY;  Surgeon: Pershing Proud, MD;  Location: [facility];  Service: Gastroenterology;  Laterality: N/A;       Family History:  Family History   Problem Relation Age of Onset   . Myocardial Infarction Father 58   . Deep vein thrombosis Father        Social History:  Social History     Tobacco Use   . Smoking status: Never Smoker   . Smokeless tobacco: Never Used   Substance Use Topics   . Alcohol use: No   . Drug use: No       Allergies:  Allergies   Allergen Reactions   . Contrast [Iodinated Diagnostic Agents] Anaphylaxis     Patient woke up in ICU after having CT with contrast, last remembers being in CT   . Fioricet [Butalbital-Apap-Caffeine] Hives   . Fentanyl    . Iodine    . Motrin [Ibuprofen] Swelling   . Percocet [Oxycodone-Acetaminophen]    . Shellfish-Derived Products    . Toradol [Ketorolac Tromethamine]    . Tramadol    . Tylenol [Acetaminophen] Hives       Review of Systems     Review of Systems   Constitutional: Negative for fever.   HENT: Negative for congestion.    Eyes: Negative for pain.   Respiratory: Negative for shortness of breath.    Cardiovascular: Positive for chest pain.   Gastrointestinal: Positive for abdominal pain. Negative for diarrhea, nausea and vomiting.   Musculoskeletal: Negative for back pain.   Skin: Negative for rash.   Neurological: Positive for weakness. Negative for syncope.    Psychiatric/Behavioral: Negative for agitation.       Physical Exam   BP 123/74   Pulse 64   Temp 98.2 F (36.8 C) (Oral)   Resp 20   Ht 5\' 10"  (1.778 m)   Wt 114.8 kg   LMP 08/16/2018 (Exact Date)   SpO2 95%   BMI 36.30 kg/m     Physical Exam  Vitals signs and nursing note reviewed.   Constitutional:       Appearance: She is well-developed.   HENT:      Head: Normocephalic.   Eyes:      Conjunctiva/sclera: Conjunctivae normal.   Neck:      Musculoskeletal: Neck supple.   Cardiovascular:      Rate and Rhythm: Normal rate and regular rhythm.   Pulmonary:      Effort: Pulmonary effort is normal. No respiratory distress.      Breath sounds: Normal breath sounds. No wheezing.   Abdominal:      Palpations: Abdomen is soft.      Tenderness: There is abdominal tenderness.   Musculoskeletal:         General: No deformity.   Skin:     General: Skin is warm.   Neurological:      Mental Status: She is alert.      Comments: CN II-XII intact aside from R facial droop with sensation differentiation, finger to nose normal, no pronator drift, RUE/RLE 4/5 weakness           Diagnostic Study Results     Labs -     Results     Procedure Component Value Units Date/Time    UA Reflex to Micro - Reflex to Culture [[id number]]  (Abnormal) Collected:  09/04/18 2156     Updated:  09/04/18 2247     Urine Type Urine, Clean Ca  Color, UA Yellow     Clarity, UA Hazy     Specific Gravity UA 1.028     Urine pH 6.0     Leukocyte Esterase, UA Trace     Nitrite, UA Negative     Protein, UR 30     Glucose, UA Negative     Ketones UA Negative     Urobilinogen, UA Negative mg/dL      Bilirubin, UA Negative     Blood, UA Negative     RBC, UA 0 - 2 /hpf      WBC, UA 0 - 5 /hpf      Squamous Epithelial Cells, Urine 6 - 10 /hpf      Urine Mucus Present    Narrative:       Replace urinary catheter prior to obtaining the urine culture  if it has been in place for greater than or equal to 14  days:->N/A No Foley  Indications for U/A Reflex to  Micro - Reflex to  Culture:->Suprapubic Pain/Tenderness or Dysuria    Urine BHCG POC [[id number]] Collected:  09/04/18 2200     Updated:  09/04/18 2207     Urine bHCG POC Negative    Beta HCG, Quant, Serum [[id number]] Collected:  09/04/18 2017     Updated:  09/04/18 2119     hCG, Sharene Butters. <1.2    Troponin I [[id number]] Collected:  09/04/18 2017    Specimen:  Blood Updated:  09/04/18 2119     Troponin I <0.01 ng/mL     Hemolysis index [[id number]] Collected:  09/04/18 2017     Updated:  09/04/18 2113     Hemolysis Index 9    Lipase [[id number]] Collected:  09/04/18 2017    Specimen:  Blood Updated:  09/04/18 2113     Lipase 23 U/L     Hepatic function panel (LFT) [[id number]]  (Abnormal) Collected:  09/04/18 2017    Specimen:  Blood Updated:  09/04/18 2113     Bilirubin, Total 0.6 mg/dL      Bilirubin Direct 0.3 mg/dL      Bilirubin Indirect 0.3 mg/dL      AST (SGOT) 15 U/L      ALT 15 U/L      Alkaline Phosphatase 70 U/L      Protein, Total 7.5 g/dL      Albumin 3.7 g/dL      Globulin 3.8 g/dL      Albumin/Globulin Ratio 1.0    GFR [[id number]] Collected:  09/04/18 2017     Updated:  09/04/18 2053     EGFR >60.0    Basic Metabolic Panel [[id number]]  (Abnormal) Collected:  09/04/18 2017    Specimen:  Blood Updated:  09/04/18 2053     Glucose 93 mg/dL      BUN 75.6 mg/dL      Creatinine 0.9 mg/dL      Calcium 8.9 mg/dL      Sodium 433 mEq/L      Potassium 3.4 mEq/L      Chloride 107 mEq/L      CO2 25 mEq/L      Anion Gap 8.0    Hemolysis index [[id number]] Collected:  09/04/18 2017     Updated:  09/04/18 2053     Hemolysis Index 5    CBC and differential [[id number]]  (Abnormal) Collected:  09/04/18 2017    Specimen:  Blood Updated:  09/04/18 2038     WBC 14.32 x10 3/uL  Hgb 8.8 g/dL      Hematocrit 60.4 %      Platelets 215 x10 3/uL      RBC 4.00 x10 6/uL      MCV 76.0 fL      MCH 22.0 pg      MCHC 28.9 g/dL      RDW 18 %      MPV 12.4 fL      Neutrophils 71.9 %      Lymphocytes Automated 20.1 %      Monocytes 6.8 %       Eosinophils Automated 0.7 %      Basophils Automated 0.2 %      Immature Granulocytes 0.3 %      Nucleated RBC 0.0 /100 WBC      Neutrophils Absolute 10.30 x10 3/uL      Lymphocytes Absolute Automated 2.88 x10 3/uL      Monocytes Absolute Automated 0.97 x10 3/uL      Eosinophils Absolute Automated 0.10 x10 3/uL      Basophils Absolute Automated 0.03 x10 3/uL      Immature Granulocytes Absolute 0.04 x10 3/uL      Absolute NRBC 0.00 x10 3/uL           Radiologic Studies -   Radiology Results (24 Hour)     Procedure Component Value Units Date/Time    US Transvaginal Non OB with LTD Doppler (Ovarian Torsion) [[id number]] Collected:  09/04/18 2213    Order Status:  Completed Updated:  09/04/18 2218    Narrative:        Transabdominal and transvaginal  pelvic ultrasound    INDICATION: pain right lower quadrant    COMPARISON: none available    FINDINGS:   Uterus measures 9 x 6.4 x 5.2 cm.  Diffusely heterogeneous myometrium.  Endometrial stripe is smooth, double wall endometrial thickness is 12 mm    There is no free fluid in the cul-de-sac.      Right ovary measures 2.5 x 2.7 x 3.1 cm     Left ovary measures   4 x 2 x 2.9  cm   There is normal venous waveform to both ovaries. Arterial waveform is  not adequately assessed due to posterior location of the ovaries and  patient body habitus. No secondary signs of ovarian torsion.  No adnexal masses are noted sonographically.    No hydronephrosis in either kidney.      Impression:         Diffusely heterogeneous myometrium without a discrete fibroid. No  sonographic findings of ovarian torsion.    Lorinda Creed, MD   09/04/2018 10:16 PM    CT Abd/Pelvis without Contrast [[id number]] Collected:  09/04/18 2148    Order Status:  Completed Updated:  09/04/18 2200    Narrative:              Indication:    Unspecified abdominal pain    Comparison:   09/19/2017    Technique:   Axial CT images were obtained through the abdomen and  pelvis without contrast.A combination of automatic exposure  control,  adjustment of the mA and/or kV    according to patient size and/or use of iterative reconstruction  technique was   utilized.    Findings:  Lung bases: Lung bases are clear.    Hepatobiliary: Liver is normal in size and contour.  Pancreas within  normal limits.    Spleen: Within normal limits    Adrenal glands: No adrenal  nodule    Kidneys: Symmetric in attenuation No nephrolithiasis or hydronephrosis.    Bowel and mesentery: No bowel obstruction. No free fluid. Aorta: Aorta  is normal in caliber.    No retroperitoneal lymphadenopathy      Pelvis: Bladder is  empty.    No adnexal masses.  No significant free  fluid in the pelvis.    Bones: Bones within normal limits.        Impression:         No nephrolithiasis or hydronephrosis.    Lorinda Creed, MD   09/04/2018 9:58 PM    XR Chest  AP Portable [[id number]] Collected:  09/04/18 2101    Order Status:  Completed Updated:  09/04/18 2103    Narrative:       Clinical History:  Chest pain, nonspecific.    Examination:  XR CHEST AP PORTABLE    Comparison:  03/03/2018    Findings:    No airspace consolidation. No significant pleural effusion or  pneumothorax. The cardiomediastinal silhouette is within normal limits.  The visualized soft tissues and osseous structures are unremarkable.      Impression:           No acute findings.    Carleene Overlie, MD   09/04/2018 9:01 PM    CT Head without Contrast [[id number]] Collected:  09/04/18 1952    Order Status:  Completed Updated:  09/04/18 1955    Narrative:                Indication:    Facial weakness, paralysis or spasm (CN 7) ; facial droop  slurred speech       Comparison:  CT 02/22/2018    Technique:  Axial CT images were obtained through the brain from the  skull base to the vertex without administration of IV contrast.  A  combination of automatic exposure control, adjustment of the mA and/or  kV    according to patient size and/or use of iterative reconstruction  technique was   utilized.    Findings:     Ventricles  and sulci are within normal limits for patient's age.  Gray-white matter differentiation is maintained. There is no  intracranial hemorrhage or extra-axial fluid collection. There is no  mass effect or midline shift.    The included paranasal sinuses and mastoid air cells are clear.    No displaced skull fracture.      Impression:           No acute intracranial abnormality    Lorinda Creed, MD   09/04/2018 7:53 PM      .    Medical Decision Making   I am the first provider for this patient.    I reviewed the vital signs, available nursing notes, past medical history, past surgical history, family history and social history.    Vital Signs-Reviewed the patient's vital signs.     Patient Vitals for the past 12 hrs:   BP Temp Pulse Resp   09/04/18 2324 123/74 98.2 F (36.8 C) 64 -   09/04/18 2158 129/66 - 72 20   09/04/18 2031 140/66 - 78 13   09/04/18 1939 (!) 146/94 98.1 F (36.7 C) 78 20       Pulse Oximetry Analysis - Normal     EKG:  Interpreted by the EP.   Time Interpreted: 2015   Rate: 80   Interpretation: NSR, NAD, no ST abnormality, TWI of III, avf, indeterminate EKG   Comparison: unchanged  form 12/19    Procedures:    Old Medical Records:    ED Course:      ED Course as of Sep 05 134   Wed Sep 04, 2018   2215 Spoke to Dr. Clent Ridges, Sound. Accepts pt to IMCU, obs.    [AL]      ED Course User Index  [AL] Marcille Blanco   Dr. Always recommends MRI, will consult.       Provider Note: 43 year old female with past medical history of hyperlipidemia, hypertension, CVA with right-sided weakness presents with right-sided weakness since 3 PM and slurred speech and right facial droop pain, patient endorses drooping and the weakness is new has stopped prior strokes symptoms have resolved, endorses slurred speech is improving since yesterday, endorses being seen in outside hospital with negative MRI and being discharged since yesterday, no fever cough, also endorses some chest pain and right lower quadrant abdominal pain.   Vital signs stable, EKG NSR.  Neuro exam as above NIH score of 14.  Differential is psychogenic, CVA, infection, ACS.  Plan is for lab work, imaging for likely admission.     Dr. Suzy Bouchard is the primary emergency doctor of record.        Critical Care Time:    For Hospitalized Patients:    1. Hospitalization Decision Time:  The decision to admit this patient was made by the emergency provider at 1018PM[time] on 09/04/2018     2. Aspirin: Aspirin was given    3. Core Measures    Diagnosis     Clinical Impression:   1. Facial droop    2. Weakness    3. Hypokalemia        Treatment Plan:   ED Disposition     ED Disposition Condition Date/Time Comment    Observation  Wed Sep 04, 2018 10:18 PM Admitting Physician: Marya Amsler [[id number]]   Diagnosis: CVA (cerebral vascular accident) [604540]   Estimated Length of Stay: < 2 midnights   Tentative Discharge Plan?: Home or Self Care [1]   Patient Class: Observation [104]                This note was generated by the Epic EMR system/ Dragon speech recognition and may contain inherent errors or omissions not intended by the user. Grammatical errors, random word insertions, deletions and pronoun errors  are occasional consequences of this technology due to software limitations. Not all errors are caught or corrected. If there are questions or concerns about the content of this note or information contained within the body of this dictation they should be addressed directly with the author for clarification.  _______________________________       Suzy Bouchard, MD  09/05/18 [phone]

## 2018-09-05 ENCOUNTER — Observation Stay: Payer: No Typology Code available for payment source

## 2018-09-05 DIAGNOSIS — R109 Unspecified abdominal pain: Secondary | ICD-10-CM | POA: Diagnosis present

## 2018-09-05 LAB — CBC
Absolute NRBC: 0 10*3/uL (ref 0.00–0.00)
Hematocrit: 30.1 % — ABNORMAL LOW (ref 34.7–43.7)
Hgb: 8.6 g/dL — ABNORMAL LOW (ref 11.4–14.8)
MCH: 21.9 pg — ABNORMAL LOW (ref 25.1–33.5)
MCHC: 28.6 g/dL — ABNORMAL LOW (ref 31.5–35.8)
MCV: 76.8 fL — ABNORMAL LOW (ref 78.0–96.0)
MPV: 12.6 fL — ABNORMAL HIGH (ref 8.9–12.5)
Nucleated RBC: 0 /100 WBC (ref 0.0–0.0)
Platelets: 199 10*3/uL (ref 142–346)
RBC: 3.92 10*6/uL (ref 3.90–5.10)
RDW: 18 % — ABNORMAL HIGH (ref 11–15)
WBC: 11.77 10*3/uL — ABNORMAL HIGH (ref 3.10–9.50)

## 2018-09-05 LAB — ECG 12-LEAD
Atrial Rate: 80 {beats}/min
P Axis: 27 degrees
P-R Interval: 138 ms
Q-T Interval: 390 ms
QRS Duration: 90 ms
QTC Calculation (Bezet): 449 ms
R Axis: 0 degrees
T Axis: -7 degrees
Ventricular Rate: 80 {beats}/min

## 2018-09-05 LAB — BASIC METABOLIC PANEL
Anion Gap: 9 (ref 5.0–15.0)
BUN: 9 mg/dL (ref 7.0–19.0)
CO2: 23 mEq/L (ref 22–29)
Calcium: 8.3 mg/dL — ABNORMAL LOW (ref 8.5–10.5)
Chloride: 109 mEq/L (ref 100–111)
Creatinine: 0.9 mg/dL (ref 0.6–1.0)
Glucose: 130 mg/dL — ABNORMAL HIGH (ref 70–100)
Potassium: 3.3 mEq/L — ABNORMAL LOW (ref 3.5–5.1)
Sodium: 141 mEq/L (ref 136–145)

## 2018-09-05 LAB — HEMOGLOBIN A1C
Average Estimated Glucose: 102.5 mg/dL
Hemoglobin A1C: 5.2 % (ref 4.6–5.9)

## 2018-09-05 LAB — LIPID PANEL
Cholesterol / HDL Ratio: 4.5
Cholesterol: 150 mg/dL (ref 0–199)
HDL: 33 mg/dL — ABNORMAL LOW (ref 40–9999)
LDL Calculated: 91 mg/dL (ref 0–99)
Triglycerides: 130 mg/dL (ref 34–149)
VLDL Calculated: 26 mg/dL (ref 10–40)

## 2018-09-05 LAB — TROPONIN I: Troponin I: <0.01 ng/mL (ref 0.00–0.05)

## 2018-09-05 LAB — TSH: TSH: 1.99 u[IU]/mL (ref 0.35–4.94)

## 2018-09-05 LAB — HEMOLYSIS INDEX
Hemolysis Index: 10 (ref 0–18)
Hemolysis Index: 9 (ref 0–18)

## 2018-09-05 LAB — GFR: EGFR: >60

## 2018-09-05 MED ORDER — MORPHINE SULFATE 15 MG PO TABS
15.0000 mg | ORAL_TABLET | ORAL | Status: DC | PRN
Start: 2018-09-05 — End: 2018-09-06
  Administered 2018-09-05: 15 mg via ORAL
  Filled 2018-09-05: qty 1

## 2018-09-05 MED ORDER — LORAZEPAM 2 MG/ML IJ SOLN
1.00 mg | Freq: Once | INTRAMUSCULAR | Status: AC | PRN
Start: 2018-09-05 — End: 2018-09-05
  Administered 2018-09-05: 17:00:00 1 mg via INTRAVENOUS
  Filled 2018-09-05: qty 1

## 2018-09-05 MED ORDER — SODIUM CHLORIDE 0.9 % IV SOLN
500.00 mg | Freq: Once | INTRAVENOUS | Status: AC
Start: 2018-09-05 — End: 2018-09-05
  Administered 2018-09-05: 02:00:00 500 mg via INTRAVENOUS
  Filled 2018-09-05: qty 5

## 2018-09-05 MED ORDER — POTASSIUM CHLORIDE 10 MEQ/100ML IV SOLN (WRAP)
10.00 meq | Freq: Once | INTRAVENOUS | Status: AC
Start: 2018-09-05 — End: 2018-09-05
  Administered 2018-09-05: 07:00:00 10 meq via INTRAVENOUS
  Filled 2018-09-05: qty 100

## 2018-09-05 MED ORDER — POLYETHYLENE GLYCOL 3350 17 G PO PACK
17.00 g | PACK | Freq: Every day | ORAL | Status: DC
Start: 2018-09-05 — End: 2018-09-06

## 2018-09-05 MED ORDER — DICYCLOMINE HCL 10 MG PO CAPS
10.00 mg | ORAL_CAPSULE | Freq: Four times a day (QID) | ORAL | Status: DC | PRN
Start: 2018-09-05 — End: 2018-09-06
  Administered 2018-09-05: 16:00:00 10 mg via ORAL
  Filled 2018-09-05: qty 1

## 2018-09-05 MED ORDER — LEVETIRACETAM 500 MG PO TABS
500.0000 mg | ORAL_TABLET | Freq: Two times a day (BID) | ORAL | Status: DC
Start: 2018-09-05 — End: 2018-09-06
  Administered 2018-09-05 – 2018-09-06 (×2): 500 mg via ORAL
  Filled 2018-09-05 (×2): qty 1

## 2018-09-05 MED ORDER — ALUM & MAG HYDROXIDE-SIMETH 200-200-20 MG/5ML PO SUSP
15.00 mL | ORAL | Status: DC
Start: 2018-09-05 — End: 2018-09-06
  Administered 2018-09-05 – 2018-09-06 (×5): 15 mL via ORAL
  Filled 2018-09-05 (×6): qty 30

## 2018-09-05 MED ORDER — MORPHINE SULFATE 2 MG/ML IJ/IV SOLN (WRAP)
1.0000 mg | Status: DC | PRN
Start: 2018-09-05 — End: 2018-09-06
  Administered 2018-09-06 (×3): 1 mg via INTRAVENOUS
  Filled 2018-09-05 (×3): qty 1

## 2018-09-05 MED ORDER — SENNOSIDES-DOCUSATE SODIUM 8.6-50 MG PO TABS
2.0000 | ORAL_TABLET | Freq: Every evening | ORAL | Status: DC
Start: 2018-09-05 — End: 2018-09-06
  Filled 2018-09-05: qty 2

## 2018-09-05 NOTE — Consults (Signed)
NEUROLOGY CONSULTATION    Date Time: 09/05/18 10:12 AM  Patient Name: [name] Cook  Attending Physician: Teodora Medici, MD      Assessment & Plan:   R arm/leg weakness and ? L facial weakness (pt states it is the R side though).  Given Hx CVA, recurrence of symptoms, negative recent work up - could be malingering - but will need to pursue MRI brain again as she says the symptoms worsened since her discharge.     MRI brain   ASA for now   Neuro checks Q4 hours   Further work up depending on results obtained.    History of Present Illness:   43 yo female with HLD, HTN, seizures, Hx CVA who is presenting with R sided weakness, headaches, and facial droop.  Pt feels the R side of her face is weak.  There is also slurred speech - now improved.  Pt reports prior MRI evaluation at an outside hospital and was in fact discharged yesterday with negative findings.  Pt feels symptoms again worsened and now comes into our hospital.     Past Medical History:     Past Medical History:   Diagnosis Date   . Hypercholesteremia    . Hypertension    . Seizures    . Stroke     2015 and 2017, right side   . TIA (transient ischemic attack) 2017       Meds:      Scheduled Meds: PRN Meds:    aspirin, 81 mg, Oral, Daily  atorvastatin, 40 mg, Oral, QHS  cefTRIAXone, 1 g, Intravenous, Q24H  enoxaparin, 40 mg, Subcutaneous, Daily  famotidine, 20 mg, Oral, Q12H SCH    Or  famotidine, 20 mg, Intravenous, Q12H SCH  levETIRAcetam, 500 mg, Oral, BID  lidocaine, 1 patch, Transdermal, Daily  lisinopril, 40 mg, Oral, Daily        Continuous Infusions:   alum & mag hydroxide-simethicone, 15 mL, Q4H PRN  naloxone, 0.2 mg, PRN  nitroglycerin, 0.4 mg, Q5 Min PRN  ondansetron, 4 mg, Q6H PRN    Or  ondansetron, 4 mg, Q6H PRN          I personally reviewed all of the medications.  Medication list generated using all available resources.    Allergies   Allergen Reactions   . Contrast [Iodinated Diagnostic Agents] Anaphylaxis     Patient woke up in ICU  after having CT with contrast, last remembers being in CT   . Fioricet [Butalbital-Apap-Caffeine] Hives   . Fentanyl    . Iodine    . Motrin [Ibuprofen] Swelling   . Percocet [Oxycodone-Acetaminophen]    . Shellfish-Derived Products    . Toradol [Ketorolac Tromethamine]    . Tramadol    . Tylenol [Acetaminophen] Hives       Social & Family History:     Social History     Socioeconomic History   . Marital status: Single     Spouse name: Not on file   . Number of children: Not on file   . Years of education: Not on file   . Highest education level: Not on file   Occupational History   . Not on file   Social Needs   . Financial resource strain: Not on file   . Food insecurity     Worry: Not on file     Inability: Not on file   . Transportation needs     Medical: Not on file  Non-medical: Not on file   Tobacco Use   . Smoking status: Never Smoker   . Smokeless tobacco: Never Used   Substance and Sexual Activity   . Alcohol use: No   . Drug use: No   . Sexual activity: Not on file   Lifestyle   . Physical activity     Days per week: Not on file     Minutes per session: Not on file   . Stress: Not on file   Relationships   . Social Wellsite geologist on phone: Not on file     Gets together: Not on file     Attends religious service: Not on file     Active member of club or organization: Not on file     Attends meetings of clubs or organizations: Not on file     Relationship status: Not on file   . Intimate partner violence     Fear of current or ex partner: Not on file     Emotionally abused: Not on file     Physically abused: Not on file     Forced sexual activity: Not on file   Other Topics Concern   . Not on file   Social History Narrative   . Not on file       Family History   Problem Relation Age of Onset   . Myocardial Infarction Father 18   . Deep vein thrombosis Father        Review of Systems:   No eye, ear nose, throat problems; no coughing or wheezing or shortness of breath, No chest pain or orthopnea, no  abdominal pain, nausea or vomiting, No pain in the body or extremities, no psychiatric, neurological, endocrine, hematological or cardiac complaints except as noted above.     Physical Exam:   Blood pressure 117/77, pulse (!) 56, temperature 98.5 F (36.9 C), temperature source Oral, resp. rate 18, height 1.778 m (5\' 10" ), weight 114.8 kg (253 lb), last menstrual period 08/16/2018, SpO2 100 %.    Pt is alert and mostly appropriate though says little.  L facial weakness - sparring the forehead  R arm and R leg drift on exam  Follows commands well    Labs:     Recent Labs   Lab 09/05/18  0348 09/04/18  2017   Glucose 130* 93   BUN 9.0 10.0   Creatinine 0.9 0.9   Calcium 8.3* 8.9   Sodium 141 140   Potassium 3.3* 3.4*   Chloride 109 107   CO2 23 25   Albumin  --  3.7   AST (SGOT)  --  15   ALT  --  15   Bilirubin, Total  --  0.6   Alkaline Phosphatase  --  70     Recent Labs   Lab 09/05/18  0348 09/04/18  2017   WBC 11.77* 14.32*   Hgb 8.6* 8.8*   Hematocrit 30.1* 30.4*   MCV 76.8* 76.0*   MCH 21.9* 22.0*   MCHC 28.6* 28.9*   Platelets 199 215         No results for input(s): PTT, PT, INR in the last 72 hours.       Radiology Results (24 Hour)     Procedure Component Value Units Date/Time    US Transvaginal Non OB with LTD Doppler (Ovarian Torsion) [[id number]] Collected:  09/04/18 2213    Order Status:  Completed Updated:  09/04/18 2218  Narrative:        Transabdominal and transvaginal  pelvic ultrasound    INDICATION: pain right lower quadrant    COMPARISON: none available    FINDINGS:   Uterus measures 9 x 6.4 x 5.2 cm.  Diffusely heterogeneous myometrium.  Endometrial stripe is smooth, double wall endometrial thickness is 12 mm    There is no free fluid in the cul-de-sac.      Right ovary measures 2.5 x 2.7 x 3.1 cm     Left ovary measures   4 x 2 x 2.9  cm   There is normal venous waveform to both ovaries. Arterial waveform is  not adequately assessed due to posterior location of the ovaries and  patient body  habitus. No secondary signs of ovarian torsion.  No adnexal masses are noted sonographically.    No hydronephrosis in either kidney.      Impression:         Diffusely heterogeneous myometrium without a discrete fibroid. No  sonographic findings of ovarian torsion.    Lorinda Creed, MD   09/04/2018 10:16 PM    CT Abd/Pelvis without Contrast [[id number]] Collected:  09/04/18 2148    Order Status:  Completed Updated:  09/04/18 2200    Narrative:              Indication:    Unspecified abdominal pain    Comparison:   09/19/2017    Technique:   Axial CT images were obtained through the abdomen and  pelvis without contrast.A combination of automatic exposure control,  adjustment of the mA and/or kV    according to patient size and/or use of iterative reconstruction  technique was   utilized.    Findings:  Lung bases: Lung bases are clear.    Hepatobiliary: Liver is normal in size and contour.  Pancreas within  normal limits.    Spleen: Within normal limits    Adrenal glands: No adrenal nodule    Kidneys: Symmetric in attenuation No nephrolithiasis or hydronephrosis.    Bowel and mesentery: No bowel obstruction. No free fluid. Aorta: Aorta  is normal in caliber.    No retroperitoneal lymphadenopathy      Pelvis: Bladder is  empty.    No adnexal masses.  No significant free  fluid in the pelvis.    Bones: Bones within normal limits.        Impression:         No nephrolithiasis or hydronephrosis.    Lorinda Creed, MD   09/04/2018 9:58 PM    XR Chest  AP Portable [[id number]] Collected:  09/04/18 2101    Order Status:  Completed Updated:  09/04/18 2103    Narrative:       Clinical History:  Chest pain, nonspecific.    Examination:  XR CHEST AP PORTABLE    Comparison:  03/03/2018    Findings:    No airspace consolidation. No significant pleural effusion or  pneumothorax. The cardiomediastinal silhouette is within normal limits.  The visualized soft tissues and osseous structures are unremarkable.      Impression:           No acute  findings.    Carleene Overlie, MD   09/04/2018 9:01 PM    CT Head without Contrast [[id number]] Collected:  09/04/18 1952    Order Status:  Completed Updated:  09/04/18 1955    Narrative:                Indication:  Facial weakness, paralysis or spasm (CN 7) ; facial droop  slurred speech       Comparison:  CT 02/22/2018    Technique:  Axial CT images were obtained through the brain from the  skull base to the vertex without administration of IV contrast.  A  combination of automatic exposure control, adjustment of the mA and/or  kV    according to patient size and/or use of iterative reconstruction  technique was   utilized.    Findings:     Ventricles and sulci are within normal limits for patient's age.  Gray-white matter differentiation is maintained. There is no  intracranial hemorrhage or extra-axial fluid collection. There is no  mass effect or midline shift.    The included paranasal sinuses and mastoid air cells are clear.    No displaced skull fracture.      Impression:           No acute intracranial abnormality    Lorinda Creed, MD   09/04/2018 7:53 PM           All recent brain and spine imaging (MRI, CT) results reviewed.    Chart reviewed    Code status confirmed    Case discussed with: patient    40 minutes; >50% time spent in counseling or coordination of care    Signed by: Ardelle Anton, MD  Spectralink: [phone]       Answering Service: [phone]

## 2018-09-05 NOTE — SLP Eval Note (Signed)
[facility]  Speech and Language Therapy Bedside Swallow Evaluation     Patient:  [name] Cook MRN#:  [id number]  Unit:  [facility] Room/Bed:  Y3016/W1093-A    Time of Treatment:   Time Calculation  SLP Received On: 09/05/18  Start Time: 0800  Stop Time: 0820  Time Calculation (min): 20 min    Consult received for [name] Cook for SLP Bedside Swallow Evaluation and Treatment.    Medical Diagnosis: Hypokalemia [E87.6]  Weakness [R53.1]  Facial droop [R29.810]  Facial droop [R29.810]  CVA (cerebral vascular accident) [I63.9]    History of Present Illness: [name] Cook is a 43 y.o. female admitted on 09/04/2018 with   medical history of CVA (2015//2017), seizure, hypertension, hyperlipidemia and obesity who presented with multiple symptoms.      Patient reports that she is having chest pain for past 2 hours, located in middle of the chest, radiating to left arm, nonexertional, constant, stabbing-like chest pain, associated with palpitation, shortness of breath and sweating.  Patient is also complaining of right-sided facial droop started yesterday around 3 PM, associated with headache, right-sided weakness/numbness in arm/leg, right sided facial droop with right-sided facial numbness and double vision in her right eye.  Patient is also complaining of lower abdominal pain, associated with nausea, vomiting and 4 episodes of nonbloody watery bowel movements.  Patient states that she had a fall 2 days ago, unsure how it happened, also unsure if she hit her head or had seizure episode.  Patient denies dizziness, speech/swallow problems or change in urinary habit.    Patient reports that she takes aspirin at home.  Plavix was stopped 3 months ago by her doctor.  CT head w/o contrast 6/24  Findings:     Ventricles and sulci are within normal limits for patient's age.  Gray-white matter differentiation is maintained. There is no  intracranial hemorrhage or extra-axial fluid collection. There is  no  mass effect or midline shift.    The included paranasal sinuses and mastoid air cells are clear.    No displaced skull fracture.    IMPRESSION:       No acute intracranial abnormality    Patient Active Problem List   Diagnosis   . Chest pain   . Precordial pain   . Stroke-like symptoms   . Seizures   . Facial droop   . Tension type headache   . History of CVA (cerebrovascular accident)   . Morbid obesity   . Hematemesis   . Blood loss anemia   . HLD (hyperlipidemia)   . Numbness and tingling   . Hemorrhoids   . Right arm weakness   . Nonspecific abnormal electroencephalogram (EEG)   . Headache   . CVA (cerebral vascular accident)        Past Medical/Surgical History:  Past Medical History:   Diagnosis Date   . Hypercholesteremia    . Hypertension    . Seizures    . Stroke     2015 and 2017, right side   . TIA (transient ischemic attack) 2017      Past Surgical History:   Procedure Laterality Date   . APPENDECTOMY     . CHOLECYSTECTOMY     . EGD, BIOPSY N/A 04/16/2017    Procedure: EGD, BIOPSY;  Surgeon: Pershing Proud, MD;  Location: [facility];  Service: Gastroenterology;  Laterality: N/A;         History/Current Status:  Current Status  Behavior/Mental Status: Awake/alert, Able  to follow directions, Cooperative    Subjective: Patient is agreeable to participation in the therapy session. Nursing clears patient for therapy. Patient's medical condition is appropriate for Speech therapy intervention at this time.    Objective:  Observation of Patient/Vital Signs:  Patient is in bed with telemetry in place.    Oral Motor Skills:  Engineer, maintenance (IT) Skills: exceptions to [facility]  Oral Motor Impairments: coordination, strength    Deglutition Skills:  Deglutition Skills  Position: upright 90 degrees  Food(s) Tested: thin liquid, puree, soft solid  Oral Stage: bolus formation/control reduced, slow but effective, suspect AP propulsion reduced, chewing reduced, slow but effective       Oral Stage  Residuals: Scattered  Pharyngeal Stage: suspect delayed response    Assessment:     Pt is alert/ verbal , reported stomach flank pain on the right side. Oral motor exam revealed left facial droop. Pt presents with mild oral pharyngeal dysphagia characterized by mild delay in A/P transit times and mildly delayed pharyngeal swallow trigger. Slow mastication with unilateral chewing on the right side at times. Pt required liquid assist for efficient oral clearance of dry solid .  Once pharyngeal swallow is elicited, there was adequate Hyolaryngeal excursion per digital palpation.  There was no overt s/s aspiration or penetration with pureed x 4 oz ,  solid x10 and thin liquid x 4 fl oz via straw. Pt educated regarding the need to adhere to safe swallowing precautions and alt solid with liquid . Slow mastication and be cautious not to bite the left cheek.       Goals:     Patient will adhere to swallow safety precautions 100% of the time during meals.  Pt will demonstrate adequate oral phase in order to safely manage  Regular and thin liquids for 3 meals /day without overt s/s aspiration and use of min cues for comp swallowing strategies.   Pt will use strategies to improve swallow function to tolerate the least restrictive diet without s/s aspiration or penetration provided with supervision.      Plan/Recommendations:           Diet Solids Recommendation: regular  Diet Liquids Recommendations: thin consistency  Precautions/Compensations: Awake/alert, Upright 90 degrees for all oral intake, 45 degrees upright after meals, Alternate solids and liquids, Swallow multiple times per bite/sip  Recommendation Discussed With: : Patient, Nurse  SLP Frequency Recommended: 1-2x/wk  Administration of Medications: PO  Aspiration Precautions posted at bedside: d/ w pt and RN    09/05/2018  [name] MS [facility] SLP   09/05/2018 11:43 AM  [phone]

## 2018-09-05 NOTE — Progress Notes (Addendum)
Situation 43 yo female with HLD, HTN, seizures, Hx CVA who is presenting with R sided weakness, headaches, and facial droop.   Background Pt from home, lived w/spouse. Has insurance and PCP, Dr. Rutherford L[place], [facility] .    Assessment PTA independent. No DME.      Recommendation DCP home via spouse transport.           09/05/18 1217   Patient Type   Within 30 Days of Previous Admission? No   Healthcare Decisions   Interviewed: Patient   Orientation/Decision Making Abilities of Patient Alert and Oriented x3, able to make decisions   Healthcare Agent Appointed No   Prior to admission   Prior level of function Independent with ADLs;Ambulates independently   Type of Residence Private residence   Home Layout One level   Have running water, electricity, heat, etc? Yes   Living Arrangements Spouse/significant other   How do you get to your MD appointments? ind   How do you get your groceries? ind   Who fixes your meals? ind   Who does your laundry? ind   Who picks up your prescriptions? ind   Dressing Independent   Grooming Independent   Feeding Independent   Bathing Independent   Toileting Independent   Adult Protective Services (APS) involved? No   Discharge Planning   Support Systems Spouse/significant other   Patient expects to be discharged to: home   Anticipated [place] plan discussed with: Same as interviewed   Mode of transportation: Private car (family member)   Does the patient have perscription coverage? Yes   Consults/Providers   Correct PCP listed in Epic? No (comment)   PCP   PCP on file was verified as the current PCP? Provider not on file   PCP - last name Dr. Unknown Foley    Important Message from Medicare Notice   Patient received 1st IMM Letter? n/a     [place] Calix, RN  Case Manager  [phone]

## 2018-09-05 NOTE — Progress Notes (Signed)
HOSPITALIST PROGRESS NOTE      Patient: [name] Cook  Date: 09/05/2018   LOS: 0 Days  Admission Date: 09/04/2018   MRN: [id number]  Attending: Teodora Medici  MD, MPH                    Please contact via [name] Cook: [phone]                    Pager: [phone]     ASSESSMENT/PLAN     [name] is a 43 y.o. female admitted with Stroke-like symptoms    Interval Summary:     Active Hospital Problems    Diagnosis    Abdominal pain    CVA (cerebral vascular accident)    Right hemiparesis    Class 2 obesity in adult    History of CVA (cerebrovascular accident)    Facial droop    Stroke-like symptoms    Chest pain     Patient Active Hospital Problem List:   Stroke-like symptoms (08/19/2015) with right hemiparesis and right facial drooping with prior history of CVA    Assessment: Acute presentation noted    Plan: Neurology inputs appreciated  follow-up MRI results  Continue with aspirin 81 mg daily  Continue with Lipitor 40 mg daily     Chest pain (06/17/2015)    Assessment: In the epigastric area; troponins negative x2; EKG unremarkable; possible GERD symptoms    Plan: We will use Maalox plus  Continue with Pepcid     Abdominal pain (09/05/2018)    Assessment: Unclear etiology for the abdominal pain; possible constipation    Plan: Start senna with Colace  Start MiraLAX  Continue with Bentyl  Small dose of IV morphine/oral morphine started for breakthrough pain     Class 2 obesity in adult (05/13/2016)    Assessment: Chronic and related to excess calories    Plan: Lifestyle changes to be discussed at length    Hyperlipidemia  Depression  Seizure  The above to be discussed in more detail      Analgesia: Morphine IV/p.o./Bentyl/Tylenol    Nutrition: Cardiac diet    GI Prophylaxis: None    DVT Prophylaxis: Lovenox    Code Status: Full code    DISPO: Anticipate discharge tomorrow; continue with observation status; unable to discharge today as patient reports severe pain that is unchanged from the  time of admission       SUBJECTIVE     [name] that her abdominal pain and chest pain are severe and have been relentless since admission.  She admits to having nausea but denies any vomiting.  No diarrhea reported.     MEDICATIONS     Current Facility-Administered Medications   Medication Dose Route Frequency    alum & mag hydroxide-simethicone  15 mL Oral Q4H WA    aspirin  81 mg Oral Daily    atorvastatin  40 mg Oral QHS    cefTRIAXone  1 g Intravenous Q24H    enoxaparin  40 mg Subcutaneous Daily    famotidine  20 mg Oral Q12H SCH    Or    famotidine  20 mg Intravenous Q12H SCH    levETIRAcetam  500 mg Oral BID    lidocaine  1 patch Transdermal Daily    lisinopril  40 mg Oral Daily       PHYSICAL EXAM  Vitals:    09/05/18 1200   BP: 124/85   Pulse: 65   Resp: 17   Temp: 98.1 F (36.7 C)   SpO2: 99%       Temperature: Temp  Min: 98 F (36.7 C)  Max: 98.5 F (36.9 C)  Pulse: Pulse  Min: 56  Max: 78  Respiratory: Resp  Min: 13  Max: 20  Non-Invasive BP: BP  Min: 117/77  Max: 146/94  Pulse Oximetry SpO2  Min: 95 %  Max: 100 %    Intake and Output Summary (Last 24 hours) at Date Time    Intake/Output Summary (Last 24 hours) at 09/05/2018 1328  Last data filed at 09/05/2018 1200  Gross per 24 hour   Intake 200 ml   Output 0 ml   Net 200 ml       Physical Exam   Constitutional: She is oriented to person, place, and time and well-developed, well-nourished, and in no distress. No distress.   Obese African-American female in mild distress due to diffuse abdominal and chest pain   HENT:   Head: Normocephalic and atraumatic.   Right Ear: External ear normal.   Left Ear: External ear normal.   Mouth/Throat: Oropharynx is clear and moist.   Eyes: Pupils are equal, round, and reactive to light.   Neck: Normal range of motion. Neck supple. No JVD present. No tracheal deviation present. No thyromegaly present.   Cardiovascular: Normal rate, regular rhythm, normal heart sounds and intact distal pulses. Exam  reveals no gallop and no friction rub.   No murmur heard.  Pulmonary/Chest: Effort normal and breath sounds normal. No stridor. No respiratory distress. She has no wheezes. She has no rales. She exhibits no tenderness.   Abdominal: Soft. Bowel sounds are normal. She exhibits no distension and no mass. There is abdominal tenderness (Diffuse abdominal tenderness more pronounced in the right lower quadrant). There is no rebound and no guarding.   Musculoskeletal: Normal range of motion.         General: No tenderness, deformity or edema.   Lymphadenopathy:     She has no cervical adenopathy.   Neurological: She is alert and oriented to person, place, and time. She has normal reflexes. No cranial nerve deficit. She exhibits normal muscle tone. Gait normal. Coordination normal. GCS score is 15.   Skin: Skin is warm and dry. No rash noted. She is not diaphoretic. No erythema. No pallor.   Psychiatric: Mood, memory, affect and judgment normal.           LABS     Recent Labs   Lab 09/05/18  0348 09/04/18  2017   WBC 11.77* 14.32*   RBC 3.92 4.00   Hgb 8.6* 8.8*   Hematocrit 30.1* 30.4*   MCV 76.8* 76.0*   Platelets 199 215       Recent Labs   Lab 09/05/18  0348 09/04/18  2017   Sodium 141 140   Potassium 3.3* 3.4*   Chloride 109 107   CO2 23 25   BUN 9.0 10.0   Creatinine 0.9 0.9   Glucose 130* 93   Calcium 8.3* 8.9       Recent Labs   Lab 09/04/18  2017   ALT 15   AST (SGOT) 15   Bilirubin, Total 0.6   Bilirubin Direct 0.3   Albumin 3.7   Alkaline Phosphatase 70       Recent Labs   Lab 09/05/18  0348 09/04/18  2017  Troponin I <0.01 <0.01             Microbiology Results     None           Ct Abd/pelvis Without Contrast    Result Date: 09/04/2018  No nephrolithiasis or hydronephrosis. Lorinda Creed, MD  09/04/2018 9:58 PM    Ct Head Without Contrast    Result Date: 09/04/2018    No acute intracranial abnormality Lorinda Creed, MD  09/04/2018 7:53 PM    Xr Chest  Ap Portable    Result Date: 09/04/2018  No acute findings. Carleene Overlie, MD  09/04/2018 9:01 PM    US Transvaginal Non Ob With Ltd Doppler (ovarian Torsion)    Result Date: 09/04/2018  Diffusely heterogeneous myometrium without a discrete fibroid. No sonographic findings of ovarian torsion. Lorinda Creed, MD  09/04/2018 10:16 PM      Echo Results     None          I have personally reviewed the patient's labs, medications, and imaging    Total visit time = 45 mins ; more than 50% spent counseling/coordinating care    Signed,    Teodora Medici MD, MPH  1:28 PM 09/05/2018   Please contact via [employer]  SpectraLink: [phone]  Pager: [phone]       This chart was generated using a medical voice-recognition software which does not employ spell-checking or grammar-checking features. It was dictated, all or in part, in a busy and often noisy patient care environment. I have taken all usual measures to dictate carefully and to review all aspects of this chart. Nonetheless, given the known and well-documented performance characteristics of voice recognition software in such patient care environments, this dictation still may contain unrecognized and wholly unintended errors or omissions.

## 2018-09-05 NOTE — Plan of Care (Signed)
Arrived to unit 25 from ED around 2330 last night. A&O x4. C/o right sided abdominal pain 7/10; pt got Morphine 6 mgIVP x1 in ED 1.5 hours ago; heat packs applied with good effect. Right facial droop; right sided weakness, clear speech; NIHSS score 7. No SOB/ resp distress noted. VSS. SR with PVCs on the monitor. NPO. Ambulates to BR with one standby assist. No BM. Fall/ Aspiration/ Seizure precautions applied. Calm and cooperative with care. Hourly rounding. Continue to monitor.  Problem: Safety  Goal: Patient will be free from injury during hospitalization  Outcome: Progressing  Flowsheets (Taken 09/05/2018 0526)  Patient will be free from injury during hospitalization:   Assess patient's risk for falls and implement fall prevention plan of care per policy   Provide and maintain safe environment   Use appropriate transfer methods   Ensure appropriate safety devices are available at the bedside   Include patient/ family/ care giver in decisions related to safety   Hourly rounding   Assess for patients risk for elopement and implement Elopement Risk Plan per policy   Provide alternative method of communication if needed (communication boards, writing)     Problem: Pain  Goal: Pain at adequate level as identified by patient  Outcome: Progressing  Flowsheets (Taken 09/05/2018 0526)  Pain at adequate level as identified by patient:   Identify patient comfort function goal   Assess for risk of opioid induced respiratory depression, including snoring/sleep apnea. Alert healthcare team of risk factors identified.   Assess pain on admission, during daily assessment and/or before any "as needed" intervention(s)   Reassess pain within 30-60 minutes of any procedure/intervention, per Pain Assessment, Intervention, Reassessment (AIR) Cycle   Evaluate if patient comfort function goal is met   Evaluate patient's satisfaction with pain management progress   Offer non-pharmacological pain management interventions     Problem:  Psychosocial and Spiritual Needs  Goal: Demonstrates ability to cope with hospitalization/illness  Outcome: Progressing  Flowsheets (Taken 09/05/2018 0526)  Demonstrates ability to cope with hospitalizations/illness:   Encourage verbalization of feelings/concerns/expectations   Provide quiet environment   Encourage patient to set small goals for self   Assist patient to identify own strengths and abilities   Encourage participation in diversional activity   Reinforce positive adaptation of new coping behaviors   Include patient/ patient care companion in decisions   Communicate referral to spiritual care as appropriate     Problem: Day of Admission - Stroke  Goal: Core/Quality measure requirements - Admission  Outcome: Progressing  Flowsheets (Taken 09/05/2018 0526)  Core/Quality measure requirements - Admission:   Document NIH Stroke Scale on admission   Document nursing swallow/dysphagia screen on admission. If patient fails, keep patient NPO (follow your hospital protocol on swallowing screening).   VTE Prevention: Ensure anticoagulant(s) administered and/or anti-embolism stockings/devices documented as ordered   If diagnosis or history of Atrial Fib/Atrial Flutter, ensure oral anticoagulation is initiated or contraindication documented by LIP   Ensure lipid panel ordered   Ensure antithrombotic administered or contraindication documented by LIP   Ensure PT/OT and/or SLP ordered     Problem: Every Day - Stroke  Goal: Core/Quality measure requirements - Daily  Outcome: Progressing  Flowsheets (Taken 09/05/2018 0526)  Core/Quality measure requirements - Daily:   VTE Prevention: Ensure anticoagulant(s) administered and/or anti-embolism stockings/devices documented by end of day 2   Ensure antithrombotic administered or contraindication documented by LIP by end of day 2   Once lipid panel has resulted, check LDL. Contact provider  for statin order if LDL > 70 (or ensure contraindication documented by LIP).     Problem:  Every Day - Stroke  Goal: Neurological status is stable or improving  Outcome: Progressing  Flowsheets (Taken 09/05/2018 0526)  Neurological status is stable or improving:   Monitor/assess/document neurological assessment (Stroke: every 4 hours)   Monitor/assess NIH Stroke Scale   Re-assess NIH Stroke Scale for any change in status   Observe for seizure activity and initiate seizure precautions if indicated   Perform CAM Assessment     Problem: Every Day - Stroke  Goal: Stable vital signs and fluid balance  Outcome: Progressing  Flowsheets (Taken 09/05/2018 0526)  Stable vital signs and fluid balance:   Position patient for maximum circulation/cardiac output   Monitor and assess vitals every 4 hours or as ordered and hemodynamic parameters   Monitor intake and output. Notify LIP if urine output is < 30 mL/hour.   Apply telemetry monitor as ordered

## 2018-09-05 NOTE — UM Notes (Signed)
UTILIZATION REVIEW CONTACT: Name: Elfredia Nevins BSN, ACM  Clinical Case Manager  - Utilization Review  [facility]  Address:  [address] [place] [address]  NPI:  [id number]  Tax ID:  [id number]  Phone: [phone]  Fax: [phone]  Email: [email or web address]        PATIENT NAME: Ringle,Hasel  DOB: 13-Sep-1975  PMH:  has a past medical history of Hypercholesteremia, Hypertension, Seizures, Stroke, and TIA (transient ischemic attack) (2017).  PSH:  has a past surgical history that includes Appendectomy; Cholecystectomy; and EGD, BIOPSY (N/A, 04/16/2017).     ADMISSION REVIEW   History of present illness: Pt is a 43 y.o. female arrived at [facility] on 09/04/2018 at 1948.and admitted to [facility]     Arrival VS: Vitals BP: (!) 146/94, Temp: 98.1 F (36.7 C), Temp Source: Oral, Heart Rate: 78, Resp Rate: 20, SpO2: 96 %, Height: 177.8 cm (5\' 10" ), Weight: 114.8 kg (253 lb)       Abnormal Labs: WBC 14.32, H&H 8.8/30.4, K 3.4.      Urine:  Trace LE, 30 protein.      Diagnostics: 1. CT head: No acute intracranial abnormality.  2. CXR: No acute findings.  3. CT abd/pelvis: No nephrolithiasis or hydronephrosis.  4. TVUS: Diffusely heterogeneous myometrium without a discrete fibroid. No sonographic findings of ovarian torsion.    EKG: NSR.  T wave inversion in lead III and aVF, reviewed prior EKG dated on 02/2018-changes.     MD notes:  Patient presented with multiple symptoms.      43 year old female reports that she is having chest pain for past 2 hours, located in middle of the chest, radiating to left arm, nonexertional, constant, stabbing-like chest pain, associated with palpitation, shortness of breath and sweating.  Patient is also complaining of right-sided facial droop started yesterday around 3 PM, associated with headache, right-sided weakness/numbness in arm/leg, right sided facial droop with right-sided facial numbness and double vision in her right eye.  Patient is also  complaining of lower abdominal pain, associated with nausea, vomiting and 4 episodes of nonbloody watery bowel movements.  Patient states that she had a fall 2 days ago, unsure how it happened, also unsure if she hit her head or had seizure episode.     1. Stroke like symptoms: Right facial droop, right-sided weakness (RUE/RLE), paresthesia (R face, RUE/RLE). There was question of Migraine in last hospital admission.  2. HA  3. Diplopia: R eye  4. Hx of CVA: 2015 and 2017  5. Fall at home  6. Hx of seizure    -CT head: No acute intracranial abnormality.  -Consider neuro consult in the morning.  -Neuro checks.  -Swallow evaluation.  Keeping n.p.o. overnight due to facial droop/facial numbness to prevent aspiration.   -MRI brain.   -Continue ASA and Lipitor.   -TSH, lipid profile and A1C.  -PT/OT  -Fall precaution  -Continue Keppra with seizure precaution.  Add on Keppra level.    7. CP  8. Palpitation  9. Dyspnea    -Reviewed CXR: No acute findings.  -Reviewed EKG: NSR. T wave inversion in lead III and aVF, reviewed prior EKG dated on 02/2018-changes.  -Reviewed echo report of 09/2017.  -Telemetry monitoring.  Will trend troponin.  -Labs and meds as above.  -On RA with 98% saturation, not in resp distress.    10. GI symptoms: Nausea, vomiting and diarrhea    -CT abd/pelvis: No nephrolithiasis or hydronephrosis.  -TVUS: Diffusely heterogeneous myometrium  without a discrete fibroid. No sonographic findings of ovarian torsion.  -Will check UA, stool WBC. With suprapubic pain and leukocytosis, giving Ceftriaxone.  -Zofran IV prn.  -LFTs unremarlkable    11. HTN  12. HLD    -Continue lisinopril and Lipitor.    13. Depression    -Continue Lexapro.    14. Obesity    Lifestyle modification with diet and exercise.    15. Microcytic anemia: Chronic. Hb 8.8 and MCV 76.0    -Repeat CBC.  Will check iron profile and stool occult.    Nutrition  NPO    DVT/VTE Prophylaxis  SCD/Lovenox.     Anticipated medical  stability for discharge: 1-2 days.    Service status/Reason for ongoing hospitalization: Observation/Multiple problems.   Anticipated Discharge Needs: To be determined.     Admit to    09/04/18 2218  [facility]        Dx: Facial droop     ED medications: iv zofran, iv ns, iv morphine, Klor-con     Scheduled Meds:  Current Facility-Administered Medications   Medication Dose Route Frequency   . aspirin  81 mg Oral Daily   . atorvastatin  40 mg Oral QHS   . cefTRIAXone  1 g Intravenous Q24H   . enoxaparin  40 mg Subcutaneous Daily   . famotidine  20 mg Oral Q12H SCH    Or   . famotidine  20 mg Intravenous Q12H SCH   . levETIRAcetam  500 mg Oral BID   . lidocaine  1 patch Transdermal Daily   . lisinopril  40 mg Oral Daily     PRN Meds:.alum & mag hydroxide-simethicone, naloxone, nitroglycerin, ondansetron **OR** ondansetron

## 2018-09-05 NOTE — Plan of Care (Addendum)
Problem: Pain  Goal: Pain at adequate level as identified by patient  Outcome: Progressing  Flowsheets (Taken 09/05/2018 1226)  Pain at adequate level as identified by patient:  . Identify patient comfort function goal  . Assess for risk of opioid induced respiratory depression, including snoring/sleep apnea. Alert healthcare team of risk factors identified.  . Assess pain on admission, during daily assessment and/or before any "as needed" intervention(s)  . Evaluate if patient comfort function goal is met  . Reassess pain within 30-60 minutes of any procedure/intervention, per Pain Assessment, Intervention, Reassessment (AIR) Cycle  . Offer non-pharmacological pain management interventions  . Evaluate patient's satisfaction with pain management progress  . Include patient/patient care companion in decisions related to pain management as needed     Problem: Every Day - Stroke  Goal: Neurological status is stable or improving  Outcome: Progressing  Flowsheets (Taken 09/05/2018 1226)  Neurological status is stable or improving:  Marland Kitchen Monitor/assess/document neurological assessment (Stroke: every 4 hours)  . Observe for seizure activity and initiate seizure precautions if indicated  . Perform CAM Assessment  Goal: Stable vital signs and fluid balance  Outcome: Progressing  Flowsheets (Taken 09/05/2018 1226)  Stable vital signs and fluid balance:  . Position patient for maximum circulation/cardiac output  . Monitor and assess vitals every 4 hours or as ordered and hemodynamic parameters  . Encourage oral fluid intake  . Apply telemetry monitor as ordered    A/o x4. Not on respiratory distress. With right facial droop, right sided body weakness but cleared speech. Denies chest pain or palpitation. NSR on Tele. On room air. Seen by Neuro and MRI was done. Speech Therapy seen pt and recommended regular diet and thin liquid. Seen by Dr. Gaynelle Adu. For pain management, Neuro check and PT/OT. Safe environment provided. Will continue  to monitor.

## 2018-09-05 NOTE — Plan of Care (Signed)
Goals:  Patient will adhere to swallow safety precautions 100% of the time during meals.  Pt will demonstrate adequate oral phase in order to safely manage  Regular and thin liquids for 3 meals /day without overt s/s aspiration and use of min cues for comp swallowing strategies.   Pt will use strategies to improve swallow function to tolerate the least restrictive diet without s/s aspiration or penetration provided with supervision.        Plan/Recommendations:  Diet Solids Recommendation: regular  Diet Liquids Recommendations: thin consistency  Precautions/Compensations: Awake/alert, Upright 90 degrees for all oral intake, 45 degrees upright after meals, Alternate solids and liquids, Swallow multiple times per bite/sip  Recommendation Discussed With: : Patient, Nurse  SLP Frequency Recommended: 1-2x/wk  Administration of Medications: PO  Aspiration Precautions posted at bedside: d/ w pt and RN

## 2018-09-05 NOTE — OT Eval Note (Signed)
[facility]   Occupational Therapy Attempt Note    Patient:  [name] Cook MRN#:  [id number]  Unit:  [facility] Room/Bed:  V4098/J1914-N      Occupational Therapy evaluation attempted on 09/05/2018 at 4:08 PM unable to complete secondary to patient declining 2/2 to increased pain. Therapist will reattempt tomorrow per pt request.     RN aware.        [place], MSOT, [place] [id number]  09/05/2018 4:08 PM

## 2018-09-05 NOTE — Plan of Care (Signed)
Discharge Recommendation: Home with supervision, Home with outpatient PT  DME Recommended for Discharge: Front wheel walker    Is an Occupational Therapy Evaluation Indicated at this time? Yes, an OT evaluation is indicated at this time.    Treatment/Interventions: Exercise, Gait training, Stair training, Neuromuscular re-education, Functional transfer training, LE strengthening/ROM  PT Frequency: 2-3x/wk     Next Visit Recommendation: PT - Next Visit Recommendation: 09/06/18     PMP Activity: Step 6 - Walks in Room  Distance Walked (ft) (Step 6,7): 10 Feet  (Please See Therapy Evaluation for device and assistance level needed)    Goals:   Goals  Goal Formulation: With patient  Time for Goal Acheivement: By time of discharge  Goals: Select goal  Pt Will Demo Standing Balance: 4+/5 indep, moves/returns center of grav in multiple planes 1-2", to maximize functional mobility and independence  Pt Will Transfer Bed/Chair: with rolling walker, modified independent, to maximize functional mobility and independence  Pt Will Ambulate: 151-200 feet, with rolling walker, with stand by assist, to maximize functional mobility and independence  Pt Will Go Up / Down Stairs: 1-2 stairs, with contact guard assist, to maximize functional mobility and independence

## 2018-09-05 NOTE — PT Eval Note (Signed)
[facility]  Physical Therapy Evaluation and Treatment    Patient: [name] Cook  MRN#: [id number]  Unit: 25 [facility]  Bed: V4098/J1914-N    Time of Evaluation and Treatment:  Time Calculation  PT Received On: 09/05/18  Start Time: 0825  Stop Time: 0848  Time Calculation (min): 23 min    Evaluation Time:15 minutes  Treatment Time:    Chart Review and Collaboration with Care Team: 5 minutes, not included in above time    PT Visit Number: 1    Consult received for [name] for PT Evaluation and Treatment.  Patient's medical condition is appropriate for Physical therapy intervention at this time.    Activity Orders:  PT eval and treat    Precautions and Contraindications:  Precautions  Weight Bearing Status: no restrictions  Other Precautions: fall risk    Personal Protective Equipment (PPE)  gloves and procedure mask    Medical Diagnosis:  Hypokalemia [E87.6]  Weakness [R53.1]  Facial droop [R29.810]  Facial droop [R29.810]  CVA (cerebral vascular accident) [I63.9]    History of Present Illness:  [name] Cook is a 43 y.o. female admitted on 09/04/2018 with chest pain, SOB, sweating, R facial droop and R LE/UE weakness. Also c/o abdominal pain.    Patient Active Problem List   Diagnosis   . Chest pain   . Precordial pain   . Stroke-like symptoms   . Seizures   . Facial droop   . Tension type headache   . History of CVA (cerebrovascular accident)   . Morbid obesity   . Hematemesis   . Blood loss anemia   . HLD (hyperlipidemia)   . Numbness and tingling   . Hemorrhoids   . Right arm weakness   . Nonspecific abnormal electroencephalogram (EEG)   . Headache   . CVA (cerebral vascular accident)       Past Medical/Surgical History:  Past Medical History:   Diagnosis Date   . Hypercholesteremia    . Hypertension    . Seizures    . Stroke     2015 and 2017, right side   . TIA (transient ischemic attack) 2017     Past Surgical History:   Procedure Laterality Date   . APPENDECTOMY     .  CHOLECYSTECTOMY     . EGD, BIOPSY N/A 04/16/2017    Procedure: EGD, BIOPSY;  Surgeon: Pershing Proud, MD;  Location: [facility];  Service: Gastroenterology;  Laterality: N/A;       X-Rays/Tests/Labs:  Lab Results   Component Value Date/Time    HGB 8.6 (L) 09/05/2018 03:48 AM    HCT 30.1 (L) 09/05/2018 03:48 AM    K 3.3 (L) 09/05/2018 03:48 AM    NA 141 09/05/2018 03:48 AM    INR 1.0 03/03/2018 09:29 PM    TROPI <0.01 09/05/2018 03:48 AM    TROPI <0.01 09/04/2018 08:17 PM    TROPI <0.01 03/05/2018 02:35 AM    TROPI <0.01 03/04/2018 04:07 AM       All imaging reviewed, please see chart for details.    Social History:  Prior Level of Function  Prior level of function: Ambulates independently, Needs assistance with ADLs, Other (Comment)(assist with ADL "sometimes")  Baseline Activity Level: Community ambulation  Cooking: Yes  DME Currently at Home: (none)    Home Living Arrangements  Living Arrangements: Spouse/significant other, Children  Type of Home: House  Home Layout: One level, Stairs to enter without rails (add number in  comment)(2STE)  DME Currently at Home: (none)  Home Living - Notes / Comments: family available to assist as needed      Subjective:  Patient is agreeable to participation in the therapy session. Nursing clears patient for therapy.  Patient Goal: unstated  Pain Assessment  Pain Assessment: Numeric Scale (0-10)  Pain Score: (pt does not rate, "it hurts, getting worse")  Pain Location: Abdomen  Pain Frequency: Increases with movement      Objective:  Observation of Patient/Vital Signs:  BP 130/85 mmHg, HR 63 bpm    Patient received in bed with telemetry  and bed alarm  in place.         Cognitive Status and Neuro Exam:  Cognition/Neuro Status  Arousal/Alertness: Appropriate responses to stimuli  Attention Span: Appears intact  Orientation Level: Oriented X4  Memory: Appears intact  Following Commands: Follows all commands and directions without difficulty  Safety Awareness:  independent  Insights: Fully aware of deficits  Behavior: calm;cooperative;attentive  Motor Planning: intact    Musculoskeletal Examination  Gross ROM  Neck/Trunk ROM: within functional limits  Right Upper Extremity ROM: within functional limits  Left Upper Extremity ROM: within functional limits  Right Lower Extremity ROM: within functional limits  Left Lower Extremity ROM: within functional limits  Gross Strength  Neck/Trunk Strength: WFL  Right Upper Extremity Strength: 3/5  Left Upper Extremity Strength: 4/5  Right Lower Extremity Strength: 3/5  Left Lower Extremity Strength: 4/5       Functional Mobility:  Functional Mobility  Rolling: Supervision  Supine to Sit: Supervision  Scooting to EOB: Supervision  Sit to Supine: Supervision  Sit to Stand: Risk analyst  Stand to Sit: Contact Guard Assist  Transfers  Bed to Chair: Risk analyst  Chair to Bed: Cabin crew Used for Functional Transfer: front-wheeled walker  Locomotion  Ambulation: Contact Guard Assist;with front-wheeled walker(10)  Pattern: decreased step length;decreased cadence     Balance  Balance  Balance: needs focused assessment  Sitting - Dynamic: Good  Standing - Dynamic: Good(with AD)    Participation and Activity Tolerance  Participation and Endurance  Participation Effort: good  Endurance: Tolerates < 10 min exercise, no significant change in vital signs  [place] Dyspnea Scale: 0 Dyspnea    Educated the patient to role of physical therapy, plan of care, goals of therapy and safety with mobility and ADLs.    Patient left in bed with bed alarm in place and call bell and all personal items/needs within reach. RN notified of session outcome.      Assessment:  [name] Cook is a 43 y.o. female admitted 09/04/2018. Pt would benefit from Physical Therapy to address deficits and increase functional independence. There are few comorbidities or other factors that affect plan of care and require modification of task  including:residual neurological symptoms.  Pt's functional mobility is impacted by: activity tolerance/endurance, balance, pain and strength.  Standardized tests and exams incorporated into evaluation include AMPAC mobility, ROM  and Strength.   Pt demonstrates a stable clinical presentation.      Pt demo R hemiparesis and numbness, min impaired balance and limitedactivity tolerance upon eval. Pt completed bed mobility and transfers without assist. Ambulated limited functional distance with RW and CGA, no LOB but pt "feels weak". Will benefit from continued f/u during admission.     Complexity Level Hx and Co-  morbidites Examination Clinical Decision Making Clinical Presentation   Low no impact 1-2 elements Limited options  Stable       Treatment:  - standing balance activities with RW and CGA, including A/P and lateral stepping with R/L LE, no LOB t/o and noted increased UE reliance on AD  - pt amb additional 10x2' with RW and CGA, pt mobility slowed but noted adequate foot clearance during amb  - instructed on use of RW for support and safety during mobility on even/uneven surfaces      Plan:  Treatment/Interventions: Exercise, Gait training, Stair training, Neuromuscular re-education, Functional transfer training, LE strengthening/ROM  PT Frequency: 2-3x/wk  Risks/Benefits/POC Discussed with Pt/Family: With patient    PMP Activity: Step 6 - Walks in Room  Distance Walked (ft) (Step 6,7): 10 Feet      Goals:  Goals  Goal Formulation: With patient  Time for Goal Acheivement: By time of discharge  Goals: Select goal  Pt Will Demo Standing Balance: 4+/5 indep, moves/returns center of grav in multiple planes 1-2", to maximize functional mobility and independence  Pt Will Transfer Bed/Chair: with rolling walker, modified independent, to maximize functional mobility and independence  Pt Will Ambulate: 151-200 feet, with rolling walker, with stand by assist, to maximize functional mobility and independence  Pt Will Go Up  / Down Stairs: 1-2 stairs, with contact guard assist, to maximize functional mobility and independence    AM-PAC:yes        PT Basic Mobility Raw Score: 21  CMS 0-100% Score: 28.97%                DME Recommended for Discharge: Front wheel walker  Discharge Recommendation: Home with supervision, Home with outpatient PT    [place], [place]  [id number]  09/05/2018  11:12 AM

## 2018-09-06 LAB — LEVETIRACETAM LEVEL: Levetiracetam (Keppra): 8.2 — ABNORMAL LOW (ref 12.0–46.0)

## 2018-09-06 MED ORDER — TOPIRAMATE 25 MG PO TABS
25.0000 mg | ORAL_TABLET | Freq: Every evening | ORAL | Status: DC
Start: 2018-09-06 — End: 2018-09-06

## 2018-09-06 MED ORDER — FLEET ENEMA 7-19 GM/118ML RE ENEM
1.00 | ENEMA | Freq: Once | RECTAL | Status: DC
Start: 2018-09-06 — End: 2018-09-06
  Filled 2018-09-06: qty 1

## 2018-09-06 MED ORDER — TOPIRAMATE 25 MG PO TABS
25.00 mg | ORAL_TABLET | Freq: Every evening | ORAL | 0 refills | Status: AC
Start: 2018-09-06 — End: 2018-10-06

## 2018-09-06 MED ORDER — SENNOSIDES-DOCUSATE SODIUM 8.6-50 MG PO TABS
2.00 | ORAL_TABLET | Freq: Every evening | ORAL | 0 refills | Status: AC
Start: 2018-09-06 — End: 2018-09-21

## 2018-09-06 NOTE — Plan of Care (Signed)
Problem: Safety  Goal: Patient will be free from injury during hospitalization  09/06/2018 1802 by Salomon Fick, RN  Outcome: Completed  09/06/2018 1041 by Salomon Fick, RN  Outcome: Progressing  Flowsheets (Taken 09/06/2018 1041)  Patient will be free from injury during hospitalization:   Assess patient's risk for falls and implement fall prevention plan of care per policy   Use appropriate transfer methods   Provide and maintain safe environment   Ensure appropriate safety devices are available at the bedside   Hourly rounding   Include patient/ family/ care giver in decisions related to safety   Assess for patients risk for elopement and implement Elopement Risk Plan per policy   Provide alternative method of communication if needed (communication boards, writing)  Goal: Patient will be free from infection during hospitalization  Outcome: Completed     Problem: Pain  Goal: Pain at adequate level as identified by patient  09/06/2018 1802 by Salomon Fick, RN  Outcome: Completed  09/06/2018 1041 by Salomon Fick, RN  Outcome: Progressing  Flowsheets (Taken 09/06/2018 1041)  Pain at adequate level as identified by patient:   Identify patient comfort function goal   Assess for risk of opioid induced respiratory depression, including snoring/sleep apnea. Alert healthcare team of risk factors identified.   Assess pain on admission, during daily assessment and/or before any "as needed" intervention(s)   Evaluate patient's satisfaction with pain management progress   Offer non-pharmacological pain management interventions   Consult/collaborate with Pain Service   Evaluate if patient comfort function goal is met   Consult/collaborate with Physical Therapy, Occupational Therapy, and/or Speech Therapy     Problem: Side Effects from Pain Analgesia  Goal: Patient will experience minimal side effects of analgesic therapy  09/06/2018 1802 by Salomon Fick, RN  Outcome: Completed  09/06/2018 1041 by Salomon Fick, RN  Outcome:  Progressing  Flowsheets (Taken 09/06/2018 1041)  Patient will experience minimal side effects of analgesic therapy:   Monitor/assess patient's respiratory status (RR depth, effort, breath sounds)   Assess for changes in cognitive function   Prevent/manage side effects per LIP orders (i.e. nausea, vomiting, pruritus, constipation, urinary retention, etc.)   Evaluate for opioid-induced sedation with appropriate assessment tool (i.e. POSS)     Problem: Discharge Barriers  Goal: Patient will be discharged home or other facility with appropriate resources  Outcome: Completed     Problem: Psychosocial and Spiritual Needs  Goal: Demonstrates ability to cope with hospitalization/illness  Outcome: Completed     Problem: Moderate/High Fall Risk Score >5  Goal: Patient will remain free of falls  Outcome: Completed     Problem: Day of Admission - Stroke  Goal: Core/Quality measure requirements - Admission  Outcome: Completed     Problem: Every Day - Stroke  Goal: Core/Quality measure requirements - Daily  09/06/2018 1802 by Salomon Fick, RN  Outcome: Completed  09/06/2018 1041 by Salomon Fick, RN  Outcome: Progressing  Flowsheets (Taken 09/06/2018 1041)  Core/Quality measure requirements - Daily:   VTE Prevention: Ensure anticoagulant(s) administered and/or anti-embolism stockings/devices documented by end of day 2   Ensure antithrombotic administered or contraindication documented by LIP by end of day 2   Once lipid panel has resulted, check LDL. Contact provider for statin order if LDL > 70 (or ensure contraindication documented by LIP).   Continue stroke education (must include Modifiable Risk Factors, Warning Signs and Symptoms of Stroke, Activation of Emergency Medical System and Follow-up Appointments). Ensure handout has been given and documented.  Goal:  Neurological status is stable or improving  09/06/2018 1802 by Salomon Fick, RN  Outcome: Completed  09/06/2018 1041 by Salomon Fick, RN  Outcome:  Progressing  Flowsheets (Taken 09/06/2018 1041)  Neurological status is stable or improving:   Monitor/assess/document neurological assessment (Stroke: every 4 hours)   Monitor/assess NIH Stroke Scale   Perform CAM Assessment   Observe for seizure activity and initiate seizure precautions if indicated  Goal: Stable vital signs and fluid balance  09/06/2018 1802 by Salomon Fick, RN  Outcome: Completed  09/06/2018 1041 by Salomon Fick, RN  Outcome: Progressing  Flowsheets (Taken 09/06/2018 1041)  Stable vital signs and fluid balance:   Position patient for maximum circulation/cardiac output   Monitor and assess vitals every 4 hours or as ordered and hemodynamic parameters   Encourage oral fluid intake   Apply telemetry monitor as ordered   Monitor intake and output. Notify LIP if urine output is < 30 mL/hour.  Goal: Patient will maintain adequate oxygenation  Outcome: Completed  Goal: Patient's risk of aspiration will be minimized  Outcome: Completed  Goal: Nutritional intake is adequate  Outcome: Completed  Goal: Elimination patterns are normal or improving  Outcome: Completed  Goal: Mobility/Activity is maintained at optimal level for patient  Outcome: Completed  Goal: Skin integrity is maintained or improved  Outcome: Completed  Goal: Neurovascular status is stable or improving  09/06/2018 1802 by Salomon Fick, RN  Outcome: Completed  09/06/2018 1041 by Salomon Fick, RN  Outcome: Progressing  Flowsheets (Taken 09/06/2018 1041)  Neurovascular status is stable or improving:   Monitor/assess neurovascular status (pulses, capillary refill, pain, paresthesia, presence of edema)   Monitor/assess for signs of Venous Thrombus Emboli (edema of calf/thigh redness, pain)   Monitor/assess site of invasive procedure for signs of bleeding  Goal: Effective coping demonstrated  Outcome: Completed  Goal: Will be able to express needs and understand communication  Outcome: Completed     Problem: Day of Discharge - Stroke  Goal:  Able to express understanding of discharge instructions and stroke education  Outcome: Completed  Goal: Core/Quality measures - Discharge  Outcome: Completed     Patient discharged home. Going home instruction discussed with patient. Iv and heart monitor discontinued. Discharge education done and patient is able to teach back.  refused transport with wheelchair, walked with patient to elevators.

## 2018-09-06 NOTE — Discharge Instr - AVS First Page (Addendum)
[facility] DISCHARGE INSTRUCTIONS     Date of Admission: 09/04/2018    Date of Discharge: 09/06/2018      Consultants: Neurology    Pending Studies: None     Further Instructions: Please follow up with the providers/clinics listed below.     Surgery/Procedure: Ct Abd/pelvis Without Contrast    Result Date: 09/04/2018  No nephrolithiasis or hydronephrosis. Lorinda Creed, MD  09/04/2018 9:58 PM    Ct Head Without Contrast    Result Date: 09/04/2018    No acute intracranial abnormality Lorinda Creed, MD  09/04/2018 7:53 PM    Mri Brain Wo Contrast    Result Date: 09/05/2018   Motion artifact. No acute intracranial abnormalities. Georgana Curio, MD  09/05/2018 6:24 PM    Xr Chest  Ap Portable    Result Date: 09/04/2018  No acute findings. Carleene Overlie, MD  09/04/2018 9:01 PM    US Transvaginal Non Ob With Ltd Doppler (ovarian Torsion)    Result Date: 09/04/2018  Diffusely heterogeneous myometrium without a discrete fibroid. No sonographic findings of ovarian torsion. Lorinda Creed, MD  09/04/2018 10:16 PM      Admission Diagnosis: Hypokalemia [E87.6]  Weakness [R53.1]  Facial droop [R29.810]  Facial droop [R29.810]  CVA (cerebral vascular accident) [I63.9]    Problem List  Active Hospital Problems    Diagnosis   . Abdominal pain   . Right hemiparesis   . Class 2 obesity in adult   . History of CVA (cerebrovascular accident)   . Facial droop   . Stroke-like symptoms   . Chest pain          Discharge Medications     Medication List      START taking these medications    senna-docusate 8.6-50 MG per tablet  Commonly known as:  PERICOLACE  Take 2 tablets by mouth nightly for 15 days     topiramate 25 MG tablet  Commonly known as:  TOPAMAX  Take 1 tablet (25 mg total) by mouth nightly For migraine        CONTINUE taking these medications    aspirin 81 MG chewable tablet  Chew 1 tablet (81 mg total) by mouth daily     atorvastatin 40 MG tablet  Commonly known as:  LIPITOR     KEPPRA PO     lisinopril 40 MG tablet  Commonly known as:   ZESTRIL           Where to Get Your Medications      These medications were sent to [id number] - [place] - [address] [facility]  [address] [place], [place] [address]    Phone:  [phone]    senna-docusate 8.6-50 MG per tablet   topiramate 25 MG tablet         Follow up Appointments  Follow-up Information     Elnour, Roseanne Reno, MD. Schedule an appointment as soon as possible for a visit in 1 week(s).    Specialties:  Epilepsy, Neurology  Why:  Neurology Clinic   Contact information:  [address]  [place] [address]  [phone]             Troy Sine MD. Schedule an appointment as soon as possible for a visit in 1 week(s).    Why:  Primary Care Clinic                    Call Your  Doctor if you have:   Chest pain   Shortness of breath or difficulty breathing   Temperature greater than 100.4   Persistent nausea and vomiting   Severe uncontrolled pain   Signs of infection (pain, swelling, redness, tenderness, odor, or green/yellow discharge)   Headache or visual disturbances   Hives   Persistent dizziness or light-headedness   Extreme fatigue   Bleeding   Any other questions or concerns you may have after discharge   In an emergency, call 911 or go to an Emergency Department at a nearby hospital     Additional Instructions:   -Schedule a follow up appointment with your Primary Care Provider within 1 week of discharge.   -Carry a list of your medications and allergies with you at all times   -Resume your usual diet unless otherwise directed. Good nutrition promotes quicker healing.   -Call your pharmacy at least 1 week in advance to refill prescriptions     Information on Taking Medicine Safely   Medicine is given to help treat or prevent illness. But if you don't take it correctly, it might not help. It might even harm you. Your doctor or pharmacist can help you learn the right way to take your medicine. Listed below are some tips to help you take  medicine safely.     Safety Tips   1. Have a routine for taking each medicine. Make it part of something you do each day, such as brushing your teeth or eating a meal.   2. When you go to the hospital or your doctor's office, bring all your current medicines in their original boxes or bottles. If you can't do that, bring an up-to-date list of your medicines.   3. Do not stop taking a prescription medicine unless your doctor tells you to. Doing so could make your condition worse.   4. Do not share medicines.   5. Let your doctor and pharmacist know of any allergies you have.   6. Taking prescription medicines with alcohol, street drugs, herbs, supplements, or even some over-the-counter medicines can be harmful. Talk to your doctor or pharmacist before using any of these things while taking a prescription medcine.   7. When filling your prescriptions, try using the same pharmacy for all your medicines. If not, let the pharmacist know what medicines you are already taking.   8. Keep medicines out of the reach of children and pets. Store medicines in a cool, dry, dark place--not in the bathroom or in the kitchen near moisture or heat.   9. Do not use medicine that has expired or that doesn't look or smell right. Bring expired or old medicine to your pharmacy for disposal.     Using Generic Medicines   Medicines have brand names and generic (chemical) names. When a medicine is first made, it is sold only under its brand name. Later, it can be made and sold as a generic. Generic medicines cost less than brand-name medicines and most work just as well. Unless their doctor says otherwise, most people can use the generic medicine instead of the brand-name medicine.     Always follow your healthcare professional's instructions.     If you are unable to obtain an appointment, unable to obtain newly prescribed medications, or are unclear about any of your discharge instructions please contact me at [phone] (M-F, 8am-3pm)  or weekends and after hours via the hospital operator [phone], the hospital case manager, or your primary care  physician.    Finally, as your discharging physician, you may be receiving a survey which is regarding my care. I would greatly value and appreciate your feedback as I strive for excellence.     Respectfully yours,    Murlean Hark MD, MPH  [facility]  [facility]

## 2018-09-06 NOTE — OT Eval Note (Signed)
[facility]  Occupational Therapy Evaluation and Treatment    Patient: [name] Cook  MRN#: [id number]   Unit: [address] [facility]  Bed: A2508/A2508-A    Time of Evaluation and Treatment:  Time Calculation  OT Received On: 09/06/18  Start Time: 1241  Stop Time: 1314  Time Calculation (min): 33 min    Chart Review and Collaboration with Care Team: 6 minutes, not included in above time.    Evaluation: 8 minutes  Treatment:  25 minutes    OT Visit Number: 1    Consult received for [name] Cook for OT Evaluation and Treatment.  Patient's medical condition is appropriate for Occupational therapy intervention at this time.    Activity Orders:  OT eval and treat and progressive mobility protocol    Precautions and Contraindications:  Precautions  Weight Bearing Status: no restrictions  Other Precautions: fall risk    Personal Protective Equipment (PPE)  gloves, procedure mask, surgical / bouffant cap and shoe covers    Medical Diagnosis:  Hypokalemia [E87.6]  Weakness [R53.1]  Facial droop [R29.810]  Facial droop [R29.810]  CVA (cerebral vascular accident) [I63.9]    History of Present Illness:  [name] Cook is a 43 y.o. female admitted on 09/04/2018  with chest pain, SOB, sweating, R facial droop and R LE/UE weakness. Also c/o abdominal pain.    Patient Active Problem List   Diagnosis   . Chest pain   . Stroke-like symptoms   . Seizures   . Facial droop   . Tension type headache   . History of CVA (cerebrovascular accident)   . Class 2 obesity in adult   . Hematemesis   . Blood loss anemia   . HLD (hyperlipidemia)   . Numbness and tingling   . Hemorrhoids   . Right hemiparesis   . Nonspecific abnormal electroencephalogram (EEG)   . Headache   . CVA (cerebral vascular accident)   . Abdominal pain        Past Medical/Surgical History:  Past Medical History:   Diagnosis Date   . Hypercholesteremia    . Hypertension    . Seizures    . Stroke     2015 and 2017, right side   . TIA (transient ischemic attack)  2017     Past Surgical History:   Procedure Laterality Date   . APPENDECTOMY     . CHOLECYSTECTOMY     . EGD, BIOPSY N/A 04/16/2017    Procedure: EGD, BIOPSY;  Surgeon: Pershing Proud, MD;  Location: [facility];  Service: Gastroenterology;  Laterality: N/A;        X-Rays/Tests/Labs  Lab Results   Component Value Date/Time    HGB 8.6 (L) 09/05/2018 03:48 AM    HCT 30.1 (L) 09/05/2018 03:48 AM    K 3.3 (L) 09/05/2018 03:48 AM    NA 141 09/05/2018 03:48 AM    INR 1.0 03/03/2018 09:29 PM    TROPI <0.01 09/05/2018 03:48 AM    TROPI <0.01 09/04/2018 08:17 PM    TROPI <0.01 03/05/2018 02:35 AM    TROPI <0.01 03/04/2018 04:07 AM       All imaging reviewed. Please see chart for details      Social History:  Prior Level of Function  Prior level of function: Independent with ADLs, Ambulates independently, Needs assistance with ADLs(Needs ADL assist 'sometimes')  Baseline Activity Level: Community ambulation  Driving: independent  Cooking: Yes  Employment: Unemployed  DME Currently at Home: Other (Comment)(none)  Home Living Arrangements  Living Arrangements: Spouse/significant other  Type of Home: House  Home Layout: One level, Stairs to enter without rails (add number in comment)(2 STE)  Bathroom Shower/Tub: Medical sales representative: Standard  Bathroom Accessibility: Accessible via walker  DME Currently at Home: Other (Comment)(none)  Home Living - Notes / Comments: family available to assist as needed      Subjective:  Subjective: "I have tingling all down my right arm and leg."   Patient is agreeable to participation in the therapy session. Nursing clears patient for therapy.      Pain Assessment  Pain Assessment: No/denies pain      Objective:      Observation of Patient/Vital Signs:VSS  Patient received in bed with telemetry  and bed alarm  in place.    Cognitive Status and Neuro Exam:  Cognition/Neuro Status  Arousal/Alertness: Appropriate responses to stimuli  Attention Span: Appears intact  Orientation  Level: Oriented X4  Memory: Appears intact  Following Commands: independent  Safety Awareness: minimal verbal instruction  Insights: Fully aware of deficits  Problem Solving: Able to problem solve independently  Behavior: attentive, calm, cooperative  Motor Planning: intact  Coordination: intact  Hand Dominance: right handed    Musculoskeletal Examination  Gross ROM  Right Upper Extremity ROM: within functional limits  Left Upper Extremity ROM: within functional limits  Gross Strength  Right Upper Extremity Strength: 3+/5  Left Upper Extremity Strength: within functional limits       Sensory/Oculomotor Examination  Sensory  Tactile - Light Touch: impaired right(R arm and leg paresthesias)         Activities of Daily Living  Self-care and Home Management  LB Dressing: Stand by Assist  Toileting: Stand by Assist  Functional Transfers: Stand by Assist(With RW)    Functional Mobility:  Mobility and Transfers  Supine to Sit: Independent  Sit to Supine: Modified Independent(Using bed rail)  Functional Mobility/Ambulation: Stand by Assist(With RW)     Balance  Balance  Static Sitting Balance: Independent  Dyanamic Sitting Balance: Supervision  Static Standing Balance: Supervision  Dynamic Standing Balance: Stand by Assist    Participation and Activity Tolerance  Participation and Endurance  Participation Effort: excellent  Endurance: Endurance does not limit participation in activity    Educated the patient to role of occupational therapy, plan of care, goals of therapy and safety with mobility and ADLs, discharge instructions.    Patient left in bed with bed alarm in place and call bell and all personal items/needs within reach. RN notified of session outcome.       Assessment:  [name] Cook is a 43 y.o. female admitted 09/04/2018. Patient would benefit from continued skilled OT to address deficits listed below and increase functional independence.      A Brief chart review was completed including review of labs, review  of imaging and review of vitals.  There are few comorbidities or other factors that affect plan of care and require modification of task including:residual neurological symptoms.  Pt demonstrates performance deficits with bathing, dressing, toileting, functional mobility and functional transfers.  Pt's ability to complete ADLs and functional transfers is impaired due to the following deficits:  balance, gait, strength and sensation.     Assessment: decreased strength;balance deficits;decreased independence with ADLs;decreased independence with IADLs    Complexity Chart  Review Performance  Deficits Clinical Decision  Making Hx/Co-  morbidities Assistance needed   Low Brief 1-3 Limited options None None (or at baseline)  Treatment:  -Pt trained on safety during toileting and grooming routine.  Instruction required for safety with use of RW initially though excellent carryover demonstrated from pt following initial instruction being provided.  -Pt trained on tub transfer with use of tub bench.  Excellent carryover demonstrated from pt on safe technique  -Pt educated on safety strategies to implement with impaired sensation in RUE during ADLs.  Pt verbalized understanding.  -Discussed d/c recommendations with pt in regards to supervision for ADLs at home and OP therapy therapy as well as equipment recommendations.  Pt verbalized understanding of recommendations.    Rehabilitation Potential: Prognosis: Good, With family, With continued OT s/p acute discharge    Plan:  OT Frequency Recommended: 1-2x/wk   Treatment Interventions: ADL retraining, Functional transfer training, UE strengthening/ROM, Patient/Family training, Equipment eval/education, Compensatory technique education     PMP Activity: Step 6 - Walks in Room  Distance Walked (ft) (Step 6,7): 50 Feet    Risks/benefits/POC discussed    AM-PAC:yes  OT Daily Activity Raw Score: 20  CMS 0-100% Score: 38.32%             Goals:  Time For Goal Achievement: by time  of discharge  ADL Goals  Patient will dress lower body: Modified Independent, Partly met(Stand by assist)  Pt will complete bathing: Modified Independent, Not met(Not addressed this session)  Patient will toilet: Modified Independent, Partly met(Stand by assist)  Mobility and Transfer Goals  Pt will perform bathtub transfer: Modified Independent, Partly met(Stand by assist with tub bench)     Musculoskeletal Goals  Pt will perform Home Exercise Program: independent, to increase engagement in ADLs(RUE strengthening HEP)                       DME Recommended for Discharge: Tub transfer bench, BSC, Front wheel walker, Other (Comment)(Bed enabler)  Discharge Recommendation: Home with supervision, Other(Comment)(Home with outpatient PT/OT)      Rodrigo Ran, OTR/L  [id number]    09/06/2018 2:24 PM

## 2018-09-06 NOTE — Plan of Care (Signed)
Discharge Recommendation: Home with supervision, Other(Comment)(Home with outpatient PT/OT)   DME Recommended for Discharge: Tub transfer bench, BSC, Front wheel walker, Other (Comment)(Bed enabler)    OT Frequency Recommended: 1-2x/wk     Next Visit Recommendation: OT - Next Visit Recommendation: 09/10/18       Is PT evaluation indicated at this time? This patient is already on PT caseload.     PMP Activity: Step 6 - Walks in Room  Distance Walked (ft) (Step 6,7): 50 Feet  (Please See Therapy Evaluation for device and assistance level needed)    Treatment Interventions: ADL retraining, Functional transfer training, UE strengthening/ROM, Patient/Family training, Equipment eval/education, Compensatory technique education       Goal Formulation: Patient  Time For Goal Achievement: by time of discharge  ADL Goals  Patient will dress lower body: Modified Independent, Partly met(Stand by assist)  Pt will complete bathing: Modified Independent, Not met(Not addressed this session)  Patient will toilet: Modified Independent, Partly met(Stand by assist)  Mobility and Transfer Goals  Pt will perform bathtub transfer: Modified Independent, Partly met(Stand by assist with tub bench)     Musculoskeletal Goals  Pt will perform Home Exercise Program: independent, to increase engagement in ADLs(RUE strengthening HEP)

## 2018-09-06 NOTE — PT Progress Note (Signed)
[facility]  Physical Therapy Attempt Note    Patient:  [name] Cook MRN#:  [id number]  Unit:  [facility] Room/Bed:  J1914/N8295-A      Physical Therapy treatment attempted on 09/06/2018 at 3:48 PM unable to complete secondary to pt sleeping soundly. Pt does not awaken to VC. Will reattempt as time permits.   Dorann Ou, [place]  [id number]  09/06/2018  3:49 PM

## 2018-09-06 NOTE — Discharge Instructions (Signed)
Constipation (Adult)  Constipation means that you have bowel movements that are less frequent than usual. Stools often become very hard and difficult to pass.  Constipation is very common. At some point in life, it affects almost everyone. Since everyone's bowel habits are different, what is constipation to one person may not be to another. Your healthcare provider may do tests to diagnose constipation. It depends on whathe or shefinds when evaluating you.    Symptoms of constipation include:   Abdominal pain   Bloating   Vomiting   Painful bowel movements   Itching, swelling, bleeding, or pain around the anus  Causes  Constipation can have many causes. These include:   Diet low in fiber   Too much dairy   Not drinking enough liquids   Lack of exercise or physical activity (especially true for older adults)   Changes in lifestyle or daily routine, including pregnancy, aging, work, and travel   Frequent use or misuse of laxatives   Ignoring the urge to have a bowel movement or delaying it until later   Medicines, such as certain prescription pain medicines, iron supplements, antacids, certain antidepressants, and calcium supplements   Diseases like irritable bowel syndrome, bowel obstructions, stroke, diabetes, thyroid disease, Parkinson disease, hemorrhoids, and colon cancer  Complications  Potential complications of constipation can include:   Hemorrhoids   Rectal bleeding from hemorrhoids or anal fissures(skin tears)   Hernias   Dependency on laxatives   Chronic constipation   Fecal impaction, a severe form of constipation in which a large amount of hard stool is in your rectum that you can't pass   Bowel obstruction or perforation  Home care  All treatment should be done after talking with your healthcare provider. This is especially true if you have another medical problems, are taking prescription medicines, or are an older adult. Treatment most often involves lifestyle changes. You  may also need medicines. Your healthcare provider will tell you which will work best for you. Follow the advice below to help avoid this problem in the future.  Lifestyle changes  These lifestyle changes can help prevent constipation:   Diet. Eat a high-fiber diet, with fresh fruit and vegetables, and reduce dairy intake, meats, and processed foods   Fluids. It's important to get enough fluids each day. Drink plenty of water when you eat more fiber. If you are on diet that limits the amount of fluid you can have, talk about this with your healthcare provider.   Regular exercise. Check with your healthcare provider first.  Medicines  Take any medicines as directed. Some laxatives are safe to use only every now and then. Others can be taken on a regular basis. While laxatives don't cause bowel dependence, they are treating the symptoms. So your constipation may return if you don't make other changes. Talk with your healthcare provider or pharmacist if you have questions.  Prescription pain medicines can cause constipation. If you are taking this kind of medicine, ask your healthcare provider if you should also take a stool softener.  Medicines you may take to treat constipation include:   Fiber supplements   Stool softeners   Laxatives   Enemas   Rectal suppositories  Follow-up care  Follow up with your healthcare provider if symptoms don't get better in the next few days. You may need to have more tests or see a specialist.  Call 911  Call 911 if any of these occur:   Trouble breathing   Stiff, rigid   abdomen that is severely painful to touch   Confusion   Fainting or loss of consciousness   Rapid heart rate   Chest pain  When to seek medical advice  Call your healthcare provider right away if any of these occur:   Fever of 100.4F (38C) or higher, or as directed by your healthcare provider   Failure to resume normal bowel movements   Pain in your abdomen or back gets worse   Nausea or  vomiting   Swelling in your abdomen   Blood in the stool   Black, tarry stool   Involuntary weight loss   Weakness  [employer] last reviewed this educational content on 08/11/2016   2000-2020 [employer] [address], [place] [address]. All rights reserved. This information is not intended as a substitute for professional medical care. Always follow your healthcare professional's instructions.

## 2018-09-06 NOTE — Discharge Summary (Addendum)
[facility]      Patient: [name] Cook  Admission Date: 09/04/2018   DOB: 1975/04/30  Discharge Date: 09/06/2018    MRN: [id number]  Discharge Attending:Joyous Gleghorn   Referring Physician: Patsy Lager, MD  PCP: Patsy Lager, MD       DISCHARGE SUMMARY     Discharge Information   Admission Diagnosis:   Stroke-like symptoms    Discharge Diagnosis:   Active Hospital Problems    Diagnosis   . Abdominal pain   . Right hemiparesis   . Class 2 obesity in adult   . History of CVA (cerebrovascular accident)   . Facial droop   . Stroke-like symptoms   . Chest pain        Admission Condition: left facial drooping with left sided weakness   Discharge Condition: persistent   Consultants: neurology  Functional Status: ambulatory  Discharged to: home with supervision and DMEs- BSC, FWW, Bed Enabler     Discharge Medications:     Medication List      START taking these medications    senna-docusate 8.6-50 MG per tablet  Commonly known as:  PERICOLACE  Take 2 tablets by mouth nightly for 15 days     topiramate 25 MG tablet  Commonly known as:  TOPAMAX  Take 1 tablet (25 mg total) by mouth nightly For migraine        CONTINUE taking these medications    aspirin 81 MG chewable tablet  Chew 1 tablet (81 mg total) by mouth daily     atorvastatin 40 MG tablet  Commonly known as:  LIPITOR     KEPPRA PO     lisinopril 40 MG tablet  Commonly known as:  ZESTRIL           Where to Get Your Medications      These medications were sent to [id number] - [place], [place] - [address] [facility]  [address] [place], [place] [address]    Phone:  [phone]    senna-docusate 8.6-50 MG per tablet   topiramate 25 MG tablet              Hospital Course   Presentation History   Per H&P "[name] Cook is a 42 y.o. 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      Patient reports that she is having chest  pain for past 2 hours, located in middle of the chest, radiating to left arm, nonexertional, constant, stabbing-like chest pain, associated with palpitation, shortness of breath and sweating.  Patient is also complaining of right-sided facial droop started yesterday around 3 PM, associated with headache, right-sided weakness/numbness in arm/leg, right sided facial droop with right-sided facial numbness and double vision in her right eye.  Patient is also complaining of lower abdominal pain, associated with nausea, vomiting and 4 episodes of nonbloody watery bowel movements.  Patient states that she had a fall 2 days ago, unsure how it happened, also unsure if she hit her head or had seizure episode.  Patient denies dizziness, speech/swallow problems or change in urinary habit.    Patient reports that she takes aspirin at home.  Plavix was stopped 3 months ago by her doctor."    See HPI for details.    Hospital Course (0 Days)   Patient Active Hospital Problem List:   Stroke-like symptoms (08/19/2015) with right hemiparesis and right facial drooping with  prior history of CVA;     Assessment: MRI brain is normal; based on prior normal studies with similar presentation there is a possible psychogenic element; possible complicated migraine    Plan: Neurology inputs appreciated;  Cleared for discharge  Recommended to start topamax 25mg  qHS   Continue with aspirin 81 mg daily  Continue with Lipitor 40 mg daily     Chest pain (06/17/2015)    Assessment: In the epigastric area; troponins negative x2; EKG unremarkable; possible GERD symptoms vs pain due to constipation     Plan: treat constipation as below     Abdominal pain (09/05/2018)    Assessment: Unclear etiology for the abdominal pain; USG abd done; no ovarian torsion; possible constipation given stool load seen on the CT abdomen    Plan: Start senna with Colace  Start MiraLAX  No narcotics at discharge      Class 2 obesity in adult (05/13/2016)    Assessment: Chronic and  related to excess calories    Plan: Lifestyle changes discussed     Hyperlipidemia  Depression  Seizure  The above to be discussed with PMD    Code Status at Discharge: FULL       Best Practices   Was the patient admitted with either a CHF Exacerbation or Pneumonia? None     Progress Note/Physical Exam at Discharge     Subjective:  Reports feeling about the same as yesterday.     Vitals:    09/06/18 0800 09/06/18 0848 09/06/18 1203 09/06/18 1600   BP: 106/67 117/76 118/82 130/81   Pulse: 71  67 61   Resp: 18  18 18    Temp: 97.9 F (36.6 C)  97.9 F (36.6 C) 98.6 F (37 C)   TempSrc: Oral  Oral Oral   SpO2: 96%  95% 99%   Weight:       Height:           Physical Exam  Vitals signs and nursing note reviewed.   Constitutional:       General: She is not in acute distress.     Appearance: Normal appearance. She is normal weight. She is not ill-appearing, toxic-appearing or diaphoretic.   HENT:      Head: Normocephalic and atraumatic.      Right Ear: Tympanic membrane, ear canal and external ear normal.      Left Ear: Tympanic membrane, ear canal and external ear normal.      Nose: Nose normal. No congestion or rhinorrhea.      Mouth/Throat:      Mouth: Mucous membranes are moist.      Pharynx: Oropharynx is clear. No oropharyngeal exudate or posterior oropharyngeal erythema.   Eyes:      General: No scleral icterus.        Right eye: No discharge.         Left eye: No discharge.      Extraocular Movements: Extraocular movements intact.      Conjunctiva/sclera: Conjunctivae normal.      Pupils: Pupils are equal, round, and reactive to light.   Cardiovascular:      Rate and Rhythm: Normal rate and regular rhythm.      Pulses: Normal pulses.      Heart sounds: Normal heart sounds. No murmur. No friction rub. No gallop.    Pulmonary:      Effort: Pulmonary effort is normal. No respiratory distress.      Breath sounds: No stridor. No rhonchi or  rales.   Chest:      Chest wall: No tenderness.   Abdominal:      General:  Abdomen is flat. Bowel sounds are normal. There is no distension.      Palpations: Abdomen is soft. There is no mass.      Tenderness: There is abdominal tenderness (right lower quadrant tenderness ). There is no right CVA tenderness, left CVA tenderness, guarding or rebound.      Hernia: No hernia is present.   Genitourinary:     Rectum: Normal.   Musculoskeletal: Normal range of motion.         General: No swelling, tenderness, deformity or signs of injury.      Right lower leg: No edema.      Left lower leg: No edema.   Skin:     General: Skin is warm and dry.      Capillary Refill: Capillary refill takes less than 2 seconds.      Coloration: Skin is not jaundiced or pale.      Findings: No bruising, erythema, lesion or rash.   Neurological:      General: No focal deficit present.      Mental Status: She is alert and oriented to person, place, and time. Mental status is at baseline.      Cranial Nerves: Cranial nerve deficit (right facial drooping ) present.      Sensory: No sensory deficit.      Motor: Weakness (right arm and leg weakness 4/5) present.      Coordination: Coordination normal.      Gait: Gait normal.      Deep Tendon Reflexes: Reflexes normal.   Psychiatric:         Mood and Affect: Mood normal.         Behavior: Behavior normal.         Thought Content: Thought content normal.         Judgment: Judgment normal.                Diagnostics     Labs/Studies Pending at Discharge: No    Last Labs   Recent Labs   Lab 09/05/18  0348 09/04/18  2017   WBC 11.77* 14.32*   RBC 3.92 4.00   Hgb 8.6* 8.8*   Hematocrit 30.1* 30.4*   MCV 76.8* 76.0*   Platelets 199 215       Recent Labs   Lab 09/05/18  0348 09/04/18  2017   Sodium 141 140   Potassium 3.3* 3.4*   Chloride 109 107   CO2 23 25   BUN 9.0 10.0   Creatinine 0.9 0.9   Glucose 130* 93   Calcium 8.3* 8.9       Microbiology Results     None          Imaging:   Ct Abd/pelvis Without Contrast    Result Date: 09/04/2018  No nephrolithiasis or hydronephrosis.  Lorinda Creed, MD  09/04/2018 9:58 PM    Ct Head Without Contrast    Result Date: 09/04/2018    No acute intracranial abnormality Lorinda Creed, MD  09/04/2018 7:53 PM    Mri Brain Wo Contrast    Result Date: 09/05/2018   Motion artifact. No acute intracranial abnormalities. Georgana Curio, MD  09/05/2018 6:24 PM    Xr Chest  Ap Portable    Result Date: 09/04/2018  No acute findings. Carleene Overlie, MD  09/04/2018 9:01 PM    US  Transvaginal Non Ob With Ltd Doppler (ovarian Torsion)    Result Date: 09/04/2018  Diffusely heterogeneous myometrium without a discrete fibroid. No sonographic findings of ovarian torsion. Lorinda Creed, MD  09/04/2018 10:16 PM       Patient Instructions   Discharge Diet: high fiber diet    Discharge Activity: as tolerated    Follow Up Appointment:  Follow-up Information     Elnour, Roseanne Reno, MD. Schedule an appointment as soon as possible for a visit in 1 week(s).    Specialties:  Epilepsy, Neurology  Why:  Neurology Clinic   Contact information:  [address]  [place] [address]  [phone]             Troy Sine MD. Schedule an appointment as soon as possible for a visit in 1 week(s).    Why:  Primary Care Clinic                   Time spent examining patient, discussing with patient/family regarding hospital course, chart review, reconciling medications and discharge planning: 45 minutes.    Signed,  Teodora Medici MD, MPH  5:11 PM 09/06/2018   Please contact via PerfectServe  SpectraLink: [phone]         This chart was generated using hospital voice-recognition software which does not employ spell-checking or grammar-checking features. It was dictated, all or in part, in a busy and often noisy patient care environment. I have taken all usual measures to dictate carefully and to review all aspects this chart. Nonetheless, given the known and well-documented performance characteristics of VR software in such patient care environments, this dictation still may contain unrecognized and wholly  unintended errors or omissions

## 2018-09-06 NOTE — Plan of Care (Signed)
Problem: Safety  Goal: Patient will be free from injury during hospitalization  Outcome: Progressing  Flowsheets (Taken 09/05/2018 0526 by Su Monks, RN)  Patient will be free from injury during hospitalization:   Assess patient's risk for falls and implement fall prevention plan of care per policy   Provide and maintain safe environment   Use appropriate transfer methods   Ensure appropriate safety devices are available at the bedside   Include patient/ family/ care giver in decisions related to safety   Hourly rounding   Assess for patients risk for elopement and implement Elopement Risk Plan per policy   Provide alternative method of communication if needed (communication boards, writing)  Goal: Patient will be free from infection during hospitalization  Outcome: Progressing  Flowsheets (Taken 09/06/2018 0017)  Free from Infection during hospitalization:   Assess and monitor for signs and symptoms of infection   Monitor all insertion sites (i.e. indwelling lines, tubes, urinary catheters, and drains)   Encourage patient and family to use good hand hygiene technique   Monitor lab/diagnostic results     Problem: Pain  Goal: Pain at adequate level as identified by patient  Outcome: Progressing     Problem: Side Effects from Pain Analgesia  Goal: Patient will experience minimal side effects of analgesic therapy  Outcome: Progressing  Flowsheets (Taken 09/06/2018 0017)  Patient will experience minimal side effects of analgesic therapy:   Monitor/assess patient's respiratory status (RR depth, effort, breath sounds)   Assess for changes in cognitive function   Prevent/manage side effects per LIP orders (i.e. nausea, vomiting, pruritus, constipation, urinary retention, etc.)   Evaluate for opioid-induced sedation with appropriate assessment tool (i.e. POSS)     Problem: Discharge Barriers  Goal: Patient will be discharged home or other facility with appropriate resources  Outcome: Progressing  Flowsheets (Taken  09/06/2018 0017)  Discharge to home or other facility with appropriate resources:   Provide appropriate patient education   Initiate discharge planning   Provide information on available health resources     Problem: Psychosocial and Spiritual Needs  Goal: Demonstrates ability to cope with hospitalization/illness  Outcome: Progressing  Flowsheets (Taken 09/05/2018 0526 by Su Monks, RN)  Demonstrates ability to cope with hospitalizations/illness:   Encourage verbalization of feelings/concerns/expectations   Provide quiet environment   Encourage patient to set small goals for self   Assist patient to identify own strengths and abilities   Encourage participation in diversional activity   Reinforce positive adaptation of new coping behaviors   Include patient/ patient care companion in decisions   Communicate referral to spiritual care as appropriate     Problem: Moderate/High Fall Risk Score >5  Goal: Patient will remain free of falls  Outcome: Progressing     Problem: Day of Admission - Stroke  Goal: Core/Quality measure requirements - Admission  Outcome: Progressing  Flowsheets (Taken 09/05/2018 0526 by Su Monks, RN)  Core/Quality measure requirements - Admission:   Document NIH Stroke Scale on admission   Document nursing swallow/dysphagia screen on admission. If patient fails, keep patient NPO (follow your hospital protocol on swallowing screening).   VTE Prevention: Ensure anticoagulant(s) administered and/or anti-embolism stockings/devices documented as ordered   If diagnosis or history of Atrial Fib/Atrial Flutter, ensure oral anticoagulation is initiated or contraindication documented by LIP   Ensure lipid panel ordered   Ensure antithrombotic administered or contraindication documented by LIP   Ensure PT/OT and/or SLP ordered     Problem: Every Day - Stroke  Goal: Core/Quality measure requirements - Daily  Outcome: Progressing  Goal: Neurological status is stable or improving  Outcome: Progressing  Goal:  Stable vital signs and fluid balance  Outcome: Progressing  Goal: Patient will maintain adequate oxygenation  Outcome: Progressing  Goal: Patient's risk of aspiration will be minimized  Outcome: Progressing  Goal: Nutritional intake is adequate  Outcome: Progressing  Goal: Elimination patterns are normal or improving  Outcome: Progressing  Goal: Mobility/Activity is maintained at optimal level for patient  Outcome: Progressing  Goal: Skin integrity is maintained or improved  Outcome: Progressing  Goal: Neurovascular status is stable or improving  Outcome: Progressing  Goal: Effective coping demonstrated  Outcome: Progressing  Goal: Will be able to express needs and understand communication  Outcome: Progressing  Flowsheets (Taken 09/06/2018 0017)  Able to express needs and understand communication:   Consult/collaborate with Speech Language Pathology (SLP)   Consult/collaborate with Case Management/Social Work   Provide alternative method of communication if needed   Include patient care companion in decisions related to communication   Patient/patient care companion demonstrates understanding on disease process, treatment plan, medications and discharge plan

## 2018-09-06 NOTE — Progress Notes (Signed)
Progress Note    Date Time: 09/06/18 10:06 AM  Patient Name: [name] Cook  Attending Physician: Teodora Medici, MD      Assessment & Plan:   Recurrent L sided weakness and right vs left facial weakness.    Pt feels about the same.    MRI brain - negative.    -  ASA given Hx of CVA in the past.  -  Possible migraine event - pt does report headaches associated with her worsening.  -  Start Topamax 25mg  QHS  -  F/u in our office if insurance will allow - otherwise follow up in office of neurologist in [place] who takes her insurance.    Subjective:   Patient Seen and Examined. The notes from the last 24 hours were reviewed.   Pt with no new complaints.  No headache currently    Review of Systems:   No headache, eye, ear nose, throat problems; no coughing or wheezing or shortness of breath, No chest pain or orthopnea, no abdominal pain, nausea or vomiting, No pain in the body or extremities, no psychiatric, neurological, endocrine, hematological or cardiac complaints except as noted above.     Physical Exam:   Blood pressure 117/76, pulse 71, temperature 97.9 F (36.6 C), temperature source Oral, resp. rate 18, height 1.778 m (5\' 10" ), weight 114.8 kg (253 lb), last menstrual period 08/16/2018, SpO2 96 %.    Pt is alert though responds poorly - but eventually answering questions.      Meds:      Scheduled Meds: PRN Meds:    alum & mag hydroxide-simethicone, 15 mL, Oral, Q4H WA  aspirin, 81 mg, Oral, Daily  atorvastatin, 40 mg, Oral, QHS  cefTRIAXone, 1 g, Intravenous, Q24H  enoxaparin, 40 mg, Subcutaneous, Daily  famotidine, 20 mg, Oral, Q12H SCH    Or  famotidine, 20 mg, Intravenous, Q12H SCH  levETIRAcetam, 500 mg, Oral, BID  lidocaine, 1 patch, Transdermal, Daily  lisinopril, 40 mg, Oral, Daily  polyethylene glycol, 17 g, Oral, Daily  senna-docusate, 2 tablet, Oral, QHS        Continuous Infusions:   dicyclomine, 10 mg, 4X Daily PRN  morphine, 15 mg, Q4H PRN  morphine, 1 mg, Q4H PRN  naloxone, 0.2 mg,  PRN  nitroglycerin, 0.4 mg, Q5 Min PRN  ondansetron, 4 mg, Q6H PRN    Or  ondansetron, 4 mg, Q6H PRN            I personally reviewed all of the medications    Labs:     Recent Labs   Lab 09/05/18  0348 09/04/18  2017   Glucose 130* 93   BUN 9.0 10.0   Creatinine 0.9 0.9   Calcium 8.3* 8.9   Sodium 141 140   Potassium 3.3* 3.4*   Chloride 109 107   CO2 23 25   Albumin  --  3.7   AST (SGOT)  --  15   ALT  --  15   Bilirubin, Total  --  0.6   Alkaline Phosphatase  --  70     Recent Labs   Lab 09/05/18  0348 09/04/18  2017   WBC 11.77* 14.32*   Hgb 8.6* 8.8*   Hematocrit 30.1* 30.4*   MCV 76.8* 76.0*   MCH 21.9* 22.0*   MCHC 28.6* 28.9*   Platelets 199 215         No results for input(s): PTT, PT, INR in the last 72 hours.  Radiology Results (24 Hour)     Procedure Component Value Units Date/Time    MRI Brain WO Contrast [[id number]] Collected:  09/05/18 1822    Order Status:  Completed Updated:  09/05/18 1826    Narrative:       HISTORY:  Weakness. CVA.    TECHNIQUE: Nonenhanced MRI of the brain was performed utilizing the  following pulse sequences:  sagittal and axial T1-weighted , T2-weighted  axial, FLAIR axial, axial gradients, and diffusion weighted axial  images.     PRIORS: 04/16/2017.    FINDINGS:   Parts of the study are degraded by motion artifact.   The ventricular system is normal in size and contour.   There is no acute intracranial hemorrhage. There is no herniation.   There are no extra-axial fluid collections.   There are no concerning areas of abnormal signal intensity.   The diffusion sequences show no restriction to suggest an acute infarct.    The cerebellar tonsils are normally positioned.      Impression:        Motion artifact. No acute intracranial abnormalities.    Georgana Curio, MD   09/05/2018 6:24 PM           All recent brain and spine imaging (MRI, CT) results reviewed.    Code status listed in chart confirmed    Chart reviewed    Case discussed with: pt, nurse, and Dr. Gaynelle Adu    35  minutes; >50% time spent in counseling or coordination of care    Signed by: Ardelle Anton, MD  Spectralink: [phone]       Answering Service: [phone]

## 2018-09-06 NOTE — Plan of Care (Signed)
Problem: Safety  Goal: Patient will be free from injury during hospitalization  Outcome: Progressing  Flowsheets (Taken 09/06/2018 1041)  Patient will be free from injury during hospitalization:   Assess patient's risk for falls and implement fall prevention plan of care per policy   Use appropriate transfer methods   Provide and maintain safe environment   Ensure appropriate safety devices are available at the bedside   Hourly rounding   Include patient/ family/ care giver in decisions related to safety   Assess for patients risk for elopement and implement Elopement Risk Plan per policy   Provide alternative method of communication if needed (communication boards, writing)     Problem: Pain  Goal: Pain at adequate level as identified by patient  Outcome: Progressing  Flowsheets (Taken 09/06/2018 1041)  Pain at adequate level as identified by patient:   Identify patient comfort function goal   Assess for risk of opioid induced respiratory depression, including snoring/sleep apnea. Alert healthcare team of risk factors identified.   Assess pain on admission, during daily assessment and/or before any "as needed" intervention(s)   Evaluate patient's satisfaction with pain management progress   Offer non-pharmacological pain management interventions   Consult/collaborate with Pain Service   Evaluate if patient comfort function goal is met   Consult/collaborate with Physical Therapy, Occupational Therapy, and/or Speech Therapy     Problem: Side Effects from Pain Analgesia  Goal: Patient will experience minimal side effects of analgesic therapy  Outcome: Progressing  Flowsheets (Taken 09/06/2018 1041)  Patient will experience minimal side effects of analgesic therapy:   Monitor/assess patient's respiratory status (RR depth, effort, breath sounds)   Assess for changes in cognitive function   Prevent/manage side effects per LIP orders (i.e. nausea, vomiting, pruritus, constipation, urinary retention, etc.)   Evaluate for  opioid-induced sedation with appropriate assessment tool (i.e. POSS)     Problem: Every Day - Stroke  Goal: Core/Quality measure requirements - Daily  Outcome: Progressing  Flowsheets (Taken 09/06/2018 1041)  Core/Quality measure requirements - Daily:   VTE Prevention: Ensure anticoagulant(s) administered and/or anti-embolism stockings/devices documented by end of day 2   Ensure antithrombotic administered or contraindication documented by LIP by end of day 2   Once lipid panel has resulted, check LDL. Contact provider for statin order if LDL > 70 (or ensure contraindication documented by LIP).   Continue stroke education (must include Modifiable Risk Factors, Warning Signs and Symptoms of Stroke, Activation of Emergency Medical System and Follow-up Appointments). Ensure handout has been given and documented.  Goal: Neurological status is stable or improving  Outcome: Progressing  Flowsheets (Taken 09/06/2018 1041)  Neurological status is stable or improving:   Monitor/assess/document neurological assessment (Stroke: every 4 hours)   Monitor/assess NIH Stroke Scale   Perform CAM Assessment   Observe for seizure activity and initiate seizure precautions if indicated  Goal: Stable vital signs and fluid balance  Outcome: Progressing  Flowsheets (Taken 09/06/2018 1041)  Stable vital signs and fluid balance:   Position patient for maximum circulation/cardiac output   Monitor and assess vitals every 4 hours or as ordered and hemodynamic parameters   Encourage oral fluid intake   Apply telemetry monitor as ordered   Monitor intake and output. Notify LIP if urine output is < 30 mL/hour.  Goal: Neurovascular status is stable or improving  Outcome: Progressing  Flowsheets (Taken 09/06/2018 1041)  Neurovascular status is stable or improving:   Monitor/assess neurovascular status (pulses, capillary refill, pain, paresthesia, presence of edema)  Monitor/assess for signs of Venous Thrombus Emboli (edema of calf/thigh redness,  pain)   Monitor/assess site of invasive procedure for signs of bleeding     Patient alert and awake. Right side facial droop and RUE/LLE weakness   Noted. Patient still complaining of abdominal pain, abdomen is rounded and nontender. On room air. Waiting for possible discharge. Will continue to monitor.

## 2018-09-10 NOTE — UM Notes (Signed)
Requesting final auth to be faxed to [phone] and additional information can be requested by calling   [phone]     Auth Number : N/A     Discharge Date -  09/06/2018  6:16 PM     ER ADMIT DATE AND TIME: 09/04/2018  7:48 PM  OBS admit: 6.24.2020 @ 2218       NAME: [name] Cook             MR#: [id number]        Patient Address:  [address]   [place] [address]     Patient phone: [phone] (home)      PATIENT NAME: [name] Cook, [name] Cook  DOB: 03/02/1976   PMH:  has a past medical history of Hypercholesteremia, Hypertension, Seizures, Stroke, and TIA (transient ischemic attack) (2017).  PSH:  has a past surgical history that includes Appendectomy; Cholecystectomy; and EGD, BIOPSY (N/A, 04/16/2017).        DIAGNOSIS:     ICD-10-CM    1. Facial droop R29.810    2. Weakness R53.1    3. Hypokalemia E87.6

## 2018-10-29 ENCOUNTER — Emergency Department: Payer: No Typology Code available for payment source

## 2018-10-29 ENCOUNTER — Observation Stay
Admission: EM | Admit: 2018-10-29 | Discharge: 2018-10-30 | Disposition: A | Discharge status: Home / Self Care | Payer: No Typology Code available for payment source | Attending: Internal Medicine | Admitting: Internal Medicine

## 2018-10-29 ENCOUNTER — Observation Stay: Payer: No Typology Code available for payment source

## 2018-10-29 DIAGNOSIS — R29810 Facial weakness: Secondary | ICD-10-CM | POA: Insufficient documentation

## 2018-10-29 DIAGNOSIS — Z7982 Long term (current) use of aspirin: Secondary | ICD-10-CM | POA: Insufficient documentation

## 2018-10-29 DIAGNOSIS — I10 Essential (primary) hypertension: Secondary | ICD-10-CM | POA: Insufficient documentation

## 2018-10-29 DIAGNOSIS — R531 Weakness: Secondary | ICD-10-CM

## 2018-10-29 DIAGNOSIS — Z6837 Body mass index (BMI) 37.0-37.9, adult: Secondary | ICD-10-CM | POA: Insufficient documentation

## 2018-10-29 DIAGNOSIS — E669 Obesity, unspecified: Secondary | ICD-10-CM | POA: Insufficient documentation

## 2018-10-29 DIAGNOSIS — E785 Hyperlipidemia, unspecified: Secondary | ICD-10-CM | POA: Insufficient documentation

## 2018-10-29 DIAGNOSIS — Z8673 Personal history of transient ischemic attack (TIA), and cerebral infarction without residual deficits: Secondary | ICD-10-CM | POA: Insufficient documentation

## 2018-10-29 DIAGNOSIS — G40909 Epilepsy, unspecified, not intractable, without status epilepticus: Secondary | ICD-10-CM | POA: Insufficient documentation

## 2018-10-29 DIAGNOSIS — R253 Fasciculation: Principal | ICD-10-CM | POA: Insufficient documentation

## 2018-10-29 DIAGNOSIS — R072 Precordial pain: Secondary | ICD-10-CM

## 2018-10-29 DIAGNOSIS — G43909 Migraine, unspecified, not intractable, without status migrainosus: Secondary | ICD-10-CM | POA: Insufficient documentation

## 2018-10-29 DIAGNOSIS — R079 Chest pain, unspecified: Secondary | ICD-10-CM

## 2018-10-29 DIAGNOSIS — H5461 Unqualified visual loss, right eye, normal vision left eye: Secondary | ICD-10-CM | POA: Insufficient documentation

## 2018-10-29 DIAGNOSIS — E7800 Pure hypercholesterolemia, unspecified: Secondary | ICD-10-CM | POA: Insufficient documentation

## 2018-10-29 LAB — COMPREHENSIVE METABOLIC PANEL
ALT: 17 U/L (ref 0–55)
AST (SGOT): 16 U/L (ref 5–34)
Albumin/Globulin Ratio: 1 (ref 0.9–2.2)
Albumin: 4 g/dL (ref 3.5–5.0)
Alkaline Phosphatase: 98 U/L (ref 37–106)
Anion Gap: 8 (ref 5.0–15.0)
BUN: 9 mg/dL (ref 7.0–19.0)
Bilirubin, Total: 0.8 mg/dL (ref 0.2–1.2)
CO2: 25 mEq/L (ref 22–29)
Calcium: 8.7 mg/dL (ref 8.5–10.5)
Chloride: 106 mEq/L (ref 100–111)
Creatinine: 0.8 mg/dL (ref 0.6–1.0)
Globulin: 4.2 g/dL — ABNORMAL HIGH (ref 2.0–3.6)
Glucose: 87 mg/dL (ref 70–100)
Potassium: 3.6 mEq/L (ref 3.5–5.1)
Protein, Total: 8.2 g/dL (ref 6.0–8.3)
Sodium: 139 mEq/L (ref 136–145)

## 2018-10-29 LAB — CBC AND DIFFERENTIAL
Absolute NRBC: 0 10*3/uL (ref 0.00–0.00)
Basophils Absolute Automated: 0.07 10*3/uL (ref 0.00–0.08)
Basophils Automated: 0.7 %
Eosinophils Absolute Automated: 0.19 10*3/uL (ref 0.00–0.44)
Eosinophils Automated: 1.9 %
Hematocrit: 33 % — ABNORMAL LOW (ref 34.7–43.7)
Hgb: 9.5 g/dL — ABNORMAL LOW (ref 11.4–14.8)
Immature Granulocytes Absolute: 0.03 10*3/uL (ref 0.00–0.07)
Immature Granulocytes: 0.3 %
Lymphocytes Absolute Automated: 2.43 10*3/uL (ref 0.42–3.22)
Lymphocytes Automated: 24.4 %
MCH: 22.5 pg — ABNORMAL LOW (ref 25.1–33.5)
MCHC: 28.8 g/dL — ABNORMAL LOW (ref 31.5–35.8)
MCV: 78.2 fL (ref 78.0–96.0)
MPV: 12.3 fL (ref 8.9–12.5)
Monocytes Absolute Automated: 0.57 10*3/uL (ref 0.21–0.85)
Monocytes: 5.7 %
Neutrophils Absolute: 6.66 10*3/uL — ABNORMAL HIGH (ref 1.10–6.33)
Neutrophils: 67 %
Nucleated RBC: 0 /100 WBC (ref 0.0–0.0)
Platelets: 208 10*3/uL (ref 142–346)
RBC: 4.22 10*6/uL (ref 3.90–5.10)
RDW: 18 % — ABNORMAL HIGH (ref 11–15)
WBC: 9.95 10*3/uL — ABNORMAL HIGH (ref 3.10–9.50)

## 2018-10-29 LAB — TROPONIN I
Troponin I: <0.01 ng/mL (ref 0.00–0.05)
Troponin I: <0.01 ng/mL (ref 0.00–0.05)

## 2018-10-29 LAB — GFR: EGFR: >60

## 2018-10-29 LAB — HEMOLYSIS INDEX
Hemolysis Index: 3 (ref 0–18)
Hemolysis Index: 4 (ref 0–18)

## 2018-10-29 LAB — CK: Creatine Kinase (CK): 168 U/L (ref 29–168)

## 2018-10-29 MED ORDER — HYDRALAZINE HCL 20 MG/ML IJ SOLN
5.00 mg | Freq: Four times a day (QID) | INTRAMUSCULAR | Status: DC | PRN
Start: 2018-10-29 — End: 2018-10-30

## 2018-10-29 MED ORDER — LORAZEPAM 2 MG/ML IJ SOLN
1.00 mg | Freq: Once | INTRAMUSCULAR | Status: AC
Start: 2018-10-29 — End: 2018-10-29

## 2018-10-29 MED ORDER — ASPIRIN 81 MG PO CHEW
162.00 mg | CHEWABLE_TABLET | Freq: Once | ORAL | Status: DC
Start: 2018-10-29 — End: 2018-10-29
  Filled 2018-10-29: qty 2

## 2018-10-29 MED ORDER — OXYCODONE HCL 5 MG PO TABS
5.0000 mg | ORAL_TABLET | Freq: Once | ORAL | Status: DC
Start: 2018-10-30 — End: 2018-10-29

## 2018-10-29 MED ORDER — ONDANSETRON HCL 4 MG/2ML IJ SOLN
4.00 mg | Freq: Once | INTRAMUSCULAR | Status: AC
Start: 2018-10-29 — End: 2018-10-29
  Administered 2018-10-29: 21:00:00 4 mg via INTRAVENOUS
  Filled 2018-10-29: qty 2

## 2018-10-29 MED ORDER — ASPIRIN 81 MG PO CHEW
81.00 mg | CHEWABLE_TABLET | Freq: Every day | ORAL | Status: DC
Start: 2018-10-29 — End: 2018-10-30
  Administered 2018-10-30: 10:00:00 81 mg via ORAL
  Filled 2018-10-29: qty 1

## 2018-10-29 MED ORDER — ASPIRIN 81 MG PO CHEW
81.00 mg | CHEWABLE_TABLET | Freq: Every day | ORAL | Status: DC
Start: 2018-10-30 — End: 2018-10-29

## 2018-10-29 MED ORDER — ONDANSETRON HCL 4 MG/2ML IJ SOLN
4.00 mg | Freq: Once | INTRAMUSCULAR | Status: DC | PRN
Start: 2018-10-29 — End: 2018-10-30

## 2018-10-29 MED ORDER — ENOXAPARIN SODIUM 40 MG/0.4ML SC SOLN
40.00 mg | Freq: Every day | SUBCUTANEOUS | Status: DC
Start: 2018-10-30 — End: 2018-10-30
  Administered 2018-10-30: 10:00:00 40 mg via SUBCUTANEOUS
  Filled 2018-10-29: qty 0.4

## 2018-10-29 MED ORDER — LISINOPRIL 10 MG PO TABS
40.0000 mg | ORAL_TABLET | Freq: Every day | ORAL | Status: DC
Start: 2018-10-29 — End: 2018-10-30
  Administered 2018-10-30: 10:00:00 40 mg via ORAL
  Filled 2018-10-29: qty 4

## 2018-10-29 MED ORDER — LEVETIRACETAM 500 MG/5ML IV SOLN
750.00 mg | Freq: Two times a day (BID) | INTRAVENOUS | Status: DC
Start: 2018-10-29 — End: 2018-10-30
  Administered 2018-10-30 (×2): 750 mg via INTRAVENOUS
  Filled 2018-10-29 (×3): qty 7.5

## 2018-10-29 MED ORDER — LORAZEPAM 2 MG/ML IJ SOLN
INTRAMUSCULAR | Status: AC
Start: 2018-10-29 — End: 2018-10-29
  Administered 2018-10-29: 22:00:00 1 mg via INTRAVENOUS
  Filled 2018-10-29: qty 1

## 2018-10-29 MED ORDER — ATORVASTATIN CALCIUM 40 MG PO TABS
40.0000 mg | ORAL_TABLET | Freq: Every evening | ORAL | Status: DC
Start: 2018-10-29 — End: 2018-10-30

## 2018-10-29 MED ORDER — MORPHINE SULFATE 2 MG/ML IJ/IV SOLN (WRAP)
2.0000 mg | Freq: Once | Status: AC
Start: 2018-10-29 — End: 2018-10-29
  Administered 2018-10-29: 21:00:00 2 mg via INTRAVENOUS
  Filled 2018-10-29: qty 1

## 2018-10-29 NOTE — ED Notes (Signed)
Bed: BL21  Expected date:   Expected time:   Means of arrival:   Comments:  CN

## 2018-10-29 NOTE — ED Triage Notes (Signed)
[name] Cook is a 43 y.o. female, repoprts right facial droop[ started 4 days ago. Developed chest pain at 6;30 pm while watching TV. History of stroke on 2015 without deficit. Pain scale of 7/10. Breathing is  Even and unlabored.

## 2018-10-29 NOTE — ED Notes (Signed)
MRI called RN and stated MRI screening form is complete. RN to transfer to MRI.

## 2018-10-29 NOTE — ED Notes (Signed)
[facility] ED NURSING NOTE FOR THE RECEIVING INPATIENT NURSE   ED [place], RN   [facility] [phone]   ED CHARGE RN [phone]   ADMISSION INFORMATION   [name] Cook is a 43 y.o. female admitted with a diagnosis of:    1. Weakness    2. Chest pain, unspecified type         Isolation: None   Allergies: Contrast [iodinated diagnostic agents]; Fioricet [butalbital-apap-caffeine]; Fentanyl; Iodine; Motrin [ibuprofen]; Percocet [oxycodone-acetaminophen]; Shellfish-derived products; Toradol [ketorolac tromethamine]; Tramadol; and Tylenol [acetaminophen]   Holding Orders confirmed? Yes   Belongings Documented? Yes   Home medications sent to pharmacy confirmed? No   NURSING CARE   Mental Status: alert and oriented   ADL: Needs assistance with ADLs   Ambulation: household ambulator only     Engineer, maintenance (PPE) (F2)     Gloves, Shoe Covers, Surgical / Bouffant Cap and Surgical Mask    Pertinent Information  and Safety Concerns: Pt is A&Ox4. Has not ambulated in ER. Needed SONO for IV     ED Medications: See below, ED ISHAPED (P) Plan for medications administered in the ED   CT / NIH   CT Head ordered on this patient?  Yes   NIH/Dysphagia assessment done prior to admission? Yes    VITAL SIGNS   Time BP Temp Pulse Resp SpO2   2100 162/88 98.2 67 18 98%   IV LINES   IV Catheter Size: 20 g  Peripheral IV 10/29/18 20 G Left Forearm (Active)   Site Assessment Clean;Dry;Intact 10/29/2018  7:39 PM   Line Status Saline Locked;Flushed 10/29/2018  7:39 PM   Dressing Status Clean;Dry;Intact 10/29/2018  7:39 PM   Number of days: 0        LAB RESULTS   Labs Reviewed   CBC AND DIFFERENTIAL - Abnormal; Notable for the following components:       Result Value    WBC 9.95 (*)     Hgb 9.5 (*)     Hematocrit 33.0 (*)     MCH 22.5 (*)     MCHC 28.8 (*)     RDW 18 (*)     Neutrophils Absolute 6.66 (*)     All other components within normal limits   COMPREHENSIVE METABOLIC PANEL - Abnormal; Notable for the following components:    Globulin 4.2  (*)     All other components within normal limits   TROPONIN I   HEMOLYSIS INDEX   GFR
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
## 2018-10-29 NOTE — ED Provider Notes (Signed)
EMERGENCY DEPARTMENT HISTORY AND PHYSICAL EXAM     None        Date: 10/29/2018  Patient Name: [name] Cook    History of Presenting Illness and Plan     Chief Complaint   Patient presents with   . right facial droop   . Chest Pain       History Provided By: Patient  Chief Complaint: facial droop  Duration: yesterday 11 pm   Timing:  gradual  Location: R sided  Quality: n/a  Severity: moderate     Associated symptoms and pertinent negative listed in ROS.      PCP: Pcp, [place], MD  SPECIALISTS:    Current Facility-Administered Medications   Medication Dose Route Frequency Provider Last Rate Last Dose   . aspirin chewable tablet 81 mg  81 mg Oral Daily Marya Amsler, MD       . atorvastatin (LIPITOR) tablet 40 mg  40 mg Oral QHS Marya Amsler, MD       . enoxaparin (LOVENOX) syringe 40 mg  40 mg Subcutaneous Daily Jorene Guest, MD       . hydrALAZINE (APRESOLINE) injection 5 mg  5 mg Intravenous Q6H PRN Marya Amsler, MD       . levETIRAcetam (KEPPRA) 750 mg in sodium chloride 0.9 % 100 mL IVPB  750 mg Intravenous Q12H Marya Amsler, MD 400 mL/hr at 10/30/18 0039 750 mg at 10/30/18 0039   . lisinopril (ZESTRIL) tablet 40 mg  40 mg Oral Daily Jabbar, [place], MD       . ondansetron (ZOFRAN) injection 4 mg  4 mg Intravenous Once PRN Suzy Bouchard, MD           Past History     Past Medical History:  Past Medical History:   Diagnosis Date   . Hypercholesteremia    . Hypertension    . Seizures    . Stroke     2015 and 2017, right side   . TIA (transient ischemic attack) 2017       Past Surgical History:  Past Surgical History:   Procedure Laterality Date   . APPENDECTOMY     . CHOLECYSTECTOMY     . EGD, BIOPSY N/A 04/16/2017    Procedure: EGD, BIOPSY;  Surgeon: Pershing Proud, MD;  Location: [facility];  Service: Gastroenterology;  Laterality: N/A;       Family History:  Family History   Problem Relation Age of Onset   . Myocardial Infarction Father 51   . Deep vein thrombosis Father        Social History:  Social  History     Tobacco Use   . Smoking status: Never Smoker   . Smokeless tobacco: Never Used   Substance Use Topics   . Alcohol use: No   . Drug use: No       Allergies:  Allergies   Allergen Reactions   . Contrast [Iodinated Diagnostic Agents] Anaphylaxis     Patient woke up in ICU after having CT with contrast, last remembers being in CT   . Fioricet [Butalbital-Apap-Caffeine] Hives   . Fentanyl    . Iodine    . Motrin [Ibuprofen] Swelling   . Percocet [Oxycodone-Acetaminophen]    . Shellfish-Derived Products    . Toradol [Ketorolac Tromethamine]    . Tramadol    . Tylenol [Acetaminophen] Hives       Review of Systems     Review of Systems   Constitutional: Negative for  fever.   HENT: Negative for congestion.    Eyes: Positive for visual disturbance. Negative for pain.   Respiratory: Negative for shortness of breath.    Cardiovascular: Positive for chest pain.   Gastrointestinal: Negative for abdominal pain, diarrhea, nausea and vomiting.   Musculoskeletal: Negative for back pain.   Skin: Negative for rash.   Neurological: Positive for facial asymmetry and weakness. Negative for syncope.   Psychiatric/Behavioral: Negative for agitation.       Physical Exam   BP 132/85   Pulse 88   Temp 97.9 F (36.6 C) (Oral)   Resp 18   Ht 5\' 10"  (1.778 m)   Wt 142 kg   LMP 10/15/2018 (Exact Date)   SpO2 97%   BMI 44.93 kg/m     Physical Exam  Vitals signs and nursing note reviewed.   Constitutional:       Appearance: She is well-developed.   HENT:      Head: Normocephalic.   Eyes:      Conjunctiva/sclera: Conjunctivae normal.   Neck:      Musculoskeletal: Neck supple.   Cardiovascular:      Rate and Rhythm: Normal rate and regular rhythm.      Pulses:           Radial pulses are 2+ on the right side and 2+ on the left side.   Pulmonary:      Effort: Pulmonary effort is normal. No respiratory distress.      Breath sounds: Normal breath sounds. No wheezing.   Abdominal:      Palpations: Abdomen is soft.      Tenderness:  There is no abdominal tenderness.   Musculoskeletal:         General: No deformity.   Skin:     General: Skin is warm.   Neurological:      Mental Status: She is alert.      Comments: CN II-XII intact aside from L lower face drooping (forehead sparing), finger to nose normal, RUE/RLE drift with decreases sensation of R side, NIH score of 6           Diagnostic Study Results     Labs -     Results     Procedure Component Value Units Date/Time    Troponin I [[id number]] Collected:  10/30/18 0113    Specimen:  Blood Updated:  10/30/18 0207     Troponin I <0.01 ng/mL     Basic Metabolic Panel [[id number]]  (Abnormal) Collected:  10/30/18 0113    Specimen:  Blood Updated:  10/30/18 0203     Glucose 111 mg/dL      BUN 78.2 mg/dL      Creatinine 0.8 mg/dL      Calcium 8.1 mg/dL      Sodium 956 mEq/L      Potassium 3.6 mEq/L      Chloride 108 mEq/L      CO2 24 mEq/L      Anion Gap 7.0    Hemolysis index [[id number]] Collected:  10/30/18 0113     Updated:  10/30/18 0203     Hemolysis Index 10    GFR [[id number]] Collected:  10/30/18 0113     Updated:  10/30/18 0203     EGFR >60.0    CBC without differential [[id number]]  (Abnormal) Collected:  10/30/18 0113    Specimen:  Blood Updated:  10/30/18 0128     WBC 10.27 x10 3/uL      Hgb 8.6  g/dL      Hematocrit 09.8 %      Platelets 182 x10 3/uL      RBC 3.79 x10 6/uL      MCV 78.1 fL      MCH 22.7 pg      MCHC 29.1 g/dL      RDW 18 %      MPV 12.4 fL      Nucleated RBC 0.0 /100 WBC      Absolute NRBC 0.00 x10 3/uL     Troponin I [[id number]] Collected:  10/29/18 1937    Specimen:  Blood Updated:  10/29/18 2228     Troponin I <0.01 ng/mL     Creatine Kinase (CK) [[id number]] Collected:  10/29/18 1937    Specimen:  Blood Updated:  10/29/18 2222     Creatine Kinase (CK) 168 U/L     Hemolysis index [[id number]] Collected:  10/29/18 1937     Updated:  10/29/18 2222     Hemolysis Index 4    Troponin I [[id number]] Collected:  10/29/18 1937    Specimen:  Blood Updated:  10/29/18 2007      Troponin I <0.01 ng/mL     GFR [[id number]] Collected:  10/29/18 1937     Updated:  10/29/18 2000     EGFR >60.0    Comprehensive metabolic panel [[id number]]  (Abnormal) Collected:  10/29/18 1937    Specimen:  Blood Updated:  10/29/18 2000     Glucose 87 mg/dL      BUN 9.0 mg/dL      Creatinine 0.8 mg/dL      Sodium 956 mEq/L      Potassium 3.6 mEq/L      Chloride 106 mEq/L      CO2 25 mEq/L      Calcium 8.7 mg/dL      Protein, Total 8.2 g/dL      Albumin 4.0 g/dL      AST (SGOT) 16 U/L      ALT 17 U/L      Alkaline Phosphatase 98 U/L      Bilirubin, Total 0.8 mg/dL      Globulin 4.2 g/dL      Albumin/Globulin Ratio 1.0     Anion Gap 8.0    Hemolysis index [[id number]] Collected:  10/29/18 1937     Updated:  10/29/18 2000     Hemolysis Index 3    CBC and differential [[id number]]  (Abnormal) Collected:  10/29/18 1937    Specimen:  Blood Updated:  10/29/18 1945     WBC 9.95 x10 3/uL      Hgb 9.5 g/dL      Hematocrit 06.3 %      Platelets 208 x10 3/uL      RBC 4.22 x10 6/uL      MCV 78.2 fL      MCH 22.5 pg      MCHC 28.8 g/dL      RDW 18 %      MPV 12.3 fL      Neutrophils 67.0 %      Lymphocytes Automated 24.4 %      Monocytes 5.7 %      Eosinophils Automated 1.9 %      Basophils Automated 0.7 %      Immature Granulocytes 0.3 %      Nucleated RBC 0.0 /100 WBC      Neutrophils Absolute 6.66 x10 3/uL      Lymphocytes Absolute Automated 2.43 x10 3/uL  Monocytes Absolute Automated 0.57 x10 3/uL      Eosinophils Absolute Automated 0.19 x10 3/uL      Basophils Absolute Automated 0.07 x10 3/uL      Immature Granulocytes Absolute 0.03 x10 3/uL      Absolute NRBC 0.00 x10 3/uL           Radiologic Studies -   Radiology Results (24 Hour)     Procedure Component Value Units Date/Time    XR Chest  AP Portable [[id number]] Collected:  10/29/18 2045    Order Status:  Completed Updated:  10/29/18 2049    Narrative:       1 view chest radiograph    Reason for exam: cp    Comparison: 09/04/2018    Comments: Frontal view of the chest  was obtained. Normal heart size and  pulmonary vasculature. No focal consolidation, pleural effusion or  pneumothorax. No acute skeletal finding is evident.      Impression:        No acute cardiopulmonary findings.    Wallie Renshaw, DO   10/29/2018 8:47 PM    CT Head without Contrast [[id number]] Collected:  10/29/18 1900    Order Status:  Completed Updated:  10/29/18 1907    Narrative:         HISTORY: right facial droop onset @ 10 AM today    COMPARISON: 09/04/2018    TECHNIQUE: Helical CT of the head was performed without intravenous  contrast. Multiplanar reconstructions were reviewed. A combination of  automatic exposure control, adjustment of the mA and or KV according to  patient size, and/or use of iterative reconstruction technique was  utilized.    FINDINGS:    There is no acute intracranial hemorrhage, mass effect or midline shift.  Gray-white matter differentiation is preserved. No acute territorial  stroke hypodensity is demonstrated. The ventricles and sulci are normal  in size and configuration. The basal cisterns are patent.    The globes are intact. Clear paranasal sinuses and mastoid air cells.  The calvarium is intact.        Impression:        No acute intracranial findings demonstrated.     Wallie Renshaw, DO   10/29/2018 7:05 PM      .    Medical Decision Making   I am the first provider for this patient.    I reviewed the vital signs, available nursing notes, past medical history, past surgical history, family history and social history.    Vital Signs-Reviewed the patient's vital signs.     Patient Vitals for the past 12 hrs:   BP Temp Pulse Resp   10/30/18 0400 132/85 97.9 F (36.6 C) 88 18   10/30/18 0000 137/77 97.8 F (36.6 C) 74 18   10/29/18 2240 (!) 156/99 98.1 F (36.7 C) 84 18   10/29/18 2100 162/88 98.2 F (36.8 C) 67 18   10/29/18 1916 (!) 173/91 -- 62 20   10/29/18 1907 169/86 98.4 F (36.9 C) 78 16       Pulse Oximetry Analysis - Normal     EKG:  Interpreted by the  EP.   Time Interpreted: 1907   Rate: 69   Interpretation: NSR, normal axis, normal intervals, no ST abnormalities, normal EKG, TIW of III   Comparison: twi of avf normalized from 6/20      Old Medical Records: Admitted 2 months ago for similar sxs.    ED Course:  ED Course as of Oct 29 625   Tue Oct 29, 2018   2004 D/w Dr. Moise Boring, neuro, who will consult and requests MRI/MRA.     [MB]   2029 Discussed patient case with Dr. Andrey Campanile, [facility], who agrees with the plan and accepts for observation hospitalization to remote telemetry under Dr. Greig Castilla.     [MB]   2033 Dr. Greig Castilla requests pt be admitted to [facility].     [MB]      ED Course User Index  [MB] Ralph Leyden         Provider Note: 8F with PMH of seizure disorder, HTN, HLD, CVA presents with L facial droop and R sided weakness with R sided vision loss and hours of sharp chest pain, no head injury, no drug abuse, no cough/fever, VSS, NIH score of 6, symptom onset 11pm yesterday, allergic to contrast, hypertensive, neuro exam as above. Ddx: CVA, ACS, do not suspect GCA given lower body symptoms, plan for labwork, CT head unremarkable and neuro consult for likely admission.     Dr. Suzy Bouchard is the primary emergency doctor of record.          For Hospitalized Patients:    1. Hospitalization Decision Time:  The decision to admit this patient was made by the emergency provider at 2029 on 10/29/2018     2. Aspirin: Aspirin was given    3. Core Measures    Diagnosis     Clinical Impression:   1. Weakness    2. Chest pain, unspecified type        Treatment Plan:   ED Disposition     ED Disposition Condition Date/Time Comment    Observation  Tue Oct 29, 2018  8:34 PM Admitting Physician: Marya Amsler [[id number]]   Diagnosis: Weakness [241835]   Estimated Length of Stay: < 2 midnights   Tentative Discharge Plan?: Home or Self Care [1]   Patient Class: Observation [104]   Bed request comments: unit 25                This note was generated by the Epic EMR  system/ Dragon speech recognition and may contain inherent errors or omissions not intended by the user. Grammatical errors, random word insertions, deletions and pronoun errors  are occasional consequences of this technology due to software limitations. Not all errors are caught or corrected. If there are questions or concerns about the content of this note or information contained within the body of this dictation they should be addressed directly with the author for clarification.  _______________________________       Suzy Bouchard, MD  10/30/18 [phone]

## 2018-10-30 ENCOUNTER — Observation Stay: Payer: No Typology Code available for payment source

## 2018-10-30 ENCOUNTER — Other Ambulatory Visit: Payer: Self-pay

## 2018-10-30 LAB — CBC
Absolute NRBC: 0 10*3/uL (ref 0.00–0.00)
Hematocrit: 29.6 % — ABNORMAL LOW (ref 34.7–43.7)
Hgb: 8.6 g/dL — ABNORMAL LOW (ref 11.4–14.8)
MCH: 22.7 pg — ABNORMAL LOW (ref 25.1–33.5)
MCHC: 29.1 g/dL — ABNORMAL LOW (ref 31.5–35.8)
MCV: 78.1 fL (ref 78.0–96.0)
MPV: 12.4 fL (ref 8.9–12.5)
Nucleated RBC: 0 /100 WBC (ref 0.0–0.0)
Platelets: 182 10*3/uL (ref 142–346)
RBC: 3.79 10*6/uL — ABNORMAL LOW (ref 3.90–5.10)
RDW: 18 % — ABNORMAL HIGH (ref 11–15)
WBC: 10.27 10*3/uL — ABNORMAL HIGH (ref 3.10–9.50)

## 2018-10-30 LAB — BASIC METABOLIC PANEL
Anion Gap: 7 (ref 5.0–15.0)
BUN: 10 mg/dL (ref 7.0–19.0)
CO2: 24 mEq/L (ref 22–29)
Calcium: 8.1 mg/dL — ABNORMAL LOW (ref 8.5–10.5)
Chloride: 108 mEq/L (ref 100–111)
Creatinine: 0.8 mg/dL (ref 0.6–1.0)
Glucose: 111 mg/dL — ABNORMAL HIGH (ref 70–100)
Potassium: 3.6 mEq/L (ref 3.5–5.1)
Sodium: 139 mEq/L (ref 136–145)

## 2018-10-30 LAB — HEMOLYSIS INDEX: Hemolysis Index: 10 (ref 0–18)

## 2018-10-30 LAB — GFR: EGFR: >60

## 2018-10-30 LAB — TROPONIN I: Troponin I: <0.01 ng/mL (ref 0.00–0.05)

## 2018-10-30 MED ORDER — PANTOPRAZOLE SODIUM 40 MG PO TBEC
40.00 mg | DELAYED_RELEASE_TABLET | Freq: Every day | ORAL | 0 refills | Status: DC
Start: 2018-10-30 — End: 2022-07-20

## 2018-10-30 MED ORDER — LEVETIRACETAM 500 MG PO TABS
750.0000 mg | ORAL_TABLET | Freq: Two times a day (BID) | ORAL | Status: DC
Start: 2018-10-30 — End: 2018-10-30

## 2018-10-30 MED ORDER — TOPIRAMATE 25 MG PO TABS
50.0000 mg | ORAL_TABLET | Freq: Every evening | ORAL | Status: DC
Start: 2018-10-30 — End: 2018-10-30

## 2018-10-30 MED ORDER — TOPIRAMATE 50 MG PO TABS
25.0000 mg | ORAL_TABLET | Freq: Every evening | ORAL | 0 refills | Status: DC
Start: 2018-10-30 — End: 2019-10-17

## 2018-10-30 MED ORDER — MORPHINE SULFATE 2 MG/ML IJ/IV SOLN (WRAP)
1.0000 mg | Freq: Once | Status: AC
Start: 2018-10-30 — End: 2018-10-30
  Administered 2018-10-30: 02:00:00 1 mg via INTRAVENOUS
  Filled 2018-10-30: qty 1

## 2018-10-30 MED ORDER — LORAZEPAM 2 MG/ML IJ SOLN
1.00 mg | Freq: Three times a day (TID) | INTRAMUSCULAR | Status: DC | PRN
Start: 2018-10-30 — End: 2018-10-30
  Administered 2018-10-30: 15:00:00 1 mg via INTRAVENOUS
  Filled 2018-10-30: qty 1

## 2018-10-30 MED ORDER — BISMUTH SUBSALICYLATE 262 MG/15ML PO SUSP
30.00 mL | Freq: Three times a day (TID) | ORAL | Status: DC
Start: 2018-10-30 — End: 2018-10-30
  Filled 2018-10-30: qty 30

## 2018-10-30 MED ORDER — TOPIRAMATE 50 MG PO TABS
50.0000 mg | ORAL_TABLET | Freq: Every evening | ORAL | 0 refills | Status: DC
Start: 2018-10-30 — End: 2018-10-30

## 2018-10-30 NOTE — Consults (Signed)
NEUROLOGY CONSULTATION    Date Time: 10/30/18 12:44 PM  Patient Name: [name]  Attending Physician: Karlton Lemon, MD      Assessment & Plan:   Subjective sense of R facial weakness (though appears twisted to the right).  Some facial twitching as well.  Has had truly astounding number of prior evaluations for the same or similar presentations.       Pt is scheduled for an MRI brain.  I fully expect this to show nothing acute.   It is possible this pt had had an infarction in the past - there was gliosis seen on prior MRI.   Must consider migraine events or seizures.  Overall though malingering/conversion disorder is the most suspected.   I explained to this patient that given the sheer number of times she has been evaluated (see below) - that it is a waste of her time and hospital resources to continually re-evaluate in this manner.  Instead - if she is having the same or similar symptoms - she should simply speak with her neurologist and NOT proceed to the hospital.   Takes Topamax 25mg  QHS. Will increase this to 50mg  QHS and pt can follow up with her neurologist.   Given Hx of possible stroke in the past - continue ASA 81mg  QD.    History of Present Illness:   43 yo female with HTN, possible seizures, HLD, TIAs who reports R sided facial droop starting 4 days ago along with some R facial twitching activity.  She also reports intermittently having poor vision.  She achieve NIHSS = 6 in the ER but also reports anaphylaxis with CT contrast agent and so CTA could not be obtained.  Pt was noted to have diminished sensation R face/arm/leg.  Pt reports ongoing R facial weakness.  No further twitching activity.  Pt denies associated headaches.    MRI brain 09/05/18 - motion artifact but negative for acute findings.  MRI brain 04/2017 -  No acute findings.  Gliosis L CR   MRI brain 05/2016 - No acute findings  MRI brain 08/2015 - no acute findings    Neurology consultation 08/2015 - R facial weakness - negative  work up.    Neurology consultation 04/2016 - R sided weakness - negative work up, possible malingering.    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    Neurology consultation 09/2017 - R sided weakness/numbness x days.  Placed on ASA + Plavix for stroke prevention.  Couldn't fit into MRI scanner.    Neurology consultation 02/2018 - R sided weakness/paresthesias.  No MRI due to nose ring.  Possible migraine event, unclear though.    Neurology consulations 08/2018 - R arm/leg weakness and facial weakness.  Possible malingering.  Work up negative including MRI.  Started on Topamax for possible migraine events.    Past Medical History:     Past Medical History:   Diagnosis Date   . Hypercholesteremia    . Hypertension    . Seizures    . Stroke     2015 and 2017, right side   . TIA (transient ischemic attack) 2017       Meds:      Scheduled Meds: PRN Meds:    aspirin, 81 mg, Oral, Daily  atorvastatin, 40 mg, Oral, QHS  enoxaparin, 40 mg, Subcutaneous, Daily  levETIRAcetam, 750 mg, Intravenous, Q12H  lisinopril, 40 mg, Oral, Daily        Continuous Infusions:  hydrALAZINE, 5 mg, Q6H PRN  LORazepam, 1 mg, Q8H PRN  ondansetron, 4 mg, Once PRN          I personally reviewed all of the medications.  Medication list generated using all available resources.    Allergies   Allergen Reactions   . Contrast [Iodinated Diagnostic Agents] Anaphylaxis     Patient woke up in ICU after having CT with contrast, last remembers being in CT   . Fioricet [Butalbital-Apap-Caffeine] Hives   . Fentanyl    . Iodine    . Motrin [Ibuprofen] Swelling   . Percocet [Oxycodone-Acetaminophen]    . Shellfish-Derived Products    . Toradol [Ketorolac Tromethamine]    . Tramadol    . Tylenol [Acetaminophen] Hives       Social & Family History:     Social History     Socioeconomic History   . Marital status: Single     Spouse name: Not on file   . Number of children: Not on file   . Years of  education: Not on file   . Highest education level: Not on file   Occupational History   . Not on file   Social Needs   . Financial resource strain: Not on file   . Food insecurity     Worry: Not on file     Inability: Not on file   . Transportation needs     Medical: Not on file     Non-medical: Not on file   Tobacco Use   . Smoking status: Never Smoker   . Smokeless tobacco: Never Used   Substance and Sexual Activity   . Alcohol use: No   . Drug use: No   . Sexual activity: Not on file   Lifestyle   . Physical activity     Days per week: Not on file     Minutes per session: Not on file   . Stress: Not on file   Relationships   . Social Wellsite geologist on phone: Not on file     Gets together: Not on file     Attends religious service: Not on file     Active member of club or organization: Not on file     Attends meetings of clubs or organizations: Not on file     Relationship status: Not on file   . Intimate partner violence     Fear of current or ex partner: Not on file     Emotionally abused: Not on file     Physically abused: Not on file     Forced sexual activity: Not on file   Other Topics Concern   . Not on file   Social History Narrative   . Not on file       Family History   Problem Relation Age of Onset   . Myocardial Infarction Father 58   . Deep vein thrombosis Father        Review of Systems:   No eye, ear nose, throat problems; no coughing or wheezing or shortness of breath, No chest pain or orthopnea, no abdominal pain, nausea or vomiting, No pain in the body or extremities, no psychiatric, neurological, endocrine, hematological or cardiac complaints except as noted above. Marland Kitchen    Physical Exam:   Blood pressure 120/81, pulse 71, temperature 98.1 F (36.7 C), temperature source Oral, resp. rate 18, height 1.778 m (5\' 10" ), weight 142 kg (313 lb 1.6 oz), last  menstrual period 10/15/2018, SpO2 96 %.    Pt is alert though seems a bit sullen, eventually answering questions.  Says she came to hospital  because her PMD asked for this.  R face somewhat twisted to the right - L side actually appears weaker.  No UE drift  Mild reduced LT R face/arm/leg    Labs:     Recent Labs   Lab 10/30/18  0113 10/29/18  1937   Glucose 111* 87   BUN 10.0 9.0   Creatinine 0.8 0.8   Calcium 8.1* 8.7   Sodium 139 139   Potassium 3.6 3.6   Chloride 108 106   CO2 24 25   Albumin  --  4.0   AST (SGOT)  --  16   ALT  --  17   Bilirubin, Total  --  0.8   Alkaline Phosphatase  --  98     Recent Labs   Lab 10/30/18  0113 10/29/18  1937   WBC 10.27* 9.95*   Hgb 8.6* 9.5*   Hematocrit 29.6* 33.0*   MCV 78.1 78.2   MCH 22.7* 22.5*   MCHC 29.1* 28.8*   Platelets 182 208         No results for input(s): PTT, PT, INR in the last 72 hours.       Radiology Results (24 Hour)     Procedure Component Value Units Date/Time    XR Chest  AP Portable [[id number]] Collected:  10/29/18 2045    Order Status:  Completed Updated:  10/29/18 2049    Narrative:       1 view chest radiograph    Reason for exam: cp    Comparison: 09/04/2018    Comments: Frontal view of the chest was obtained. Normal heart size and  pulmonary vasculature. No focal consolidation, pleural effusion or  pneumothorax. No acute skeletal finding is evident.      Impression:        No acute cardiopulmonary findings.    Wallie Renshaw, DO   10/29/2018 8:47 PM    CT Head without Contrast [[id number]] Collected:  10/29/18 1900    Order Status:  Completed Updated:  10/29/18 1907    Narrative:         HISTORY: right facial droop onset @ 10 AM today    COMPARISON: 09/04/2018    TECHNIQUE: Helical CT of the head was performed without intravenous  contrast. Multiplanar reconstructions were reviewed. A combination of  automatic exposure control, adjustment of the mA and or KV according to  patient size, and/or use of iterative reconstruction technique was  utilized.    FINDINGS:    There is no acute intracranial hemorrhage, mass effect or midline shift.  Gray-white matter differentiation is preserved.  No acute territorial  stroke hypodensity is demonstrated. The ventricles and sulci are normal  in size and configuration. The basal cisterns are patent.    The globes are intact. Clear paranasal sinuses and mastoid air cells.  The calvarium is intact.        Impression:        No acute intracranial findings demonstrated.     Wallie Renshaw, DO   10/29/2018 7:05 PM           All recent brain and spine imaging (MRI, CT) results reviewed.    Chart reviewed    Code status confirmed    Case discussed with: patient and Dr. Clent Ridges and ER attending    40 minutes; >  50% time spent in counseling or coordination of care    Signed by: Merceda Elks, MD  [place]: [phone]       Answering Service: [phone]
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
## 2018-10-30 NOTE — PT Eval Note (Signed)
[facility]  Physical Therapy Evaluation and Treatment    Patient: Lennon Richins  MRN#: [id number]  Unit: [facility]  Bed: A2513/A2513-B    Time of Evaluation and Treatment:  Time Calculation  PT Received On: 10/30/18  Start Time: 7846  Stop Time: 0911  Time Calculation (min): 22 min    Evaluation Time: 12 minutes  Treatment Time: 10 minutes    Chart Review and Collaboration with Care Team: 5 minutes, not included in above time    PT Visit Number: 1    Consult received for [name] for PT Evaluation and Treatment 2/2 change in functional mobility.  Patient's medical condition is appropriate for Physical therapy intervention at this time.    Activity Orders:  PT eval and treat and progressive mobility protocol    Precautions and Contraindications:  Precautions  Other Precautions: NPO    Personal Protective Equipment (PPE)  gloves, procedure mask, eye shield/covering and pt wore procedure mask    Medical Diagnosis:  Weakness [R53.1]  Chest pain, unspecified type [R07.9]    History of Present Illness:  Wateen Varon is a 43 y.o. female admitted on 10/29/2018 with a primary hospital problem of weakness after presenting with R sided numbness and weakness along with facial droop (ongoing multiple days).  Pt also c/o chest pain (retrosternal and radiating to L shoulder), R vision loss (lasting 3-4 minutes), and elevated BP (178/128) that began the day of admission.  Of note, pt admitted twice in the last 9 months for chest pain and stroke-like symptoms (most recently 08/2018).      Patient Active Problem List   Diagnosis   . Chest pain   . Stroke-like symptoms   . Seizures   . Facial droop   . Tension type headache   . History of CVA (cerebrovascular accident)   . Class 2 obesity in adult   . Hematemesis   . Blood loss anemia   . HLD (hyperlipidemia)   . Numbness and tingling   . Hemorrhoids   . Right hemiparesis   . Nonspecific abnormal electroencephalogram (EEG)   . Headache   . Abdominal pain   .  Weakness       Past Medical/Surgical History:  Past Medical History:   Diagnosis Date   . Hypercholesteremia    . Hypertension    . Seizures    . Stroke     2015 and 2017, right side   . TIA (transient ischemic attack) 2017     Past Surgical History:   Procedure Laterality Date   . APPENDECTOMY     . CHOLECYSTECTOMY     . EGD, BIOPSY N/A 04/16/2017    Procedure: EGD, BIOPSY;  Surgeon: Pershing Proud, MD;  Location: [facility];  Service: Gastroenterology;  Laterality: N/A;       X-Rays/Tests/Labs:  Lab Results   Component Value Date/Time    HGB 8.6 (L) 10/30/2018 01:13 AM    HCT 29.6 (L) 10/30/2018 01:13 AM    K 3.6 10/30/2018 01:13 AM    NA 139 10/30/2018 01:13 AM    TROPI <0.01 10/30/2018 01:13 AM    TROPI <0.01 10/29/2018 07:37 PM    TROPI <0.01 10/29/2018 07:37 PM     All imaging reviewed, please see chart for details.    Social History:  Prior Level of Function  Prior level of function: Independent with ADLs, Ambulates independently  Baseline Activity Level: Household ambulation  DME Currently at Home: Gilmer Mor, [employer]  Home Living Arrangements  Living Arrangements: Spouse/significant other(pt notes that she can have assist upon d/c home if needed)  Home Layout: Two level(6 STE w/ rail)  DME Currently at Home: [employer], [employer]      Subjective:  Patient is agreeable to participation in the therapy session. Nursing clears patient for therapy.  Patient Goal: To have less pain.  Pain Assessment  Pain Assessment: Numeric Scale (0-10)  Pain Score: 8-severe pain  POSS Score: Awake and Alert  Pain Location: Chest  Pain Frequency: Constant/continuous  Pain Intervention(s): Repositioned;Ambulation/increased activity;Distraction;Relaxation technique      Objective:  Observation of Patient/Vital Signs:  BP 123/80   Pulse 64  SpO2 96% on RA; VSS t/o session    Patient received in bed with telemetry and personal mask in place.    Cognitive Status and Neuro Exam:  Cognition/Neuro Status  Arousal/Alertness:  Appropriate responses to stimuli  Safety Awareness: independent  Behavior: cooperative;calm;attentive    Musculoskeletal Examination  Gross ROM  Right Lower Extremity ROM: within functional limits  Left Lower Extremity ROM: within functional limits  Gross Strength  Right Lower Extremity Strength: within functional limits  Left Lower Extremity Strength: within functional limits       Functional Mobility:  Functional Mobility  Supine to Sit: Modified Independent;HOB raised  Scooting to EOB: Independent  Sit to Supine: Modified Independent(HOB elevated)  Sit to Stand: Supervision;Increased Effort  Stand to Sit: Supervision(increased effort)        Locomotion  Ambulation: Supervision(w/o AD; 20 feet)  Pattern: decreased step length;decreased cadence;Wide BOS  Stair Management: Supervision(simualted w/ standing marches, 1 UE support, x8 BL)     Balance  Balance  Balance: needs focused assessment  Sitting - Static: Good  Sitting - Dynamic: Good  Standing - Static: Good  Standing - Dynamic: Good(minus)    Participation and Activity Tolerance  Participation and Endurance  Participation Effort: excellent  Endurance: Endurance does not limit participation in activity  [place] Dyspnea Scale: 0 Dyspnea      Assessment:  Vilma Will is a 43 y.o. female admitted 10/29/2018. Patient is at baseline and has no inpatient skilled PT needs at this time. There are no comorbidities or other factors that affect plan of care and require modification of task. Pt's functional mobility is not impacted at this time.   Standardized tests and exams incorporated into evaluation include AMPAC mobility, balance, ROM  and Strength.   Pt demonstrates a stable clinical presentation.      Complexity Level Hx and Co-  morbidites Examination Clinical Decision Making Clinical Presentation   Low no impact 1-2 elements Limited options Stable       Treatment:  Balance:  Sitting Balance: sitting reaching activities, without instruction, without  support and independent and Standing Balance: standing reaching activities, standing weight shifting all planes, dynamic gait training, with support, supervision and performed progressed to w/o support times    Locomotion:  Ambulation: Supervision and Performed an additional 40 feet and Pattern: within functional limites and other (comment) no LOB or safety concers; pt reports she is at baseline    Education/Instruction/Safety:  Pt required increased time/effort and instruction for improved safety and technique t/o session.   All OOB activities and ambulation performed with use of gait belt for increased pt and therapist safety.    Encouraged continued functional mobility as tolerated with nursing staff assistance; pt verbalized understanding and agreement.  Discussed d/c from PT services and d/c recommendation; pt agreable.  Pt denies  quesitons/concerns regarding ability to perform necessary tasks upon d/c home.  Pt notes confidence in her ability for all activities OOB including ambualtion and stairs.  Educated the patient to role of physical therapy, plan of care, goals of therapy and HEP, safety with mobility and ADLs, energy conservation techniques, pursed lip breathing, discharge instructions, home safety, importance of continued ambulation, importance of sitting OOB in chair, benefits of continued mobility, and use of call bell or telephone as needed with pt verbalized understanding.     Patient left in bed (pt declines sitting in chair) with all medical equipment in place and call bell, telephone, and all personal items/needs within reach. RN notified of session outcome.  Pt left without additional needs or questions.       Plan:  Treatment/Interventions: No skilled interventions needed at this time  PT Frequency: one time visit - therapy discontinued  Risks/Benefits/POC Discussed with Pt/Family: With patient    PMP Activity: Step 7 - Walks out of Room  Distance Walked (ft) (Step 6,7): 60  Feet      Goals:  Goals  Goal Formulation: With patient  Time for Goal Acheivement: 1 visit  Goals: Select goal  Pt Will Demo Standing Balance: 4/5 indep, moves/returns center of grav 1-2"/1 plane, to maximize functional mobility and independence, Goal met  Pt Will Ambulate: 51-100 feet, with supervision, to maximize functional mobility and independence, Goal met    AM-PAC:yes        PT Basic Mobility Raw Score: 22  CMS 0-100% Score: 20.91%                DME Recommended for Discharge: No additional equipment/DME recommended at this time  Discharge Recommendation: Home with supervision    Roosevelt Locks, PT, DPT [phone]  10/30/2018 9:25 AM

## 2018-10-30 NOTE — Plan of Care (Signed)
The patient and care giver's learning abilities have been assessed. Today's individualized plan of care is to monitor labs, vitals, and I/Os, continue neuro checks every 4 hours, and prepare and update patient on upcoming procedures. MRI not done overnight because patient had some interference present which inhibited test. Patient agreed to have problem resolved so test can be done later today. The POC was discussed with the patient and care giver who agreed to it. Patient and care giver demonstrates understanding of disease process, treatment plan, medications and consequences of noncompliance. All questions and concerns were addressed.      Problem: Safety  Goal: Patient will be free from injury during hospitalization  Outcome: Progressing  Flowsheets (Taken 10/30/2018 0447)  Patient will be free from injury during hospitalization:   Assess patient's risk for falls and implement fall prevention plan of care per policy   Include patient/ family/ care giver in decisions related to safety   Ensure appropriate safety devices are available at the bedside   Hourly rounding   Provide and maintain safe environment   Use appropriate transfer methods     Problem: Day of Admission - Stroke  Goal: Core/Quality measure requirements - Admission  Outcome: Progressing  Flowsheets (Taken 10/30/2018 0449)  Core/Quality measure requirements - Admission:   Document NIH Stroke Scale on admission   Document nursing swallow/dysphagia screen on admission. If patient fails, keep patient NPO (follow your hospital protocol on swallowing screening).   VTE Prevention: Ensure anticoagulant(s) administered and/or anti-embolism stockings/devices documented as ordered   Ensure antithrombotic administered or contraindication documented by LIP   If diagnosis or history of Atrial Fib/Atrial Flutter, ensure oral anticoagulation is initiated or contraindication documented by LIP   Ensure lipid panel ordered     Problem: Chest Pain  Goal: Vital signs and  cardiac rhythm stable  Outcome: Progressing  Flowsheets (Taken 10/30/2018 0449)  Vital signs and cardiac rhythm stable:   Monitor /assess vital signs/cardiac rhythms   Monitor labs   Assess the need for oxygen therapy and administer as ordered     Problem: Chest Pain  Goal: Cardiac pain management  Outcome: Progressing  Flowsheets (Taken 10/30/2018 0449)  Cardiac pain management:   Assess/report chest pain/or related discomfort to LIP immediately   Instruct patient to report any change in pain status   Assess pain/or related discomfort on admission, during daily assessment, before and after any intervention   Include patient and patient care companion in decisions related to pain management     Problem: Chest Pain  Goal: Anxiety management/effective coping  Outcome: Progressing  Flowsheets (Taken 10/30/2018 0449)  Anxiety management/effective coping:   Assess/report uncontrolled anxiety, depression or ineffective coping to LIP   Encourage patient to immediately report any increase in anxiety and/or depression   Offer reassurance to decrease anxiety   Include patient in decision making of their care and give updates on their health status

## 2018-10-30 NOTE — Plan of Care (Signed)
Goals:  Patient will adhere to swallow safety precautions 100% of the time during meals.  Pt will demonstrate adequate oral phase in order to safely manage regular texture and thin   liquids for 3 meals /day without overt s/s aspiration and use of min cues for comp swallowing strategies.   Pt will use strategies to improve swallow function to tolerate the least restrictive diet without s/s aspiration or penetration provided with supervision.        Plan/Recommendations:  Diet Solids Recommendation: regular  Diet Liquids Recommendations: thin consistency  Precautions/Compensations: Awake/alert, Upright 90 degrees for all oral intake, 45 degrees upright after meals, Alternate solids and liquids, Swallow multiple times per bite/sip  Recommendation Discussed With: : Patient  SLP Frequency Recommended: follow-up visit only  Administration of Medications: PO  Aspiration Precautions posted at bedside: d/w pt and RN

## 2018-10-30 NOTE — Plan of Care (Signed)
Discharge Recommendation: Home with supervision  DME Recommended for Discharge: No additional equipment/DME recommended at this time    Is an Occupational Therapy Evaluation Indicated at this time? No, the patient does not require a OT evaluation.    Treatment/Interventions: No skilled interventions needed at this time  PT Frequency: one time visit - therapy discontinued     Next Visit Recommendation:   N/A    PMP Activity: Step 7 - Walks out of Room  Distance Walked (ft) (Step 6,7): 60 Feet  (Please See Therapy Evaluation for device and assistance level needed)    Goals:   Goals  Goal Formulation: With patient  Time for Goal Acheivement: 1 visit  Goals: Select goal  Pt Will Demo Standing Balance: 4/5 indep, moves/returns center of grav 1-2"/1 plane, to maximize functional mobility and independence, Goal met  Pt Will Ambulate: 51-100 feet, with supervision, to maximize functional mobility and independence, Goal met

## 2018-10-30 NOTE — Plan of Care (Addendum)
Problem: Safety  Goal: Patient will be free from injury during hospitalization  Outcome: Progressing  Flowsheets (Taken 10/30/2018 1448)  Patient will be free from injury during hospitalization:  . Assess patient's risk for falls and implement fall prevention plan of care per policy  . Provide and maintain safe environment  . Ensure appropriate safety devices are available at the bedside  . Include patient/ family/ care giver in decisions related to safety  . Hourly rounding  . Assess for patients risk for elopement and implement Elopement Risk Plan per policy     Problem: Pain  Goal: Pain at adequate level as identified by patient  Outcome: Progressing  Flowsheets (Taken 10/30/2018 1448)  Pain at adequate level as identified by patient:  . Identify patient comfort function goal  . Assess for risk of opioid induced respiratory depression, including snoring/sleep apnea. Alert healthcare team of risk factors identified.  . Assess pain on admission, during daily assessment and/or before any "as needed" intervention(s)  . Reassess pain within 30-60 minutes of any procedure/intervention, per Pain Assessment, Intervention, Reassessment (AIR) Cycle  . Evaluate if patient comfort function goal is met  . Evaluate patient's satisfaction with pain management progress  . Offer non-pharmacological pain management interventions  . Consult/collaborate with Physical Therapy, Occupational Therapy, and/or Speech Therapy  . Include patient/patient care companion in decisions related to pain management as needed     Problem: Every Day - Stroke  Goal: Neurological status is stable or improving  Outcome: Progressing  Flowsheets (Taken 10/30/2018 1448)  Neurological status is stable or improving:  Marland Kitchen Monitor/assess/document neurological assessment (Stroke: every 4 hours)  . Observe for seizure activity and initiate seizure precautions if indicated  . Perform CAM Assessment  Goal: Stable vital signs and fluid balance  Outcome:  Progressing  Flowsheets (Taken 10/30/2018 1448)  Stable vital signs and fluid balance:  . Position patient for maximum circulation/cardiac output  . Monitor and assess vitals every 4 hours or as ordered and hemodynamic parameters  . Encourage oral fluid intake  . Apply telemetry monitor as ordered     Problem: Chest Pain  Goal: Vital signs and cardiac rhythm stable  Outcome: Progressing  Flowsheets (Taken 10/30/2018 1448)  Vital signs and cardiac rhythm stable:  Marland Kitchen Monitor Earnest Bailey vital signs/cardiac rhythms  . Monitor labs   A/o x 4. Not on respiratory distress. Verbalized pain on left chest and right sided body weakness. SR on Tele. Right face twisted on right side. No slurred of speech. Seen by Speech Therapy, suggested for regular, thin consistency diet. Seen by Neuro and Dr. Clent Ridges, complaints was noted. MRI was done. For Neuro check, vitals monitoring, pain management and PT/OT. Safe environment provided. Will continue to monitor.
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
## 2018-10-30 NOTE — Consults (Addendum)
Nutritional Support Services  Nutrition Assessment    [name] Cook 43 y.o. female   MRN: [id number]    Summary of Nutrition Recommendations:    1. Continue liberalized diet, can add Cardiac restriction if pt with good intake    2. Daily weights    -----------------------------------------------------------------------------------------------------------------                                                            Assessment Data:   Referral Source: Consult, RN Screen  Reason for Referral: Obesity, MST - 2 (unsure weight loss)    Nutrition: Patient awake, sitting in bed at time of visit. Patient reports decreased appetite/intake <50% of baseline over the past week. Pt denies N/V/D/C, no chewing/swallowing difficulties. Pt reports she continues to have decreased appetite but is eager to eat at time of visit. Pt seen by SLP today, now on regular diet. Pt has not eaten meal yet today.. Pt likely not needing supplementation at this time, but will monitor nutritional intake. Unable to reach RN to discuss nutrition plan.     Learning Needs: no educational needs at this time    Hospital Admission: 43 y.o. female who was admitted twice in the past 9 months with stroke like symptoms and CP presented with strokelike symptoms and CP.     Medical Hx:  has a past medical history of Hypercholesteremia, Hypertension, Seizures, Stroke, and TIA (transient ischemic attack) (2017).    PSH: has a past surgical history that includes Appendectomy; Cholecystectomy; and EGD, BIOPSY (N/A, 04/16/2017).     Orders Placed This Encounter   Procedures   . Diet regular     Intake: no documented intake      ANTHROPOMETRIC  Height: 177.8 cm (5\' 10" )  Weight: 142 kg (313 lb 1.6 oz)  Weight Change: 19.96  IBW/kg (Calculated) Female: 68.16 kg  Body mass index is 44.93 kg/m.     Weight Monitoring Weight Weight Method   09/19/2017 139.935 kg Standing Scale   03/04/2018 141.522 kg    09/04/2018 114.76 kg    09/04/2018 114.76 kg    10/29/2018 118.389 kg  Stated   10/29/2018 142.021 kg Actual        Weight History Summary: Patient with large weight discrepancies in charted weights. Pt reports recently with stable weight with normal 1-2 kg variations by day. Will continue to monitor weights    Physical Assessment: 10/30/18  Head: no overt s/s subcutaneous muscle or fat loss  Upper Body: no overt s/s subcutaneous muscle or fat loss  Lower Body: no s/s subcutaneous fat or muscle loss  Edema: none per flow sheet  Skin: no wounds per flow sheet  GI function: abd soft, nd, +BS, LBM 10/29/18    ESTIMATED NEEDS    Total Daily Energy Needs: 2130 to 2840 kcal  Method for Calculating Energy Needs: 15 kcal - 20 kcal per kg  at 142 kg (Actual body weight)  Rationale: obese, noncritical       Total Daily Protein Needs: 102.3 to 136.4 g  Method for Calculating Protein Needs: 1.5 g - 2 g per kg at 68.2 kg (0 body weight)  Rationale: obese, noncritical      Total Daily Fluid Needs: 2046 to 2387 ml  Method for Calculating Fluid Needs: 30 ml - 35 ml  per kg at 68.2 kg (Ideal body weight)  Rationale: obese, noncritical      Pertinent Medications: zofran    IVF:      Allergies   Allergen Reactions   . Contrast [Iodinated Diagnostic Agents] Anaphylaxis     Patient woke up in ICU after having CT with contrast, last remembers being in CT   . Fioricet [Butalbital-Apap-Caffeine] Hives   . Fentanyl    . Iodine    . Motrin [Ibuprofen] Swelling   . Percocet [Oxycodone-Acetaminophen]    . Shellfish-Derived Products    . Toradol [Ketorolac Tromethamine]    . Tramadol    . Tylenol [Acetaminophen] Hives         Pertinent labs: unremarkable                                                              Nutrition Diagnosis      Inadequate oral intake related to decreased appetite as evidenced by pt report of <50% usual intake x 1 week PTA  (new)                                                              Intervention     Nutrition recommendation - Please refer to top of note                                                                Monitoring/Evaluation     Goals:     1. Patient will consume >/=75% of nutritional needs via meals by next RD follow up - new        Nutrition Risk Level: Moderate (will follow up at least 1 time per week and PRN)      Baxter Hire, RD, CNSC  Clinical Dietitian  [phone]

## 2018-10-30 NOTE — Progress Notes (Signed)
1800H Pt for discharge. Discharge paper given with instructions. Follow up check up with primary care provider. Advised to take her discharge home medication and to take Topamax 25mg  tablet at night. All questions and concerns were answered.On discharge paper Topamax was 50mg  but pt was aware that she should take 25mg  only. Refused to take new print out discharge paper as her transport already outside. IV catheter and Tele was removed. 1825H Discharge home with belongings.

## 2018-10-30 NOTE — Discharge Instr - AVS First Page (Signed)
Reason for your Hospital Admission:  Facial twitching and droop  Chest pain  Difficulty breathing when lying down    You were seen by neurology, MRI was negative for stroke, Keppra AND topamax recommended given possible component of complicated migraine       Instructions for after your discharge:  Please take 25mg  topamax, continue keppra  Please have a sleep study to evaluate for OSA     Call Your Doctor if you have:   Chest pain   Shortness of breath or difficulty breathing   Temperature greater than 100.4   Persistent nausea and vomiting   Severe uncontrolled pain   Signs of infection (pain, swelling, redness, tenderness, odor, or green/yellow discharge)   Headache or visual disturbances   Hives   Persistent dizziness or light-headedness   Extreme fatigue   Bleeding   Any other questions or concerns you may have after discharge   In an emergency, call 911 or go to an Emergency Department at a nearby hospital     Additional Instructions:   -Schedule a follow up appointment with your Primary Care Provider within 1 week of discharge.   -Carry a list of your medications and allergies with you at all times   -Resume your usual diet unless otherwise directed. Good nutrition promotes quicker healing.   -Call your pharmacy at least 1 week in advance to refill prescriptions     Information on Taking Medicine Safely   Medicine is given to help treat or prevent illness. But if you don't take it correctly, it might not help. It might even harm you. Your doctor or pharmacist can help you learn the right way to take your medicine. Listed below are some tips to help you take medicine safely.     Safety Tips   1. Have a routine for taking each medicine. Make it part of something you do each day, such as brushing your teeth or eating a meal.   2. When you go to the hospital or your doctor's office, bring all your current medicines in their original boxes or bottles. If you can't do that, bring an up-to-date list of your  medicines.   3. Do not stop taking a prescription medicine unless your doctor tells you to. Doing so could make your condition worse.   4. Do not share medicines.   5. Let your doctor and pharmacist know of any allergies you have.   6. Taking prescription medicines with alcohol, street drugs, herbs, supplements, or even some over-the-counter medicines can be harmful. Talk to your doctor or pharmacist before using any of these things while taking a prescription medcine.   7. When filling your prescriptions, try using the same pharmacy for all your medicines. If not, let the pharmacist know what medicines you are already taking.   8. Keep medicines out of the reach of children and pets. Store medicines in a cool, dry, dark place--not in the bathroom or in the kitchen near moisture or heat.   9. Do not use medicine that has expired or that doesn't look or smell right. Bring expired or old medicine to your pharmacy for disposal.     Using Generic Medicines   Medicines have brand names and generic (chemical) names. When a medicine is first made, it is sold only under its brand name. Later, it can be made and sold as a generic. Generic medicines cost less than brand-name medicines and most work just as well. Unless their doctor says otherwise, most  people can use the generic medicine instead of the brand-name medicine.     Always follow your healthcare professional's instructions.     If you are unable to obtain an appointment, unable to obtain newly prescribed medications, or are unclear about any of your discharge instructions please contact me at [phone] (M-F, 8am-3pm) or weekends and after hours via the hospital operator [phone], the hospital case manager, or your primary care physician.    Finally, as your discharging physician, you may be receiving a survey which is regarding my care. I would greatly value and appreciate your feedback as I strive for excellence.     Respectfully yours,    Ralene Bathe  MD  [facility]  [facility]
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
## 2018-10-31 LAB — ECG 12-LEAD
Atrial Rate: 69 {beats}/min
P Axis: 42 degrees
P-R Interval: 150 ms
Q-T Interval: 446 ms
QRS Duration: 86 ms
QTC Calculation (Bezet): 477 ms
R Axis: 19 degrees
T Axis: 23 degrees
Ventricular Rate: 69 {beats}/min

## 2019-01-28 ENCOUNTER — Emergency Department
Admission: EM | Admit: 2019-01-28 | Discharge: 2019-01-28 | Disposition: A | Discharge status: Home / Self Care | Payer: No Typology Code available for payment source | Attending: Emergency Medicine | Admitting: Emergency Medicine

## 2019-01-28 ENCOUNTER — Emergency Department: Payer: No Typology Code available for payment source

## 2019-01-28 DIAGNOSIS — R109 Unspecified abdominal pain: Secondary | ICD-10-CM | POA: Insufficient documentation

## 2019-01-28 DIAGNOSIS — Z8673 Personal history of transient ischemic attack (TIA), and cerebral infarction without residual deficits: Secondary | ICD-10-CM | POA: Insufficient documentation

## 2019-01-28 DIAGNOSIS — Z87898 Personal history of other specified conditions: Secondary | ICD-10-CM

## 2019-01-28 DIAGNOSIS — R1032 Left lower quadrant pain: Secondary | ICD-10-CM | POA: Insufficient documentation

## 2019-01-28 DIAGNOSIS — Z7982 Long term (current) use of aspirin: Secondary | ICD-10-CM | POA: Insufficient documentation

## 2019-01-28 DIAGNOSIS — Z885 Allergy status to narcotic agent status: Secondary | ICD-10-CM | POA: Insufficient documentation

## 2019-01-28 DIAGNOSIS — Z886 Allergy status to analgesic agent status: Secondary | ICD-10-CM | POA: Insufficient documentation

## 2019-01-28 DIAGNOSIS — E7800 Pure hypercholesterolemia, unspecified: Secondary | ICD-10-CM | POA: Insufficient documentation

## 2019-01-28 DIAGNOSIS — Z8669 Personal history of other diseases of the nervous system and sense organs: Secondary | ICD-10-CM | POA: Insufficient documentation

## 2019-01-28 DIAGNOSIS — R29810 Facial weakness: Secondary | ICD-10-CM | POA: Insufficient documentation

## 2019-01-28 DIAGNOSIS — I10 Essential (primary) hypertension: Secondary | ICD-10-CM | POA: Insufficient documentation

## 2019-01-28 DIAGNOSIS — F418 Other specified anxiety disorders: Secondary | ICD-10-CM

## 2019-01-28 DIAGNOSIS — F419 Anxiety disorder, unspecified: Secondary | ICD-10-CM | POA: Insufficient documentation

## 2019-01-28 LAB — URINALYSIS REFLEX TO MICROSCOPIC EXAM - REFLEX TO CULTURE
Bilirubin, UA: NEGATIVE
Blood, UA: NEGATIVE
Glucose, UA: NEGATIVE
Ketones UA: NEGATIVE
Leukocyte Esterase, UA: NEGATIVE
Nitrite, UA: NEGATIVE
Protein, UR: NEGATIVE
Specific Gravity UA: 1.018 (ref 1.001–1.035)
Urine pH: 7 (ref 5.0–8.0)
Urobilinogen, UA: 2 mg/dL (ref 0.2–2.0)

## 2019-01-28 LAB — CBC AND DIFFERENTIAL
Absolute NRBC: 0 10*3/uL (ref 0.00–0.00)
Basophils Absolute Automated: 0.04 10*3/uL (ref 0.00–0.08)
Basophils Automated: 0.5 %
Eosinophils Absolute Automated: 0.16 10*3/uL (ref 0.00–0.44)
Eosinophils Automated: 1.9 %
Hematocrit: 32.7 % — ABNORMAL LOW (ref 34.7–43.7)
Hgb: 9.3 g/dL — ABNORMAL LOW (ref 11.4–14.8)
Immature Granulocytes Absolute: 0.02 10*3/uL (ref 0.00–0.07)
Immature Granulocytes: 0.2 %
Lymphocytes Absolute Automated: 2 10*3/uL (ref 0.42–3.22)
Lymphocytes Automated: 23.2 %
MCH: 21.7 pg — ABNORMAL LOW (ref 25.1–33.5)
MCHC: 28.4 g/dL — ABNORMAL LOW (ref 31.5–35.8)
MCV: 76.2 fL — ABNORMAL LOW (ref 78.0–96.0)
MPV: 12.3 fL (ref 8.9–12.5)
Monocytes Absolute Automated: 0.7 10*3/uL (ref 0.21–0.85)
Monocytes: 8.1 %
Neutrophils Absolute: 5.71 10*3/uL (ref 1.10–6.33)
Neutrophils: 66.1 %
Nucleated RBC: 0 /100 WBC (ref 0.0–0.0)
Platelets: 206 10*3/uL (ref 142–346)
RBC: 4.29 10*6/uL (ref 3.90–5.10)
RDW: 17 % — ABNORMAL HIGH (ref 11–15)
WBC: 8.63 10*3/uL (ref 3.10–9.50)

## 2019-01-28 LAB — HEMOLYSIS INDEX: Hemolysis Index: 2 (ref 0–18)

## 2019-01-28 LAB — COMPREHENSIVE METABOLIC PANEL
ALT: 15 U/L (ref 0–55)
AST (SGOT): 14 U/L (ref 5–34)
Albumin/Globulin Ratio: 1 (ref 0.9–2.2)
Albumin: 4 g/dL (ref 3.5–5.0)
Alkaline Phosphatase: 77 U/L (ref 37–106)
Anion Gap: 8 (ref 5.0–15.0)
BUN: 9 mg/dL (ref 7.0–19.0)
Bilirubin, Total: 0.8 mg/dL (ref 0.2–1.2)
CO2: 25 mEq/L (ref 22–29)
Calcium: 8.8 mg/dL (ref 8.5–10.5)
Chloride: 108 mEq/L (ref 100–111)
Creatinine: 0.8 mg/dL (ref 0.6–1.0)
Globulin: 4.2 g/dL — ABNORMAL HIGH (ref 2.0–3.6)
Glucose: 67 mg/dL — ABNORMAL LOW (ref 70–100)
Potassium: 3.5 mEq/L (ref 3.5–5.1)
Protein, Total: 8.2 g/dL (ref 6.0–8.3)
Sodium: 141 mEq/L (ref 136–145)

## 2019-01-28 LAB — CELL MORPHOLOGY
Cell Morphology: ABNORMAL — AB
Platelet Estimate: NORMAL

## 2019-01-28 LAB — GFR: EGFR: >60

## 2019-01-28 LAB — LIPASE: Lipase: 20 U/L (ref 8–78)

## 2019-01-28 LAB — URINE BHCG POC: Urine bHCG POC: NEGATIVE

## 2019-01-28 MED ORDER — MAGNESIUM SULFATE IN D5W 1-5 GM/100ML-% IV SOLN
1.00 g | Freq: Once | INTRAVENOUS | Status: AC
Start: 2019-01-28 — End: 2019-01-28
  Administered 2019-01-28: 14:00:00 1 g via INTRAVENOUS
  Filled 2019-01-28: qty 100

## 2019-01-28 MED ORDER — DICYCLOMINE HCL 10 MG PO CAPS
10.00 mg | ORAL_CAPSULE | Freq: Once | ORAL | Status: DC
Start: 2019-01-28 — End: 2019-01-28
  Filled 2019-01-28: qty 1

## 2019-01-28 NOTE — ED Provider Notes (Signed)
EMERGENCY DEPARTMENT HISTORY AND PHYSICAL EXAM    PPE: Mask, face shield and gloves were worn every time I entered the room.     History of Presenting Illness:  History Provided By: Patient    [name] Cook is a 43 y.o. female pw right facial droop since 4 this morning.  Patient reports going to bed normally last night.  She woke up this morning and her husband noted that her right side of her face was drooping.  She also reports a headache and left sided abdominal pain.    She does not typically get headaches or migraines.  She has had this happen to her few times in the past and she reports being diagnosed possibly a TIA.    She was assigned a new neurologist recently.    She called the advice nurse and advised to immediately come to the hospital.    Reviewed and confirmed past medical, surgical, family and social history as documented.    PCP: Unknown Foley, NP  SPECIALISTS:    Review of Systems:  Review of Systems   Constitutional: Negative for chills and fever.   Respiratory: Negative for cough and shortness of breath.    Gastrointestinal: Positive for abdominal pain.   Neurological: Positive for facial asymmetry.   All other systems reviewed as negative.    Physical Exam:  Vitals:    01/28/19 1121 01/28/19 1130 01/28/19 1330   BP: (!) 168/108  154/90   Pulse: 64  65   Resp: 16     Temp:  98.5 F (36.9 C)    TempSrc: Oral Oral    SpO2: 94%  99%   Weight: 142 kg       Pulse Oximetry Interpretation: Normal     Physical Exam   Constitutional: Patient is alert.  Well nourished.  NAD  Head: Atraumatic.   Eyes: EOMI. PERRL  ENT:  MMM.   Neck:  FROM. No spinal tenderness. Neck supple.    Cardiovascular: Normal rate and regular rhythm.   Pulmonary/Chest: Effort normal and breath sounds normal. No respiratory distress.   Abdominal: Soft. There is LLQ/pelvic tenderness. Bowel sounds present and normal.    Musculoskeletal:  No lower extremity edema or tenderness.    Neurological: Patient is alert and oriented to  person, place, and time.  Right lower facial pulled back, left flat.  Other CN 2-12 intact.  Power 5/5.  Sensation intact to light touch.  Gait nml.   Skin: Skin is warm and dry.    Old Medical Records: Old medical records.  Previous electrocardiograms.  Nursing notes.  Previous radiology studies.    Patient Update Notes:  ED Course as of Feb 02 757   Tue Jan 28, 2019   1211 D/w Dr. Vincente Poli, Neuro, follow up outpatient.     [BK]      ED Course User Index  [BK] Tenny Craw, MD       Provider Notes: Facial asymmetry.  Multiple hospitalizations for the same without identifiable cause.  Has had thorough evaluation by inpatient and outpatient neurology.  MRIs have been normal.  Evaluated by neurology at bedside.  Advised to keep symptom diary and follow with outpatient neurology.  Patient with history of seizure and migraines.  Patient also with lower abdominal pain.  Labs and urine unremarkable.  Patient intolerant or allergic to do essentially all pain medications.  May use warm compresses and aspirin as needed.    Clinical Impression:   1. LLQ pain  2. Facial droop    3. History of seizure    4. Anxiety about health        ED Disposition     ED Disposition Condition Date/Time Comment    Discharge  Tue Jan 28, 2019  3:33 PM [name] discharge to home/self care.    Condition at disposition: Stable              This note was generated by the Epic EMR system/ Dragon speech recognition and may contain inherent errors or omissions not intended by the user. Grammatical errors, random word insertions, deletions and pronoun errors  are occasional consequences of this technology due to software limitations. Not all errors are caught or corrected. If there are questions or concerns about the content of this note or information contained within the body of this dictation they should be addressed directly with the author for clarification     Tenny Craw, MD  02/03/19 [phone]

## 2019-01-28 NOTE — Consults (Signed)
[facility] Consultation Note                                       Date Time: 01/28/19 12:53 PM  Patient Name: [name] Cook  Requesting Physician: Tenny Craw, MD  Date of Admission: 01/28/2019    CC / Reason for Consultation: R facial asymmetry    Assessment:   Pt is a 42yo F w/ hx of HTN, HLD, prior TIA, reported seizures on Keppra 500mg  bid and a multitude of focal presentations mostly facial weakness with negative work up who presents w/ R facial asymmetry noted upon awakening this am. 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 Prior imaging studies detailed below. Pt is already on seizure prophylaxis as well as secondary stroke prevention. Would not recommend repeating another work up for similar symptoms to prior presentations.    Plan:   -Would not recommend further work up or admission for R facial asymmetry   -Continue home Keppra 500mg  bid for seizure prevention  -Continue asa 81mg  for secondary stroke prevention   -F/u w/ primary neurologist as an outpatient    Discussed w/ patient    HPI   [name] Cook is a 44 y.o. female w/ hx of HTN, HLD, prior TIA, reported seizures on Keppra 500mg  bid and a multitude of presentations of focal symptoms mostly facial weakness with negative work up who presents w/ R facial asymmetry noted upon awakening this am. Also c/o intermittent vision blurriness and mild R sided weakness. Pt has presented w/ various different complaints to multiple different hospitals over the last 5 years including R facial weakness +/- R sided weakness/numbness, L facial weakness +/- L sided weakness/numbness along w/ intermittent vision blurriness. Brain MRI has been unrevealing x12 at least and have noted 3mm gliosis in L corona radiata.    Pt takes Keppra 500mg  bid for her reported seizures. 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 Last seizure was 4 years ago. Follows w/ a neurologist in [place]. Is on Topamax for  migraine prevention.       Prior work up:  11/01/18 ([facility]) - MRI brain - no acute findings   10/30/18 - MRI brain and MRA head/neck - no acute findings  09/05/18 - MRI brain w/ motion artifact but negative for acute findings.  04/17/17 - MRI brain - No acute findings. 3mm gliosis in L corona radiata.  03/10/18 (OSH) - MRI brain and MRA head/neck - no acute findings  01/04/17 (OSH) - MRI brain - no acute findings  05/13/16 - MRI brain and MRA neck - No acute findings  04/12/16 (OSH) - MRI brain - no acute findings  02/22/16 (OSH) - MRI brain - no acute findings   08/19/15 - MRI brain and MRA head/neck  - no acute findings  09/07/15 (OSH) - MRI brain - no acute findings  09/25/13 (OSH) - MRI brain - no acute findings    Neurology consultations ([place] only)  08/2015 - R facial weakness - negative work up.  04/2016 - R sided weakness - negative work up, possible malingering.  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  09/2017 - R sided weakness/numbness x days.  Placed on ASA + Plavix for stroke prevention.  Couldn't fit into MRI scanner.  02/2018 - R sided weakness/paresthesias.  No MRI due to nose ring.  Possible migraine event,  unclear though.  08/2018 - R arm/leg weakness and facial weakness.  Possible malingering.  Work up negative including MRI.  Started on Topamax for possible migraine events.  10/2018 - R facial weakness and R sided numbness.       Past Medical Hx     Past Medical History:   Diagnosis Date   . Hypercholesteremia    . Hypertension    . Seizures    . Stroke     2015 and 2017, right side   . TIA (transient ischemic attack) 2017          Past Surgical Hx:     Past Surgical History:   Procedure Laterality Date   . APPENDECTOMY     . CHOLECYSTECTOMY     . EGD, BIOPSY N/A 04/16/2017    Procedure: EGD, BIOPSY;  Surgeon: Pershing Proud, MD;  Location: [facility];  Service: Gastroenterology;  Laterality: N/A;        Family Medical History:      Family History   Problem  Relation Age of Onset   . Myocardial Infarction Father 29   . Deep vein thrombosis Father        Social Hx     Social History     Socioeconomic History   . Marital status: Single     Spouse name: Not on file   . Number of children: Not on file   . Years of education: Not on file   . Highest education level: Not on file   Occupational History   . Not on file   Social Needs   . Financial resource strain: Not on file   . Food insecurity     Worry: Not on file     Inability: Not on file   . Transportation needs     Medical: Not on file     Non-medical: Not on file   Tobacco Use   . Smoking status: Never Smoker   . Smokeless tobacco: Never Used   Substance and Sexual Activity   . Alcohol use: No   . Drug use: No   . Sexual activity: Not on file   Lifestyle   . Physical activity     Days per week: Not on file     Minutes per session: Not on file   . Stress: Not on file   Relationships   . Social Wellsite geologist on phone: Not on file     Gets together: Not on file     Attends religious service: Not on file     Active member of club or organization: Not on file     Attends meetings of clubs or organizations: Not on file     Relationship status: Not on file   . Intimate partner violence     Fear of current or ex partner: Not on file     Emotionally abused: Not on file     Physically abused: Not on file     Forced sexual activity: Not on file   Other Topics Concern   . Not on file   Social History Narrative   . Not on file       Meds     Home :   Prior to Admission medications    Medication Sig Start Date End Date Taking? Authorizing Provider   aspirin 81 MG chewable tablet Chew 1 tablet (81 mg total) by mouth daily 09/19/17  Yes Matiana, Raiford Noble, MD  atorvastatin (LIPITOR) 40 MG tablet Take by mouth.   Yes [provider]   LevETIRAcetam (KEPPRA PO) Take 500 mg by mouth 2 (two) times daily.      Yes [provider]   lisinopril (PRINIVIL,ZESTRIL) 40 MG tablet Take 40 mg by mouth daily.   Yes [provider]   pantoprazole (PROTONIX) 40 MG tablet Take 1 tablet (40 mg total) by mouth daily 10/30/18   Karlton Lemon, MD   topiramate (TOPAMAX) 50 MG tablet Take 0.5 tablets (25 mg total) by mouth nightly 10/30/18   Karlton Lemon, MD      Inpatient :   Current Facility-Administered Medications   Medication Dose Route Frequency   . magnesium sulfate  1 g Intravenous Once         Allergies    Contrast [iodinated diagnostic agents], Fioricet [butalbital-apap-caffeine], Fentanyl, Iodine, Motrin [ibuprofen], Percocet [oxycodone-acetaminophen], Shellfish-derived products, Toradol [ketorolac tromethamine], Tramadol, and Tylenol [acetaminophen]      Review of Systems   All other systems were reviewed and are negative except for that mentioned in the HPI    Physical Exam:   Temp:  [98.5 F (36.9 C)] 98.5 F (36.9 C)  Heart Rate:  [64] 64  Resp Rate:  [16] 16  BP: (168)/(108) 168/108     Vital Signs:  Reviewed    General: NAD. Cooperative with the exam  Neck: supple  CVS: warm and well perfused  Resp: no respiratory distress  Extremities: no pedal edema, no rashes noted    Mental Status: Awake, alert and oriented x3. Speech is fluent and non dysarthric. Naming and repetition intact. Able to follow commands. Appropriate fund of knowledge. Attention span and concentration appear normal.  Cranial nerves: PERRL, EOMI, VFF, sensation intact V1-V3, hearing intact to voice, palate rise symmetric, tongue protrudes midline. R lower lip twisted/displaced toward right - returns to midline when examining oropharynx   Motor: RUE drifts w/o pronation. Mild giveway weakness noted in RUE and RLE.   Sensory: Light touch intact throughout  Coordination: FTN and HKS intact  Gait: Normal base and stride    Labs:     Results     ** No results found for the last 24 hours. **          Rads:     Results for orders placed or performed during the hospital encounter of 01/28/19   CT Head without Contrast    Narrative    CLINICAL INDICATION:   Facial weakness, left-sided facial droop    TECHNIQUE:  Axial noncontrast CT images were obtained from the skull  base to the vertex. A combination of a automatic exposure control,  adjustment of the mA and/or kV  according to patient size and/or use of  iterative reconstruction technique was utilized.      COMPARISON:  10/29/2018    INTERPRETATION: Subarachnoid spaces and ventricles are within normal  limits for age. There is no acute intracranial hemorrhage, extra-axial  collection, or mass-effect. Gray-white differentiation is maintained.  Visualized paranasal sinuses and mastoid air cells are clear.      Impression      No acute intracranial abnormality.    Wyatt Portela, MD   01/28/2019 11:49 AM   Results for orders placed or performed during the hospital encounter of 10/29/18   MRI Brain WO Contrast    Narrative    MRI BRAIN WO CONTRAST:10/30/2018 2:55 PM     CLINICAL HISTORY: right sided weakness    TECHNIQUE: Noncontrast  Brain MR        COMPARISON: No change from MRI BRAIN WO CONTRAST with report dated  09/05/2018 6:24 PM.  .    FINDINGS:     Extra axial spaces: Normal in size and morphology for the patient's age.  Hemorrhage: None.  Ventricular system: Normal in size and morphology for the patient's age.  Basal cisterns: Normal.  Cerebral parenchyma: Tiny focal region of high T2 and FLAIR signal  intensity in the left frontal lobe on image 17 series 7 with a second  similar findings seen higher in the left frontal lobe on image 20 series  7, stable. No new findings   Midline shift: None.  Cerebellum: Normal.  Brainstem: Normal.    OTHER:    Calvarium: Normal.  Vascular system: Normal.  Visualized Paranasal sinuses: Clear.  Visualized Orbits: Normal.  Visualized upper cervical spine: Normal.  Sella and skull base: Normal.      Impression    No acute abnormality. No change from 09/05/2018          Laurena Slimmer, MD   10/30/2018 3:57 PM   MRA Head (intracranial) Without Contrast    Narrative    MR ANGIOGRAM HEAD WO  CONTRAST    CLINICAL INDICATION:   weakness R sided    COMPARISON: No change from MR ANGIOGRAM HEAD WO CONTRAST with report  dated 05/13/2016 9:10 AM.      TECHNIQUE: Routine MRA of the brain without contrast with MIP images.    FINDINGS:  There is no evidence for focal hemodynamically significant  stenosis of either intracranial internal carotid artery. The anterior,  middle, and posterior cerebral arteries appear unremarkable bilaterally.  The vertebrobasilar system appears unremarkable without focal  hemodynamically significant stenosis. There is no MR evidence to suggest  large aneurysm of the circle of Willis.    Impression     Unremarkable MRA of the brain.    Laurena Slimmer, MD   10/30/2018 4:08 PM     Results for orders placed or performed during the hospital encounter of 08/18/15   MRI Brain With / Without Contrast    Narrative    MRI BRAIN W WO CONTRAST  CLINICAL HISTORY:Right facial droop    COMPARISON: None available.    TECHNIQUE: Multiplanar multisequence imaging performed. Following 10 cc  Gadavist axial and coronal T1-weighted images were obtained.     INTERPRETATION: Small hyperintense foci noted in the left frontal  subcortical white matter and the centrum semiovale. No other focal  abnormality.  No area of restricted diffusion identified. No acute  hemorrhage, mass lesion or mass effect. The brainstem, cerebellum and  the craniocervical junction are within normal limits. The contrast  enhancement pattern is normal.      Impression         [No acute process Specifically there is no acute  cortical infarct, mass or evidence of hemorrhage.       Heron Nay, MD   08/19/2015 8:15 AM           Kambrie Eddleman Nanetta Batty) [facility]  [facility]  Spectralink [phone]  Available on Epic Chat

## 2019-01-28 NOTE — ED Triage Notes (Signed)
[name] Cook is a 43 y.o. female c/o L sided facial/mouth droop LKW last night 2330. Pt states waking up when she noticed her droop. Denies dysphagia.

## 2019-01-28 NOTE — EDIE (Signed)
COLLECTIVE?NOTIFICATION?01/28/2019 11:14?[name]?MRN: [id number]    Criteria Met      5 ED Visits in 12 Months    Security and Safety  No recent Security Events currently on file    ED Care Guidelines  There are currently no ED Care Guidelines for this patient. Please check your facility's medical records system.        Prescription Monitoring Program  000??- Narcotic Use Score  000??- Sedative Use Score  000??- Stimulant Use Score  000??- Overdose Risk Score  - All Scores range from 000-999 with 75% of the population scoring < 200 and on 1% scoring above 650  - The last digit of the narcotic, sedative, and stimulant score indicates the number of active prescriptions of that type  - Higher Use scores correlate with increased prescribers, pharmacies, mg equiv, and overlapping prescriptions  - Higher Overdose Risk Scores correlate with increased risk of unintentional overdose death   Concerning or unexpectedly high scores should prompt a review of the PMP record; this does not constitute checking PMP for prescribing purposes.      E.D. Visit Count (12 mo.)  Facility Visits   [facility] 1   [facility] 4   Total 5   Note: Visits indicate total known visits.      Recent Emergency Department Visit Summary  Date Facility [facility] Type Diagnoses or Chief Complaint   Jan 28, 2019 [place] [name] [place] [facility]      Facial drooping/Abdominal pain/headache      Oct 29, 2018 [place] [facility] [place] [name] [place] [facility]      Possible stroke      right facial droop      Chest Pain      Weakness      Chest pain, unspecified      Sep 04, 2018 [place] [facility] [place] [name] [place] [facility]      chest pain, SOB, headache, left face droopy      Slurred Speech      Facial Droop      Weakness      Facial weakness      Hypokalemia      Mar 10, 2018 [place] [facility] [place]      Facial Droop      Weakness      Facial weakness      Mar 03, 2018 [place] [facility]  [place] [name]      Facial Drooping; Chest Pain; Dizziness      Facial Droop      Headache      Facial weakness          Recent Inpatient Visit Summary  No recorded inpatient visits.     Care Team  There is not a care team on record at this time.   Collective Portal  This patient has registered at the [facility] Emergency Department   For more information visit: [email or web address]     PLEASE NOTE:     1.   Any care recommendations and other clinical information are provided as guidelines or for historical purposes only, and providers should exercise their own clinical judgment when providing care.    2.   You may only use this information for purposes of treatment, payment or health care operations activities, and subject to the limitations of applicable [employer] Policies.    3.   You should consult directly with the organization  that provided a care guideline or other clinical history with any questions about additional information or accuracy or completeness of information provided.    ? 2020 [employer] - [email or web address].com/

## 2019-01-28 NOTE — Discharge Instructions (Signed)
Abdominal Pain     You have been diagnosed with abdominal (belly) pain. The cause of your pain is not yet known.     Many things can cause abdominal pain such as infections and bowel (intestine) spasms. You might need another examination or more tests to find out why you have pain.     At this time, your pain does not seem to be caused by anything dangerous. You do not need surgery. You do not need to stay in the hospital.      Though we don’t believe your condition is dangerous right now, it is important to be careful. Sometimes a problem that seems mild now can become serious later. If you do not get completely better or your symptoms get worse, you should seek more care. This is why it is important that you get additional help unless you are 100% improved  · Follow up with your doctor.     For the next 24 hours, Drink only clear liquids such as:  · Water.  · Clear broth.  · Sports drinks.  · Clear caffeine-free soft drinks, like 7-Up or Sprite.     Return here or go to the nearest Emergency Department immediately if:  · Your pain does not go away or gets worse.  · You cannot keep fluids down   · Your vomit (throw up) is dark green.   · You vomit (throw up) blood or see blood in your stool (poop). Blood might be bright red or dark red. It can also be black and look like tar.  · You have a fever (temperature higher than 100.4ºF / 38ºC) or shaking chills.  · Your skin or eyes look yellow.  · Your urine looks brown.  · You have severe diarrhea.     If you can't follow up with your doctor, or if at any time you feel you need to be rechecked or seen again, come back here or go to the nearest emergency department.

## 2019-08-02 ENCOUNTER — Emergency Department
Admission: EM | Admit: 2019-08-02 | Discharge: 2019-08-03 | Disposition: A | Discharge status: Home / Self Care | Payer: No Typology Code available for payment source | Attending: Emergency Medicine | Admitting: Emergency Medicine

## 2019-08-02 DIAGNOSIS — Z8673 Personal history of transient ischemic attack (TIA), and cerebral infarction without residual deficits: Secondary | ICD-10-CM | POA: Insufficient documentation

## 2019-08-02 DIAGNOSIS — Z9049 Acquired absence of other specified parts of digestive tract: Secondary | ICD-10-CM | POA: Insufficient documentation

## 2019-08-02 DIAGNOSIS — R569 Unspecified convulsions: Secondary | ICD-10-CM | POA: Insufficient documentation

## 2019-08-02 DIAGNOSIS — L7634 Postprocedural seroma of skin and subcutaneous tissue following other procedure: Secondary | ICD-10-CM | POA: Insufficient documentation

## 2019-08-02 DIAGNOSIS — I10 Essential (primary) hypertension: Secondary | ICD-10-CM | POA: Insufficient documentation

## 2019-08-02 DIAGNOSIS — E785 Hyperlipidemia, unspecified: Secondary | ICD-10-CM | POA: Insufficient documentation

## 2019-08-02 DIAGNOSIS — Z90710 Acquired absence of both cervix and uterus: Secondary | ICD-10-CM | POA: Insufficient documentation

## 2019-08-02 DIAGNOSIS — R1030 Lower abdominal pain, unspecified: Secondary | ICD-10-CM | POA: Insufficient documentation

## 2019-08-02 DIAGNOSIS — D649 Anemia, unspecified: Secondary | ICD-10-CM | POA: Insufficient documentation

## 2019-08-02 LAB — HEMOLYSIS INDEX: Hemolysis Index: 42 — ABNORMAL HIGH (ref 0–18)

## 2019-08-02 LAB — CBC AND DIFFERENTIAL
Absolute NRBC: 0 10*3/uL (ref 0.00–0.00)
Basophils Absolute Automated: 0.04 10*3/uL (ref 0.00–0.08)
Basophils Automated: 0.4 %
Eosinophils Absolute Automated: 0.39 10*3/uL (ref 0.00–0.44)
Eosinophils Automated: 4.1 %
Hematocrit: 31.6 % — ABNORMAL LOW (ref 34.7–43.7)
Hgb: 9.6 g/dL — ABNORMAL LOW (ref 11.4–14.8)
Immature Granulocytes Absolute: 0.03 10*3/uL (ref 0.00–0.07)
Immature Granulocytes: 0.3 %
Lymphocytes Absolute Automated: 1.84 10*3/uL (ref 0.42–3.22)
Lymphocytes Automated: 19.2 %
MCH: 26.1 pg (ref 25.1–33.5)
MCHC: 30.4 g/dL — ABNORMAL LOW (ref 31.5–35.8)
MCV: 85.9 fL (ref 78.0–96.0)
MPV: 12.3 fL (ref 8.9–12.5)
Monocytes Absolute Automated: 0.63 10*3/uL (ref 0.21–0.85)
Monocytes: 6.6 %
Neutrophils Absolute: 6.66 10*3/uL — ABNORMAL HIGH (ref 1.10–6.33)
Neutrophils: 69.4 %
Nucleated RBC: 0 /100 WBC (ref 0.0–0.0)
Platelets: 192 10*3/uL (ref 142–346)
RBC: 3.68 10*6/uL — ABNORMAL LOW (ref 3.90–5.10)
RDW: 16 % — ABNORMAL HIGH (ref 11–15)
WBC: 9.59 10*3/uL — ABNORMAL HIGH (ref 3.10–9.50)

## 2019-08-02 LAB — COMPREHENSIVE METABOLIC PANEL
ALT: 59 U/L — ABNORMAL HIGH (ref 0–55)
AST (SGOT): 47 U/L — ABNORMAL HIGH (ref 5–34)
Albumin/Globulin Ratio: 0.7 — ABNORMAL LOW (ref 0.9–2.2)
Albumin: 3.5 g/dL (ref 3.5–5.0)
Alkaline Phosphatase: 113 U/L — ABNORMAL HIGH (ref 37–106)
Anion Gap: 11 (ref 5.0–15.0)
BUN: 11 mg/dL (ref 7.0–19.0)
Bilirubin, Total: 0.5 mg/dL (ref 0.2–1.2)
CO2: 26 mEq/L (ref 22–29)
Calcium: 9 mg/dL (ref 8.5–10.5)
Chloride: 105 mEq/L (ref 100–111)
Creatinine: 0.8 mg/dL (ref 0.6–1.0)
Globulin: 4.8 g/dL — ABNORMAL HIGH (ref 2.0–3.6)
Glucose: 121 mg/dL — ABNORMAL HIGH (ref 70–100)
Potassium: 3.8 mEq/L (ref 3.5–5.1)
Protein, Total: 8.3 g/dL (ref 6.0–8.3)
Sodium: 142 mEq/L (ref 136–145)

## 2019-08-02 LAB — LIPASE: Lipase: 23 U/L (ref 8–78)

## 2019-08-02 LAB — HCG QUANTITATIVE: hCG, Quant.: 1.9

## 2019-08-02 LAB — GFR: EGFR: >60

## 2019-08-02 NOTE — ED Triage Notes (Signed)
Pt had hyst on 3/16 and had complications with wound dishenise. Pt reports pelvic pain and lower abdominal pain. Pt has bandage over incision area. Denies NV. Pt had fever of 101 two days ago. Surgery done at [facility].

## 2019-08-02 NOTE — ED Provider Notes (Signed)
EMERGENCY DEPARTMENT HISTORY AND PHYSICAL EXAM     Physician/Midlevel provider first contact with patient: 08/02/19 2347         Date: 08/02/2019  Patient Name: [name] Cook    This note was generated by the Epic EMR system/ Dragon speech recognition and may contain inherent errors or omissions not intended by the user. Grammatical errors, random word insertions, deletions and pronoun errors  are occasional consequences of this technology due to software limitations. Not all errors are caught or corrected. If there are questions or concerns about the content of this note or information contained within the body of this dictation they should be addressed directly with the author for clarification.    History of Presenting Illness     Chief Complaint   Patient presents with    Abdominal Pain       History Provided By: Patient    Chief Complaint: Abdominal pain  Onset: Several weeks ago, worse in the last few days  Timing: Constant  Location: Low abdomen  Quality: Pressure  Radiation: none  Severity: Moderate to severe  Exacerbating factors: None  Alleviating factors: None  Associated Symptoms: Subjective fever  Pertinent Negatives: see ROS    Additional History: [name] Cook is a 44 y.o. female with PMH of strokes, seizures on Keppra, HTN, HLD, TIA, presents with low abdominal pain that started several weeks ago and became worse in the last few days.  Patient endorses subjective fevers.  Patient had TAH with bilateral oophorectomy on 05/27/2019 at [facility] (does not recall the name of her OBGYN and does not know the location or contact information of OBGYN clinic).  Postop course was complicated by wound dehiscence and pain.  Patient is allergic to multiple medications, but has been taking Dilaudid and morphine at home with little improvement of her symptoms.  Patient has been changing her dressings daily at home.  Patient denies chills, nausea, vomiting, BRBPR, melena, CP, dyspnea, cough,  palpitations, diaphoresis, urinary frequency or urgency, flank pain, hematuria, dysuria.    I reviewed patient's last ED visit, clinic visit or admission/discharge summary, as well as associated recent EKGs, lab or imaging results, if applicable.     PCP: Unknown Foley, NP    Past History   Past Medical History--reviewed, as per HPI  Past Surgical History--reviewed, appendectomy, cholecystectomy, hysterectomy  Family History--reviewed, noncontributory  Social History--reviewed, noncontributory  Allergies reviewed and documented as pertinent to the case.     Review of Systems     Review of Systems   Constitutional: Positive for fever (Subjective). Negative for chills.   HENT: Negative for congestion, sore throat and voice change.    Eyes: Negative for redness and visual disturbance.   Respiratory: Negative for cough, chest tightness and shortness of breath.    Cardiovascular: Negative for chest pain, palpitations and leg swelling.   Gastrointestinal: Positive for abdominal pain. Negative for nausea and vomiting.   Endocrine: Negative for polydipsia and polyphagia.   Genitourinary: Negative for dysuria, flank pain, frequency, urgency and vaginal bleeding.   Musculoskeletal: Negative for back pain, gait problem, neck pain and neck stiffness.   Skin: Negative for color change and rash.   Allergic/Immunologic: Negative for immunocompromised state.   Neurological: Negative for dizziness, syncope, weakness, numbness and headaches.   Hematological: Negative for adenopathy.   Psychiatric/Behavioral: Negative for agitation and confusion. The patient is not nervous/anxious.    All other systems reviewed and are negative.      Other than pertinent positives  as above, a complete ROS was reviewed and negative.    Physical Exam   BP (!) 164/103    Pulse 89    Temp 98.7 F (37.1 C) (Oral)    Resp 22    Ht 5\' 10"  (1.778 m)    Wt 137 kg    LMP 10/15/2018 (Exact Date)    SpO2 98%    BMI 43.33 kg/m     Physical Exam    Constitutional: She is oriented to person, place, and time. She appears well-developed and well-nourished.   BMI 43.33.  Appears nontoxic but uncomfortable secondary to pain.   HENT:   Head: Normocephalic and atraumatic.   Mouth/Throat: Oropharynx is clear and moist.   Eyes: Pupils are equal, round, and reactive to light. Conjunctivae are normal.   Neck: No JVD present.   Cardiovascular: Normal rate, regular rhythm, normal heart sounds and intact distal pulses.   Regular pulses, palpable in all 4 extremities. No pedal edema.    Pulmonary/Chest: Effort normal and breath sounds normal. No respiratory distress.   Normal work of breathing, clear lungs, no tachypnea, no respiratory distress.   Abdominal: Soft. Bowel sounds are normal. She exhibits distension. There is abdominal tenderness.   Low abdominal with distention and generalized TTP, some guarding but no rebound or rigidity.  Large healing vertical surgical incision with mild residual dehiscence without purulence, fluctuance or induration.  No costovertebral tenderness.   Musculoskeletal:         General: No tenderness or edema. Normal range of motion.      Cervical back: Normal range of motion and neck supple.   Neurological: She is alert and oriented to person, place, and time. She exhibits normal muscle tone.   Skin: Skin is warm and dry. No rash noted. She is not diaphoretic.   See abdominal section, skin otherwise c/d/i.    Psychiatric: She has a normal mood and affect. Her behavior is normal.   Nursing note and vitals reviewed.        Diagnostic Study Results     Labs -  Reviewed, if relevant to the case.    Radiologic Studies -   Reviewed, if relevant to the case.      Medical Decision Making   I am the first provider for this patient.    I reviewed the vital signs, available nursing notes, past medical history, past surgical history, family history and social history.    Vital Signs: Reviewed the patient's vital signs during ED stay.    I reviewed pt's  pulse oxymetry and cardiac monitor values, as relevant to the case.    Pulse Oximetry Analysis: 98% on RA- Normal    Cardiac Monitor:  Rhythm:  Normal Sinus, Rate:  Normal, Ectopy:  None    Personal Protective Equipment (PPE)  Gloves, N95 and surgical mask.    Record Review: The following, if applicable to the case, were reviewed and noted:    Old medical records.  Nursing notes.  Outside records.     ED Course:     44: Spoke with Dr. Lawerance Bach, OBGYN, who recommends for patient to follow up with her OBGYN, no antibiotics or admission indicated at this time.     1610: Information reviewed, pt comfortable going home. Discussed home rx, return precautions, follow up, symptom control. Possibility of evolving illness reviewed. All questions solicited and addressed.  Pt understands and is eager for discharge.    Provider Notes/MDM:     44 y.o. female  with multiple chronic conditions, recent TAH and bilateral oophorectomy that was complicated by wound dehiscence, presents with low abdominal pain and subjective fevers.  Patient afebrile and HDS in the ED.  Exam as above.  Patient does not recall name or contact information of her OB/GYN who performed surgery.  Labwork with normal WBC that bandemia or left shift, baseline anemia, normal platelets, creatinine, electrolytes, glucose, mildly elevated LFTs and alk phos (no upper abdominal TTP), normal lipase, negative hCG.  CT abdomen/pelvis with no SBO, infection or perforation, small postop seroma noted in right pelvic sidewall.  In ED patient received IV Dilaudid and Zofran with some improvement of symptoms.  Case discussed with [facility], who recommended follow-up with patient's own OB, no admission or antibiotics indicated at this time, given normal WBC and no fever in the ED.  Discussed information with patient and reassured. Pt is comfortable going home. Rx for Dilaudid and Zofran provided. Included list of OTC meds for symptomatic control. Talked about symptoms that would  prompt a return to the ED and patient expressed understanding. Pt discharged with OB/GYN follow up in 2 days. Pt agrees with the plan and is comfortable with discharge from the ED.      Diagnosis     Clinical Impression:   1. Lower abdominal pain    2. History of hysterectomy    3. Postoperative seroma of subcutaneous tissue after non-dermatologic procedure        Disposition:   ED Disposition     ED Disposition Condition Date/Time Comment    Discharge Boarder to Home  Sun Aug 03, 2019  3:13 AM           The above diagnostic process was due to medical necessity based on risk stratification of potential harm of patient's presenting complaint.     Attestations: This note is prepared by Ardis Hughs, MD, PHD, FACEP.      Juliane Poot, MD PhD  08/03/19 [phone]

## 2019-08-03 ENCOUNTER — Emergency Department: Payer: No Typology Code available for payment source

## 2019-08-03 MED ORDER — ONDANSETRON HCL 4 MG/2ML IJ SOLN
4.00 mg | Freq: Once | INTRAMUSCULAR | Status: AC
Start: 2019-08-03 — End: 2019-08-03
  Administered 2019-08-03: 02:00:00 4 mg via INTRAVENOUS
  Filled 2019-08-03: qty 2

## 2019-08-03 MED ORDER — ONDANSETRON 4 MG PO TBDP
4.00 mg | ORAL_TABLET | Freq: Four times a day (QID) | ORAL | 0 refills | Status: DC | PRN
Start: 2019-08-03 — End: 2019-10-17

## 2019-08-03 MED ORDER — HYDROMORPHONE HCL 1 MG/ML IJ SOLN
1.00 mg | Freq: Once | INTRAMUSCULAR | Status: AC
Start: 2019-08-03 — End: 2019-08-03
  Administered 2019-08-03: 02:00:00 1 mg via INTRAVENOUS
  Filled 2019-08-03: qty 1

## 2019-08-03 MED ORDER — HYDROMORPHONE HCL 2 MG PO TABS
2.00 mg | ORAL_TABLET | Freq: Four times a day (QID) | ORAL | 0 refills | Status: AC | PRN
Start: 2019-08-03 — End: 2019-08-10

## 2019-08-03 NOTE — Discharge Instructions (Signed)
Thank you for allowing us the privilege of taking care of you. Our goal is to provide you with EXCELLENT care. I hope we were able to accomplish that goal.    If there are any questions or concerns at all, we are always a resource for you.     You can contact us at the following numbers:  - [facility]: [phone]  - [facility]: [phone]    Please know that the diagnosis in the emergency department is often preliminary and a specific diagnosis cannot always be made. Every disease has a progression and may take time before it is recognized. It is very important to return to the ER or see you primary care doctor if you do not improve and especially if your symptoms worsen or change. IF YOU SENSE SOMETHING IS NOT RIGHT, DO NOT HESITATE TO RETURN TO THE ER.      Abdominal Pain     You have been diagnosed with abdominal (belly) pain. The cause of your pain is not yet known.     Many things can cause abdominal pain such as infections and bowel (intestine) spasms. You might need another examination or more tests to find out why you have pain.     At this time, your pain does not seem to be caused by anything dangerous. You do not need surgery. You do not need to stay in the hospital.      Though we don’t believe your condition is dangerous right now, it is important to be careful. Sometimes a problem that seems mild now can become serious later. If you do not get completely better or your symptoms get worse, you should seek more care. This is why it is important that you get additional help unless you are 100% improved  · Follow up with your doctor.     For the next 24 hours, Drink only clear liquids such as:  · Water.  · Clear broth.  · Sports drinks.  · Clear caffeine-free soft drinks, like 7-Up or Sprite.     Return here or go to the nearest Emergency Department immediately if:  · Your pain does not go away or gets worse.  · You cannot keep fluids down   · Your vomit (throw up) is dark green.    · You vomit (throw up) blood or see blood in your stool (poop). Blood might be bright red or dark red. It can also be black and look like tar.  · You have a fever (temperature higher than 100.4ºF / 38ºC) or shaking chills.  · Your skin or eyes look yellow.  · Your urine looks brown.  · You have severe diarrhea.     If you can't follow up with your doctor, or if at any time you feel you need to be rechecked or seen again, come back here or go to the nearest emergency department.

## 2019-10-15 ENCOUNTER — Emergency Department: Payer: Medicaid - Out of State

## 2019-10-15 ENCOUNTER — Observation Stay
Admission: EM | Admit: 2019-10-15 | Discharge: 2019-10-17 | Disposition: A | Discharge status: Home / Self Care | Payer: Medicaid - Out of State | Attending: Internal Medicine | Admitting: Internal Medicine

## 2019-10-15 DIAGNOSIS — B9561 Methicillin susceptible Staphylococcus aureus infection as the cause of diseases classified elsewhere: Secondary | ICD-10-CM | POA: Insufficient documentation

## 2019-10-15 DIAGNOSIS — G40909 Epilepsy, unspecified, not intractable, without status epilepticus: Secondary | ICD-10-CM | POA: Insufficient documentation

## 2019-10-15 DIAGNOSIS — Z20822 Contact with and (suspected) exposure to covid-19: Secondary | ICD-10-CM | POA: Insufficient documentation

## 2019-10-15 DIAGNOSIS — L03311 Cellulitis of abdominal wall: Secondary | ICD-10-CM | POA: Insufficient documentation

## 2019-10-15 DIAGNOSIS — R202 Paresthesia of skin: Secondary | ICD-10-CM | POA: Insufficient documentation

## 2019-10-15 DIAGNOSIS — E7800 Pure hypercholesterolemia, unspecified: Secondary | ICD-10-CM | POA: Insufficient documentation

## 2019-10-15 DIAGNOSIS — Z79899 Other long term (current) drug therapy: Secondary | ICD-10-CM | POA: Insufficient documentation

## 2019-10-15 DIAGNOSIS — Z8673 Personal history of transient ischemic attack (TIA), and cerebral infarction without residual deficits: Secondary | ICD-10-CM | POA: Insufficient documentation

## 2019-10-15 DIAGNOSIS — T8130XA Disruption of wound, unspecified, initial encounter: Secondary | ICD-10-CM

## 2019-10-15 DIAGNOSIS — R9389 Abnormal findings on diagnostic imaging of other specified body structures: Secondary | ICD-10-CM | POA: Insufficient documentation

## 2019-10-15 DIAGNOSIS — Z6841 Body Mass Index (BMI) 40.0 and over, adult: Secondary | ICD-10-CM | POA: Insufficient documentation

## 2019-10-15 DIAGNOSIS — I160 Hypertensive urgency: Secondary | ICD-10-CM | POA: Insufficient documentation

## 2019-10-15 DIAGNOSIS — I10 Essential (primary) hypertension: Secondary | ICD-10-CM | POA: Insufficient documentation

## 2019-10-15 DIAGNOSIS — T8131XA Disruption of external operation (surgical) wound, not elsewhere classified, initial encounter: Secondary | ICD-10-CM | POA: Insufficient documentation

## 2019-10-15 DIAGNOSIS — R509 Fever, unspecified: Secondary | ICD-10-CM | POA: Insufficient documentation

## 2019-10-15 DIAGNOSIS — Z8249 Family history of ischemic heart disease and other diseases of the circulatory system: Secondary | ICD-10-CM | POA: Insufficient documentation

## 2019-10-15 DIAGNOSIS — N99842 Postprocedural seroma of a genitourinary system organ or structure following a genitourinary system procedure: Secondary | ICD-10-CM | POA: Insufficient documentation

## 2019-10-15 DIAGNOSIS — R9431 Abnormal electrocardiogram [ECG] [EKG]: Secondary | ICD-10-CM

## 2019-10-15 DIAGNOSIS — Z90710 Acquired absence of both cervix and uterus: Secondary | ICD-10-CM | POA: Insufficient documentation

## 2019-10-15 DIAGNOSIS — R109 Unspecified abdominal pain: Secondary | ICD-10-CM | POA: Diagnosis present

## 2019-10-15 DIAGNOSIS — E669 Obesity, unspecified: Secondary | ICD-10-CM | POA: Insufficient documentation

## 2019-10-15 DIAGNOSIS — E785 Hyperlipidemia, unspecified: Secondary | ICD-10-CM | POA: Insufficient documentation

## 2019-10-15 DIAGNOSIS — Z7902 Long term (current) use of antithrombotics/antiplatelets: Secondary | ICD-10-CM | POA: Insufficient documentation

## 2019-10-15 DIAGNOSIS — Z7901 Long term (current) use of anticoagulants: Secondary | ICD-10-CM | POA: Insufficient documentation

## 2019-10-15 DIAGNOSIS — Z8669 Personal history of other diseases of the nervous system and sense organs: Secondary | ICD-10-CM

## 2019-10-15 DIAGNOSIS — Z9049 Acquired absence of other specified parts of digestive tract: Secondary | ICD-10-CM | POA: Insufficient documentation

## 2019-10-15 DIAGNOSIS — L03818 Cellulitis of other sites: Secondary | ICD-10-CM | POA: Diagnosis present

## 2019-10-15 DIAGNOSIS — Z7982 Long term (current) use of aspirin: Secondary | ICD-10-CM | POA: Insufficient documentation

## 2019-10-15 DIAGNOSIS — T8149XA Infection following a procedure, other surgical site, initial encounter: Principal | ICD-10-CM | POA: Insufficient documentation

## 2019-10-15 LAB — CBC AND DIFFERENTIAL
Absolute NRBC: 0 10*3/uL (ref 0.00–0.00)
Basophils Absolute Automated: 0.03 10*3/uL (ref 0.00–0.08)
Basophils Automated: 0.5 %
Eosinophils Absolute Automated: 0.26 10*3/uL (ref 0.00–0.44)
Eosinophils Automated: 4 %
Hematocrit: 34.2 % — ABNORMAL LOW (ref 34.7–43.7)
Hgb: 10.5 g/dL — ABNORMAL LOW (ref 11.4–14.8)
Immature Granulocytes Absolute: 0.01 10*3/uL (ref 0.00–0.07)
Immature Granulocytes: 0.2 %
Lymphocytes Absolute Automated: 1.51 10*3/uL (ref 0.42–3.22)
Lymphocytes Automated: 23.1 %
MCH: 25.9 pg (ref 25.1–33.5)
MCHC: 30.7 g/dL — ABNORMAL LOW (ref 31.5–35.8)
MCV: 84.4 fL (ref 78.0–96.0)
MPV: 12.6 fL — ABNORMAL HIGH (ref 8.9–12.5)
Monocytes Absolute Automated: 0.45 10*3/uL (ref 0.21–0.85)
Monocytes: 6.9 %
Neutrophils Absolute: 4.27 10*3/uL (ref 1.10–6.33)
Neutrophils: 65.3 %
Nucleated RBC: 0 /100 WBC (ref 0.0–0.0)
Platelets: 178 10*3/uL (ref 142–346)
RBC: 4.05 10*6/uL (ref 3.90–5.10)
RDW: 16 % — ABNORMAL HIGH (ref 11–15)
WBC: 6.53 10*3/uL (ref 3.10–9.50)

## 2019-10-15 LAB — LACTIC ACID, PLASMA: Lactic Acid: 0.9 mmol/L (ref 0.2–2.0)

## 2019-10-15 LAB — COVID-19 (SARS-COV-2): SARS CoV 2 Overall Result: NEGATIVE

## 2019-10-15 LAB — URINALYSIS REFLEX TO MICROSCOPIC EXAM - REFLEX TO CULTURE
Bilirubin, UA: NEGATIVE
Blood, UA: NEGATIVE
Glucose, UA: NEGATIVE
Ketones UA: NEGATIVE
Nitrite, UA: NEGATIVE
Protein, UR: NEGATIVE
Specific Gravity UA: 1.018 (ref 1.001–1.035)
Urine pH: 6 (ref 5.0–8.0)
Urobilinogen, UA: NEGATIVE mg/dL (ref 0.2–2.0)

## 2019-10-15 LAB — COMPREHENSIVE METABOLIC PANEL
ALT: 25 U/L (ref 0–55)
AST (SGOT): 18 U/L (ref 5–34)
Albumin/Globulin Ratio: 0.8 — ABNORMAL LOW (ref 0.9–2.2)
Albumin: 3.4 g/dL — ABNORMAL LOW (ref 3.5–5.0)
Alkaline Phosphatase: 94 U/L (ref 37–106)
Anion Gap: 8 (ref 5.0–15.0)
BUN: 12 mg/dL (ref 7.0–19.0)
Bilirubin, Total: 0.8 mg/dL (ref 0.2–1.2)
CO2: 26 mEq/L (ref 22–29)
Calcium: 9 mg/dL (ref 8.5–10.5)
Chloride: 107 mEq/L (ref 100–111)
Creatinine: 0.8 mg/dL (ref 0.6–1.0)
Globulin: 4.4 g/dL — ABNORMAL HIGH (ref 2.0–3.6)
Glucose: 87 mg/dL (ref 70–100)
Potassium: 3.7 mEq/L (ref 3.5–5.1)
Protein, Total: 7.8 g/dL (ref 6.0–8.3)
Sodium: 141 mEq/L (ref 136–145)

## 2019-10-15 LAB — GFR: EGFR: >60

## 2019-10-15 LAB — LIPASE: Lipase: 26 U/L (ref 8–78)

## 2019-10-15 LAB — TROPONIN I: Troponin I: <0.01 ng/mL (ref 0.00–0.05)

## 2019-10-15 LAB — HCG QUANTITATIVE: hCG, Quant.: 1.7

## 2019-10-15 LAB — HEMOLYSIS INDEX: Hemolysis Index: 1 (ref 0–18)

## 2019-10-15 MED ORDER — ONDANSETRON HCL 4 MG/2ML IJ SOLN
4.0000 mg | Freq: Once | INTRAMUSCULAR | Status: AC
Start: 2019-10-15 — End: 2019-10-15
  Administered 2019-10-15: 15:00:00 4 mg via INTRAVENOUS
  Filled 2019-10-15: qty 2

## 2019-10-15 MED ORDER — STERILE WATER FOR INJECTION IJ SOLN
2.0000 g | Freq: Once | INTRAMUSCULAR | Status: DC
Start: 2019-10-15 — End: 2019-10-15

## 2019-10-15 MED ORDER — ONDANSETRON HCL 4 MG/2ML IJ SOLN
4.0000 mg | Freq: Once | INTRAMUSCULAR | Status: DC | PRN
Start: 2019-10-15 — End: 2019-10-17

## 2019-10-15 MED ORDER — ATORVASTATIN CALCIUM 40 MG PO TABS
40.0000 mg | ORAL_TABLET | Freq: Every evening | ORAL | Status: DC
Start: 2019-10-15 — End: 2019-10-17
  Administered 2019-10-16: 22:00:00 40 mg via ORAL
  Filled 2019-10-15 (×2): qty 1

## 2019-10-15 MED ORDER — LISINOPRIL 20 MG PO TABS
40.0000 mg | ORAL_TABLET | Freq: Every day | ORAL | Status: DC
Start: 2019-10-15 — End: 2019-10-17
  Administered 2019-10-16: 09:00:00 40 mg via ORAL
  Filled 2019-10-15 (×2): qty 2

## 2019-10-15 MED ORDER — SODIUM CHLORIDE 0.9 % IV MBP
4.5000 g | Freq: Four times a day (QID) | INTRAVENOUS | Status: DC
Start: 2019-10-15 — End: 2019-10-17
  Administered 2019-10-15 – 2019-10-17 (×7): 4.5 g via INTRAVENOUS
  Filled 2019-10-15 (×7): qty 20

## 2019-10-15 MED ORDER — MORPHINE SULFATE 4 MG/ML IJ/IV SOLN (WRAP)
4.0000 mg | Freq: Once | Status: AC
Start: 2019-10-15 — End: 2019-10-15
  Administered 2019-10-15: 15:00:00 4 mg via INTRAVENOUS
  Filled 2019-10-15: qty 1

## 2019-10-15 MED ORDER — PANTOPRAZOLE SODIUM 40 MG PO TBEC
40.0000 mg | DELAYED_RELEASE_TABLET | Freq: Every morning | ORAL | Status: DC
Start: 2019-10-16 — End: 2019-10-17
  Administered 2019-10-16 – 2019-10-17 (×2): 40 mg via ORAL
  Filled 2019-10-15 (×2): qty 1

## 2019-10-15 MED ORDER — AMLODIPINE BESYLATE 5 MG PO TABS
5.0000 mg | ORAL_TABLET | Freq: Every day | ORAL | Status: DC
Start: 2019-10-15 — End: 2019-10-17
  Administered 2019-10-16 – 2019-10-17 (×2): 5 mg via ORAL
  Filled 2019-10-15 (×2): qty 1

## 2019-10-15 MED ORDER — GLUCOSE 40 % PO GEL
15.0000 g | ORAL | Status: DC | PRN
Start: 2019-10-15 — End: 2019-10-17

## 2019-10-15 MED ORDER — HYDROCHLOROTHIAZIDE 25 MG PO TABS
25.0000 mg | ORAL_TABLET | Freq: Every day | ORAL | Status: DC
Start: 2019-10-15 — End: 2019-10-17
  Administered 2019-10-15 – 2019-10-17 (×3): 25 mg via ORAL
  Filled 2019-10-15 (×3): qty 1

## 2019-10-15 MED ORDER — VANCOMYCIN HCL 1 G IV SOLR
1750.0000 mg | Freq: Once | INTRAVENOUS | Status: AC
Start: 2019-10-15 — End: 2019-10-15
  Administered 2019-10-15: 18:00:00 1750 mg via INTRAVENOUS
  Filled 2019-10-15: qty 1750

## 2019-10-15 MED ORDER — MELATONIN 1 MG PO TABS
3.0000 mg | ORAL_TABLET | Freq: Every evening | ORAL | Status: DC | PRN
Start: 2019-10-15 — End: 2019-10-17
  Administered 2019-10-17: 01:00:00 3 mg via ORAL
  Filled 2019-10-15: qty 3

## 2019-10-15 MED ORDER — RISAQUAD PO CAPS
1.0000 | ORAL_CAPSULE | Freq: Every day | ORAL | Status: DC
Start: 2019-10-15 — End: 2019-10-17
  Administered 2019-10-16 – 2019-10-17 (×2): 1 via ORAL
  Filled 2019-10-15 (×2): qty 1

## 2019-10-15 MED ORDER — ENOXAPARIN SODIUM 40 MG/0.4ML SC SOLN
40.0000 mg | Freq: Every day | SUBCUTANEOUS | Status: DC
Start: 2019-10-15 — End: 2019-10-17
  Administered 2019-10-16 – 2019-10-17 (×2): 40 mg via SUBCUTANEOUS
  Filled 2019-10-15 (×2): qty 0.4

## 2019-10-15 MED ORDER — SODIUM CHLORIDE 0.9 % IV BOLUS
1000.0000 mL | Freq: Once | INTRAVENOUS | Status: AC
Start: 2019-10-15 — End: 2019-10-15
  Administered 2019-10-15: 17:00:00 1000 mL via INTRAVENOUS

## 2019-10-15 MED ORDER — STERILE WATER FOR INJECTION IJ SOLN
2.0000 g | INTRAMUSCULAR | Status: DC
Start: 2019-10-15 — End: 2019-10-15
  Administered 2019-10-15: 17:00:00 2 g via INTRAVENOUS
  Filled 2019-10-15: qty 2000

## 2019-10-15 MED ORDER — MORPHINE SULFATE 4 MG/ML IJ/IV SOLN (WRAP)
4.0000 mg | Status: DC | PRN
Start: 2019-10-15 — End: 2019-10-16
  Administered 2019-10-15 – 2019-10-16 (×4): 4 mg via INTRAVENOUS
  Filled 2019-10-15 (×4): qty 1

## 2019-10-15 MED ORDER — LEVETIRACETAM 250 MG PO TABS
500.0000 mg | ORAL_TABLET | Freq: Two times a day (BID) | ORAL | Status: DC
Start: 2019-10-15 — End: 2019-10-17
  Administered 2019-10-15 – 2019-10-17 (×4): 500 mg via ORAL
  Filled 2019-10-15 (×4): qty 2

## 2019-10-15 MED ORDER — STERILE WATER FOR INJECTION IJ SOLN
4.5000 g | Freq: Once | INTRAVENOUS | Status: DC
Start: 2019-10-15 — End: 2019-10-15

## 2019-10-15 MED ORDER — ASPIRIN 81 MG PO CHEW
81.0000 mg | CHEWABLE_TABLET | Freq: Every day | ORAL | Status: DC
Start: 2019-10-15 — End: 2019-10-17
  Administered 2019-10-15 – 2019-10-17 (×3): 81 mg via ORAL
  Filled 2019-10-15 (×3): qty 1

## 2019-10-15 MED ORDER — GLUCAGON 1 MG IJ SOLR (WRAP)
1.0000 mg | INTRAMUSCULAR | Status: DC | PRN
Start: 2019-10-15 — End: 2019-10-17

## 2019-10-15 MED ORDER — VANCOMYCIN 1000 MG IN 250 ML NS IVPB VIAL-MATE (CNR)
1.0000 g | Freq: Once | INTRAVENOUS | Status: DC
Start: 2019-10-15 — End: 2019-10-15

## 2019-10-15 MED ORDER — DEXTROSE 50 % IV SOLN
12.5000 g | INTRAVENOUS | Status: DC | PRN
Start: 2019-10-15 — End: 2019-10-17

## 2019-10-15 MED ORDER — NALOXONE HCL 0.4 MG/ML IJ SOLN (WRAP)
0.2000 mg | INTRAMUSCULAR | Status: DC | PRN
Start: 2019-10-15 — End: 2019-10-17

## 2019-10-15 MED ORDER — MORPHINE SULFATE 4 MG/ML IJ/IV SOLN (WRAP)
4.0000 mg | Freq: Once | Status: AC
Start: 2019-10-15 — End: 2019-10-15
  Administered 2019-10-15: 17:00:00 4 mg via INTRAVENOUS
  Filled 2019-10-15: qty 1

## 2019-10-15 NOTE — ED Triage Notes (Signed)
[facility] EMERGENCY DEPARTMENT TRIAGE NOTE     [name] Cook is a 44 y.o. female who presents to the ED with a chief complaint of: chest pain, nausea, fever      Onset: today   Pt woke up with chest pain, nausea. Pt had a hysterectomy in March and states, "she has a hole in her stomach." Pt states the chest pain is mid sternal to left shoulder and comes and goes. Pt states her incision is draining a lot.    LOC: a & o x 4     Vitals: Blood pressure (!) 172/113, pulse 85, temperature 98.4 F (36.9 C), temperature source Oral, resp. rate 18, height 5\' 10"  (1.778 m), weight 132.5 kg, last menstrual period 10/15/2018, SpO2 99 %.    Meds taken prior to ED arrival: none

## 2019-10-15 NOTE — H&P (Addendum)
[facility]      Patient: [name] Cook  Date: 10/15/2019   DOB: [birth year]  Admission Date: 10/15/2019   MRN: [id number]  Attending: Maple Mirza         Chief Complaint   Patient presents with    Chest Pain    Wound Check    Abdominal Pain      History Gathered From: patient    HISTORY AND PHYSICAL     [name] Cook is a 44 y.o. female with a PMHx of CVA, frequent admissions for CP/stroke like symptoms, h/o TBI with seizures who presented with non-healing abd wound. Patient had hysterectomy at [facility] in March. Since then the wound hasn't healed. She has seen the surgeon several times. Two weeks ago she finished 3 weeks of antibiotics through the midline, and said she was on rocephin and levaquin, also on doxycycline. She always has some wound drainage but this increased in the last few weeks, greenish drainage, and yesterday she felt hot and checked her temp and it was 101. The drainage was soaking through her clothes and she was changing the dressing 4-5x/day.  No cough sob abd pain nausea/vomiting diarrhea.  Says she has been getting paresthesias midchest that started as she got anxious regarding the wound. Blood pressure was also more elevated than usual to 177/105.Marland Kitchen  Patient had covid last year. Has not been vaccinated yet. No sick contacts.    Since her hysterectomy she has remained on daily lovenox 40mg , although she isn't sure why.    Past Medical History:   Diagnosis Date    Hypercholesteremia     Hypertension     Seizures     Stroke     2015 and 2017, right side    TIA (transient ischemic attack) 2017       Past Surgical History:   Procedure Laterality Date    APPENDECTOMY      CHOLECYSTECTOMY      EGD, BIOPSY N/A 04/16/2017    Procedure: EGD, BIOPSY;  Surgeon: Pershing Proud, MD;  Location: [facility];  Service: Gastroenterology;  Laterality: N/A;    HYSTERECTOMY         Prior to Admission medications    Medication Sig Start Date End Date Taking? Authorizing Provider    atorvastatin (LIPITOR) 40 MG tablet Take by mouth.   Yes [provider]   LevETIRAcetam (KEPPRA PO) Take 500 mg by mouth 2 (two) times daily.      Yes [provider]   lisinopril (PRINIVIL,ZESTRIL) 40 MG tablet Take 40 mg by mouth daily.   Yes [provider]   pantoprazole (PROTONIX) 40 MG tablet Take 1 tablet (40 mg total) by mouth daily 10/30/18  Yes Karlton Lemon, MD   aspirin 81 MG chewable tablet Chew 1 tablet (81 mg total) by mouth daily 09/19/17   Derek Mound, MD   ondansetron (ZOFRAN-ODT) 4 MG disintegrating tablet Take 1 tablet (4 mg total) by mouth every 6 (six) hours as needed for Nausea 08/03/19   Juliane Poot, MD PhD   topiramate (TOPAMAX) 50 MG tablet Take 0.5 tablets (25 mg total) by mouth nightly 10/30/18   Karlton Lemon, MD       Allergies   Allergen Reactions    Contrast [Iodinated Diagnostic Agents] Anaphylaxis     Patient woke up in ICU after having CT with contrast, last remembers being in CT    Fioricet [Butalbital-Apap-Caffeine] Hives    Bentyl [Dicyclomine] Edema  Fentanyl     Iodine     Motrin [Ibuprofen] Swelling    Percocet [Oxycodone-Acetaminophen]     Shellfish-Derived Products     Toradol [Ketorolac Tromethamine]     Tramadol     Tylenol [Acetaminophen] Hives       CODE STATUS: full    PRIMARY CARE MD: Pcp, None, MD    Family History   Problem Relation Age of Onset    Myocardial Infarction Father 8    Deep vein thrombosis Father     No known problems Mother        Social History     Tobacco Use    Smoking status: Never Smoker    Smokeless tobacco: Never Used   Vaping Use    Vaping Use: Never used   Substance Use Topics    Alcohol use: No    Drug use: No       REVIEW OF SYSTEMS   Positive for: fever, wound drainage, paresthesias in chest  Negative for: cough sob chest pain diarrhea vomiting  All ROS completed and otherwise negative.    PHYSICAL EXAM   Vital Signs (most recent): BP 158/79    Pulse 71    Temp 98.4 F (36.9 C)  (Oral)    Resp 18    Ht 1.778 m (5\' 10" )    Wt 132.5 kg (292 lb)    LMP 10/15/2018 (Exact Date)    SpO2 98%    BMI 41.90 kg/m   Constitutional: No apparent distress. Patient speaks freely in full sentences.   HEENT: NC/AT, PERRL, no scleral icterus or conjunctival pallor, no nasal discharge, MMM, oropharynx without erythema or exudate  Neck: trachea midline, supple, no cervical or supraclavicular lymphadenopathy or masses  Cardiovascular: RRR, normal S1 S2, no murmurs, gallops, palpable thrills, no JVD, Non-displaced PMI.  Respiratory: Normal rate. No retractions or increased work of breathing. Clear to auscultation and percussion bilaterally.  Gastrointestinal: +BS, non-distended, soft, non-tender, no rebound or guarding, no hepatosplenomegaly  Genitourinary: no suprapubic or costovertebral angle tenderness  Musculoskeletal: ROM and motor strength grossly normal. No clubbing, edema, or cyanosis. DP and radial pulses 2+ and symmetric.  Skin exam:  no pallor. Wound dehiscence with clear drainage noted. No evidence of surrounding erythema or edema  Neurologic: EOMI, CN 2-12 grossly intact. no gross motor or sensory deficits  Psychiatric: AAOx3, affect and mood appropriate. The patient is alert, interactive, appropriate.  Capillary refill:  Normal    Exam done by Maple Mirza, MD on 10/15/19 at 5:51 PM      LABS & IMAGING     Recent Results (from the past 24 hour(s))   CBC and differential    Collection Time: 10/15/19  1:20 PM   Result Value Ref Range    WBC 6.53 3.1 - 9.5 x10 3/uL    Hgb 10.5 (L) 11.4 - 14.8 g/dL    Hematocrit 16.1 (L) 34.7 - 43.7 %    Platelets 178 142 - 346 x10 3/uL    RBC 4.05 3.90 - 5.10 x10 6/uL    MCV 84.4 78.0 - 96.0 fL    MCH 25.9 25.1 - 33.5 pg    MCHC 30.7 (L) 31 - 35 g/dL    RDW 16 (H) 09.6 - 04.5 %    MPV 12.6 (H) 8.9 - 12.5 fL    Neutrophils 65.3 None %    Lymphocytes Automated 23.1 None %    Monocytes 6.9 None %    Eosinophils Automated 4.0  None %    Basophils Automated 0.5 None %     Immature Granulocytes 0.2 None %    Nucleated RBC 0.0 0.0 - 0.0 /100 WBC    Neutrophils Absolute 4.27 1 - 6 x10 3/uL    Lymphocytes Absolute Automated 1.51 0.42 - 3.22 x10 3/uL    Monocytes Absolute Automated 0.45 0.21 - 0.85 x10 3/uL    Eosinophils Absolute Automated 0.26 0.00 - 0.44 x10 3/uL    Basophils Absolute Automated 0.03 0.00 - 0.08 x10 3/uL    Immature Granulocytes Absolute 0.01 0.00 - 0.07 x10 3/uL    Absolute NRBC 0.00 0.00 - 0.00 x10 3/uL   Comprehensive metabolic panel    Collection Time: 10/15/19  1:20 PM   Result Value Ref Range    Glucose 87 70 - 100 mg/dL    BUN 16.1 7 - 19 mg/dL    Creatinine 0.8 0.6 - 1.0 mg/dL    Sodium 096 045 - 409 mEq/L    Potassium 3.7 3.5 - 5.1 mEq/L    Chloride 107 100 - 111 mEq/L    CO2 26 22 - 29 mEq/L    Calcium 9.0 8.5 - 10.5 mg/dL    Protein, Total 7.8 6.0 - 8.3 g/dL    Albumin 3.4 (L) 3.5 - 5.0 g/dL    AST (SGOT) 18 5 - 34 U/L    ALT 25 0 - 55 U/L    Alkaline Phosphatase 94 37 - 106 U/L    Bilirubin, Total 0.8 0.2 - 1.2 mg/dL    Globulin 4.4 (H) 2.0 - 3.6 g/dL    Albumin/Globulin Ratio 0.8 (L) 0.9 - 2.2    Anion Gap 8.0 5 - 15   Lipase    Collection Time: 10/15/19  1:20 PM   Result Value Ref Range    Lipase 26 8 - 78 U/L   Troponin I    Collection Time: 10/15/19  1:20 PM   Result Value Ref Range    Troponin I <0.01 0.00 - 0.05 ng/mL   Beta HCG, Quant, Serum    Collection Time: 10/15/19  1:20 PM   Result Value Ref Range    hCG, Quant. 1.7 See below   Hemolysis index    Collection Time: 10/15/19  1:20 PM   Result Value Ref Range    Hemolysis Index 1 0 - 18   GFR    Collection Time: 10/15/19  1:20 PM   Result Value Ref Range    EGFR >60.0    UA Reflex to Micro - Reflex to Culture    Collection Time: 10/15/19  5:15 PM   Result Value Ref Range    Urine Type Urine, Clean Ca     Color, UA Yellow Colorless - Yellow    Clarity, UA Clear Clear - Hazy    Specific Gravity UA 1.018 1.001 - 1.035    Urine pH 6.0 5.0 - 8.0    Leukocyte Esterase, UA Trace (A) Negative    Nitrite,  UA Negative Negative    Protein, UR Negative Negative    Glucose, UA Negative Negative    Ketones UA Negative Negative    Urobilinogen, UA Negative 0.2 - 2.0 mg/dL    Bilirubin, UA Negative Negative    Blood, UA Negative Negative    RBC, UA 0 - 2 0 - 5 /hpf    WBC, UA 0 - 5 0 - 5 /hpf    Squamous Epithelial Cells, Urine 0 - 5 0 - 25 /hpf  Hyaline Casts, UA 0 - 3 0 - 5 /lpf    Urine Mucus Present None   Lactic Acid    Collection Time: 10/15/19  5:15 PM   Result Value Ref Range    Lactic Acid 0.9 0.2 - 2.0 mmol/L   COVID-19 (SARS-COV-2) ([facility])    Collection Time: 10/15/19  5:15 PM    Specimen: Nasopharynx; Nasopharyngeal Swab   Result Value Ref Range    Purpose of COVID testing Screening     SARS-CoV-2 Specimen Source Nasopharyngeal        MICROBIOLOGY:  Blood Culture: pending  Urine Culture: not done  Antibiotics Started: vanco ceftriaxone    IMAGING:  Upon my review: CT Abd/ Pelvis without Contrast    Result Date: 10/15/2019  Dehiscence superior margin of surgical wound with cellulitis. No abscess. Decrease in size seroma right pelvic sidewall. Recommendation: None. Adaline Sill, MD  10/15/2019 3:40 PM    XR Chest 2 Views    Result Date: 10/15/2019  No focal consolidation, pleural effusion, or pneumothorax. Raynelle Dick, MD  10/15/2019 12:31 PM      CARDIAC:  EKG Interpretation (upon my review):  NSR rate 84, qtc 472, no ischemic changes    Markers:  Recent Labs   Lab 10/15/19  1320   Troponin I <0.01       EMERGENCY DEPARTMENT COURSE:  Orders Placed This Encounter   Procedures    Wound culture & gram stain    Blood Culture #2    Blood Culture #1    COVID-19 (SARS-COV-2) ([facility])    XR Chest 2 Views    CT Abd/ Pelvis without Contrast    CBC and differential    Comprehensive metabolic panel    Lipase    Troponin I    Beta HCG, Quant, Serum    Hemolysis index    GFR    UA Reflex to Micro - Reflex to Culture    Lactic Acid    Basic Metabolic Panel    CBC and differential    Diet regular     Repeat VS After IV Bolus    Vital signs    Pulse Oximetry    Progressive Mobility Protocol    Notify physician    NSG Communication: Glucose POCT order (PRN hypoglycemia)    I/O    Height    Weight    Skin assessment    Nursing communication: Adult Hypoglycemia Treatment Algorithm    Place sequential compression device    Maintain sequential compression device    Education: Activity    Education: Disease Process & Condition    Education: Pain Management    Education: Falls Risk    Education: Smoking Cessation    Full Code    ED Unit Sec Comm Order    ED Unit Sec Comm Order    ECG 12 Lead    Saline lock IV    Saline lock IV #1    Saline lock IV    Admit to Observation       ASSESSMENT & PLAN     [name] Cook is a 44 y.o. female admitted under [facility] with wound infection.    Patient Active Hospital Problem List:    Wound dehiscence  Cellulitis  Fever  Failed outpatient antibiotics- including IV  Patient tells me she just finished 3 weeks of antibiotics for this- had midline with ceftriaxone, levaquin, doxy  CT confirms soft tissue infection and wound dehiscence  ER consulting surgery  Blood cultures pending, wound culture pending  Will give vanco/zosyn (greenish drainage)   Will add ID to the treatment team and consult in AM    Hypertensive urgency 186/96  Likely due in part to anxiety   Continue home hctz  Start amlodipine     H/o CVA  States she doesn't take aspirin any longer  Was initially on asa/plavix- and was still on these 02/2018 when I discharged her  Would recommend aspirin for her    Post op anticoagulation  Patient tells me that she has been on lovenox 40mg  SC daily since March hysterectomy  Recommend she discuss duration with surgeon    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

## 2019-10-15 NOTE — ED Triage Notes (Signed)
EMERGENCY DEPARTMENT PIT NOTE    Patient Name: [name] Cook has had a rapid medical screening evaluation initiated by myself for the chief complaint of central chest tightness/tingling, SOB, sweating at 5:30AM. 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 Pt states the woudn isn't healing and now has greeenish d/c. Pt took 2 days of doxycyline for poss wound infection. Fever 101F yesterday.      Vitals: BP (!) 172/113   Pulse 85   Temp 98.4 F (36.9 C) (Oral)   Resp 18   Ht 5\' 10"  (1.778 m)   Wt 132.5 kg   LMP 10/15/2018 (Exact Date)   SpO2 99%   BMI 41.90 kg/m   Pertinent brief exam: Non-toxic, well appearing in NAD. Heart RRR. Lungs CTA b/l. No resp distress. Abd with dressing over upper abd wound (dressing left on). Mild upper abd tenderness. No guarding.   Prelim orders: cbc, cmp, trop, ekg, cxr, wound culture    Patient advised to remain in the ED until further evaluation can be performed. Patient instructed to notify staff of any changes in condition while waiting.    I am not the sole provider and this assessment is only an initial evaluation prior to full evaluation to expedite care.

## 2019-10-15 NOTE — ED Provider Notes (Signed)
EMERGENCY DEPARTMENT HISTORY AND PHYSICAL EXAM     None            Provider Note:       R/o sbo, r/o uti, r/o electrolyte abn, r/o nstemi    Will admit for iv abx    Personal Protective Equipment (PPE)    Face Shield, Gloves, Goggles, N95, Pension scheme manager and Surgical / [employer] Cap      HISTORY OF PRESENT ILLNESS   Historian:Patient  Translator Used: No    44 y.o. female h/o hld, htn, seizure, stroke, tia s/p hysterectomy in 05/2019 here b/c of fever to 101 at home, abd pain, persistent open surgical wound on abdomen and distention. No other complaints.     1. Location of symptoms: abd  2. Onset of symptoms:  Past few days   3. What was patient doing when symptoms started (Context): s/p hysterectomy, open surgical wound   4. Severity: moderate  5. Timing: constant  6. Activities that worsen symptoms: none  7. Activities that improve symptoms: none  8. Quality:   9. Radiation of symptoms:  10. Associated signs and Symptoms: see above  11. Are symptoms worsening? Yes        MEDICAL HISTORY     Past Medical History:  Past Medical History:   Diagnosis Date    Hypercholesteremia     Hypertension     Seizures     Stroke     2015 and 2017, right side    TIA (transient ischemic attack) 2017       Past Surgical History:  Past Surgical History:   Procedure Laterality Date    APPENDECTOMY      CHOLECYSTECTOMY      EGD, BIOPSY N/A 04/16/2017    Procedure: EGD, BIOPSY;  Surgeon: Pershing Proud, MD;  Location: [facility];  Service: Gastroenterology;  Laterality: N/A;    HYSTERECTOMY         Social History:  Social History     Socioeconomic History    Marital status: Single     Spouse name: Not on file    Number of children: Not on file    Years of education: Not on file    Highest education level: Not on file   Occupational History    Not on file   Tobacco Use    Smoking status: Never Smoker    Smokeless tobacco: Never Used   Vaping Use    Vaping Use: Never used   Substance and Sexual Activity    Alcohol use:  No    Drug use: No    Sexual activity: Not on file   Other Topics Concern    Not on file   Social History Narrative    Not on file     Social Determinants of Health     Financial Resource Strain:     Difficulty of Paying Living Expenses:    Food Insecurity:     Worried About Programme researcher, broadcasting/film/video in the Last Year:     Barista in the Last Year:    Transportation Needs:     Freight forwarder (Medical):     Lack of Transportation (Non-Medical):    Physical Activity:     Days of Exercise per Week:     Minutes of Exercise per Session:    Stress:     Feeling of Stress :    Social Connections:     Frequency of Communication with Friends and  Family:     Frequency of Social Gatherings with Friends and Family:     Attends Religious Services:     Active Member of Clubs or Organizations:     Attends Engineer, structural:     Marital Status:    Intimate Partner Violence:     Fear of Current or Ex-Partner:     Emotionally Abused:     Physically Abused:     Sexually Abused:        Family History:  Family History   Problem Relation Age of Onset    Myocardial Infarction Father 45    Deep vein thrombosis Father     No known problems Mother        Allergies:  Allergies   Allergen Reactions    Contrast [Iodinated Diagnostic Agents] Anaphylaxis     Patient woke up in ICU after having CT with contrast, last remembers being in CT    Fioricet [Butalbital-Apap-Caffeine] Hives    Bentyl [Dicyclomine] Edema    Fentanyl     Iodine     Motrin [Ibuprofen] Swelling    Percocet [Oxycodone-Acetaminophen]     Shellfish-Derived Products     Toradol [Ketorolac Tromethamine]     Tramadol     Tylenol [Acetaminophen] Hives       Outpatient Medication:  Current Discharge Medication List      CONTINUE these medications which have NOT CHANGED    Details   LevETIRAcetam (KEPPRA PO) Take 500 mg by mouth 2 (two) times daily.         lisinopril (PRINIVIL,ZESTRIL) 40 MG tablet Take 40 mg by mouth daily.       pantoprazole (PROTONIX) 40 MG tablet Take 1 tablet (40 mg total) by mouth daily  Qty: 30 tablet, Refills: 0      aspirin 81 MG chewable tablet Chew 1 tablet (81 mg total) by mouth daily  Qty: 30 tablet, Refills: 0      atorvastatin (LIPITOR) 40 MG tablet Take by mouth.      enoxaparin (LOVENOX) 40 MG/0.4ML Solution Inject 40 mg into the skin every 24 hours      hydroCHLOROthiazide (HYDRODIURIL) 25 MG tablet Take 25 mg by mouth daily      ondansetron (ZOFRAN-ODT) 4 MG disintegrating tablet Take 1 tablet (4 mg total) by mouth every 6 (six) hours as needed for Nausea  Qty: 20 tablet, Refills: 0    Comments: Patient tolerates dilaudid pills well without issues.      topiramate (TOPAMAX) 50 MG tablet Take 0.5 tablets (25 mg total) by mouth nightly  Qty: 30 tablet, Refills: 0               REVIEW OF SYSTEMS   ADD ROS  Review of Systems   Gastrointestinal: Positive for abdominal pain.   Skin:        Surgical wound    All other systems reviewed and are negative.    PHYSICAL EXAM     Vitals:    10/15/19 1203   BP: (!) 172/113   Pulse: 85   Resp: 18   Temp: 98.4 F (36.9 C)   SpO2: 99%     Nursing note and vitals reviewed.  Constitutional:  Well developed, well nourished. High bmi.   Head:  Atraumatic. Normocephalic.    Eyes:  PERRL. EOMI. Conjunctivae are not pale.  ENT:  Mucous membranes are moist and intact. Patent airway.  Neck:  Supple. Full ROM.    Cardiovascular:  Regular rate.  Regular rhythm.   Respiratory:  No evidence of respiratory distress.   GI:  Soft and non-distended. There is no tenderness. No rebound, guarding, or rigidity.  Back:  Full ROM. Nontender.  MSK:  No edema. No cyanosis. No clubbing. Full range of motion in all extremities.  Skin:  Skin is warm and dry.  No diaphoresis. No rash. Open surgical wound, no active drainage, tender, wound around umbilicus.   Neurological:  Alert, awake, and appropriate. Normal speech. Motor normal.  Psychiatric:  Good eye contact. Normal interaction, affect, and  behavior.          MEDICAL DECISION MAKING     Vital Signs: Reviewed the patients vital signs.   Nursing Notes: Reviewed and utilized available nursing notes.  Medical Records Reviewed: Reviewed available past medical records.      PROCEDURES      EKG Interpretation:    12:05 NSR at 84 bpm, NAD, no LVH, qrs 78, qtc 472 (-) ST-T changes similar to ekg on 10/29/2018 as read by me.     IMAGING STUDIES      CT Abd/ Pelvis without Contrast   Final Result      Dehiscence superior margin of surgical wound with cellulitis. No   abscess.      Decrease in size seroma right pelvic sidewall.      Recommendation:   None.      Adaline Sill, MD    10/15/2019 3:40 PM      XR Chest 2 Views   Final Result         No focal consolidation, pleural effusion, or pneumothorax.      Raynelle Dick, MD    10/15/2019 12:31 PM             PULSE OXIMETRY    Oxygen Saturation by Pulse Oximetry: 97%  Interventions: none  Interpretation: normal     EMERGENCY DEPT. MEDICATIONS      ED Medication Orders (From admission, onward)    Start Ordered     Status Ordering Provider    10/15/19 2100 10/15/19 1803  piperacillin-tazobactam (ZOSYN) 4.5 g in sodium chloride 0.9 % 100 mL IVPB mini-bag plus  Every 6 hours     Route: Intravenous  Ordered Dose: 4.5 g     Last MAR action: New Bag Scarlette Ar E    10/15/19 2000 10/15/19 1905    Once in ED     Route: Intravenous  Ordered Dose: 4.5 g     Discontinued Scarlette Ar E    10/15/19 1813 10/15/19 1812    Once     Route: Intravenous  Ordered Dose: 2 g     Discontinued Scarlette Ar E    10/15/19 1811 10/15/19 1810  lactobacillus/streptococcus (RISAQUAD) capsule 1 capsule  Daily     Route: Oral  Ordered Dose: 1 capsule     Acknowledged Scarlette Ar E    10/15/19 1810 10/15/19 1809  amLODIPine (NORVASC) tablet 5 mg  Daily     Route: Oral  Ordered Dose: 5 mg     Acknowledged Scarlette Ar E    10/15/19 1804 10/15/19 1803  enoxaparin (LOVENOX) syringe 40 mg  Daily     Route: Subcutaneous  Ordered Dose: 40  mg     Acknowledged Scarlette Ar E    10/15/19 1801 10/15/19 1803  naloxone (NARCAN) injection 0.2 mg  As needed     Route: Intravenous  Ordered Dose: 0.2 mg     Acknowledged Jane, Dois Davenport  E    10/15/19 1801 10/15/19 1803  melatonin tablet 3 mg  At bedtime PRN     Route: Oral  Ordered Dose: 3 mg     Acknowledged Scarlette Ar E    10/15/19 1801 10/15/19 1803  dextrose (GLUCOSE) 40 % oral gel 15 g of glucose  As needed     Route: Oral  Ordered Dose: 15 g of glucose     Acknowledged Scarlette Ar E    10/15/19 1801 10/15/19 1803  dextrose 50 % bolus 12.5 g  As needed     Route: Intravenous  Ordered Dose: 12.5 g     Acknowledged Scarlette Ar E    10/15/19 1801 10/15/19 1803  glucagon (rDNA) (GLUCAGEN) injection 1 mg  As needed     Route: Intramuscular  Ordered Dose: 1 mg     Acknowledged Scarlette Ar E    10/15/19 1713 10/15/19 1712  vancomycin (VANCOCIN) 1,750 mg in sodium chloride 0.9 % 500 mL IVPB  Once     Route: Intravenous  Ordered Dose: 1,750 mg     Last MAR action: New Bag Avarie Tavano SHU    10/15/19 1705 10/15/19 1704  morphine injection 4 mg  Once     Route: Intravenous  Ordered Dose: 4 mg     Last MAR action: Given Nigel Ericsson SHU    10/15/19 1705 10/15/19 1704  sodium chloride 0.9 % bolus 1,000 mL  Once     Route: Intravenous  Ordered Dose: 1,000 mL     Last MAR action: New Bag Arshia Rondon SHU    10/15/19 1705 10/15/19 1704    Every 24 hours     Route: Intravenous  Ordered Dose: 2 g     Discontinued Myka Hitz SHU    10/15/19 1705 10/15/19 1704    Once     Route: Intravenous  Ordered Dose: 1 g     Discontinued Heiley Shaikh SHU    10/15/19 1421 10/15/19 1420  morphine injection 4 mg  Once     Route: Intravenous  Ordered Dose: 4 mg     Last MAR action: Given Elliette Seabolt SHU    10/15/19 1421 10/15/19 1420  ondansetron (ZOFRAN) injection 4 mg  Once     Route: Intravenous  Ordered Dose: 4 mg     Last MAR action: Given Dossie Swor SHU          LABORATORY RESULTS    Ordered and independently interpreted  AVAILABLE laboratory tests. Please see results section in chart for full details.  Results for orders placed or performed during the hospital encounter of 10/15/19   COVID-19 (SARS-COV-2) ([facility])    Specimen: Nasopharynx; Nasopharyngeal Swab   Result Value Ref Range    Purpose of COVID testing Screening     SARS-CoV-2 Specimen Source Nasopharyngeal     SARS CoV 2 Overall Result Negative    CBC and differential   Result Value Ref Range    WBC 6.53 3.1 - 9.5 x10 3/uL    Hgb 10.5 (L) 11.4 - 14.8 g/dL    Hematocrit 24.4 (L) 34.7 - 43.7 %    Platelets 178 142 - 346 x10 3/uL    RBC 4.05 3.90 - 5.10 x10 6/uL    MCV 84.4 78.0 - 96.0 fL    MCH 25.9 25.1 - 33.5 pg    MCHC 30.7 (L) 31 - 35 g/dL    RDW 16 (H) 01.0 - 27.2 %    MPV 12.6 (  H) 8.9 - 12.5 fL    Neutrophils 65.3 None %    Lymphocytes Automated 23.1 None %    Monocytes 6.9 None %    Eosinophils Automated 4.0 None %    Basophils Automated 0.5 None %    Immature Granulocytes 0.2 None %    Nucleated RBC 0.0 0.0 - 0.0 /100 WBC    Neutrophils Absolute 4.27 1 - 6 x10 3/uL    Lymphocytes Absolute Automated 1.51 0.42 - 3.22 x10 3/uL    Monocytes Absolute Automated 0.45 0.21 - 0.85 x10 3/uL    Eosinophils Absolute Automated 0.26 0.00 - 0.44 x10 3/uL    Basophils Absolute Automated 0.03 0.00 - 0.08 x10 3/uL    Immature Granulocytes Absolute 0.01 0.00 - 0.07 x10 3/uL    Absolute NRBC 0.00 0.00 - 0.00 x10 3/uL   Comprehensive metabolic panel   Result Value Ref Range    Glucose 87 70 - 100 mg/dL    BUN 16.1 7 - 19 mg/dL    Creatinine 0.8 0.6 - 1.0 mg/dL    Sodium 096 045 - 409 mEq/L    Potassium 3.7 3.5 - 5.1 mEq/L    Chloride 107 100 - 111 mEq/L    CO2 26 22 - 29 mEq/L    Calcium 9.0 8.5 - 10.5 mg/dL    Protein, Total 7.8 6.0 - 8.3 g/dL    Albumin 3.4 (L) 3.5 - 5.0 g/dL    AST (SGOT) 18 5 - 34 U/L    ALT 25 0 - 55 U/L    Alkaline Phosphatase 94 37 - 106 U/L    Bilirubin, Total 0.8 0.2 - 1.2 mg/dL    Globulin 4.4 (H) 2.0 - 3.6 g/dL    Albumin/Globulin Ratio 0.8 (L) 0.9 - 2.2     Anion Gap 8.0 5 - 15   Lipase   Result Value Ref Range    Lipase 26 8 - 78 U/L   Troponin I   Result Value Ref Range    Troponin I <0.01 0.00 - 0.05 ng/mL   Beta HCG, Quant, Serum   Result Value Ref Range    hCG, Quant. 1.7 See below   Hemolysis index   Result Value Ref Range    Hemolysis Index 1 0 - 18   GFR   Result Value Ref Range    EGFR >60.0    UA Reflex to Micro - Reflex to Culture   Result Value Ref Range    Urine Type Urine, Clean Ca     Color, UA Yellow Colorless - Yellow    Clarity, UA Clear Clear - Hazy    Specific Gravity UA 1.018 1.001 - 1.035    Urine pH 6.0 5.0 - 8.0    Leukocyte Esterase, UA Trace (A) Negative    Nitrite, UA Negative Negative    Protein, UR Negative Negative    Glucose, UA Negative Negative    Ketones UA Negative Negative    Urobilinogen, UA Negative 0.2 - 2.0 mg/dL    Bilirubin, UA Negative Negative    Blood, UA Negative Negative    RBC, UA 0 - 2 0 - 5 /hpf    WBC, UA 0 - 5 0 - 5 /hpf    Squamous Epithelial Cells, Urine 0 - 5 0 - 25 /hpf    Hyaline Casts, UA 0 - 3 0 - 5 /lpf    Urine Mucus Present None   Lactic Acid   Result Value Ref Range  Lactic Acid 0.9 0.2 - 2.0 mmol/L       CLINICAL DECISION SUPPORT         ED Course      Paged sound     D/w Dr. Wynelle Bourgeois, sound, will admit.     D/w Dr. Yisroel Ramming, recommend, plastic consult    D/w  Dr. Tilford Pillar plastics, will consult.         ATTESTATIONS      Physician Attestation: Cecilie Kicks Ramsay Bognar , have been the primary provider for [name] during this Emergency Dept visit and have reviewed the chart documented for accuracy and agree with its content.       DIAGNOSIS      Diagnosis:  Final diagnoses:   Cellulitis of other specified site   Hypertensive urgency       Disposition:  ED Disposition     ED Disposition Condition Date/Time Comment    Observation  Wed Oct 15, 2019  5:24 PM Admitting Physician: Jolyn Lent [[id number]]   Service:: Medicine [[id number]]   Estimated Length of Stay: < 2 midnights   Tentative Discharge Plan?: Home  or Self Care [1]   Does patient need telemetry?: No            Prescriptions:  Current Discharge Medication List      CONTINUE these medications which have NOT CHANGED    Details   LevETIRAcetam (KEPPRA PO) Take 500 mg by mouth 2 (two) times daily.         lisinopril (PRINIVIL,ZESTRIL) 40 MG tablet Take 40 mg by mouth daily.      pantoprazole (PROTONIX) 40 MG tablet Take 1 tablet (40 mg total) by mouth daily  Qty: 30 tablet, Refills: 0      aspirin 81 MG chewable tablet Chew 1 tablet (81 mg total) by mouth daily  Qty: 30 tablet, Refills: 0      atorvastatin (LIPITOR) 40 MG tablet Take by mouth.      enoxaparin (LOVENOX) 40 MG/0.4ML Solution Inject 40 mg into the skin every 24 hours      hydroCHLOROthiazide (HYDRODIURIL) 25 MG tablet Take 25 mg by mouth daily      ondansetron (ZOFRAN-ODT) 4 MG disintegrating tablet Take 1 tablet (4 mg total) by mouth every 6 (six) hours as needed for Nausea  Qty: 20 tablet, Refills: 0    Comments: Patient tolerates dilaudid pills well without issues.      topiramate (TOPAMAX) 50 MG tablet Take 0.5 tablets (25 mg total) by mouth nightly  Qty: 30 tablet, Refills: 0                    Brittian Renaldo, Zadie Rhine, MD PhD  10/15/19 2257

## 2019-10-15 NOTE — Consults (Signed)
Full consult to follow    [name] [facility] [name] Encarnacion, MD

## 2019-10-15 NOTE — ED Notes (Signed)
[name]  ED NURSING NOTE FOR THE RECEIVING INPATIENT NURSE   ED NURSE Francis Dowse [phone]   ED CHARGE RN [phone]   ADMISSION INFORMATION   [name] Cook is a 44 y.o. female admitted with a diagnosis of:    1. Cellulitis of other specified site    2. Hypertensive urgency         Isolation: None   Allergies: Contrast [iodinated diagnostic agents], Fioricet [butalbital-apap-caffeine], Bentyl [dicyclomine], Fentanyl, Iodine, Motrin [ibuprofen], Percocet [oxycodone-acetaminophen], Shellfish-derived products, Toradol [ketorolac tromethamine], Tramadol, and Tylenol [acetaminophen]   Holding Orders confirmed? Yes   Belongings Documented? Yes   Home medications sent to pharmacy confirmed? No   NURSING CARE   Patient Comes From:   Mental Status: Home Independent  alert and oriented   ADL: Independent with all ADLs   Ambulation: no difficulty   Pertinent Information  and Safety Concerns: Pt had hyserectomy in March, has possible infection around wound     COVID Test sent to lab? Yes   VITAL SIGNS   Time BP Temp Pulse Resp SpO2   1701 158/79 98.4 71 18 98   CT / NIH   CT Head ordered on this patient?  No   NIH/Dysphagia assessment done prior to admission? No   PERSONAL PROTECTIVE EQUIPMENT   Gloves and Goggles   LAB RESULTS   Labs Reviewed   CBC AND DIFFERENTIAL - Abnormal; Notable for the following components:       Result Value    Hgb 10.5 (*)     Hematocrit 34.2 (*)     MCHC 30.7 (*)     RDW 16 (*)     MPV 12.6 (*)     All other components within normal limits   COMPREHENSIVE METABOLIC PANEL - Abnormal; Notable for the following components:    Albumin 3.4 (*)     Globulin 4.4 (*)     Albumin/Globulin Ratio 0.8 (*)     All other components within normal limits   COVID-19 (SARS-COV-2)    Narrative:     o Collect and clearly label specimen type:  o Upper respiratory specimen: One Nasopharyngeal Dry Swab NO  Transport Media.  o Hand deliver to laboratory ASAP  Indication for testing->Extended care  facility admission to  semi private room  Screening   CULTURE + GRAM STAIN,AEROBIC, WOUND   CULTURE BLOOD AEROBIC AND ANAEROBIC    Narrative:     1 BLUE+1 PURPLE   CULTURE BLOOD AEROBIC AND ANAEROBIC    Narrative:     1 BLUE+1 PURPLE   LIPASE   TROPONIN I   HCG QUANTITATIVE   HEMOLYSIS INDEX   GFR   LACTIC ACID, PLASMA   URINALYSIS REFLEX TO MICROSCOPIC EXAM - REFLEX TO CULTURE          Ticket to Ride Printed: Yes

## 2019-10-16 LAB — CBC AND DIFFERENTIAL
Absolute NRBC: 0 10*3/uL (ref 0.00–0.00)
Basophils Absolute Automated: 0.03 10*3/uL (ref 0.00–0.08)
Basophils Automated: 0.5 %
Eosinophils Absolute Automated: 0.25 10*3/uL (ref 0.00–0.44)
Eosinophils Automated: 4.3 %
Hematocrit: 32.2 % — ABNORMAL LOW (ref 34.7–43.7)
Hgb: 9.8 g/dL — ABNORMAL LOW (ref 11.4–14.8)
Immature Granulocytes Absolute: 0.02 10*3/uL (ref 0.00–0.07)
Immature Granulocytes: 0.3 %
Lymphocytes Absolute Automated: 1.12 10*3/uL (ref 0.42–3.22)
Lymphocytes Automated: 19.1 %
MCH: 25.9 pg (ref 25.1–33.5)
MCHC: 30.4 g/dL — ABNORMAL LOW (ref 31.5–35.8)
MCV: 85 fL (ref 78.0–96.0)
MPV: 13.2 fL — ABNORMAL HIGH (ref 8.9–12.5)
Monocytes Absolute Automated: 0.51 10*3/uL (ref 0.21–0.85)
Monocytes: 8.7 %
Neutrophils Absolute: 3.92 10*3/uL (ref 1.10–6.33)
Neutrophils: 67.1 %
Nucleated RBC: 0 /100 WBC (ref 0.0–0.0)
Platelets: 149 10*3/uL (ref 142–346)
RBC: 3.79 10*6/uL — ABNORMAL LOW (ref 3.90–5.10)
RDW: 16 % — ABNORMAL HIGH (ref 11–15)
WBC: 5.85 10*3/uL (ref 3.10–9.50)

## 2019-10-16 LAB — BASIC METABOLIC PANEL
Anion Gap: 7 (ref 5.0–15.0)
BUN: 12 mg/dL (ref 7.0–19.0)
CO2: 27 mEq/L (ref 22–29)
Calcium: 8.4 mg/dL — ABNORMAL LOW (ref 8.5–10.5)
Chloride: 106 mEq/L (ref 100–111)
Creatinine: 0.9 mg/dL (ref 0.6–1.0)
Glucose: 123 mg/dL — ABNORMAL HIGH (ref 70–100)
Potassium: 3.6 mEq/L (ref 3.5–5.1)
Sodium: 140 mEq/L (ref 136–145)

## 2019-10-16 LAB — ECG 12-LEAD
Atrial Rate: 84 {beats}/min
P Axis: 36 degrees
P-R Interval: 142 ms
Q-T Interval: 400 ms
QRS Duration: 78 ms
QTC Calculation (Bezet): 472 ms
R Axis: 1 degrees
T Axis: 4 degrees
Ventricular Rate: 84 {beats}/min

## 2019-10-16 LAB — HEMOLYSIS INDEX: Hemolysis Index: 0 (ref 0–18)

## 2019-10-16 LAB — GFR: EGFR: >60

## 2019-10-16 MED ORDER — HYDROMORPHONE HCL 2 MG PO TABS
2.0000 mg | ORAL_TABLET | Freq: Once | ORAL | Status: AC
Start: 2019-10-16 — End: 2019-10-16
  Administered 2019-10-16: 01:00:00 2 mg via ORAL
  Filled 2019-10-16: qty 1

## 2019-10-16 MED ORDER — SODIUM HYPOCHLORITE 0.125 % EX SOLN
Freq: Every day | CUTANEOUS | Status: DC
Start: 2019-10-16 — End: 2019-10-17
  Filled 2019-10-16: qty 473

## 2019-10-16 MED ORDER — LIDOCAINE 5 % EX PTCH
1.0000 | MEDICATED_PATCH | CUTANEOUS | Status: DC
Start: 2019-10-17 — End: 2019-10-17
  Filled 2019-10-16: qty 1

## 2019-10-16 MED ORDER — DIPHENHYDRAMINE HCL 25 MG PO CAPS
25.0000 mg | ORAL_CAPSULE | Freq: Four times a day (QID) | ORAL | Status: DC | PRN
Start: 2019-10-16 — End: 2019-10-17
  Administered 2019-10-16: 13:00:00 25 mg via ORAL
  Filled 2019-10-16: qty 1

## 2019-10-16 MED ORDER — SODIUM HYPOCHLORITE 0.125 % EX SOLN
Freq: Every day | CUTANEOUS | Status: DC
Start: 2019-10-16 — End: 2019-10-16
  Filled 2019-10-16: qty 473

## 2019-10-16 NOTE — UM Notes (Signed)
[name] Cook OF [employer] [facility] [employer]      Copy of Admission Order:  Admit to Observation (Order [id number]) 10/15/19 1724  Diagnosis: Cellulitis Of Other Specified Site    Level of Care: Acute    Patient Class: Observation       References: IAH Bed Placement CriteriaIFMC Bed Placement CriteriaIFOH Bed Placement CriteriaILH Bed Placement CriteriaIMVH Bed Placement Criteria   Question Answer Comment   Admitting Physician Garlan Fillers T    Service: Medicine    Estimated Length of Stay < 2 midnights    Tentative Discharge Plan? Home or Self Care    Does patient need telemetry? No             DIAGNOSIS    ICD-10-CM    1. Cellulitis of other specified site  L03.818     abdomen   2. Hypertensive urgency  I16.0        ADMITTED ON: 10/15/2019 at 1306 for Observation      PMH:  has a past medical history of Hypercholesteremia, Hypertension, Seizures, Stroke, and TIA (transient ischemic attack) (2017).    Reason For Hospitalization:    Pt is a 44 y.o. female who arrived to the ER with C/O Chest Pain, Wound Check, and Abdominal Pain here b/c of fever to 101 at home, abd pain, persistent open surgical wound on abdomen and distention. No other complaints      Vitals:   Patient Vitals for the past 24 hrs:   BP Temp Temp src Pulse Resp SpO2 Height Weight   10/16/19 0746 121/78 98.1 F (36.7 C) Oral 73 18 98 %     10/16/19 0334 121/74 97.9 F (36.6 C) Oral 69 17 96 %  139.3 kg (307 lb 1.6 oz)   10/15/19 2339 (!) 140/96 97.9 F (36.6 C) Oral 67 17 96 %     10/15/19 1945 (!) 146/94 97.7 F (36.5 C) Oral 64 19 97 % 1.778 m (5\' 10" ) 139.3 kg (307 lb)   10/15/19 1901 (!) 186/91   87  96 %     10/15/19 1801 167/79   66  96 %     10/15/19 1701 158/79   71  98 %     10/15/19 1500 (!) 186/96   63  96 %     10/15/19 1203 (!) 172/113 98.4 F (36.9 C) Oral 85 18 99 % 1.778 m (5\' 10" ) 132.5 kg (292 lb)         Temp:  [97.7 F (36.5 C)-98.4 F (36.9 C)]   Heart Rate:  [63-87]   Resp Rate:  [17-19]    BP: (121-186)/(74-113)   SpO2:  [96 %-99 %]   Height:  [177.8 cm (5\' 10" )]   Weight:  [132.5 kg (292 lb)-139.3 kg (307 lb 1.6 oz)]   BMI (calculated):  [42-44.1]     Last recorded pain score:  Pain Scale Used: Numeric Scale (0-10)  Pain Score: 8-severe pain     EKG:   EKG Interpretation:  12:05 NSR at 84 bpm, NAD, no LVH, qrs 78, qtc 472 (-) ST-T changes similar to ekg on 10/29/2018 as read by me.     Abnormal Labs:    Lab Results last 48 Hours     Procedure Component Value Units Date/Time    Basic Metabolic Panel [[id number]]  (Abnormal) Collected: 10/16/19 0339    Specimen: Blood Updated: 10/16/19 0449     Glucose 123 mg/dL      Calcium 8.4 mg/dL  CBC and differential [[id number]]  (Abnormal) Collected: 10/16/19 0339     Hgb 9.8 g/dL      Hematocrit 08.6 %      RBC 3.79 x10 6/uL      MCHC 30.4 g/dL      RDW 16 %      MPV 13.2 fL     COVID-19 (SARS-COV-2) ([facility]) [[id number]] Collected: 10/15/19 1715    Specimen: Nasopharyngeal Swab from Nasopharynx Updated: 10/15/19 1806     Purpose of COVID testing Screening     SARS-CoV-2 Specimen Source Nasopharyngeal     SARS CoV 2 Overall Result Negative    UA Reflex to Micro - Reflex to Culture [[id number]]  (Abnormal) Collected: 10/15/19 1715     Leukocyte Esterase, UA Trace    Troponin I [[id number]] Collected: 10/15/19 1320    Specimen: Blood Updated: 10/15/19 1351     Troponin I <0.01 ng/mL     Comprehensive metabolic panel [[id number]]  (Abnormal) Collected: 10/15/19 1320     Albumin 3.4 g/dL      Globulin 4.4 g/dL      Albumin/Globulin Ratio 0.8    CBC and differential [[id number]]  (Abnormal) Collected: 10/15/19 1320     Hgb 10.5 g/dL      Hematocrit 43.3 %      MCHC 30.7 g/dL      RDW 16 %      MPV 12.6 fL             Radiologic Exams:   CT Abd/ Pelvis without Contrast    Result Date: 10/15/2019  Dehiscence superior margin of surgical wound with cellulitis. No abscess. Decrease in size seroma right pelvic sidewall. Recommendation: None. Adaline Sill, MD  10/15/2019  3:40 PM    XR Chest 2 Views    Result Date: 10/15/2019  No focal consolidation, pleural effusion, or pneumothorax. Raynelle Dick, MD  10/15/2019 12:31 PM        ED meds:   Medication Administration from 10/15/2019 1142 to 10/15/2019 1936   Date/Time Order Dose Route Action Action by Comments    10/15/2019 1437 morphine injection 4 mg 4 mg Intravenous Given Samuel Germany, RN     10/15/2019 1437 ondansetron (ZOFRAN) injection 4 mg 4 mg Intravenous Given Samuel Germany, RN     10/15/2019 1717 morphine injection 4 mg 4 mg Intravenous Given Samuel Germany, RN     10/15/2019 1718 sodium chloride 0.9 % bolus 1,000 mL 1,000 mL Intravenous [address] Samuel Germany, RN     10/15/2019 1717 cefTRIAXone (ROCEPHIN) 2 g in sterile water (preservative free) 10 mL IV push injection 2 g Intravenous Given Samuel Germany, RN     10/15/2019 1819 vancomycin (VANCOCIN) 1,750 mg in sodium chloride 0.9 % 500 mL IVPB 1,750 mg Intravenous [address] Samuel Germany, RN     10/15/2019 1804 enoxaparin (LOVENOX) syringe 40 mg 0 mg Subcutaneous Hold Gaspar Cola, RN     10/15/2019 1810 lactobacillus/streptococcus (RISAQUAD) capsule 1 capsule 0 capsule Oral Hold Gaspar Cola, RN        Treatment Plan:   H&P 10/15/2019  ASSESSMENT & PLAN  [name] Cook is a 44 y.o. female admitted under [facility] with wound infection.  Patient Active Hospital Problem List:  Wound dehiscence  Cellulitis  Fever  Failed outpatient antibiotics- including IV  Patient tells me she just finished 3 weeks of antibiotics for this- had midline with ceftriaxone, levaquin, doxy  CT confirms soft tissue infection and wound dehiscence  ER consulting surgery  Blood cultures pending, wound culture pending  Will give vanco/zosyn (greenish drainage)   Will add ID to the treatment team and consult in AM  Hypertensive urgency 186/96  Likely due in part to anxiety   Continue home hctz  Start amlodipine   H/o CVA  States she doesn't take aspirin any longer  Was initially on asa/plavix- and was still on  these 02/2018 when I discharged her  Would recommend aspirin for her  Post op anticoagulation  Patient tells me that she has been on lovenox 40mg  SC daily since March hysterectomy  Recommend she discuss duration with surgeon  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  Keppra  Obesity BMI 42  Lifestyle modification  H/o admissions for chest pain, stroke like symptoms  Per neuro consult 01/2019 symptoms/exam and admissions suggestive of conversion disorder  Nutrition  reg  DVT/VTE Prophylaxis  lovenox  Anticipated medical stability for discharge: 1-2 days  Service status/Reason for ongoing hospitalization: abd skin infection  Anticipated Discharge Needs: none      Orders  Labs, VS, SCD's, I/O's, SCD's, glucose poct,     Scheduled Meds:  Current Facility-Administered Medications   Medication Dose Route Frequency    amLODIPine  5 mg Oral Daily    aspirin  81 mg Oral Daily    atorvastatin  40 mg Oral QHS    enoxaparin  40 mg Subcutaneous Daily    hydroCHLOROthiazide  25 mg Oral Daily    lactobacillus/streptococcus  1 capsule Oral Daily    levETIRAcetam  500 mg Oral Q12H SCH    lisinopril  40 mg Oral Daily    pantoprazole  40 mg Oral QAM AC    piperacillin-tazobactam  4.5 g Intravenous Q6H     Continuous Infusions:  PRN Meds:.Nursing communication: Adult Hypoglycemia Treatment Algorithm **AND** dextrose **AND** dextrose **AND** glucagon (rDNA), melatonin, morphine, naloxone, ondansetron        This clinical review is based on compiled documentation provided by the treatment team within the patient's medical record.  ________________________________________      UTILIZATION REVIEW CONTACT : Eddie North, MSN, RN, ACM  Utilization Review Case Manager II / Case Management  [facility]  [address]  [place], [place] [address]  T [phone]  M [phone]   F [phone]    Email: [email or web address]  NPI: [id number] Tax ID:  [phone]

## 2019-10-16 NOTE — Consults (Addendum)
Infectious Diseases and Tropical Medicine Consult  Tenna Child, MD          Date Time: 10/16/19 9:17 AM  Patient Name: [name] Cook  Referring Physician: Maple Mirza, MD      Reason for Consultation:      Abdominal wall wound    Assessment:      S/p abdominal hysterectomy (05/2019)   Nonhealing lower abdominal wall wound   No clear signs of an infection   Generalized anxiety disorder   Seizure disorder   History of CVA   No fever or leukocytosis    Recommendations:      Continue current antibiotics while awaiting wound cultures   Plastic surgery following   Continue local wound care   Probiotics    Monitor electrolytes and renal functions closely   Monitor clinically       Discussed with patient in detail   Discussed with wound care nurse             Discussed with Dr. Doyle Askew                                                 History of Present Illness:     Ms. [name] Cook is a 44 year old female with a history of CVA, seizure disorder, nonhealing abdominal wound after total abdominal [facility], recently completed 3 weeks of IV antibiotics is admitted since 10/15/2019 with worsening wound greenish drainage and subjective fevers.  No chest pain cough or shortness of breath but complains of getting anxious regarding the wound.  No abdominal pain, nausea, vomiting or diarrhea.  In the emergency room patient's temperature is 98.4, blood pressure 172/113, pulse is 85 bpm, leukocyte count is 6.5, wound and blood cultures were ordered and are pending and CT scan of abdomen and pelvis shows dehiscence of upper margin of surgical wound but no drainable abscess, decrease in size of right pelvic sidewall seroma.  Patient is started on Zosyn and also has received 1 dose of vancomycin.    Past Medical History:   Diagnosis Date    Hypercholesteremia     Hypertension     Seizures     Stroke     2015 and 2017, right side    TIA (transient ischemic attack) 2017         Past Surgical History:   Procedure Laterality Date    APPENDECTOMY      CHOLECYSTECTOMY      EGD, BIOPSY N/A 04/16/2017    Procedure: EGD, BIOPSY;  Surgeon: Pershing Proud, MD;  Location: [facility];  Service: Gastroenterology;  Laterality: N/A;    HYSTERECTOMY          Family History   Problem Relation Age of Onset    Myocardial Infarction Father 17    Deep vein thrombosis Father     No known problems Mother         Social History     Socioeconomic History    Marital status: Single     Spouse name: None    Number of children: None    Years of education: None    Highest education level: None   Occupational History    None   Tobacco Use    Smoking status: Never Smoker    Smokeless tobacco: Never Used   Vaping Use  Vaping Use: Never used   Substance and Sexual Activity    Alcohol use: No    Drug use: No    Sexual activity: None   Other Topics Concern    None   Social History Narrative    None     Social Determinants of Health     Financial Resource Strain:     Difficulty of Paying Living Expenses:    Food Insecurity:     Worried About Programme researcher, broadcasting/film/video in the Last Year:     Barista in the Last Year:    Transportation Needs:     Freight forwarder (Medical):     Lack of Transportation (Non-Medical):    Physical Activity:     Days of Exercise per Week:     Minutes of Exercise per Session:    Stress:     Feeling of Stress :    Social Connections:     Frequency of Communication with Friends and Family:     Frequency of Social Gatherings with Friends and Family:     Attends Religious Services:     Active Member of Clubs or Organizations:     Attends Banker Meetings:     Marital Status:    Intimate Partner Violence:     Fear of Current or Ex-Partner:     Emotionally Abused:     Physically Abused:     Sexually Abused:         Allergies:     Allergies   Allergen Reactions    Contrast [Iodinated Diagnostic Agents] Anaphylaxis     Patient woke up in  ICU after having CT with contrast, last remembers being in CT    Fioricet [Butalbital-Apap-Caffeine] Hives    Bentyl [Dicyclomine] Edema    Fentanyl     Iodine     Motrin [Ibuprofen] Swelling    Percocet [Oxycodone-Acetaminophen]     Shellfish-Derived Products     Toradol [Ketorolac Tromethamine]     Tramadol     Tylenol [Acetaminophen] Hives        Review of Systems:     General:  Comfortable, no acute distress, complains of subjective fevers  HEENT: No runny nose or sore throat  Respiratory: no cough or shortness of breath, no hematemesis or hemoptysis  Cardiac: no chest pain  Abdomen: no abdominal pain, no nausea vomiting or diarrhea, abdominal wall wound  Neurologic: awake and alert  Extremities: no joint pains or swelling  Genitourinary: No dysuria or hematuria  Dermatologic: no skin rashes, no itching    Physical Exam:     Blood pressure 121/78, pulse 73, temperature 98.1 F (36.7 C), temperature source Oral, resp. rate 18, height 1.778 m (5\' 10" ), weight 139.3 kg (307 lb 1.6 oz), last menstrual period 10/15/2018, SpO2 98 %.    General Appearance: Comfortable, well-appearing and in no acute distress.    HEENT:  Head is normocephalic, atraumatic, pupils are equal and reactive to light  Neck:    Supple,No neck lymphadenopathy, no thyromegaly  Lungs:  Clear to auscultation   Chest Wall: Symmetric chest wall expansion.   Heart : Regular rate and rhythm, no murmur or gallop  Abdomen: Abdomen is soft, nontender, good bowel sounds, no hepatosplenomegaly, abdominal wall quarter size open wound, no erythema, serous drainage in the middle, positive tenderness  Neurological: Awake and alert, normal muscle strength, no focal deficit  Extremities: No edema, no clubbing or cyanosis  Skin:  Warm and dry.No  rash or ecchymosis.   Psychiatric: Mood and affect is normal    Labs:     Recent Labs     10/16/19  0339 10/15/19  1320   WBC 5.85 6.53   Hgb 9.8* 10.5*   Hematocrit 32.2* 34.2*   Platelets 149 178   MCV 85.0  84.4       Recent Labs     10/16/19  0339 10/15/19  1320   Sodium 140 141   Potassium 3.6 3.7   Chloride 106 107   CO2 27 26   BUN 12.0 12.0   Creatinine 0.9 0.8   Glucose 123* 87   Calcium 8.4* 9.0       Recent Labs     10/15/19  1320   AST (SGOT) 18   ALT 25   Alkaline Phosphatase 94   Protein, Total 7.8   Albumin 3.4*   Bilirubin, Total 0.8         Imaging studies:           Thanks for the consultation          Signed by: Tenna Child, MD  Date Time: 10/16/19 9:17 AM      *This note was generated by the Epic EMR system/ Dragon speech recognition and may contain inherent errors or omissions not intended by the user. Grammatical errors, random word insertions, deletions, pronoun errors and incomplete sentences are occasional consequences of this technology due to software limitations. Not all errors are caught or corrected. If there are questions or concerns about the content of this note or information contained within the body of this dictation they should be addressed directly with the author for clarification

## 2019-10-16 NOTE — Progress Notes (Signed)
Case Management Initial Assessment and Discharge Planning:    Situation Chest Pain     Background Past Medical History:  Diagnosis Date   Hypercholesteremia    Hypertension    Seizures    Stroke    2015 and 2017, right side   TIA (transient ischemic attack) 2017      Assessment Pt is a 44 y/o female who presented with Chest Pain.  Pt is AXOX3, is ambulatory and independent with her ADL'S.  Pt's demographics were verified and pt reports having additional supports from her family.  Pt has both Insurance and PCP.  Pt will likely D/C home with no needs.     Recommendation   DCP: Home           10/16/19 1239   Healthcare Decisions   Interviewed: Patient   Orientation/Decision Making Abilities of Patient Alert and Oriented x3, able to make decisions   Advance Directive Patient has advance directive, copy in chart   Prior to admission   Prior level of function Independent with ADLs;Ambulates independently   Type of Residence Private residence   Home Layout One level   Have running water, electricity, heat, etc? Yes   Living Arrangements Family members   How do you get to your MD appointments? self   How do you get your groceries? self   Who fixes your meals? self   Who does your laundry? self   Who picks up your prescriptions? self   Dressing Independent   Grooming Independent   Feeding Independent   Bathing Independent   Toileting Independent   Discharge Planning   Support Systems Spouse/significant other   Anticipated [place] plan discussed with: Same as interviewed   Mode of transportation: Private car (family member)   Consults/Providers   PT Evaluation Needed 2   OT Evalulation Needed 2   SLP Evaluation Needed 2   Outcome Palliative Care Screen Screened but did not meet criteria for intervention   Correct PCP listed in Epic? No (comment)   Family and PCP   PCP on file was verified as the current PCP? Provider not on file   PCP - first name S.   PCP - last name [place]   PCP - city [place]   PCP - state [place]   Important  Message from Medicare Notice   Patient received 1st IMM Letter? n/a

## 2019-10-16 NOTE — Plan of Care (Signed)
Pt [name] Cook, c/o pain to head and abdominal wound. IV morphine given. Wound care done after wound consult. IV antibiotics given. Pt vomited x2 on shift, no c/o nausea before vomiting, or after. VS wnl, call light in reach, bed in lowest position.     Problem: Safety  Goal: Patient will be free from injury during hospitalization  Outcome: Progressing  Flowsheets (Taken 10/16/2019 1724)  Patient will be free from injury during hospitalization:   Assess patient's risk for falls and implement fall prevention plan of care per policy   Provide and maintain safe environment   Use appropriate transfer methods   Ensure appropriate safety devices are available at the bedside   Include patient/ family/ care giver in decisions related to safety   [place] rounding   Assess for patients risk for elopement and implement Elopement Risk Plan per policy     Problem: Pain  Goal: Pain at adequate level as identified by patient  Outcome: Progressing  Flowsheets (Taken 10/16/2019 0512 by Gaspar Cola, RN)  Pain at adequate level as identified by patient:   Reassess pain within 30-60 minutes of any procedure/intervention, per Pain Assessment, Intervention, Reassessment (AIR) Cycle   Identify patient comfort function goal   Assess for risk of opioid induced respiratory depression, including snoring/sleep apnea. Alert healthcare team of risk factors identified.   Assess pain on admission, during daily assessment and/or before any "as needed" intervention(s)   Evaluate if patient comfort function goal is met   Evaluate patient's satisfaction with pain management progress   Consult/collaborate with Pain Service   Offer non-pharmacological pain management interventions   Consult/collaborate with Physical Therapy, Occupational Therapy, and/or Speech Therapy   Include patient/patient care companion in decisions related to pain management as needed     Problem: Compromised Tissue integrity  Goal: Damaged tissue is healing and protected  Outcome:  Progressing  Flowsheets (Taken 10/16/2019 0512 by Gaspar Cola, RN)  Damaged tissue is healing and protected:   Monitor/assess Braden scale every shift   Reposition patient every 2 hours and as needed unless able to reposition self   Increase activity as tolerated/progressive mobility   Provide wound care per wound care algorithm   Relieve pressure to bony prominences for patients at moderate and high risk   Avoid shearing injuries   Use bath wipes, not soap and water, for daily bathing   Keep intact skin clean and dry   Use incontinence wipes for cleaning urine, stool and caustic drainage. Foley care as needed   Monitor external devices/tubes for correct placement to prevent pressure, friction and shearing   Encourage use of lotion/moisturizer on skin   Monitor patient's hygiene practices   Utilize specialty bed   Consult/collaborate with wound care nurse

## 2019-10-16 NOTE — Consults (Addendum)
Wound Ostomy Continence Consultation    Date Time: 10/16/19 4:29 PM  Patient Name: [name] Cook  Consulting Service: WOCN  Reason for Consult:     Assessment & Plan   Media Information     Document Information    Photographs   Abdomen 2x2x1.8cm   10/16/2019 10:29   Attached To:   Hospital Encounter on 10/15/19   Source Information    Marquasia Schmieder D, RN  [address] [place]     Assessment:     Surgical - Abdomen - Wound bed measures 2  cm x 2 cm x 1.8 cm. The wound bed is 90 %  Red pink, 10 % yellow slough/eschar. The wound has a small amount of  yellow drainage to the site. No ododr noted.   Peri-wound slightly macerated and erythema with discoloration. Scar formation noted     Wound care:  Abdomen    1. Clean with  Dakin's Solution   2. Moisten gauze with Dakins solutions (1/4 strength), squeeze out of excess fluid, open the gauze, fluff and tuck into wound including into undermining areas (with cotton tipped applicator).   3. Pack lightly, add additional gauze if needed.   4. Protect periwound skin with Cavilon no sting barrier film or spray.   5. Cover with adhesive foam dressing 4 x 4  6. Change dressing change every 12 hours       Dakin's solution is used to reduce bacterial burden and promote healing.     Dakin's solution is ordered from the pharmacy.   All other supplies can be found in the clean supply room or ordered from central supply.    Initiate/ continue pressure prevention bundle:     Head of the bed 30 degrees or less  Positioning the device to the bedside  Eliminate/minimize pressure from the area  Float heels with boots or pillows  Turn patient  Pressure redistribution cushion to the chair  Use lift sheets/low friction surface sheets for positioning  Pad bony prominences  Nutrition consult/Optimize nutrition  Initiate bed algorithm/Specialty bed  Moisture/Incontinence management - Cleanse with incontinence cleansing wipe/water to manage incontinence and protect skin from urine and stool exposure. Apply  skin barrier protection cream. Apply [place]/external catheter to prevent urinary contamination of the wound. Apply rectal pouch/fecal management system per unit policy, to prevent fecal contamination of an injury or contain diarrhea.                      Wound care orders entered into EMR. Care rendered. Please re-consult the WOC Nurse team if the wound deteriorates or further assistance is required.    Objective Findings   Specialty Bed:     Centrella    Braden Score: Braden Scale Score: 22 (10/16/19 1120)      Wound Assessment:   Wound 10/15/19 Other (Comment) Abdomen Medial;Right open (Active)   Site Description Pink;White;Moist 10/16/19 1120   Shape open 10/16/19 0455   Drainage Amount Small 10/16/19 1120   Drainage Description Serosanguinous 10/16/19 1120   Margins Open 10/16/19 1120   Treatments Cleansed;Pharmaceutical agent 10/16/19 1120   Dressing Foam 10/16/19 1120   Dressing Changed Changed 10/16/19 1120   Dressing Status Clean;Dry;Intact 10/16/19 1120              History of Presenting Illness   HPI:  note from Dr. Lyn Henri is a 44 y.o. female with a PMHx of CVA, frequent admissions for CP/stroke like symptoms,  h/o TBI with seizures who presented with non-healing abd wound. Patient had hysterectomy at [facility] in March. Since then the wound hasn't healed. She has seen the surgeon several times. Two weeks ago she finished 3 weeks of antibiotics through the midline, and said she was on rocephin and levaquin, also on doxycycline. She always has some wound drainage but this increased in the last few weeks, greenish drainage, and yesterday she felt hot and checked her temp and it was 101. The drainage was soaking through her clothes and she was changing the dressing 4-5x/day.  No cough sob abd pain nausea/vomiting diarrhea.  Says she has been getting paresthesias midchest that started as she got anxious regarding the wound. Blood pressure was also more elevated than usual to  177/105.Marland Kitchen  Patient had covid last year. Has not been vaccinated yet. No sick contacts.    Since her hysterectomy she has remained on daily lovenox 40mg , although she isn't sure why.    Past Medical and Surgical History     Past Medical History:   Diagnosis Date   . Hypercholesteremia    . Hypertension    . Seizures    . Stroke     2015 and 2017, right side   . TIA (transient ischemic attack) 2017       Past Surgical History:   Procedure Laterality Date   . APPENDECTOMY     . CHOLECYSTECTOMY     . EGD, BIOPSY N/A 04/16/2017    Procedure: EGD, BIOPSY;  Surgeon: Pershing Proud, MD;  Location: [facility];  Service: Gastroenterology;  Laterality: N/A;   . HYSTERECTOMY       POD: @ORDAYSPST  @LOS :  LOS: 0 days         Lab   Significant Lab Values:    Recent Labs     10/16/19  0339   WBC 5.85   RBC 3.79*   Hgb 9.8*   Hematocrit 32.2*   Sodium 140   Potassium 3.6   Chloride 106   CO2 27   BUN 12.0   Creatinine 0.9   Calcium 8.4*   EGFR >60.0   Glucose 123*    Prealbumin: No components found for: PREALBUMIN  Culture     Korey Prashad "Sunshine" Simmone Cape BSN, RN, [employer]  Wound, Ostomy, and Continence Nurse Coordinator  [facility]  [address], [place], [place] [address]  T [phone] S [phone]  [email or web address]

## 2019-10-16 NOTE — Progress Notes (Addendum)
Pt developed 4-5 spots of raised, red skin on her arm shortly after administering morphine and providing wound care. MD paged and PO benadryl ordered. No sob or swelling noted anywhere else on the body.     Update 1:45pm: Hives disappeared after benadryl and no other adverse reactions occurred.

## 2019-10-16 NOTE — Progress Notes (Signed)
Admission to unit 2311B    Pt admitted to unit. Pt is A&O x4. R/A lungs sound clear. No tele.     Oriented pt with surroundings.   Safety precaution explained. Pt is a low fall risk  2 person skin assesment done and documented. Wound consult sent for surgical wound to middle right abdomen.    IV abx vanco finished infusing and started on Zosyn.   Pt c/o 8/10 pain contacted attending for pain mgt. Morphine q 3 hrs ordered. Given with little relief. Requested Dilaudid per pt stated it helps better; given X1  for better relief.     Personal belongings reviewed and kept at bedside.  Whiteboard updated with current information.   Medication given per order.     Will continue monitor pt and offer comfort.

## 2019-10-16 NOTE — Consults (Signed)
Plastic Surgery Consultation    Patient Name: [name] Cook, [name] Cook  Date Time: 10/16/19 10:27 AM  Requesting Physician: Maple Mirza, MD    Reason for Consultation:   Abdominal wound after hysterectomy in 3/21    History:   [name] Cook is a 44 y.o. female who presents to the hospital on 10/15/2019.  The patient has been to a wound clinic and undergone dressing changes which have failed to heal the wound.  Antibiotics have failed to heal the wound.    I reviewed the chart and confirmed the following:    [name] Cook is a 44 y.o. female with a PMHx of CVA, frequent admissions for CP/stroke like symptoms, h/o TBI with seizures who presented with non-healing abd wound. Patient had hysterectomy at [facility] in March. Since then the wound hasn't healed. She has seen the surgeon several times. Two weeks ago she finished 3 weeks of antibiotics through the midline, and said she was on rocephin and levaquin, also on doxycycline. She always has some wound drainage but this increased in the last few weeks, greenish drainage, and yesterday she felt hot and checked her temp and it was 101. The drainage was soaking through her clothes and she was changing the dressing 4-5x/day.  No cough sob abd pain nausea/vomiting diarrhea.  Says she has been getting paresthesias midchest that started as she got anxious regarding the wound. Blood pressure was also more elevated than usual to 177/105.Marland Kitchen  Patient had covid last year. Has not been vaccinated yet. No sick contacts.    Since her hysterectomy she has remained on daily lovenox 40mg , although she isn't sure why.    Past Medical History:   History reviewed. No pertinent past medical history.    Past Surgical History:    has a past surgical history that includes Appendectomy; Cholecystectomy; EGD, BIOPSY (N/A, 04/16/2017); and Hysterectomy.    Family History:   family history includes Deep vein thrombosis in her father; Myocardial Infarction (age of onset: 9) in her father; No known  problems in her mother.    Social History:    reports that she has never smoked. She has never used smokeless tobacco. She reports that she does not drink alcohol and does not use drugs.    Allergies:     Allergies   Allergen Reactions    Contrast [Iodinated Diagnostic Agents] Anaphylaxis     Patient woke up in ICU after having CT with contrast, last remembers being in CT    Fioricet [Butalbital-Apap-Caffeine] Hives    Bentyl [Dicyclomine] Edema    Fentanyl     Iodine     Motrin [Ibuprofen] Swelling    Percocet [Oxycodone-Acetaminophen]     Shellfish-Derived Products     Toradol [Ketorolac Tromethamine]     Tramadol     Tylenol [Acetaminophen] Hives       Medications:     Current Facility-Administered Medications:     amLODIPine (NORVASC) tablet 5 mg, 5 mg, Oral, Daily, Tirrell, Fransico Him, MD, 5 mg at 10/16/19 2956    aspirin chewable tablet 81 mg, 81 mg, Oral, Daily, Tirrell, Fransico Him, MD, 81 mg at 10/16/19 0839    atorvastatin (LIPITOR) tablet 40 mg, 40 mg, Oral, QHS, Tirrell, Fransico Him, MD    Nursing communication: Adult Hypoglycemia Treatment Algorithm, , , Until Discontinued **AND** dextrose (GLUCOSE) 40 % oral gel 15 g of glucose, 15 g of glucose, Oral, PRN **AND** dextrose 50 % bolus 12.5 g, 12.5 g, Intravenous, PRN **AND** glucagon (rDNA) (GLUCAGEN)  injection 1 mg, 1 mg, Intramuscular, PRN, Tirrell, Fransico Him, MD    enoxaparin (LOVENOX) syringe 40 mg, 40 mg, Subcutaneous, Daily, Tirrell, Fransico Him, MD, 40 mg at 10/16/19 0981    hydroCHLOROthiazide (HYDRODIURIL) tablet 25 mg, 25 mg, Oral, Daily, Tirrell, Fransico Him, MD, 25 mg at 10/16/19 1914    lactobacillus/streptococcus (RISAQUAD) capsule 1 capsule, 1 capsule, Oral, Daily, Tirrell, Fransico Him, MD, 1 capsule at 10/16/19 0839    levETIRAcetam (KEPPRA) tablet 500 mg, 500 mg, Oral, Q12H SCH, Tirrell, Fransico Him, MD, 500 mg at 10/16/19 0839    lisinopril (ZESTRIL) tablet 40 mg, 40 mg, Oral, Daily, Tirrell, Fransico Him, MD, 40 mg at 10/16/19 0839     melatonin tablet 3 mg, 3 mg, Oral, QHS PRN, Tirrell, Fransico Him, MD    morphine injection 4 mg, 4 mg, Intravenous, Q3H PRN, Melenwick, Kandis Mannan, DNP FNP, 4 mg at 10/16/19 0811    naloxone (NARCAN) injection 0.2 mg, 0.2 mg, Intravenous, PRN, Tirrell, Fransico Him, MD    ondansetron [facility]) injection 4 mg, 4 mg, Intravenous, Once PRN, He, Zadie Rhine, MD PhD    pantoprazole (PROTONIX) EC tablet 40 mg, 40 mg, Oral, QAM AC, Tirrell, Fransico Him, MD, 40 mg at 10/16/19 0839    piperacillin-tazobactam (ZOSYN) 4.5 g in sodium chloride 0.9 % 100 mL IVPB mini-bag plus, 4.5 g, Intravenous, Q6H, Tirrell, Fransico Him, MD, Last Rate: 200 mL/hr at 10/16/19 0838, 4.5 g at 10/16/19 7829    Review of Systems:   A comprehensive review of systems was: A 12 point review of systems was conducted and was negative except as above.    Physical Exam:     Visit Vitals  BP 121/78   Pulse 73   Temp 98.1 F (36.7 C) (Oral)   Resp 18   Ht 1.778 m (5\' 10" )   Wt 139.3 kg (307 lb 1.6 oz)   LMP 10/15/2018 (Exact Date)   SpO2 98%   BMI 44.06 kg/m     Wound 3 x 4 cm above umbilicus, tender          General:  No acute distress  Eyes:  Anicteric  Nose: moist mucus membranes  Oral cavity:   moist mucus membranes  Ears: no otorrhea  Neck: supple  Pulm:  No stridor  CV:  No tachycardia  Abd:  No distension  Ext:  Warm  Neuro:  Moves all 4  Psych:  Normal affect  Derm: as above    Labs Reviewed:   Any pertinent labs were reviewed.    Rads:   Any pertinent radiology was reviewed.    Other: Dehiscent supraumbilical soft tissue wound anterior abdominal  wall deep to fascia. No peritoneal perforation. Skin thickening along  dehiscent margin with subcutaneous fat stranding. No discrete collection  or free air. Adjacent inferiorly closed surgical scar from umbilicus to  pubis. Stable appearance fat entrapment along surgical margin with  likely necrosis. Decrease in size 5.8 x 2.2 cm, previously 6.9 x 3.1 cm,  right pelvic sidewall collection of simple fluid  attenuation.      Assessment:   Abdominal wound    Plan:   There is likely something preventing the wound from healing at the level of the abdominal fascia.  I do not believe this represents an infection.  The wound is draining, WBC normal.  A superficial wound breakdown would have healed over the past several months.      I am unable to access the records of [facility].  I believe this patient would be best served by returning to the facility where she was initially treated that is in the best position to understand 1) the exact nature of her surgery 2) why she is on lovenox 3) other known risk factors which I am not privy to.    Consider BID dakins dressing changes and f/u at [facility]    [phone] (o)    Signed by: Ross Ludwig

## 2019-10-16 NOTE — Progress Notes (Signed)
S[facility] H[facility]  PROGRESS NOTE      Patient: [name] Cook  Date: 10/16/2019   LOS: 0 Days  Admission Date: 10/15/2019   MRN: [id number]  Attending: Maple Mirza  Please contact me on the following epic chat       ASSESSMENT/PLAN     [name] Cook is a 44 y.o. female admitted with wound dehiscence.     Interval Summary:     Patient Active Hospital Problem List:    Wound dehiscence  Cellulitis  Fever  Failed outpatient antibiotics- including IV  Patient tells me she just finished 3 weeks of antibiotics for this- had midline with ceftriaxone, levaquin, doxy  CT confirms soft tissue infection and wound dehiscence  Plastic surgery: not clearly infected, follow up with the surgeon (patient states she has a legal case and will not return to that surgeon but will see someone else)  Blood cultures pending, wound culture pending  zosyn (greenish drainage)   ID consulted, discussed- wait for culture results and discharge home hopefully on oral antibiotics    Hypertensive urgency 186/96  Likely due in part to anxiety   Continue home hctz  Started amlodipine   Resolved     H/o CVA  States she doesn't take aspirin any longer  Was initially on asa/plavix- and was still on these 02/2018 when I discharged her  Would recommend aspirin for her    Post op anticoagulation  Patient tells me that she has been on lovenox 40mg  SC daily since March hysterectomy  Recommend she discuss duration with surgeon    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 No sob chest pain fever chills nausea/vomiting. Not happy with surgery consult    MEDICATIONS     Current Facility-Administered  Medications   Medication Dose Route Frequency   . amLODIPine  5 mg Oral Daily   . aspirin  81 mg Oral Daily   . atorvastatin  40 mg Oral QHS   . enoxaparin  40 mg Subcutaneous Daily   . hydroCHLOROthiazide  25 mg Oral Daily   . lactobacillus/streptococcus  1 capsule Oral Daily   . levETIRAcetam  500 mg Oral Q12H SCH   . lisinopril  40 mg Oral Daily   . pantoprazole  40 mg Oral QAM AC   . piperacillin-tazobactam  4.5 g Intravenous Q6H   . sodium hypochlorite   Irrigation Daily       PHYSICAL EXAM     Vitals:    10/16/19 1057   BP: 134/86   Pulse: 62   Resp: 18   Temp: 97.9 F (36.6 C)   SpO2: 99%       Temperature: Temp  Min: 97.7 F (36.5 C)  Max: 98.1 F (36.7 C)  Pulse: Pulse  Min: 62  Max: 87  Respiratory: Resp  Min: 17  Max: 19  Non-Invasive BP: BP  Min: 121/78  Max: 186/91  Pulse Oximetry SpO2  Min: 96 %  Max: 99 %    Intake and  Output Summary (Last 24 hours) at Date Time    Intake/Output Summary (Last 24 hours) at 10/16/2019 1556  Last data filed at 10/16/2019 1200  Gross per 24 hour   Intake 1600 ml   Output --   Net 1600 ml         GEN APPEARANCE: Normal;  A&OX3  HEENT: PERLA; EOMI; Conjunctiva Clear  NECK: Supple; No bruits  CVS: RRR, S1, S2; No M/G/R  LUNGS: CTAB; No Wheezes; No Rhonchi: No rales  ABD: Soft; No TTP; + Normoactive BS  EXT: No edema; Pulses 2+ and intact  Skin exam: wound dehiscence with clear drainage  NEURO: CN 2-12 intact; No Focal neurological deficits  CAP REFILL:  Normal  MENTAL STATUS:  Normal    Exam done by Maple Mirza, MD on 10/16/19 at 3:56 PM      LABS     Recent Labs   Lab 10/16/19  0339 10/15/19  1320   WBC 5.85 6.53   RBC 3.79* 4.05   Hgb 9.8* 10.5*   Hematocrit 32.2* 34.2*   MCV 85.0 84.4   Platelets 149 178       Recent Labs   Lab 10/16/19  0339 10/15/19  1320   Sodium 140 141   Potassium 3.6 3.7   Chloride 106 107   CO2 27 26   BUN 12.0 12.0   Creatinine 0.9 0.8   Glucose 123* 87   Calcium 8.4* 9.0       Recent Labs   Lab 10/15/19  1320   ALT 25   AST (SGOT) 18    Bilirubin, Total 0.8   Albumin 3.4*   Alkaline Phosphatase 94       Recent Labs   Lab 10/15/19  1320   Troponin I <0.01             Microbiology Results (last 15 days)     Procedure Component Value Units Date/Time    Blood Culture #2 [[id number]] Collected: 10/15/19 1715    Order Status: Sent Specimen: Arm from Blood, Venipuncture Updated: 10/15/19 1819    Narrative:      1 BLUE+1 PURPLE    Blood Culture #1 [[id number]] Collected: 10/15/19 1715    Order Status: Sent Specimen: Arm from Blood, Venipuncture Updated: 10/15/19 1819    Narrative:      1 BLUE+1 PURPLE    COVID-19 (SARS-COV-2) ([place] [facility]) [[id number]] Collected: 10/15/19 1715    Order Status: Completed Specimen: Nasopharyngeal Swab from Nasopharynx Updated: 10/15/19 1806     Purpose of COVID testing Screening     SARS-CoV-2 Specimen Source Nasopharyngeal     SARS CoV 2 Overall Result Negative     Comment: Test performed using the Abbott ID NOW EUA assay.  Please see Fact Sheets for patients and providers located at:  http://www.rice.biz/    This test is for the qualitative detection of SARS-CoV-2  (COVID19) nucleic acid. Viral nucleic acids may persist in vivo,  independent of viability. Detection of viral nucleic acid does  not imply the presence of infectious virus, or that virus  nucleic acid is the cause of clinical symptoms. Negative  results should be treated as presumptive and, if inconsistent  with clinical signs and symptoms or necessary for patient  management, should be tested with an alternative molecular  assay. Negative results do not preclude SARS-CoV-2 infection  and should not be used as the sole basis for patient  management decisions. Invalid results may be due to inhibiting  substances in the specimen and recollection should occur.         Narrative:      o Collect and clearly label specimen type:  o Upper respiratory specimen: One Nasopharyngeal Dry Swab NO  Transport Media.  o Hand deliver to laboratory  ASAP  Indication for testing->Extended care facility admission to  semi private room  Screening    Wound culture & gram stain [[id number]] Collected: 10/15/19 1320    Order Status: Completed Specimen: Wound Updated: 10/15/19 1924    Narrative:      blue top swab  ORDER#: [id number]                                    ORDERED BY: Eldred Manges  SOURCE: Wound abd wound                              COLLECTED:  10/15/19 13:20  ANTIBIOTICS AT COLL.:                                RECEIVED :  10/15/19 16:24  ORDER ENTRY COMMENTS:  blue top swab  Stain, Gram                                FINAL       10/15/19 19:24  10/15/19   Many WBCs             No Squamous epithelial cells seen             No organisms seen  Culture and Gram Stain, Aerobic, Wound     PENDING             RADIOLOGY     Upon my review:    Loni Beckwith  3:56 PM 10/16/2019

## 2019-10-16 NOTE — Plan of Care (Signed)
Problem: Pain  Goal: Pain at adequate level as identified by patient  Outcome: Progressing  Flowsheets (Taken 10/16/2019 0512)  Pain at adequate level as identified by patient:   Reassess pain within 30-60 minutes of any procedure/intervention, per Pain Assessment, Intervention, Reassessment (AIR) Cycle   Identify patient comfort function goal   Assess for risk of opioid induced respiratory depression, including snoring/sleep apnea. Alert healthcare team of risk factors identified.   Assess pain on admission, during daily assessment and/or before any "as needed" intervention(s)   Evaluate if patient comfort function goal is met   Evaluate patient's satisfaction with pain management progress   Consult/collaborate with Pain Service   Offer non-pharmacological pain management interventions   Consult/collaborate with Physical Therapy, Occupational Therapy, and/or Speech Therapy   Include patient/patient care companion in decisions related to pain management as needed     Problem: Compromised Tissue integrity  Goal: Damaged tissue is healing and protected  Outcome: Progressing  Flowsheets (Taken 10/16/2019 0512)  Damaged tissue is healing and protected:   Monitor/assess Braden scale every shift   Reposition patient every 2 hours and as needed unless able to reposition self   Increase activity as tolerated/progressive mobility   Provide wound care per wound care algorithm   Relieve pressure to bony prominences for patients at moderate and high risk   Avoid shearing injuries   Use bath wipes, not soap and water, for daily bathing   Keep intact skin clean and dry   Use incontinence wipes for cleaning urine, stool and caustic drainage. Foley care as needed   Monitor external devices/tubes for correct placement to prevent pressure, friction and shearing   Encourage use of lotion/moisturizer on skin   Monitor patient's hygiene practices   Utilize specialty bed   Consult/collaborate with wound care nurse  Goal: Nutritional status  is improving  Outcome: Progressing  Flowsheets (Taken 10/16/2019 0512)  Nutritional status is improving: Allow adequate time for meals

## 2019-10-17 DIAGNOSIS — T8130XA Disruption of wound, unspecified, initial encounter: Secondary | ICD-10-CM

## 2019-10-17 DIAGNOSIS — I160 Hypertensive urgency: Secondary | ICD-10-CM

## 2019-10-17 DIAGNOSIS — Z8669 Personal history of other diseases of the nervous system and sense organs: Secondary | ICD-10-CM

## 2019-10-17 MED ORDER — RISAQUAD PO CAPS
1.0000 | ORAL_CAPSULE | Freq: Every day | ORAL | 0 refills | Status: DC
Start: 2019-10-18 — End: 2020-03-13

## 2019-10-17 MED ORDER — AMLODIPINE BESYLATE 5 MG PO TABS
5.0000 mg | ORAL_TABLET | Freq: Every day | ORAL | 0 refills | Status: DC
Start: 2019-10-18 — End: 2020-09-15

## 2019-10-17 MED ORDER — HYDROMORPHONE HCL 2 MG PO TABS
2.0000 mg | ORAL_TABLET | Freq: Once | ORAL | Status: AC
Start: 2019-10-17 — End: 2019-10-17
  Administered 2019-10-17: 01:00:00 2 mg via ORAL
  Filled 2019-10-17: qty 1

## 2019-10-17 MED ORDER — MORPHINE SULFATE 2 MG/ML IJ/IV SOLN (WRAP)
1.0000 mg | Freq: Once | Status: DC
Start: 2019-10-17 — End: 2019-10-17

## 2019-10-17 MED ORDER — AMOXICILLIN-POT CLAVULANATE 875-125 MG PO TABS
1.0000 | ORAL_TABLET | Freq: Two times a day (BID) | ORAL | 0 refills | Status: AC
Start: 2019-10-17 — End: 2019-10-24

## 2019-10-17 MED ORDER — HYDROMORPHONE HCL 2 MG PO TABS
4.0000 mg | ORAL_TABLET | Freq: Once | ORAL | Status: DC
Start: 2019-10-17 — End: 2019-10-17
  Filled 2019-10-17: qty 2

## 2019-10-17 MED ORDER — TAB-A-VITE/BETA CAROTENE PO TABS
1.0000 | ORAL_TABLET | Freq: Every day | ORAL | 0 refills | Status: DC
Start: 2019-10-17 — End: 2021-07-29

## 2019-10-17 MED ORDER — ASCORBIC ACID 500 MG PO TABS
500.0000 mg | ORAL_TABLET | Freq: Every day | ORAL | 0 refills | Status: DC
Start: 2019-10-17 — End: 2020-03-13

## 2019-10-17 MED ORDER — LIDOCAINE 5 % EX PTCH
1.0000 | MEDICATED_PATCH | CUTANEOUS | 0 refills | Status: DC
Start: 2019-10-18 — End: 2020-09-18

## 2019-10-17 MED ORDER — AMOXICILLIN-POT CLAVULANATE 875-125 MG PO TABS
1.0000 | ORAL_TABLET | Freq: Two times a day (BID) | ORAL | Status: DC
Start: 2019-10-17 — End: 2019-10-17

## 2019-10-17 MED ORDER — SODIUM HYPOCHLORITE 0.125 % EX SOLN
Freq: Every day | CUTANEOUS | 0 refills | Status: DC
Start: 2019-10-17 — End: 2020-09-15

## 2019-10-17 MED ORDER — ZINC 220 (50 ZN) MG PO CAPS
1.0000 | ORAL_CAPSULE | Freq: Every day | ORAL | Status: DC
Start: 2019-10-17 — End: 2020-03-13

## 2019-10-17 NOTE — Discharge Instr - Diet (Signed)
Regular

## 2019-10-17 NOTE — UM Notes (Addendum)
Primary Coverage: MEDICAID HMO/[employer] [place] MEDICA[place] [facility]    CONCURRENT REVIEW October 17, 2019    PATIENT NAME: [name] Cook, [name] Cook  DOB: November 07, 1975    History of present illness: Pt is a 44 y.o. female arrived at [facility] on 10/15/2019 at 1306.and admitted to 23S    BP 130/81   Pulse 69   Temp 98.1 F (36.7 C) (Oral)   Resp 18   Ht 1.778 m (5\' 10" )   Wt 132.5 kg (292 lb)   LMP 10/15/2018 (Exact Date)   SpO2 98%   BMI 41.90 kg/m     Temp:  [97.7 F (36.5 C)-98.2 F (36.8 C)]   Heart Rate:  [50-80]   Resp Rate:  [15-18]   BP: (99-130)/(58-81)   SpO2:  [93 %-98 %]   Weight:  [132.5 kg (292 lb)]     Last recorded pain score:  Pain Scale Used: Numeric Scale (0-10)  Pain Score: 8-severe pain       Abnormal Labs:   Lab Results last 48 Hours     Procedure Component Value Units Date/Time    Wound culture & gram stain [[id number]] Collected: 10/15/19 1320    Specimen: Wound Updated: 10/16/19 1956    Narrative:      blue top swab  ORDER#: [id number]                                    ORDERED BY: Eldred Manges  SOURCE: Wound abd wound                              COLLECTED:  10/15/19 13:20  ANTIBIOTICS AT COLL.:                                RECEIVED :  10/15/19 16:24  ORDER ENTRY COMMENTS:  blue top swab  Stain, Gram                                FINAL       10/15/19 19:24  10/15/19   Many WBCs             No Squamous epithelial cells seen             No organisms seen  Culture and Gram Stain, Aerobic, Wound     PRELIM      10/16/19 19:56   +  10/16/19   Heavy growth of Staphylococcus aureus               Susceptibility to follow        Basic Metabolic Panel [[id number]]  (Abnormal) Collected: 10/16/19 0339    Specimen: Blood Updated: 10/16/19 0449     Glucose 123 mg/dL      Calcium 8.4 mg/dL     CBC and differential [[id number]]  (Abnormal) Collected: 10/16/19 0339     Hgb 9.8 g/dL      Hematocrit 62.9 %      RBC 3.79 x10 6/uL      MCHC 30.4 g/dL      RDW 16 %      MPV 13.2 fL     COVID-19 (SARS-COV-2) ([place]  Rapid) [[id number]] Collected: 10/15/19 1715    Specimen: Nasopharyngeal Swab from Nasopharynx Updated: 10/15/19 1806  Purpose of COVID testing Screening     SARS-CoV-2 Specimen Source Nasopharyngeal     SARS CoV 2 Overall Result Negative    UA Reflex to Micro - Reflex to Culture [[id number]]  (Abnormal) Collected: 10/15/19 1715     Leukocyte Esterase, UA Trace    Lactic Acid [[id number]] Collected: 10/15/19 1715    Specimen: Blood Updated: 10/15/19 1729          MD Notes:  MD 10/16/2019  ASSESSMENT/PLAN  Jerine Surles is a 44 y.o. female admitted with wound dehiscence.   Interval Summary:   Patient Active Hospital Problem List:  Wound dehiscence  Cellulitis  Fever  Failed outpatient antibiotics- including IV  Patient tells me she just finished 3 weeks of antibiotics for this- had midline with ceftriaxone, levaquin, doxy  CT confirms soft tissue infection and wound dehiscence  Plastic surgery: not clearly infected, follow up with the surgeon (patient states she has a legal case and will not return to that surgeon but will see someone else)  Blood cultures pending, wound culture pending  zosyn (greenish drainage)   ID consulted, discussed- wait for culture results and discharge home hopefully on oral antibiotics  Hypertensive urgency 186/96  Likely due in part to anxiety   Continue home hctz  Started amlodipine   Resolved   H/o CVA  States she doesn't take aspirin any longer  Was initially on asa/plavix- and was still on these 02/2018 when I discharged her  Would recommend aspirin for her  Post op anticoagulation  Patient tells me that she has been on lovenox 40mg  SC daily since March hysterectomy  Recommend she discuss duration with surgeon  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  Keppra  Obesity BMI 42  Lifestyle modification  H/o admissions for chest pain, stroke like symptoms  Per neuro consult 01/2019 symptoms/exam and admissions suggestive of conversion disorder  DVT Prophylaxis:lovenox  Code Status: full  DISPO: pending  culture results    ID 10/17/2019  Assessment:   S/p abdominal hysterectomy (05/2019)   Nonhealing lower abdominal wall wound   Wound culture-staph aureus, sensitivities pending   Blood cultures no growth to date   Generalized anxiety disorder   Seizure disorder   History of CVA   No fever or leukocytosis  Plan:   Discontinue Zosyn   Start antibiotics once the sensitivity result is available later today   Continue local wound care   Continue probiotics   Monitor electrolytes and renal functions closely   Monitor clinically    [facility]      Patient: [name] Cook  Admission Date: 10/15/2019   DOB: December 01, 1975  Discharge Date: 10/17/2019      Discharge Information   Admission Diagnosis:   Cellulitis of other specified site    Discharge Diagnosis:       Active Hospital Problems    Diagnosis   . Wound dehiscence   . Hypertensive urgency   . History of seizure disorder   . Cellulitis of other specified site   . Abdominal pain   . History of stroke   . Class 2 obesity in adult        Admission Condition: fair  Discharge Condition: good  Consultants: plastic surg, ID  Functional Status: baseline  Discharged to: home           Medications: Scheduled Meds:  Current Facility-Administered Medications   Medication Dose Route Frequency   . amLODIPine  5 mg Oral Daily   . aspirin  81 mg Oral  Daily   . atorvastatin  40 mg Oral QHS   . enoxaparin  40 mg Subcutaneous Daily   . hydroCHLOROthiazide  25 mg Oral Daily   . lactobacillus/streptococcus  1 capsule Oral Daily   . levETIRAcetam  500 mg Oral Q12H SCH   . lidocaine  1 patch Transdermal Q24H   . lisinopril  40 mg Oral Daily   . pantoprazole  40 mg Oral QAM AC   . piperacillin-tazobactam  4.5 g Intravenous Q6H   . sodium hypochlorite   Irrigation Daily     Continuous Infusions:  PRN Meds:.Nursing communication: Adult Hypoglycemia Treatment Algorithm **AND** dextrose **AND** dextrose **AND** glucagon (rDNA), diphenhydrAMINE, melatonin, naloxone, ondansetron       This clinical review is based on compiled documentation provided by the treatment team within the patient's medical record.    UTILIZATION REVIEW CONTACT : Eddie North, MSN, RN, ACM  Utilization Review Case Manager II / Case Management  [facility]  [address]  [place], [place] [address]  T [phone] Judie Petit [phone]  F [phone]   Email: [email or web address]  NPI: [id number] Tax ID:  [id number]

## 2019-10-17 NOTE — Discharge Instr - Activity (Signed)
As Tolerated

## 2019-10-17 NOTE — Discharge Instr - AVS First Page (Addendum)
Reason for your Hospital Admission:  Wound dehiscence       Instructions for after your discharge:  Take augmentin for 1 week.  Please follow up with a plastic surgeon at [facility], or return to [facility] with your records from [facility]. He does feel you need further surgery but not emergently and needs to have all the records.  Follow up on blood cultures.  Wound care instructions:    Clean with  Dakin's Solution  Moisten gauze with Dakins solutions (1/4 strength), squeeze out of excess fluid, open the gauze, fluff and tuck into wound.   Pack lightly, including into undermining areas (with cotton tipped applicator).  Protect periwound skin with Cavilon no sting barrier film or spray.  Cover with adhesive foam dressing 4  X 4  Change dressing change every 12 hours

## 2019-10-17 NOTE — Progress Notes (Signed)
Patient declined labs  

## 2019-10-17 NOTE — Progress Notes (Addendum)
Infectious Diseases & Tropical Medicine  Progress Note    10/17/2019   [name] Cook ZOX:[id number] is a 44 y.o. female,       Assessment:      S/p abdominal hysterectomy (05/2019)   Nonhealing lower abdominal wall wound   Wound culture-MSSA   Blood cultures no growth to date   Generalized anxiety disorder   Seizure disorder   History of CVA   No fever or leukocytosis    Plan:      Discontinue Zosyn   Start Augmentin   Continue local wound care   Plastic surgery following-reviewed note   Continue probiotics   Monitor electrolytes and renal functions closely   Monitor clinically       Discussed with patient in detail   Discussed with microbiology   Discussed with Dr. Doyle Askew    ROS:     General:  no fever, no chills, no rigor, awake and alert, appears very comfortable  HEENT: no neck pain, no throat pain  Endocrine:  no fatigue, no night sweats  Respiratory: no cough, shortness of breath, or wheezing   Cardiovascular: no chest pain   Gastrointestinal: Abdominal wall wound, complains of abdominal pain,no N/V/D  Genito-Urinary: no dysuria, increased urinary frequency, trouble voiding, or hematuria   Musculoskeletal: no edema  Neurological: c/o generalized weakness   Dermatological: no rash, no ulcer    Physical Examination:     Blood pressure 111/76, pulse 65, temperature 98.2 F (36.8 C), temperature source Oral, resp. rate 18, height 1.778 m (5\' 10" ), weight 132.5 kg (292 lb), last menstrual period 10/15/2018, SpO2 95 %.     General Appearance: Comfortable, and in no acute distress.   HEENT: Pupils are equal, round, and reactive to light.    Lungs:  Clear to auscultation   Heart:  Regular rate and rhythm   Chest: Symmetric chest wall expansion.    Abdomen: soft ,non tender,no hepatosplenomegaly,good bowel sounds, quarter size abdominal wall wound with serous drainage, no surrounding erythema, positive tenderness   Neurological: No focal deficit   Extremities: No  edema    Laboratory And Diagnostic Studies:     Recent Labs     10/16/19  0339 10/15/19  1320   WBC 5.85 6.53   Hgb 9.8* 10.5*   Hematocrit 32.2* 34.2*   Platelets 149 178     Recent Labs     10/16/19  0339 10/15/19  1320   Sodium 140 141   Potassium 3.6 3.7   Chloride 106 107   CO2 27 26   BUN 12.0 12.0   Creatinine 0.9 0.8   Glucose 123* 87   Calcium 8.4* 9.0     Recent Labs     10/15/19  1320   AST (SGOT) 18   ALT 25   Alkaline Phosphatase 94   Protein, Total 7.8   Albumin 3.4*       Current Meds:      Scheduled Meds: PRN Meds:    amLODIPine, 5 mg, Oral, Daily  aspirin, 81 mg, Oral, Daily  atorvastatin, 40 mg, Oral, QHS  enoxaparin, 40 mg, Subcutaneous, Daily  hydroCHLOROthiazide, 25 mg, Oral, Daily  lactobacillus/streptococcus, 1 capsule, Oral, Daily  levETIRAcetam, 500 mg, Oral, Q12H SCH  lidocaine, 1 patch, Transdermal, Q24H  lisinopril, 40 mg, Oral, Daily  pantoprazole, 40 mg, Oral, QAM AC  piperacillin-tazobactam, 4.5 g, Intravenous, Q6H  sodium hypochlorite, , Irrigation, Daily        Continuous Infusions:   dextrose, 15 g of  glucose, PRN   And  dextrose, 12.5 g, PRN   And  glucagon (rDNA), 1 mg, PRN  diphenhydrAMINE, 25 mg, Q6H PRN  melatonin, 3 mg, QHS PRN  naloxone, 0.2 mg, PRN  ondansetron, 4 mg, Once PRN          Miracle Mongillo A. Janalyn Rouse, M.D.  10/17/2019  9:47 AM

## 2019-10-17 NOTE — Plan of Care (Signed)
Problem: Safety  Goal: Patient will be free from injury during hospitalization  Outcome: Adequate for Discharge  Goal: Patient will be free from infection during hospitalization  Outcome: Adequate for Discharge     Problem: Pain  Goal: Pain at adequate level as identified by patient  Outcome: Adequate for Discharge     Problem: Side Effects from Pain Analgesia  Goal: Patient will experience minimal side effects of analgesic therapy  Outcome: Adequate for Discharge     Problem: Discharge Barriers  Goal: Patient will be discharged home or other facility with appropriate resources  Outcome: Adequate for Discharge     Problem: Psychosocial and Spiritual Needs  Goal: Demonstrates ability to cope with hospitalization/illness  Outcome: Adequate for Discharge     Problem: Compromised Tissue integrity  Goal: Damaged tissue is healing and protected  Outcome: Adequate for Discharge  Goal: Nutritional status is improving  Outcome: Adequate for Discharge

## 2019-10-17 NOTE — Discharge Summary (Signed)
[facility]      Patient: [name] Cook  Admission Date: 10/15/2019   DOB: 05/22/1975  Discharge Date: 10/17/2019    MRN: [id number]  Discharge Attending:Londen Lorge E Telina Kleckley     Referring Physician: Pcp, None, MD  PCP: Pcp, None, MD       DISCHARGE SUMMARY     Discharge Information   Admission Diagnosis:   Cellulitis of other specified site    Discharge Diagnosis:   Active Hospital Problems    Diagnosis    Wound dehiscence    Hypertensive urgency    History of seizure disorder    Cellulitis of other specified site    Abdominal pain    History of stroke    Class 2 obesity in adult        Admission Condition: fair  Discharge Condition: good  Consultants: plastic surg, ID  Functional Status: baseline  Discharged to: home    Discharge Medications:     Medication List      START taking these medications    amLODIPine 5 MG tablet  Commonly known as: NORVASC  Take 1 tablet (5 mg total) by mouth daily  Start taking on: October 18, 2019     amoxicillin-clavulanate 875-125 MG per tablet  Commonly known as: AUGMENTIN  Take 1 tablet by mouth every 12 (twelve) hours for 7 days     lactobacillus/streptococcus Caps  Take 1 capsule by mouth daily  Start taking on: October 18, 2019     lidocaine 5 %  Commonly known as: LIDODERM  Place 1 patch onto the skin every 24 hours Remove & Discard patch within 12 hours or as directed by MD  Start taking on: October 18, 2019     multivitamin Tabs  Take 1 tablet by mouth daily     sodium hypochlorite 0.125 % Soln  Commonly known as: H-CHLOR 12  Irrigate with as directed daily     vitamin C 500 MG tablet  Commonly known as: ASCORBIC ACID  Take 1 tablet (500 mg total) by mouth daily     Zinc 220 (50 Zn) MG Caps  Take 1 tablet by mouth daily        CONTINUE taking these medications    aspirin 81 MG chewable tablet  Chew 1 tablet (81 mg total) by mouth daily     atorvastatin 40 MG tablet  Commonly known as: LIPITOR     enoxaparin 40 MG/0.4ML Soln  Commonly known as: LOVENOX     hydroCHLOROthiazide  25 MG tablet  Commonly known as: HYDRODIURIL     KEPPRA PO     lisinopril 40 MG tablet  Commonly known as: ZESTRIL     pantoprazole 40 MG tablet  Commonly known as: PROTONIX  Take 1 tablet (40 mg total) by mouth daily        STOP taking these medications    ondansetron 4 MG disintegrating tablet  Commonly known as: ZOFRAN-ODT     topiramate 50 MG tablet  Commonly known as: TOPAMAX           Where to Get Your Medications      These medications were sent to [id number] - [place], [place] - [address] [facility]  [address] [place], [place] [address]    Phone: [phone]    amLODIPine 5 MG tablet   amoxicillin-clavulanate 875-125 MG per tablet   lactobacillus/streptococcus Caps   lidocaine 5 %   sodium hypochlorite 0.125 %  Soln     Information about where to get these medications is not yet available    Ask your nurse or doctor about these medications   multivitamin Tabs   vitamin C 500 MG tablet   Zinc 220 (50 Zn) MG Caps             Hospital Course   Presentation History   [name] Cook is a 44 y.o. female with a PMHx of CVA, frequent admissions for CP/stroke like symptoms, h/o TBI with seizures who presented with non-healing abd wound. Patient had hysterectomy at [facility] in March. Since then the wound hasn't healed. She has seen the surgeon several times. Two weeks ago she finished 3 weeks of antibiotics through the midline, and said she was on rocephin and levaquin, also on doxycycline. She always has some wound drainage but this increased in the last few weeks, greenish drainage, and yesterday she felt hot and checked her temp and it was 101. The drainage was soaking through her clothes and she was changing the dressing 4-5x/day.  No cough sob abd pain nausea/vomiting diarrhea.  Says she has been getting paresthesias midchest that started as she got anxious regarding the wound. Blood pressure was also more elevated than usual to 177/105.Marland Kitchen  Patient had covid  last year. Has not been vaccinated yet. No sick contacts.    Since her hysterectomy she has remained on daily lovenox 40mg , although she isn't sure why.    See HPI for details.    Hospital Course (0 Days)     Patient was admitted with wound dehiscence from surgery several months ago. She was seen by plastic surgery who recommends she follow up with the original surgeon as no urgent need to surgery here. Patient will not follow with same surgeon. Dr Ottis Stain therefore suggests either follow up with a plastic surgeon at [facility], who has access to her records-- or follow up with him in his office but with the records in hand. Patient was seen by ID. She will be discharged with 7 days augmentin.   Patient has allergies to most pain medications. She had one possible reaction to morphine while here, with hives at IV site that responded to benadryl. She was offered oral dilaudid (4mg ) at time of discharge as she complained of abd pain but did not want the medication.    By problem:  Wound dehiscence  Cellulitis  Fever  Patient finished 3 weeks of antibiotics outpatient for this- had midline with ceftriaxone, levaquin, doxy  CT confirms soft tissue infection and wound dehiscence however on exam there is no redness or swelling. Drainage is clear.   Plastic surgery: not clearly infected, follow up with him or with someone (he recommended Dr Alvis Lemmings) at [facility] but with records  Blood cultures pending  Wound culture MSSA- discharge on augmentin  ID consulted, discussed plan    Hypertensive urgency 186/96  Likely due in part to anxiety   Continue home hctz  Started amlodipine   Resolved     H/o CVA  States she doesn't take aspirin any longer  Was initially on asa/plavix- and was still on these 02/2018 when I discharged her  Would recommend aspirin for her    Post op anticoagulation  Patient tells me that she has been on lovenox 40mg  SC daily since March hysterectomy  Recommend she discuss duration  with surgeon    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 No abscess. Decrease in size seroma right pelvic sidewall. Recommendation: None. Adaline Sill, MD  10/15/2019 3:40 PM    XR Chest 2 Views    Result Date: 10/15/2019  No focal consolidation, pleural effusion, or pneumothorax. Raynelle Dick, MD  10/15/2019 12:31 PM         Best Practices   Was the patient admitted with either a CHF Exacerbation or Pneumonia? no     Progress Note/Physical Exam at Discharge     Subjective: Feels pain in abd due to swab used to check culture. And because chronic wound. No sob chest pain fever chills nausea/vomiting.    Vitals:    10/17/19 0844 10/17/19 1012 10/17/19 1224 10/17/19 1551   BP: 111/76 117/76 130/81 116/79   Pulse: 65  69 87   Resp: 18  18 18    Temp: 98.2 F (36.8 C)  98.1 F (36.7 C) 98.4 F (36.9 C)   TempSrc: Oral  Oral Oral   SpO2: 95%  98% 100%   Weight:       Height:           General: NAD, AAOx3  HEENT: perrla, eomi, sclera anicteric, OP: Clear, MMM  Neck: supple, FROM, no LAD  Cardiovascular: RRR, no m/r/g  Lungs: CTAB, no w/r/r  Abdomen: soft, +BS, NT/ND, no masses, no g/r  Extremities: no C/C/E  Skin: wound dehiscense with clear drainage from surgical site  Neuro: CN 2-12 intact; No Focal neurological deficits       Diagnostics     Labs/Studies Pending at Discharge: blood and urine culture    Last Labs   Recent Labs   Lab 10/16/19  0339 10/15/19  1320   WBC 5.85 6.53   RBC 3.79* 4.05   Hgb 9.8* 10.5*   Hematocrit 32.2* 34.2*   MCV 85.0 84.4   Platelets 149 178       Recent Labs   Lab 10/16/19  0339 10/15/19  1320   Sodium 140 141   Potassium 3.6 3.7   Chloride 106  107   CO2 27 26   BUN 12.0 12.0   Creatinine 0.9 0.8   Glucose 123* 87   Calcium 8.4* 9.0       Microbiology Results (last 15 days)     Procedure Component Value Units Date/Time    Blood Culture #2 [[id number]] Collected: 10/15/19 1715    Order Status: Completed Specimen: Arm from Blood, Venipuncture Updated: 10/16/19 1821    Narrative:      ORDER#: [id number]                                    ORDERED BY: HE, ALBERT  SOURCE: Blood, Venipuncture Arm                      COLLECTED:  10/15/19 17:15  ANTIBIOTICS AT COLL.:                                RECEIVED :  10/15/19 18:19  Culture Blood  Aerobic and Anaerobic        PRELIM      10/16/19 18:21  10/16/19   No Growth after 1 day/s of incubation.      Blood Culture #1 [[id number]] Collected: 10/15/19 1715    Order Status: Completed Specimen: Arm from Blood, Venipuncture Updated: 10/16/19 1821    Narrative:      ORDER#: [id number]                                    ORDERED BY: HE, ALBERT  SOURCE: Blood, Venipuncture Arm                      COLLECTED:  10/15/19 17:15  ANTIBIOTICS AT COLL.:                                RECEIVED :  10/15/19 18:19  Culture Blood Aerobic and Anaerobic        PRELIM      10/16/19 18:21  10/16/19   No Growth after 1 day/s of incubation.      COVID-19 (SARS-COV-2) [facility] Rapid) [[id number]] Collected: 10/15/19 1715    Order Status: Completed Specimen: Nasopharyngeal Swab from Nasopharynx Updated: 10/15/19 1806     Purpose of COVID testing Screening     SARS-CoV-2 Specimen Source Nasopharyngeal     SARS CoV 2 Overall Result Negative     Comment: Test performed using the Abbott ID NOW EUA assay.  Please see Fact Sheets for patients and providers located at:  http://www.rice.biz/    This test is for the qualitative detection of SARS-CoV-2  (COVID19) nucleic acid. Viral nucleic acids may persist in vivo,  independent of viability. Detection of viral nucleic acid does  not imply the presence of infectious virus, or that  virus  nucleic acid is the cause of clinical symptoms. Negative  results should be treated as presumptive and, if inconsistent  with clinical signs and symptoms or necessary for patient  management, should be tested with an alternative molecular  assay. Negative results do not preclude SARS-CoV-2 infection  and should not be used as the sole basis for patient  management decisions. Invalid results may be due to inhibiting  substances in the specimen and recollection should occur.         Narrative:      o Collect and clearly label specimen type:  o Upper respiratory specimen: One Nasopharyngeal Dry Swab NO  Transport Media.  o Hand deliver to laboratory ASAP  Indication for testing->Extended care facility admission to  semi private room  Screening    Wound culture & gram stain [[id number]] Collected: 10/15/19 1320    Order Status: Completed Specimen: Wound Updated: 10/17/19 1425    Narrative:      blue top swab  ORDER#: [id number]                                    ORDERED BY: Eldred Manges  SOURCE: Wound abd wound                              COLLECTED:  10/15/19 13:20  ANTIBIOTICS AT COLL.:  RECEIVED :  10/15/19 16:24  ORDER ENTRY COMMENTS:  blue top swab  Stain, Gram                                FINAL       10/15/19 19:24  10/15/19   Many WBCs             No Squamous epithelial cells seen             No organisms seen  Culture and Gram Stain, Aerobic, Wound     PRELIM      10/17/19 14:25   +  10/16/19   Heavy growth of Staphylococcus aureus               Susceptibility to follow             Presumptive Methicillin Sensitive S. aureus by PBP2 testing             Confirmatory testing to follow               Patient Instructions   Discharge Diet: reg  Discharge Activity: as tol    Follow Up Appointment:       Time spent examining patient, discussing with patient/family regarding hospital course, chart review, reconciling medications and discharge planning: 60  minutes.    Janalee Dane Crystalynn Mcinerney    4:09 PM 10/17/2019

## 2019-10-17 NOTE — Progress Notes (Signed)
Patient discharged home, IV line taken out prior to discharge. Discharge teaching and instructions done with patient and she verbalized understanding and stated that she will not pick up the medication. Discharge paper handed over to patient, she was taken downstairs via wheelchair and she drove home. Patient declined wound treatment

## 2019-10-17 NOTE — Discharge Instructions (Signed)
wound  Amoxicillin Oral Tablet 875 mg  Uses  For treating bacterial infection.  Instructions  This medicine may be taken with or without food.  Keep the medicine at room temperature. Avoid heat and direct light.  It is important that you keep taking each dose of this medicine on time even if you are feeling well.  If you forget to take a dose on time, take it as soon as you remember. If it is almost time for the next dose, do not take the missed dose. Return to your normal dosing schedule. Do not take 2 doses of this medicine at one time.  Please tell your doctor and pharmacist about all the medicines you take. Include both prescription and over-the-counter medicines. Also tell them about any vitamins, herbal medicines, or anything else you take for your health.  If you have diabetes and use urine glucose tests, this medicine may cause incorrect results. Please check with your doctor before making any changes to your diabetes treatment plan.  Cautions  Tell your doctor and pharmacist if you ever had an allergic reaction to a medicine. Symptoms of an allergic reaction can include trouble breathing, skin rash, itching, swelling, or severe dizziness.  Do not use the medication any more than instructed.  Please tell your doctor if you have moderate to severe diarrhea while on this medicine. Do not treat the diarrhea with over-the-counter diarrhea medicine.  Tell the doctor or pharmacist if you are pregnant, planning to be pregnant, or breastfeeding.  Ask your pharmacist if this medicine can interact with any of your other medicines. Be sure to tell them about all the medicines you take.  Do not start or stop any other medicines without first speaking to your doctor or pharmacist.  Do not share this medicine with anyone who has not been prescribed this medicine.  Side Effects  The following is a list of some common side effects from this medicine. Please speak with your doctor about what you should do if you experience  these or other side effects.   diarrhea   mouth sores or irritation   nausea   stomach upset or abdominal pain   vomiting  Call your doctor or get medical help right away if you notice any of these more serious side effects:   severe or persistent abdominal pain   severe, watery or bloody diarrhea   symptoms of liver damage (such as yellowing of skin or eyes, dark urine, unusual tiredness or weakness; severe stomach or back pain)   skin irritation such as redness, itching, rash, or burning   yeast infection of mouth   vaginal itching or discharge  A few people may have an allergic reactions to this medicine. Symptoms can include difficulty breathing, skin rash, itching, swelling, or severe dizziness. If you notice any of these symptoms, seek medical help quickly.  Extra  Please speak with your doctor, nurse, or pharmacist if you have any questions about this medicine.  [email or web address]  IMPORTANT NOTE: This document tells you briefly how to take your medicine, but it does not tell you all there is to know about it.Your doctor or pharmacist may give you other documents about your medicine. Please talk to them if you have any questions.Always follow their advice. There is a more complete description of this medicine available in [place].Scan this code on your smartphone or tablet or use the web address below. You can also ask your pharmacist for a printout. If you have any questions, please  ask your pharmacist.    2021 [employer]        Recognizing and Treating Wound Infection  Wounds can become infected with harmful germs (bacteria). This prevents healing. It also increases your risk of scars. In some cases, the infection may spread to other parts of your body. An infection with the bacteria that causes tetanus can be fatal. Know what to look for and get prompt treatment for infection.      A wound that is healing normally is bright, beefy red.      A wound that is  not healing normally may be dark in color or have red streaks.   What are the risk factors for infection?  A wound is more likely to become infected if it:    Results from a hole (puncture), such as from a nail or piece of glass   Results from a human or animal bite, especially a cat.   Isn't cleaned or treated soon after occurring   Occurs in your hand, foot, leg, armpit, or groin (the area where your belly meets your thighs)   Contains dirt or saliva   Heals very slowly   Occurs and you have diabetes, alcoholism, a weak immune system, or poor blood circulation  What are the symptoms of infection?  Call your healthcare provider at the first sign of infection, such as:   Redness, warmth around the wound   Edges look like they are opening   Yellow, yellow-green, or foul-smelling drainage from a wound   More pain, swelling, or redness in or near a wound   A change in the color or size of a wound   Red streaks in the skin around the wound   Fever  How are infections treated?  Treatment depends on the type of infection you have, and how serious it is. Your healthcare provider may prescribe oral antibiotics to help fight bacteria. Your provider may also clean the wound with an antibiotic solution or apply an antibiotic ointment. Sometimes a pocket of pus (abscess) may form. In that case, the abscess will be opened and the fluid drained. You may need hospital care if the infection is very severe.   Preventing wound infection  Follow these steps to help keep wounds from getting infected:   Wash the wound right away with soap and water.   Apply a small amount of antibiotic ointment. You can buy this without a prescription.   Cover wounds with a bandage or gauze dressing. Change it daily or whenever it gets wet or dirty.   Keep the wound clean and dry for the first 24 hours.   Wash your hands before and after you care for your wound.   Change the dressing daily; follow the instructions your healthcare  provider gave you.  [employer] last reviewed this educational content on 08/12/2018   2000-2021 [employer], [place]. All rights reserved. This information is not intended as a substitute for professional medical care. Always follow your healthcare professional's instructions.        Discharge Instructions for Cellulitis  You have been diagnosed with cellulitis. This is an infection in the deepest layer of the skin and tissue beneath the skin. In some cases, the infection also affects the muscle. Cellulitis is caused by bacteria. The bacteria canenter the body through broken skin. This can happen with a cut, scratch, animal bite, or an insect bite that has been scratched. You may have been treated in the hospital with  antibiotics and fluids. You will likely be given a prescription for antibiotics to take at home. This sheet will help you take care of yourself at home.  Home care  When you are home:   Take the prescribed antibiotic medicine you are given as directed until it is gone. Take it even if you feel better. It treats the infection and stops it from returning. Not taking all the medicine can make future infections hard to treat.   Keep the infected area clean.   When possible, raise the infected area above the level of your heart. This helps keep swelling down.   Talk with your healthcare provider if you are in pain. Ask what kind of over-the-counter medicine you can take for pain.   Apply clean bandages as advised.   Take your temperature once a day for a week.   Wash your hands often to prevent spreading the infection.  In the future, wash your hands before and after you touch cuts, scratches, or bandages. This will help prevent infection.  When to call your healthcare provider  Call your healthcare provider right away if you have any of the following:   Trouble or pain when moving the joints above or below the infected area   Discharge or pus draining from the area   [id number] (38C)  or higher, or as directed by your healthcare provider   Pain that gets worse in or around the infected   Redness that gets worse in or around the infected area,particularly if the area of redness expands to a wider area   Shaking chills   Swelling of the infected area   Vomiting  StayWell last reviewed this educational content on 01/11/2018   2000-2021 [employer], [place]. All rights reserved. This information is not intended as a substitute for professional medical care. Always follow your healthcare professional's instructions.

## 2019-10-17 NOTE — Discharge Instr - Other Orders (Signed)
Clean with Dakin's Solution   Moisten gauze with Dakins solutions (1/4 strength), squeeze out of excess fluid, open the gauze, fluff and tuck into wound.   Pack lightly, including into undermining areas (with cotton tipped applicator).   Protect periwound skin with Cavilon no sting barrier film or spray.   Cover with adhesive foam dressing 4 X 4   Change dressing change every 12 hours      Dakin's solution is used to reduce bacterial burden and promote healing.

## 2019-10-17 NOTE — Plan of Care (Signed)
Medicine Nursing Note    Date Time: 10/17/19 6:54 AM  Patient Name: [name] Cook  Attending Physician: Maple Mirza, MD    Assessment     Shift event: Pain mgt; wound care    Neuro: A&O x 4, Calm and Cooperative    Tele/CV/CP No Tele, VSS, RA, Afebrile   Foley/GU N/A Pt voiding    BMs/GI Regular diet   Skin: Wound to abd   Isolation: N/A   Pain: C/o pain to her abd   MEWS: 1   Central Line: N/A   Safety:  Placed low fall risk. Safety maintained, purposeful rounding performed, Bed alarm activated; Call bell within reach.    Interpreter: Pt speaks English and denies interpreter needs.   Comments:  Pt needs a pain mgt plan if possible.  Provided comfort including offers of repositioning, pain medication and heat/cold packs.   Oral fluids offered. Hygiene supply provided.     No c/o Chest Pain  No c/o Cough and SOB.  No acute distress noted with breathing.  Denied Nausea and Vomiting.       The whiteboard was updated with current team information.   The belongings are at bedside.   Discussed and explained the care plan for the night.   Explained and educated Pt on safety precaution.            Problem: Pain  Goal: Pain at adequate level as identified by patient  Outcome: Progressing  Flowsheets (Taken 10/16/2019 0512)  Pain at adequate level as identified by patient:   Reassess pain within 30-60 minutes of any procedure/intervention, per Pain Assessment, Intervention, Reassessment (AIR) Cycle   Identify patient comfort function goal   Assess for risk of opioid induced respiratory depression, including snoring/sleep apnea. Alert healthcare team of risk factors identified.   Assess pain on admission, during daily assessment and/or before any "as needed" intervention(s)   Evaluate if patient comfort function goal is met   Evaluate patient's satisfaction with pain management progress   Consult/collaborate with Pain Service   Offer non-pharmacological pain management interventions   Consult/collaborate with Physical  Therapy, Occupational Therapy, and/or Speech Therapy   Include patient/patient care companion in decisions related to pain management as needed     Problem: Compromised Tissue integrity  Goal: Damaged tissue is healing and protected  Outcome: Progressing  Flowsheets (Taken 10/16/2019 0512)  Damaged tissue is healing and protected:   Monitor/assess Braden scale every shift   Reposition patient every 2 hours and as needed unless able to reposition self   Increase activity as tolerated/progressive mobility   Provide wound care per wound care algorithm   Relieve pressure to bony prominences for patients at moderate and high risk   Avoid shearing injuries   Use bath wipes, not soap and water, for daily bathing   Keep intact skin clean and dry   Use incontinence wipes for cleaning urine, stool and caustic drainage. Foley care as needed   Monitor external devices/tubes for correct placement to prevent pressure, friction and shearing   Encourage use of lotion/moisturizer on skin   Monitor patient's hygiene practices   Utilize specialty bed   Consult/collaborate with wound care nurse

## 2019-10-17 NOTE — Progress Notes (Signed)
Patient verbalized continous aching  pain to the abdomen, PO dilaudid 4 mg ordered and patient declined stating that it does not relieve her pain and she wants IV . Patient declined Dr ordering dilaudid for home pain remedy stating she will prefer to lay in her bed. Dr notified and pharmacy updated.

## 2020-03-12 ENCOUNTER — Emergency Department: Payer: No Typology Code available for payment source

## 2020-03-12 ENCOUNTER — Emergency Department
Admission: EM | Admit: 2020-03-12 | Discharge: 2020-03-13 | Disposition: A | Discharge status: Home / Self Care | Payer: No Typology Code available for payment source | Attending: Internal Medicine | Admitting: Internal Medicine

## 2020-03-12 DIAGNOSIS — R7989 Other specified abnormal findings of blood chemistry: Secondary | ICD-10-CM

## 2020-03-12 DIAGNOSIS — I10 Essential (primary) hypertension: Secondary | ICD-10-CM

## 2020-03-12 DIAGNOSIS — R0602 Shortness of breath: Secondary | ICD-10-CM | POA: Insufficient documentation

## 2020-03-12 DIAGNOSIS — R7889 Finding of other specified substances, not normally found in blood: Secondary | ICD-10-CM | POA: Insufficient documentation

## 2020-03-12 DIAGNOSIS — U071 COVID-19: Secondary | ICD-10-CM | POA: Insufficient documentation

## 2020-03-12 DIAGNOSIS — E785 Hyperlipidemia, unspecified: Secondary | ICD-10-CM | POA: Diagnosis present

## 2020-03-12 DIAGNOSIS — R079 Chest pain, unspecified: Secondary | ICD-10-CM | POA: Insufficient documentation

## 2020-03-12 DIAGNOSIS — N3000 Acute cystitis without hematuria: Secondary | ICD-10-CM | POA: Insufficient documentation

## 2020-03-12 DIAGNOSIS — Z6841 Body Mass Index (BMI) 40.0 and over, adult: Secondary | ICD-10-CM | POA: Diagnosis present

## 2020-03-12 DIAGNOSIS — Z8673 Personal history of transient ischemic attack (TIA), and cerebral infarction without residual deficits: Secondary | ICD-10-CM

## 2020-03-12 LAB — COMPREHENSIVE METABOLIC PANEL
ALT: 25 U/L (ref 0–55)
AST (SGOT): 18 U/L (ref 5–34)
Albumin/Globulin Ratio: 0.8 — ABNORMAL LOW (ref 0.9–2.2)
Albumin: 3.7 g/dL (ref 3.5–5.0)
Alkaline Phosphatase: 118 U/L — ABNORMAL HIGH (ref 37–106)
Anion Gap: 9 (ref 5.0–15.0)
BUN: 12 mg/dL (ref 7.0–19.0)
Bilirubin, Total: 0.6 mg/dL (ref 0.2–1.2)
CO2: 27 mEq/L (ref 22–29)
Calcium: 9.1 mg/dL (ref 8.5–10.5)
Chloride: 105 mEq/L (ref 100–111)
Creatinine: 0.9 mg/dL (ref 0.6–1.0)
Globulin: 4.6 g/dL — ABNORMAL HIGH (ref 2.0–3.6)
Glucose: 134 mg/dL — ABNORMAL HIGH (ref 70–100)
Potassium: 4 mEq/L (ref 3.5–5.1)
Protein, Total: 8.3 g/dL (ref 6.0–8.3)
Sodium: 141 mEq/L (ref 136–145)

## 2020-03-12 LAB — CBC AND DIFFERENTIAL
Absolute NRBC: 0 10*3/uL (ref 0.00–0.00)
Basophils Absolute Automated: 0.05 10*3/uL (ref 0.00–0.08)
Basophils Automated: 0.6 %
Eosinophils Absolute Automated: 0.27 10*3/uL (ref 0.00–0.44)
Eosinophils Automated: 3.1 %
Hematocrit: 38.4 % (ref 34.7–43.7)
Hgb: 11.7 g/dL (ref 11.4–14.8)
Immature Granulocytes Absolute: 0.02 10*3/uL (ref 0.00–0.07)
Immature Granulocytes: 0.2 %
Lymphocytes Absolute Automated: 1.88 10*3/uL (ref 0.42–3.22)
Lymphocytes Automated: 21.5 %
MCH: 27.3 pg (ref 25.1–33.5)
MCHC: 30.5 g/dL — ABNORMAL LOW (ref 31.5–35.8)
MCV: 89.5 fL (ref 78.0–96.0)
MPV: 12.4 fL (ref 8.9–12.5)
Monocytes Absolute Automated: 0.45 10*3/uL (ref 0.21–0.85)
Monocytes: 5.1 %
Neutrophils Absolute: 6.07 10*3/uL (ref 1.10–6.33)
Neutrophils: 69.5 %
Nucleated RBC: 0 /100 WBC (ref 0.0–0.0)
Platelets: 181 10*3/uL (ref 142–346)
RBC: 4.29 10*6/uL (ref 3.90–5.10)
RDW: 16 % — ABNORMAL HIGH (ref 11–15)
WBC: 8.74 10*3/uL (ref 3.10–9.50)

## 2020-03-12 LAB — URINALYSIS REFLEX TO MICROSCOPIC EXAM - REFLEX TO CULTURE
Bilirubin, UA: NEGATIVE
Blood, UA: NEGATIVE
Glucose, UA: NEGATIVE
Ketones UA: NEGATIVE
Nitrite, UA: NEGATIVE
Protein, UR: 30 — AB
Specific Gravity UA: 1.023 (ref 1.001–1.035)
Urine pH: 5 (ref 5.0–8.0)
Urobilinogen, UA: 2 mg/dL (ref 0.2–2.0)

## 2020-03-12 LAB — IHS D-DIMER: D-Dimer: 1.45 ug/mL FEU — ABNORMAL HIGH (ref 0.00–0.60)

## 2020-03-12 LAB — GFR: EGFR: >60

## 2020-03-12 LAB — B-TYPE NATRIURETIC PEPTIDE: B-Natriuretic Peptide: <10 pg/mL (ref 0–100)

## 2020-03-12 LAB — TROPONIN I: Troponin I: <0.01 ng/mL (ref 0.00–0.05)

## 2020-03-12 LAB — HEMOLYSIS INDEX: Hemolysis Index: -1 — ABNORMAL LOW (ref 0–18)

## 2020-03-12 NOTE — ED Triage Notes (Signed)
Patient complains of SOB and middle chest pain radiates to neck and shoulder started yesterday denies fever or body.

## 2020-03-12 NOTE — ED Notes (Signed)
Bed: BL18  Expected date:   Expected time:   Means of arrival:   Comments:

## 2020-03-12 NOTE — ED Triage Notes (Signed)
[facility] EMERGENCY DEPARTMENT MEDICAL SCREENING EXAM NOTE        Patient Name: [name] Cook    Chief Complaint:   Chief Complaint   Patient presents with   . Shortness of Breath   . Chest Pain       HPI: [name] Cook is a 44 y.o. female, who has had a rapid medical screening evaluation initiated by myself for the chief complaint of Cp and SOB x 1 day, constant. No cough, fever, chills. +nausea. No n/v. +lower abd pain. No urinary sx.       ROS: Please see HPI.     Medical/Surgical/Social history: as per HPI    Vitals: BP (!) 183/110   Pulse 90   Temp 97.7 F (36.5 C) (Oral)   Resp 22   LMP 10/15/2018 (Exact Date)   SpO2 95%     Pertinent brief exam:   Constitutional: No acute distress.Well-nourished.  Cardiovascular: Normal heart rate.   Pulmonary: Normal effort.  No respiratory distress. No stridor.  Skin: Normal skin color. No diaphoresis.  Psych: Normal affect.     Additional pertinent physical exam findings: Lungs CTA. Heart RRR. Mild lower abd suprapubic ttp.    Preliminary orders: cbc, cmp, trop, ekg, cxr, covid, ua    Symptom based diagnosis: CP, SOB, HTN      Patient advised to remain in the ED until further evaluation can be performed. Patient instructed to notify staff of any changes in condition while waiting.    This assessment is an initial evaluation to expedite care.

## 2020-03-12 NOTE — ED Provider Notes (Signed)
EMERGENCY DEPARTMENT HISTORY AND PHYSICAL EXAM    Date: 03/12/2020  Patient Name: [name] Cook  Attending Physician: Darcus Pester, MD  Mid-level: Franciso [place] PA-C    History of Presenting Illness    Physician/Midlevel provider first contact with patient: 03/12/20 2204           Personal Protective Equipment (PPE)    Gloves, Goggles, N95 and procedural mask       Chief Complaint   Patient presents with   . Shortness of Breath   . Chest Pain       History Provided By: Patient     Chief Complaint: CP, SOB, nausea,   Onset: 1 day  Timing: constant   Location: L side of chest   Quality: " aching"  Severity: mod   Modifying Factors: SOB worse with exertion but chest discomfort stays the same    Additional History: [name] Cook is a 44 y.o. female with PMH of HTN, seizures, Hypercholesterolemia, Stroke (R Side) 2015/2017, Arrhythmia, Obesity, appendectomy, Cholecystectomy, hysterectomy with c/o 1 day of constant L sided upper chest "aching" radiating to L shoulder and L upper arm associated with some nausea,  SOB with exertion or lying flat. Denies worsening pain with deep breath, leg pain or swelling.  Patient reports 6 pillow orthopnea. Pt denies any ill sxs of fever/chills, cough, congestion, ST,  vomiting, urinary symptoms. Patient also notes some intremittent lower abdominal discomfort over last 3 days. Patient states chronic issue of hysterectomy surgical site with infection and wound reopening since March of last year. Currently area is healed.      Patient notes visit with PMD 2 weeks ago. BP elevated at that visit but MD wanted to continue with current BP regimen .  Patient is compliant with medications.     Last stress test ? 2015. Abnormal and Holter monitor done for 2 week. Then implanted loop monitor that patient didn't tolerate and had to be removed. Arrhythmia. Pt reports pauses in HR with then bursts of faster HR discovered. Patient has not seen cardiologist since that time.     No hormone  medications. No recent travel. + fam hx of DVT (father). No personal hx of DVT.     Dr. Reece Agar [facility])     Extensive allergies to medications including IV contrast. Anaphylaxis.   Patient reports only pain medication she knows she tolerated was dilaudid after hysterectomy.     PCP: Pcp, None, MD      Current Facility-Administered Medications:   .  heparin 25,000 units in dextrose 5% 500 mL infusion (premix), 18 Units/kg/hr, Intravenous, Continuous, Inioluwa Baris R, PA    Current Outpatient Medications:   .  amLODIPine (NORVASC) 5 MG tablet, Take 1 tablet (5 mg total) by mouth daily, Disp: 30 tablet, Rfl: 0  .  aspirin 81 MG chewable tablet, Chew 1 tablet (81 mg total) by mouth daily, Disp: 30 tablet, Rfl: 0  .  atorvastatin (LIPITOR) 40 MG tablet, Take by mouth., Disp: , Rfl:   .  enoxaparin (LOVENOX) 40 MG/0.4ML Solution, Inject 40 mg into the skin every 24 hours, Disp: , Rfl:   .  hydroCHLOROthiazide (HYDRODIURIL) 25 MG tablet, Take 25 mg by mouth daily, Disp: , Rfl:   .  lactobacillus/streptococcus (RISAQUAD) Cap, Take 1 capsule by mouth daily, Disp: 30 capsule, Rfl: 0  .  LevETIRAcetam (KEPPRA PO), Take 500 mg by mouth 2 (two) times daily.  , Disp: , Rfl:   .  lidocaine (LIDODERM) 5 %,  Place 1 patch onto the skin every 24 hours Remove & Discard patch within 12 hours or as directed by MD, Disp: 30 each, Rfl: 0  .  lisinopril (PRINIVIL,ZESTRIL) 40 MG tablet, Take 40 mg by mouth daily., Disp: , Rfl:   .  multivitamin (MULTIVITAMIN) Tab, Take 1 tablet by mouth daily, Disp: , Rfl: 0  .  pantoprazole (PROTONIX) 40 MG tablet, Take 1 tablet (40 mg total) by mouth daily, Disp: 30 tablet, Rfl: 0  .  sodium hypochlorite (H-CHLOR 12) 0.125 % Solution, Irrigate with as directed daily, Disp: 473 mL, Rfl: 0  .  vitamin C (ASCORBIC ACID) 500 MG tablet, Take 1 tablet (500 mg total) by mouth daily, Disp: , Rfl: 0  .  Zinc 220 (50 Zn) MG Cap, Take 1 tablet by mouth daily, Disp: , Rfl:     The review of the patient's previously  prescribed medications does not in any way constitute an endorsement by this clinician of their use, dosage, indications, route, efficacy, interactions, or other clinical parameters.  Past Medical History     Past Medical History:   Diagnosis Date   . Hypercholesteremia    . Hypertension    . Seizures    . Stroke     2015 and 2017, right side   . TIA (transient ischemic attack) 2017     Past Surgical History:   Procedure Laterality Date   . APPENDECTOMY (OPEN)     . CHOLECYSTECTOMY     . EGD, BIOPSY N/A 04/16/2017    Procedure: EGD, BIOPSY;  Surgeon: Pershing Proud, MD;  Location: [facility];  Service: Gastroenterology;  Laterality: N/A;   . HYSTERECTOMY         Family History     Family History   Problem Relation Age of Onset   . Myocardial Infarction Father 36   . Deep vein thrombosis Father    . No known problems Mother        Social History     Social History     Tobacco Use   . Smoking status: Never Smoker   . Smokeless tobacco: Never Used   Vaping Use   . Vaping Use: Never used   Substance Use Topics   . Alcohol use: No   . Drug use: No       Allergies     Allergies   Allergen Reactions   . Contrast [Iodinated Diagnostic Agents] Anaphylaxis     Patient woke up in ICU after having CT with contrast, last remembers being in CT   . Fioricet [Butalbital-Apap-Caffeine] Hives   . Bentyl [Dicyclomine] Edema   . Fentanyl    . Iodine    . Motrin [Ibuprofen] Swelling   . Percocet [Oxycodone-Acetaminophen]    . Shellfish-Derived Products    . Toradol [Ketorolac Tromethamine]    . Tramadol    . Tylenol [Acetaminophen] Hives         Past Medical History, Past Surgical History, Family History, Social History, Allergies reviewed and pertinent elements documented, if relevant to the case, otherwise noncontributory.    Review of Systems     Review of Systems   Constitutional: Negative for chills, fever and malaise/fatigue.   HENT: Negative for congestion and sore throat.    Eyes: Negative for discharge and redness.    Respiratory: Positive for shortness of breath. Negative for cough, hemoptysis, sputum production and wheezing.    Cardiovascular: Positive for chest pain and orthopnea (6 pillow). Negative for palpitations  and leg swelling.   Gastrointestinal: Positive for abdominal pain (lower, suprapubic) and nausea. Negative for blood in stool, constipation, diarrhea and vomiting.   Genitourinary: Negative for dysuria, flank pain, frequency, hematuria and urgency.   Musculoskeletal: Negative for back pain and myalgias.   Skin: Negative.  Negative for rash.        Surgical wound healed at this time. No drainage.    Neurological: Negative for dizziness and headaches.         Physical Exam   BP 166/84   Pulse 77   Temp 97.7 F (36.5 C) (Oral)   Resp 15   Wt 146.1 kg   LMP 10/15/2018 (Exact Date)   SpO2 97%   BMI 46.20 kg/m     Physical Exam  Vitals and nursing note reviewed.   Constitutional:       Appearance: She is obese.   HENT:      Head: Normocephalic and atraumatic.      Right Ear: External ear normal.      Left Ear: External ear normal.      Nose: Nose normal.      Mouth/Throat:      Mouth: Mucous membranes are moist.   Eyes:      Conjunctiva/sclera: Conjunctivae normal.      Pupils: Pupils are equal, round, and reactive to light.   Neck:      Vascular: No hepatojugular reflux or JVD.   Cardiovascular:      Rate and Rhythm: Normal rate and regular rhythm.      Pulses: Normal pulses.      Heart sounds: Normal heart sounds. No murmur heard.  No gallop.    Pulmonary:      Effort: Pulmonary effort is normal.      Breath sounds: Normal breath sounds. No decreased breath sounds, wheezing, rhonchi or rales.   Chest:      Chest wall: No tenderness.   Musculoskeletal:      Cervical back: Normal range of motion and neck supple.      Right lower leg: No edema.      Left lower leg: No edema.   Lymphadenopathy:      Cervical: No cervical adenopathy.   Skin:     General: Skin is warm and dry.   Neurological:      Mental Status:  She is alert and oriented to person, place, and time.   Psychiatric:         Mood and Affect: Mood and affect normal.         Cognition and Memory: Memory normal.         Judgment: Judgment normal.         Diagnostic Study Results     Labs -     Results     Procedure Component Value Units Date/Time    COVID-19 (SARS-COV-2) ([facility] Rapid) [[id number]]  (Abnormal) Collected: 03/13/20 0126    Specimen: Nasopharyngeal Swab from Nasopharynx Updated: 03/13/20 0230     Purpose of COVID testing Screening     SARS-CoV-2 Specimen Source Nasopharyngeal     SARS CoV 2 Overall Result Positive    Narrative:      o Collect and clearly label specimen type:  o Upper respiratory specimen: One Nasopharyngeal Dry Swab NO  Transport Media.  o Hand deliver to laboratory ASAP  Indication for testing->Extended care facility admission to  semi private room  Screening    Troponin I [[id number]] Collected: 03/13/20 0126    Specimen:  Blood Updated: 03/13/20 0157     Troponin I <0.01 ng/mL     COVID-19 (SARS-CoV-2) ([place] Standard test) [[id number]] Collected: 03/12/20 2222    Specimen: Nasopharyngeal Swab from Nasopharynx Updated: 03/13/20 0104     Purpose of COVID testing Diagnostic -[facility]I     SARS-CoV-2 Specimen Source Nasopharyngeal    Narrative:      o Collect and clearly label specimen type:  o PREFERRED-Upper respiratory specimen: One Nasopharyngeal  Swab in Transport Media.  o Hand deliver to laboratory ASAP  Diagnostic -[facility]I    Urine culture [[id number]] Collected: 03/12/20 2222    Specimen: Urine, Clean Catch Updated: 03/13/20 0100    Narrative:      Indications for Urine Culture:->Suprapubic Pain/Tenderness or  Dysuria    D-Dimer [[id number]]  (Abnormal) Collected: 03/12/20 1953     Updated: 03/12/20 2315     D-Dimer 1.45 ug/mL FEU     B-type Natriuretic Peptide [[id number]] Collected: 03/12/20 1953    Specimen: Blood Updated: 03/12/20 2312     B-Natriuretic Peptide <10 pg/mL     UA Reflex to Micro - Reflex to Culture [[id number]]  (Abnormal)  Collected: 03/12/20 2222     Updated: 03/12/20 2252     Urine Type Urine, Clean Ca     Color, UA Yellow     Clarity, UA Sl Cloudy     Specific Gravity UA 1.023     Urine pH 5.0     Leukocyte Esterase, UA Moderate     Nitrite, UA Negative     Protein, UR 30     Glucose, UA Negative     Ketones UA Negative     Urobilinogen, UA 2.0 mg/dL      Bilirubin, UA Negative     Blood, UA Negative     RBC, UA 11 - 25 /hpf      WBC, UA 6 - 10 /hpf      Squamous Epithelial Cells, Urine 11 - 25 /hpf      Urine Mucus Present    Troponin I [[id number]] Collected: 03/12/20 1953    Specimen: Blood Updated: 03/12/20 2053     Troponin I <0.01 ng/mL     Comprehensive metabolic panel [[id number]]  (Abnormal) Collected: 03/12/20 1953    Specimen: Blood Updated: 03/12/20 2033     Glucose 134 mg/dL      BUN 29.5 mg/dL      Creatinine 0.9 mg/dL      Sodium 188 mEq/L      Potassium 4.0 mEq/L      Chloride 105 mEq/L      CO2 27 mEq/L      Calcium 9.1 mg/dL      Protein, Total 8.3 g/dL      Albumin 3.7 g/dL      AST (SGOT) 18 U/L      ALT 25 U/L      Alkaline Phosphatase 118 U/L      Bilirubin, Total 0.6 mg/dL      Globulin 4.6 g/dL      Albumin/Globulin Ratio 0.8     Anion Gap 9.0    Hemolysis index [[id number]]  (Abnormal) Collected: 03/12/20 1953     Updated: 03/12/20 2033     Hemolysis Index -1    GFR [[id number]] Collected: 03/12/20 1953     Updated: 03/12/20 2033     EGFR >60.0    CBC and differential [[id number]]  (Abnormal) Collected: 03/12/20 1953    Specimen: Blood Updated: 03/12/20 2007  WBC 8.74 x10 3/uL      Hgb 11.7 g/dL      Hematocrit 16.1 %      Platelets 181 x10 3/uL      RBC 4.29 x10 6/uL      MCV 89.5 fL      MCH 27.3 pg      MCHC 30.5 g/dL      RDW 16 %      MPV 12.4 fL      Neutrophils 69.5 %      Lymphocytes Automated 21.5 %      Monocytes 5.1 %      Eosinophils Automated 3.1 %      Basophils Automated 0.6 %      Immature Granulocytes 0.2 %      Nucleated RBC 0.0 /100 WBC      Neutrophils Absolute 6.07 x10 3/uL       Lymphocytes Absolute Automated 1.88 x10 3/uL      Monocytes Absolute Automated 0.45 x10 3/uL      Eosinophils Absolute Automated 0.27 x10 3/uL      Basophils Absolute Automated 0.05 x10 3/uL      Immature Granulocytes Absolute 0.02 x10 3/uL      Absolute NRBC 0.00 x10 3/uL           Radiologic Studies -   Radiology Results (24 Hour)     Procedure Component Value Units Date/Time    XR Chest PA Or AP [[id number]] Collected: 03/12/20 2009    Order Status: Completed Updated: 03/12/20 2012    Narrative:      PA CHEST    CLINICAL HISTORY: Chest pain    COMPARISON: 10/15/2019    FINDINGS: No focal consolidation. No pleural effusion or pneumothorax.  Cardiac silhouette and pulmonary vascularity are within normal limits.    Visualized upper abdomen is unremarkable.      Impression:        No acute cardiopulmonary abnormality.    Drue Dun, MD   03/12/2020 8:10 PM      .    EKG:  Interpreted by the Emergency Physician.   Time Interpreted: 18:28 by me at 22:11   Rate: 74  Rhythm:  Normal Sinus  Ectopy:  None  Rate:  Normal  Conduction:  No blocks and QT prolongation 422  ST Segments:  Normal ST segments  T Waves:  Non-specific T Wave change(s)III aVr  Axis:  Normal  Clinical Impression:  No Acute ST T wave changes. New mild QT prolongation as compared to prior ECG 10/15/19. Otherwise no significant change.        Clinical Course in the Emergency Department     Consultations:  2:45 I have discussed this case with Dr. Nechama Guard, who accepts patient for admission and requests Observation IMCU bed. Heparin Drip.     Re-evaluations:  00:20  7/10 chest pain. Patient reports that she is able to taken oral dilaudid for pain.   02:00 Pain improved after medication.   3:00  Informed patient of positive rapid Covid.     Procedures:      Medical Decision Making   I am the first provider for this patient.    I reviewed the vital signs, available nursing notes, past medical history, past surgical history, family history and social  history.    Vital Signs-Reviewed the patient's vital signs.     Patient Vitals for the past 12 hrs:   BP Temp Pulse Resp   03/13/20 0230 -- --  77 15   03/13/20 0100 166/84 -- 78 20   03/12/20 2306 133/84 -- 74 16   03/12/20 1824 (!) 183/110 97.7 F (36.5 C) 90 22       Pulse Oximetry Interpretation: Normal SpO2: 95 % on RA    Provider's Notes:    00:40 Nuc Med tech called to inform that VQ scan not likely to occur until after 7:30 am. Secondary to radio-isotope in short supply and is housed centrally and needs to be couriered to our hospital and then mixed by nuc med pharmacist that does not arrive until 2 am.     01:00 Will admit and anti-coagulate pending VQ scan.     Patient counseled on diagnosis, treatment plan and plan for admission.        Diagnosis and Treatment Plan       Clinical Impression:   1. Chest pain, unspecified type    2. Elevated d-dimer    3. Acute cystitis without hematuria    4. COVID-19 virus infection             Ree Edman, PA  03/13/20 [id number]       Darcus Pester, MD  03/13/20 [phone]

## 2020-03-13 ENCOUNTER — Observation Stay: Payer: No Typology Code available for payment source

## 2020-03-13 ENCOUNTER — Encounter: Payer: Self-pay | Admitting: Internal Medicine

## 2020-03-13 DIAGNOSIS — U071 COVID-19: Secondary | ICD-10-CM

## 2020-03-13 DIAGNOSIS — I10 Essential (primary) hypertension: Secondary | ICD-10-CM

## 2020-03-13 DIAGNOSIS — R072 Precordial pain: Secondary | ICD-10-CM

## 2020-03-13 LAB — CBC
Absolute NRBC: 0 10*3/uL (ref 0.00–0.00)
Hematocrit: 38 % (ref 34.7–43.7)
Hgb: 11.7 g/dL (ref 11.4–14.8)
MCH: 27.1 pg (ref 25.1–33.5)
MCHC: 30.8 g/dL — ABNORMAL LOW (ref 31.5–35.8)
MCV: 88 fL (ref 78.0–96.0)
MPV: 12.9 fL — ABNORMAL HIGH (ref 8.9–12.5)
Nucleated RBC: 0 /100 WBC (ref 0.0–0.0)
Platelets: 194 10*3/uL (ref 142–346)
RBC: 4.32 10*6/uL (ref 3.90–5.10)
RDW: 16 % — ABNORMAL HIGH (ref 11–15)
WBC: 7.58 10*3/uL (ref 3.10–9.50)

## 2020-03-13 LAB — HEMOLYSIS INDEX
Hemolysis Index: 10 (ref 0–18)
Hemolysis Index: 6 (ref 0–18)

## 2020-03-13 LAB — COVID-19 (SARS-COV-2)
SARS CoV 2 Overall Result: DETECTED — AB
SARS CoV 2 Overall Result: POSITIVE — AB

## 2020-03-13 LAB — IHS D-DIMER: D-Dimer: 1.27 ug/mL FEU — ABNORMAL HIGH (ref 0.00–0.60)

## 2020-03-13 LAB — HEPATIC FUNCTION PANEL
ALT: 21 U/L (ref 0–55)
AST (SGOT): 18 U/L (ref 5–34)
Albumin/Globulin Ratio: 0.7 — ABNORMAL LOW (ref 0.9–2.2)
Albumin: 3.4 g/dL — ABNORMAL LOW (ref 3.5–5.0)
Alkaline Phosphatase: 113 U/L — ABNORMAL HIGH (ref 37–106)
Bilirubin Direct: 0.3 mg/dL (ref 0.0–0.5)
Bilirubin Indirect: 0.6 mg/dL (ref 0.2–1.0)
Bilirubin, Total: 0.9 mg/dL (ref 0.2–1.2)
Globulin: 4.6 g/dL — ABNORMAL HIGH (ref 2.0–3.6)
Protein, Total: 8 g/dL (ref 6.0–8.3)

## 2020-03-13 LAB — B-TYPE NATRIURETIC PEPTIDE
B-Natriuretic Peptide: <10 pg/mL (ref 0–100)
B-Natriuretic Peptide: <10 pg/mL (ref 0–100)

## 2020-03-13 LAB — TROPONIN I
Troponin I: <0.01 ng/mL (ref 0.00–0.05)
Troponin I: <0.01 ng/mL (ref 0.00–0.05)

## 2020-03-13 LAB — PT AND APTT
PT INR: 1.1 (ref 0.9–1.1)
PT: 12.6 s (ref 10.1–12.9)
PTT: 30 s (ref 27–39)

## 2020-03-13 LAB — PROCALCITONIN: Procalcitonin: <0.02 (ref 0.00–0.10)

## 2020-03-13 LAB — APTT: PTT: 31 s (ref 27–39)

## 2020-03-13 LAB — FERRITIN: Ferritin: 17.4 ng/mL (ref 4.60–204.00)

## 2020-03-13 LAB — ANTI-XA,UFH: Anti-Xa, UFH: <0.04

## 2020-03-13 LAB — C-REACTIVE PROTEIN: C-Reactive Protein: 0.5 mg/dL (ref 0.0–0.8)

## 2020-03-13 LAB — LACTATE DEHYDROGENASE: LDH: 221 U/L (ref 125–331)

## 2020-03-13 MED ORDER — CEPHALEXIN 500 MG PO CAPS
500.0000 mg | ORAL_CAPSULE | Freq: Three times a day (TID) | ORAL | Status: DC
Start: 2020-03-13 — End: 2020-03-13

## 2020-03-13 MED ORDER — TECHNETIUM TC 99M ALBUMIN AGGREGATED
5.0000 | Freq: Once | Status: AC | PRN
Start: 2020-03-13 — End: 2020-03-13
  Administered 2020-03-13: 08:00:00 5 via INTRAVENOUS

## 2020-03-13 MED ORDER — ATORVASTATIN CALCIUM 20 MG PO TABS
40.0000 mg | ORAL_TABLET | Freq: Every evening | ORAL | Status: DC
Start: 2020-03-13 — End: 2020-03-13

## 2020-03-13 MED ORDER — DEXTROMETHORPHAN POLISTIREX ER 30 MG/5ML PO SUER
30.0000 mg | Freq: Two times a day (BID) | ORAL | Status: DC | PRN
Start: 2020-03-13 — End: 2020-03-13

## 2020-03-13 MED ORDER — HYDRALAZINE HCL 20 MG/ML IJ SOLN
10.0000 mg | Freq: Four times a day (QID) | INTRAMUSCULAR | Status: DC | PRN
Start: 2020-03-13 — End: 2020-03-13

## 2020-03-13 MED ORDER — NALOXONE HCL 0.4 MG/ML IJ SOLN (WRAP)
0.2000 mg | INTRAMUSCULAR | Status: DC | PRN
Start: 2020-03-13 — End: 2020-03-13

## 2020-03-13 MED ORDER — PANTOPRAZOLE SODIUM 40 MG PO TBEC
40.0000 mg | DELAYED_RELEASE_TABLET | Freq: Every morning | ORAL | Status: DC
Start: 2020-03-13 — End: 2020-03-13
  Administered 2020-03-13: 09:00:00 40 mg via ORAL
  Filled 2020-03-13: qty 1

## 2020-03-13 MED ORDER — HYDROMORPHONE HCL 2 MG PO TABS
2.0000 mg | ORAL_TABLET | Freq: Once | ORAL | Status: AC
Start: 2020-03-13 — End: 2020-03-13
  Administered 2020-03-13: 02:00:00 2 mg via ORAL
  Filled 2020-03-13: qty 1

## 2020-03-13 MED ORDER — CEPHALEXIN 500 MG PO CAPS
500.0000 mg | ORAL_CAPSULE | Freq: Three times a day (TID) | ORAL | Status: DC
Start: 2020-03-13 — End: 2020-03-13
  Administered 2020-03-13: 14:00:00 500 mg via ORAL
  Filled 2020-03-13: qty 1

## 2020-03-13 MED ORDER — BENZONATATE 100 MG PO CAPS
100.0000 mg | ORAL_CAPSULE | Freq: Three times a day (TID) | ORAL | Status: DC | PRN
Start: 2020-03-13 — End: 2020-03-13

## 2020-03-13 MED ORDER — GLUCOSE 40 % PO GEL
15.0000 g | ORAL | Status: DC | PRN
Start: 2020-03-13 — End: 2020-03-13

## 2020-03-13 MED ORDER — DEXTROSE 50 % IV SOLN
12.5000 g | INTRAVENOUS | Status: DC | PRN
Start: 2020-03-13 — End: 2020-03-13

## 2020-03-13 MED ORDER — ACETAMINOPHEN 500 MG PO TABS
1000.0000 mg | ORAL_TABLET | Freq: Four times a day (QID) | ORAL | Status: DC | PRN
Start: 2020-03-13 — End: 2020-03-13

## 2020-03-13 MED ORDER — ALBUTEROL-IPRATROPIUM 2.5-0.5 (3) MG/3ML IN SOLN
3.0000 mL | Freq: Four times a day (QID) | RESPIRATORY_TRACT | Status: DC
Start: 2020-03-13 — End: 2020-03-13
  Administered 2020-03-13 (×2): 3 mL via RESPIRATORY_TRACT
  Filled 2020-03-13 (×2): qty 3

## 2020-03-13 MED ORDER — ONDANSETRON 4 MG PO TBDP
4.0000 mg | ORAL_TABLET | Freq: Four times a day (QID) | ORAL | Status: DC | PRN
Start: 2020-03-13 — End: 2020-03-13

## 2020-03-13 MED ORDER — GLUCAGON 1 MG IJ SOLR (WRAP)
1.0000 mg | INTRAMUSCULAR | Status: DC | PRN
Start: 2020-03-13 — End: 2020-03-13

## 2020-03-13 MED ORDER — CEPHALEXIN 500 MG PO CAPS
500.0000 mg | ORAL_CAPSULE | Freq: Once | ORAL | Status: AC
Start: 2020-03-13 — End: 2020-03-13
  Administered 2020-03-13: 02:00:00 500 mg via ORAL
  Filled 2020-03-13: qty 1

## 2020-03-13 MED ORDER — AMLODIPINE BESYLATE 5 MG PO TABS
5.0000 mg | ORAL_TABLET | Freq: Every day | ORAL | Status: DC
Start: 2020-03-13 — End: 2020-03-13
  Administered 2020-03-13: 09:00:00 5 mg via ORAL
  Filled 2020-03-13: qty 1

## 2020-03-13 MED ORDER — ONDANSETRON HCL 4 MG/2ML IJ SOLN
4.0000 mg | Freq: Four times a day (QID) | INTRAMUSCULAR | Status: DC | PRN
Start: 2020-03-13 — End: 2020-03-13

## 2020-03-13 MED ORDER — METOPROLOL TARTRATE 25 MG PO TABS
25.0000 mg | ORAL_TABLET | Freq: Once | ORAL | Status: AC
Start: 2020-03-13 — End: 2020-03-13
  Administered 2020-03-13: 05:00:00 25 mg via ORAL
  Filled 2020-03-13: qty 1

## 2020-03-13 MED ORDER — HYDROMORPHONE HCL 2 MG PO TABS
2.0000 mg | ORAL_TABLET | Freq: Once | ORAL | Status: AC
Start: 2020-03-13 — End: 2020-03-13
  Administered 2020-03-13: 05:00:00 2 mg via ORAL
  Filled 2020-03-13: qty 1

## 2020-03-13 MED ORDER — LEVETIRACETAM 500 MG PO TABS
500.0000 mg | ORAL_TABLET | Freq: Two times a day (BID) | ORAL | Status: DC
Start: 2020-03-13 — End: 2020-03-13
  Administered 2020-03-13: 09:00:00 500 mg via ORAL
  Filled 2020-03-13: qty 1

## 2020-03-13 MED ORDER — NITROGLYCERIN 0.4 MG SL SUBL
0.4000 mg | SUBLINGUAL_TABLET | SUBLINGUAL | Status: DC | PRN
Start: 2020-03-13 — End: 2020-03-13

## 2020-03-13 MED ORDER — HEPARIN (PORCINE) IN D5W 50-5 UNIT/ML-% IV SOLN (UNITS/KG/HR ONLY)
18.0000 [IU]/kg/h | INTRAVENOUS | Status: DC
Start: 2020-03-13 — End: 2020-03-13
  Administered 2020-03-13: 05:00:00 18 [IU]/kg/h via INTRAVENOUS
  Filled 2020-03-13: qty 500

## 2020-03-13 MED ORDER — ASPIRIN 81 MG PO CHEW
81.0000 mg | CHEWABLE_TABLET | Freq: Every day | ORAL | Status: DC
Start: 2020-03-13 — End: 2020-03-13
  Administered 2020-03-13: 09:00:00 81 mg via ORAL
  Filled 2020-03-13: qty 1

## 2020-03-13 MED ORDER — CEPHALEXIN 500 MG PO CAPS
500.0000 mg | ORAL_CAPSULE | Freq: Three times a day (TID) | ORAL | 0 refills | Status: AC
Start: 2020-03-13 — End: 2020-03-16

## 2020-03-13 MED ORDER — ALBUTEROL SULFATE HFA 108 (90 BASE) MCG/ACT IN AERS
2.0000 | INHALATION_SPRAY | RESPIRATORY_TRACT | 0 refills | Status: DC | PRN
Start: 2020-03-13 — End: 2022-07-01

## 2020-03-13 MED ORDER — DIPHENHYDRAMINE HCL 25 MG PO CAPS
25.0000 mg | ORAL_CAPSULE | Freq: Four times a day (QID) | ORAL | Status: DC | PRN
Start: 2020-03-13 — End: 2020-03-13
  Filled 2020-03-13: qty 1

## 2020-03-13 MED ORDER — MORPHINE SULFATE 2 MG/ML IJ/IV SOLN (WRAP)
1.0000 mg | Status: DC | PRN
Start: 2020-03-13 — End: 2020-03-13
  Administered 2020-03-13 (×2): 1 mg via INTRAVENOUS
  Filled 2020-03-13 (×2): qty 1

## 2020-03-13 NOTE — H&P (Signed)
[facility]      Patient: [name] Cook  Date: 03/12/2020   DOB: Jul 21, 1975  Admission Date: 03/12/2020   MRN: [id number]  Attending: Dr. Melida Gimenez Bennie Hind, PA-C         Chief Complaint   Patient presents with   . Shortness of Breath   . Chest Pain      History Gathered From: patient and chart review    HISTORY AND PHYSICAL     [name] Cook is a 45 y.o. female with a PMHx of CVA with residual right sided weakness, seizures, HTN, and HLD who presented with complaint of left sided upper chest "aching" radiating to her left shoulder and left upper arm with associated nausea, palpitations, and SOB both on exertion and laying flat. She reported 6 pillow orthopnea. She also endorsed lower abdominal discomfort for the past 3 days. In ED, labs were notable for elevated d-dimer (1.45) and COVID+. She has a history of anaphylaxis to contrast with CT and refused CTA with premedication. VQ scan was ordered but contrast is in low supply and won't be available until ~ 0730. Started on a heparin drip.     Of note, presented to [facility] on 03/01/20 for complaint of acute chest pain with associated diaphoresis, SOB, and nausea. She had VQ scan obtained which was negative. Tested positive for COVID on 03/09/20 when she presented to [facility] for chest pain.    Past Medical History:   Diagnosis Date   . Hypercholesteremia    . Hypertension    . Seizures    . Stroke     2015 and 2017, right side   . TIA (transient ischemic attack) 2017       Past Surgical History:   Procedure Laterality Date   . APPENDECTOMY (OPEN)     . CHOLECYSTECTOMY     . EGD, BIOPSY N/A 04/16/2017    Procedure: EGD, BIOPSY;  Surgeon: Pershing Proud, MD;  Location: [facility];  Service: Gastroenterology;  Laterality: N/A;   . HYSTERECTOMY         Prior to Admission medications    Medication Sig Start Date End Date Taking? Authorizing Provider   amLODIPine (NORVASC) 5 MG tablet Take 1 tablet (5 mg total) by mouth daily 10/18/19    Tirrell, Fransico Him, MD   aspirin 81 MG chewable tablet Chew 1 tablet (81 mg total) by mouth daily 09/19/17   Derek Mound, MD   atorvastatin (LIPITOR) 40 MG tablet Take by mouth.    [provider]   enoxaparin (LOVENOX) 40 MG/0.4ML Solution Inject 40 mg into the skin every 24 hours    [provider]   hydroCHLOROthiazide (HYDRODIURIL) 25 MG tablet Take 25 mg by mouth daily    [provider]   lactobacillus/streptococcus (RISAQUAD) Cap Take 1 capsule by mouth daily 10/18/19   Tirrell, Fransico Him, MD   LevETIRAcetam (KEPPRA PO) Take 500 mg by mouth 2 (two) times daily.       [provider]   lidocaine (LIDODERM) 5 % Place 1 patch onto the skin every 24 hours Remove & Discard patch within 12 hours or as directed by MD 10/18/19   Doyle Askew, Fransico Him, MD   lisinopril (PRINIVIL,ZESTRIL) 40 MG tablet Take 40 mg by mouth daily.    [provider]   multivitamin (MULTIVITAMIN) Tab Take 1 tablet by mouth daily 10/17/19   Tirrell, Fransico Him, MD   pantoprazole (PROTONIX) 40 MG tablet Take 1  tablet (40 mg total) by mouth daily 10/30/18   Karlton Lemon, MD   sodium hypochlorite (H-CHLOR 12) 0.125 % Solution Irrigate with as directed daily 10/17/19   Tirrell, Fransico Him, MD   vitamin C (ASCORBIC ACID) 500 MG tablet Take 1 tablet (500 mg total) by mouth daily 10/17/19   Tirrell, Fransico Him, MD   Zinc 220 (50 Zn) MG Cap Take 1 tablet by mouth daily 10/17/19   Tirrell, Fransico Him, MD       Allergies   Allergen Reactions   . Contrast [Iodinated Diagnostic Agents] Anaphylaxis     Patient woke up in ICU after having CT with contrast, last remembers being in CT   . Fioricet [Butalbital-Apap-Caffeine] Hives   . Bentyl [Dicyclomine] Edema   . Fentanyl    . Iodine    . Motrin [Ibuprofen] Swelling   . Percocet [Oxycodone-Acetaminophen]    . Shellfish-Derived Products    . Toradol [Ketorolac Tromethamine]    . Tramadol    . Tylenol [Acetaminophen] Hives       CODE STATUS: FULL    PRIMARY CARE MD: Pcp, None,  MD    Family History   Problem Relation Age of Onset   . Myocardial Infarction Father 8   . Deep vein thrombosis Father    . No known problems Mother        Social History     Tobacco Use   . Smoking status: Never Smoker   . Smokeless tobacco: Never Used   Vaping Use   . Vaping Use: Never used   Substance Use Topics   . Alcohol use: No   . Drug use: No       REVIEW OF SYSTEMS   Positive for: as per HPI; mild BLE edema.  Negative for: cough, wheezing, dizziness, abdominal pain, N/V, painful urination, difficulty urinating, myalgias, arthralgias.  All ROS completed and otherwise negative.    PHYSICAL EXAM     Vital Signs (most recent): BP (!) 194/109   Pulse 74   Temp 97.7 F (36.5 C) (Oral)   Resp 15   Wt 146.1 kg (322 lb)   LMP 10/15/2018 (Exact Date)   SpO2 97%   BMI 46.20 kg/m   Constitutional: well developed, morbidly obese female in NAD. Patient speaks freely in full sentences.   HEENT: NC/AT, PERRL, no scleral icterus  Neck: trachea midline, supple, no cervical or supraclavicular lymphadenopathy or masses  Cardiovascular: RRR, normal S1 S2, no murmurs, gallops, palpable thrills, non-displaced PMI.  Respiratory: Normal rate. No retractions or increased work of breathing. Clear to auscultation bilaterally.  Gastrointestinal: +BS, non-distended, soft, non-tender, no rebound or guarding  Genitourinary: no suprapubic tenderness  Musculoskeletal: able to move all extremities. Trace, nonpitting BLE edema in extremities. DP and radial pulses 2+ and symmetric.  Skin exam: warm, dry. No rashes or wounds on exposed surfaces.  Neurologic: EOM grossly intact, CN 2-12 grossly intact. no gross motor or sensory deficits  Psychiatric: AAOx3, affect and mood appropriate. The patient is alert, interactive, appropriate.      LABS & IMAGING     Recent Results (from the past 24 hour(s))   CBC and differential    Collection Time: 03/12/20  7:53 PM   Result Value Ref Range    WBC 8.74 3.10 - 9.50 x10 3/uL    Hgb 11.7 11.4 -  14.8 g/dL    Hematocrit 16.1 09.6 - 43.7 %    Platelets 181 142 - 346 x10 3/uL  RBC 4.29 3.90 - 5.10 x10 6/uL    MCV 89.5 78.0 - 96.0 fL    MCH 27.3 25.1 - 33.5 pg    MCHC 30.5 (L) 31.5 - 35.8 g/dL    RDW 16 (H) 11 - 15 %    MPV 12.4 8.9 - 12.5 fL    Neutrophils 69.5 None %    Lymphocytes Automated 21.5 None %    Monocytes 5.1 None %    Eosinophils Automated 3.1 None %    Basophils Automated 0.6 None %    Immature Granulocytes 0.2 None %    Nucleated RBC 0.0 0.0 - 0.0 /100 WBC    Neutrophils Absolute 6.07 1.10 - 6.33 x10 3/uL    Lymphocytes Absolute Automated 1.88 0.42 - 3.22 x10 3/uL    Monocytes Absolute Automated 0.45 0.21 - 0.85 x10 3/uL    Eosinophils Absolute Automated 0.27 0.00 - 0.44 x10 3/uL    Basophils Absolute Automated 0.05 0.00 - 0.08 x10 3/uL    Immature Granulocytes Absolute 0.02 0.00 - 0.07 x10 3/uL    Absolute NRBC 0.00 0.00 - 0.00 x10 3/uL   Comprehensive metabolic panel    Collection Time: 03/12/20  7:53 PM   Result Value Ref Range    Glucose 134 (H) 70 - 100 mg/dL    BUN 16.1 7.0 - 09.6 mg/dL    Creatinine 0.9 0.6 - 1.0 mg/dL    Sodium 045 409 - 811 mEq/L    Potassium 4.0 3.5 - 5.1 mEq/L    Chloride 105 100 - 111 mEq/L    CO2 27 22 - 29 mEq/L    Calcium 9.1 8.5 - 10.5 mg/dL    Protein, Total 8.3 6.0 - 8.3 g/dL    Albumin 3.7 3.5 - 5.0 g/dL    AST (SGOT) 18 5 - 34 U/L    ALT 25 0 - 55 U/L    Alkaline Phosphatase 118 (H) 37 - 106 U/L    Bilirubin, Total 0.6 0.2 - 1.2 mg/dL    Globulin 4.6 (H) 2.0 - 3.6 g/dL    Albumin/Globulin Ratio 0.8 (L) 0.9 - 2.2    Anion Gap 9.0 5.0 - 15.0   Troponin I    Collection Time: 03/12/20  7:53 PM   Result Value Ref Range    Troponin I <0.01 0.00 - 0.05 ng/mL   Hemolysis index    Collection Time: 03/12/20  7:53 PM   Result Value Ref Range    Hemolysis Index -1 (L) 0 - 18   GFR    Collection Time: 03/12/20  7:53 PM   Result Value Ref Range    EGFR >60.0    B-type Natriuretic Peptide    Collection Time: 03/12/20  7:53 PM   Result Value Ref Range    B-Natriuretic  Peptide <10 0 - 100 pg/mL   D-Dimer    Collection Time: 03/12/20  7:53 PM   Result Value Ref Range    D-Dimer 1.45 (H) 0.00 - 0.60 ug/mL FEU   COVID-19 (SARS-CoV-2) ([place] Standard test)    Collection Time: 03/12/20 10:22 PM    Specimen: Nasopharynx; Nasopharyngeal Swab   Result Value Ref Range    Purpose of COVID testing Diagnostic -PUI     SARS-CoV-2 Specimen Source Nasopharyngeal    UA Reflex to Micro - Reflex to Culture    Collection Time: 03/12/20 10:22 PM   Result Value Ref Range    Urine Type Urine, Clean Ca     Color, UA  Yellow Colorless - Yellow    Clarity, UA Sl Cloudy (A) Clear - Hazy    Specific Gravity UA 1.023 1.001 - 1.035    Urine pH 5.0 5.0 - 8.0    Leukocyte Esterase, UA Moderate (A) Negative    Nitrite, UA Negative Negative    Protein, UR 30 (A) Negative    Glucose, UA Negative Negative    Ketones UA Negative Negative    Urobilinogen, UA 2.0 0.2 - 2.0 mg/dL    Bilirubin, UA Negative Negative    Blood, UA Negative Negative    RBC, UA 11 - 25 (A) 0 - 5 /hpf    WBC, UA 6 - 10 (A) 0 - 5 /hpf    Squamous Epithelial Cells, Urine 11 - 25 0 - 25 /hpf    Urine Mucus Present None   COVID-19 (SARS-COV-2) ([place] Rapid)    Collection Time: 03/13/20  1:26 AM    Specimen: Nasopharynx; Nasopharyngeal Swab   Result Value Ref Range    Purpose of COVID testing Screening     SARS-CoV-2 Specimen Source Nasopharyngeal     SARS CoV 2 Overall Result Positive (A)    Troponin I    Collection Time: 03/13/20  1:26 AM   Result Value Ref Range    Troponin I <0.01 0.00 - 0.05 ng/mL   PT/APTT    Collection Time: 03/13/20  3:36 AM   Result Value Ref Range    PT 12.6 10.1 - 12.9 sec    PT INR 1.1 0.9 - 1.1    PTT 30 27 - 39 sec   APTT    Collection Time: 03/13/20  3:36 AM   Result Value Ref Range    PTT 31 27 - 39 sec   Anti-Xa, UFH    Collection Time: 03/13/20  3:36 AM   Result Value Ref Range    Anti-Xa, UFH <0.04 See notes   CBC without differential    Collection Time: 03/13/20  3:36 AM   Result Value Ref Range    WBC 7.58 3.10  - 9.50 x10 3/uL    Hgb 11.7 11.4 - 14.8 g/dL    Hematocrit 96.0 45.4 - 43.7 %    Platelets 194 142 - 346 x10 3/uL    RBC 4.32 3.90 - 5.10 x10 6/uL    MCV 88.0 78.0 - 96.0 fL    MCH 27.1 25.1 - 33.5 pg    MCHC 30.8 (L) 31.5 - 35.8 g/dL    RDW 16 (H) 11 - 15 %    MPV 12.9 (H) 8.9 - 12.5 fL    Nucleated RBC 0.0 0.0 - 0.0 /100 WBC    Absolute NRBC 0.00 0.00 - 0.00 x10 3/uL       MICROBIOLOGY:  Blood Culture:none  Urine Culture: in process  Antibiotics Started: Keflex.    IMAGING:  XR Chest PA Or AP    Result Date: 03/12/2020  No acute cardiopulmonary abnormality. Drue Dun, MD  03/12/2020 8:10 PM      CARDIAC:  EKG Interpretation (upon my review):  NSR, HR 74 bpm, QT/QTc 422/468. No ST elevation.    Markers:  Recent Labs   Lab 03/13/20  0126 03/12/20  1953   Troponin I <0.01 <0.01       EMERGENCY DEPARTMENT COURSE:  Orders Placed This Encounter   Procedures   . COVID-19 (SARS-CoV-2) ([place] Standard test)   . COVID-19 (SARS-COV-2) ([place] Rapid)   . Urine culture   . XR Chest  PA Or AP   . NM Pulmonary Vent/Perf (VQ Scan)   . CBC and differential   . Comprehensive metabolic panel   . Troponin I   . Hemolysis index   . GFR   . UA Reflex to Micro - Reflex to Culture   . B-type Natriuretic Peptide   . D-Dimer   . Troponin I   . PT/APTT   . APTT   . Anti-Xa, UFH   . Anti-Xa, UFH   . CBC without differential   . CBC without differential   . Vital Signs   . Cardiac monitoring (ED Only)   . Vital Signs-Repeat   . Notify physician If 2 successive anti-Xa <0.1 IU/mL   . Notify physician (specify) Signs/Symptoms of Bleeding   . Notify physician (specify) Allergic to Heparin/Pork Products   . Assess if patient received anticoag last 12 hour   . Apply Anticoagulation Arm Band   . Education: Anticoagulation   . Education: Heparin   . Telemetry 24 Hour Protocol   . ED Unit Sec Comm Order   . Saline lock IV   . Admit to Observation   . BLEEDING PRECAUTIONS       ASSESSMENT & PLAN     [name] Cook is a 45 y.o. female admitted under  observation with Chest pain.    Patient Active Hospital Problem List:    Chest pain   Elevated d-dimer  - EKG in NSR, no ST elevation  - Trop I <0.01  - D-dimer: 1.45; VQ scan ordered to evaluate for PE due to contrast allergy (patient refused premedication).  - Monitor on telemetry.   - Trend troponins.  - Started on heparin drip pending VQ scan findings.    COVID-19   - Vaccinated with Moderna; was not yet eligible for booster.  - Maintaining SpO2 97% on room air. Not a candidate for remdesivir.  - Continue supportive therapies.  - Trend infectious biomarkers.  - Encourage incentive spirometry.     Acute uncomplicated cystitis  - Follow up urine culture.  - Continue Keflex x3 days.    Hypertension - continue Norvasc, HCTZ, and lisinopril  Hyperlipidemia - continue atorvastatin  History of seizures - continue Keppra  BMI 46.2    Nutrition  Heart healthy     DVT/VTE Prophylaxis  SCD's, heparin gtt.    Anticipated medical stability for discharge: 24 hours    Service status/Reason for ongoing hospitalization: R/O PE  Anticipated Discharge Needs: TBD    Signed,  Bennie Hind, PA-C    03/13/2020 4:51 AM

## 2020-03-13 NOTE — Discharge Instr - AVS First Page (Addendum)
Reason for your Hospital Admission:  Chest Pain  COVID-19       Instructions for after your discharge:  Please continue to take your medications as prescribed. I sent an albuterol inhaler to your pharmacy to use as needed for shortness of breath. As we discussed please follow-up with your PCP on discharge.

## 2020-03-13 NOTE — ED Notes (Signed)
Patient refused nitro SL for chest pain and agreed for morphine.

## 2020-03-13 NOTE — ED Notes (Signed)
 EMERGENCY DEPARTMENT  ED NURSING NOTE FOR THE RECEIVING INPATIENT NURSE   ED NURSE Ivor Reining [phone]   ED CHARGE RN Beth   ADMISSION INFORMATION   [name] Cook is a 45 y.o. female admitted with an ED diagnosis of:    1. Chest pain, unspecified type    2. Elevated d-dimer    3. Acute cystitis without hematuria    4. COVID-19 virus infection         Isolation: Contact  and Airborne Covid Positive   Allergies: Contrast [iodinated diagnostic agents], Fioricet [butalbital-apap-caffeine], Bentyl [dicyclomine], Fentanyl, Iodine, Motrin [ibuprofen], Percocet [oxycodone-acetaminophen], Shellfish-derived products, Toradol [ketorolac tromethamine], Tramadol, and Tylenol [acetaminophen]   Holding Orders confirmed? Yes   Belongings Documented? Yes   Home medications sent to pharmacy confirmed? N/A   NURSING CARE   Patient Comes From:   Mental Status: Home Independent  alert and oriented   ADL: Independent with all ADLs   Ambulation: no difficulty   Pertinent Information  and Safety Concerns: Pt p/w SOB, chest pressure and HTN. Covid positive. needs VQ scan. Heparin Gtt started in ED     CT / NIH   CT Head ordered on this patient?  N/A   NIH/Dysphagia assessment done prior to admission? N/A   VITAL SIGNS (at the time of this note)      Vitals:    03/13/20 0512   BP: (!) 192/104   Pulse: 83   Resp: 17   Temp: 98 F (36.7 C)   SpO2: 98%

## 2020-03-13 NOTE — ED Notes (Signed)
Venous Doppler is backed up and exam will be done later per technician

## 2020-03-13 NOTE — Consults (Signed)
[facility] [place] Cardiology Consultation Note  [facility] Spectralink [phone]  [facility] Spectralink [phone]  Office Number [phone]  MD LINE [phone]         Referring Physician: Dr. Doyle Askew    Reason for Consultation: chest pain    There is an apparent anaphylactic reaction to contrast and it is reported as an allergy.    Assessment and Plan:     45 yo female with multiple presentations for CP in past, now with COVID positive (presented with SOB worse with exertion and chest discomfort (which stays the same and does not worsen).    1. COVID POSITIVE  - Vaccinated with Moderna; was not yet eligible for booster.  - Maintaining SpO2 97% on room air.  - Continue supportive therapies.  - Encourage incentive spirometry    2. Chest pain (see prior extensive workups in 2017 and 2019)  3. Borderline prolonged QT  4. Normal LV function mild LVH 2019      Plan/Recommendations:  Trend troponins as you have; at this time, no myocardial involvement noted  Treatment of COVID to primary team  Replete electrolytes; monitor QT by telemetry; avoid QT prolonging medications if able, but if needs treatment for COVID, then please monitor QT on telemetry and keep patient on telemetry  May need additional testing pending her clinical course but has had extensive prior testing as recent as 2019    Thank you for consulting our group.    Patient Active Problem List    Diagnosis Date Noted   . *HChest pain [R07.9] 06/17/2015   . Wound dehiscence [T81.30XA] 10/17/2019   . Hypertensive urgency [I16.0] 10/17/2019   . History of seizure disorder [Z86.69] 10/17/2019   . Cellulitis of other specified site [L03.818] 10/15/2019   . Weakness [R53.1] 10/29/2018   . Abdominal pain [R10.9] 09/05/2018   . Headache [R51.9] 03/05/2018   . Nonspecific abnormal electroencephalogram (EEG) [R94.01] 03/04/2018   . Right hemiparesis [G81.91] 09/15/2017   . Numbness and tingling [R20.0,  R20.2] 04/17/2017   . Hemorrhoids [K64.9] 04/17/2017   . HLD (hyperlipidemia) [E78.5] 04/16/2017   . Hematemesis [K92.0] 04/15/2017   . Blood loss anemia [D50.0] 04/15/2017   . Tension type headache [G44.209] 05/13/2016   . History of stroke [Z86.73] 05/13/2016   . Class 2 obesity in adult [E66.9] 05/13/2016   . Facial droop [R29.810] 05/11/2016   . Seizures [R56.9] 08/20/2015   . Stroke-like symptoms [R29.90] 08/19/2015            Chief Complaint: chest pain    History of Present Illness: We are asked by Dr. Mikey Bussing to provide cardiac consultation regarding this patient who presents Covid positive and with chest pain.    Patient has had multiple presentations to the hospital for chest pain all of which have been thoroughly evaluated on prior admissions including stress test.  Please let me allow an opportunity to highlight these prior presentations:    She presented with chest pain and with a history of hyperlipidemia in 2017.  She underwent stress test and was discharged after the testing was reportedly negative.  She was seen by our group at that time.    She then presented in 2019 and was seen by [facility].  At that time she underwent ischemic evaluation due to reported chest pain symptoms and underwent a Lexiscan which was a low risk test felt to have some breast attenuation artifact present.  Other etiologies of  chest pain were advised to be evaluated at that time.    She now presents Covid positive.  Patient was not seen due to COVID and no physical exam due to her Covid positivity.  Information was taken from the EMR as well as discussion with other consultants and the hospitalist.    EKG on presentation shows normal sinus rhythm nonspecific T wave abnormalities borderline prolonged QT interval no ischemic findings no pathologic Q waves.    Patient's RFs include: high BMI (46), hyperlipidemia, HTN    Her ER workup included V/Q scan for positive D-dimer.  Tn <0.01 x2, BNP <10.    Of note, presented to  [facility] on 03/01/20 for complaint of acute chest pain with associated diaphoresis, SOB, and nausea. She had VQ scan obtained which was negative. Tested positive for COVID on 03/09/20 when she presented to [facility] for chest pain.  Discharged.      * Left ventricular systolic function is normal with an ejection fraction by  Biplane Method of Discs of  53 %.    * There is mild concentric left ventricular hypertrophy.    * The left ventricle is normal in size.    * Left ventricular segmental wall motion is normal.    * Left ventricular diastolic filling parameters demonstrate normal diastolic  function.    * The left atrium is normal in size.    * There is no aortic stenosis.    * There is no aortic regurgitation.    * The pulmonic valve is not well visualized.    * There is no mitral regurgitation.    * No pulmonary hypertension with estimated right ventricular systolic  pressure of  31.86 mmHg.      Findings  Left Ventricle    The left ventricle is normal in size.    There is mild concentric left ventricular hypertrophy.    Left ventricular systolic function is normal with an ejection fraction by  Biplane Method of Discs of 53 %.    Left ventricular segmental wall motion is normal.    Left ventricular diastolic filling parameters demonstrate normal diastolic  function.  Right Ventricle    The right ventricular cavity size is normal in size.    Normal right ventricular systolic function.      Left Atrium    The left atrium is normal in size.    Right Atrium    The right atrium is normal in size.    Atrial Septum    No evidence of interatrial shunt by color Doppler.      Aortic Valve    The aortic valve is tricuspid.    There is no aortic stenosis.    There is no aortic regurgitation.    Pulmonary Valve    The pulmonic valve is not well visualized.    There is no pulmonic regurgitation.    Mitral Valve    The mitral valve is structurally normal.    There is no mitral  regurgitation.    Tricuspid Valve    The tricuspid valve is structurally normal.    There is trace tricuspid regurgitation.    No pulmonary hypertension with estimated right ventricular systolic pressure  of 31.86 mmHg.      Pericardium / Pleural Effusion    No pericardial effusion visualized.    Inferior Vena Cava    The IVC is normal in size with > 50% respiratory variance consistent with  normal RA pressure of 3  mmHg.    Aorta    The aortic root is normal in size.    The ascending aorta is normal in size.      Patient Active Problem List    Diagnosis Date Noted   . Wound dehiscence 10/17/2019   . Hypertensive urgency 10/17/2019   . History of seizure disorder 10/17/2019   . Cellulitis of other specified site 10/15/2019   . Weakness 10/29/2018   . Abdominal pain 09/05/2018   . Headache 03/05/2018   . Nonspecific abnormal electroencephalogram (EEG) 03/04/2018   . Right hemiparesis 09/15/2017   . Numbness and tingling 04/17/2017   . Hemorrhoids 04/17/2017   . HLD (hyperlipidemia) 04/16/2017   . Hematemesis 04/15/2017   . Blood loss anemia 04/15/2017   . Tension type headache 05/13/2016   . History of stroke 05/13/2016   . Class 2 obesity in adult 05/13/2016   . Facial droop 05/11/2016   . Seizures 08/20/2015   . Stroke-like symptoms 08/19/2015   . Chest pain 06/17/2015     Past Medical History:   Diagnosis Date   . Hypercholesteremia    . Hypertension    . Seizures    . Stroke     2015 and 2017, right side   . TIA (transient ischemic attack) 2017      Past Surgical History:   Procedure Laterality Date   . APPENDECTOMY (OPEN)     . CHOLECYSTECTOMY     . EGD, BIOPSY N/A 04/16/2017    Procedure: EGD, BIOPSY;  Surgeon: Pershing Proud, MD;  Location: [facility];  Service: Gastroenterology;  Laterality: N/A;   . HYSTERECTOMY        (Not in a hospital admission)    Allergies   Allergen Reactions   . Contrast [Iodinated Diagnostic Agents] Anaphylaxis     Patient woke up in ICU after having CT with contrast, last  remembers being in CT   . Fioricet [Butalbital-Apap-Caffeine] Hives   . Bentyl [Dicyclomine] Edema   . Fentanyl    . Iodine    . Motrin [Ibuprofen] Swelling   . Percocet [Oxycodone-Acetaminophen]    . Shellfish-Derived Products    . Toradol [Ketorolac Tromethamine]    . Tramadol    . Tylenol [Acetaminophen] Hives      Current Facility-Administered Medications   Medication Dose Route Frequency Provider Last Rate Last Admin   . albuterol-ipratropium (DUO-NEB) 2.5-0.5(3) mg/3 mL nebulizer 3 mL  3 mL Nebulization Q6H [facility] Folkner, Kathryn A, PA   3 mL at 03/13/20 1349   . amLODIPine (NORVASC) tablet 5 mg  5 mg Oral Daily Bronson Ing A, PA   5 mg at 03/13/20 6045   . aspirin chewable tablet 81 mg  81 mg Oral Daily Bronson Ing A, PA   81 mg at 03/13/20 4098   . atorvastatin (LIPITOR) tablet 40 mg  40 mg Oral QHS Folkner, Kathryn A, PA       . benzonatate (TESSALON) capsule 100 mg  100 mg Oral TID PRN Bronson Ing A, PA       . cephalexin (KEFLEX) capsule 500 mg  500 mg Oral TID Melida Gimenez, Donalda Ewings, MD   500 mg at 03/13/20 1341   . dextromethorphan polistirex ER (DELSYM) 30 MG/5ML suspension 30 mg  30 mg Oral Q12H PRN Folkner, Kathryn A, PA       . dextrose (GLUCOSE) 40 % oral gel 15 g of glucose  15 g of glucose Oral PRN Bennie Hind, PA  And   . dextrose 50 % bolus 12.5 g  12.5 g Intravenous PRN Bronson Ing A, PA        And   . glucagon (rDNA) (GLUCAGEN) injection 1 mg  1 mg Intramuscular PRN Bronson Ing A, PA       . diphenhydrAMINE (BENADRYL) capsule 25 mg  25 mg Oral Q6H PRN Bronson Ing A, PA       . hydrALAZINE (APRESOLINE) injection 10 mg  10 mg Intravenous Q6H PRN Folkner, Kathryn A, PA       . levETIRAcetam (KEPPRA) tablet 500 mg  500 mg Oral BID Bronson Ing A, PA   500 mg at 03/13/20 0924   . morphine injection 1 mg  1 mg Intravenous Q4H PRN Bronson Ing A, PA   1 mg at 03/13/20 1341   . naloxone (NARCAN) injection 0.2 mg  0.2 mg Intravenous PRN Bronson Ing A,  PA       . nitroglycerin (NITROSTAT) SL tablet 0.4 mg  0.4 mg Sublingual Q5 Min PRN Folkner, Kathryn A, PA       . ondansetron (ZOFRAN-ODT) disintegrating tablet 4 mg  4 mg Oral Q6H PRN Bronson Ing A, PA        Or   . ondansetron (ZOFRAN) injection 4 mg  4 mg Intravenous Q6H PRN Folkner, Kathryn A, PA       . pantoprazole (PROTONIX) EC tablet 40 mg  40 mg Oral QAM AC Folkner, Kathryn A, PA   40 mg at 03/13/20 1610     Current Outpatient Medications   Medication Sig Dispense Refill   . amLODIPine (NORVASC) 5 MG tablet Take 1 tablet (5 mg total) by mouth daily 30 tablet 0   . aspirin 81 MG chewable tablet Chew 1 tablet (81 mg total) by mouth daily 30 tablet 0   . atorvastatin (LIPITOR) 40 MG tablet Take by mouth.     . LevETIRAcetam (KEPPRA PO) Take 500 mg by mouth 2 (two) times daily.        Marland Kitchen lidocaine (LIDODERM) 5 % Place 1 patch onto the skin every 24 hours Remove & Discard patch within 12 hours or as directed by MD 30 each 0   . lisinopril (PRINIVIL,ZESTRIL) 40 MG tablet Take 40 mg by mouth daily.     . multivitamin (MULTIVITAMIN) Tab Take 1 tablet by mouth daily  0   . pantoprazole (PROTONIX) 40 MG tablet Take 1 tablet (40 mg total) by mouth daily 30 tablet 0   . sodium hypochlorite (H-CHLOR 12) 0.125 % Solution Irrigate with as directed daily 473 mL 0     Social History     Tobacco Use   . Smoking status: Never Smoker   . Smokeless tobacco: Never Used   Substance Use Topics   . Alcohol use: No        Family History:   Family History   Problem Relation Age of Onset   . Myocardial Infarction Father 1   . Deep vein thrombosis Father    . No known problems Mother         Review of Systems    Constitutional:  No weight loss, fever, or night sweats  HEENT:  No vertigo, visual loss, or diplopia  Cardiovascular:  See HPI  Respiratory:  No cough, sputum, asthma, or wheezing  Gastrointestinal:  No nausea, heartburn, abdominal pain, constipation or diarrhea  Genitourinary:  No dysuria, frequency, or  urgency  Musculoskeletal:  No back pain, muscular weakness  or joint swelling  Endocrine:   No cold intolerance, heat intolerance, or polyuria  Neurological:  No syncope, seizures, numbness or tingling of hands, numbness or tingling of feet, unsteady gait or speech difficulties  Skin: No edema, color change or chronic ulcers    Objective:   BP 138/77   Pulse 67   Temp 97.9 F (36.6 C) (Oral)   Resp 15   Wt 146.1 kg (322 lb)   LMP 10/15/2018 (Exact Date)   SpO2 95%   BMI 46.20 kg/m      Patient was not directly examined due to COVID positive.  Elements of exam taken from other providers who have examined the patient.      Physical Exam  Vitals and nursing note reviewed.   Constitutional:       Appearance: She is obese.   HENT:      Head: Normocephalic and atraumatic.      Right Ear: External ear normal.      Left Ear: External ear normal.      Nose: Nose normal.      Mouth/Throat:      Mouth: Mucous membranes are moist.   Eyes:      Conjunctiva/sclera: Conjunctivae normal.      Pupils: Pupils are equal, round, and reactive to light.   Neck:      Vascular: No hepatojugular reflux or JVD.   Cardiovascular:      Rate and Rhythm: Normal rate and regular rhythm.      Pulses: Normal pulses.      Heart sounds: Normal heart sounds. No murmur heard.  No gallop.    Pulmonary:      Effort: Pulmonary effort is normal.      Breath sounds: Normal breath sounds. No decreased breath sounds, wheezing, rhonchi or rales.   Chest:      Chest wall: No tenderness.   Musculoskeletal:      Cervical back: Normal range of motion and neck supple.      Right lower leg: No edema.      Left lower leg: No edema.   Lymphadenopathy:      Cervical: No cervical adenopathy.   Skin:     General: Skin is warm and dry.   Neurological:      Mental Status: She is alert and oriented to person, place, and time.   Psychiatric:         Mood and Affect: Mood and affect normal.              Cardiac Data Review  Echo Results     None          Labs:   Lab  Results   Component Value Date    WBC 7.58 03/13/2020    HGB 11.7 03/13/2020    HCT 38.0 03/13/2020    MCV 88.0 03/13/2020    PLT 194 03/13/2020     No results found for: CKTOTAL, CKMB, CKMBINDEX  No results found for: CKTOTAL  Lab Results   Component Value Date    BUN 12.0 03/12/2020    NA 141 03/12/2020    K 4.0 03/12/2020    CL 105 03/12/2020    CO2 27 03/12/2020       Chest X-Ray:  Radiology Results (24 Hour)     Procedure Component Value Units Date/Time    NM Spect CT Fusion Sgl Area Sgl Day [[id number]] Collected: 03/13/20 0903    Order Status: Completed Updated: 03/13/20 0623  Narrative:      INDICATION:  To evaluate for possible pulmonary embolism with positive  d-dimer.    TECHNIQUE:  Imaging was performed throughout the chest following  intravenous menstruation 5.5 mCi of technetium 99 MAA for perfusion.  Imaging was performed with SPECT-CT fusion    COMPARISON:No relevant prior studies were available for review.    FINDINGS:  There is felt to be overall homogeneous distribution of  radiopharmaceutical throughout all visualized lung fields. There are no  findings for focal segmental perfusion defects. Correlation with the CT  PET fusion demonstrates no focal consolidation or other abnormality.      Impression:       There are no findings to suggest evidence for pulmonary  embolism.    Lorenda Peck, MD   03/13/2020 9:05 AM    XR Chest PA Or AP [[id number]] Collected: 03/12/20 2009    Order Status: Completed Updated: 03/12/20 2012    Narrative:      PA CHEST    CLINICAL HISTORY: Chest pain    COMPARISON: 10/15/2019    FINDINGS: No focal consolidation. No pleural effusion or pneumothorax.  Cardiac silhouette and pulmonary vascularity are within normal limits.    Visualized upper abdomen is unremarkable.      Impression:        No acute cardiopulmonary abnormality.    Drue Dun, MD   03/12/2020 8:10 PM              Renard Hamper, MD, MS, [facility], [facility]  Office Number: [phone]  [address]  [place], [place] [address]    [address]  [address]  [place], [place] [address]

## 2020-03-13 NOTE — ED Notes (Signed)
This RN manually verified Heparin dose and is as documented in [facility].

## 2020-03-13 NOTE — Discharge Summary (Signed)
[facility]      Patient: [name] Cook  Admission Date: 03/12/2020   DOB: 06/05/1975  Discharge Date: 03/13/2020    MRN: [id number]  Discharge Attending: Dr. Doyle Askew, Lorenda Cahill, PA-C     Referring Physician: Marisa Sprinkles, MD  PCP: Marisa Sprinkles, MD       DISCHARGE SUMMARY     Discharge Information   Admission Diagnosis:   Chest pain    Discharge Diagnosis:   Active Hospital Problems    Diagnosis   . COVID-19   . HTN (hypertension)   . HLD (hyperlipidemia)   . History of stroke   . Class 2 obesity in adult   . Chest pain        Admission Condition: Guarded  Discharge Condition: Stable  Consultants: Cardiology  Functional Status: Ambulatory  Discharged to: Home    Discharge Medications:     Medication List      START taking these medications    albuterol sulfate HFA 108 (90 Base) MCG/ACT inhaler  Commonly known as: PROVENTIL  Inhale 2 puffs into the lungs every 4 (four) hours as needed for Wheezing or Shortness of Breath     cephalexin 500 MG capsule  Commonly known as: KEFLEX  Take 1 capsule (500 mg total) by mouth 3 (three) times daily for 3 days        CONTINUE taking these medications    amLODIPine 5 MG tablet  Commonly known as: NORVASC  Take 1 tablet (5 mg total) by mouth daily     aspirin 81 MG chewable tablet  Chew 1 tablet (81 mg total) by mouth daily     atorvastatin 40 MG tablet  Commonly known as: LIPITOR     KEPPRA PO     lidocaine 5 %  Commonly known as: LIDODERM  Place 1 patch onto the skin every 24 hours Remove & Discard patch within 12 hours or as directed by MD     lisinopril 40 MG tablet  Commonly known as: ZESTRIL     multivitamin Tabs  Take 1 tablet by mouth daily     pantoprazole 40 MG tablet  Commonly known as: PROTONIX  Take 1 tablet (40 mg total) by mouth daily     sodium hypochlorite 0.125 % Soln  Commonly known as: H-CHLOR 12  Irrigate with as directed daily           Where to Get Your Medications      These medications were sent to [id number] - [place], [place] - [address] [facility]  [address] [place], [place] [address]    Phone: [phone]    albuterol sulfate HFA 108 (90 Base) MCG/ACT inhaler   cephalexin 500 MG capsule             Hospital Course   Presentation History     [name] Cook is a 45 y.o. female with a PMHx of CVA with residual right sided weakness, seizures, HTN, and HLD who presented with complaint of left sided upper chest "aching" radiating to her left shoulder and left upper arm with associated nausea, palpitations, and SOB both on exertion and laying flat. She reported 6 pillow orthopnea. She also endorsed lower abdominal discomfort for the past 3 days. In ED, labs were notable for elevated d-dimer (1.45) and COVID+. She has a history of anaphylaxis to contrast with CT and refused CTA with premedication. VQ scan was ordered but contrast is in low  supply and won't be available until ~ 0730. Started on a heparin drip.     Of note, presented to [facility] on 03/01/20 for complaint of acute chest pain with associated diaphoresis, SOB, and nausea. She had VQ scan obtained which was negative. Tested positive for COVID on 03/09/20 when she presented to [facility] for chest pain.      See HPI for details.    Hospital Course by problem    Chest pain   Elevated d-dimer  - EKG in NSR, no ST elevation  - Trop I <0.01 x 3  - D-dimer: 1.45; VQ scan without evidence of PE. No BLE edema or pain, no suspicion for DVT.  - Heparin drip discontinued   - Appreciate Cardiology evaluation, no further work-up needed. Patient with extensive cardiac work-up in 2017 and 2019.    COVID-19   - Vaccinated with Moderna; was not yet eligible for booster.  - CXR without acute findings. CRP, Procalcitonin, and Ferritin within normal limits  - Maintaining SpO2 97% on room air. Not a candidate for remdesivir.  - Discharge with albuterol inhaler to use as needed  - Follow-up with PCP    Acute uncomplicated cystitis  - Continue Keflex x3  days.    Hypertension - continue Norvasc, HCTZ, and lisinopril  Hyperlipidemia - continue atorvastatin  History of seizures - continue Keppra  BMI 46.2      Procedures/Imaging:   Upon my review:     XR Chest PA Or AP    Result Date: 03/12/2020  No acute cardiopulmonary abnormality. Drue Dun, MD  03/12/2020 8:10 PM    NM Spect CT Fusion Sgl Area Sgl Day    Result Date: 03/13/2020   There are no findings to suggest evidence for pulmonary embolism. Lorenda Peck, MD  03/13/2020 9:05 AM           Best Practices   Was the patient admitted with either a CHF Exacerbation or Pneumonia? No     Progress Note/Physical Exam at Discharge     Subjective: Patient reports ongoing chest pain this afternoon, discussed likely secondary to COVID-19 infection. Encourage rest on discharge with PCP follow-up.    Vitals:    03/13/20 1310 03/13/20 1519 03/13/20 1522 03/13/20 1720   BP: 138/77  140/67 117/70   Pulse: 67  69 69   Resp: 15  19 16    Temp:  98.7 F (37.1 C)  98.1 F (36.7 C)   TempSrc:  Oral  Oral   SpO2: 95%  95% 92%   Weight:         General: NAD, AAOx3  HEENT: perrla, eomi, sclera anicteric, OP: Clear, MMM  Neck: supple, FROM, no LAD  Cardiovascular: RRR, no m/r/g  Lungs: CTAB, no w/r/r  Abdomen: soft, +BS, NT/ND, no masses, no g/r  Extremities: no C/C/E  Skin: no rashes or lesions noted  Neuro: CN 2-12 intact; No Focal neurological deficits       Diagnostics     Labs/Studies Pending at Discharge: urine culture    Last Labs   Recent Labs   Lab 03/13/20  0336 03/12/20  1953   WBC 7.58 8.74   RBC 4.32 4.29   Hgb 11.7 11.7   Hematocrit 38.0 38.4   MCV 88.0 89.5   Platelets 194 181       Recent Labs   Lab 03/12/20  1953   Sodium 141   Potassium 4.0   Chloride 105   CO2 27  BUN 12.0   Creatinine 0.9   Glucose 134*   Calcium 9.1       Microbiology Results (last 15 days)     Procedure Component Value Units Date/Time    COVID-19 (SARS-COV-2) ([place] [facility]) [[id number]]  (Abnormal) Collected: 03/13/20 0126    Order Status:  Completed Specimen: Nasopharyngeal Swab from Nasopharynx Updated: 03/13/20 0230     Purpose of COVID testing Screening     SARS-CoV-2 Specimen Source Nasopharyngeal     SARS CoV 2 Overall Result Positive     Comment: Critical Covid-19 results called to [id number].  Readback confirmed by SA at__ 03/13/2020  02:29  Test performed using the Abbott ID NOW EUA assay.  Please see Fact Sheets for patients and providers located at:  http://www.rice.biz/    This test is for the qualitative detection of SARS-CoV-2  (COVID19) nucleic acid. Viral nucleic acids may persist in vivo,  independent of viability. Detection of viral nucleic acid does  not imply the presence of infectious virus, or that virus  nucleic acid is the cause of clinical symptoms. Negative  results should be treated as presumptive and, if inconsistent  with clinical signs and symptoms or necessary for patient  management, should be tested with an alternative molecular  assay. Negative results do not preclude SARS-CoV-2 infection  and should not be used as the sole basis for patient  management decisions. Invalid results may be due to inhibiting  substances in the specimen and recollection should occur.         Narrative:      o Collect and clearly label specimen type:  o Upper respiratory specimen: One Nasopharyngeal Dry Swab NO  Transport Media.  o Hand deliver to laboratory ASAP  Indication for testing->Extended care facility admission to  semi private room  Screening    COVID-19 (SARS-CoV-2) Verne Carrow Standard test) [[id number]] Collected: 03/12/20 2222    Order Status: Completed Specimen: Nasopharyngeal Swab from Nasopharynx Updated: 03/13/20 0104     Purpose of COVID testing Diagnostic -PUI     SARS-CoV-2 Specimen Source Nasopharyngeal    Narrative:      o Collect and clearly label specimen type:  o PREFERRED-Upper respiratory specimen: One Nasopharyngeal  Swab in Transport Media.  o Hand deliver to laboratory ASAP  Diagnostic -PUI    Urine  culture [[id number]] Collected: 03/12/20 2222    Order Status: Sent Specimen: Urine, Clean Catch Updated: 03/13/20 0320    Narrative:      Indications for Urine Culture:->Suprapubic Pain/Tenderness or  Dysuria           Patient Instructions   Discharge Diet: Heart Healthy  Discharge Activity: Avoid strenuous activity    Follow Up Appointment:   Follow-up Information     PCP Follow up.    Why: As discussed please follow-up with your PCP in the next week or sooner if symptoms worsen           Pcp, None, MD .                        Time spent examining patient, discussing with patient/family regarding hospital course, chart review, reconciling medications and discharge planning: >30 minutes.    Leonides Schanz  Lorenda Cahill, PA    6:30 PM 03/13/2020

## 2020-03-17 DIAGNOSIS — Z5321 Procedure and treatment not carried out due to patient leaving prior to being seen by health care provider: Secondary | ICD-10-CM | POA: Insufficient documentation

## 2020-03-17 LAB — ECG 12-LEAD
Atrial Rate: 74 {beats}/min
P Axis: 36 degrees
P-R Interval: 122 ms
Q-T Interval: 422 ms
QRS Duration: 84 ms
QTC Calculation (Bezet): 468 ms
R Axis: 55 degrees
T Axis: 36 degrees
Ventricular Rate: 74 {beats}/min

## 2020-09-14 ENCOUNTER — Emergency Department: Payer: Medicaid Managed Care Other

## 2020-09-14 ENCOUNTER — Observation Stay
Admission: EM | Admit: 2020-09-14 | Discharge: 2020-09-18 | Disposition: A | Discharge status: Home / Self Care | Payer: Medicaid Managed Care Other | Attending: Internal Medicine | Admitting: Internal Medicine

## 2020-09-14 ENCOUNTER — Observation Stay: Payer: Medicaid Managed Care Other

## 2020-09-14 DIAGNOSIS — I160 Hypertensive urgency: Principal | ICD-10-CM | POA: Insufficient documentation

## 2020-09-14 DIAGNOSIS — M47812 Spondylosis without myelopathy or radiculopathy, cervical region: Secondary | ICD-10-CM | POA: Insufficient documentation

## 2020-09-14 DIAGNOSIS — Z79899 Other long term (current) drug therapy: Secondary | ICD-10-CM | POA: Insufficient documentation

## 2020-09-14 DIAGNOSIS — M4802 Spinal stenosis, cervical region: Secondary | ICD-10-CM | POA: Insufficient documentation

## 2020-09-14 DIAGNOSIS — G40909 Epilepsy, unspecified, not intractable, without status epilepticus: Secondary | ICD-10-CM | POA: Insufficient documentation

## 2020-09-14 DIAGNOSIS — R29810 Facial weakness: Secondary | ICD-10-CM | POA: Insufficient documentation

## 2020-09-14 DIAGNOSIS — M2578 Osteophyte, vertebrae: Secondary | ICD-10-CM | POA: Insufficient documentation

## 2020-09-14 DIAGNOSIS — R29898 Other symptoms and signs involving the musculoskeletal system: Secondary | ICD-10-CM

## 2020-09-14 DIAGNOSIS — R079 Chest pain, unspecified: Secondary | ICD-10-CM | POA: Insufficient documentation

## 2020-09-14 DIAGNOSIS — I10 Essential (primary) hypertension: Secondary | ICD-10-CM | POA: Insufficient documentation

## 2020-09-14 DIAGNOSIS — I639 Cerebral infarction, unspecified: Secondary | ICD-10-CM

## 2020-09-14 DIAGNOSIS — J45909 Unspecified asthma, uncomplicated: Secondary | ICD-10-CM | POA: Insufficient documentation

## 2020-09-14 DIAGNOSIS — R531 Weakness: Secondary | ICD-10-CM | POA: Insufficient documentation

## 2020-09-14 DIAGNOSIS — G43909 Migraine, unspecified, not intractable, without status migrainosus: Secondary | ICD-10-CM | POA: Insufficient documentation

## 2020-09-14 DIAGNOSIS — Z8673 Personal history of transient ischemic attack (TIA), and cerebral infarction without residual deficits: Secondary | ICD-10-CM | POA: Insufficient documentation

## 2020-09-14 LAB — TROPONIN I: Troponin I: <0.01 ng/mL (ref 0.00–0.05)

## 2020-09-14 LAB — COMPREHENSIVE METABOLIC PANEL
ALT: 25 U/L (ref 0–55)
AST (SGOT): 20 U/L (ref 5–34)
Albumin/Globulin Ratio: 0.9 (ref 0.9–2.2)
Albumin: 4.1 g/dL (ref 3.5–5.0)
Alkaline Phosphatase: 136 U/L — ABNORMAL HIGH (ref 37–117)
Anion Gap: 6 (ref 5.0–15.0)
BUN: 11 mg/dL (ref 7.0–19.0)
Bilirubin, Total: 0.9 mg/dL (ref 0.2–1.2)
CO2: 26 mEq/L (ref 22–29)
Calcium: 9.6 mg/dL (ref 8.5–10.5)
Chloride: 109 mEq/L (ref 100–111)
Creatinine: 1 mg/dL (ref 0.6–1.0)
Globulin: 4.5 g/dL — ABNORMAL HIGH (ref 2.0–3.6)
Glucose: 115 mg/dL — ABNORMAL HIGH (ref 70–100)
Potassium: 3.7 mEq/L (ref 3.5–5.1)
Protein, Total: 8.6 g/dL — ABNORMAL HIGH (ref 6.0–8.3)
Sodium: 141 mEq/L (ref 136–145)

## 2020-09-14 LAB — PT/INR
PT INR: 1.1 (ref 0.9–1.1)
PT: 12.6 s (ref 10.1–12.9)

## 2020-09-14 LAB — MAN DIFF ONLY
Band Neutrophils Absolute: 0 10*3/uL (ref 0.00–1.00)
Band Neutrophils: 0 %
Basophils Absolute Manual: 0.18 10*3/uL — ABNORMAL HIGH (ref 0.00–0.08)
Basophils Manual: 2 %
Eosinophils Absolute Manual: 0.18 10*3/uL (ref 0.00–0.44)
Eosinophils Manual: 2 %
Lymphocytes Absolute Manual: 2.91 10*3/uL (ref 0.42–3.22)
Lymphocytes Manual: 32 %
Monocytes Absolute: 0.18 10*3/uL — ABNORMAL LOW (ref 0.21–0.85)
Monocytes Manual: 2 %
Neutrophils Absolute Manual: 5.63 10*3/uL (ref 1.10–6.33)
Segmented Neutrophils: 62 %

## 2020-09-14 LAB — CBC AND DIFFERENTIAL
Absolute NRBC: 0 10*3/uL (ref 0.00–0.00)
Hematocrit: 39.3 % (ref 34.7–43.7)
Hgb: 12.5 g/dL (ref 11.4–14.8)
MCH: 27.7 pg (ref 25.1–33.5)
MCHC: 31.8 g/dL (ref 31.5–35.8)
MCV: 87.1 fL (ref 78.0–96.0)
MPV: 12.6 fL — ABNORMAL HIGH (ref 8.9–12.5)
Nucleated RBC: 0 /100 WBC (ref 0.0–0.0)
Platelets: 157 10*3/uL (ref 142–346)
RBC: 4.51 10*6/uL (ref 3.90–5.10)
RDW: 14 % (ref 11–15)
WBC: 9.08 10*3/uL (ref 3.10–9.50)

## 2020-09-14 LAB — CELL MORPHOLOGY
Cell Morphology: ABNORMAL — AB
Platelet Estimate: NORMAL

## 2020-09-14 LAB — GFR: EGFR: >60

## 2020-09-14 LAB — COVID-19 (SARS-COV-2): SARS CoV-2: NEGATIVE

## 2020-09-14 LAB — GLUCOSE WHOLE BLOOD - POCT: Whole Blood Glucose POCT: 94 mg/dL (ref 70–100)

## 2020-09-14 LAB — HCG QUANTITATIVE: hCG, Quant.: <2.4 m[IU]/mL

## 2020-09-14 MED ORDER — NITROGLYCERIN 0.4 MG SL SUBL
0.4000 mg | SUBLINGUAL_TABLET | SUBLINGUAL | Status: DC | PRN
Start: 2020-09-14 — End: 2020-09-18
  Filled 2020-09-14: qty 25

## 2020-09-14 MED ORDER — ASPIRIN 81 MG PO CHEW
81.0000 mg | CHEWABLE_TABLET | Freq: Every day | ORAL | Status: DC
Start: 2020-09-15 — End: 2020-09-18
  Administered 2020-09-15 – 2020-09-17 (×3): 81 mg via ORAL
  Filled 2020-09-14 (×4): qty 1

## 2020-09-14 MED ORDER — MAGNESIUM SULFATE IN D5W 1-5 GM/100ML-% IV SOLN
1.0000 g | INTRAVENOUS | Status: DC | PRN
Start: 2020-09-14 — End: 2020-09-18

## 2020-09-14 MED ORDER — LORAZEPAM 2 MG/ML IJ SOLN
1.0000 mg | Freq: Once | INTRAMUSCULAR | Status: AC
Start: 2020-09-14 — End: 2020-09-14
  Administered 2020-09-14: 1 mg via INTRAVENOUS
  Filled 2020-09-14: qty 1

## 2020-09-14 MED ORDER — ASPIRIN 81 MG PO TBEC
81.0000 mg | DELAYED_RELEASE_TABLET | Freq: Every evening | ORAL | Status: DC
Start: 2020-09-14 — End: 2020-09-14

## 2020-09-14 MED ORDER — ALUM & MAG HYDROXIDE-SIMETH 200-200-20 MG/5ML PO SUSP
30.0000 mL | Freq: Two times a day (BID) | ORAL | Status: DC | PRN
Start: 2020-09-14 — End: 2020-09-18

## 2020-09-14 MED ORDER — DEXTROSE 10 % IV BOLUS
25.0000 g | INTRAVENOUS | Status: DC | PRN
Start: 2020-09-14 — End: 2020-09-18

## 2020-09-14 MED ORDER — LIDOCAINE VISCOUS HCL 2 % MT SOLN
10.0000 mL | Freq: Two times a day (BID) | OROMUCOSAL | Status: DC | PRN
Start: 2020-09-14 — End: 2020-09-18

## 2020-09-14 MED ORDER — LEVETIRACETAM 500 MG PO TABS
500.0000 mg | ORAL_TABLET | Freq: Two times a day (BID) | ORAL | Status: DC
Start: 2020-09-14 — End: 2020-09-18
  Administered 2020-09-15 – 2020-09-17 (×7): 500 mg via ORAL
  Filled 2020-09-14 (×8): qty 1

## 2020-09-14 MED ORDER — NALOXONE HCL 0.4 MG/ML IJ SOLN (WRAP)
0.2000 mg | INTRAMUSCULAR | Status: DC | PRN
Start: 2020-09-14 — End: 2020-09-18

## 2020-09-14 MED ORDER — ATORVASTATIN CALCIUM 40 MG PO TABS
40.0000 mg | ORAL_TABLET | Freq: Every evening | ORAL | Status: DC
Start: 2020-09-14 — End: 2020-09-18
  Administered 2020-09-15 – 2020-09-17 (×4): 40 mg via ORAL
  Filled 2020-09-14 (×4): qty 1

## 2020-09-14 MED ORDER — HYDRALAZINE HCL 20 MG/ML IJ SOLN
10.0000 mg | Freq: Four times a day (QID) | INTRAMUSCULAR | Status: DC | PRN
Start: 2020-09-14 — End: 2020-09-18
  Administered 2020-09-14 – 2020-09-15 (×2): 10 mg via INTRAVENOUS
  Filled 2020-09-14 (×2): qty 1

## 2020-09-14 MED ORDER — AMLODIPINE BESYLATE 5 MG PO TABS
5.0000 mg | ORAL_TABLET | Freq: Every day | ORAL | Status: DC
Start: 2020-09-15 — End: 2020-09-18
  Administered 2020-09-15 – 2020-09-17 (×3): 5 mg via ORAL
  Filled 2020-09-14 (×4): qty 1

## 2020-09-14 MED ORDER — LISINOPRIL 10 MG PO TABS
40.0000 mg | ORAL_TABLET | Freq: Every day | ORAL | Status: DC
Start: 2020-09-15 — End: 2020-09-18
  Administered 2020-09-15 – 2020-09-17 (×3): 40 mg via ORAL
  Filled 2020-09-14 (×4): qty 4

## 2020-09-14 MED ORDER — POTASSIUM & SODIUM PHOSPHATES 280-160-250 MG PO PACK
2.0000 | PACK | ORAL | Status: DC | PRN
Start: 2020-09-14 — End: 2020-09-18

## 2020-09-14 MED ORDER — ALBUTEROL SULFATE (2.5 MG/3ML) 0.083% IN NEBU
5.0000 mg | INHALATION_SOLUTION | Freq: Four times a day (QID) | RESPIRATORY_TRACT | Status: DC | PRN
Start: 2020-09-14 — End: 2020-09-18

## 2020-09-14 MED ORDER — ASPIRIN 300 MG RE SUPP
300.0000 mg | Freq: Once | RECTAL | Status: DC
Start: 2020-09-14 — End: 2020-09-18

## 2020-09-14 MED ORDER — ONDANSETRON 4 MG PO TBDP
4.0000 mg | ORAL_TABLET | Freq: Four times a day (QID) | ORAL | Status: DC | PRN
Start: 2020-09-14 — End: 2020-09-18

## 2020-09-14 MED ORDER — GLUCAGON 1 MG IJ SOLR (WRAP)
1.0000 mg | INTRAMUSCULAR | Status: DC | PRN
Start: 2020-09-14 — End: 2020-09-18

## 2020-09-14 MED ORDER — MELATONIN 3 MG PO TABS
3.0000 mg | ORAL_TABLET | Freq: Every evening | ORAL | Status: DC | PRN
Start: 2020-09-14 — End: 2020-09-18

## 2020-09-14 MED ORDER — POTASSIUM CHLORIDE 10 MEQ/100ML IV SOLN
10.0000 meq | INTRAVENOUS | Status: DC | PRN
Start: 2020-09-14 — End: 2020-09-18
  Administered 2020-09-16: 10 meq via INTRAVENOUS

## 2020-09-14 MED ORDER — PANTOPRAZOLE SODIUM 40 MG PO TBEC
40.0000 mg | DELAYED_RELEASE_TABLET | Freq: Every morning | ORAL | Status: DC
Start: 2020-09-15 — End: 2020-09-18
  Administered 2020-09-15 – 2020-09-18 (×4): 40 mg via ORAL
  Filled 2020-09-14 (×4): qty 1

## 2020-09-14 MED ORDER — HYDRALAZINE HCL 20 MG/ML IJ SOLN
10.0000 mg | Freq: Once | INTRAMUSCULAR | Status: AC
Start: 2020-09-14 — End: 2020-09-15
  Administered 2020-09-15: 10 mg via INTRAVENOUS
  Filled 2020-09-14: qty 1

## 2020-09-14 MED ORDER — DEXTROSE 50 % IV SOLN
25.0000 g | INTRAVENOUS | Status: DC | PRN
Start: 2020-09-14 — End: 2020-09-18

## 2020-09-14 MED ORDER — ONDANSETRON HCL 4 MG/2ML IJ SOLN
4.0000 mg | Freq: Four times a day (QID) | INTRAMUSCULAR | Status: DC | PRN
Start: 2020-09-14 — End: 2020-09-18
  Administered 2020-09-15: 13:00:00 4 mg via INTRAVENOUS
  Filled 2020-09-14: qty 2

## 2020-09-14 MED ORDER — ENOXAPARIN SODIUM 40 MG/0.4ML IJ SOSY
40.0000 mg | PREFILLED_SYRINGE | Freq: Every day | INTRAMUSCULAR | Status: DC
Start: 2020-09-15 — End: 2020-09-18
  Administered 2020-09-15 – 2020-09-17 (×3): 40 mg via SUBCUTANEOUS
  Filled 2020-09-14 (×4): qty 0.4

## 2020-09-14 MED ORDER — DEXTROSE 5% IV BOLUS
250.0000 mL | INTRAVENOUS | Status: DC | PRN
Start: 2020-09-14 — End: 2020-09-18

## 2020-09-14 MED ORDER — POTASSIUM CHLORIDE CRYS ER 20 MEQ PO TBCR
0.0000 meq | EXTENDED_RELEASE_TABLET | ORAL | Status: DC | PRN
Start: 2020-09-14 — End: 2020-09-18
  Administered 2020-09-16: 40 meq via ORAL
  Filled 2020-09-14: qty 2

## 2020-09-14 NOTE — ED Notes (Signed)
[facility] EMERGENCY DEPARTMENT  ED NURSING NOTE FOR THE RECEIVING INPATIENT NURSE   ED NURSE Milinda Hirschfeld 1610   ED CHARGE RN [phone]   ADMISSION INFORMATION   [name] Cook is a 45 y.o. female admitted with a diagnosis of:    No diagnosis found.     Isolation: None   Allergies: Contrast [iodinated diagnostic agents], Fioricet [butalbital-apap-caffeine], Bentyl [dicyclomine], Fentanyl, Iodine, Motrin [ibuprofen], Percocet [oxycodone-acetaminophen], Shellfish-derived products, Toradol [ketorolac tromethamine], Tramadol, and Tylenol [acetaminophen]   Holding Orders confirmed? Yes   Belongings Documented? Yes   Home medications sent to pharmacy confirmed? No   NURSING CARE   Patient Comes From:   Mental Status: Home Independent  alert and oriented   ADL: Independent with all ADLs   Ambulation: no difficulty   Pertinent Information  and Safety Concerns: N/A     COVID Test sent to lab? Yes   VITAL SIGNS   Time BP Temp Pulse Resp SpO2   1652 190/101 98.6 61 20 100   CT / NIH   CT Head ordered on this patient?  Yes   NIH/Dysphagia assessment done prior to admission? Yes   PERSONAL PROTECTIVE EQUIPMENT   Face Shield and Gloves   LAB RESULTS   Labs Reviewed   CBC AND DIFFERENTIAL - Abnormal; Notable for the following components:       Result Value    MPV 12.6 (*)     All other components within normal limits   COMPREHENSIVE METABOLIC PANEL - Abnormal; Notable for the following components:    Glucose 115 (*)     Protein, Total 8.6 (*)     Alkaline Phosphatase 136 (*)     Globulin 4.5 (*)     All other components within normal limits   MANUAL DIFFERENTIAL - Abnormal; Notable for the following components:    Monocytes Absolute 0.18 (*)     Basophils Absolute Manual 0.18 (*)     All other components within normal limits   CELL MORPHOLOGY - Abnormal; Notable for the following components:    Cell Morphology Abnormal (*)     Ovalocytes Present (*)     All other components within normal limits   COVID-19 (SARS-COV-2)     Narrative:     o Collect and clearly label specimen type:  o Upper respiratory specimen: One Nasopharyngeal Dry Swab NO  Transport Media.  o Hand deliver to laboratory ASAP  Indication for testing->Extended care facility admission to  semi private room  Screening  Res[place]uled by [id number] at 09/14/2020 17:59 Reason: .  Rescheduled by [id number] at 09/14/2020 18:00 Reason: .   TROPONIN I   PT/INR   GFR   HCG QUANTITATIVE   GLUCOSE WHOLE BLOOD - POCT          Ticket to Ride Printed: Yes

## 2020-09-14 NOTE — Consults (Signed)
[facility] Consultation Note                                       Date Time: 09/14/20 4:34 PM  Patient Name: [name] Cook  Requesting Physician: Elliot Cousin, MD  Date of Admission: 09/14/2020    CC / Reason for Consultation: facial droop        Assessment:     45y/o woman with hx of prior CVA (per patient), HTN, HLD, seizures, migraines presenting with wake up deficits of left facial weakness, RLE weakness and right sided sensory deficits (involving face, arm and leg).     CT head imaging reviewed, no acute process noted.     Based on history of multiple risk factors cannot r/o acute stroke but based on atypical nature of her symptoms and repeated prior negative evaluations have lower suspicion for acute stroke. Not a tPA candidate based on LKW >4.5 hours ago.     Patient to be admitted for report of chest pain.       Plan:     --ASA 325mg  x 1 dose in ED now  --repeat MRI likely low yield but will check as patient being admitted for cardiac workup  --PT/OT evaluation      HPI   [name] Cook is a 45 y.o. female with hx of prior stroke HTN, HLD, seizures, migraines presenting with wake up deficits of left facial weakness. LKW ~2300 on 07/04 when she went to bed. Reports being woken up around 1000 today with severe chest pain and upon going to the mirror she noted left facial droop. Denies any trouble speaking, no double vision, no vertigo, nausea or vomiting. She also notes some RLE weakness. Denies any headache, no recent head or neck trauma. No ear fullness or pain. No tinnitus. No history of a fib, no history of blood clots.     Has had multiple admissions/ER visits in the past for similar symptoms, always with negative MRI findings. Since 09/25/2013 she has had 12 MRI brains which were negative for acute infarct.     Onset: wake up deficits at 1000 on 07/05  Timing: wake up deficits, LKW 2300 on 07/04  Location: left facial droop  Quality: weakness  Severity: mild  Exacerbating factors:  none  Alleviating factors: none  Associated Symptoms: RLE weakness and numbness  Pertinent Negatives: per HPI    Past Medical Hx     Past Medical History:   Diagnosis Date    Hypercholesteremia     Hypertension     Seizures     Stroke     2015 and 2017, right side    TIA (transient ischemic attack) 2017          Past Surgical Hx:     Past Surgical History:   Procedure Laterality Date    APPENDECTOMY (OPEN)      CHOLECYSTECTOMY      EGD, BIOPSY N/A 04/16/2017    Procedure: EGD, BIOPSY;  Surgeon: Pershing Proud, MD;  Location: [facility];  Service: Gastroenterology;  Laterality: N/A;    HYSTERECTOMY          Family Medical History:      Family History   Problem Relation Age of Onset    Myocardial Infarction Father 52    Deep vein thrombosis Father     No known problems Mother        Social  Hx     Social History     Socioeconomic History    Marital status: Single   Tobacco Use    Smoking status: Never    Smokeless tobacco: Never   Vaping Use    Vaping Use: Never used   Substance and Sexual Activity    Alcohol use: No    Drug use: No       Meds     Home :   Prior to Admission medications    Medication Sig Start Date End Date Taking? Authorizing Provider   albuterol sulfate HFA (PROVENTIL) 108 (90 Base) MCG/ACT inhaler Inhale 2 puffs into the lungs every 4 (four) hours as needed for Wheezing or Shortness of Breath 03/13/20   Lorenda Cahill, PA   amLODIPine (NORVASC) 5 MG tablet Take 1 tablet (5 mg total) by mouth daily 10/18/19   Tirrell, Fransico Him, MD   aspirin 81 MG chewable tablet Chew 1 tablet (81 mg total) by mouth daily 09/19/17   Doy Hutching D, MD   atorvastatin (LIPITOR) 40 MG tablet Take by mouth.    [provider]   LevETIRAcetam (KEPPRA PO) Take 500 mg by mouth 2 (two) times daily.       [provider]   lidocaine (LIDODERM) 5 % Place 1 patch onto the skin every 24 hours Remove & Discard patch within 12 hours or as directed by MD 10/18/19   Doyle Askew, Fransico Him, MD   lisinopril  (PRINIVIL,ZESTRIL) 40 MG tablet Take 40 mg by mouth daily.    [provider]   multivitamin (MULTIVITAMIN) Tab Take 1 tablet by mouth daily 10/17/19   Tirrell, Fransico Him, MD   pantoprazole (PROTONIX) 40 MG tablet Take 1 tablet (40 mg total) by mouth daily 10/30/18   Karlton Lemon, MD   sodium hypochlorite (H-CHLOR 12) 0.125 % Solution Irrigate with as directed daily 10/17/19   Tirrell, Fransico Him, MD      Inpatient :       Allergies    Contrast [iodinated diagnostic agents], Fioricet [butalbital-apap-caffeine], Bentyl [dicyclomine], Fentanyl, Iodine, Motrin [ibuprofen], Percocet [oxycodone-acetaminophen], Shellfish-derived products, Toradol [ketorolac tromethamine], Tramadol, and Tylenol [acetaminophen]      Review of Systems     All other systems were reviewed and are negative except for that mentioned in the HPI    Physical Exam:   Temp:  [98.6 F (37 C)] 98.6 F (37 C)  Heart Rate:  [78] 78  Resp Rate:  [20] 20  BP: (181)/(124) 181/124       General: in no acute distress. Cooperative with the exam  Neck: supple  CVS: warm and well perfused  Resp: no respiratory distress  Extremities: no pedal edema, no rashes noted    Neurological Examination:  MSE: A&Ox3, speech fluent without word-finding difficulty or paraphasic errors  CN: Pupils 4-->34mm b/l, EOMI, no nystagmus, VFFC, reports sensation in V1-V3 intact to LT/temp, flattening of L NLF though face appears to be pulling to the right, forehead raises symmetrically, eye closure intact, no dysarthria.  Motor: No pronator drift in bilateral UE, RLE with drift but able to maintain antigravity  Sensory: LT diminished on the RUE and RLE  Coord: FNF, RAM without limb ataxia or dysmetria.  Gait: deferred  Reflexes: DTRs 2+ throughout, toes down        Labs:     Results       Procedure Component Value Units Date/Time    CBC and differential [[id number]] Collected: 09/14/20 1620  Specimen: Blood Updated: 09/14/20 1620    Comprehensive metabolic panel [[id number]]  Collected: 09/14/20 1620    Specimen: Blood Updated: 09/14/20 1620    Troponin I [[id number]] Collected: 09/14/20 1620    Specimen: Blood Updated: 09/14/20 1620    Prothrombin time/INR [[id number]] Collected: 09/14/20 1620    Specimen: Blood Updated: 09/14/20 1620            Rads:     Results for orders placed or performed during the hospital encounter of 01/28/19   CT Head without Contrast    Narrative    CLINICAL INDICATION:  Facial weakness, left-sided facial droop    TECHNIQUE:  Axial noncontrast CT images were obtained from the skull  base to the vertex. A combination of a automatic exposure control,  adjustment of the mA and/or kV  according to patient size and/or use of  iterative reconstruction technique was utilized.      COMPARISON:  10/29/2018    INTERPRETATION: Subarachnoid spaces and ventricles are within normal  limits for age. There is no acute intracranial hemorrhage, extra-axial  collection, or mass-effect. Gray-white differentiation is maintained.  Visualized paranasal sinuses and mastoid air cells are clear.      Impression      No acute intracranial abnormality.    Wyatt Portela, MD   01/28/2019 11:49 AM   Results for orders placed or performed during the hospital encounter of 10/29/18   MRI Brain WO Contrast    Narrative    MRI BRAIN WO CONTRAST:10/30/2018 2:55 PM     CLINICAL HISTORY: right sided weakness    TECHNIQUE: Noncontrast Brain MR        COMPARISON: No change from MRI BRAIN WO CONTRAST with report dated  09/05/2018 6:24 PM.  .    FINDINGS:     Extra axial spaces: Normal in size and morphology for the patient's age.  Hemorrhage: None.  Ventricular system: Normal in size and morphology for the patient's age.  Basal cisterns: Normal.  Cerebral parenchyma: Tiny focal region of high T2 and FLAIR signal  intensity in the left frontal lobe on image 17 series 7 with a second  similar findings seen higher in the left frontal lobe on image 20 series  7, stable. No new findings   Midline shift:  None.  Cerebellum: Normal.  Brainstem: Normal.    OTHER:    Calvarium: Normal.  Vascular system: Normal.  Visualized Paranasal sinuses: Clear.  Visualized Orbits: Normal.  Visualized upper cervical spine: Normal.  Sella and skull base: Normal.      Impression    No acute abnormality. No change from 09/05/2018          Laurena Slimmer, MD   10/30/2018 3:57 PM   MRA Head (intracranial) Without Contrast    Narrative    MR ANGIOGRAM HEAD WO CONTRAST    CLINICAL INDICATION:   weakness R sided    COMPARISON: No change from MR ANGIOGRAM HEAD WO CONTRAST with report  dated 05/13/2016 9:10 AM.      TECHNIQUE: Routine MRA of the brain without contrast with MIP images.    FINDINGS:  There is no evidence for focal hemodynamically significant  stenosis of either intracranial internal carotid artery. The anterior,  middle, and posterior cerebral arteries appear unremarkable bilaterally.  The vertebrobasilar system appears unremarkable without focal  hemodynamically significant stenosis. There is no MR evidence to suggest  large aneurysm of the circle of Willis.    Impression     Unremarkable MRA of  the brain.    Laurena Slimmer, MD   10/30/2018 4:08 PM     Results for orders placed or performed during the hospital encounter of 08/18/15   MRI Brain With / Without Contrast    Narrative    MRI BRAIN W WO CONTRAST  CLINICAL HISTORY:Right facial droop    COMPARISON: None available.    TECHNIQUE: Multiplanar multisequence imaging performed. Following 10 cc  Gadavist axial and coronal T1-weighted images were obtained.     INTERPRETATION: Small hyperintense foci noted in the left frontal  subcortical white matter and the centrum semiovale. No other focal  abnormality.  No area of restricted diffusion identified. No acute  hemorrhage, mass lesion or mass effect. The brainstem, cerebellum and  the craniocervical junction are within normal limits. The contrast  enhancement pattern is normal.      Impression         [No acute process Specifically there  is no acute  cortical infarct, mass or evidence of hemorrhage.       Heron Nay, MD   08/19/2015 8:15 AM             Elspeth Cho  [facility]  Available on Epic chat

## 2020-09-14 NOTE — ED Provider Notes (Signed)
EMERGENCY DEPARTMENT HISTORY AND PHYSICAL EXAM     None        Date: 09/14/2020  Patient Name: [name] Cook    History of Presenting Illness     Chief Complaint   Patient presents with    Chest Pain    Facial Droop       History Provided By: patient      History: [name] Cook is a 45 y.o. female.with hx of stroke presents c/o right side facial droop and rle "heaviness" which she noticed when she woke up at around 0700. She also states she woke up and had chest pain. Took her aspirin today. States she had a stroke years ago and had to learn to walk again, but has completely recovered. No reported fever or cough. No reported nausea or vomiting. + hx of cva, htn, seizure.    PCP: Letitia Libra, MD      Current Facility-Administered Medications   Medication Dose Route Frequency Provider Last Rate Last Admin    aspirin suppository 300 mg  300 mg Rectal Once Elliot Cousin, MD         Current Outpatient Medications   Medication Sig Dispense Refill    albuterol sulfate HFA (PROVENTIL) 108 (90 Base) MCG/ACT inhaler Inhale 2 puffs into the lungs every 4 (four) hours as needed for Wheezing or Shortness of Breath 1 each 0    amLODIPine (NORVASC) 5 MG tablet Take 1 tablet (5 mg total) by mouth daily 30 tablet 0    aspirin 81 MG chewable tablet Chew 1 tablet (81 mg total) by mouth daily 30 tablet 0    atorvastatin (LIPITOR) 40 MG tablet Take by mouth.      LevETIRAcetam (KEPPRA PO) Take 500 mg by mouth 2 (two) times daily.         lidocaine (LIDODERM) 5 % Place 1 patch onto the skin every 24 hours Remove & Discard patch within 12 hours or as directed by MD 30 each 0    lisinopril (PRINIVIL,ZESTRIL) 40 MG tablet Take 40 mg by mouth daily.      multivitamin (MULTIVITAMIN) Tab Take 1 tablet by mouth daily  0    pantoprazole (PROTONIX) 40 MG tablet Take 1 tablet (40 mg total) by mouth daily 30 tablet 0    sodium hypochlorite (H-CHLOR 12) 0.125 % Solution Irrigate with as directed daily 473 mL 0       Past History     Past  Medical History:  Past Medical History:   Diagnosis Date    Hypercholesteremia     Hypertension     Seizures     Stroke     2015 and 2017, right side    TIA (transient ischemic attack) 2017       Past Surgical History:  Past Surgical History:   Procedure Laterality Date    APPENDECTOMY (OPEN)      CHOLECYSTECTOMY      EGD, BIOPSY N/A 04/16/2017    Procedure: EGD, BIOPSY;  Surgeon: Pershing Proud, MD;  Location: [facility];  Service: Gastroenterology;  Laterality: N/A;    HYSTERECTOMY         Family History:  Family History   Problem Relation Age of Onset    Myocardial Infarction Father 25    Deep vein thrombosis Father     No known problems Mother        Social History:  Social History     Tobacco Use    Smoking status:  Never    Smokeless tobacco: Never   Vaping Use    Vaping Use: Never used   Substance Use Topics    Alcohol use: No    Drug use: No       Allergies:  Allergies   Allergen Reactions    Contrast [Iodinated Diagnostic Agents] Anaphylaxis     Patient woke up in ICU after having CT with contrast, last remembers being in CT    Fioricet [Butalbital-Apap-Caffeine] Hives    Bentyl [Dicyclomine] Edema    Fentanyl     Iodine     Motrin [Ibuprofen] Swelling    Percocet [Oxycodone-Acetaminophen]     Shellfish-Derived Products     Toradol [Ketorolac Tromethamine]     Tramadol     Tylenol [Acetaminophen] Hives       Review of Systems     Review of Systems   Respiratory:  Negative for shortness of breath.    Cardiovascular:  Positive for chest pain.   Neurological:         Facial droop  Right leg weakness   All other systems reviewed and are negative.    Physical Exam   BP 186/86   Pulse 66   Temp 98.6 F (37 C) (Oral)   Resp 18   Ht 5\' 10"  (1.778 m)   Wt 148.3 kg   LMP 10/15/2018 (Exact Date)   SpO2 97%   BMI 46.92 kg/m     Physical Exam  Constitutional:       General: She is not in acute distress.     Appearance: She is not diaphoretic.   HENT:      Head: Normocephalic and atraumatic.   Eyes:       General: No scleral icterus.        Right eye: No discharge.         Left eye: No discharge.      Conjunctiva/sclera: Conjunctivae normal.   Cardiovascular:      Rate and Rhythm: Normal rate and regular rhythm.   Pulmonary:      Effort: Pulmonary effort is normal. No respiratory distress.      Breath sounds: Normal breath sounds. No wheezing or rales.   Abdominal:      General: Bowel sounds are normal. There is no distension.      Palpations: Abdomen is soft.      Tenderness: There is no abdominal tenderness. There is no guarding or rebound.   Skin:     General: Skin is warm and dry.   Neurological:      Mental Status: She is alert and oriented to person, place, and time.      Comments: Right facial droop, forehead is spared    Rle weakness  Clear and fluent speech  Nl strength b/l ue  Diminished sensation rle   Psychiatric:         Mood and Affect: Mood and affect normal.           Diagnostic Study Results     Labs -     Results       Procedure Component Value Units Date/Time    COVID-19 (SARS-COV-2) [facility] Rapid) [[id number]] Collected: 09/14/20 1802    Specimen: Nasopharyngeal Swab from Nasopharynx Updated: 09/14/20 1832     Purpose of COVID testing Screening     SARS-CoV-2 Specimen Source Nasopharyngeal     SARS CoV-2 Negative    Narrative:      o Collect and clearly label specimen type:  o Upper respiratory specimen: One Nasopharyngeal Dry Swab NO  Transport Media.  o Hand deliver to laboratory ASAP  Indication for testing->Extended care facility admission to  semi private room  Screening  Rescheduled by [phone] at 09/14/2020 17:59 Reason: .  Rescheduled by [id number] at 09/14/2020 18:00 Reason: Marland Kitchen    Glucose Whole Blood - POCT [[id number]] Collected: 09/14/20 1724     Updated: 09/14/20 1726     Whole Blood Glucose POCT 94 mg/dL     Manual Differential [[id number]]  (Abnormal) Collected: 09/14/20 1620     Updated: 09/14/20 1715     Segmented Neutrophils 62 %      Band Neutrophils 0 %      Lymphocytes Manual 32 %       Monocytes Manual 2 %      Eosinophils Manual 2 %      Basophils Manual 2 %      Neutrophils Absolute Manual 5.63 x10 3/uL      Band Neutrophils Absolute 0.00 x10 3/uL      Lymphocytes Absolute Manual 2.91 x10 3/uL      Monocytes Absolute 0.18 x10 3/uL      Eosinophils Absolute Manual 0.18 x10 3/uL      Basophils Absolute Manual 0.18 x10 3/uL     Cell MorpHology [[id number]]  (Abnormal) Collected: 09/14/20 1620     Updated: 09/14/20 1715     Cell Morphology Abnormal     Platelet Estimate Normal     Ovalocytes Present    CBC and differential [[id number]]  (Abnormal) Collected: 09/14/20 1620    Specimen: Blood Updated: 09/14/20 1715     WBC 9.08 x10 3/uL      Hgb 12.5 g/dL      Hematocrit 87.5 %      Platelets 157 x10 3/uL      RBC 4.51 x10 6/uL      MCV 87.1 fL      MCH 27.7 pg      MCHC 31.8 g/dL      RDW 14 %      MPV 12.6 fL      Nucleated RBC 0.0 /100 WBC      Absolute NRBC 0.00 x10 3/uL     Beta HCG Quantitative [[id number]] Collected: 09/14/20 1620     Updated: 09/14/20 1702     hCG, Quant. <2.4 mIU/mL     Comprehensive metabolic panel [[id number]]  (Abnormal) Collected: 09/14/20 1620    Specimen: Blood Updated: 09/14/20 1702     Glucose 115 mg/dL      BUN 16.0 mg/dL      Creatinine 1.0 mg/dL      Sodium 109 mEq/L      Potassium 3.7 mEq/L      Chloride 109 mEq/L      CO2 26 mEq/L      Calcium 9.6 mg/dL      Protein, Total 8.6 g/dL      Albumin 4.1 g/dL      AST (SGOT) 20 U/L      ALT 25 U/L      Alkaline Phosphatase 136 U/L      Bilirubin, Total 0.9 mg/dL      Globulin 4.5 g/dL      Albumin/Globulin Ratio 0.9     Anion Gap 6.0    GFR [[id number]] Collected: 09/14/20 1620     Updated: 09/14/20 1702     EGFR >60.0    Troponin I [[id number]] Collected: 09/14/20 1620    Specimen: Blood Updated:  09/14/20 1657     Troponin I <0.01 ng/mL     Prothrombin time/INR [[id number]] Collected: 09/14/20 1620    Specimen: Blood Updated: 09/14/20 1645     PT 12.6 sec      PT INR 1.1            Radiologic Studies -   Radiology Results  (24 Hour)       Procedure Component Value Units Date/Time    MRI Brain WO Contrast [[id number]] Collected: 09/14/20 2100    Order Status: Completed Updated: 09/14/20 2107    Narrative:      Clinical History:    left facial droop, RLE weakness    Technique:    MRI BRAIN WO CONTRAST MRI of the brain was performed without contrast  and the following sequences were obtained: Sagittal T1, axial T1, axial  T2, axial FLAIR, axial and coronal diffusion and ADC, axial gradient  echo.    Comparison:    MRI of the brain dated 10/30/2018    Findings:    The ventricles and sulci are normal. There is no intra or extra-axial  fluid collection, midline shift or mass effect. There are 2 punctate  foci of T2/FLAIR hyperintensity within the left centrum semiovale,  unchanged from the prior study. There are no areas of restricted  diffusion. The basal cisterns are patent.    The regions of the sella, pineal gland, and craniocervical junction are  unremarkable. Flow-voids are identified within the vessels of the skull  base. The globes and orbits are unremarkable. The paranasal sinuses and  mastoid air cells are clear. The scalp and calvarium are unremarkable.      Impression:          No acute intracranial abnormality.    2 punctate foci of T2/FLAIR hyperintensity within the left centrum  semiovale are unchanged from the prior study, and can be seen in the  setting of chronic microvascular ischemic change or migraine.    Alric Seton MD, MD   09/14/2020 9:05 PM    CT Head WO Contrast [[id number]] Collected: 09/14/20 1635    Order Status: Completed Updated: 09/14/20 1642    Narrative:      CT HEAD WO CONTRAST: 09/14/2020 4:28 PM    CLINICAL INFORMATION:  Stroke, follow up.    COMPARISON:  Head CT dated 01/28/2019.    TECHNIQUE:  Axial CT images of the brain without contrast. Multiplanar  reformations performed.    Note: Note that CT scanning at this site  utilizes multiple dose  reduction techniques including automatic exposure control,  adjustment of  the MAS and/or KVP according to patient's size and use of iterative  reconstruction technique.    FINDINGS:  Brain parenchyma and CSF-containing spaces demonstrate normal density  and configuration. Gray-white differentiation is preserved. There is no  hemorrhage, herniation, mass effect, or hydrocephalus. Calvarium appears  intact.      Impression:        No CT evidence of acute intracranial pathology.    [name], MD   09/14/2020 4:40 PM        .      Medical Decision Making   I am the first provider for this patient.    I reviewed the vital signs, available nursing notes, past medical history, past surgical history, family history and social history.    Vital Signs-Reviewed the patient's vital signs.   Patient Vitals for the past 12 hrs:   BP Temp Pulse Resp  09/14/20 2115 186/86 -- 66 18   09/14/20 1955 (!) 215/96 -- 74 --   09/14/20 1836 -- -- 61 16   09/14/20 1652 (!) 190/101 -- 72 16   09/14/20 1618 (!) 181/124 98.6 F (37 C) 78 20       Pulse Oximetry Analysis - Normal 97% on ra    Cardiac Monitor:   Rate: nl   Rhythm:  Normal Sinus Rhythm     EKG:  Interpreted by the EP.   Time Interpreted:    Rate: 66   Rhythm: Normal Sinus Rhythm    Interpretation:nsr, no acute ischemic changes        ED Course: Seen by Dr.Sumner in ED. No TPA due to wake up symptoms, LKW 11pm last night. No LVO    Provider Notes: d/w Dr. Milagros Evener, will admit    Procedures:  Heart Score      Flowsheet Row Value   History 1   EKG 1   Risk Factors 2   Total (with age) 4   Onset of pain (time of START of last episode of chest pain)? --          NIH Stroke Score      Flowsheet Row Most Recent Value   Patient's calculated Stroke Score: 6 filed at 09/14/2020 1738          CRITICAL CARE: The high probability of sudden, clinically significant deterioration in the patient's condition required the highest level of my preparedness to intervene urgently.    The services I provided to this patient were to treat and/or prevent clinically  significant deterioration that could result in: death.  Services included the following: chart data review, reviewing nursing notes and/or old charts, documentation time, consultant collaboration regarding findings and treatment options, medication orders and management, direct patient care, re-evaluations, vital sign assessments and ordering, interpreting and reviewing diagnostic studies/lab tests.    Aggregate critical care time was 35 minutes, which includes only time during which I was engaged in work directly related to the patient's care, as described above, whether at the bedside or elsewhere in the Emergency Department.  It did not include time spent performing other reported procedures or the services of residents, students, nurses or physician assistants.      Diagnosis     Clinical Impression:   1. Facial droop    2. Chest pain    3. Right leg weakness        _______________________________       Elliot Cousin, MD  09/14/20 2152

## 2020-09-14 NOTE — ED Notes (Signed)
[place] EMERGENCY DEPARTMENT  ED NURSING NOTE FOR THE RECEIVING INPATIENT NURSE   ED NURSE [place] [phone]   ED CHARGE RN [phone]   ADMISSION INFORMATION   [name] Cook is a 45 y.o. female admitted with a diagnosis of:    1. Chest pain         Isolation: None   Allergies: Contrast [iodinated diagnostic agents], Fioricet [butalbital-apap-caffeine], Bentyl [dicyclomine], Fentanyl, Iodine, Motrin [ibuprofen], Percocet [oxycodone-acetaminophen], Shellfish-derived products, Toradol [ketorolac tromethamine], Tramadol, and Tylenol [acetaminophen]   Holding Orders confirmed? Yes   Belongings Documented? Yes   Home medications sent to pharmacy confirmed? No   NURSING CARE   Patient Comes From:   Mental Status: Home Independent  alert and oriented   ADL: Independent with all ADLs   Ambulation: no difficulty   Pertinent Information  and Safety Concerns: R side facial droop , neg CT and MRI     COVID Test sent to lab? Yes   VITAL SIGNS   Time BP Temp Pulse Resp SpO2   2130 186/86 98.6 66 18 97%   CT / NIH   CT Head ordered on this patient?  Yes   NIH/Dysphagia assessment done prior to admission? Yes   PERSONAL PROTECTIVE EQUIPMENT   Gloves   LAB RESULTS   Labs Reviewed   CBC AND DIFFERENTIAL - Abnormal; Notable for the following components:       Result Value    MPV 12.6 (*)     All other components within normal limits   COMPREHENSIVE METABOLIC PANEL - Abnormal; Notable for the following components:    Glucose 115 (*)     Protein, Total 8.6 (*)     Alkaline Phosphatase 136 (*)     Globulin 4.5 (*)     All other components within normal limits   MANUAL DIFFERENTIAL - Abnormal; Notable for the following components:    Monocytes Absolute 0.18 (*)     Basophils Absolute Manual 0.18 (*)     All other components within normal limits   CELL MORPHOLOGY - Abnormal; Notable for the following components:    Cell Morphology Abnormal (*)     Ovalocytes Present (*)     All other components within normal limits   COVID-19  (SARS-COV-2)    Narrative:     o Collect and clearly label specimen type:  o Upper respiratory specimen: One Nasopharyngeal Dry Swab NO  Transport Media.  o Hand deliver to laboratory ASAP  Indication for testing->Extended care facility admission to  semi private room  Screening  Rescheduled by [id number] at 09/14/2020 17:59 Reason: .  Rescheduled by [id number] at 09/14/2020 18:00 Reason: .   TROPONIN I   PT/INR   GFR   HCG QUANTITATIVE   GLUCOSE WHOLE BLOOD - POCT          Ticket to Ride Printed: Yes

## 2020-09-14 NOTE — ED Notes (Signed)
Patient transported to MRI via stretcher at this time. Clothing left at bedside and patient took purse and cell phone with her at bedside.

## 2020-09-14 NOTE — ED Notes (Signed)
Bed: BL16  Expected date:   Expected time:   Means of arrival:   Comments:  CN HOLD

## 2020-09-14 NOTE — ED Triage Notes (Signed)
[facility] EMERGENCY DEPARTMENT MEDICAL SCREENING EXAM NOTE        Patient Name: [name] Cook    Chief Complaint:   Chief Complaint   Patient presents with    Chest Pain    Facial Droop       HPI: [name] Cook is a 45 y.o. female, hx CVA (no residual deficits), HTN, who has had a rapid medical screening evaluation initiated by myself for the chief complaint of chest pain, right sided facial droop.  Patient reports she went to bed well, then had CP that woke her up from sleep.  She got up to use the bathroom when she noted right sided facial droop in the mirror.  324mg  ASA daily.      ROS: Please see HPI.     Medical/Surgical/Social history: as per HPI    Vitals: BP (!) 181/124   Pulse 78   Temp 98.6 F (37 C) (Oral)   Resp 20   Ht 5\' 10"  (1.778 m)   Wt 148.3 kg   LMP 10/15/2018 (Exact Date)   SpO2 99%   BMI 46.92 kg/m     Pertinent brief exam:   Constitutional: No acute distress.Well-nourished.  Cardiovascular: Normal heart rate.   Pulmonary: Normal effort.  No respiratory distress. No stridor.  Skin: Normal skin color. No diaphoresis.  Psych: Normal affect.     Additional pertinent physical exam findings: right sided facial droop, sparing forehead,  decreased sensation V2-3.    Preliminary orders: CT head, EKG, CMP, CBC, trop, coags    Symptom based diagnosis: facial droop, chest pain      Patient advised to remain in the ED until further evaluation can be performed. Patient instructed to notify staff of any changes in condition while waiting.    This assessment is an initial evaluation to expedite care.

## 2020-09-14 NOTE — ED Notes (Signed)
Pt states continued chest pain and asking for medication. Offered nitro tab, pt refused stating "last time I had that my head hurt so bad a I had a seziure." Offered GI cocktail, pt also refused and states "I dont want that ill just deal with it."

## 2020-09-14 NOTE — H&P (Signed)
ADMISSION HISTORY AND PHYSICAL EXAM        Date Time: 09/14/20 10:31 PM  Patient Name: [name] Cook  Attending Physician: Carmelina Paddock, MD  Primary Care Physician: Letitia Libra, MD    CC:   Chief Complaint   Patient presents with    Chest Pain    Facial Droop            ASSESSMENT & PLAN     Chest pain  Hypertensive urgency  Left facial weakness/right lower extremity weakness, r/o TIA/stroke  History of CVA/TIA  History of seizures  History of migraine headache  Hypertension  Hyperlipidemia  History of asthma    Admit to IMCU  Symptoms of chest pain/TIA could be secondary to uncontrolled hypertension  Restart home blood pressure medications.  Add hydralazine as needed  Troponin panel, lipid panel, hemoglobin A1c  TSH  PPI/GI cocktail  Cardiology consult in the morning  CT head and MRI brain unremarkable.  Neurology consulted  Aspirin, statin  PT/OT  Resume Keppra          Nutrition: regular   DVT/VTE Prophylaxis Lovenox  PT/OT eval     HPI       [name] Cook is a 45 y.o. female with a PMHx of hypertension, dyslipidemia, seizures, TIA/stroke who presented with chest pain and left facial weakness/right lower extremity weakness/right-sided sensory deficits  Patient reported she woke up around 7:00 in the morning with symptoms of chest pressure/heaviness nonradiating.  She also noted left-sided facial droop and right lower extremity weakness along with paresthesia in the face, arm and leg.  She went to ER for evaluation.  CT head showed no acute process.  Patient was not a tPA candidate as LKW>4.5 hours prior to presentation to ER  Blood pressure noted to be 190/101 and up to 215/96  Patient was seen by neurology.  Recommended MRI of the brain which was unremarkable  Troponin negative.  EKG no acute ischemic changes  Patient denies headache, trouble speaking, double vision, vertigo, nausea, vomiting, shortness of breath          Past Medical History:   Diagnosis Date    Hypercholesteremia     Hypertension      Seizures     Stroke     2015 and 2017, right side    TIA (transient ischemic attack) 2017       Past Surgical History:   Procedure Laterality Date    APPENDECTOMY (OPEN)      CHOLECYSTECTOMY      EGD, BIOPSY N/A 04/16/2017    Procedure: EGD, BIOPSY;  Surgeon: Pershing Proud, MD;  Location: [facility];  Service: Gastroenterology;  Laterality: N/A;    HYSTERECTOMY         Prior to Admission medications    Medication Sig Start Date End Date Taking? Authorizing Provider   albuterol sulfate HFA (PROVENTIL) 108 (90 Base) MCG/ACT inhaler Inhale 2 puffs into the lungs every 4 (four) hours as needed for Wheezing or Shortness of Breath 03/13/20   Lorenda Cahill, PA   amLODIPine (NORVASC) 5 MG tablet Take 1 tablet (5 mg total) by mouth daily 10/18/19   Tirrell, Fransico Him, MD   aspirin 81 MG chewable tablet Chew 1 tablet (81 mg total) by mouth daily 09/19/17   Doy Hutching D, MD   atorvastatin (LIPITOR) 40 MG tablet Take by mouth.    [provider]   LevETIRAcetam (KEPPRA PO) Take 500 mg by mouth 2 (two) times daily.  [provider]   lidocaine (LIDODERM) 5 % Place 1 patch onto the skin every 24 hours Remove & Discard patch within 12 hours or as directed by MD 10/18/19   Doyle Askew, Fransico Him, MD   lisinopril (PRINIVIL,ZESTRIL) 40 MG tablet Take 40 mg by mouth daily.    [provider]   multivitamin (MULTIVITAMIN) Tab Take 1 tablet by mouth daily 10/17/19   Tirrell, Fransico Him, MD   pantoprazole (PROTONIX) 40 MG tablet Take 1 tablet (40 mg total) by mouth daily 10/30/18   Karlton Lemon, MD   sodium hypochlorite (H-CHLOR 12) 0.125 % Solution Irrigate with as directed daily 10/17/19   Tirrell, Fransico Him, MD       Allergies   Allergen Reactions    Contrast [Iodinated Diagnostic Agents] Anaphylaxis     Patient woke up in ICU after having CT with contrast, last remembers being in CT    Fioricet [Butalbital-Apap-Caffeine] Hives    Bentyl [Dicyclomine] Edema    Fentanyl     Iodine     Motrin [Ibuprofen] Swelling     Percocet [Oxycodone-Acetaminophen]     Shellfish-Derived Products     Toradol [Ketorolac Tromethamine]     Tramadol     Tylenol [Acetaminophen] Hives       CODE STATUS:  FULL CODE    PRIMARY CARE MD: Letitia Libra, MD    Family History   Problem Relation Age of Onset    Myocardial Infarction Father 40    Deep vein thrombosis Father     No known problems Mother        Social History     Tobacco Use    Smoking status: Never    Smokeless tobacco: Never   Vaping Use    Vaping Use: Never used   Substance Use Topics    Alcohol use: No    Drug use: No       REVIEW OF SYSTEMS   Review of Systems   General ROS: negative for - chills or fever  ENT ROS: negative for - headaches, tinnitus, vertigo, visual changes or vocal changes  Hematological and Lymphatic ROS: negative for - bruising, night sweats or swollen lymph nodes  Endocrine ROS: negative for - hot flashes, temperature intolerance or unexpected weight changes  Respiratory ROS: negative for - cough, hemoptysis, shortness of breath or wheezing  Cardiovascular ROS: negative for -  dyspnea on exertion, edema, irregular heartbeat, loss of consciousness, orthopnea or paroxysmal nocturnal dyspnea  Gastrointestinal ROS: no abdominal pain, change in bowel habits, or black or bloody stools  Genito-Urinary ROS: no dysuria, trouble voiding, or hematuria  Musculoskeletal ROS: negative for - gait disturbance or joint pain  Neurological ROS: negative for - seizures, speech problems, visual changes   Dermatological ROS: negative for pruritus and rash  All ROS completed and otherwise negative.    PHYSICAL EXAM     Vital Signs (most recent): BP 186/86   Pulse 66   Temp 98.6 F (37 C) (Oral)   Resp 18   Ht 1.778 m (5\' 10" )   Wt 148.3 kg (327 lb)   LMP 10/15/2018 (Exact Date)   SpO2 97%   BMI 46.92 kg/m   Constitutional: No distress. Patient speaks freely in full sentences.   HEENT: NC/AT, PERRL, no scleral icterus or conjunctival pallor,   Neck: trachea midline, supple,  no cervical or supraclavicular lymphadenopathy  Cardiovascular: Normal Rhythm and Rate, normal S1 S2, no murmurs, , no JVD  Respiratory: Normal work  of breathing. Clear to auscultation bilaterally.  Gastrointestinal: +BS, non-distended, soft, non-tender, no rebound or guarding  Genitourinary: no suprapubic or costovertebral angle tenderness  Musculoskeletal: ROM and motor strength grossly normal. No edema  Skin exam:  Pink. No visible rash or ulcer  Neurologic: Alert and oriented, CN 2-12 grossly intact. no gross motor or sensory deficits  Capillary refill:  Normal    LABS & IMAGING   Labs reviewed   Recent Results (from the past 24 hour(s))   CBC and differential    Collection Time: 09/14/20  4:20 PM   Result Value Ref Range    WBC 9.08 3.10 - 9.50 x10 3/uL    Hgb 12.5 11.4 - 14.8 g/dL    Hematocrit 16.1 09.6 - 43.7 %    Platelets 157 142 - 346 x10 3/uL    RBC 4.51 3.90 - 5.10 x10 6/uL    MCV 87.1 78.0 - 96.0 fL    MCH 27.7 25.1 - 33.5 pg    MCHC 31.8 31.5 - 35.8 g/dL    RDW 14 11 - 15 %    MPV 12.6 (H) 8.9 - 12.5 fL    Nucleated RBC 0.0 0.0 - 0.0 /100 WBC    Absolute NRBC 0.00 0.00 - 0.00 x10 3/uL   Comprehensive metabolic panel    Collection Time: 09/14/20  4:20 PM   Result Value Ref Range    Glucose 115 (H) 70 - 100 mg/dL    BUN 04.5 7.0 - 40.9 mg/dL    Creatinine 1.0 0.6 - 1.0 mg/dL    Sodium 811 914 - 782 mEq/L    Potassium 3.7 3.5 - 5.1 mEq/L    Chloride 109 100 - 111 mEq/L    CO2 26 22 - 29 mEq/L    Calcium 9.6 8.5 - 10.5 mg/dL    Protein, Total 8.6 (H) 6.0 - 8.3 g/dL    Albumin 4.1 3.5 - 5.0 g/dL    AST (SGOT) 20 5 - 34 U/L    ALT 25 0 - 55 U/L    Alkaline Phosphatase 136 (H) 37 - 117 U/L    Bilirubin, Total 0.9 0.2 - 1.2 mg/dL    Globulin 4.5 (H) 2.0 - 3.6 g/dL    Albumin/Globulin Ratio 0.9 0.9 - 2.2    Anion Gap 6.0 5.0 - 15.0   Troponin I    Collection Time: 09/14/20  4:20 PM   Result Value Ref Range    Troponin I <0.01 0.00 - 0.05 ng/mL   Prothrombin time/INR    Collection Time: 09/14/20  4:20 PM    Result Value Ref Range    PT 12.6 10.1 - 12.9 sec    PT INR 1.1 0.9 - 1.1   GFR    Collection Time: 09/14/20  4:20 PM   Result Value Ref Range    EGFR >60.0    Beta HCG Quantitative    Collection Time: 09/14/20  4:20 PM   Result Value Ref Range    hCG, Quant. <2.4 See Below mIU/mL   Manual Differential    Collection Time: 09/14/20  4:20 PM   Result Value Ref Range    Segmented Neutrophils 62 None %    Band Neutrophils 0 None %    Lymphocytes Manual 32 None %    Monocytes Manual 2 None %    Eosinophils Manual 2 None %    Basophils Manual 2 None %    Neutrophils Absolute Manual 5.63 1.10 - 6.33 x10 3/uL  Band Neutrophils Absolute 0.00 0.00 - 1.00 x10 3/uL    Lymphocytes Absolute Manual 2.91 0.42 - 3.22 x10 3/uL    Monocytes Absolute 0.18 (L) 0.21 - 0.85 x10 3/uL    Eosinophils Absolute Manual 0.18 0.00 - 0.44 x10 3/uL    Basophils Absolute Manual 0.18 (H) 0.00 - 0.08 x10 3/uL   Cell MorpHology    Collection Time: 09/14/20  4:20 PM   Result Value Ref Range    Cell Morphology Abnormal (A)     Platelet Estimate Normal     Ovalocytes Present (A)    Glucose Whole Blood - POCT    Collection Time: 09/14/20  5:24 PM   Result Value Ref Range    Whole Blood Glucose POCT 94 70 - 100 mg/dL   [id number] (SARS-COV-2) ([place] [facility])    Collection Time: 09/14/20  6:02 PM    Specimen: Nasopharynx; Nasopharyngeal Swab   Result Value Ref Range    Purpose of COVID testing Screening     SARS-CoV-2 Specimen Source Nasopharyngeal     SARS CoV-2 Negative          IMAGING:  Radiological Procedure independently  reviewed.  CT Head WO Contrast    Result Date: 09/14/2020  No CT evidence of acute intracranial pathology. [name], MD  09/14/2020 4:40 PM    MRI Brain WO Contrast    Result Date: 09/14/2020  No acute intracranial abnormality. 2 punctate foci of T2/FLAIR hyperintensity within the left centrum semiovale are unchanged from the prior study, and can be seen in the setting of chronic microvascular ischemic change or migraine. Alric Seton MD,  MD  09/14/2020 9:05 PM       Markers:  Recent Labs   Lab 09/14/20  1620   Troponin I <0.01             Signed,  Carmelina Paddock, MD,

## 2020-09-14 NOTE — ED Notes (Signed)
Ambulatory with even and steady gait to and from restroom

## 2020-09-14 NOTE — ED Triage Notes (Signed)
Pt. BIB self c/o CP, R sided facial droop.  LKW = 1000 this AM.  Pt. States she awoke with CP at 1000, her face was fine and then she then noticed her face began to droop. Pt. Denies, SOB, V/D.  Pt. Has hx of stroke.  Pt. Is A+Ox4, moving all extremities with no other medical complaints.    BP (!) 181/124   Pulse 78   Temp 98.6 F (37 C) (Oral)   Resp 20   Ht 5\' 10"  (1.778 m)   Wt 148.3 kg   LMP 10/15/2018 (Exact Date)   SpO2 99%   BMI 46.92 kg/m

## 2020-09-15 DIAGNOSIS — R072 Precordial pain: Secondary | ICD-10-CM

## 2020-09-15 DIAGNOSIS — I160 Hypertensive urgency: Secondary | ICD-10-CM

## 2020-09-15 DIAGNOSIS — R531 Weakness: Secondary | ICD-10-CM

## 2020-09-15 LAB — BASIC METABOLIC PANEL
Anion Gap: 8 (ref 5.0–15.0)
BUN: 13 mg/dL (ref 7.0–19.0)
CO2: 24 mEq/L (ref 22–29)
Calcium: 9 mg/dL (ref 8.5–10.5)
Chloride: 109 mEq/L (ref 100–111)
Creatinine: 0.9 mg/dL (ref 0.6–1.0)
Glucose: 110 mg/dL — ABNORMAL HIGH (ref 70–100)
Potassium: 3.4 mEq/L — ABNORMAL LOW (ref 3.5–5.1)
Sodium: 141 mEq/L (ref 136–145)

## 2020-09-15 LAB — LIPID PANEL
Cholesterol / HDL Ratio: 5.4 Index
Cholesterol: 177 mg/dL (ref 0–199)
HDL: 33 mg/dL — ABNORMAL LOW (ref 40–9999)
LDL Calculated: 122 mg/dL — ABNORMAL HIGH (ref 0–99)
Triglycerides: 108 mg/dL (ref 34–149)
VLDL Calculated: 22 mg/dL (ref 10–40)

## 2020-09-15 LAB — CBC
Absolute NRBC: 0 10*3/uL (ref 0.00–0.00)
Hematocrit: 43.5 % (ref 34.7–43.7)
Hgb: 13.8 g/dL (ref 11.4–14.8)
MCH: 27.7 pg (ref 25.1–33.5)
MCHC: 31.7 g/dL (ref 31.5–35.8)
MCV: 87.2 fL (ref 78.0–96.0)
MPV: 14.2 fL — ABNORMAL HIGH (ref 8.9–12.5)
Nucleated RBC: 0 /100 WBC (ref 0.0–0.0)
Platelets: 143 10*3/uL (ref 142–346)
RBC: 4.99 10*6/uL (ref 3.90–5.10)
RDW: 15 % (ref 11–15)
WBC: 9.74 10*3/uL — ABNORMAL HIGH (ref 3.10–9.50)

## 2020-09-15 LAB — HEMOGLOBIN A1C
Average Estimated Glucose: 128.4 mg/dL
Hemoglobin A1C: 6.1 % — ABNORMAL HIGH (ref 4.6–5.9)

## 2020-09-15 LAB — TSH: TSH: 3.25 u[IU]/mL (ref 0.35–4.94)

## 2020-09-15 LAB — TROPONIN I: Troponin I: <0.01 ng/mL (ref 0.00–0.05)

## 2020-09-15 LAB — GFR: EGFR: >60

## 2020-09-15 MED ORDER — AMLODIPINE BESYLATE 5 MG PO TABS
5.0000 mg | ORAL_TABLET | Freq: Every day | ORAL | 0 refills | Status: DC
Start: 2020-09-15 — End: 2022-07-01

## 2020-09-15 MED ORDER — MORPHINE SULFATE 4 MG/ML IJ/IV SOLN (WRAP)
4.0000 mg | Status: DC | PRN
Start: 2020-09-15 — End: 2020-09-16
  Administered 2020-09-15 – 2020-09-16 (×8): 4 mg via INTRAVENOUS
  Filled 2020-09-15 (×8): qty 1

## 2020-09-15 MED ORDER — LISINOPRIL 40 MG PO TABS
40.0000 mg | ORAL_TABLET | Freq: Every day | ORAL | 0 refills | Status: DC
Start: 2020-09-15 — End: 2021-07-29

## 2020-09-15 MED ORDER — POTASSIUM CHLORIDE CRYS ER 20 MEQ PO TBCR
40.0000 meq | EXTENDED_RELEASE_TABLET | Freq: Once | ORAL | Status: AC
Start: 2020-09-15 — End: 2020-09-15
  Administered 2020-09-15: 14:00:00 40 meq via ORAL
  Filled 2020-09-15: qty 2

## 2020-09-15 MED ORDER — MECLIZINE HCL 12.5 MG PO TABS
25.0000 mg | ORAL_TABLET | Freq: Three times a day (TID) | ORAL | Status: DC | PRN
Start: 2020-09-15 — End: 2020-09-16

## 2020-09-15 MED ORDER — HYDRALAZINE HCL 25 MG PO TABS
25.0000 mg | ORAL_TABLET | Freq: Three times a day (TID) | ORAL | Status: DC | PRN
Start: 2020-09-15 — End: 2020-09-18
  Administered 2020-09-16: 25 mg via ORAL
  Filled 2020-09-15: qty 1

## 2020-09-15 MED ORDER — ATORVASTATIN CALCIUM 40 MG PO TABS
40.0000 mg | ORAL_TABLET | Freq: Every day | ORAL | 0 refills | Status: DC
Start: 2020-09-15 — End: 2022-10-30

## 2020-09-15 NOTE — Respiratory Progress Note (Signed)
Respiratory Therapy Patient Assessment    A2504/A2504-A  09/15/20 3:25 AM  RT: Melvenia Needles, RT      Admitting DX: Facial droop [R29.810]  Right leg weakness [R29.898]  Chest pain [R07.9]    Pulmonary History: Asthma    Other Pulm Hx:      Therapy ordered:     IP Meds - Nasal and Inhaled (From admission, onward)      Start     Stop Status Route Frequency Ordered    09/14/20 2228  albuterol (PROVENTIL) (2.5 MG/3ML) 0.083% nebulizer solution 5 mg         -- Verified NEBULIZATION RT - Every 6 hours as needed 09/14/20 2228               PT able to take deep breath? Yes               Airway: Natural   Mobility: Ambulatory with or without assistance  CXR: normal chest Xray    Cough Effort: Strong            Can clear secretions with cough? Yes  Can clear secretions with suctioning? N/A     Social History     Tobacco Use   Smoking Status Never   Smokeless Tobacco Never        Breath Sounds:  Bilateral Breath Sounds: Clear  R Breath Sounds: Clear  L Breath Sounds: Clear    Heart Rate: (!) 103 Resp Rate: 17  SpO2: 100 % O2 Device: None (Room air)          Home regimen:  Home Treatments: albuterol inhaler PRN  Home Oxygen: na   Home CPAP/BiLevel: na    Criteria for therapy:  Secretion Clearance: None indicated  Lung Expansion: None indicated  Medications: Home regimen (pt at baseline therapy)    Recommendations/Interventions:  Recommendations/Interventions: Bronchodilators, Continue home regimen     Expected Outcomes:        Meds: Return to baseline home regimen      Re-Evaluation:  Follow-up Date:   Improving with Therapy: N/A    Plan of Care Recommendations:  Plan of Care: continue albuterol neb tx Q6PRN for wheezing

## 2020-09-15 NOTE — Plan of Care (Signed)
Patient admitted to 2504A from ED to role out stroke. Patient is A/O x4. On Full Code. Saturating fine on room air and denies SOB. NSR on the telemetry monitor. No S/Sx of SOB nor CP noted. Fall prevention tool was reviewed and re-inforced. Plan of care was discussed with the patient and clinical technician on bedside. Patient's whiteboard filled up and completed. Education was conducted regarding the patient's rights and what to expect during hospitalization. CHG bath done. Gown changed. Plan to  monitor vital signs / labs. Patient agreed to the current plan and all questions & concerns were addressed.     By: Tonie Griffith T. [name] Jacobs, BSN, RN, PCCN   Problem: Safety  Goal: Patient will be free from injury during hospitalization  09/15/2020 0155 by Benjaman Kindler', RN  Outcome: Progressing  Flowsheets (Taken 09/15/2020 0151)  Patient will be free from injury during hospitalization:   Assess patient's risk for falls and implement fall prevention plan of care per policy   Provide and maintain safe environment   Use appropriate transfer methods   Ensure appropriate safety devices are available at the bedside   Hourly rounding   Include patient/ family/ care giver in decisions related to safety   Provide alternative method of communication if needed (communication boards, writing)   Assess for patients risk for elopement and implement Elopement Risk Plan per policy  09/15/2020 0151 by Benjaman Kindler', RN  Outcome: Progressing  Flowsheets (Taken 09/15/2020 0151)  Patient will be free from injury during hospitalization:   Assess patient's risk for falls and implement fall prevention plan of care per policy   Provide and maintain safe environment   Use appropriate transfer methods   Ensure appropriate safety devices are available at the bedside   Hourly rounding   Include patient/ family/ care giver in decisions related to safety   Provide alternative method of communication if needed (communication boards, writing)   Assess for  patients risk for elopement and implement Elopement Risk Plan per policy     Problem: Pain  Goal: Pain at adequate level as identified by patient  09/15/2020 0155 by Benjaman Kindler', RN  Outcome: Progressing  Flowsheets (Taken 09/15/2020 0151)  Pain at adequate level as identified by patient:   Identify patient comfort function goal   Assess for risk of opioid induced respiratory depression, including snoring/sleep apnea. Alert healthcare team of risk factors identified.   Assess pain on admission, during daily assessment and/or before any "as needed" intervention(s)   Reassess pain within 30-60 minutes of any procedure/intervention, per Pain Assessment, Intervention, Reassessment (AIR) Cycle   Evaluate patient's satisfaction with pain management progress   Evaluate if patient comfort function goal is met   Consult/collaborate with Pain Service   Offer non-pharmacological pain management interventions   Consult/collaborate with Physical Therapy, Occupational Therapy, and/or Speech Therapy   Include patient/patient care companion in decisions related to pain management as needed  09/15/2020 0151 by Benjaman Kindler', RN  Flowsheets (Taken 09/15/2020 0151)  Pain at adequate level as identified by patient:   Identify patient comfort function goal   Assess for risk of opioid induced respiratory depression, including snoring/sleep apnea. Alert healthcare team of risk factors identified.   Assess pain on admission, during daily assessment and/or before any "as needed" intervention(s)   Reassess pain within 30-60 minutes of any procedure/intervention, per Pain Assessment, Intervention, Reassessment (AIR) Cycle   Evaluate patient's satisfaction with pain management progress   Evaluate if patient comfort function goal is met  Consult/collaborate with Pain Service   Offer non-pharmacological pain management interventions   Consult/collaborate with Physical Therapy, Occupational Therapy, and/or Speech Therapy   Include  patient/patient care companion in decisions related to pain management as needed     Problem: Day of Admission - Stroke  Goal: Core/Quality measure requirements - Admission  09/15/2020 0155 by Benjaman Kindler', RN  Outcome: Progressing  Flowsheets (Taken 09/15/2020 0154)  Core/Quality measure requirements - Admission:   Document NIH Stroke Scale on admission   Document nursing swallow/dysphagia screen on admission. If patient fails, keep patient NPO (follow your hospital protocol on swallowing screening).   VTE Prevention: Ensure anticoagulant(s) administered and/or anti-embolism stockings/devices documented as ordered   Ensure antithrombotic administered or contraindication documented by LIP   If diagnosis or history of Atrial Fib/Atrial Flutter, ensure oral anticoagulation is initiated or contraindication documented by LIP   Ensure lipid panel ordered   Begin stroke education on admission (must include Modifiable Risk Factors, Warning Signs and Symptoms of Stroke, Activation of Emergency Medical System and Follow-up Appointments) Ensure handout has been given and documented.   Ensure PT/OT and/or SLP ordered  09/15/2020 0154 by Benjaman Kindler', RN  Flowsheets (Taken 09/15/2020 0154)  Core/Quality measure requirements - Admission:   Document NIH Stroke Scale on admission   Document nursing swallow/dysphagia screen on admission. If patient fails, keep patient NPO (follow your hospital protocol on swallowing screening).   VTE Prevention: Ensure anticoagulant(s) administered and/or anti-embolism stockings/devices documented as ordered   Ensure antithrombotic administered or contraindication documented by LIP   If diagnosis or history of Atrial Fib/Atrial Flutter, ensure oral anticoagulation is initiated or contraindication documented by LIP   Ensure lipid panel ordered   Begin stroke education on admission (must include Modifiable Risk Factors, Warning Signs and Symptoms of Stroke, Activation of Emergency Medical System  and Follow-up Appointments) Ensure handout has been given and documented.   Ensure PT/OT and/or SLP ordered

## 2020-09-15 NOTE — Consults (Signed)
[facility] CONSULT NOTE    Date Time: 09/15/20 9:18 AM  Patient Name: [name] Cook  Requesting Physician: Maple Mirza, MD     Signed by: Governor Specking, MD, MD, [facility] Spectralink [phone] 7am-7pm  [facility] Spect[employer] [phone] (NP); [phone] (MD) 7am-5pm  OFFICE Number [phone] 5pm-7am  MD LINE [phone]      Reason for Consultation:   Chest pain    Assessment:   45 y.o. female with a history of HTN, HL, CVA, seizures, who presents with chest pain, hypertensive urgency.     Plan:   Chest pain. No evidence of acute coronary syndrome by serial cardiac enzymes and EKG thus far.  Non-ischemic stress test 3 years ago with no new exertional symptoms. Suspect chest pain is from uncontrolled HTN. No need for ischemic eval at this time.  Hypertensive urgency. Resume home meds and follow.   Weakness. Neuro following with no evidence of acute CVA.   HL. On high-intensity statin.     History:   [name] Cook is a 45 y.o. female with a history of HTN, HL, CVA, seizures, who presents with chest pain, hypertensive urgency.     She awoke with chest heaviness that radiated to her shoulders and back. She had associated SOB. When she looked in the mirror, she noted a L-sided facial droop and had R-sided weakness so came to the ER. BP on admission was 231/93. She states her BPs are usually reasonably controlled. She has had intermittent chest heaviness since admission. She denies exertional chest pain but notes SOB with activity that is chronic.    Imaging:     Independent review of EKG shows NSR, normal axis, intervals, ST segments    Independent review of telemetry shows no events    Stress test 09/17/2017 was normal    Echo 09/16/2017 showed EF of 50-55%, mild LVH, norma diastolic function, no sig valvular disease      Past Medical History:     Past Medical History:   Diagnosis Date    Hypercholesteremia     Hypertension     Seizures     Stroke     2015 and 2017,  right side    TIA (transient ischemic attack) 2017       Past Surgical History:     Past Surgical History:   Procedure Laterality Date    APPENDECTOMY (OPEN)      CHOLECYSTECTOMY      EGD, BIOPSY N/A 04/16/2017    Procedure: EGD, BIOPSY;  Surgeon: Pershing Proud, MD;  Location: [facility];  Service: Gastroenterology;  Laterality: N/A;    HYSTERECTOMY         Family History:   No premature CAD.  Family History   Problem Relation Age of Onset    Myocardial Infarction Father 24    Deep vein thrombosis Father     No known problems Mother        Social History:     Social History     Socioeconomic History    Marital status: Single     Spouse name: Not on file    Number of children: Not on file    Years of education: Not on file    Highest education level: Not on file   Occupational History    Not on file   Tobacco Use    Smoking status: Never    Smokeless tobacco: Never   Vaping Use    Vaping Use:  Never used   Substance and Sexual Activity    Alcohol use: No    Drug use: No    Sexual activity: Not on file   Other Topics Concern    Not on file   Social History Narrative    Not on file     Social Determinants of Health     Financial Resource Strain: Not on file   Food Insecurity: Not on file   Transportation Needs: Not on file   Physical Activity: Not on file   Stress: Not on file   Social Connections: Not on file   Intimate Partner Violence: Not on file   Housing Stability: Not on file       Allergies:     Allergies   Allergen Reactions    Contrast [Iodinated Diagnostic Agents] Anaphylaxis     Patient woke up in ICU after having CT with contrast, last remembers being in CT    Fioricet [Butalbital-Apap-Caffeine] Hives    Bentyl [Dicyclomine] Edema    Fentanyl     Iodine     Motrin [Ibuprofen] Swelling    Percocet [Oxycodone-Acetaminophen]     Shellfish-Derived Products     Toradol [Ketorolac Tromethamine]     Tramadol     Tylenol [Acetaminophen] Hives       Medications:    Medications reviewed    Home Medications    Medications Prior to Admission   Medication Sig    albuterol sulfate HFA (PROVENTIL) 108 (90 Base) MCG/ACT inhaler Inhale 2 puffs into the lungs every 4 (four) hours as needed for Wheezing or Shortness of Breath    amLODIPine (NORVASC) 5 MG tablet Take 1 tablet (5 mg total) by mouth daily    aspirin 81 MG chewable tablet Chew 1 tablet (81 mg total) by mouth daily    atorvastatin (LIPITOR) 40 MG tablet Take by mouth.    LevETIRAcetam (KEPPRA PO) Take 500 mg by mouth 2 (two) times daily.       lidocaine (LIDODERM) 5 % Place 1 patch onto the skin every 24 hours Remove & Discard patch within 12 hours or as directed by MD    lisinopril (PRINIVIL,ZESTRIL) 40 MG tablet Take 40 mg by mouth daily.    multivitamin (MULTIVITAMIN) Tab Take 1 tablet by mouth daily    pantoprazole (PROTONIX) 40 MG tablet Take 1 tablet (40 mg total) by mouth daily    sodium hypochlorite (H-CHLOR 12) 0.125 % Solution Irrigate with as directed daily              Current Medications   Current Facility-Administered Medications   Medication Dose Route Frequency    amLODIPine  5 mg Oral Daily    aspirin  81 mg Oral Daily    aspirin  300 mg Rectal Once    atorvastatin  40 mg Oral QHS    enoxaparin  40 mg Subcutaneous Daily    levETIRAcetam  500 mg Oral Q12H SCH    lisinopril  40 mg Oral Daily    pantoprazole  40 mg Oral QAM AC                            Review of Systems:   All other systems reviewed and are negative except as stated above in the HPI.     Physical Exam:     Vitals:    09/15/20 0821   BP: 144/76   Pulse: 88   Resp: 18  Temp: 97.5 F (36.4 C)   SpO2: 95%     Temp (24hrs), Avg:97.9 F (36.6 C), Min:97.5 F (36.4 C), Max:98.6 F (37 C)      Intake and Output Summary (Last 24 hours) at Date Time  No intake or output data in the 24 hours ending 09/15/20 0918  Wt Readings from Last 3 Encounters:   09/15/20 148.6 kg (327 lb 9.6 oz)   09/14/20 148.3 kg (326 lb 15.1 oz)   03/13/20 146.1 kg (322 lb)        Vital signs reviewed    GENERAL:  Patient is in no acute distress   HEENT: Non-traumatic, no scleral icterus or conjunctival pallor, moist mucous membranes   NECK: No jugular venous distention, no carotid bruits   CARDIAC: Regular rate and rhythm, with normal S1 and S2, and no rubs, or gallops.  No murmurs  VASCULAR: 2+ radial and distal pulses bilaterally  LUNGS: Clear to auscultation bilaterally, symmetric chest expansion, no ronchi or wheezes, normal respiratory effort  ABDOMEN: Nontender, no rebound or guarding, non-distended, good bowel sounds   EXTREMITIES: No clubbing, cyanosis.  No evidence of arthritis.  No LE edema  SKIN: No rash or jaundice   NEUROLOGIC: Alert and oriented to time, place and person   PSYCH:  Normal mood and affect   MUSCULOSKELETAL: Grossly normal without significant defects.      Labs Reviewed:     CBC w/Diff   Recent Labs   Lab 09/15/20  0342 09/14/20  1620   WBC 9.74* 9.08   Hgb 13.8 12.5   Hematocrit 43.5 39.3   Platelets 143 157          Basic Metabolic Profile   Recent Labs   Lab 09/15/20  0324 09/14/20  1620   Sodium 141 141   Potassium 3.4* 3.7   Chloride 109 109   CO2 24 26   BUN 13.0 11.0   Creatinine 0.9 1.0   EGFR >60.0 >60.0   Glucose 110* 115*   Calcium 9.0 9.6            Cardiac Enzymes   Recent Labs   Lab 09/15/20  0324 09/14/20  1620   Troponin I <0.01 <0.01          Thyroid Studies   Recent Labs   Lab 09/15/20  0324   TSH 3.25          Cholesterol Panel   Recent Labs   Lab 09/15/20  0324   Cholesterol 177   Triglycerides 108   HDL 33*   LDL Calculated 122*          Coagulation Studies   Recent Labs   Lab 09/14/20  1620   PT 12.6   PT INR 1.1

## 2020-09-15 NOTE — Plan of Care (Addendum)
FOUR EYES SKIN ASSESSMENT NOTE    [name] Cook  June 28, 1975  [id number]    Braden Scale Score: 20    POC Initiated for Risk for Altered Skin No      Patient Assessed for Correct Mattress Surface Yes  *At risk patients with Braden Score less than 12 must be considered for specialty bed    Mepilex or Adhesive Foam Dressing applied to sacrum/heel if any PI risk factors present: No        If Wound/Pressure Injury present:  Wound/PI assessment documented in LDA (will be shown below): No    Admitting physician notified: No    Wound consult ordered: No        Was skin checked with incoming RN/PCT? Yes    Freida Busman, RN  September 15, 2020  1:48 PM    Second RN/PCT Name: Dois Davenport, PCT          Wound 10/15/19 Other (Comment) Abdomen Medial;Right open (Active)            Problem: Safety  Goal: Patient will be free from injury during hospitalization  Outcome: Progressing  Flowsheets (Taken 09/15/2020 1116)  Patient will be free from injury during hospitalization:   Provide and maintain safe environment   Ensure appropriate safety devices are available at the bedside   Include patient/ family/ care giver in decisions related to safety   Assess for patients risk for elopement and implement Elopement Risk Plan per policy     Problem: Pain  Goal: Pain at adequate level as identified by patient  Outcome: Progressing  Flowsheets (Taken 09/15/2020 1116)  Pain at adequate level as identified by patient:   Reassess pain within 30-60 minutes of any procedure/intervention, per Pain Assessment, Intervention, Reassessment (AIR) Cycle   Evaluate if patient comfort function goal is met   Evaluate patient's satisfaction with pain management progress   Consult/collaborate with Pain Service     Problem: Every Day - Stroke  Goal: Neurological status is stable or improving  Outcome: Progressing  Flowsheets (Taken 09/15/2020 1116)  Neurological status is stable or improving:   Perform CAM Assessment   Observe for seizure activity and initiate seizure  precautions if indicated   Re-assess NIH Stroke Scale for any change in status   Monitor/assess NIH Stroke Scale  Goal: Stable vital signs and fluid balance  Outcome: Progressing  Flowsheets (Taken 09/15/2020 1116)  Stable vital signs and fluid balance:   Encourage oral fluid intake   Monitor intake and output. Notify LIP if urine output is < 30 mL/hour.   Monitor and assess vitals every 4 hours or as ordered and hemodynamic parameters  Goal: Patient will maintain adequate oxygenation  Outcome: Progressing  Flowsheets (Taken 09/15/2020 1116)  Patient will maintain adequate oxygenation:   Maintain SpO2 of greater than 92%   Sleep Apnea: Assess if previously tested or on CPAP   Sleep Apnea: Encourage patient to speak to PCP regarding sleep apnea/sleep study ([place] only)  Goal: Patient's risk of aspiration will be minimized  Outcome: Progressing  Flowsheets (Taken 09/15/2020 1116)  Patient's risk of aspiration will be minimized:   Place swallow precaution signage above bed   Monitor/assess for signs of aspiration (tachypnea, cough, wheezing, clearing throat, hoarseness after eating, decrease in SaO2   Order modified texture diet as recommend by Speech Pathologist   Thicken liquids as recommended by Speech Pathologist  Goal: Nutritional intake is adequate  Outcome: Progressing  Flowsheets (Taken 09/15/2020 1116)  Nutritional intake is adequate:  Encourage/perform oral hygiene as appropriate   Encourage/administer dietary supplements as ordered (i.e. tube feed, TPN, oral, OGT/NGT, supplements)   Include patient/patient care companion in decisions related to nutrition   Assess anorexia, appetite, and amount of meal/food tolerated   Consult/collaborate with Speech Therapy (swallow evaluations)  Goal: Elimination patterns are normal or improving  Outcome: Progressing  Flowsheets (Taken 09/15/2020 1116)  Elimination patterns are normal or improving:   Monitor for abdominal distension   Reinforce education on foods that improve and  complicate bowel movements and how activity and medications can affect bowel movements   Consult/collaborate with Clinical Nutritionist   Assess for signs and symptoms of bleeding.  Report signs of bleeding to physician   Administer treatments as ordered  Goal: Neurovascular status is stable or improving  Outcome: Progressing  Flowsheets (Taken 09/15/2020 1116)  Neurovascular status is stable or improving:   Monitor/assess neurovascular status (pulses, capillary refill, pain, paresthesia, presence of edema)   Monitor/assess for signs of Venous Thrombus Emboli (edema of calf/thigh redness, pain)   Monitor/assess site of invasive procedure for signs of bleeding  Goal: Effective coping demonstrated  Outcome: Progressing  Flowsheets (Taken 09/15/2020 1116)  Effective coping demonstrated:   Offer reassurance to decrease anxiety   Assess/report to LIP uncontrolled anxiety, depression, or ineffective coping  Goal: Will be able to express needs and understand communication  Outcome: Progressing  Flowsheets (Taken 09/15/2020 1116)  Able to express needs and understand communication:   Consult/collaborate with Speech Language Pathology (SLP)   Consult/collaborate with Case Management/Social Work   Include patient care companion in decisions related to communication

## 2020-09-15 NOTE — Progress Notes (Signed)
[facility] Consultation Note                                       Date Time: 09/15/20 9:29 AM  Patient Name: [name] Cook  Requesting Physician: Maple Mirza, MD  Date of Admission: 09/14/2020    CC / Reason for Consultation: facial droop        Assessment:     44y/o woman with hx of prior CVA (per patient), HTN, HLD, seizures, migraines presenting with wake up deficits of left facial weakness, RLE weakness and right sided sensory deficits (involving face, arm and leg).     CT head imaging reviewed, no acute process noted. MRI brain negative for acute process.     Unclear etiology of her symptoms, no evidence of acute stroke on imaging. There does appear to be a functional component to her presentation.       Plan:     --outpatient neurology follow up, no further imaging indicated at this time  --PT evaluation      Past Medical Hx     Past Medical History:   Diagnosis Date    Hypercholesteremia     Hypertension     Seizures     Stroke     2015 and 2017, right side    TIA (transient ischemic attack) 2017          Past Surgical Hx:     Past Surgical History:   Procedure Laterality Date    APPENDECTOMY (OPEN)      CHOLECYSTECTOMY      EGD, BIOPSY N/A 04/16/2017    Procedure: EGD, BIOPSY;  Surgeon: Pershing Proud, MD;  Location: [facility];  Service: Gastroenterology;  Laterality: N/A;    HYSTERECTOMY          Family Medical History:      Family History   Problem Relation Age of Onset    Myocardial Infarction Father 51    Deep vein thrombosis Father     No known problems Mother        Social Hx     Social History     Socioeconomic History    Marital status: Single   Tobacco Use    Smoking status: Never    Smokeless tobacco: Never   Vaping Use    Vaping Use: Never used   Substance and Sexual Activity    Alcohol use: No    Drug use: No       Meds     Home :   Prior to Admission medications    Medication Sig Start Date End Date Taking? Authorizing Provider   albuterol sulfate HFA  (PROVENTIL) 108 (90 Base) MCG/ACT inhaler Inhale 2 puffs into the lungs every 4 (four) hours as needed for Wheezing or Shortness of Breath 03/13/20   Lorenda Cahill, PA   amLODIPine (NORVASC) 5 MG tablet Take 1 tablet (5 mg total) by mouth daily 10/18/19   Tirrell, Fransico Him, MD   aspirin 81 MG chewable tablet Chew 1 tablet (81 mg total) by mouth daily 09/19/17   Doy Hutching D, MD   atorvastatin (LIPITOR) 40 MG tablet Take by mouth.    [provider]   LevETIRAcetam (KEPPRA PO) Take 500 mg by mouth 2 (two) times daily.       [provider]   lidocaine (LIDODERM) 5 % Place 1 patch onto the skin every  24 hours Remove & Discard patch within 12 hours or as directed by MD 10/18/19   Doyle Askew, Fransico Him, MD   lisinopril (PRINIVIL,ZESTRIL) 40 MG tablet Take 40 mg by mouth daily.    [provider]   multivitamin (MULTIVITAMIN) Tab Take 1 tablet by mouth daily 10/17/19   Tirrell, Fransico Him, MD   pantoprazole (PROTONIX) 40 MG tablet Take 1 tablet (40 mg total) by mouth daily 10/30/18   Karlton Lemon, MD   sodium hypochlorite (H-CHLOR 12) 0.125 % Solution Irrigate with as directed daily 10/17/19   Tirrell, Fransico Him, MD      Inpatient :       Allergies    Contrast [iodinated diagnostic agents], Fioricet [butalbital-apap-caffeine], Bentyl [dicyclomine], Fentanyl, Iodine, Motrin [ibuprofen], Percocet [oxycodone-acetaminophen], Shellfish-derived products, Toradol [ketorolac tromethamine], Tramadol, and Tylenol [acetaminophen]      Review of Systems     All other systems were reviewed and are negative except for that mentioned in the HPI    Physical Exam:   Temp:  [97.5 F (36.4 C)-98.6 F (37 C)] 97.5 F (36.4 C)  Heart Rate:  [61-103] 88  Resp Rate:  [16-20] 18  BP: (108-231)/(68-124) 144/76       General: in no acute distress. Cooperative with the exam  Neck: supple  CVS: warm and well perfused  Resp: no respiratory distress  Extremities: no pedal edema, no rashes noted    Neurological Examination:  MSE:  A&Ox3, speech fluent without word-finding difficulty or paraphasic errors  CN: Pupils 4-->42mm b/l, EOMI, no nystagmus, VFFC, reports sensation in V1-V3 intact to LT, flattening of L NLF though face appears to be pulling to the right, forehead raises symmetrically, eye closure intact, no dysarthria.  Motor: No pronator drift in bilateral UE, RLE with drift but able to maintain antigravity  Sensory: LT diminished on the RUE and RLE        Labs:     Results       Procedure Component Value Units Date/Time    Lipid panel [[id number]]  (Abnormal) Collected: 09/15/20 0324    Specimen: Blood Updated: 09/15/20 0704     Cholesterol 177 mg/dL      Triglycerides 732 mg/dL      HDL 33 mg/dL      LDL Calculated 202 mg/dL      VLDL Calculated 22 mg/dL      Cholesterol / HDL Ratio 5.4 Index     Narrative:      Fasting    TSH [[id number]] Collected: 09/15/20 0324    Specimen: Blood Updated: 09/15/20 0536     TSH 3.25 uIU/mL     Narrative:      Fasting    Basic Metabolic Panel [[id number]]  (Abnormal) Collected: 09/15/20 0324    Specimen: Blood Updated: 09/15/20 0526     Glucose 110 mg/dL      BUN 16.0 mg/dL      Creatinine 0.9 mg/dL      Calcium 9.0 mg/dL      Sodium 737 mEq/L      Potassium 3.4 mEq/L      Chloride 109 mEq/L      CO2 24 mEq/L      Anion Gap 8.0    Narrative:      Fasting    GFR [[id number]] Collected: 09/15/20 0324     Updated: 09/15/20 0526     EGFR >60.0    Narrative:      Fasting    Troponin I [[id number]] Collected:  09/15/20 0324    Specimen: Blood Updated: 09/15/20 0522     Troponin I <0.01 ng/mL     CBC without differential [[id number]]  (Abnormal) Collected: 09/15/20 0342    Specimen: Blood Updated: 09/15/20 0519     WBC 9.74 x10 3/uL      Hgb 13.8 g/dL      Hematocrit 40.9 %      Platelets 143 x10 3/uL      RBC 4.99 x10 6/uL      MCV 87.2 fL      MCH 27.7 pg      MCHC 31.7 g/dL      RDW 15 %      MPV 14.2 fL      Nucleated RBC 0.0 /100 WBC      Absolute NRBC 0.00 x10 3/uL     Narrative:      Fasting    Hemoglobin  A1C [[id number]] Collected: 09/15/20 0342    Specimen: Blood Updated: 09/15/20 0454    Narrative:      Fasting    COVID-19 (SARS-COV-2) ([facility]) [[id number]] Collected: 09/14/20 1802    Specimen: Nasopharyngeal Swab from Nasopharynx Updated: 09/14/20 1832     Purpose of COVID testing Screening     SARS-CoV-2 Specimen Source Nasopharyngeal     SARS CoV-2 Negative    Narrative:      o Collect and clearly label specimen type:  o Upper respiratory specimen: One Nasopharyngeal Dry Swab NO  Transport Media.  o Hand deliver to laboratory ASAP  Indication for testing->Extended care facility admission to  semi private room  Screening  [place]uled by [id number] at 09/14/2020 17:59 Reason: .  Res[place]uled [place] [id number] at 09/14/2020 18:00 Reason: Marland Kitchen    Glucose Whole Blood - POCT [[id number]] Collected: 09/14/20 1724     Updated: 09/14/20 1726     Whole Blood Glucose POCT 94 mg/dL     Manual Differential [[id number]]  (Abnormal) Collected: 09/14/20 1620     Updated: 09/14/20 1715     Segmented Neutrophils 62 %      Band Neutrophils 0 %      Lymphocytes Manual 32 %      Monocytes Manual 2 %      Eosinophils Manual 2 %      Basophils Manual 2 %      Neutrophils Absolute Manual 5.63 x10 3/uL      Band Neutrophils Absolute 0.00 x10 3/uL      Lymphocytes Absolute Manual 2.91 x10 3/uL      Monocytes Absolute 0.18 x10 3/uL      Eosinophils Absolute Manual 0.18 x10 3/uL      Basophils Absolute Manual 0.18 x10 3/uL     Cell MorpHology [[id number]]  (Abnormal) Collected: 09/14/20 1620     Updated: 09/14/20 1715     Cell Morphology Abnormal     Platelet Estimate Normal     Ovalocytes Present    CBC and differential [[id number]]  (Abnormal) Collected: 09/14/20 1620    Specimen: Blood Updated: 09/14/20 1715     WBC 9.08 x10 3/uL      Hgb 12.5 g/dL      Hematocrit 33.2 %      Platelets 157 x10 3/uL      RBC 4.51 x10 6/uL      MCV 87.1 fL      MCH 27.7 pg      MCHC 31.8 g/dL      RDW 14 %      MPV 12.6  fL      Nucleated RBC 0.0 /100 WBC      Absolute NRBC  0.00 x10 3/uL     Beta HCG Quantitative [[id number]] Collected: 09/14/20 1620     Updated: 09/14/20 1702     hCG, Quant. <2.4 mIU/mL     Comprehensive metabolic panel [[id number]]  (Abnormal) Collected: 09/14/20 1620    Specimen: Blood Updated: 09/14/20 1702     Glucose 115 mg/dL      BUN 13.2 mg/dL      Creatinine 1.0 mg/dL      Sodium 440 mEq/L      Potassium 3.7 mEq/L      Chloride 109 mEq/L      CO2 26 mEq/L      Calcium 9.6 mg/dL      Protein, Total 8.6 g/dL      Albumin 4.1 g/dL      AST (SGOT) 20 U/L      ALT 25 U/L      Alkaline Phosphatase 136 U/L      Bilirubin, Total 0.9 mg/dL      Globulin 4.5 g/dL      Albumin/Globulin Ratio 0.9     Anion Gap 6.0    GFR [[id number]] Collected: 09/14/20 1620     Updated: 09/14/20 1702     EGFR >60.0    Troponin I [[id number]] Collected: 09/14/20 1620    Specimen: Blood Updated: 09/14/20 1657     Troponin I <0.01 ng/mL     Prothrombin time/INR [[id number]] Collected: 09/14/20 1620    Specimen: Blood Updated: 09/14/20 1645     PT 12.6 sec      PT INR 1.1            Rads:     Results for orders placed or performed during the hospital encounter of 01/28/19   CT Head without Contrast    Narrative    CLINICAL INDICATION:  Facial weakness, left-sided facial droop    TECHNIQUE:  Axial noncontrast CT images were obtained from the skull  base to the vertex. A combination of a automatic exposure control,  adjustment of the mA and/or kV  according to patient size and/or use of  iterative reconstruction technique was utilized.      COMPARISON:  10/29/2018    INTERPRETATION: Subarachnoid spaces and ventricles are within normal  limits for age. There is no acute intracranial hemorrhage, extra-axial  collection, or mass-effect. Gray-white differentiation is maintained.  Visualized paranasal sinuses and mastoid air cells are clear.      Impression      No acute intracranial abnormality.    Wyatt Portela, MD   01/28/2019 11:49 AM   Results for orders placed or performed during the hospital  encounter of 10/29/18   MRI Brain WO Contrast    Narrative    MRI BRAIN WO CONTRAST:10/30/2018 2:55 PM     CLINICAL HISTORY: right sided weakness    TECHNIQUE: Noncontrast Brain MR        COMPARISON: No change from MRI BRAIN WO CONTRAST with report dated  09/05/2018 6:24 PM.  .    FINDINGS:     Extra axial spaces: Normal in size and morphology for the patient's age.  Hemorrhage: None.  Ventricular system: Normal in size and morphology for the patient's age.  Basal cisterns: Normal.  Cerebral parenchyma: Tiny focal region of high T2 and FLAIR signal  intensity in the left frontal lobe on image 17 series 7 with a second  similar findings seen  higher in the left frontal lobe on image 20 series  7, stable. No new findings   Midline shift: None.  Cerebellum: Normal.  Brainstem: Normal.    OTHER:    Calvarium: Normal.  Vascular system: Normal.  Visualized Paranasal sinuses: Clear.  Visualized Orbits: Normal.  Visualized upper cervical spine: Normal.  Sella and skull base: Normal.      Impression    No acute abnormality. No change from 09/05/2018          Laurena Slimmer, MD   10/30/2018 3:57 PM   MRA Head (intracranial) Without Contrast    Narrative    MR ANGIOGRAM HEAD WO CONTRAST    CLINICAL INDICATION:   weakness R sided    COMPARISON: No change from MR ANGIOGRAM HEAD WO CONTRAST with report  dated 05/13/2016 9:10 AM.      TECHNIQUE: Routine MRA of the brain without contrast with MIP images.    FINDINGS:  There is no evidence for focal hemodynamically significant  stenosis of either intracranial internal carotid artery. The anterior,  middle, and posterior cerebral arteries appear unremarkable bilaterally.  The vertebrobasilar system appears unremarkable without focal  hemodynamically significant stenosis. There is no MR evidence to suggest  large aneurysm of the circle of Willis.    Impression     Unremarkable MRA of the brain.    Laurena Slimmer, MD   10/30/2018 4:08 PM     Results for orders placed or performed during the  hospital encounter of 08/18/15   MRI Brain With / Without Contrast    Narrative    MRI BRAIN W WO CONTRAST  CLINICAL HISTORY:Right facial droop    COMPARISON: None available.    TECHNIQUE: Multiplanar multisequence imaging performed. Following 10 cc  Gadavist axial and coronal T1-weighted images were obtained.     INTERPRETATION: Small hyperintense foci noted in the left frontal  subcortical white matter and the centrum semiovale. No other focal  abnormality.  No area of restricted diffusion identified. No acute  hemorrhage, mass lesion or mass effect. The brainstem, cerebellum and  the craniocervical junction are within normal limits. The contrast  enhancement pattern is normal.      Impression         [No acute process Specifically there is no acute  cortical infarct, mass or evidence of hemorrhage.       Heron Nay, MD   08/19/2015 8:15 AM             Elspeth Cho  [facility]  Available on Epic chat

## 2020-09-15 NOTE — Discharge Instr - AVS First Page (Addendum)
Reason for your Hospital Admission:  Chest pain  Facial droop and weakness      Instructions for after your discharge:  You did not have a stroke or heart attack.  Follow up with your primary care doctor and a neurologist.  Stay on your blood pressure medications as your blood pressure was >230 when you arrived! Amlodipine and lisinopril  Meclizine is for dizziness  Your spine MRI shows mild changes in the spine, you can follow up with Dr Anselm Lis and your primary care.  Please follow up with a cardiologist for stress test given your repeated admissions for chest pain.    Take care!  Sincerely  Dr [facility] Discharge Information    Your doctor has ordered Physical Therapy and Occupational Therapy in-home service(s) for you while you recuperate at home, to assist you in the transition from hospital to home.    The agency that you or your representative chose to provide the service:  Name of Home Health Agency Placement: Other (comment box) [facility])] [phone]      [employer]:  Name of DME Agency: [facility] - [place]] [phone] Bariatric Rolling Walker to be delivered to patients room.      The above services were set up by:  Marcheta Grammes, RN [facility] Liaison) Phone [phone]      IF YOU HAVE NOT HEARD FROM YOUR HOME YOUR HOME HEALTH AGENCY WITHIN 24-48 HOURS AFTER DISCHARGE PLEASE CALL YOUR AGENCY TO ARRANGE A TIME FOR YOUR FIRST VISIT. FOR ANY SCHEDULING CONCERNS OR QUESTIONS RELATED TO HOME HEALTH, SUCH AS TIME OR DATE PLEASE CONTACT YOUR HOME HEALTH AGENCY AT THE NUMBER LISTED ABOVE.

## 2020-09-15 NOTE — Progress Notes (Signed)
Patient refused lisinpril this am. BP still elevated. Will offer lisinpril, hydralazine if refuses.    Complaints of dizziness, nausea, not feeling good. Will keep overnight, give meclizine and zofran as needed.

## 2020-09-15 NOTE — Discharge Summary (Signed)
[facility]      Patient: [name] Cook  Admission Date: 09/14/2020   DOB: 1976-03-05  Discharge Date: 09/15/2020    MRN: [id number]  Discharge Attending:Dawan Farney Gwenlyn Found, MD     Referring Physician: Letitia Libra, MD  PCP: Letitia Libra, MD       DISCHARGE SUMMARY     Discharge Information   Admission Diagnosis:   Hypertensive urgency    Discharge Diagnosis:   Active Hospital Problems    Diagnosis    Right sided weakness    Hypertensive urgency    Facial droop    Chest pain        Admission Condition: fair  Discharge Condition: improved  Consultants: neuro, card  Functional Status: baseline  Discharged to: home    Discharge Medications:     Medication List        CHANGE how you take these medications      atorvastatin 40 MG tablet  Commonly known as: LIPITOR  Take 1 tablet (40 mg total) by mouth daily  What changed:   how much to take  when to take this            CONTINUE taking these medications      albuterol sulfate HFA 108 (90 Base) MCG/ACT inhaler  Commonly known as: PROVENTIL  Inhale 2 puffs into the lungs every 4 (four) hours as needed for Wheezing or Shortness of Breath     amLODIPine 5 MG tablet  Commonly known as: NORVASC  Take 1 tablet (5 mg total) by mouth daily     aspirin 81 MG chewable tablet  Chew 1 tablet (81 mg total) by mouth daily     KEPPRA PO     lidocaine 5 %  Commonly known as: LIDODERM  Place 1 patch onto the skin every 24 hours Remove & Discard patch within 12 hours or as directed by MD     lisinopril 40 MG tablet  Commonly known as: ZESTRIL  Take 1 tablet (40 mg total) by mouth daily     multivitamin Tabs  Take 1 tablet by mouth daily     pantoprazole 40 MG tablet  Commonly known as: PROTONIX  Take 1 tablet (40 mg total) by mouth daily            STOP taking these medications      sodium hypochlorite 0.125 % Soln  Commonly known as: H-CHLOR 12               Where to Get Your Medications        These medications were sent to [id number] - [place] - [address]  [address], [place] [address]      Phone: [phone]   amLODIPine 5 MG tablet  atorvastatin 40 MG tablet  lisinopril 40 MG tablet             Hospital Course   Presentation History   [name] Cook is a 45 y.o. female with a PMHx of hypertension, dyslipidemia, seizures, TIA/stroke who presented with chest pain and left facial weakness/right lower extremity weakness/right-sided sensory deficits  Patient reported she woke up around 7:00 in the morning with symptoms of chest pressure/heaviness nonradiating.  She also noted left-sided facial droop and right lower extremity weakness along with paresthesia in the face, arm and leg.  She went to ER for evaluation.  CT head showed no acute process.  Patient was not a tPA candidate as  LKW>4.5 hours prior to presentation to ER  Blood pressure noted to be 190/101 and up to 215/96  Patient was seen by neurology.  Recommended MRI of the brain which was unremarkable  Troponin negative.  EKG no acute ischemic changes  Patient denies headache, trouble speaking, double vision, vertigo, nausea, vomiting, shortness of breath    See HPI for details.    Hospital Course (0 Days)     Right sided weakness  Right facial droop   Chest pressure intermittent since waking up  HTN urgency  Morbid obesity bmi 47  Hypokalemia  H/o seizures, migraine  H/o asthma  Patient has had several admissions for chest pain and stroke symptoms. Seen by neurology, MRI done and negative for stroke. Seen by cardiology, trop/ekg normal, recommends control of blood pressure. Will recommend outpatient cardiology follow up and possible stress test.  BP 231/93 on admission, now 140-150 sbp with home meds. Will continue them and discharge home.  Despite evidencing weakness when right UE/LE examined, patient ambulating well to bathroom.    Procedures/Imaging:   Upon my review: CT Head WO Contrast    Result Date: 09/14/2020  No CT evidence of acute intracranial pathology. [name], MD   09/14/2020 4:40 PM    MRI Brain WO Contrast    Result Date: 09/14/2020  No acute intracranial abnormality. 2 punctate foci of T2/FLAIR hyperintensity within the left centrum semiovale are unchanged from the prior study, and can be seen in the setting of chronic microvascular ischemic change or migraine. Alric Seton MD, MD  09/14/2020 9:05 PM    XR Chest AP Portable    Result Date: 09/14/2020  Normal chest radiograph. Alric Seton MD, MD  09/14/2020 11:19 PM         Best Practices   Was the patient admitted with either a CHF Exacerbation or Pneumonia? no     Progress Note/Physical Exam at Discharge     Subjective: feeling better. States her right sided weakness comes and goes, chest pain comes and goes at home but had been doing well. Today no chest pain sob fever chills nausea/vomiting has been ambulating.    Vitals:    09/15/20 0128 09/15/20 0311 09/15/20 0821 09/15/20 1206   BP: 108/68 157/84 144/76 (!) 150/94   Pulse: 77 (!) 103 88 (!) 58   Resp:  17 18 18    Temp:  97.7 F (36.5 C) 97.5 F (36.4 C) 97.7 F (36.5 C)   TempSrc:  Oral Oral Oral   SpO2: 90% 100% 95% 94%   Weight:   149.7 kg (330 lb)    Height:           General: NAD, AAOx3  HEENT: perrla, eomi, sclera anicteric, OP: Clear, MMM  Neck: supple, FROM, no LAD  Cardiovascular: RRR, no m/r/g  Lungs: CTAB, no w/r/r  Abdomen: soft, +BS, NT/ND, no masses, no g/r  Extremities: no C/C/E  Skin: no rashes or lesions noted  Neuro: shows right facial droop and right UE/RLE weakness on exam       Diagnostics     Labs/Studies Pending at Discharge: no    Last Labs   Recent Labs   Lab 09/15/20  0342 09/14/20  1620   WBC 9.74* 9.08   RBC 4.99 4.51   Hgb 13.8 12.5   Hematocrit 43.5 39.3   MCV 87.2 87.1   Platelets 143 157       Recent Labs   Lab 09/15/20  0324 09/14/20  1620  Sodium 141 141   Potassium 3.4* 3.7   Chloride 109 109   CO2 24 26   BUN 13.0 11.0   Creatinine 0.9 1.0   Glucose 110* 115*   Calcium 9.0 9.6       Microbiology Results (last 15 days)       Procedure  Component Value Units Date/Time    COVID-19 (SARS-COV-2) [facility]) [[id number]] Collected: 09/14/20 1802    Order Status: Completed Specimen: Nasopharyngeal Swab from Nasopharynx Updated: 09/14/20 1832     Purpose of COVID testing Screening     SARS-CoV-2 Specimen Source Nasopharyngeal     SARS CoV-2 Negative     Comment: Test performed using the Abbott ID NOW EUA assay.  Please see Fact Sheets for patients and providers located at:  http://www.rice.biz/    This test is for the qualitative detection of SARS-CoV-2  (COVID19) nucleic acid. Viral nucleic acids may persist in vivo,  independent of viability. Detection of viral nucleic acid does  not imply the presence of infectious virus, or that virus  nucleic acid is the cause of clinical symptoms. Negative  results should be treated as presumptive and, if inconsistent  with clinical signs and symptoms or necessary for patient  management, should be tested with an alternative molecular  assay. Negative results do not preclude SARS-CoV-2 infection  and should not be used as the sole basis for patient  management decisions. Invalid results may be due to inhibiting  substances in the specimen and recollection should occur.         Narrative:      o Collect and clearly label specimen type:  o Upper respiratory specimen: One Nasopharyngeal Dry Swab NO  Transport Media.  o Hand deliver to laboratory ASAP  Indication for testing->Extended care facility admission to  semi private room  Screening  Rescheduled by [phone] at 09/14/2020 17:59 Reason: .  Rescheduled by [id number] at 09/14/2020 18:00 Reason: .             Patient Instructions   Discharge Diet: reg  Discharge Activity: as tol    Follow Up Appointment:       Time spent examining patient, discussing with patient/family regarding hospital course, chart review, reconciling medications and discharge planning: 45 minutes.    Signed,  Maple Mirza, MD    12:50 PM 09/15/2020

## 2020-09-15 NOTE — PT Eval Note (Addendum)
[facility]  Physical Therapy Attempt Note    Patient:  [name] Cook MRN#:  [id number]  Unit:  [facility] Room/Bed:  V4098/J1914-N      Physical Therapy evaluation attempted on 09/15/2020 at 1:13 PM unable to complete secondary to Patient not appropriate for therapy at this time 2/2 pt with N/V and is hypertensive (126/121).  Physical therapy will follow up later today as able.     RN aware. MD informed via secure chat.      Carney Living, PT, DPT  Physical Therapist  Physical Medicine & Rehabilitation  [phone]  Mon-Thur 7-5:30pm  09/15/2020 1:14 PM

## 2020-09-15 NOTE — Plan of Care (Signed)
Patient awake alert and oriented. Room air , complains of chest pain denies SOB   Attached to the tele NSR  PRN morphine given   Will continue to monitor  vital signs and lab results   Problem: Safety  Goal: Patient will be free from injury during hospitalization  Outcome: Progressing  Flowsheets (Taken 09/15/2020 1116 by Freida Busman, RN)  Patient will be free from injury during hospitalization:   Provide and maintain safe environment   Ensure appropriate safety devices are available at the bedside   Include patient/ family/ care giver in decisions related to safety   Assess for patients risk for elopement and implement Elopement Risk Plan per policy     Problem: Pain  Goal: Pain at adequate level as identified by patient  Outcome: Progressing  Flowsheets (Taken 09/15/2020 1116 by Layla Maw, [place], RN)  Pain at adequate level as identified by patient:   Reassess pain within 30-60 minutes of any procedure/intervention, per Pain Assessment, Intervention, Reassessment (AIR) Cycle   Evaluate if patient comfort function goal is met   Evaluate patient's satisfaction with pain management progress   Consult/collaborate with Pain Service     Problem: Every Day - Stroke  Goal: Neurological status is stable or improving  Outcome: Progressing  Flowsheets (Taken 09/15/2020 1116 by Freida Busman, RN)  Neurological status is stable or improving:   Perform CAM Assessment   Observe for seizure activity and initiate seizure precautions if indicated   Re-assess NIH Stroke Scale for any change in status   Monitor/assess NIH Stroke Scale  Goal: Stable vital signs and fluid balance  Outcome: Progressing  Flowsheets (Taken 09/15/2020 1116 by Layla Maw, [place], RN)  Stable vital signs and fluid balance:   Encourage oral fluid intake   Monitor intake and output. Notify LIP if urine output is < 30 mL/hour.   Monitor and assess vitals every 4 hours or as ordered and hemodynamic parameters  Goal: Patient will maintain adequate  oxygenation  Outcome: Progressing  Flowsheets (Taken 09/15/2020 1116 by Freida Busman, RN)  Patient will maintain adequate oxygenation:   Maintain SpO2 of greater than 92%   Sleep Apnea: Assess if previously tested or on CPAP   Sleep Apnea: Encourage patient to speak to PCP regarding sleep apnea/sleep study ([facility] only)  Goal: Patient's risk of aspiration will be minimized  Outcome: Progressing  Flowsheets (Taken 09/15/2020 1116 by Freida Busman, RN)  Patient's risk of aspiration will be minimized:   Place swallow precaution signage above bed   Monitor/assess for signs of aspiration (tachypnea, cough, wheezing, clearing throat, hoarseness after eating, decrease in SaO2   Order modified texture diet as recommend by Speech Pathologist   Thicken liquids as recommended by Speech Pathologist  Goal: Nutritional intake is adequate  Outcome: Progressing  Flowsheets (Taken 09/15/2020 1116 by Layla Maw, [place], RN)  Nutritional intake is adequate:   Encourage/perform oral hygiene as appropriate   Encourage/administer dietary supplements as ordered (i.e. tube feed, TPN, oral, OGT/NGT, supplements)   Include patient/patient care companion in decisions related to nutrition   Assess anorexia, appetite, and amount of meal/food tolerated   Consult/collaborate with Speech Therapy (swallow evaluations)

## 2020-09-15 NOTE — PT Eval Note (Signed)
[facility]  Physical Therapy Evaluation and Treatment    Patient: [name] Cook  MRN#: [id number]  Unit: 25 [facility]  Bed: V4098/J1914-N    Time of Evaluation and Treatment:  Time Calculation  PT Received On: 09/15/20  Start Time: 1550  Stop Time: 1612  Time Calculation (min): 22 min    Evaluation Time: 10 minutes  Treatment Time: 12 minutes    Chart Review and Collaboration with Care Team: 6 minutes, not included in above time    PT Visit Number: 1    Consult received for [name] for PT Evaluation and Treatment.  Patient's medical condition is appropriate for Physical therapy intervention at this time.    ___________________________________________________    POST ACUTE CARE THERAPY RECOMMENDATIONS:   Discharge Recommendation: Home with supervision, Home with home health PT, Home with home health OT (pending management of symptoms)      DME Recommended for Discharge: Front wheel walker      ACUTE CARE THERAPY RECOMMENDATIONS:  Pt would benefit from Physical Therapy to address deficits and increase functional independence.     Is an Occupational Therapy Evaluation Indicated at this time? No, an acute care OT evaluation is not indicated yet; PT will continue to assess.      (Therapy recommendations are subject to change with patient status.  Please refer to the most recent PT/OT note for up-to-date recommendations.)  ___________________________________________________      Activity Orders:  PT eval and treat and progressive mobility protocol    Precautions and Contraindications:  Precautions  Weight Bearing Status: no restrictions  Other Precautions: falls, obese    Personal Protective Equipment (PPE)  gloves, procedure mask, eye shield/covering, shoe covers, and pt wore procedure mask    Medical Diagnosis:  Facial droop [R29.810]  Right leg weakness [R29.898]  Chest pain [R07.9]    History of Present Illness:  [name] Cook is a 45 y.o. female admitted on 09/14/2020 with a history of HTN, HL,  CVA, seizures, who presents with chest pain, hypertensive urgency.      She awoke with chest heaviness that radiated to her shoulders and back. She had associated SOB. When she looked in the mirror, she noted a L-sided facial droop and had R-sided weakness so came to the ER. BP on admission was 231/93. She states her BPs are usually reasonably controlled. She has had intermittent chest heaviness since admission. She denies exertional chest pain but notes SOB with activity that is chronic.     CT head imaging reviewed, no acute process noted. MRI brain negative for acute process.      Patient Active Problem List   Diagnosis    Chest pain    Stroke-like symptoms    Seizures    Facial droop    Tension type headache    History of stroke    Class 2 obesity in adult    Hematemesis    Blood loss anemia    HLD (hyperlipidemia)    Numbness and tingling    Hemorrhoids    Right hemiparesis    Nonspecific abnormal electroencephalogram (EEG)    Headache    Abdominal pain    Weakness    Cellulitis of other specified site    Wound dehiscence    Hypertensive urgency    History of seizure disorder    COVID-19    HTN (hypertension)    Right sided weakness       Past Medical/Surgical History:  Past Medical History:   Diagnosis  Date    Hypercholesteremia     Hypertension     Seizures     Stroke     2015 and 2017, right side    TIA (transient ischemic attack) 2017     Past Surgical History:   Procedure Laterality Date    APPENDECTOMY (OPEN)      CHOLECYSTECTOMY      EGD, BIOPSY N/A 04/16/2017    Procedure: EGD, BIOPSY;  Surgeon: Pershing Proud, MD;  Location: [facility];  Service: Gastroenterology;  Laterality: N/A;    HYSTERECTOMY         X-Rays/Tests/Labs:  Lab Results   Component Value Date/Time    HGB 13.8 09/15/2020 03:42 AM    HCT 43.5 09/15/2020 03:42 AM    K 3.4 (L) 09/15/2020 03:24 AM    NA 141 09/15/2020 03:24 AM    INR 1.1 09/14/2020 04:20 PM    TROPI <0.01 09/15/2020 03:24 AM    TROPI <0.01 09/14/2020 04:20 PM    TROPI  <0.01 03/13/2020 08:56 AM    TROPI <0.01 03/13/2020 01:26 AM       All imaging reviewed, please see chart for details.    Social History:  Prior Level of Function  Prior level of function: Ambulates independently, Independent with ADLs  Baseline Activity Level: Household ambulation, Community ambulation  Driving: independent  Cooking: Yes  Employment: Unemployed  DME Currently at Home:  (none)    Home Living Arrangements  Living Arrangements: Spouse/significant other  Type of Home: House  Home Layout: Multi-level, Stairs to enter with rails (add number in comment) (10 STE and 12 up to bedroom level)  Bathroom Shower/Tub: Medical sales representative: Standard  Bathroom Accessibility: Accessible, Accessible via walker  DME Currently at Home:  (none)  Home Living - Notes / Comments: Pt reports her spouse would be able to assist her as needed. She does report have a hx of falls, with her last fall 2 months ago      Subjective:  Patient is agreeable to participation in the therapy session. Nursing clears patient for therapy.     Pain Assessment  Pain Assessment: No/denies pain      Objective:  Observation of Patient/Vital Signs:    Vitals: supine at rest    09/15/20   BP: (!) 169/96   Pulse: 89   Resp:    Temp:    SpO2: 95%         Inspection/Posture: Pt received supine in bed    Cognitive Status and Neuro Exam:  Cognition/Neuro Status  Arousal/Alertness: Appropriate responses to stimuli  Attention Span: Appears intact  Orientation Level: Oriented X4  Memory: Appears intact  Following Commands: Follows all commands and directions without difficulty  Safety Awareness: minimal verbal instruction  Insights: Fully aware of deficits;Educated in safety awareness  Problem Solving: minimal assistance  Behavior: attentive;calm;cooperative  Motor Planning: intact  Coordination: intact    Musculoskeletal Examination  Gross ROM  Neck/Trunk ROM: within functional limits  Right Upper Extremity ROM: within functional limits  Left  Upper Extremity ROM: within functional limits  Right Lower Extremity ROM: within functional limits  Left Lower Extremity ROM: within functional limits  Gross Strength  Right Upper Extremity Strength: 4-/5  Left Upper Extremity Strength: 4-/5  Right Lower Extremity Strength: 4-/5  Left Lower Extremity Strength: 4-/5       Functional Mobility:  Functional Mobility  Supine to Sit: Stand by Assist;Increased Time;Increased Effort  Scooting to HOB: Modified independent  Scooting  to EOB: Modified Independent  Sit to Supine: Stand by Assist  Sit to Stand: Contact Guard Assist;Increased Time;Increased Effort;using bedrail;bed elevated;with instruction for hand placement to increase safety (using RW)  Stand to Sit: Contact Guard Assist  Transfers  Bed to Chair: Cabin crew Used for Functional Transfer: front-wheeled walker  Locomotion  Ambulation: Contact Guard Assist;with front-wheeled walker (17ft x 2)  Pattern: Wide BOS;Step through;decreased step length;decreased cadence     Balance  Balance  Sitting - Static: Good  Sitting - Dynamic: Good  Standing - Static: Fair (w/ RW)  Standing - Dynamic: Fair (w/ RW)    Participation and Activity Tolerance  Participation and Endurance  Participation Effort: fair  Endurance: Tolerates < 10 min exercise, no significant change in vital signs  [place] Dyspnea Scale: 0 Dyspnea    Educated the Patient to role of physical therapy, plan of care, goals of therapy and safety with mobility and ADLs, energy conservation techniques, pursed lip breathing, home safety with verbalized understanding  and demonstrated understanding.    Patient left in bed with alarm and all other medical equipment in place and call bell and all personal items/needs within reach.  RN notified of session outcome. Pt's spouse in room at end of session.       Assessment:  Maylie Ashton is a 45 y.o. female admitted 09/14/2020.  PT Assessment  Assessment: Gait impairment;Decreased balance;Decreased  functional mobility;Decreased LE strength;Decreased UE strength;Decreased safety/judgement during functional mobility;Decreased endurance/activity tolerance  Prognosis: Good;With continued PT status post acute discharge;Ongoing PT assessment needed  Progress: Slow progress, decreased activity tolerance      Treatment: Pt continues to be limited by bouts of dizziness and nausea however was able to tolerate ambulating short distances in the room w/ no major LOB noted. She did require one seated rest break 2/2 fatigue and her dizziness, both RN and MD were informed of this. Further OOB mobility was deferred and pt was assisted back to bed. She was educated on proper positioning to help maximize comfort.  She was also provided education on breathing techniques and pacing to maximize her overall activity tolerance. All OOB mobility performed with use of gait belt for increased pt and therapist safety. Vitals were assessed throughout session and remained stable.  Reviewed PT plan of care and D/C recommendation. Reinforced  use of nursing assistance for safety and use of call bell. Pt verbalized understanding and agreement with plan.  Addressed all pt questions and concerns.        Plan:  Treatment/Interventions: Continued evaluation, Compensatory technique education, Bed mobility, Equipment eval/education, Patient/family training, Endurance training, LE strengthening/ROM, Functional transfer training, Neuromuscular re-education, Gait training, Stair training, Exercise  PT Frequency: 1-2x/wk  Risks/Benefits/POC Discussed with Pt/Family: With patient    PMP Activity: Step 5 - Chair  Distance Walked (ft) (Step 6,7): 10 Feet (1ft x 2)      Goals:  Goals  Goal Formulation: With patient  Time for Goal Acheivement: By time of discharge  Goals: Select goal  Pt Will Go Supine To Sit: independent, to maximize functional mobility and independence, Partly met  Pt Will Perform Sit To Supine: independent, to maximize functional  mobility and independence, Partly met  Pt Will Perform Sit to Stand: modified independent, to maximize functional mobility and independence, Partly met (using RW or LRAD)  Pt Will Transfer Bed/Chair: with rolling walker, modified independent, to maximize functional mobility and independence, Partly met (or using LRAD)  Pt Will Ambulate: 51-100 feet,  with rolling walker, modified independent, to maximize functional mobility and independence, Partly met (or using LRAD)  Pt Will Go Up / Down Stairs: 1 flight, modified independent, With rail, to maximize functional mobility and independence    Carney Living, PT, DPT  Physical Therapist  Physical Medicine & Rehabilitation  [phone]  Mon-Thur 7-5:30pm  09/15/2020 5:04 PM

## 2020-09-15 NOTE — Progress Notes (Addendum)
Patient with episode of vomiting. Some difficulty getting to bathroom with nursing.

## 2020-09-15 NOTE — UM Notes (Signed)
09/14/20 2132  Admit to Observation  Once        Diagnosis: Chest Pain    Level of Care: Intermediate Care    Patient Class: Observation            MEDICAID HMO/[employer] MD MEDICAID [employer]  Primary Coverage  Auth Number   NPR     H&P: 45 y.o. female with a PMHx of hypertension, dyslipidemia, seizures, TIA/stroke who presented with chest pain and left facial weakness/right lower extremity weakness/right-sided sensory deficits  Patient reported she woke up around 7:00 in the morning with symptoms of chest pressure/heaviness nonradiating.  She also noted left-sided facial droop and right lower extremity weakness along with paresthesia in the face, arm and leg.  She went to ER for evaluation.  CT head showed no acute process.  Patient was not a tPA candidate as LKW>4.5 hours prior to presentation to ER  Blood pressure noted to be 190/101 and up to 215/96  Patient was seen by neurology.  Recommended MRI of the brain which was unremarkable  Troponin negative.  EKG no acute ischemic changes      Past Medical History:   Diagnosis Date    Hypercholesteremia     Hypertension     Seizures     Stroke     2015 and 2017, right side    TIA (transient ischemic attack) 2017     Past Surgical History:   Procedure Laterality Date    APPENDECTOMY (OPEN)      CHOLECYSTECTOMY      EGD, BIOPSY N/A 04/16/2017    Procedure: EGD, BIOPSY;  Surgeon: Pershing Proud, MD;  Location: [facility];  Service: Gastroenterology;  Laterality: N/A;    HYSTERECTOMY           ED VS: on RA   Temp:  [97.5 F (36.4 C)-98.6 F (37 C)]   Heart Rate:  [61-103]   Resp Rate:  [16-20]   BP: (108-231)/(68-124)   SpO2:  [90 %-100 %]   Height:  [177.8 cm (5\' 10" )]   Weight:  [148.3 kg (326 lb 15.1 oz)-148.6 kg (327 lb 9.6 oz)]   BMI (calculated):  [47]       ED Labs:   09/14/20 16:20 09/15/20 03:24 09/15/20 03:42   WBC 9.08  9.74 (H)   MPV 12.6 (H)  14.2 (H)   Monocytes Absolute 0.18 (L)     Basophils Absolute Manual 0.18 (H)     Cell Morphology Abnormal  !     Ovalocytes Present !     Glucose 115 (H) 110 (H)    Potassium 3.7 3.4 (L)    Alkaline Phosphatase 136 (H)     Protein Total 8.6 (H)     Globulin 4.5 (H)     HDL  33 (L)    LDL Calculated  122 (H)    (H): Data is abnormally high  (L): Data is abnormally low  !: Data is abnormal    Imaging:    Medication Administration from 09/14/2020 1612 to 09/14/2020 2348     Date/Time Order Dose Route Action Comments    09/14/2020 1839 EDT LORazepam (ATIVAN) injection 1 mg 1 mg Intravenous Given --    09/14/2020 2255 EDT hydrALAZINE (APRESOLINE) injection 10 mg 10 mg Intravenous Given          Assessment and Plan:  Chest pain  Hypertensive urgency  Left facial weakness/right lower extremity weakness, r/o TIA/stroke  History of CVA/TIA  History of seizures  History of migraine headache  Hypertension  Hyperlipidemia  History of asthma     Admit to IMCU  Symptoms of chest pain/TIA could be secondary to uncontrolled hypertension  Restart home blood pressure medications.  Add hydralazine as needed  Troponin panel, lipid panel, hemoglobin A1c  TSH  PPI/GI cocktail  Cardiology consult in the morning  CT head and MRI brain unremarkable.  Neurology consulted  Aspirin, statin  PT/OT  Resume Keppra           Nutrition: regular   DVT/VTE Prophylaxis Lovenox  PT/OT eval    Neuro consult:   Assessment:      44y/o woman with hx of prior CVA (per patient), HTN, HLD, seizures, migraines presenting with wake up deficits of left facial weakness, RLE weakness and right sided sensory deficits (involving face, arm and leg).     CT head imaging reviewed, no acute process noted.     Based on history of multiple risk factors cannot r/o acute stroke but based on atypical nature of her symptoms and repeated prior negative evaluations have lower suspicion for acute stroke. Not a tPA candidate based on LKW >4.5 hours ago.     Patient to be admitted for report of chest pain.         Plan:      --ASA 325mg  x 1 dose in ED now  --repeat MRI likely low yield  but will check as patient being admitted for cardiac workup  --PT/OT evaluation    Scheduled Meds:  Current Facility-Administered Medications   Medication Dose Route Frequency    amLODIPine  5 mg Oral Daily    aspirin  81 mg Oral Daily    aspirin  300 mg Rectal Once    atorvastatin  40 mg Oral QHS    enoxaparin  40 mg Subcutaneous Daily    levETIRAcetam  500 mg Oral Q12H SCH    lisinopril  40 mg Oral Daily    pantoprazole  40 mg Oral QAM AC     Continuous Infusions:  PRN Meds:.albuterol, alum & mag hydroxide-simethicone, Nursing communication: Adult Hypoglycemia Treatment Algorithm **AND** glucagon (rDNA) **AND** dextrose **AND** dextrose **AND** dextrose, hydrALAZINE, lidocaine viscous, magnesium sulfate, melatonin, morphine, naloxone, nitroglycerin, ondansetron **OR** ondansetron, potassium & sodium phosphates, potassium chloride **AND** potassium chloride    No intake or output data in the 24 hours ending 09/15/20 0849     UTILIZATION REVIEW CONTACT: Earlene Plater, UR RN  Clinical case manager- Utilization review  Main UR#: [id number]  Direct #: [phone]  Email: [email or web address]    Facility Tax ID & NPI     Tax ID NPI   [facility] [id number]   Shea Stakes [id number]   [place] [id number]   [place] [id number] - NEW;   [id number] - OLD;   Mackie Pai  [id number] [phone]       NOTES TO REVIEWER:     This clinical review is based on/compiled from documentation provided by the treatment team within the patients medical record.

## 2020-09-16 ENCOUNTER — Other Ambulatory Visit: Payer: Self-pay

## 2020-09-16 LAB — ECG 12-LEAD
Atrial Rate: 66 {beats}/min
IHS MUSE NARRATIVE AND IMPRESSION: NORMAL
P Axis: 30 degrees
P-R Interval: 140 ms
Q-T Interval: 440 ms
QRS Duration: 84 ms
QTC Calculation (Bezet): 461 ms
R Axis: 15 degrees
T Axis: 7 degrees
Ventricular Rate: 66 {beats}/min

## 2020-09-16 MED ORDER — MORPHINE SULFATE 2 MG/ML IJ/IV SOLN (WRAP)
1.0000 mg | Status: AC | PRN
Start: 2020-09-16 — End: 2020-09-17
  Administered 2020-09-16: 1 mg via INTRAVENOUS
  Filled 2020-09-16: qty 1

## 2020-09-16 MED ORDER — MECLIZINE HCL 12.5 MG PO TABS
25.0000 mg | ORAL_TABLET | Freq: Three times a day (TID) | ORAL | Status: DC
Start: 2020-09-16 — End: 2020-09-18
  Administered 2020-09-16 – 2020-09-18 (×6): 25 mg via ORAL
  Filled 2020-09-16 (×6): qty 2

## 2020-09-16 MED ORDER — LORAZEPAM 2 MG/ML IJ SOLN
0.5000 mg | Freq: Once | INTRAMUSCULAR | Status: AC
Start: 2020-09-16 — End: 2020-09-17
  Administered 2020-09-17: 0.5 mg via INTRAVENOUS
  Filled 2020-09-16: qty 1

## 2020-09-16 NOTE — Progress Notes (Signed)
Name of DME company: [employer] ([employer])  (P): [phone]   (F): [phone]  Note:  Bariatric rolling walker delivered to pt's hospital room.

## 2020-09-16 NOTE — Plan of Care (Addendum)
Axox4. Room air. NSR.     PRN pain medication given as ordered. New IV placed by ED.     Needs and questions addressed. Safety measures in place. Appropriate medications administered. Will continue to monitor and report significant changes.       Problem: Safety  Goal: Patient will be free from injury during hospitalization  Outcome: Progressing  Flowsheets (Taken 09/16/2020 2222)  Patient will be free from injury during hospitalization:   Assess patient's risk for falls and implement fall prevention plan of care per policy   Include patient/ family/ care giver in decisions related to safety   Ensure appropriate safety devices are available at the bedside   Use appropriate transfer methods   Hourly rounding   Assess for patients risk for elopement and implement Elopement Risk Plan per policy   Provide alternative method of communication if needed (communication boards, writing)   Provide and maintain safe environment  Goal: Patient will be free from infection during hospitalization  Outcome: Progressing  Flowsheets (Taken 09/16/2020 2222)  Free from Infection during hospitalization:   Assess and monitor for signs and symptoms of infection   Monitor lab/diagnostic results   Monitor all insertion sites (i.e. indwelling lines, tubes, urinary catheters, and drains)   Encourage patient and family to use good hand hygiene technique     Problem: Pain  Goal: Pain at adequate level as identified by patient  Outcome: Progressing  Flowsheets (Taken 09/16/2020 2222)  Pain at adequate level as identified by patient:   Identify patient comfort function goal   Reassess pain within 30-60 minutes of any procedure/intervention, per Pain Assessment, Intervention, Reassessment (AIR) Cycle   Evaluate if patient comfort function goal is met   Evaluate patient's satisfaction with pain management progress     Problem: Moderate/High Fall Risk Score >5  Goal: Patient will remain free of falls  Outcome: Progressing  Flowsheets (Taken 09/16/2020  2200)  Moderate Risk (6-13):   MOD-Perform dangle, stand, walk (DSW) prior to mobilization   MOD-Place bedside commode and assistive devices out of sight when not in use   MOD-Remain with patient during toileting   MOD-Consider a move closer to Nurses Station   MOD-Floor mat at bedside (where available) if appropriate   MOD-Apply bed exit alarm if patient is confused   LOW-Fall Interventions Appropriate for Low Fall Risk     Problem: Every Day - Stroke  Goal: Core/Quality measure requirements - Daily  Outcome: Progressing  Flowsheets (Taken 09/16/2020 2222)  Core/Quality measure requirements - Daily:   VTE Prevention: Ensure anticoagulant(s) administered and/or anti-embolism stockings/devices documented by end of day 2   Ensure antithrombotic administered or contraindication documented by LIP by end of day 2   Once lipid panel has resulted, check LDL. Contact provider for statin order if LDL > 70 (or ensure contraindication documented by LIP).   Continue stroke education (must include Modifiable Risk Factors, Warning Signs and Symptoms of Stroke, Activation of Emergency Medical System and Follow-up Appointments). Ensure handout has been given and documented.  Goal: Neurological status is stable or improving  Outcome: Progressing  Flowsheets (Taken 09/16/2020 2222)  Neurological status is stable or improving:   Monitor/assess/document neurological assessment (Stroke: every 4 hours)   Monitor/assess NIH Stroke Scale   Perform CAM Assessment

## 2020-09-16 NOTE — Progress Notes (Signed)
[facility]  PROGRESS NOTE      Patient: [name] Cook  Date: 09/16/2020   LOS: 0 Days  Admission Date: 09/14/2020   MRN: [id number]  Attending: Maple Mirza, MD  Please contact me on epic chat       ASSESSMENT/PLAN     [name] Cook is a 45 y.o. female admitted with Hypertensive urgency    Interval Summary:     Patient Active Hospital Problem List:    Right sided weakness upper/lower extremities  Difficulty ambulating  Chest pain  Facial droop  Cleared by cardiology  Discussed with neuro  Stroke ruled out however will obtain cervical MRI due to ongoing symptoms  Suspect some functional component however need to rule out underlying pathology within spine  If MRI obtained and normal will discharge today  Would discharge with PT and rolling walker    HTN urgency sbp>230 on admission  Given lisinopril today + amlodipine  Bp now much better controlled    Dizziness  Patient states this continues  Had vomiting yesterday  On meclizine as needed    Morbid obesity BMI 47    H/o seizures, migraine      DVT Prophylaxis: lovenox              SUBJECTIVE     [name] Cook states chest pain, facial droop, right sided weakness continue. No sob fever chills. Has dizziness and nausea.    MEDICATIONS     Current Facility-Administered Medications   Medication Dose Route Frequency    amLODIPine  5 mg Oral Daily    aspirin  81 mg Oral Daily    aspirin  300 mg Rectal Once    atorvastatin  40 mg Oral QHS    enoxaparin  40 mg Subcutaneous Daily    levETIRAcetam  500 mg Oral Q12H SCH    lisinopril  40 mg Oral Daily    LORazepam  0.5 mg Intravenous Once    meclizine  25 mg Oral Q8H    pantoprazole  40 mg Oral QAM AC       PHYSICAL EXAM     Vitals:    09/16/20 1216   BP: 140/81   Pulse: 76   Resp:    Temp:    SpO2:        Temperature: Temp  Min: 97.7 F (36.5 C)  Max: 98.4 F (36.9 C)  Pulse: Pulse  Min: 67  Max: 89  Respiratory: Resp  Min: 18  Max: 19  Non-Invasive BP: BP  Min: 140/81  Max: 169/96  Pulse Oximetry SpO2  Min: 78 %  Max:  100 %    Intake and Output Summary (Last 24 hours) at Date Time    Intake/Output Summary (Last 24 hours) at 09/16/2020 1547  Last data filed at 09/16/2020 0800  Gross per 24 hour   Intake 240 ml   Output --   Net 240 ml       GEN APPEARANCE: Normal;  A&OX3 comfortable NAD  HEENT: PERLA; EOMI; Conjunctiva Clear  NECK: Supple; No bruits  CVS: RRR, S1, S2; No M/G/R  LUNGS: CTAB; No Wheezes; No Rhonchi: No rales  ABD: Soft; No TTP; + Normoactive BS  EXT: No edema; Pulses 2+ and intact  Skin exam:  no pallor  NEURO: right facial droop, keeps mask on, right UE/LE weakness on exam  CAP REFILL:  Normal  MENTAL STATUS:  Normal    Exam done by Maple Mirza, MD on 09/16/20 at 3:47  PM      LABS     Recent Labs   Lab 09/15/20  0342 09/14/20  1620   WBC 9.74* 9.08   RBC 4.99 4.51   Hgb 13.8 12.5   Hematocrit 43.5 39.3   MCV 87.2 87.1   Platelets 143 157       Recent Labs   Lab 09/15/20  0324 09/14/20  1620   Sodium 141 141   Potassium 3.4* 3.7   Chloride 109 109   CO2 24 26   BUN 13.0 11.0   Creatinine 0.9 1.0   Glucose 110* 115*   Calcium 9.0 9.6       Recent Labs   Lab 09/14/20  1620   ALT 25   AST (SGOT) 20   Bilirubin, Total 0.9   Albumin 4.1   Alkaline Phosphatase 136*       Recent Labs   Lab 09/15/20  0324 09/14/20  1620   Troponin I <0.01 <0.01       Recent Labs   Lab 09/14/20  1620   PT INR 1.1   PT 12.6       Microbiology Results (last 15 days)       Procedure Component Value Units Date/Time    COVID-19 (SARS-COV-2) [facility] Rapid) [[id number]] Collected: 09/14/20 1802    Order Status: Completed Specimen: Nasopharyngeal Swab from Nasopharynx Updated: 09/14/20 1832     Purpose of COVID testing Screening     SARS-CoV-2 Specimen Source Nasopharyngeal     SARS CoV-2 Negative     Comment: Test performed using the Abbott ID NOW EUA assay.  Please see Fact Sheets for patients and providers located at:  http://www.rice.biz/    This test is for the qualitative detection of SARS-CoV-2  (COVID19) nucleic acid. Viral  nucleic acids may persist in vivo,  independent of viability. Detection of viral nucleic acid does  not imply the presence of infectious virus, or that virus  nucleic acid is the cause of clinical symptoms. Negative  results should be treated as presumptive and, if inconsistent  with clinical signs and symptoms or necessary for patient  management, should be tested with an alternative molecular  assay. Negative results do not preclude SARS-CoV-2 infection  and should not be used as the sole basis for patient  management decisions. Invalid results may be due to inhibiting  substances in the specimen and recollection should occur.         Narrative:      o Collect and clearly label specimen type:  o Upper respiratory specimen: One Nasopharyngeal Dry Swab NO  Transport Media.  o Hand deliver to laboratory ASAP  Indication for testing->Extended care facility admission to  semi private room  Screening  Rescheduled by [phone] at 09/14/2020 17:59 Reason: .  Rescheduled by [id number] at 09/14/2020 18:00 Reason: .             RADIOLOGY     Upon my review:    Signed,  Maple Mirza, MD  3:47 PM 09/16/2020

## 2020-09-16 NOTE — Progress Notes (Signed)
IV infiltrated,leaking. Pt due for MRI C spine but needs IV ativan for claustrophobia. Charge RN notified to put request in to ICU for new IV. Transport cancelled for now.

## 2020-09-16 NOTE — Plan of Care (Addendum)
A/O x4. RA. NSR. VSS. BP was stable. Pt notes that she has pain in the chest. Pt was given morphine for the pain and this helped. Pt was given potassium oral and IV for 3.4 potassium level. Pt denies SOB. Pt notes that she was dizzy so meclizine was given. Safety bundles in place.     FOUR EYES SKIN ASSESSMENT NOTE    [name] Cook  05/13/1975  [id number]    Braden Scale Score: 20    POC Initiated for Risk for Altered Skin Yes    Patient Assessed for Correct Mattress Surface No    *At risk patients with Braden Score less than 12 must be considered for specialty bed    Mepilex or Adhesive Foam Dressing applied to sacrum/heel if any PI risk factors present: No        If Wound/Pressure Injury present:  Wound/PI assessment documented in LDA (will be shown below): No    Admitting physician notified: No    Wound consult ordered: No        Was skin checked with incoming RN/PCT? Yes    Meliton Rattan, RN  September 16, 2020  8:52 PM    Second RN/PCT Name: Dois Davenport PCT         Wound 10/15/19 Other (Comment) Abdomen Medial;Right open (Active)              Problem: Safety  Goal: Patient will be free from injury during hospitalization  Outcome: Progressing  Flowsheets (Taken 09/15/2020 1116 by Freida Busman, RN)  Patient will be free from injury during hospitalization:   Provide and maintain safe environment   Ensure appropriate safety devices are available at the bedside   Include patient/ family/ care giver in decisions related to safety   Assess for patients risk for elopement and implement Elopement Risk Plan per policy  Goal: Patient will be free from infection during hospitalization  Outcome: Progressing  Flowsheets (Taken 09/16/2020 2042)  Free from Infection during hospitalization:   Assess and monitor for signs and symptoms of infection   Monitor lab/diagnostic results   Monitor all insertion sites (i.e. indwelling lines, tubes, urinary catheters, and drains)   Encourage patient and family to use good hand hygiene technique      Problem: Pain  Goal: Pain at adequate level as identified by patient  Outcome: Progressing  Flowsheets (Taken 09/15/2020 1116 by Freida Busman, RN)  Pain at adequate level as identified by patient:   Reassess pain within 30-60 minutes of any procedure/intervention, per Pain Assessment, Intervention, Reassessment (AIR) Cycle   Evaluate if patient comfort function goal is met   Evaluate patient's satisfaction with pain management progress   Consult/collaborate with Pain Service     Problem: Side Effects from Pain Analgesia  Goal: Patient will experience minimal side effects of analgesic therapy  Outcome: Progressing  Flowsheets (Taken 09/16/2020 2042)  Patient will experience minimal side effects of analgesic therapy:   Monitor/assess patient's respiratory status (RR depth, effort, breath sounds)   Assess for changes in cognitive function   Prevent/manage side effects per LIP orders (i.e. nausea, vomiting, pruritus, constipation, urinary retention, etc.)   Evaluate for opioid-induced sedation with appropriate assessment tool (i.e. POSS)     Problem: Moderate/High Fall Risk Score >5  Goal: Patient will remain free of falls  Outcome: Progressing  Flowsheets (Taken 09/16/2020 1000)  Moderate Risk (6-13):   MOD-Consider activation of bed alarm if appropriate   MOD-Floor mat at bedside (where available) if appropriate   MOD-Remain with  patient during toileting   MOD-Perform dangle, stand, walk (DSW) prior to mobilization   MOD-Request PT/OT consult order for patients with gait/mobility impairment     Problem: Every Day - Stroke  Goal: Core/Quality measure requirements - Daily  Outcome: Progressing  Flowsheets (Taken 09/16/2020 2042)  Core/Quality measure requirements - Daily:   VTE Prevention: Ensure anticoagulant(s) administered and/or anti-embolism stockings/devices documented by end of day 2   Ensure antithrombotic administered or contraindication documented by LIP by end of day 2   Once lipid panel has resulted, check  LDL. Contact provider for statin order if LDL > 70 (or ensure contraindication documented by LIP).   Continue stroke education (must include Modifiable Risk Factors, Warning Signs and Symptoms of Stroke, Activation of Emergency Medical System and Follow-up Appointments). Ensure handout has been given and documented.  Goal: Neurological status is stable or improving  Outcome: Progressing  Flowsheets (Taken 09/15/2020 1116 by Freida Busman, RN)  Neurological status is stable or improving:   Perform CAM Assessment   Observe for seizure activity and initiate seizure precautions if indicated   Re-assess NIH Stroke Scale for any change in status   Monitor/assess NIH Stroke Scale  Goal: Stable vital signs and fluid balance  Outcome: Progressing  Flowsheets (Taken 09/15/2020 1116 by Layla Maw, [place], RN)  Stable vital signs and fluid balance:   Encourage oral fluid intake   Monitor intake and output. Notify LIP if urine output is < 30 mL/hour.   Monitor and assess vitals every 4 hours or as ordered and hemodynamic parameters  Goal: Patient will maintain adequate oxygenation  Outcome: Progressing  Flowsheets (Taken 09/15/2020 1116 by Freida Busman, RN)  Patient will maintain adequate oxygenation:   Maintain SpO2 of greater than 92%   Sleep Apnea: Assess if previously tested or on CPAP   Sleep Apnea: Encourage patient to speak to PCP regarding sleep apnea/sleep study ([place] only)  Goal: Nutritional intake is adequate  Outcome: Progressing  Flowsheets (Taken 09/15/2020 1116 by Layla Maw, [place], RN)  Nutritional intake is adequate:   Encourage/perform oral hygiene as appropriate   Encourage/administer dietary supplements as ordered (i.e. tube feed, TPN, oral, OGT/NGT, supplements)   Include patient/patient care companion in decisions related to nutrition   Assess anorexia, appetite, and amount of meal/food tolerated   Consult/collaborate with Speech Therapy (swallow evaluations)  Goal: Elimination patterns are normal or  improving  Outcome: Progressing  Flowsheets (Taken 09/15/2020 1116 by Layla Maw, [place], RN)  Elimination patterns are normal or improving:   Monitor for abdominal distension   Reinforce education on foods that improve and complicate bowel movements and how activity and medications can affect bowel movements   Consult/collaborate with Clinical Nutritionist   Assess for signs and symptoms of bleeding.  Report signs of bleeding to physician   Administer treatments as ordered  Goal: Mobility/Activity is maintained at optimal level for patient  Outcome: Progressing  Goal: Skin integrity is maintained or improved  Outcome: Progressing  Goal: Neurovascular status is stable or improving  Outcome: Progressing  Flowsheets (Taken 09/15/2020 1116 by Freida Busman, RN)  Neurovascular status is stable or improving:   Monitor/assess neurovascular status (pulses, capillary refill, pain, paresthesia, presence of edema)   Monitor/assess for signs of Venous Thrombus Emboli (edema of calf/thigh redness, pain)   Monitor/assess site of invasive procedure for signs of bleeding  Goal: Effective coping demonstrated  Outcome: Progressing  Flowsheets (Taken 09/15/2020 1116 by Freida Busman, RN)  Effective coping demonstrated:   Offer reassurance to decrease anxiety  Assess/report to LIP uncontrolled anxiety, depression, or ineffective coping  Goal: Will be able to express needs and understand communication  Outcome: Progressing  Flowsheets (Taken 09/15/2020 1116 by Freida Busman, RN)  Able to express needs and understand communication:   Consult/collaborate with Speech Language Pathology (SLP)   Consult/collaborate with Case Management/Social Work   Include patient care companion in decisions related to communication

## 2020-09-16 NOTE — UM Notes (Signed)
Concurrent review: September 16, 2020     MEDICAID HMO/[employer] [place] MEDICAID [employer]  Primary Coverage  Auth Number   NPR     Current VS:   Temp:  [97.9 F (36.6 C)-98.4 F (36.9 C)]   Heart Rate:  [67-81]   Resp Rate:  [18-19]   BP: (140-160)/(81-102)   SpO2:  [85 %-100 %]   Weight:  [144.2 kg (318 lb)]       Medicine progress note:  Right sided weakness upper/lower extremities  Difficulty ambulating  Chest pain  Facial droop  Cleared by cardiology  Discussed with neuro  Stroke ruled out however will obtain cervical MRI due to ongoing symptoms  Suspect some functional component however need to rule out underlying pathology within spine  If MRI obtained and normal will discharge today  Would discharge with PT and rolling walker     HTN urgency sbp>230 on admission  Given lisinopril today + amlodipine  Bp now much better controlled     Dizziness  Patient states this continues  Had vomiting yesterday  On meclizine as needed     Morbid obesity BMI 47     H/o seizures, migraine        DVT Prophylaxis: lovenox      Scheduled Meds:  Current Facility-Administered Medications   Medication Dose Route Frequency    amLODIPine  5 mg Oral Daily    aspirin  81 mg Oral Daily    aspirin  300 mg Rectal Once    atorvastatin  40 mg Oral QHS    enoxaparin  40 mg Subcutaneous Daily    levETIRAcetam  500 mg Oral Q12H SCH    lisinopril  40 mg Oral Daily    LORazepam  0.5 mg Intravenous Once    meclizine  25 mg Oral Q8H    pantoprazole  40 mg Oral QAM AC     Continuous Infusions:  PRN Meds:.albuterol, alum & mag hydroxide-simethicone, Nursing communication: Adult Hypoglycemia Treatment Algorithm **AND** glucagon (rDNA) **AND** dextrose **AND** dextrose **AND** dextrose, hydrALAZINE, hydrALAZINE, lidocaine viscous, magnesium sulfate, melatonin, morphine, naloxone, nitroglycerin, ondansetron **OR** ondansetron, potassium & sodium phosphates, potassium chloride **AND** potassium chloride      Intake/Output Summary (Last 24 hours) at  09/16/2020 1711  Last data filed at 09/16/2020 0800  Gross per 24 hour   Intake 240 ml   Output --   Net 240 ml         UTILIZATION REVIEW CONTACT: Earlene Plater, UR RN  Clinical case manager- Utilization review  Main UR#: [phone]  Direct #: [phone]  Email: [email or web address]    Facility Tax ID & NPI     Tax ID NPI   [facility] [id number]   [facility] Stakes [id number]   [place] [id number]   [place] [id number] - NEW;   [id number] - OLD;   Mackie Pai  [phone]       NOTES TO REVIEWER:     This clinical review is based on/compiled from documentation provided by the treatment team within the patient's medical record.

## 2020-09-17 ENCOUNTER — Observation Stay: Payer: Medicaid Managed Care Other

## 2020-09-17 LAB — TROPONIN I: Troponin I: <0.01 ng/mL (ref 0.00–0.05)

## 2020-09-17 MED ORDER — MORPHINE SULFATE 2 MG/ML IJ/IV SOLN (WRAP)
1.0000 mg | Status: AC | PRN
Start: 2020-09-17 — End: 2020-09-18
  Administered 2020-09-17 – 2020-09-18 (×5): 1 mg via INTRAVENOUS
  Filled 2020-09-17 (×5): qty 1

## 2020-09-17 NOTE — Plan of Care (Addendum)
AxOx4. Room air. NSR.     PRN pain medication given as ordered.     Needs and questions addressed. Updated on plan of care. Appropriate medications administered. Will continue to monitor and report significant changes.        Problem: Safety  Goal: Patient will be free from injury during hospitalization  Outcome: Progressing  Flowsheets (Taken 09/17/2020 2300)  Patient will be free from injury during hospitalization:   Assess patient's risk for falls and implement fall prevention plan of care per policy   Include patient/ family/ care giver in decisions related to safety   Use appropriate transfer methods   Ensure appropriate safety devices are available at the bedside   Hourly rounding   Assess for patients risk for elopement and implement Elopement Risk Plan per policy   Provide alternative method of communication if needed (communication boards, writing)   Provide and maintain safe environment  Goal: Patient will be free from infection during hospitalization  Outcome: Progressing  Flowsheets (Taken 09/17/2020 2300)  Free from Infection during hospitalization:   Assess and monitor for signs and symptoms of infection   Monitor lab/diagnostic results     Problem: Moderate/High Fall Risk Score >5  Goal: Patient will remain free of falls  Outcome: Progressing  Flowsheets (Taken 09/17/2020 1959)  Moderate Risk (6-13):   MOD-Perform dangle, stand, walk (DSW) prior to mobilization   MOD-Remain with patient during toileting   MOD-Floor mat at bedside (where available) if appropriate   MOD-Consider activation of bed alarm if appropriate   LOW-Fall Interventions Appropriate for Low Fall Risk     Problem: Every Day - Stroke  Goal: Core/Quality measure requirements - Daily  Outcome: Progressing  Flowsheets (Taken 09/16/2020 2222)  Core/Quality measure requirements - Daily:   VTE Prevention: Ensure anticoagulant(s) administered and/or anti-embolism stockings/devices documented by end of day 2   Ensure antithrombotic administered or  contraindication documented by LIP by end of day 2   Once lipid panel has resulted, check LDL. Contact provider for statin order if LDL > 70 (or ensure contraindication documented by LIP).   Continue stroke education (must include Modifiable Risk Factors, Warning Signs and Symptoms of Stroke, Activation of Emergency Medical System and Follow-up Appointments). Ensure handout has been given and documented.

## 2020-09-17 NOTE — Plan of Care (Signed)
Patient is alert and oriented x4, on room air, NSR on tele, no distress noted, and c/o chest pain addressed with IV Morphine as ordered. Troponin negative. MRI C spine pending. Safety precautions in place. Education about care plan and medications given, pt verbalzed understandings. Will continue to monitor.     Problem: Safety  Goal: Patient will be free from injury during hospitalization  Outcome: Progressing  Flowsheets (Taken 09/17/2020 1128)  Patient will be free from injury during hospitalization:   Assess patient's risk for falls and implement fall prevention plan of care per policy   Use appropriate transfer methods   Ensure appropriate safety devices are available at the bedside   Hourly rounding   Provide and maintain safe environment  Goal: Patient will be free from infection during hospitalization  Outcome: Progressing  Flowsheets (Taken 09/17/2020 1128)  Free from Infection during hospitalization:   Assess and monitor for signs and symptoms of infection   Monitor lab/diagnostic results   Monitor all insertion sites (i.e. indwelling lines, tubes, urinary catheters, and drains)     Problem: Pain  Goal: Pain at adequate level as identified by patient  Outcome: Progressing  Flowsheets (Taken 09/17/2020 1128)  Pain at adequate level as identified by patient:   Identify patient comfort function goal   Reassess pain within 30-60 minutes of any procedure/intervention, per Pain Assessment, Intervention, Reassessment (AIR) Cycle   Assess pain on admission, during daily assessment and/or before any "as needed" intervention(s)   Evaluate if patient comfort function goal is met     Problem: Discharge Barriers  Goal: Patient will be discharged home or other facility with appropriate resources  Outcome: Progressing  Flowsheets (Taken 09/17/2020 1128)  Discharge to home or other facility with appropriate resources:   Provide appropriate patient education   Provide information on available health resources   Initiate discharge  planning     Problem: Psychosocial and Spiritual Needs  Goal: Demonstrates ability to cope with hospitalization/illness  Outcome: Progressing  Flowsheets (Taken 09/17/2020 1128)  Demonstrates ability to cope with hospitalizations/illness:   Encourage verbalization of feelings/concerns/expectations   Assist patient to identify own strengths and abilities   Encourage patient to set small goals for self   Provide quiet environment

## 2020-09-17 NOTE — Progress Notes (Signed)
[facility]  PROGRESS NOTE      Patient: [name] Cook  Date: 09/17/2020   LOS: 0 Days  Admission Date: 09/14/2020   MRN: [id number]  Attending: Maple Mirza, MD  Please contact me on epic chat       ASSESSMENT/PLAN     [name] Cook is a 45 y.o. female admitted with Hypertensive urgency    Interval Summary:     Patient Active Hospital Problem List:    Right sided weakness upper/lower extremities  Difficulty ambulating  Chest pain  Facial droop  Cleared by cardiology  Discussed with neuro  Stroke ruled out however will obtain cervical MRI due to ongoing symptoms--awaiting reading. Have called radiologist to expedite  Suspect some functional component however need to rule out underlying pathology within spine  If MRI obtained and normal will discharge today  Would discharge with PT and rolling walker    HTN urgency sbp>230 on admission  Under control with lisinopril and amlodipine  Bp now much better controlled    Dizziness  Patient states this continues  Had vomiting   On meclizine as needed    Morbid obesity BMI 47    H/o seizures, migraine      DVT Prophylaxis: lovenox              SUBJECTIVE     [name] Cook states chest pain, facial droop, right sided weakness continue. No sob fever chills. dizziness persists    MEDICATIONS     Current Facility-Administered Medications   Medication Dose Route Frequency    amLODIPine  5 mg Oral Daily    aspirin  81 mg Oral Daily    aspirin  300 mg Rectal Once    atorvastatin  40 mg Oral QHS    enoxaparin  40 mg Subcutaneous Daily    levETIRAcetam  500 mg Oral Q12H SCH    lisinopril  40 mg Oral Daily    meclizine  25 mg Oral Q8H    pantoprazole  40 mg Oral QAM AC       PHYSICAL EXAM     Vitals:    09/17/20 1540   BP: 136/90   Pulse: 82   Resp: 14   Temp: 99.3 F (37.4 C)   SpO2: 100%       Temperature: Temp  Min: 97.9 F (36.6 C)  Max: 99.3 F (37.4 C)  Pulse: Pulse  Min: 69  Max: 87  Respiratory: Resp  Min: 14  Max: 18  Non-Invasive BP: BP  Min: 118/70  Max:  167/97  Pulse Oximetry SpO2  Min: 93 %  Max: 100 %    Intake and Output Summary (Last 24 hours) at Date Time    Intake/Output Summary (Last 24 hours) at 09/17/2020 1828  Last data filed at 09/17/2020 0600  Gross per 24 hour   Intake 40 ml   Output --   Net 40 ml       GEN APPEARANCE: Normal;  A&OX3 comfortable NAD  HEENT: PERLA; EOMI; Conjunctiva Clear  NECK: Supple; No bruits  CVS: RRR, S1, S2; No M/G/R  LUNGS: CTAB; No Wheezes; No Rhonchi: No rales  ABD: Soft; No TTP; + Normoactive BS  EXT: No edema; Pulses 2+ and intact  Skin exam:  no pallor  NEURO: right facial droop, keeps mask on, right UE/LE weakness on exam  CAP REFILL:  Normal  MENTAL STATUS:  Normal    Exam done by Maple Mirza, MD on 09/17/20 at 3:47 PM  LABS     Recent Labs   Lab 09/15/20  0342 09/14/20  1620   WBC 9.74* 9.08   RBC 4.99 4.51   Hgb 13.8 12.5   Hematocrit 43.5 39.3   MCV 87.2 87.1   Platelets 143 157       Recent Labs   Lab 09/15/20  0324 09/14/20  1620   Sodium 141 141   Potassium 3.4* 3.7   Chloride 109 109   CO2 24 26   BUN 13.0 11.0   Creatinine 0.9 1.0   Glucose 110* 115*   Calcium 9.0 9.6       Recent Labs   Lab 09/14/20  1620   ALT 25   AST (SGOT) 20   Bilirubin, Total 0.9   Albumin 4.1   Alkaline Phosphatase 136*       Recent Labs   Lab 09/17/20  0312 09/15/20  0324 09/14/20  1620   Troponin I <0.01 <0.01 <0.01       Recent Labs   Lab 09/14/20  1620   PT INR 1.1   PT 12.6       Microbiology Results (last 15 days)       Procedure Component Value Units Date/Time    COVID-19 (SARS-COV-2) [facility] C[facility] Rapid) [[id number]] Collected: 09/14/20 1802    Order Status: Completed Specimen: Nasopharyngeal Swab from Nasopharynx Updated: 09/14/20 1832     Purpose of COVID testing Screening     SARS-CoV-2 Specimen Source Nasopharyngeal     SARS CoV-2 Negative     Comment: Test performed using the Abbott ID NOW EUA assay.  Please see Fact Sheets for patients and providers located at:  http://www.rice.biz/    This test is for the  qualitative detection of SARS-CoV-2  (COVID19) nucleic acid. Viral nucleic acids may persist in vivo,  independent of viability. Detection of viral nucleic acid does  not imply the presence of infectious virus, or that virus  nucleic acid is the cause of clinical symptoms. Negative  results should be treated as presumptive and, if inconsistent  with clinical signs and symptoms or necessary for patient  management, should be tested with an alternative molecular  assay. Negative results do not preclude SARS-CoV-2 infection  and should not be used as the sole basis for patient  management decisions. Invalid results may be due to inhibiting  substances in the specimen and recollection should occur.         Narrative:      o Collect and clearly label specimen type:  o Upper respiratory specimen: One Nasopharyngeal Dry Swab NO  Transport Media.  o Hand deliver to laboratory ASAP  Indication for testing->Extended care facility admission to  semi private room  Screening  Rescheduled by [phone] at 09/14/2020 17:59 Reason: .  Rescheduled by [id number] at 09/14/2020 18:00 Reason: .             RADIOLOGY     Upon my review:    Signed,  Maple Mirza, MD  6:28 PM 09/17/2020

## 2020-09-18 MED ORDER — MECLIZINE HCL 25 MG PO TABS
25.0000 mg | ORAL_TABLET | Freq: Three times a day (TID) | ORAL | 0 refills | Status: DC | PRN
Start: 2020-09-18 — End: 2021-07-29

## 2020-09-18 MED ORDER — LIDOCAINE 5 % EX PTCH
1.0000 | MEDICATED_PATCH | CUTANEOUS | 0 refills | Status: DC
Start: 2020-09-18 — End: 2021-07-29

## 2020-09-18 NOTE — Progress Notes (Signed)
Pt refused to take her morning meds at scheduled time; she stated that she would take them after breakfast and pain medicine, IV morphine; when pt was told that IV morphine was discontinued and offered another pain medication, she refused to take pain med and would rather get discharged immediately without breakfast; IV out by tech; pt refused to sign the discharge paper; she left without signing discharge paper by the time this writer went to pt's room; charge RN notified; tele box returned

## 2020-09-18 NOTE — Discharge Summary (Signed)
[name] Cook      Patient: [name] Cook  Admission Date: 09/14/2020   DOB: 1975-07-26  Discharge Date: 09/18/2020    MRN: [id number]  Discharge Attending:Savaughn Karwowski Gwenlyn Found, MD     Referring Physician: Letitia Libra, MD  PCP: [name] Libra, MD       DISCHARGE SUMMARY     Discharge Information   Admission Diagnosis:   Hypertensive urgency    Discharge Diagnosis:   Active Hospital Problems    Diagnosis    Right sided weakness    Hypertensive urgency    Facial droop    Chest pain        Admission Condition: fair  Discharge Condition: improved  Consultants: neuro, card  Functional Status: baseline  Discharged to: home    Discharge Medications:     Medication List        START taking these medications      meclizine 25 MG tablet  Commonly known as: ANTIVERT  Take 1 tablet (25 mg total) by mouth 3 (three) times daily as needed for Nausea or Dizziness            CHANGE how you take these medications      atorvastatin 40 MG tablet  Commonly known as: LIPITOR  Take 1 tablet (40 mg total) by mouth daily  What changed:   how much to take  when to take this  Notes to patient: Last dose given: Wednesday, 09/15/20 at 12:30am    Next dose due: This Evening, Wednesday, 09/15/20            CONTINUE taking these medications      albuterol sulfate HFA 108 (90 Base) MCG/ACT inhaler  Commonly known as: PROVENTIL  Inhale 2 puffs into the lungs every 4 (four) hours as needed for Wheezing or Shortness of Breath     amLODIPine 5 MG tablet  Commonly known as: NORVASC  Take 1 tablet (5 mg total) by mouth daily  Notes to patient: Last dose given: Wednesday, 09/15/20 at 9:12am    Next dose due: Tomorrow, Thursday, 09/16/20     aspirin 81 MG chewable tablet  Chew 1 tablet (81 mg total) by mouth daily  Notes to patient: Last dose given: Wednesday, 09/15/20 at 9:12am    Next dose due: Tomorrow, Thursday, 09/16/20     KEPPRA PO  Notes to patient: Last dose given: Wednesday, 09/15/20 at 9:12am    Next dose due: This Evening, Wednesday, 09/15/20      lidocaine 5 %  Commonly known as: LIDODERM  Place 1 patch onto the skin every 24 hours Remove & Discard patch within 12 hours or as directed by MD     lisinopril 40 MG tablet  Commonly known as: ZESTRIL  Take 1 tablet (40 mg total) by mouth daily     multivitamin Tabs  Take 1 tablet by mouth daily     pantoprazole 40 MG tablet  Commonly known as: PROTONIX  Take 1 tablet (40 mg total) by mouth daily  Notes to patient: Last dose given: Wednesday, 09/15/20 at 9:13am    Next dose due: Tomorrow, Thursday, 09/16/20            STOP taking these medications      sodium hypochlorite 0.125 % Soln  Commonly known as: H-CHLOR 12               Where to Get Your Medications        These medications were sent to  [id number] - [place] - [address]  [address], [place] [address]      Phone: [phone]   amLODIPine 5 MG tablet  atorvastatin 40 MG tablet  lidocaine 5 %  lisinopril 40 MG tablet  meclizine 25 MG tablet             Hospital Course   Presentation History   [name] Cook is a 45 y.o. female with a PMHx of hypertension, dyslipidemia, seizures, TIA/stroke who presented with chest pain and left facial weakness/right lower extremity weakness/right-sided sensory deficits  Patient reported she woke up around 7:00 in the morning with symptoms of chest pressure/heaviness nonradiating.  She also noted left-sided facial droop and right lower extremity weakness along with paresthesia in the face, arm and leg.  She went to ER for evaluation.  CT head showed no acute process.  Patient was not a tPA candidate as LKW>4.5 hours prior to presentation to ER  Blood pressure noted to be 190/101 and up to 215/96  Patient was seen by neurology.  Recommended MRI of the brain which was unremarkable  Troponin negative.  EKG no acute ischemic changes  Patient denies headache, trouble speaking, double vision, vertigo, nausea, vomiting, shortness of breath    See HPI for details.    Hospital Course (0 Days)      Right sided weakness  Right facial droop   Chest pressure intermittent since waking up  HTN urgency  Morbid obesity bmi 47  Hypokalemia  H/o seizures, migraine  H/o asthma  Patient has had several admissions for chest pain and stroke symptoms. Seen by neurology, MRI done and negative for stroke. Seen by cardiology, trop/ekg normal, recommends control of blood pressure. Will recommend outpatient cardiology follow up and possible stress test.  BP 231/93 on admission, now 140-150 sbp with home meds. Will continue them and discharge home.  Patient demonstrates right sided weakness. Please note multiple admissions for stroke and chest pain workup. MRI Cspine shows minimal disease.     Procedures/Imaging:   Upon my review: CT Head WO Contrast    Result Date: 09/14/2020  No CT evidence of acute intracranial pathology. [name] Lesches, MD  09/14/2020 4:40 PM    MRI Brain WO Contrast    Result Date: 09/14/2020  No acute intracranial abnormality. 2 punctate foci of T2/FLAIR hyperintensity within the left centrum semiovale are unchanged from the prior study, and can be seen in the setting of chronic microvascular ischemic change or migraine. 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 [name] Maffucci, MD  09/17/2020 9:49 PM    XR Chest AP Portable    Result Date: 09/14/2020  Normal chest radiograph. [name] Seton MD, MD  09/14/2020 11:19 PM         Best Practices   Was the patient admitted with either a CHF Exacerbation or Pneumonia? no     Progress Note/Physical Exam at Discharge     Subjective: trouble walking, constant chest pain, dizziness. No nausea/vomiting fever    Vitals:    09/17/20 1540 09/17/20 1948 09/17/20 2348 09/18/20 0345   BP: 136/90 105/69 (!) 171/113 126/75   Pulse: 82 91 72 79   Resp: 14 18 18 18    Temp: 99.3 F (37.4 C) 99 F (37.2 C) 97.9  F  (36.6 C) 98.4 F (36.9 C)   TempSrc: Oral Oral Oral Oral   SpO2: 100% 93% 98% 97%   Weight:       Height:           General: NAD, AAOx3  HEENT: perrla, eomi, sclera anicteric, OP: Clear, MMM  Neck: supple, FROM, no LAD  Cardiovascular: RRR, no m/r/g  Lungs: CTAB, no w/r/r  Abdomen: soft, +BS, NT/ND, no masses, no g/r  Extremities: no C/C/E  Skin: no rashes or lesions noted  Neuro: shows right facial droop and right UE/RLE weakness on exam       Diagnostics     Labs/Studies Pending at Discharge: no    Last Labs   Recent Labs   Lab 09/15/20  0342 09/14/20  1620   WBC 9.74* 9.08   RBC 4.99 4.51   Hgb 13.8 12.5   Hematocrit 43.5 39.3   MCV 87.2 87.1   Platelets 143 157       Recent Labs   Lab 09/15/20  0324 09/14/20  1620   Sodium 141 141   Potassium 3.4* 3.7   Chloride 109 109   CO2 24 26   BUN 13.0 11.0   Creatinine 0.9 1.0   Glucose 110* 115*   Calcium 9.0 9.6       Microbiology Results (last 15 days)       Procedure Component Value Units Date/Time    COVID-19 (SARS-COV-2) Verne Carrow Rapid) [[id number]] Collected: 09/14/20 1802    Order Status: Completed Specimen: Nasopharyngeal Swab from Nasopharynx Updated: 09/14/20 1832     Purpose of COVID testing Screening     SARS-CoV-2 Specimen Source Nasopharyngeal     SARS CoV-2 Negative     Comment: Test performed using the Abbott ID NOW EUA assay.  Please see Fact Sheets for patients and providers located at:  http://www.rice.biz/    This test is for the qualitative detection of SARS-CoV-2  (COVID19) nucleic acid. Viral nucleic acids may persist in vivo,  independent of viability. Detection of viral nucleic acid does  not imply the presence of infectious virus, or that virus  nucleic acid is the cause of clinical symptoms. Negative  results should be treated as presumptive and, if inconsistent  with clinical signs and symptoms or necessary for patient  management, should be tested with an alternative molecular  assay. Negative results do not preclude  SARS-CoV-2 infection  and should not be used as the sole basis for patient  management decisions. Invalid results may be due to inhibiting  substances in the specimen and recollection should occur.         Narrative:      o Collect and clearly label specimen type:  o Upper respiratory specimen: One Nasopharyngeal Dry Swab NO  Transport Media.  o Hand deliver to laboratory ASAP  Indication for testing->Extended care facility admission to  semi private room  Screening  Rescheduled by [id number] at 09/14/2020 17:59 Reason: .  Rescheduled by [id number] at 09/14/2020 18:00 Reason: .             Patient Instructions   Discharge Diet: reg  Discharge Activity: as tol    Follow Up Appointment:   Follow-up Information       Czapp, Gilford Rile, MD. Schedule an appointment as soon as possible for a visit in 1 week(s).    Specialty: Family Medicine  Contact information:  [address]  [address]  [place] [address]  [phone]  Time spent examining patient, discussing with patient/family regarding hospital course, chart review, reconciling medications and discharge planning: 45 minutes.    Signed,  Maple Mirza, MD    8:05 AM 09/18/2020

## 2020-09-18 NOTE — Discharge Instr - Diet (Signed)
Heart healthy diet

## 2020-09-18 NOTE — Progress Notes (Signed)
Patient discharging home with home health and fww. Husband to transport home.   09/18/20 0815   Discharge Disposition   Patient preference/choice provided? Yes   Physical Discharge Disposition Home, Home Health   Mode of Transportation Car   Patient/Family/POA notified of transfer plan Yes   Outpatient Services   Home Health Skilled Nursing;Home PT/OT/ST   CM Interventions   Referral made for home health RN visit? Yes   Multidisciplinary rounds/family meeting before d/c? Yes   Medicare Checklist   Is this a Medicare patient? No

## 2020-09-18 NOTE — Discharge Instr - Activity (Signed)
As tolerated; no restriction

## 2020-09-28 NOTE — Progress Notes (Signed)
Home Health Referral      Referral from Lynnae Sandhoff (Case Manager) for home health care upon discharge.    By [employer], the patient has the right to freely choose a home care provider.    Arrangements have been made with:    A company of the patients choosing. We have supplied the patient with a listing of providers in your area who asked to be included and participate in Medicare.   [facility], formerly [facility], a home care agency that provides adult home care services and participates in Medicare   The preferred provider of your insurance company. Choosing a home care provider other than your insurance company's preferred provider may affect your insurance coverage.      Home Health Discharge Information    Your doctor has ordered Physical Therapy and Occupational Therapy in-home service(s) for you while you recuperate at home, to assist you in the transition from hospital to home.    The agency that you or your representative chose to provide the service:  Name of Home Health Agency Placement: Other (comment box) [facility])] [phone]      [employer]:  Name of DME Agency: [facility] [place]] [phone] Bariatric Rolling Walker to be delivered to patients room.      The above services were set up by:  Marcheta Grammes, RN [facility] Liaison) Phone [phone]      IF YOU HAVE NOT HEARD FROM YOUR HOME YOUR HOME HEALTH AGENCY WITHIN 24-48 HOURS AFTER DISCHARGE PLEASE CALL YOUR AGENCY TO ARRANGE A TIME FOR YOUR FIRST VISIT. FOR ANY SCHEDULING CONCERNS OR QUESTIONS RELATED TO HOME HEALTH, SUCH AS TIME OR DATE PLEASE CONTACT YOUR HOME HEALTH AGENCY AT THE NUMBER LISTED ABOVE.    Additional comments:    HOME HEALTH REFERRAL    PATIENT DEMOGRAPHICS:        Name: [name] Cook    Discharge Address: [address]   [place] [address]      Primary Telephone Number:  [phone]  Secondary Telephone Number: [phone]  Significant  Other: Glenice Bow  Emergency Contact and Number: Extended Emergency Contact Information  Primary Emergency Contact: Harper,Patrick   [place]  Mobile Phone: [phone]  Relation: Significant Other      Ordering Physician: Maple Mirza, MD     PCP: Larene Beach, MD, [phone]    Following Physician: Larene Beach, MD      Language/Communication Barrier:    No    Primary Diagnosis and Reason for Services:      Facial droop [R29.810]   Right leg weakness [R29.898]   Chest pain [R07.9]      [employer] (Labs, Wounds, Infusions, etc.):      Additional Comments:      COVID-19 Screening:    COVID-19 Negative          Discharge Date: 09/18/20    Referral Source (PACC/Hospital/Unit): Marcheta Grammes, RN    Referral Date: 09/28/20        Home Health face-to-face (FTF) Encounter (Order [id number])  Consult  Date: 09/28/2020 Department: [facility] Ordering/Authorizing: Maple Mirza, MD           Order Information    Order Date/Time Release Date/Time Start Date/Time End Date/Time   09/28/20 11:36 AM None 09/18/20 12:00 AM 09/18/20 12:00 AM       Order Details    Frequency Duration  Priority Order Class   Once 1  occurrence Routine Hospital Performed               Standing Order Information    Remaining Occurrences Interval      1/1 Once                   Provider Information    Ordering User Ordering Provider Authorizing Provider   Marcheta Grammes, RN Tirrell, Fransico Him, MD Tirrell, Fransico Him, MD   Attending Provider(s) Admitting Provider PCP   Elliot Cousin, MD; Carmelina Paddock, MD; Maple Mirza, MD Carmelina Paddock, MD Czapp, Gilford Rile, MD     Verbal Order Info    Action Created on Order Mode Entered by Responsible Provider Signed by Signed on   Ordering 09/28/20 1136 Telephone with Johnn Hai, RN Tirrell, Fransico Him, MD             Comments    PCP: Larene Beach, MD, [phone]     Following Physician: Larene Beach, MD     Bariatric Rolling walker needed >99  months, NPI: _____[id number]     Ht: 1.778 m (5\' 10" )   Wt: 144.2 kg (318 lb)     Facial droop [R29.810]   Right leg weakness [R29.898]   Chest pain [R07.9]     Home PT/OT required for gait and balance training, strengthening, mobility, fall prevention, and ADL training.                Home Health face-to-face (FTF) Encounter: Patient Communication     Not Released  Not seen         Order Questions    Question Answer   Date I saw the patient face-to-face: 09/16/2020   Evidence this patient is homebound because: C.  Decreased endurance, strength, ROM, cadence, safety/judgment during mobility    E.  Requires assistance of another person or assistive device to ambulate > 5 feet    G.  Fall risk due to impaired coordination, gait and decreased balance    L.  Transfer/ambulation requires stand by assist and verbal cues to perform safely    N.  Impaired mobility d/t pain, arthritis, weakness that compromises patient safety   Medical conditions that necessitate Home Health care: B.  Functional impairment due to recent hospitalization/procedure/treatment    C.  Risk for complication/infection/pain requiring follow up and monitoring    E.  Exacerbation of disease requiring follow up monitoring    F.  New diagnosis & treatment requiring follow up monitoring and management   Per clinical findings, following services are medically necessary: PT    OT    DME   Clinical findings that support the need for Physical Therapy. PT will G.  Implement activities to improve stance time, cadence & step length    F.  Perform home safety assessment & develop safe in home exercise program    E.  Educate on functional mobility; bed, chair, sit, stand and transfer activities    D.  Provide services to help restore function, mobility, and releive pain    C.  Educate on weight bearing status, stair/gait training, balance & coordination    A.  Evaluate and treat functional impairment and improve mobility   OT will provide assistance with: Basic  motor function and reasoning abilities    Recovery and maintenance skills    Home program to improve ability to perform ADLs    Strategies to compensate for loss  of function    Restorative program to improve mobility and independence   DME Bariatric Walker                    Process Instructions    Please select Home Care Services medically necessary.     Based on the above findings, I certify that this patient is confined to the home and needs intermittent skilled nursing care, physical therapry and / or speech therapy or continues to need occupational therapy. The patient is under my care, and I have initiated the establishment of the plan of care. This patient will be followed by a physician who will periodically review the plan of care.      Collection Information            Consult Order Info    ID Description Priority Start Date Start Time   [id number] Home Health face-to-face (FTF) Encounter Routine 09/18/2020 12:00 AM   Provider Specialty Referred to   ______________________________________ _____________________________________                         Verbal Order Info    Action Created on Order Mode Entered by Responsible Provider Signed by Signed on   Ordering 09/28/20 1136 Telephone with Johnn Hai, RN Tirrell, Fransico Him, MD             Patient Information    Patient Name   [name] Cook Legal Sex   Female DOB   Apr 09, 1975       Reprint Order Requisition    Home Health face-to-face (FTF) Encounter (Order #[id number]) on 09/28/20       Additional Information    Associated Reports External References   Priority and Order Details [employer]                     [facility]        Patient Name: [name] Cook, [name] Cook     MRN: [id number]      CSN: [id number]         Account Information     Hosp Acct #  [id number] Patient Class  Observation Service  Medicine Accommodation Code  Intermediate Care      Admission Information     Admitting Physician:  Attending Physician: Carmelina Paddock,  MD    Unit  [facility]* L&D Status      Admitting Diagnosis: Facial droop; Right leg weakness; Chest pain Room / Bed  A2504/A2504-A L&D - Last Menstrual Cycle  10/15/2018   Chief Complaint: Chest Pain; Facial Droop     Admit Type:  Admit Date/Time:  Discharge Date/Time: Emergency  09/14/2020 / 1622  09/18/2020 / 1114 Length of Stay: 0 Days    L&D EDD  Estimated Date of Delivery: None noted.      Patient Information              Home Address: [address]  [place] [address] Employer:  Employer Address:       ,     Main Phone: [phone] Employer Phone:     SSN: [id number]       DOB: 18-Jun-1975 (44 yrs)       Sex: Female Primary Care Physician: Letitia Libra, MD   Marital Status: Single Referring Physician:       No ref. provider found   Race: Black or African American  Ethnicity: Not of Hispanic/Latino/S*       Emergency Contacts  Name Home Phone Work Phone Mobile Phone Relationship Estanislado Spire     [phone] Significant*           Guarantor Information     Guarantor Name: LAVEDA, DEMEDEIROS ID: [id number]   Guarantor Relationship to Pt: Self Guarantor Type: Personal/Family   Guarantor DOB:    December 24, 1975       Guarantor Address: [phone] [address]          Guarantor Home Phone: [phone] Guarantor Employer:        Guarantor Work Phone:   Pensions consultant Emp Phone:                     Editor, commissioning     Insurance Name: MEDICAID HMO-[employer]* Subscriber Name: [employer] Address:    [address]  [place], [place] [address] Subscriber DOB: 1975-11-17      Subscriber ID: [id number]   Insurance Phone: [phone] Pt Relationship to Sub:   Self   Insurance ID:         Group Name:   Preauthorization #: NPR   Group #:   Preauthorization Days:        Tourist information centre manager Name: - Statistician Name:     Community education officer Address:       ,   Statistician DOB:        Subscriber ID:     Press photographer:   Pt  Relationship to Sub:       Insurance ID:         Group Name:   Preauthorization #:     Group #:   Preauthorization Days:        [employer] Name: - Subscriber Name:     Community education officer Address:       ,   Statistician DOB:        Subscriber ID:     Press photographer:   Pt Relationship to Sub:       Insurance ID:         Group Name:   Preauthorization #:     Group #:   Preauthorization Days:           09/28/2020 11:37 AM         Signed by: Marcheta Grammes, RN  Date Time: 09/28/20 11:25 AM

## 2021-03-11 ENCOUNTER — Emergency Department: Payer: No Typology Code available for payment source

## 2021-03-11 ENCOUNTER — Inpatient Hospital Stay
Admission: EM | Admit: 2021-03-11 | Discharge: 2021-03-15 | DRG: 199 | Disposition: A | Discharge status: Home / Self Care | Payer: No Typology Code available for payment source | Attending: Internal Medicine | Admitting: Internal Medicine

## 2021-03-11 DIAGNOSIS — N390 Urinary tract infection, site not specified: Secondary | ICD-10-CM | POA: Diagnosis present

## 2021-03-11 DIAGNOSIS — I160 Hypertensive urgency: Principal | ICD-10-CM | POA: Diagnosis present

## 2021-03-11 DIAGNOSIS — Z6841 Body Mass Index (BMI) 40.0 and over, adult: Secondary | ICD-10-CM

## 2021-03-11 DIAGNOSIS — G8191 Hemiplegia, unspecified affecting right dominant side: Secondary | ICD-10-CM | POA: Diagnosis present

## 2021-03-11 DIAGNOSIS — Z90710 Acquired absence of both cervix and uterus: Secondary | ICD-10-CM

## 2021-03-11 DIAGNOSIS — R200 Anesthesia of skin: Secondary | ICD-10-CM | POA: Diagnosis present

## 2021-03-11 DIAGNOSIS — I10 Essential (primary) hypertension: Secondary | ICD-10-CM | POA: Diagnosis present

## 2021-03-11 DIAGNOSIS — E7800 Pure hypercholesterolemia, unspecified: Secondary | ICD-10-CM | POA: Diagnosis present

## 2021-03-11 DIAGNOSIS — Z7982 Long term (current) use of aspirin: Secondary | ICD-10-CM

## 2021-03-11 DIAGNOSIS — Z91041 Radiographic dye allergy status: Secondary | ICD-10-CM

## 2021-03-11 DIAGNOSIS — Z8669 Personal history of other diseases of the nervous system and sense organs: Secondary | ICD-10-CM

## 2021-03-11 DIAGNOSIS — R079 Chest pain, unspecified: Secondary | ICD-10-CM | POA: Diagnosis present

## 2021-03-11 DIAGNOSIS — Z886 Allergy status to analgesic agent status: Secondary | ICD-10-CM

## 2021-03-11 DIAGNOSIS — R202 Paresthesia of skin: Secondary | ICD-10-CM | POA: Diagnosis present

## 2021-03-11 DIAGNOSIS — N3000 Acute cystitis without hematuria: Secondary | ICD-10-CM

## 2021-03-11 DIAGNOSIS — Z20822 Contact with and (suspected) exposure to covid-19: Secondary | ICD-10-CM | POA: Diagnosis present

## 2021-03-11 DIAGNOSIS — R29810 Facial weakness: Secondary | ICD-10-CM | POA: Diagnosis present

## 2021-03-11 DIAGNOSIS — R531 Weakness: Secondary | ICD-10-CM

## 2021-03-11 LAB — CBC AND DIFFERENTIAL
Absolute NRBC: 0 10*3/uL (ref 0.00–0.00)
Basophils Absolute Automated: 0.06 10*3/uL (ref 0.00–0.08)
Basophils Automated: 0.6 %
Eosinophils Absolute Automated: 0.26 10*3/uL (ref 0.00–0.44)
Eosinophils Automated: 2.5 %
Hematocrit: 40.2 % (ref 34.7–43.7)
Hgb: 13.1 g/dL (ref 11.4–14.8)
Immature Granulocytes Absolute: 0.04 10*3/uL (ref 0.00–0.07)
Immature Granulocytes: 0.4 %
Lymphocytes Absolute Automated: 2.42 10*3/uL (ref 0.42–3.22)
Lymphocytes Automated: 22.9 %
MCH: 30.1 pg (ref 25.1–33.5)
MCHC: 32.6 g/dL (ref 31.5–35.8)
MCV: 92.4 fL (ref 78.0–96.0)
MPV: 12.9 fL — ABNORMAL HIGH (ref 8.9–12.5)
Monocytes Absolute Automated: 0.74 10*3/uL (ref 0.21–0.85)
Monocytes: 7 %
Neutrophils Absolute: 7.03 10*3/uL — ABNORMAL HIGH (ref 1.10–6.33)
Neutrophils: 66.6 %
Nucleated RBC: 0 /100 WBC (ref 0.0–0.0)
Platelets: 165 10*3/uL (ref 142–346)
RBC: 4.35 10*6/uL (ref 3.90–5.10)
RDW: 14 % (ref 11–15)
WBC: 10.55 10*3/uL — ABNORMAL HIGH (ref 3.10–9.50)

## 2021-03-11 LAB — COMPREHENSIVE METABOLIC PANEL
ALT: 25 U/L (ref 0–55)
AST (SGOT): 21 U/L (ref 5–41)
Albumin/Globulin Ratio: 0.9 (ref 0.9–2.2)
Albumin: 4 g/dL (ref 3.5–5.0)
Alkaline Phosphatase: 124 U/L — ABNORMAL HIGH (ref 37–117)
Anion Gap: 11 (ref 5.0–15.0)
BUN: 13 mg/dL (ref 7.0–21.0)
Bilirubin, Total: 0.8 mg/dL (ref 0.2–1.2)
CO2: 26 mEq/L (ref 17–29)
Calcium: 9.3 mg/dL (ref 8.5–10.5)
Chloride: 106 mEq/L (ref 99–111)
Creatinine: 0.8 mg/dL (ref 0.4–1.0)
Globulin: 4.3 g/dL — ABNORMAL HIGH (ref 2.0–3.6)
Glucose: 86 mg/dL (ref 70–100)
Potassium: 3.8 mEq/L (ref 3.5–5.3)
Protein, Total: 8.3 g/dL (ref 6.0–8.3)
Sodium: 143 mEq/L (ref 135–145)

## 2021-03-11 LAB — GFR: EGFR: >60

## 2021-03-11 LAB — HIGH SENSITIVITY TROPONIN-I: hs Troponin-I: 4 ng/L

## 2021-03-11 MED ORDER — DIAZEPAM 5 MG/ML IJ SOLN
5.0000 mg | Freq: Once | INTRAMUSCULAR | Status: AC
Start: 2021-03-11 — End: 2021-03-12
  Administered 2021-03-12: 01:00:00 5 mg via INTRAVENOUS
  Filled 2021-03-11: qty 2

## 2021-03-11 MED ORDER — LORAZEPAM 2 MG/ML IJ SOLN
1.0000 mg | Freq: Once | INTRAMUSCULAR | Status: AC
Start: 2021-03-11 — End: 2021-03-11
  Administered 2021-03-11: 22:00:00 1 mg via INTRAVENOUS
  Filled 2021-03-11: qty 1

## 2021-03-11 NOTE — ED Provider Notes (Signed)
EMERGENCY DEPARTMENT HISTORY AND PHYSICAL EXAM     None        Date: 03/11/2021  Patient Name: [name] Cook    History of Presenting Illness     Chief Complaint   Patient presents with    Chest Pain    Facial Droop         Additional History: [name] Cook is a 45 y.o. female presenting to the ED with right-sided facial droop, numbness and weakness that started earlier today.  Last known well at 8 AM.  Patient states when she went to the restroom she felt normal.  Patient went back to bed and when she woke up at 11 AM she noticed a facial droop.  Called her doctor who recommended she go to the emergency room.  Patient states she decided to wait it out but when she started to develop chest pain and nausea and vomiting and numbness and weakness on the right side she change her mind came to the emergency room.  History of TIA and stroke.  History of hypertension, compliant with medications      PCP: Czapp, Gilford Rile, MD  SPECIALISTS:    No current facility-administered medications for this encounter.     Current Outpatient Medications   Medication Sig Dispense Refill    albuterol sulfate HFA (PROVENTIL) 108 (90 Base) MCG/ACT inhaler Inhale 2 puffs into the lungs every 4 (four) hours as needed for Wheezing or Shortness of Breath 1 each 0    amLODIPine (NORVASC) 5 MG tablet Take 1 tablet (5 mg total) by mouth daily 30 tablet 0    aspirin 81 MG chewable tablet Chew 1 tablet (81 mg total) by mouth daily 30 tablet 0    atorvastatin (LIPITOR) 40 MG tablet Take 1 tablet (40 mg total) by mouth daily 30 tablet 0    LevETIRAcetam (KEPPRA PO) Take 500 mg by mouth 2 (two) times daily.         lidocaine (LIDODERM) 5 % Place 1 patch onto the skin every 24 hours Remove & Discard patch within 12 hours or as directed by MD 30 patch 0    lisinopril (ZESTRIL) 40 MG tablet Take 1 tablet (40 mg total) by mouth daily 30 tablet 0    meclizine (ANTIVERT) 25 MG tablet Take 1 tablet (25 mg total) by mouth 3 (three) times daily as needed for  Nausea or Dizziness 30 tablet 0    multivitamin (MULTIVITAMIN) Tab Take 1 tablet by mouth daily  0    pantoprazole (PROTONIX) 40 MG tablet Take 1 tablet (40 mg total) by mouth daily 30 tablet 0       Past History     Past Medical History:  Past Medical History:   Diagnosis Date    Hypercholesteremia     Hypertension     Seizures     Stroke     2015 and 2017, right side    TIA (transient ischemic attack) 2017       Past Surgical History:  Past Surgical History:   Procedure Laterality Date    APPENDECTOMY (OPEN)      CHOLECYSTECTOMY      EGD, BIOPSY N/A 04/16/2017    Procedure: EGD, BIOPSY;  Surgeon: Pershing Proud, MD;  Location: [facility];  Service: Gastroenterology;  Laterality: N/A;    HYSTERECTOMY         Family History:  Family History   Problem Relation Age of Onset    Myocardial Infarction Father 32  Deep vein thrombosis Father     No known problems Mother        Social History:  Social History     Tobacco Use    Smoking status: Never    Smokeless tobacco: Never   Vaping Use    Vaping Use: Never used   Substance Use Topics    Alcohol use: No    Drug use: No       Allergies:  Allergies   Allergen Reactions    Contrast [Iodinated Contrast Media] Anaphylaxis     Patient woke up in ICU after having CT with contrast, last remembers being in CT    Fioricet [Butalbital-Apap-Caffeine] Hives    Bentyl [Dicyclomine] Edema    Fentanyl     Iodine     Motrin [Ibuprofen] Swelling    Percocet [Oxycodone-Acetaminophen]     Shellfish-Derived Products     Toradol [Ketorolac Tromethamine]     Tramadol     Tylenol [Acetaminophen] Hives       Review of Systems     Review of Systems   Constitutional:  Negative for activity change, chills and fever.   HENT:  Negative for congestion and sore throat.    Eyes:  Negative for pain and redness.   Respiratory:  Negative for cough and shortness of breath.    Cardiovascular:  Positive for chest pain. Negative for palpitations.   Gastrointestinal:  Positive for nausea and vomiting.  Negative for abdominal pain and diarrhea.   Genitourinary:  Negative for dysuria and hematuria.   Musculoskeletal:  Negative for arthralgias and myalgias.   Skin:  Negative for pallor and rash.   Neurological:  Positive for facial asymmetry, weakness and numbness. Negative for light-headedness and headaches.   Psychiatric/Behavioral:  Negative for suicidal ideas. The patient is not nervous/anxious.    All other systems reviewed and are negative.    Physical Exam   BP 141/78   Pulse 69   Temp 97.9 F (36.6 C) (Oral)   Resp 18   Ht 5\' 10"  (1.778 m)   Wt 136.5 kg   LMP 10/15/2018 (Exact Date)   SpO2 95%   BMI 43.19 kg/m     Physical Exam  Vitals and nursing note reviewed.   Constitutional:       Appearance: She is well-developed.   HENT:      Head: Normocephalic and atraumatic.   Eyes:      Pupils: Pupils are equal, round, and reactive to light.   Cardiovascular:      Rate and Rhythm: Normal rate and regular rhythm.      Heart sounds: Normal heart sounds.   Pulmonary:      Effort: Pulmonary effort is normal.      Breath sounds: Normal breath sounds.   Abdominal:      General: Bowel sounds are normal.      Palpations: Abdomen is soft.      Tenderness: There is no abdominal tenderness. There is no rebound.   Musculoskeletal:         General: No deformity. Normal range of motion.      Cervical back: Normal range of motion and neck supple.   Skin:     General: Skin is warm and dry.      Capillary Refill: Capillary refill takes less than 2 seconds.   Neurological:      Mental Status: She is alert and oriented to person, place, and time.      GCS: GCS eye subscore  is 4. GCS verbal subscore is 5. GCS motor subscore is 6.      Cranial Nerves: Dysarthria and facial asymmetry present.      Sensory: Sensory deficit (right sided dullness) present.      Motor: Weakness (right sided) present.      Coordination: Coordination is intact. Finger-Nose-Finger Test normal.   Psychiatric:         Behavior: Behavior normal.        Diagnostic Study Results     Labs -     Results       Procedure Component Value Units Date/Time    COVID-19 (SARS-CoV-2) only ([facility]) asymptomatic admission - Hospitals [[id number]] Collected: 03/12/21 0551    Specimen: Nasopharyngeal Updated: 03/12/21 0619     Purpose of COVID testing Screening     SARS-CoV-2 Specimen Source Nasal Swab     SARS CoV 2 Overall Result Not Detected    Narrative:      o Collect and clearly label specimen type:  o PREFERRED-Upper respiratory specimen: One Nasal Swab in  [employer].  o Hand deliver to laboratory ASAP  Indication for testing->Extended care facility admission to  semi private room  Screening    High Sensitivity Troponin-I at 2 hrs with calculated Delta [[id number]] Collected: 03/11/21 2348    Specimen: Blood Updated: 03/12/21 0027     hs Troponin-I 4.1 ng/L      hs Troponin-I Delta 0.1 ng/L     High Sensitivity Troponin-I at 0 hrs [[id number]] Collected: 03/11/21 2101    Specimen: Blood Updated: 03/11/21 2146     hs Troponin-I 4.0 ng/L     GFR [[id number]] Collected: 03/11/21 2101     Updated: 03/11/21 2143     EGFR >60.0    Comprehensive metabolic panel [[id number]]  (Abnormal) Collected: 03/11/21 2101    Specimen: Blood Updated: 03/11/21 2143     Glucose 86 mg/dL      BUN 24.4 mg/dL      Creatinine 0.8 mg/dL      Sodium 010 mEq/L      Potassium 3.8 mEq/L      Chloride 106 mEq/L      CO2 26 mEq/L      Calcium 9.3 mg/dL      Protein, Total 8.3 g/dL      Albumin 4.0 g/dL      AST (SGOT) 21 U/L      ALT 25 U/L      Alkaline Phosphatase 124 U/L      Bilirubin, Total 0.8 mg/dL      Globulin 4.3 g/dL      Albumin/Globulin Ratio 0.9     Anion Gap 11.0    CBC and differential [[id number]]  (Abnormal) Collected: 03/11/21 2101    Specimen: Blood Updated: 03/11/21 2109     WBC 10.55 x10 3/uL      Hgb 13.1 g/dL      Hematocrit 03.4 %      Platelets 165 x10 3/uL      RBC 4.35 x10 6/uL      MCV 92.4 fL      MCH 30.1 pg      MCHC 32.6 g/dL      RDW 14 %      MPV 12.9 fL       Neutrophils 66.6 %      Lymphocytes Automated 22.9 %      Monocytes 7.0 %      Eosinophils Automated 2.5 %  Basophils Automated 0.6 %      Immature Granulocytes 0.4 %      Nucleated RBC 0.0 /100 WBC      Neutrophils Absolute 7.03 x10 3/uL      Lymphocytes Absolute Automated 2.42 x10 3/uL      Monocytes Absolute Automated 0.74 x10 3/uL      Eosinophils Absolute Automated 0.26 x10 3/uL      Basophils Absolute Automated 0.06 x10 3/uL      Immature Granulocytes Absolute 0.04 x10 3/uL      Absolute NRBC 0.00 x10 3/uL             Radiologic Studies -   Radiology Results (24 Hour)       Procedure Component Value Units Date/Time    MRA Neck WO Contrast [[id number]] Collected: 03/12/21 0252    Order Status: Completed Updated: 03/12/21 0259    Narrative:      Clinical History:    Neuro deficit, acute, stroke suspected    Technique:    MR ANGIOGRAM NECK WO CONTRAST MRA of the neck was performed without  contrast as per departmental protocol. 3-D MIP reformatted images and  source images were submitted for review.    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 There is a normal configuration of  the great vessels arising from the aorta. The common carotid arteries  are unremarkable. The carotid bifurcation is free of flow-limiting  stenosis bilaterally. The internal carotid arteries are unremarkable.  The vertebral arteries are codominant and normal in course and caliber  bilaterally.      Impression:          No carotid or vertebral artery stenosis, dissection or occlusion within  the neck.    Stable study.    Alric Seton MD, MD   03/12/2021 2:57 AM    MRI Brain WO Contrast [[id number]] Collected: 03/12/21 0249    Order Status: Completed Updated: 03/12/21 0254    Narrative:      Clinical History:    right sided weakness    Technique:    MRI BRAIN WO CONTRAST MRI of the brain was performed without contrast as  per departmental protocol. Multiplanar reformatted images  were submitted  for review.    Comparison:    MRI of the brain dated 09/14/2020. CT scan of the brain dated  03/11/2021.    Findings:    The ventricles and sulci are normal. There is no intra or extra-axial  fluid collection, midline shift or mass effect. Again noted are 2  punctate foci of T2/FLAIR hyperintensity within the left centrum  semiovale. There are no areas of restricted diffusion. The basal  cisterns are patent. The regions of the sella, pineal gland, and  craniocervical junction are unremarkable. Flow-voids are identified  within the vessels of the skull base. The globes and orbits are  unremarkable. There is trace ethmoid sinus mucosal thickening. The  mastoid air cells are clear. The scalp and calvarium are unremarkable.      Impression:          No acute intracranial abnormality.    2 punctate foci of T2/FLAIR hyperintensity within the left centrum  semiovale are again identified, and may be secondary to chronic  microvascular ischemic change for migraine.    No significant interval change.    Alric Seton MD, MD   03/12/2021 2:52 AM    MRA  Head (intracranial) Without Contrast [[id number]] Collected: 03/12/21 0244    Order Status: Completed Updated: 03/12/21 0251    Narrative:      Clinical History:    Neuro deficit, acute, stroke suspected    Technique:    MR ANGIOGRAM HEAD WO CONTRAST 3-D time-of-flight MRA of the brain was  performed without contrast as per departmental protocol. 3-D MIP and  source images were submitted for review.    Comparison:    MRA of the brain dated 10/30/2018    Findings:    The internal carotid arteries, middle cerebral arteries, and anterior  cerebral arteries are unremarkable. The anterior communicating artery is  unremarkable. The intracranial segments of the vertebral arteries, right  posterior inferior cerebellar arteries, basilar artery, superior  cerebellar arteries and posterior cerebral arteries are unremarkable.  The posterior communicating arteries are  unremarkable. There is no  evidence of an aneurysm, significant stenosis or major branch occlusion.      Impression:          No evidence of an aneurysm, significant stenosis or major branch  occlusion.    Alric Seton MD, MD   03/12/2021 2:49 AM    Chest AP Portable [[id number]] Collected: 03/11/21 2053    Order Status: Completed Updated: 03/11/21 2056    Narrative:      CLINICAL INDICATION: Chest Pain    EXAM: XR CHEST AP PORTABLE    COMPARISON: 09/14/2020    FINDINGS:  Heart size is normal. No focal consolidation, pneumothorax, or pleural  effusion is seen. No acute osseous abnormality is noted.      Impression:        1.  No acute findings.    Ewing Schlein MD, MD   03/11/2021 8:53 PM    CT Head without Contrast [[id number]] Collected: 03/11/21 2029    Order Status: Completed Updated: 03/11/21 2038    Narrative:      CLINICAL INDICATION: Neuro deficit, acute, stroke suspected    TECHNIQUE: Axial CT of the head was performed without contrast.     A combination of automatic exposure control, adjustment of the MA and/or  KV according to patient size and/or use of iterative reconstructive  technique was utilized.    COMPARISON: 09/14/2020    FINDINGS:  No extra-axial collection/hemorrhage, mass, mass effect, or midline  shift is noted. The ventricles and sulci are normal for age. No evidence  of acute transcortical infarct.     The orbits are normal. No focal soft tissue abnormalities identified.  The visualized paranasal sinuses and mastoid air cells are normal. No  acute osseous abnormality is identified.      Impression:        1.  No acute findings.    Ewing Schlein MD, MD   03/11/2021 8:35 PM        .    Medical Decision Making   I am the first provider for this patient.    I reviewed the vital signs, available nursing notes, past medical history, past surgical history, family history and social history.    Vital Signs-Reviewed the patient's vital signs.   Patient Vitals for the past 12 hrs:   BP Temp Pulse Resp    03/12/21 0632 141/78 -- 69 18   03/12/21 0454 178/84 -- 68 18   03/12/21 0401 147/68 -- 78 16   03/12/21 0254 128/65 -- 64 --   03/12/21 0209 140/65 -- 85 --   03/12/21 0158 152/70 -- 79 18  03/12/21 0001 194/88 -- 83 16   03/11/21 2200 175/79 -- 71 --   03/11/21 2105 (!) 168/93 -- 62 --   03/11/21 1945 (!) 197/114 97.9 F (36.6 C) 69 21       Pulse Oximetry Analysis -97% on room air, normal    Cardiac Monitor: Normal sinus rhythm, rate 60    EKG:  Interpreted by the EP.   Time Interpreted 2056   Rate: 61   Rhythm: Normal Sinus Rhythm    Interpretation: Normal axis, normal intervals, no significant ST elevation or depression    Procedures:    Clinical Decision Support:   NIH Stroke Score      Flowsheet Row Most Recent Value   Patient's calculated Stroke Score: 6 filed at 03/11/2021 2129                    Provider Notes: Patient with right-sided deficits since earlier today, concerning for stroke.  Do not suspect dissection.  Will obtain CT, labs, x-ray and reassess  ED Course as of 03/12/21 0734   Fri Mar 11, 2021   2128 D/w Dr. Harrel Lemon) - stat MRI [MA]      ED Course User Index  [MA] Quill Grinder, Rochel Brome, MD       Critical Care Time: CRITICAL CARE: The high probability of sudden, clinically significant deterioration in the patient's condition required the highest level of my preparedness to intervene urgently.    The services I provided to this patient were to treat and/or prevent clinically significant deterioration that could result in: death  Services included the following: chart data review, reviewing nursing notes and/or old charts, documentation time, consultant collaboration regarding findings and treatment options, medication orders and management, direct patient care, re-evaluations, vital sign assessments and ordering, interpreting and reviewing diagnostic studies/lab tests.    Aggregate critical care time was , which includes only time during which I was engaged in work directly related to  the patient's care, as described above, whether at the bedside or elsewhere in the Emergency Department.  It did not include time spent performing other reported procedures or the services of residents, students, nurses or physician assistants.       Diagnosis     Clinical Impression:   1. Right sided weakness    2. Facial droop    3. Sensory loss    4. Chest pain, unspecified type        Treatment Plan:   ED Disposition       ED Disposition   Admit    Condition   --    Date/Time   Sat Mar 12, 2021 0550    Comment   Admitting Physician: Georgette Dover [[id number]]   Service:: Medicine [[id number]]   Estimated Length of Stay: > or = to 2 midnights   Tentative Discharge Plan?: Home or Self Care [1]   Does patient need telemetry?: Yes   Telemetry type (separate Telemetry order is also required):: Cardiac telemetry                      Aadin Gaut, Rochel Brome, MD  03/12/21 [phone]

## 2021-03-11 NOTE — ED Triage Notes (Signed)
[name] Cook is a 45 y.o. female present to the ER c/o chest pain and facial drooping onset this afternoon, pt states she notice it around 14:00, reports tingling to her R arm. BP (!) 197/114   Pulse 69   Temp 97.9 F (36.6 C) (Oral)   Resp 21   Ht 5\' 10"  (1.778 m)   Wt 136.5 kg   LMP 10/15/2018 (Exact Date)   SpO2 100%   BMI 43.19 kg/m

## 2021-03-12 ENCOUNTER — Encounter: Payer: Self-pay | Admitting: Internal Medicine

## 2021-03-12 DIAGNOSIS — N3000 Acute cystitis without hematuria: Secondary | ICD-10-CM | POA: Insufficient documentation

## 2021-03-12 DIAGNOSIS — N390 Urinary tract infection, site not specified: Secondary | ICD-10-CM

## 2021-03-12 LAB — HIGH SENSITIVITY TROPONIN-I WITH DELTA
hs Troponin-I Delta: 0.1 ng/L
hs Troponin-I: 3.8 ng/L
hs Troponin-I: 4.1 ng/L

## 2021-03-12 LAB — ECG 12-LEAD
Atrial Rate: 61 {beats}/min
IHS MUSE NARRATIVE AND IMPRESSION: NORMAL
P Axis: 21 degrees
P-R Interval: 136 ms
Q-T Interval: 452 ms
QRS Duration: 90 ms
QTC Calculation (Bezet): 455 ms
R Axis: 0 degrees
T Axis: -14 degrees
Ventricular Rate: 61 {beats}/min

## 2021-03-12 LAB — COVID-19 (SARS-COV-2): SARS CoV 2 Overall Result: NOT DETECTED

## 2021-03-12 MED ORDER — LABETALOL HCL 100 MG PO TABS
200.0000 mg | ORAL_TABLET | Freq: Two times a day (BID) | ORAL | Status: DC
Start: 2021-03-12 — End: 2021-03-13
  Administered 2021-03-13: 200 mg via ORAL
  Filled 2021-03-12 (×2): qty 2

## 2021-03-12 MED ORDER — POTASSIUM CHLORIDE 10 MEQ/100ML IV SOLN
10.0000 meq | INTRAVENOUS | Status: DC | PRN
Start: 2021-03-12 — End: 2021-03-14

## 2021-03-12 MED ORDER — ATORVASTATIN CALCIUM 40 MG PO TABS
40.0000 mg | ORAL_TABLET | Freq: Every day | ORAL | Status: DC
Start: 2021-03-12 — End: 2021-03-15
  Administered 2021-03-12 – 2021-03-15 (×4): 40 mg via ORAL
  Filled 2021-03-12 (×4): qty 1

## 2021-03-12 MED ORDER — POTASSIUM & SODIUM PHOSPHATES 280-160-250 MG PO PACK
2.0000 | PACK | ORAL | Status: DC | PRN
Start: 2021-03-12 — End: 2021-03-14

## 2021-03-12 MED ORDER — SODIUM CHLORIDE 0.9 % IV MBP
1.0000 g | INTRAVENOUS | Status: DC
Start: 2021-03-12 — End: 2021-03-12
  Filled 2021-03-12: qty 1000

## 2021-03-12 MED ORDER — MAGNESIUM SULFATE IN D5W 1-5 GM/100ML-% IV SOLN
1.0000 g | INTRAVENOUS | Status: DC | PRN
Start: 2021-03-12 — End: 2021-03-15

## 2021-03-12 MED ORDER — LIDOCAINE 5 % EX PTCH
1.0000 | MEDICATED_PATCH | CUTANEOUS | Status: DC
Start: 2021-03-12 — End: 2021-03-15
  Administered 2021-03-12: 1 via TRANSDERMAL
  Filled 2021-03-12 (×4): qty 1

## 2021-03-12 MED ORDER — ONDANSETRON HCL 4 MG/2ML IJ SOLN
4.0000 mg | Freq: Four times a day (QID) | INTRAMUSCULAR | Status: DC | PRN
Start: 2021-03-12 — End: 2021-03-15

## 2021-03-12 MED ORDER — MELATONIN 3 MG PO TABS
3.0000 mg | ORAL_TABLET | Freq: Every evening | ORAL | Status: DC | PRN
Start: 2021-03-12 — End: 2021-03-15

## 2021-03-12 MED ORDER — ONDANSETRON 4 MG PO TBDP
4.0000 mg | ORAL_TABLET | Freq: Four times a day (QID) | ORAL | Status: DC | PRN
Start: 2021-03-12 — End: 2021-03-15

## 2021-03-12 MED ORDER — FAMOTIDINE 20 MG PO TABS
20.0000 mg | ORAL_TABLET | Freq: Two times a day (BID) | ORAL | Status: DC | PRN
Start: 2021-03-12 — End: 2021-03-15
  Administered 2021-03-12: 20 mg via ORAL
  Filled 2021-03-12: qty 1

## 2021-03-12 MED ORDER — TAB-A-VITE/BETA CAROTENE PO TABS
1.0000 | ORAL_TABLET | Freq: Every day | ORAL | Status: DC
Start: 2021-03-12 — End: 2021-03-15
  Administered 2021-03-12 – 2021-03-15 (×4): 1 via ORAL
  Filled 2021-03-12 (×4): qty 1

## 2021-03-12 MED ORDER — MECLIZINE HCL 12.5 MG PO TABS
25.0000 mg | ORAL_TABLET | Freq: Three times a day (TID) | ORAL | Status: DC | PRN
Start: 2021-03-12 — End: 2021-03-15

## 2021-03-12 MED ORDER — GLUCAGON 1 MG IJ SOLR (WRAP)
1.0000 mg | INTRAMUSCULAR | Status: DC | PRN
Start: 2021-03-12 — End: 2021-03-15

## 2021-03-12 MED ORDER — PANTOPRAZOLE SODIUM 40 MG PO TBEC
40.0000 mg | DELAYED_RELEASE_TABLET | Freq: Every morning | ORAL | Status: DC
Start: 2021-03-12 — End: 2021-03-15
  Administered 2021-03-12 – 2021-03-15 (×4): 40 mg via ORAL
  Filled 2021-03-12 (×4): qty 1

## 2021-03-12 MED ORDER — HYDRALAZINE HCL 25 MG PO TABS
25.0000 mg | ORAL_TABLET | Freq: Three times a day (TID) | ORAL | Status: DC
Start: 2021-03-12 — End: 2021-03-15
  Administered 2021-03-12 – 2021-03-15 (×8): 25 mg via ORAL
  Filled 2021-03-12 (×10): qty 1

## 2021-03-12 MED ORDER — DEXTROSE 10 % IV BOLUS
12.5000 g | INTRAVENOUS | Status: DC | PRN
Start: 2021-03-12 — End: 2021-03-15

## 2021-03-12 MED ORDER — POTASSIUM CHLORIDE CRYS ER 20 MEQ PO TBCR
0.0000 meq | EXTENDED_RELEASE_TABLET | ORAL | Status: DC | PRN
Start: 2021-03-12 — End: 2021-03-14

## 2021-03-12 MED ORDER — ASPIRIN 81 MG PO CHEW
81.0000 mg | CHEWABLE_TABLET | Freq: Every day | ORAL | Status: DC
Start: 2021-03-12 — End: 2021-03-15
  Administered 2021-03-12 – 2021-03-15 (×4): 81 mg via ORAL
  Filled 2021-03-12 (×4): qty 1

## 2021-03-12 MED ORDER — LIDOCAINE VISCOUS HCL 2 % MT SOLN
10.0000 mL | Freq: Two times a day (BID) | OROMUCOSAL | Status: DC | PRN
Start: 2021-03-12 — End: 2021-03-15
  Filled 2021-03-12 (×2): qty 15

## 2021-03-12 MED ORDER — NALOXONE HCL 0.4 MG/ML IJ SOLN (WRAP)
0.2000 mg | INTRAMUSCULAR | Status: DC | PRN
Start: 2021-03-12 — End: 2021-03-15

## 2021-03-12 MED ORDER — ACETAMINOPHEN 325 MG PO TABS
325.0000 mg | ORAL_TABLET | ORAL | Status: DC | PRN
Start: 2021-03-12 — End: 2021-03-12

## 2021-03-12 MED ORDER — ALUM & MAG HYDROXIDE-SIMETH 200-200-20 MG/5ML PO SUSP
30.0000 mL | Freq: Once | ORAL | Status: AC
Start: 2021-03-12 — End: 2021-03-12
  Administered 2021-03-12: 30 mL via ORAL
  Filled 2021-03-12: qty 30

## 2021-03-12 MED ORDER — LORAZEPAM 2 MG/ML IJ SOLN
1.0000 mg | Freq: Three times a day (TID) | INTRAMUSCULAR | Status: DC | PRN
Start: 2021-03-12 — End: 2021-03-12

## 2021-03-12 MED ORDER — MORPHINE SULFATE 2 MG/ML IJ/IV SOLN (WRAP)
2.0000 mg | Status: AC | PRN
Start: 2021-03-12 — End: 2021-03-13
  Administered 2021-03-12 – 2021-03-13 (×4): 2 mg via INTRAVENOUS
  Filled 2021-03-12 (×4): qty 1

## 2021-03-12 MED ORDER — LORAZEPAM 2 MG/ML IJ SOLN
1.0000 mg | Freq: Three times a day (TID) | INTRAMUSCULAR | Status: DC | PRN
Start: 2021-03-12 — End: 2021-03-13
  Administered 2021-03-12 – 2021-03-13 (×3): 1 mg via INTRAVENOUS
  Filled 2021-03-12 (×3): qty 1

## 2021-03-12 MED ORDER — LEVETIRACETAM 500 MG PO TABS
500.0000 mg | ORAL_TABLET | Freq: Two times a day (BID) | ORAL | Status: DC
Start: 2021-03-12 — End: 2021-03-15
  Administered 2021-03-12 – 2021-03-15 (×7): 500 mg via ORAL
  Filled 2021-03-12 (×7): qty 1

## 2021-03-12 MED ORDER — LISINOPRIL 10 MG PO TABS
40.0000 mg | ORAL_TABLET | Freq: Every day | ORAL | Status: DC
Start: 2021-03-12 — End: 2021-03-15
  Administered 2021-03-12 – 2021-03-15 (×4): 40 mg via ORAL
  Filled 2021-03-12 (×4): qty 4

## 2021-03-12 MED ORDER — ALBUTEROL SULFATE HFA 108 (90 BASE) MCG/ACT IN AERS
2.0000 | INHALATION_SPRAY | RESPIRATORY_TRACT | Status: DC | PRN
Start: 2021-03-12 — End: 2021-03-15
  Filled 2021-03-12: qty 8

## 2021-03-12 MED ORDER — GLUCOSE 40 % PO GEL (WRAP)
15.0000 g | ORAL | Status: DC | PRN
Start: 2021-03-12 — End: 2021-03-15

## 2021-03-12 MED ORDER — DEXTROSE 50 % IV SOLN
12.5000 g | INTRAVENOUS | Status: DC | PRN
Start: 2021-03-12 — End: 2021-03-15

## 2021-03-12 MED ORDER — LIDOCAINE VISCOUS HCL 2 % MT SOLN
10.0000 mL | Freq: Once | OROMUCOSAL | Status: AC
Start: 2021-03-12 — End: 2021-03-12
  Administered 2021-03-12: 10 mL via OROMUCOSAL
  Filled 2021-03-12: qty 15

## 2021-03-12 MED ORDER — AMLODIPINE BESYLATE 5 MG PO TABS
5.0000 mg | ORAL_TABLET | Freq: Every day | ORAL | Status: DC
Start: 2021-03-12 — End: 2021-03-15
  Administered 2021-03-12 – 2021-03-15 (×4): 5 mg via ORAL
  Filled 2021-03-12 (×4): qty 1

## 2021-03-12 MED ORDER — ENOXAPARIN SODIUM 40 MG/0.4ML IJ SOSY
40.0000 mg | PREFILLED_SYRINGE | Freq: Every day | INTRAMUSCULAR | Status: DC
Start: 2021-03-12 — End: 2021-03-13
  Administered 2021-03-12 – 2021-03-13 (×2): 40 mg via SUBCUTANEOUS
  Filled 2021-03-12 (×2): qty 0.4

## 2021-03-12 MED ORDER — ALUM & MAG HYDROXIDE-SIMETH 200-200-20 MG/5ML PO SUSP
30.0000 mL | Freq: Two times a day (BID) | ORAL | Status: DC | PRN
Start: 2021-03-12 — End: 2021-03-15

## 2021-03-12 NOTE — ED Notes (Signed)
[facility] EMERGENCY DEPARTMENT  ED NURSING NOTE FOR THE RECEIVING INPATIENT NURSE   ED NURSE Debby Bud [phone]   ED CHARGE RN [phone]   ADMISSION INFORMATION   [name] Cook is a 45 y.o. female admitted with a diagnosis of:    1. Right sided weakness    2. Facial droop    3. Sensory loss    4. Chest pain, unspecified type         Isolation: None   Allergies: Contrast [iodinated contrast media], Fioricet [butalbital-apap-caffeine], Bentyl [dicyclomine], Fentanyl, Iodine, Motrin [ibuprofen], Percocet [oxycodone-acetaminophen], Shellfish-derived products, Toradol [ketorolac tromethamine], Tramadol, and Tylenol [acetaminophen]   Holding Orders confirmed? Yes   Belongings Documented? Yes   Home medications sent to pharmacy confirmed? No   NURSING CARE   Patient Comes From:   Mental Status: Home Independent  alert and oriented   ADL: Independent with all ADLs   Ambulation: no difficulty   Pertinent Information  and Safety Concerns: none     COVID Test sent to lab? Yes   VITAL SIGNS   Time BP Temp Pulse Resp SpO2   0454 178/84 97.9 68 18 94   CT / NIH   CT Head ordered on this patient?  Yes   NIH/Dysphagia assessment done prior to admission? Yes   PERSONAL PROTECTIVE EQUIPMENT   Gloves, Goggles and N95   LAB RESULTS   Labs Reviewed   CBC AND DIFFERENTIAL - Abnormal; Notable for the following components:       Result Value    WBC 10.55 (*)     MPV 12.9 (*)     Neutrophils Absolute 7.03 (*)     All other components within normal limits   COMPREHENSIVE METABOLIC PANEL - Abnormal; Notable for the following components:    Alkaline Phosphatase 124 (*)     Globulin 4.3 (*)     All other components within normal limits   COVID-19 (SARS-COV-2)    Narrative:     o Collect and clearly label specimen type:  o PREFERRED-Upper respiratory specimen: One Nasal Swab in  [employer].  o Hand deliver to laboratory ASAP  Indication for testing->Extended care facility admission to  semi private room  Screening   HIGH SENSITIVITY  TROPONIN-I   HIGH SENSITIVITY TROPONIN-I WITH DELTA   GFR          Ticket to Ride Printed: Yes

## 2021-03-12 NOTE — SLP Eval Note (Signed)
[facility]  Speech and Language Therapy Bedside Swallow Evaluation     Patient:  Marcedes Tech MRN#:  [id number]  Unit:  25 [facility] Room/Bed:  A2506/A2506-B    Time of Treatment:   Time Calculation  SLP Received On: 03/12/21  Start Time: 1442  Stop Time: 1458  Time Calculation (min): 16 min    Consult received for [name] for SLP Bedside Swallow Evaluation and Treatment.    Medical Diagnosis: Sensory loss [R20.0]  Facial droop [R29.810]  Right sided weakness [R53.1]  Chest pain, unspecified type [R07.9]    History of Present Illness: Sutton Plake is a 45 y.o. female admitted on 03/11/2021 with HLD, HTN, seizures, possible CVA with admission c/o R sided facial weakness, numbness, R body weakness, chest pain, home BP of 245 systolic.  She notified her PMD and was told to come to the hospital.  BP was 197 in the hospital.  It had been at least 12 hours since onset of symptoms.  She had negative head CT.  Given NIHSS = 5 or 6 but also claim of severe CT contrast allergy - she underwent emergency MRI brain and MRA head/neck vessels - which was essentially negative.    Patient Active Problem List   Diagnosis    Chest pain    Stroke-like symptoms    Seizures    Facial droop    Tension type headache    History of stroke    Class 2 obesity in adult    Hematemesis    Blood loss anemia    HLD (hyperlipidemia)    Numbness and tingling    Hemorrhoids    Right hemiparesis    Nonspecific abnormal electroencephalogram (EEG)    Headache    Abdominal pain    Weakness    Cellulitis of other specified site    Wound dehiscence    Hypertensive urgency    History of seizure disorder    COVID-19    HTN (hypertension)    Right sided weakness        Past Medical/Surgical History:  Past Medical History:   Diagnosis Date    Hypercholesteremia     Hypertension     Seizures     Stroke     2015 and 2017, right side    TIA (transient ischemic attack) 2017      Past Surgical History:   Procedure Laterality Date     APPENDECTOMY (OPEN)      CHOLECYSTECTOMY      EGD, BIOPSY N/A 04/16/2017    Procedure: EGD, BIOPSY;  Surgeon: Pershing Proud, MD;  Location: [facility];  Service: Gastroenterology;  Laterality: N/A;    HYSTERECTOMY           History/Current Status:  Current Status  Respiratory Status: room air, within normal limits  Behavior/Mental Status: Awake/alert, Able to follow directions, Cooperative  Nutrition: oral  Diet Prior to Study: regular, thin liquids    Subjective: Patient is agreeable to participation in the therapy session. Patient's medical condition is appropriate for Speech therapy intervention at this time.    Objective:  Observation of Patient/Vital Signs:  Patient is in bed with no medical equipment in place.    Oral Motor Skills:  Engineer, maintenance (IT) Skills: within functional limits    Deglutition Skills:  Deglutition Skills  Position: upright 90 degrees  Food(s) Tested: thin liquid, puree, solid  Oral Stage: adequate  Pharyngeal Stage: adequate  Esophageal Stage: appears [facility]  Assessment:   Patient seen in room for bedside swallow evaluation. Patient completed OM exam with WFL lingual and labial ROM and strength. Patient does have some slight facial droop on the right with bumps underneath, possible allergic reaction per RN. Patient trialed thin liquids via cup and straw without overt s/s of aspiration. Patient trialed puree and solids without overt s/s of aspiration and no oral residue. Suspect WFL oral pharyngeal swallow.    Goals:  Goals  Patient will orally prepare and swallow PO trials of: regular, without overt s/s of aspiration 5/5 boluses  Patient will consume PO trials of: thin liquids, without overt s/s of aspiration 5/5 sips    Plan/Recommendations:     Diet Solids Recommendation: regular  Diet Liquids Recommendations: thin consistency, from a cup, from a straw  Precautions/Compensations: Awake/alert, Upright 90 degrees for all oral intake, Alternate solids and liquids, Small  bites/sips, Eat/feed slowly  Recommendation Discussed With: : Patient, Nurse  SLP Frequency Recommended: one time visit  Administration of Medications: whole with liquids; as tolerated  Aspiration Precautions posted at bedside: discussed with patient at bedside    03/12/2021  Monte Fantasia M.S., CCC-SLP  [phone]

## 2021-03-12 NOTE — Consults (Signed)
NEUROLOGY CONSULTATION    Date Time: 03/12/21 10:32 AM  Patient Name: [name] Cook  Attending Physician: Karlton Lemon, MD      Assessment & Plan:   Numerous recurrent episodes of neurological symptoms with negative prior work ups with suspicion of functional exams (as today). MRI brain has never shown an acute infarction (multiple such evaluations since 2017).    Address hypertension and chest pain.  No further neurological work up at this time.    History of Present Illness:   45 yo female with HLD, HTN, seizures, ? CVA with admission c/o R sided facial weakness, numbness, R body weakness, chest pain, home BP of 245 systolic.  She notified her PMD and was told to come to the hospital.  BP was 197 in the hospital.  It had been at least 12 hours since onset of symptoms.  She had negative head CT.  Given NIHSS = 5 or 6 but also claim of severe CT contrast allergy - she underwent emergency MRI brain and MRA head/neck vessels - which was essentially negative.    Past Medical History:     Past Medical History:   Diagnosis Date    Hypercholesteremia     Hypertension     Seizures     Stroke     2015 and 2017, right side    TIA (transient ischemic attack) 2017       Meds:      Scheduled Meds: PRN Meds:    amLODIPine, 5 mg, Oral, Daily  aspirin, 81 mg, Oral, Daily  atorvastatin, 40 mg, Oral, Daily  enoxaparin, 40 mg, Subcutaneous, Daily  levETIRAcetam, 500 mg, Oral, Q12H SCH  lidocaine, 1 patch, Transdermal, Q24H  lisinopril, 40 mg, Oral, Daily  multivitamin, 1 tablet, Oral, Daily  pantoprazole, 40 mg, Oral, QAM AC        Continuous Infusions:   acetaminophen, 325 mg, Q4H PRN  albuterol sulfate HFA, 2 puff, Q4H PRN  alum & mag hydroxide-simethicone, 30 mL, Q12H PRN  dextrose, 15 g of glucose, PRN   And  dextrose, 12.5 g, PRN   And  dextrose, 12.5 g, PRN   And  glucagon (rDNA), 1 mg, PRN  famotidine, 20 mg, Q12H PRN  lidocaine viscous, 10 mL, Q12H PRN  LORazepam, 1 mg, Q8H PRN  magnesium sulfate, 1 g, PRN  meclizine, 25  mg, TID PRN  melatonin, 3 mg, QHS PRN  naloxone, 0.2 mg, PRN  ondansetron, 4 mg, Q6H PRN   Or  ondansetron, 4 mg, Q6H PRN  potassium & sodium phosphates, 2 packet, PRN  potassium chloride, 0-40 mEq, PRN   And  potassium chloride, 10 mEq, PRN          I personally reviewed all of the medications.  Medication list generated using all available resources.  Elder abuse (physical)  - negative  Advanced care plan - reviewed from chart or in discussion with pt or family    Allergies   Allergen Reactions    Contrast [Iodinated Contrast Media] Anaphylaxis     Patient woke up in ICU after having CT with contrast, last remembers being in CT    Fioricet [Butalbital-Apap-Caffeine] Hives    Bentyl [Dicyclomine] Edema    Fentanyl     Iodine     Motrin [Ibuprofen] Swelling    Percocet [Oxycodone-Acetaminophen]     Shellfish-Derived Products     Toradol [Ketorolac Tromethamine]     Tramadol     Tylenol [Acetaminophen] Hives  Social & Family History:     Social History     Socioeconomic History    Marital status: Single   Tobacco Use    Smoking status: Never    Smokeless tobacco: Never   Vaping Use    Vaping Use: Never used   Substance and Sexual Activity    Alcohol use: No    Drug use: No       Family History   Problem Relation Age of Onset    Myocardial Infarction Father 45    Deep vein thrombosis Father     No known problems Mother        Review of Systems:   No eye, ear nose, throat problems; no coughing or wheezing or shortness of breath, No chest pain or orthopnea, no abdominal pain, nausea or vomiting, No pain in the body or extremities, no psychiatric, neurological, endocrine, hematological or cardiac complaints except as noted above.     Physical Exam:   Blood pressure 131/83, pulse 72, temperature 97.7 F (36.5 C), temperature source Oral, resp. rate 17, height 1.778 m (5\' 10" ), weight 147.6 kg (325 lb 4.8 oz), last menstrual period 10/15/2018, SpO2 97 %.    Pt is alert  Appears somewhat unhappy  Face twisted upon  smiling - not moving as much on the left side  Reduced sensation to LT R face/R arm    Labs:     Recent Labs   Lab 03/11/21  2101   Glucose 86   BUN 13.0   Creatinine 0.8   Calcium 9.3   Sodium 143   Potassium 3.8   Chloride 106   CO2 26   Albumin 4.0   AST (SGOT) 21   ALT 25   Bilirubin, Total 0.8   Alkaline Phosphatase 124*     Recent Labs   Lab 03/11/21  2101   WBC 10.55*   Hgb 13.1   Hematocrit 40.2   MCV 92.4   MCH 30.1   MCHC 32.6   Platelets 165         No results for input(s): PTT, PT, INR in the last 72 hours.       Radiology Results (24 Hour)       Procedure Component Value Units Date/Time    MRA Neck WO Contrast [[id number]] Collected: 03/12/21 0252    Order Status: Completed Updated: 03/12/21 0259    Narrative:      Clinical History:    Neuro deficit, acute, stroke suspected    Technique:    MR ANGIOGRAM NECK WO CONTRAST MRA of the neck was performed without  contrast as per departmental protocol. 3-D MIP reformatted images and  source images were submitted for review.    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 There is a normal configuration of  the great vessels arising from the aorta. The common carotid arteries  are unremarkable. The carotid bifurcation is free of flow-limiting  stenosis bilaterally. The internal carotid arteries are unremarkable.  The vertebral arteries are codominant and normal in course and caliber  bilaterally.      Impression:          No carotid or vertebral artery stenosis, dissection or occlusion within  the neck.    Stable study.    Alric Seton MD, MD   03/12/2021 2:57 AM    MRI Brain WO Contrast [[id number]] Collected: 03/12/21 0249  Order Status: Completed Updated: 03/12/21 0254    Narrative:      Clinical History:    right sided weakness    Technique:    MRI BRAIN WO CONTRAST MRI of the brain was performed without contrast as  per departmental protocol. Multiplanar reformatted images were submitted  for  review.    Comparison:    MRI of the brain dated 09/14/2020. CT scan of the brain dated  03/11/2021.    Findings:    The ventricles and sulci are normal. There is no intra or extra-axial  fluid collection, midline shift or mass effect. Again noted are 2  punctate foci of T2/FLAIR hyperintensity within the left centrum  semiovale. There are no areas of restricted diffusion. The basal  cisterns are patent. The regions of the sella, pineal gland, and  craniocervical junction are unremarkable. Flow-voids are identified  within the vessels of the skull base. The globes and orbits are  unremarkable. There is trace ethmoid sinus mucosal thickening. The  mastoid air cells are clear. The scalp and calvarium are unremarkable.      Impression:          No acute intracranial abnormality.    2 punctate foci of T2/FLAIR hyperintensity within the left centrum  semiovale are again identified, and may be secondary to chronic  microvascular ischemic change for migraine.    No significant interval change.    Alric Seton MD, MD   03/12/2021 2:52 AM    MRA Head (intracranial) Without Contrast [[id number]] Collected: 03/12/21 0244    Order Status: Completed Updated: 03/12/21 0251    Narrative:      Clinical History:    Neuro deficit, acute, stroke suspected    Technique:    MR ANGIOGRAM HEAD WO CONTRAST 3-D time-of-flight MRA of the brain was  performed without contrast as per departmental protocol. 3-D MIP and  source images were submitted for review.    Comparison:    MRA of the brain dated 10/30/2018    Findings:    The internal carotid arteries, middle cerebral arteries, and anterior  cerebral arteries are unremarkable. The anterior communicating artery is  unremarkable. The intracranial segments of the vertebral arteries, right  posterior inferior cerebellar arteries, basilar artery, superior  cerebellar arteries and posterior cerebral arteries are unremarkable.  The posterior communicating arteries are unremarkable. There is  no  evidence of an aneurysm, significant stenosis or major branch occlusion.      Impression:          No evidence of an aneurysm, significant stenosis or major branch  occlusion.    Alric Seton MD, MD   03/12/2021 2:49 AM    Chest AP Portable [[id number]] Collected: 03/11/21 2053    Order Status: Completed Updated: 03/11/21 2056    Narrative:      CLINICAL INDICATION: Chest Pain    EXAM: XR CHEST AP PORTABLE    COMPARISON: 09/14/2020    FINDINGS:  Heart size is normal. No focal consolidation, pneumothorax, or pleural  effusion is seen. No acute osseous abnormality is noted.      Impression:        1.  No acute findings.    Ewing Schlein MD, MD   03/11/2021 8:53 PM    CT Head without Contrast [[id number]] Collected: 03/11/21 2029    Order Status: Completed Updated: 03/11/21 2038    Narrative:      CLINICAL INDICATION: Neuro deficit, acute, stroke suspected    TECHNIQUE: Axial  CT of the head was performed without contrast.     A combination of automatic exposure control, adjustment of the MA and/or  KV according to patient size and/or use of iterative reconstructive  technique was utilized.    COMPARISON: 09/14/2020    FINDINGS:  No extra-axial collection/hemorrhage, mass, mass effect, or midline  shift is noted. The ventricles and sulci are normal for age. No evidence  of acute transcortical infarct.     The orbits are normal. No focal soft tissue abnormalities identified.  The visualized paranasal sinuses and mastoid air cells are normal. No  acute osseous abnormality is identified.      Impression:        1.  No acute findings.    Ewing Schlein MD, MD   03/11/2021 8:35 PM             All recent brain and spine imaging (MRI, CT) results reviewed.    Chart reviewed    Code status confirmed    Case discussed with: patient and ER attending    40 minutes;  involving time spent examining patient, in counseling or coordination of care, reviewing test results, and in documentation.    Signed by: Ardelle Anton,  MD  Spectralink: [phone]       Answering Service: [phone]

## 2021-03-12 NOTE — Plan of Care (Signed)
NURSING SHIFT NOTE     Patient: [name] Cook  Day: 0  SHIFT EVENTS    Patient alert and oriented x4, admitted from ED, admission datas completed , telemetry orders applied. Due medications given and well tolerated.  Safety and fall precautions remain in place. Purposeful rounding completed.          ASSESSMENT     Changes in assessment from patient's baseline this shift:    Neuro: No  CV: No  Pulm: No  Peripheral Vascular: No  HEENT: No  GI: No  BM during shift: No, Last BM: Last BM Date: 03/11/21  GU: No   Integ: No  MS: No    Pain: No change  Pain Interventions: Rest  Medications Utilized: Lidocaine patch,   Mobility: PMP Activity: Step 7 - Walks out of Room of Distance Walked (ft) (Step 6,7): 0 Feet           Lines     Patient Lines/Drains/Airways Status       Active Lines, Drains and Airways       Name Placement date Placement time Site Days    Peripheral IV 03/11/21 20 G Right Upper Arm 03/11/21  2047  Upper Arm  less than 1                         VITAL SIGNS     Vitals:    03/12/21 1528   BP:    Pulse: 71   Resp: 18   Temp:    SpO2: 92%       Temp  Min: 97.7 F (36.5 C)  Max: 98.2 F (36.8 C)  Pulse  Min: 62  Max: 90  Resp  Min: 16  Max: 21  BP  Min: 128/65  Max: 197/114  SpO2  Min: 92 %  Max: 100 %    No intake or output data in the 24 hours ending 03/12/21 1621       CARE PLAN     Problem: Safety  Goal: Patient will be free from injury during hospitalization  Outcome: Progressing  Flowsheets (Taken 03/12/2021 1618)  Patient will be free from injury during hospitalization:   Assess patient's risk for falls and implement fall prevention plan of care per policy   Provide and maintain safe environment   Use appropriate transfer methods   Ensure appropriate safety devices are available at the bedside   Hourly rounding   Assess for patients risk for elopement and implement Elopement Risk Plan per policy  Goal: Patient will be free from infection during hospitalization  Outcome: Progressing  Flowsheets (Taken  03/12/2021 1618)  Free from Infection during hospitalization:   Assess and monitor for signs and symptoms of infection   Monitor all insertion sites (i.e. indwelling lines, tubes, urinary catheters, and drains)   Monitor lab/diagnostic results   Encourage patient and family to use good hand hygiene technique     Problem: Pain  Goal: Pain at adequate level as identified by patient  Outcome: Progressing  Flowsheets (Taken 03/12/2021 1618)  Pain at adequate level as identified by patient:   Identify patient comfort function goal   Assess pain on admission, during daily assessment and/or before any "as needed" intervention(s)   Reassess pain within 30-60 minutes of any procedure/intervention, per Pain Assessment, Intervention, Reassessment (AIR) Cycle   Evaluate patient's satisfaction with pain management progress   Evaluate if patient comfort function goal is met   Offer non-pharmacological pain  management interventions     Problem: Psychosocial and Spiritual Needs  Goal: Demonstrates ability to cope with hospitalization/illness  Outcome: Progressing  Flowsheets (Taken 03/12/2021 1618)  Demonstrates ability to cope with hospitalizations/illness:   Encourage verbalization of feelings/concerns/expectations   Provide quiet environment   Encourage patient to set small goals for self   Assist patient to identify own strengths and abilities

## 2021-03-12 NOTE — H&P (Signed)
Patient: [name] Cook  Date: 03/11/2021   DOB: 1975-08-18  Admission Date: 03/11/2021   MRN: [id number]  Attending: Karlton Lemon, MD         Chief Complaint   Patient presents with    Chest Pain    Facial Droop      History Gathered From: patient    HISTORY AND PHYSICAL     [name] Cook is a 45 y.o. female with a PMHx of Right sided weakness,facial droop and chest pain 12/31. Last known well at 8 AM.  Patient states when she went to the restroom at that time she felt normal.  Patient went back to bed and when she woke up at 11 AM she noticed a facial droop.  Patient reports she experienced sudden onset right-sided hemiparesis followed by substernal chest pain radiating to the left arm.  She called her outpatient primary care provider who recommended her take her blood pressure, patient reports her blood pressure was 238/110.  Her primary care provider recommended she come into the emergency department.    Denies any headache, changes in vision.  Reports her chest pain is radiating down to her left arm, pressure in sensation, and constant in duration.  She reports only IV morphine improved her pain.  She has multiple other drug allergies to Tylenol, NSAIDs, all of which cause throat swelling and hives.    Patient has never been worked up for obstructive sleep apnea, she reports she is compliant with her lisinopril and hydrochlorothiazide, and she reports she has been trying to lose weight by walking.  She was last admitted to [facility] 02/08/2021 with similar symptoms, underwent MRI at that time. Please note she has had multiple admissions for stroke and chest pain work-up in the past.  MRI of the C-spine shows minimal disease.  She has been followed inpatient by cardiology as well as neurology.     The emergency department she was found to have SBP was 197 in the hospital a leukocytosis of 10.55, creatinine normal at 0.8, alkaline phosphatase of 124, high-sensitivity troponin 3.8, that shows 6-10 white blood cells.  EKG w nsr and LVH.      Past Medical History:   Diagnosis Date    Hypercholesteremia     Hypertension     Seizures     Stroke     2015 and 2017, right side    TIA (transient ischemic attack) 2017       Past Surgical History:   Procedure Laterality Date    APPENDECTOMY (OPEN)      CHOLECYSTECTOMY      EGD, BIOPSY N/A 04/16/2017    Procedure: EGD, BIOPSY;  Surgeon: Pershing Proud, MD;  Location: [facility];  Service: Gastroenterology;  Laterality: N/A;    HYSTERECTOMY         Prior to Admission medications    Medication Sig Start Date End Date Taking? Authorizing Provider   albuterol sulfate HFA (PROVENTIL) 108 (90 Base) MCG/ACT inhaler Inhale 2 puffs into the lungs every 4 (four) hours as needed for Wheezing or Shortness of Breath 03/13/20   [name] Cahill, PA   amLODIPine (NORVASC) 5 MG tablet Take 1 tablet (5 mg total) by mouth daily 09/15/20   Scarlette Ar, MD   aspirin 81 MG chewable tablet Chew 1 tablet (81 mg total) by mouth daily 09/19/17   Derek Mound, MD   atorvastatin (LIPITOR) 40 MG tablet Take 1 tablet (40 mg total) by mouth daily 09/15/20   Tirrell,  Dois Davenport, MD   LevETIRAcetam (KEPPRA PO) Take 500 mg by mouth 2 (two) times daily.       [provider]   lidocaine (LIDODERM) 5 % Place 1 patch onto the skin every 24 hours Remove & Discard patch within 12 hours or as directed by MD 09/18/20   Scarlette Ar, MD   lisinopril (ZESTRIL) 40 MG tablet Take 1 tablet (40 mg total) by mouth daily 09/15/20   Scarlette Ar, MD   meclizine (ANTIVERT) 25 MG tablet Take 1 tablet (25 mg total) by mouth 3 (three) times daily as needed for Nausea or Dizziness 09/18/20   Scarlette Ar, MD   multivitamin (MULTIVITAMIN) Tab Take 1 tablet by mouth daily 10/17/19   Scarlette Ar, MD   pantoprazole (PROTONIX) 40 MG tablet Take 1 tablet (40 mg total) by mouth daily 10/30/18   Karlton Lemon, MD       Allergies   Allergen Reactions    Contrast [Iodinated Contrast Media] Anaphylaxis     Patient woke up in ICU after  having CT with contrast, last remembers being in CT    Fioricet [Butalbital-Apap-Caffeine] Hives    Bentyl [Dicyclomine] Edema    Fentanyl     Iodine     Motrin [Ibuprofen] Swelling    Percocet [Oxycodone-Acetaminophen]     Shellfish-Derived Products     Toradol [Ketorolac Tromethamine]     Tramadol     Tylenol [Acetaminophen] Hives       CODE STATUS: full    PRIMARY CARE MD: Letitia Libra, MD    Family History   Problem Relation Age of Onset    Myocardial Infarction Father 81    Deep vein thrombosis Father     No known problems Mother        Social History     Tobacco Use    Smoking status: Never    Smokeless tobacco: Never   Vaping Use    Vaping Use: Never used   Substance Use Topics    Alcohol use: No    Drug use: No       REVIEW OF SYSTEMS   Positive for: as above  Negative for: as above   All ROS completed and otherwise negative.    PHYSICAL EXAM     Vital Signs (most recent): BP 141/78   Pulse 69   Temp 97.9 F (36.6 C) (Oral)   Resp 18   Ht 1.778 m (5\' 10" )   Wt 136.5 kg (301 lb)   LMP 10/15/2018 (Exact Date)   SpO2 95%   BMI 43.19 kg/m   Constitutional: No apparent distress. Patient speaks freely in full sentences.   HEENT: NC/AT, PERRL, no scleral icterus or conjunctival pallor, no nasal discharge, MMM, oropharynx without erythema or exudate  Neck: trachea midline, supple, no cervical or supraclavicular lymphadenopathy or masses  Cardiovascular: RRR, normal S1 S2, no murmurs, gallops, palpable thrills, no JVD, Non-displaced PMI.  Respiratory: Normal rate. No retractions or increased work of breathing. Clear to auscultation and percussion bilaterally.  Gastrointestinal: +BS, non-distended, soft, non-tender, no rebound or guarding, no hepatosplenomegaly  Genitourinary: no suprapubic or costovertebral angle tenderness  Musculoskeletal: ROM and motor strength grossly normal. No clubbing, edema, or cyanosis. DP and radial pulses 2+ and symmetric.  Skin exam:  pink  Neurologic: EOMI, CN 2-12 grossly  intact. no gross motor or sensory deficits  Psychiatric: AAOx3, affect and mood appropriate. The patient is alert, interactive, appropriate.  Capillary refill:  Normal  Exam done by Karlton Lemon, MD on 03/12/21 at 7:36 AM      LABS & IMAGING     Recent Results (from the past 24 hour(s))   CBC and differential    Collection Time: 03/11/21  9:01 PM   Result Value Ref Range    WBC 10.55 (H) 3.10 - 9.50 x10 3/uL    Hgb 13.1 11.4 - 14.8 g/dL    Hematocrit 16.1 09.6 - 43.7 %    Platelets 165 142 - 346 x10 3/uL    RBC 4.35 3.90 - 5.10 x10 6/uL    MCV 92.4 78.0 - 96.0 fL    MCH 30.1 25.1 - 33.5 pg    MCHC 32.6 31.5 - 35.8 g/dL    RDW 14 11 - 15 %    MPV 12.9 (H) 8.9 - 12.5 fL    Neutrophils 66.6 None %    Lymphocytes Automated 22.9 None %    Monocytes 7.0 None %    Eosinophils Automated 2.5 None %    Basophils Automated 0.6 None %    Immature Granulocytes 0.4 None %    Nucleated RBC 0.0 0.0 - 0.0 /100 WBC    Neutrophils Absolute 7.03 (H) 1.10 - 6.33 x10 3/uL    Lymphocytes Absolute Automated 2.42 0.42 - 3.22 x10 3/uL    Monocytes Absolute Automated 0.74 0.21 - 0.85 x10 3/uL    Eosinophils Absolute Automated 0.26 0.00 - 0.44 x10 3/uL    Basophils Absolute Automated 0.06 0.00 - 0.08 x10 3/uL    Immature Granulocytes Absolute 0.04 0.00 - 0.07 x10 3/uL    Absolute NRBC 0.00 0.00 - 0.00 x10 3/uL   Comprehensive metabolic panel    Collection Time: 03/11/21  9:01 PM   Result Value Ref Range    Glucose 86 70 - 100 mg/dL    BUN 04.5 7.0 - 40.9 mg/dL    Creatinine 0.8 0.4 - 1.0 mg/dL    Sodium 811 914 - 782 mEq/L    Potassium 3.8 3.5 - 5.3 mEq/L    Chloride 106 99 - 111 mEq/L    CO2 26 17 - 29 mEq/L    Calcium 9.3 8.5 - 10.5 mg/dL    Protein, Total 8.3 6.0 - 8.3 g/dL    Albumin 4.0 3.5 - 5.0 g/dL    AST (SGOT) 21 5 - 41 U/L    ALT 25 0 - 55 U/L    Alkaline Phosphatase 124 (H) 37 - 117 U/L    Bilirubin, Total 0.8 0.2 - 1.2 mg/dL    Globulin 4.3 (H) 2.0 - 3.6 g/dL    Albumin/Globulin Ratio 0.9 0.9 - 2.2    Anion Gap 11.0 5.0 - 15.0    High Sensitivity Troponin-I at 0 hrs    Collection Time: 03/11/21  9:01 PM   Result Value Ref Range    hs Troponin-I 4.0 SEE BELOW ng/L   GFR    Collection Time: 03/11/21  9:01 PM   Result Value Ref Range    EGFR >60.0    High Sensitivity Troponin-I at 2 hrs with calculated Delta    Collection Time: 03/11/21 11:48 PM   Result Value Ref Range    hs Troponin-I 4.1 SEE BELOW ng/L    hs Troponin-I Delta 0.1 ng/L   COVID-19 (SARS-CoV-2) only ([facility]) asymptomatic admission - Hospitals    Collection Time: 03/12/21  5:51 AM    Specimen: Nasopharyngeal   Result Value Ref Range    Purpose of  COVID testing Screening     SARS-CoV-2 Specimen Source Nasal Swab     SARS CoV 2 Overall Result Not Detected            IMAGING:  Upon my review: MRI brain neg for ischemic     CARDIAC:  EKG Interpretation (upon my review):  nsr, + LVH    Markers:  Recent Labs   Lab 03/11/21  2348 03/11/21  2101   hs Troponin-I 4.1 4.0   hs Troponin-I Delta 0.1  --        EMERGENCY DEPARTMENT COURSE:  Orders Placed This Encounter   Procedures    COVID-19 (SARS-CoV-2) only ([facility]) asymptomatic admission - Hospitals    Chest AP Portable    CT Head without Contrast    MRA Head (intracranial) Without Contrast    MRA Neck WO Contrast    MRI Brain WO Contrast    CBC and differential    Comprehensive metabolic panel    High Sensitivity Troponin-I at 0 hrs    High Sensitivity Troponin-I at 2 hrs with calculated Delta    GFR    Vital Signs    Complete MRI Screening Questionnaire    ECG 12 lead    Saline lock IV    Admit to Inpatient       ASSESSMENT & PLAN     Damia Bobrowski is a 45 y.o. female w PMHX HTN, morbid obesity, recurrent chest cp admitted under with Facial droop Right sided weakness,facial droop and chest pain 12/31.    Patient Active Hospital Problem List:  Facial droop (05/11/2016)     Right sided weakness  Left sided chest pain  Accelerated HTN/Hypertensive urgency   -EKG nonischemic, high-sensitivity troponin negative, status post ischemic  evaluation within the last year as well as MRI/MRA of the brain this admission negative for acute ischemia  -changes likely secondary to hypertensive urgency  -if no resolution of chest pain with bp control, check CTPE  -cont lisinopril, home HCTZ, amlodipine, add labetalol and hydralazine  - follow patient for allergic reactions since she has multiple allergies  -recommend outpt OSA eval given nocturnal elev in BPs      UTI  -IV cefriaxone    Morbid obesity  -pt walking to loose weight  -rec continued efforts as excess weight likely contributing     Nutrition  Low salt     DVT/VTE Prophylaxis      Anticipated medical stability for discharge: 24 hrs     Service status/Reason for ongoing hospitalization: bp control, sx managmeent   Anticipated Discharge Needs: none     Signed,  Karlton Lemon, MD    03/12/2021 7:36 AM  Time Elapsed: 

## 2021-03-12 NOTE — Plan of Care (Signed)
Goals:  Goals  Patient will orally prepare and swallow PO trials of: regular, without overt s/s of aspiration 5/5 boluses  Patient will consume PO trials of: thin liquids, without overt s/s of aspiration 5/5 sips    Plan/Recommendations:     Diet Solids Recommendation: regular  Diet Liquids Recommendations: thin consistency, from a cup, from a straw  Precautions/Compensations: Awake/alert, Upright 90 degrees for all oral intake, Alternate solids and liquids, Small bites/sips, Eat/feed slowly  Recommendation Discussed With: : Patient, Nurse  SLP Frequency Recommended: one time visit  Administration of Medications: whole with liquids; as tolerated  Aspiration Precautions posted at bedside: discussed with patient at bedside

## 2021-03-13 DIAGNOSIS — R531 Weakness: Secondary | ICD-10-CM

## 2021-03-13 DIAGNOSIS — R200 Anesthesia of skin: Secondary | ICD-10-CM

## 2021-03-13 LAB — CBC
Absolute NRBC: 0 10*3/uL (ref 0.00–0.00)
Hematocrit: 39 % (ref 34.7–43.7)
Hgb: 12.7 g/dL (ref 11.4–14.8)
MCH: 30.2 pg (ref 25.1–33.5)
MCHC: 32.6 g/dL (ref 31.5–35.8)
MCV: 92.9 fL (ref 78.0–96.0)
MPV: 12.6 fL — ABNORMAL HIGH (ref 8.9–12.5)
Nucleated RBC: 0 /100 WBC (ref 0.0–0.0)
Platelets: 150 10*3/uL (ref 142–346)
RBC: 4.2 10*6/uL (ref 3.90–5.10)
RDW: 14 % (ref 11–15)
WBC: 8.68 10*3/uL (ref 3.10–9.50)

## 2021-03-13 LAB — HIGH SENSITIVITY TROPONIN-I: hs Troponin-I: 3 ng/L

## 2021-03-13 LAB — BASIC METABOLIC PANEL
Anion Gap: 7 (ref 5.0–15.0)
BUN: 13 mg/dL (ref 7.0–21.0)
CO2: 27 mEq/L (ref 17–29)
Calcium: 8.6 mg/dL (ref 8.5–10.5)
Chloride: 107 mEq/L (ref 99–111)
Creatinine: 0.8 mg/dL (ref 0.4–1.0)
Glucose: 124 mg/dL — ABNORMAL HIGH (ref 70–100)
Potassium: 3.9 mEq/L (ref 3.5–5.3)
Sodium: 141 mEq/L (ref 135–145)

## 2021-03-13 LAB — IHS D-DIMER: D-Dimer: 0.89 ug/mL FEU — ABNORMAL HIGH (ref 0.00–0.60)

## 2021-03-13 LAB — GFR: EGFR: >60

## 2021-03-13 MED ORDER — MORPHINE SULFATE 2 MG/ML IJ/IV SOLN (WRAP)
2.0000 mg | Status: DC | PRN
Start: 2021-03-13 — End: 2021-03-14
  Administered 2021-03-13 – 2021-03-14 (×4): 2 mg via INTRAVENOUS
  Filled 2021-03-13 (×4): qty 1

## 2021-03-13 MED ORDER — LORAZEPAM 1 MG PO TABS
1.0000 mg | ORAL_TABLET | Freq: Three times a day (TID) | ORAL | Status: DC | PRN
Start: 2021-03-13 — End: 2021-03-15
  Administered 2021-03-14 – 2021-03-15 (×4): 1 mg via ORAL
  Filled 2021-03-13 (×5): qty 1

## 2021-03-13 MED ORDER — CYCLOBENZAPRINE HCL 5 MG PO TABS
5.0000 mg | ORAL_TABLET | Freq: Three times a day (TID) | ORAL | Status: DC | PRN
Start: 2021-03-13 — End: 2021-03-13

## 2021-03-13 MED ORDER — LABETALOL HCL 100 MG PO TABS
100.0000 mg | ORAL_TABLET | Freq: Two times a day (BID) | ORAL | Status: DC
Start: 2021-03-13 — End: 2021-03-15
  Administered 2021-03-13 – 2021-03-15 (×4): 100 mg via ORAL
  Filled 2021-03-13 (×4): qty 1

## 2021-03-13 MED ORDER — ENOXAPARIN SODIUM 150 MG/ML IJ SOSY
1.0000 mg/kg | PREFILLED_SYRINGE | Freq: Two times a day (BID) | INTRAMUSCULAR | Status: DC
Start: 2021-03-13 — End: 2021-03-15
  Administered 2021-03-13 – 2021-03-14 (×3): 150 mg via SUBCUTANEOUS
  Filled 2021-03-13 (×6): qty 1

## 2021-03-13 NOTE — Nursing Progress Note (Signed)
NURSING SHIFT NOTE     Patient: [name] Cook  Day: 1      SHIFT EVENTS     Shift Narrative/Significant Events (PRN med administration, fall, RRT, etc.):     Patient alert oriented x4. VSS morphine prn given for pain. Patient reports in improved see flowsheet for pain score. No issues with nausea or vomiting. Call bell and personal items within reach will continue to monitor patient progression and implement plan of care     Safety and fall precautions remain in place. Purposeful rounding completed.          ASSESSMENT     Changes in assessment from patient's baseline this shift:    Neuro: No  CV: No  Pulm: No  Peripheral Vascular: No  HEENT: No  GI: No  BM during shift: No, Last BM: Last BM Date: 03/11/21  GU: No   Integ: No  MS: No    Pain: Improved  Pain Interventions: Medications  Medications Utilized: morphine intravenous    Mobility: PMP Activity: Step 6 - Walks in Room of Distance Walked (ft) (Step 6,7): 0 Feet           Lines     Patient Lines/Drains/Airways Status       Active Lines, Drains and Airways       Name Placement date Placement time Site Days    Peripheral IV 03/11/21 20 G Right Upper Arm 03/11/21  2047  Upper Arm  1                         VITAL SIGNS     Vitals:    03/13/21 1609   BP: 111/69   Pulse: 64   Resp: 17   Temp: 97.7 F (36.5 C)   SpO2: 95%       Temp  Min: 97.5 F (36.4 C)  Max: 98.2 F (36.8 C)  Pulse  Min: 64  Max: 89  Resp  Min: 16  Max: 18  BP  Min: 108/74  Max: 144/83  SpO2  Min: 93 %  Max: 100 %      Intake/Output Summary (Last 24 hours) at 03/13/2021 1635  Last data filed at 03/13/2021 1159  Gross per 24 hour   Intake 750 ml   Output --   Net 750 ml                CARE PLAN

## 2021-03-13 NOTE — Progress Notes (Signed)
[facility]  PROGRESS NOTE      Patient: [name] Cook  Date: 03/13/2021   LOS: 1 Days  Admission Date: 03/11/2021   MRN: [id number]  Attending: Karlton Lemon, MD  Please contact me on the following Spectralink [phone]      ASSESSMENT/PLAN     [name] Cook is a 46 y.o. female w a PMHX HTN, recurrent admissions and er evaluations for Right sided weakness,facial droop and chest pain admitted with recurrent Right sided weakness,facial droop and chest pain, symptoms lingering longer than usual.  On 12/30, pt went to bed and when she woke up at 11 AM she noticed a facial droop.  Patient reports she experienced sudden onset right-sided hemiparesis followed by substernal chest pain radiating to the left arm.  She called her outpatient primary care provider who recommended her take her blood pressure, patient reports her blood pressure was 238/110.  Her primary care provider recommended she come into the emergency department.      She was last admitted to [facility] 02/08/2021 with similar symptoms, underwent MRI at that time. Please note she has had multiple admissions for stroke and chest pain work-up in the past.  MRI of the C-spine shows minimal disease.  She has been followed inpatient by cardiology as well as neurology.      The emergency department she was found to have SBP was 197 in the hospital a leukocytosis of 10.55, creatinine normal at 0.8, alkaline phosphatase of 124, high-sensitivity troponin 3.8,  EKG w nsr and LVH.    Labetalol and hydralazine added to patient's regimen, outpatient obstructive sleep apnea work-up recommended and continued weight loss efforts.  Patient with multiple drug allergies, morphine did not resolve chest pain, patient cannot tolerate Knotts's nonsteroidal anti-inflammatories.  Patient's chest pain did not improve, could not find any alleviating medications, unfortunately patient continued to have left-sided chest pain that was unresolved, thus D-dimer  & repeat trop were requested and if  positive will check a CT pulmonary embolus, patient reports she has allergies to contrast dye that include swelling and hives, will pretreat with Benadryl and steroids.    Interval Summary:   1/1 didn't sleep well due to neighbor, still w l sided chest pain - chest pain usually subsides by now, morphine doesn't help, pt with many other drug allergies. Pain not reproducible  Patient Active Hospital Problem List:  Facial droop (05/11/2016)     Right sided weakness  Left sided chest pain  Accelerated HTN/Hypertensive urgency   -EKG nonischemic, high-sensitivity troponin negative, status post ischemic evaluation within the last year as well as MRI/MRA of the brain this admission negative for acute ischemia  -changes likely secondary to hypertensive urgency, possibly due to untreated OSA, also on ddx is pe  -since no resolution of chest pain with bp control, ddimer ordered and repeat troponin this am, if + will check CTPE w pre-treatment with benadryl and steroids  -cont lisinopril, home HCTZ, amlodipine, add labetalol and hydralazine  - follow patient for allergic reactions since she has multiple allergies  -recommend outpt OSA eval given nocturnal elev in BPs        Morbid obesity  -pt walking to loose weight  -rec continued efforts as excess weight likely contributing      Nutrition  Low salt      DVT/VTE Prophylaxis lovenox          Code Status: full    DISPO: pending ddimer     Family Contact: husband  SUBJECTIVE     [name] states     MEDICATIONS     Current Facility-Administered Medications   Medication Dose Route Frequency    amLODIPine  5 mg Oral Daily    aspirin  81 mg Oral Daily    atorvastatin  40 mg Oral Daily    enoxaparin  40 mg Subcutaneous Daily    hydrALAZINE  25 mg Oral Q8H SCH    labetalol  200 mg Oral Q12H SCH    levETIRAcetam  500 mg Oral Q12H SCH    lidocaine  1 patch Transdermal Q24H    lisinopril  40 mg Oral Daily    multivitamin  1 tablet Oral Daily    pantoprazole  40 mg Oral QAM AC        PHYSICAL EXAM     Vitals:    03/13/21 1159   BP: 130/79   Pulse: 77   Resp:    Temp: 97.5 F (36.4 C)   SpO2: 94%       Temperature: Temp  Min: 97.5 F (36.4 C)  Max: 98.2 F (36.8 C)  Pulse: Pulse  Min: 66  Max: 89  Respiratory: Resp  Min: 16  Max: 18  Non-Invasive BP: BP  Min: 108/74  Max: 164/84  Pulse Oximetry SpO2  Min: 92 %  Max: 100 %    Intake and Output Summary (Last 24 hours) at Date Time    Intake/Output Summary (Last 24 hours) at 03/13/2021 1201  Last data filed at 03/12/2021 1700  Gross per 24 hour   Intake 850 ml   Output --   Net 850 ml         GEN APPEARANCE: Normal;  A&OX3  HEENT: PERLA; EOMI; Conjunctiva Clear no facial droop on rihgt noted   NECK: Supple; No bruits  CVS: RRR, S1, S2; No M/G/R chest pain not reproducible  LUNGS: CTAB; No Wheezes; No Rhonchi: No rales  ABD: obese, Soft; No TTP; + Normoactive BS  EXT: No edema; Pulses 2+ and intact strength to rle 4/5 rue 4/5  Skin exam:  pink  NEURO: CN 2-12 intact; No Focal neurological deficits  CAP REFILL:  Normal  MENTAL STATUS:  Normal    Exam done by Karlton Lemon, MD on 03/13/21 at 12:01 PM      LABS     Recent Labs   Lab 03/13/21  0601 03/11/21  2101   WBC 8.68 10.55*   RBC 4.20 4.35   Hgb 12.7 13.1   Hematocrit 39.0 40.2   MCV 92.9 92.4   Platelets 150 165       Recent Labs   Lab 03/13/21  0601 03/11/21  2101   Sodium 141 143   Potassium 3.9 3.8   Chloride 107 106   CO2 27 26   BUN 13.0 13.0   Creatinine 0.8 0.8   Glucose 124* 86   Calcium 8.6 9.3       Recent Labs   Lab 03/11/21  2101   ALT 25   AST (SGOT) 21   Bilirubin, Total 0.8   Albumin 4.0   Alkaline Phosphatase 124*       Recent Labs   Lab 03/12/21  1229 03/11/21  2348   hs Troponin-I 3.8 4.1   hs Troponin-I Delta calc n/a 0.1             Microbiology Results (last 15 days)       Procedure Component Value Units Date/Time  COVID-19 (SARS-CoV-2) only ([facility]) asymptomatic admission - Hospitals [[id number]] Collected: 03/12/21 0551    Order Status: Completed Specimen:  Nasopharyngeal Updated: 03/12/21 0619     Purpose of COVID testing Screening     SARS-CoV-2 Specimen Source Nasal Swab     SARS CoV 2 Overall Result Not Detected     Comment: __________________________________________________  -A result of "Detected" indicates POSITIVE for the    presence of SARS CoV-2 RNA  -A result of "Not Detected" indicates NEGATIVE for the    presence of SARS CoV-2 RNA  __________________________________________________________  Test performed using the Roche cobas Liat SARS-CoV-2 assay. This assay is  only for use under the Food and Drug Administration's Emergency Use  Authorization. This is a real-time RT-PCR assay for the qualitative  detection of SARS-CoV-2 RNA. Viral nucleic acids may persist in vivo,  independent of viability. Detection of viral nucleic acid does not imply the  presence of infectious virus, or that virus nucleic acid is the cause of  clinical symptoms. Negative results do not preclude SARS-CoV-2 infection and  should not be used as the sole basis for diagnosis, treatment or other  patient management decisions. Negative results must be combined with  clinical observations, patient history, and/or epidemiological information.  Invalid results may be due to inhibiting substances in the specimen and  recollection should occur. Please see Fact Sheets for patients and providers  located:  WirelessDSLBlog.no         Narrative:      o Collect and clearly label specimen type:  o PREFERRED-Upper respiratory specimen: One Nasal Swab in  Transport Media.  o Hand deliver to laboratory ASAP  Indication for testing->Extended care facility admission to  semi private room  Screening             RADIOLOGY     Upon my review:    Signed,  Karlton Lemon, MD  12:01 PM 03/13/2021

## 2021-03-13 NOTE — Plan of Care (Signed)
Problem: Safety  Goal: Patient will be free from injury during hospitalization  Outcome: Progressing  Flowsheets (Taken 03/13/2021 1318)  Patient will be free from injury during hospitalization:   Assess patient's risk for falls and implement fall prevention plan of care per policy   Provide and maintain safe environment   Use appropriate transfer methods   Ensure appropriate safety devices are available at the bedside   Hourly rounding     Problem: Pain  Goal: Pain at adequate level as identified by patient  Outcome: Progressing  Flowsheets (Taken 03/13/2021 1318)  Pain at adequate level as identified by patient:   Identify patient comfort function goal   Assess for risk of opioid induced respiratory depression, including snoring/sleep apnea. Alert healthcare team of risk factors identified.   Assess pain on admission, during daily assessment and/or before any "as needed" intervention(s)   Evaluate patient's satisfaction with pain management progress   Reassess pain within 30-60 minutes of any procedure/intervention, per Pain Assessment, Intervention, Reassessment (AIR) Cycle     Problem: Nutrition  Goal: Nutritional intake is adequate  Outcome: Progressing  Flowsheets (Taken 03/13/2021 1318)  Nutritional intake is adequate: Allow adequate time for meals

## 2021-03-13 NOTE — Plan of Care (Signed)
Pt alert and oriented x4. RA. NSR. Reports chest pain. Medicated with Ativan and Morphine IV. Denies dizziness or SOB. Independent with ambulation. Cont to monitor and assess.     Problem: Hemodynamic Status: Cardiac  Goal: Stable vital signs and fluid balance  Outcome: Progressing  Flowsheets (Taken 03/13/2021 0500)  Stable vital signs and fluid balance:   Monitor/assess vital signs and telemetry per unit protocol   Assess signs and symptoms associated with cardiac rhythm changes   Weigh on admission and record weight daily   Monitor intake/output per unit protocol and/or LIP order   Monitor lab values   Monitor for leg swelling/edema and report to LIP if abnormal     Problem: Inadequate Tissue Perfusion  Goal: Adequate tissue perfusion will be maintained  Outcome: Progressing  Flowsheets (Taken 03/13/2021 0500)  Adequate tissue perfusion will be maintained:   Monitor/assess vital signs   Monitor/assess lab values and report abnormal values   Monitor/assess neurovascular status (pulses, capillary refill, pain, paresthesia, paralysis, presence of edema)   Monitor/assess for signs of VTE (edema of calf/thigh redness, pain)   Monitor intake and output   Assess and monitor skin integrity   Provide wound/skin care

## 2021-03-14 ENCOUNTER — Inpatient Hospital Stay: Payer: No Typology Code available for payment source

## 2021-03-14 LAB — CBC
Absolute NRBC: 0 10*3/uL (ref 0.00–0.00)
Hematocrit: 37.7 % (ref 34.7–43.7)
Hgb: 12.3 g/dL (ref 11.4–14.8)
MCH: 30.7 pg (ref 25.1–33.5)
MCHC: 32.6 g/dL (ref 31.5–35.8)
MCV: 94 fL (ref 78.0–96.0)
MPV: 13.1 fL — ABNORMAL HIGH (ref 8.9–12.5)
Nucleated RBC: 0 /100 WBC (ref 0.0–0.0)
Platelets: 148 10*3/uL (ref 142–346)
RBC: 4.01 10*6/uL (ref 3.90–5.10)
RDW: 14 % (ref 11–15)
WBC: 9.36 10*3/uL (ref 3.10–9.50)

## 2021-03-14 LAB — GFR: EGFR: >60

## 2021-03-14 LAB — BASIC METABOLIC PANEL
Anion Gap: 9 (ref 5.0–15.0)
BUN: 14 mg/dL (ref 7.0–21.0)
CO2: 23 mEq/L (ref 17–29)
Calcium: 8.7 mg/dL (ref 8.5–10.5)
Chloride: 107 mEq/L (ref 99–111)
Creatinine: 0.8 mg/dL (ref 0.4–1.0)
Glucose: 120 mg/dL — ABNORMAL HIGH (ref 70–100)
Potassium: 3.9 mEq/L (ref 3.5–5.3)
Sodium: 139 mEq/L (ref 135–145)

## 2021-03-14 LAB — APTT: PTT: 42 s — ABNORMAL HIGH (ref 27–39)

## 2021-03-14 LAB — PT/INR
PT INR: 1.1 (ref 0.9–1.1)
PT: 12.6 s (ref 10.1–12.9)

## 2021-03-14 MED ORDER — MORPHINE SULFATE 2 MG/ML IJ/IV SOLN (WRAP)
2.0000 mg | Freq: Once | Status: AC
Start: 2021-03-14 — End: 2021-03-14
  Administered 2021-03-14: 2 mg via INTRAVENOUS
  Filled 2021-03-14: qty 1

## 2021-03-14 MED ORDER — MORPHINE SULFATE 15 MG PO TABS
7.5000 mg | ORAL_TABLET | Freq: Four times a day (QID) | ORAL | Status: DC | PRN
Start: 2021-03-14 — End: 2021-03-15
  Administered 2021-03-14 – 2021-03-15 (×3): 7.5 mg via ORAL
  Filled 2021-03-14 (×3): qty 1

## 2021-03-14 MED ORDER — TECHNETIUM TC 99M ALBUMIN AGGREGATED
5.0000 | Freq: Once | Status: AC | PRN
Start: 2021-03-14 — End: 2021-03-14
  Administered 2021-03-14: 5 via INTRAVENOUS

## 2021-03-14 NOTE — Plan of Care (Signed)
NURSING SHIFT NOTE     Patient: [name] Cook  Day: 2      SHIFT EVENTS     Shift Narrative/Significant Events (PRN med administration, fall, RRT, etc.):     Pt complained of 10/10 left sided chest pain, pain is not new and has been ongoing. Pt had no PRN orders, covering MD paged and notified, PRN order for morphine placed and administered. Pt stated pain level goes down to about 5/10 after being medicated.  Pt told this RN she is allergic to flexeril, when it was offered to her. Pt stated that it causes her to have throat swelling. Order for flexeril discontinued and Pt's allergy list updated. Covering MD made aware.    Safety and fall precautions remain in place. Purposeful rounding completed.          ASSESSMENT     Changes in assessment from patient's baseline this shift:    Neuro: No  CV: No  Pulm: No  Peripheral Vascular: No  HEENT: No  GI: No  BM during shift: No, Last BM: Last BM Date: 03/11/21  GU: No   Integ: No  MS: No    Pain: Improved  Pain Interventions: Medications and Rest  Medications Utilized: morphine intravenous    Mobility: PMP Activity: Step 6 - Walks in Room of Distance Walked (ft) (Step 6,7): 0 Feet           Lines     Patient Lines/Drains/Airways Status       Active Lines, Drains and Airways       Name Placement date Placement time Site Days    Peripheral IV 03/11/21 20 G Right Upper Arm 03/11/21  2047  Upper Arm  2                         VITAL SIGNS     Vitals:    03/13/21 2356   BP: 113/71   Pulse: 78   Resp: 18   Temp: 98.1 F (36.7 C)   SpO2: 97%       Temp  Min: 97.5 F (36.4 C)  Max: 98.6 F (37 C)  Pulse  Min: 64  Max: 80  Resp  Min: 16  Max: 18  BP  Min: 105/67  Max: 144/83  SpO2  Min: 94 %  Max: 99 %      Intake/Output Summary (Last 24 hours) at 03/14/2021 0122  Last data filed at 03/13/2021 1159  Gross per 24 hour   Intake 300 ml   Output --   Net 300 ml          CARE PLAN        Problem: Pain  Goal: Pain at adequate level as identified by patient  Outcome: Progressing  Flowsheets  (Taken 03/14/2021 0121)  Pain at adequate level as identified by patient:   Identify patient comfort function goal   Assess for risk of opioid induced respiratory depression, including snoring/sleep apnea. Alert healthcare team of risk factors identified.   Assess pain on admission, during daily assessment and/or before any "as needed" intervention(s)   Reassess pain within 30-60 minutes of any procedure/intervention, per Pain Assessment, Intervention, Reassessment (AIR) Cycle   Evaluate if patient comfort function goal is met   Evaluate patient's satisfaction with pain management progress   Offer non-pharmacological pain management interventions     Problem: Hemodynamic Status: Cardiac  Goal: Stable vital signs and fluid balance  Outcome: Progressing  Flowsheets (  Taken 03/14/2021 0121)  Stable vital signs and fluid balance:   Monitor/assess vital signs and telemetry per unit protocol   Assess signs and symptoms associated with cardiac rhythm changes   Monitor intake/output per unit protocol and/or LIP order   Monitor for leg swelling/edema and report to LIP if abnormal   Monitor lab values

## 2021-03-14 NOTE — UM Notes (Signed)
** This review is compiled from documentation provided by the treatment team within the patient's medical record. **     Krystal Eaton, RN, BSN, ACM  Clinical Case Manager - Utilization Review    [facility]   [address]  [address]  [place], [place], [address]    NPI: [id number]  Tax ID: [id number]  Fax Number: [phone]  Insurance Line [place]: [phone]  Confidential VM: [phone]  [email or web address]      Please use fax number or insurance line to provide authorization for hospital services or to request additional information.       [facility]   (IAH) [facility]   (IFH) [facility]   [facility]) [facility] [place] [facility]   [facility]) [facility]  [facility])   [address]  [place], [place] [address] [address]  [address] [place], [place] [address] [address]  [place], [place] [address] [phone] [facility].  [place], [place] [address] [address]  [place], [place] [address]   NPI: [id number]  Tax ID: [id number] NPI: [id number]  Tax ID: [id number] NPI: [id number]  Tax ID: [id number] NPI: [id number]  Tax ID: [id number] NPI: [id number]  Tax ID: [id number]         Clinical Review 12/30-1/1    [name] Cook  Female, 46 y.o., [birth year]        ED Triage Vitals [03/11/21 1945]   Enc Vitals Group      BP (!) 197/114      Heart Rate 69      Resp Rate 21      Temp 97.9 F (36.6 C)      Temp Source Oral      SpO2 100 %      Weight 136.5 kg (301 lb)      Height 1.778 m (5\' 10" )      Pain Score 0       Chief Complaint   Patient presents with    Chest Pain    Facial Droop       46 y.o. female presenting with facial droop numbness and weakness.  Woke up @ 8 a.m. and felt ok  Went back to bed and awoke a few hours later with facial droop  DR recommended ED however patient did not go right away  Then developed chest pain, n/v and numbness and weakness to R side so came to ED    Hx of TIA and stroke  HTN      Labs:     Comprehensive metabolic  panel [[id number]]  (Abnormal) Collected: 03/11/21 2101     Specimen: Blood Updated: 03/11/21 2143       Glucose 86 mg/dL         BUN 61.4 mg/dL         Creatinine 0.8 mg/dL         Sodium 431 mEq/L         Potassium 3.8 mEq/L         Chloride 106 mEq/L         CO2 26 mEq/L         Calcium 9.3 mg/dL         Protein, Total 8.3 g/dL         Albumin 4.0 g/dL         AST (SGOT) 21 U/L         ALT 25 U/L         Alkaline Phosphatase 124  U/L         Bilirubin, Total 0.8 mg/dL         Globulin 4.3 g/dL         Albumin/Globulin Ratio 0.9       Anion Gap 11.0     CBC and differential [[id number]]  (Abnormal) Collected: 03/11/21 2101     Specimen: Blood Updated: 03/11/21 2109       WBC 10.55 x10 3/uL         Hgb 13.1 g/dL         Hematocrit 84.1 %         Platelets 165 x10 3/uL        03/11/21 2105 -- -- -- 62 97 % -- -- 168/93 (Abnormal)   -- -- 124 (Abnormal)         NIH Stroke Score       Flowsheet Row Most Recent Value   Patient's calculated Stroke Score: 6 filed at 03/11/2021 2129       Meds:  LORazepam (ATIVAN) injection 1 mg  Dose: 1 mg  Freq: Once Route: IV  Start: 03/11/21 2140 End: 03/11/21 2205  Given x1      CT Head without Contrast    Result Date: 03/11/2021  1.  No acute findings. Ewing Schlein MD, MD  03/11/2021 8:35 PM    Chest AP Portable    Result Date: 03/11/2021  1.  No acute findings. Ewing Schlein MD, MD  03/11/2021 8:53 PM     MRA Head (intracranial) Without Contrast    Result Date: 03/12/2021  No evidence of an aneurysm, significant stenosis or major branch occlusion. Alric Seton MD, MD  03/12/2021 2:49 AM    MRA Neck WO Contrast    Result Date: 03/12/2021  No carotid or vertebral artery stenosis, dissection or occlusion within the neck. Stable study. Alric Seton MD, MD  03/12/2021 2:57 AM    MRI Brain WO Contrast    Result Date: 03/12/2021  No acute intracranial abnormality. 2 punctate foci of T2/FLAIR hyperintensity within the left centrum semiovale are again identified, and may be secondary to  chronic microvascular ischemic change for migraine. No significant interval change. Alric Seton MD, MD  03/12/2021 2:52 AM      Clinical Impression:   1. Right sided weakness    2. Facial droop    3. Sensory loss    4. Chest pain, unspecified type          03/12/21 0550  Admit to Inpatient  Once        Diagnosis: Facial Droop    Level of Care: Intermediate Care    Patient Class: Inpatient       References:    IAH Bed Placement Criteria    [facility] Bed Placement Criteria    [facility] Bed Placement Criteria    ILH Bed Placement Criteria    [facility] Bed Placement Criteria   Question Answer Comment   Admitting Physician Georgette Dover    Service: Medicine    Estimated Length of Stay > or = to 2 midnights    Tentative Discharge Plan? Home or Self Care    Does patient need telemetry? Yes    Telemetry type (separate Telemetry order is also required): Cardiac telemetry        03/12/21 0550       Patient Active Hospital Problem List:  Facial droop (05/11/2016)     Right sided weakness  Left sided chest pain  Accelerated HTN/Hypertensive urgency   -EKG nonischemic, high-sensitivity troponin negative, status post ischemic evaluation within the last year as well as MRI/MRA of the brain this admission negative for acute ischemia  -changes likely secondary to hypertensive urgency  -if no resolution of chest pain with bp control, check CTPE  -cont lisinopril, home HCTZ, amlodipine, add labetalol and hydralazine  - follow patient for allergic reactions since she has multiple allergies  -recommend outpt OSA eval given nocturnal elev in BPs        UTI  -IV cefriaxone     Morbid obesity  -pt walking to loose weight  -rec continued efforts as excess weight likely contributing      Nutrition  Low salt           Consult - Neuro:    Appears somewhat unhappy  Face twisted upon smiling - not moving as much on the left side  Reduced sensation to LT R face/R arm    Assessment & Plan:   Numerous recurrent episodes of neurological symptoms with  negative prior work ups with suspicion of functional exams (as today). MRI brain has never shown an acute infarction (multiple such evaluations since 2017).     Address hypertension and chest pain.  No further neurological work up at this time          03/12/21 0001 -- -- -- 83 94 % -- 16 194/88 -- -- 127 (Abnormal)   -- -- -- KCL   03/11/21 2200 -- -- -- 71 98 % -- -- 175/79 --             Meds:    Scheduled Meds:    amLODIPine, 5 mg, Oral, Daily  aspirin, 81 mg, Oral, Daily  atorvastatin, 40 mg, Oral, Daily  enoxaparin, 40 mg, Subcutaneous, Daily  levETIRAcetam, 500 mg, Oral, Q12H SCH  lidocaine, 1 patch, Transdermal, Q24H  lisinopril, 40 mg, Oral, Daily  multivitamin, 1 tablet, Oral, Daily  pantoprazole, 40 mg, Oral, QAM AC          GI cocktail x1    LORazepam (ATIVAN) injection 1 mg  Dose: 1 mg  Freq: Every 8 hours PRN Route: IV  PRN Reason: Anxiety  Start: 03/12/21 2229 End: 03/13/21 1916  Given x1    morphine injection 2 mg  Dose: 2 mg  Freq: Every 4 hours PRN Route: IV  PRN Reason: severe pain  PRN Comment: hold for sedation  Start: 03/13/21 2139 End: 03/15/21 2138  Given x1        ----Clinical Update 1/1:    Interval Summary:   1/1 didn't sleep well due to neighbor, still w l sided chest pain - chest pain usually subsides by now, morphine doesn't help, pt with many other drug allergies. Pain not reproducible    Patient Active Hospital Problem List:  Facial droop (05/11/2016)     Right sided weakness  Left sided chest pain  Accelerated HTN/Hypertensive urgency   -EKG nonischemic, high-sensitivity troponin negative, status post ischemic evaluation within the last year as well as MRI/MRA of the brain this admission negative for acute ischemia  -changes likely secondary to hypertensive urgency, possibly due to untreated OSA, also on ddx is pe  -since no resolution of chest pain with bp control, ddimer ordered and repeat troponin this am, if + will check CTPE w pre-treatment with benadryl and steroids  -cont  lisinopril, home HCTZ, amlodipine, add labetalol and hydralazine  - follow patient for allergic reactions since she has multiple  allergies  -recommend outpt OSA eval given nocturnal elev in BPs           Latest Reference Range & Units 03/13/21 12:40   hs Troponin-I SEE BELOW ng/L 3.0      Latest Reference Range & Units 03/13/21 12:40   D-Dimer 0.00 - 0.60 ug/mL FEU 0.89 (H)   (H): Data is abnormally high      Vitals:    03/13/21 1609 03/13/21 2010 03/13/21 2142 03/13/21 2356   BP: 111/69 105/67 118/69 113/71   Pulse: 64 72 76 78   Resp: 17 16  18    Temp: 97.7 F (36.5 C) 98.6 F (37 C)  98.1 F (36.7 C)   TempSrc: Oral Oral     SpO2: 95% 94%  97%   Weight:       Height:         Scheduled Meds:  Current Facility-Administered Medications   Medication Dose Route Frequency    amLODIPine  5 mg Oral Daily    aspirin  81 mg Oral Daily    atorvastatin  40 mg Oral Daily    enoxaparin  1 mg/kg Subcutaneous Q12H    hydrALAZINE  25 mg Oral Q8H SCH    labetalol  100 mg Oral Q12H SCH    levETIRAcetam  500 mg Oral Q12H SCH    lidocaine  1 patch Transdermal Q24H    lisinopril  40 mg Oral Daily    multivitamin  1 tablet Oral Daily    pantoprazole  40 mg Oral QAM AC     LORazepam (ATIVAN) injection 1 mg  Dose: 1 mg  Freq: Every 8 hours PRN Route: IV  PRN Reason: Anxiety  Start: 03/12/21 2229 End: 03/13/21 1916  Given x2    morphine injection 2 mg  Dose: 2 mg  Freq: Every 4 hours PRN Route: IV  PRN Reason: severe pain  PRN Comment: hold for sedation  Start: 03/13/21 2139 End: 03/15/21 2138  Given x4

## 2021-03-14 NOTE — Plan of Care (Signed)
NURSING SHIFT NOTE     Patient: [name] Cook  Day: 2      SHIFT EVENTS     Shift Narrative/Significant Events (PRN med administration, fall, RRT, etc.):     Patient is alert oriented x4. VSS in no respiratory distress. Morphine IV given for chest pain. Chest pain is unchanged. Prn ativan given as well. Call bell and personal items within reach will continue to monitor patient progression and implement plan of care    Safety and fall precautions remain in place. Purposeful rounding completed.          ASSESSMENT     Changes in assessment from patient's baseline this shift:    Neuro: No  CV: No  Pulm: No  Peripheral Vascular: No  HEENT: No  GI: No  BM during shift: No, Last BM: Last BM Date: 03/11/21  GU: No   Integ: No  MS: No    Pain: Improved  Pain Interventions: Medications  Medications Utilized: morphine intravenous    Mobility: PMP Activity: Step 6 - Walks in Room of Distance Walked (ft) (Step 6,7): 0 Feet           Lines     Patient Lines/Drains/Airways Status       Active Lines, Drains and Airways       Name Placement date Placement time Site Days    Peripheral IV 03/11/21 20 G Right Upper Arm 03/11/21  2047  Upper Arm  2                         VITAL SIGNS     Vitals:    03/14/21 1357   BP: 125/86   Pulse:    Resp:    Temp:    SpO2:        Temp  Min: 97.7 F (36.5 C)  Max: 98.6 F (37 C)  Pulse  Min: 64  Max: 101  Resp  Min: 16  Max: 18  BP  Min: 105/67  Max: 129/86  SpO2  Min: 94 %  Max: 100 %    No intake or output data in the 24 hours ending 03/14/21 1423             CARE PLAN     Problem: Safety  Goal: Patient will be free from injury during hospitalization  Outcome: Progressing  Flowsheets (Taken 03/14/2021 1421)  Patient will be free from injury during hospitalization:   Assess patient's risk for falls and implement fall prevention plan of care per policy   Provide and maintain safe environment   Ensure appropriate safety devices are available at the bedside   Use appropriate transfer methods   Hourly  rounding     Problem: Pain  Goal: Pain at adequate level as identified by patient  Outcome: Progressing  Flowsheets (Taken 03/14/2021 1421)  Pain at adequate level as identified by patient:   Assess for risk of opioid induced respiratory depression, including snoring/sleep apnea. Alert healthcare team of risk factors identified.   Identify patient comfort function goal   Assess pain on admission, during daily assessment and/or before any "as needed" intervention(s)   Reassess pain within 30-60 minutes of any procedure/intervention, per Pain Assessment, Intervention, Reassessment (AIR) Cycle   Evaluate patient's satisfaction with pain management progress   Offer non-pharmacological pain management interventions     Problem: Nutrition  Goal: Nutritional intake is adequate  Outcome: Progressing  Flowsheets (Taken 03/13/2021 1318)  Nutritional intake is  adequate: Allow adequate time for meals

## 2021-03-14 NOTE — Progress Notes (Signed)
[facility]  PROGRESS NOTE      Patient: [name] Cook  Date: 03/14/2021   LOS: 2 Days  Admission Date: 03/11/2021   MRN: [id number]  Attending: Garlan Fillers, MD  Please contact me on SecureChat       ASSESSMENT/PLAN     [name] is a 46 y.o. female admitted with Facial droop    Interval Summary:     Patient Active Hospital Problem List:    Chest pain with elevated D-dimer  Check VQ scan  Check ultrasound of the leg  Started on full dose Lovenox 1 mg/kg every 12 hours until results of the VQ scan come back  Change pain medication to p.o. morphine patient has multiple allergies and unable to tolerate any other pain medication  Troponin is negative x3  EKG nonischemic  Stress test shows normal stress and rest myocardial perfusion study without evidence of inducible ischemia or scar normal left ventricular function with EF of 66% on 09/18/2017.     facial droop with right-sided weakness  MRI of the brain MRA head and neck negative for acute finding  Appreciate neurology input no further inpatient neurological studies required while inpatient    Obesity class III  BMI 46  Lifestyle modification    Hypertensive urgency  Continue lisinopril hydrochlorothiazide and amlodipine.  Added labetalol and hydralazine with good effect.    Nutrition: Cardiac diet    DVT Prophylaxis: Lovenox        Patient has BMI=Body mass index is 46.76 kg/m.  Diagnosis: Obesity Class 3 (formerly known as Morbid Obesity) based on BMI criteria    Code Status: Full code    Dispo: TBD likely in the next 24 hours if the VQ scan is negative         SUBJECTIVE     [name] states that she still having some chest pain on and off denies nausea vomiting abdominal pain    MEDICATIONS     Current Facility-Administered Medications   Medication Dose Route Frequency    amLODIPine  5 mg Oral Daily    aspirin  81 mg Oral Daily    atorvastatin  40 mg Oral Daily    enoxaparin  1 mg/kg Subcutaneous Q12H    hydrALAZINE  25 mg Oral Q8H SCH    labetalol   100 mg Oral Q12H SCH    levETIRAcetam  500 mg Oral Q12H SCH    lidocaine  1 patch Transdermal Q24H    lisinopril  40 mg Oral Daily    multivitamin  1 tablet Oral Daily    pantoprazole  40 mg Oral QAM AC       PHYSICAL EXAM     Vitals:    03/14/21 1608   BP: 116/70   Pulse: 83   Resp: 17   Temp: 98.8 F (37.1 C)   SpO2: 95%       Temperature: Temp  Min: 97.9 F (36.6 C)  Max: 98.8 F (37.1 C)  Pulse: Pulse  Min: 65  Max: 101  Respiratory: Resp  Min: 16  Max: 18  Non-Invasive BP: BP  Min: 105/67  Max: 125/86  Pulse Oximetry SpO2  Min: 94 %  Max: 100 %    Intake and Output Summary (Last 24 hours) at Date Time  No intake or output data in the 24 hours ending 03/14/21 1629  GEN APPEARANCE: Normal;  A&OX3  HEENT: PERLA; EOMI; Conjunctiva Clear  NECK: Supple; No bruits  CVS: RRR, S1, S2; No  M/G/R  LUNGS: CTAB; No Wheezes; No Rhonchi: No rales  ABD: Soft; No TTP; + Normoactive BS  EXT: No edema; Pulses 2+ and intact  Skin exam:  pink  NEURO: CN 2-12 intact; No Focal neurological deficits  CAP REFILL:  Normal  MENTAL STATUS:  Normal    Exam done by Garlan Fillers, MD on 03/14/2021 at 5:06 PM     LABS     Recent Labs   Lab 03/14/21  0552 03/13/21  0601 03/11/21  2101   WBC 9.36 8.68 10.55*   RBC 4.01 4.20 4.35   Hgb 12.3 12.7 13.1   Hematocrit 37.7 39.0 40.2   MCV 94.0 92.9 92.4   Platelets 148 150 165       Recent Labs   Lab 03/14/21  0552 03/13/21  0601 03/11/21  2101   Sodium 139 141 143   Potassium 3.9 3.9 3.8   Chloride 107 107 106   CO2 23 27 26    BUN 14.0 13.0 13.0   Creatinine 0.8 0.8 0.8   Glucose 120* 124* 86   Calcium 8.7 8.6 9.3       Recent Labs   Lab 03/11/21  2101   ALT 25   AST (SGOT) 21   Bilirubin, Total 0.8   Albumin 4.0   Alkaline Phosphatase 124*       Recent Labs   Lab 03/13/21  1240 03/12/21  1229   hs Troponin-I 3.0 3.8   hs Troponin-I Delta  --  calc n/a       Recent Labs   Lab 03/14/21  0552   PT INR 1.1   PT 12.6   PTT 42*       Microbiology Results (last 15 days)       Procedure Component Value  Units Date/Time    COVID-19 (SARS-CoV-2) only ([facility]) asymptomatic admission - Hospitals [[id number]] Collected: 03/12/21 0551    Order Status: Completed Specimen: Nasopharyngeal Updated: 03/12/21 0619     Purpose of COVID testing Screening     SARS-CoV-2 Specimen Source Nasal Swab     SARS CoV 2 Overall Result Not Detected     Comment: __________________________________________________  -A result of "Detected" indicates POSITIVE for the    presence of SARS CoV-2 RNA  -A result of "Not Detected" indicates NEGATIVE for the    presence of SARS CoV-2 RNA  __________________________________________________________  Test performed using the Roche cobas [facility] SARS-CoV-2 assay. This assay is  only for use under the Food and Drug Administration's Emergency Use  Authorization. This is a real-time RT-PCR assay for the qualitative  detection of SARS-CoV-2 RNA. Viral nucleic acids may persist in vivo,  independent of viability. Detection of viral nucleic acid does not imply the  presence of infectious virus, or that virus nucleic acid is the cause of  clinical symptoms. Negative results do not preclude SARS-CoV-2 infection and  should not be used as the sole basis for diagnosis, treatment or other  patient management decisions. Negative results must be combined with  clinical observations, patient history, and/or epidemiological information.  Invalid results may be due to inhibiting substances in the specimen and  recollection should occur. Please see Fact Sheets for patients and providers  located:  WirelessDSLBlog.no         Narrative:      o Collect and clearly label specimen type:  o PREFERRED-Upper respiratory specimen: One Nasal Swab in  Transport Media.  o Hand deliver to laboratory ASAP  Indication for  testing->Extended care facility admission to  semi private room  Screening             RADIOLOGY       Garlan Fillers, MD   4:29 PM 03/14/2021

## 2021-03-14 NOTE — Progress Notes (Signed)
Ms Nichter's right IV was leaking blood when she arrived. I drew back, got good blood return, but when I flushed it, it leaked down her arm. I removed the dressing, threaded the IV back in, and tried to flush, and blood/saline sprayed out of the catheter, close to the hub. I removed the IV and documented it in Epic.

## 2021-03-15 ENCOUNTER — Inpatient Hospital Stay: Payer: No Typology Code available for payment source

## 2021-03-15 LAB — CBC
Absolute NRBC: 0 10*3/uL (ref 0.00–0.00)
Hematocrit: 41.3 % (ref 34.7–43.7)
Hgb: 13.2 g/dL (ref 11.4–14.8)
MCH: 30.3 pg (ref 25.1–33.5)
MCHC: 32 g/dL (ref 31.5–35.8)
MCV: 94.7 fL (ref 78.0–96.0)
MPV: 12.8 fL — ABNORMAL HIGH (ref 8.9–12.5)
Nucleated RBC: 0 /100 WBC (ref 0.0–0.0)
Platelets: 161 10*3/uL (ref 142–346)
RBC: 4.36 10*6/uL (ref 3.90–5.10)
RDW: 14 % (ref 11–15)
WBC: 9.77 10*3/uL — ABNORMAL HIGH (ref 3.10–9.50)

## 2021-03-15 MED ORDER — ENOXAPARIN SODIUM 40 MG/0.4ML IJ SOSY
40.0000 mg | PREFILLED_SYRINGE | INTRAMUSCULAR | Status: DC
Start: 2021-03-16 — End: 2021-03-15

## 2021-03-15 MED ORDER — LABETALOL HCL 100 MG PO TABS
100.0000 mg | ORAL_TABLET | Freq: Two times a day (BID) | ORAL | 0 refills | Status: AC
Start: 2021-03-15 — End: 2021-04-14

## 2021-03-15 MED ORDER — HYDRALAZINE HCL 25 MG PO TABS
25.0000 mg | ORAL_TABLET | Freq: Three times a day (TID) | ORAL | 0 refills | Status: AC
Start: 2021-03-15 — End: 2021-04-14

## 2021-03-15 NOTE — Progress Notes (Signed)
Patient discharged to home via wheelchair accompanied by hospital transport team with all the belongings pt came with. Discharge instructions and follow up plan explained to pt and she demonstrated understanding, no further questions at this time. IV line removed, Latest VS stable.

## 2021-03-15 NOTE — UM Notes (Signed)
[facility] NPI: [id number], Tax ID: [id number] Please Call [phone] with any questions or concerns. Confidential voicemail. Fax final authorizations and requests for additional information to [phone]        CSR 1/2:  still having some chest pain on and off denies nausea vomiting abdominal pain  States pain 5-7/10  CVS: RRR, S1, S2; No M/G/R  LUNGS: CTAB; No Wheezes; No Rhonchi: No rales  ABD: Soft; No TTP; + Normoactive BS  EXT: No edema; Pulses 2+ and intact  Skin exam:  pink  NEURO: CN 2-12 intact; No Focal neurological deficits    VS: 98.8-101-18 125/86 sats 94    Labs: Glu 120, PTT 42,     NM Lung Scan:  No evidence for a mismatch segmental perfusion abnormality. No evidence for pulmonary embolism    Scheduled Meds:  Current Facility-Administered Medications  Medication Dose Route Frequency   amLODIPine  5 mg Oral Daily   aspirin  81 mg Oral Daily   atorvastatin  40 mg Oral Daily   [START ON 03/16/2021] enoxaparin  40 mg Subcutaneous Q24H SCH   hydrALAZINE  25 mg Oral Q8H SCH   labetalol  100 mg Oral Q12H SCH   levETIRAcetam  500 mg Oral Q12H SCH   lidocaine  1 patch Transdermal Q24H   lisinopril  40 mg Oral Daily   multivitamin  1 tablet Oral Daily   pantoprazole  40 mg Oral QAM AC    Continuous Infusions:  PRN Meds:.albuterol sulfate HFA, alum & mag hydroxide-simethicone, Nursing communication: Adult Hypoglycemia Treatment Algorithm **AND** dextrose **AND** dextrose **AND** dextrose **AND** glucagon (rDNA), famotidine, lidocaine viscous, LORazepam, magnesium sulfate, meclizine, melatonin, morphine, naloxone, ondansetron **OR** ondansetron    PRNs Given:  Valium 5mg  IV - LD 12/31 @ 0030 - total doses rec'd - 1  Ativan 1mg  IV - LD 1/2 @ 1357 - total doses rec'd - 6  MSO4 2mg  IV - LD 1/2 @ 1500 - total doses rec'd - 9  MSIR 7.5mg  po - LD 1/2 @ 2251 - total doses rec'd - 1    Assessment/Plan:  Chest pain with elevated D-dimer  Check VQ scan  Check ultrasound of the  leg  Started on full dose Lovenox 1 mg/kg every 12 hours until results of the VQ scan come back  Change pain medication to p.o. morphine patient has multiple allergies and unable to tolerate any other pain medication  Troponin is negative x3  EKG nonischemic  Stress test shows normal stress and rest myocardial perfusion study without evidence of inducible ischemia or scar normal left ventricular function with EF of 66% on 09/18/2017.      facial droop with right-sided weakness  MRI of the brain MRA head and neck negative for acute finding  Appreciate neurology input no further inpatient neurological studies required while inpatient     Obesity class III  BMI 46  Lifestyle modification     Hypertensive urgency  Continue lisinopril hydrochlorothiazide and amlodipine.  Added labetalol and hydralazine with good effect.     Nutrition: Cardiac diet     DVT Prophylaxis: Lovenox        Patient has BMI=Body mass index is 46.76 kg/m.  Diagnosis: Obesity Class 3 (formerly known as Morbid Obesity) based on BMI criteria     Code Status: Full code       CSR 1/1:  didn't sleep well due to neighbor, still w l sided chest pain - chest pain usually subsides by now, morphine  doesn't help, pt with many other drug allergies. Pain not reproducible  States chest pain 5-10/10  CVS: RRR, S1, S2; No M/G/R chest pain not reproducible  LUNGS: CTAB; No Wheezes; No Rhonchi: No rales  ABD: obese, Soft; No TTP; + Normoactive BS  EXT: No edema; Pulses 2+ and intact strength to rle 4/5 rue 4/5    VS: 98.2-89-18 164/84 sats 92    Labs: Glu 124, D-dimer 0.89    PRNs Given:  Valium 5mg  IV - LD 12/31 @ 0030 - total doses rec'd - 1  Ativan 1mg  IV - LD 1/1 @ 1850 - total doses rec'd - 4  MSO4 2mg  IV - LD 1/1 @ 2206 - total doses rec'd - 5      Assessment/Plan:  Facial droop (05/11/2016)     Right sided weakness  Left sided chest pain  Accelerated HTN/Hypertensive urgency   -EKG nonischemic, high-sensitivity troponin negative, status post ischemic evaluation  within the last year as well as MRI/MRA of the brain this admission negative for acute ischemia  -changes likely secondary to hypertensive urgency, possibly due to untreated OSA, also on ddx is pe  -since no resolution of chest pain with bp control, ddimer ordered and repeat troponin this am, if + will check CTPE w pre-treatment with benadryl and steroids  -cont lisinopril, home HCTZ, amlodipine, add labetalol and hydralazine  - follow patient for allergic reactions since she has multiple allergies  -recommend outpt OSA eval given nocturnal elev in BPs        Morbid obesity  -pt walking to loose weight  -rec continued efforts as excess weight likely contributing      Nutrition  Low salt      DVT/VTE Prophylaxis lovenox

## 2021-03-15 NOTE — Progress Notes (Signed)
03/15/21 1656   Discharge Disposition   Patient preference/choice provided? Yes   Physical Discharge Disposition Home   Mode of Transportation Car   Patient/Family/POA notified of transfer plan Yes   Bedside nurse notified of transport plan? Yes   CM Interventions   Multidisciplinary rounds/family meeting before d/c? Yes   Medicare Checklist   Is this a Medicare patient? No

## 2021-03-15 NOTE — Discharge Instr - AVS First Page (Addendum)
[facility] DISCHARGE INSTRUCTIONS     Date of Admission: 03/11/2021    Date of Discharge: 03/15/2021    Discharge Physician: Garlan Fillers, MD    Dear [name] Cook,     Thank you for choosing [facility] for your emergency care needs. We strive to provide EXCELLENT care to you and your family.     In an effort to explain clearly why you were here in the hospital, I've written a very brief summary. I hope that you find it useful. Other details including formal diagnosis, medication changes, follow up appointment recommendations, and access to MyChart for formal medical records can be found in this packet.       You were admitted for Facial droop for which you MRI of the brain which was negative for acute stroke, your blood pressure is uncontrolled we added blood pressure medication hydralazine and labetalol with good response prescription has been sent to your pharmacy.    Please come back to the hospital difficulty breathing intractable nausea vomiting excessive fatigue or any concerning symptoms.        Make sure to follow up with our  Discharge Clinic or Letitia Libra, MD your primary care doctor for follow-up. I cannot stress the importance of follow up enough.    Make sure to bring the following to your doctors appointments:  Medications in their original bottles  Glucometer/blood sugar log (if diabetic)   Weight log (if you have heart failure)    If you are unable to obtain an appointment, unable to obtain newly prescribed medications, or are unclear about any of your discharge instructions please contact me at [phone] (M-F, 8am-3pm) or weekends and after hours via the hospital operator [phone]) [phone], the hospital case manager, or your primary care physician.    Finally, as your discharging physician, you may be receiving a survey which is regarding my care. I would greatly value and appreciate your feedback as I strive for excellence.     Respectfully yours,    Garlan Fillers, MD  03/15/2021

## 2021-03-15 NOTE — Discharge Summary (Signed)
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 female with a PMHx of Right sided weakness,facial droop and chest pain 12/31. Last known well at 8 AM.  Patient states when she went to the restroom at that time she felt normal.  Patient went back to bed and when she woke up at 11 AM she noticed a facial droop.  Patient reports she experienced sudden onset right-sided hemiparesis followed by substernal chest pain radiating to the left arm.  She called her outpatient primary care provider who recommended her take her blood pressure, patient reports her blood pressure was 238/110.  Her primary care provider recommended she come into the emergency department.    Denies any headache, changes in vision.  Reports her chest pain is radiating down to her left arm, pressure in sensation, and constant in duration.  She reports only IV morphine improved her pain.  She has multiple other drug allergies to Tylenol, NSAIDs, all of which cause throat swelling and hives.     Patient has never been worked up for obstructive sleep apnea, she reports she is compliant with her lisinopril and hydrochlorothiazide, and she reports she has been trying to lose weight by walking.  She was  last [facility] with similar symptoms, underwent MRI at that time. Please note she has had multiple admissions for stroke and chest pain work-up in the past.  MRI of the C-spine shows minimal disease.  She has been followed inpatient by cardiology as well as neurology.      The emergency department she was found to have SBP was 197 in the hospital a leukocytosis of 10.55, creatinine normal at 0.8, alkaline phosphatase of 124, high-sensitivity troponin 3.8, that shows 6-10 white blood cells. EKG w nsr and LVH.       See HPI for details.    Hospital Course (3 Days)   Admitted to IMCU/neuro CT scan of the head negative for acute finding MRI of the brain negative for acute finding MRI angiogram head and neck negative neurology recommended follow-up as an outpatient for further testing given normal imaging patient had chest pain with elevated D-dimer she is allergic to contrast she did have VQ scan which was negative for PE ultrasound of the leg negative for PE is vitally stable in stable condition blood pressure medication has been adjusted added hydralazine and labetalol with good response and she will be discharged in stable condition she will need to follow-up as an outpatient for sleep study.      Procedures/Imaging:   Upon my review:   CT Head without Contrast    Result Date: 03/11/2021  1.  No acute findings. Ewing Schlein MD, MD  03/11/2021 8:35 PM    MRA Head (intracranial) Without Contrast    Result Date: 03/12/2021  No evidence of an aneurysm, significant stenosis or major branch occlusion. Alric Seton MD, MD  03/12/2021 2:49 AM    MRA Neck WO Contrast    Result Date: 03/12/2021  No carotid or vertebral artery stenosis, dissection or occlusion within the neck. Stable study. Alric Seton MD, MD  03/12/2021 2:57 AM    MRI Brain WO Contrast    Result Date: 03/12/2021  No acute intracranial abnormality. 2 punctate foci of T2/FLAIR hyperintensity within the left centrum semiovale are again  identified, and may be secondary to chronic microvascular ischemic change for migraine. No significant interval change. Alric Seton MD, MD  03/12/2021 2:52 AM    NM Lung Scan Perfusion Particulate    Result Date: 03/14/2021  No evidence for a mismatch segmental perfusion abnormality. No evidence for pulmonary embolism. Heron Nay, MD  03/14/2021 6:36 PM    Chest AP Portable    Result Date: 03/11/2021  1.  No acute findings. Ewing Schlein MD, MD  03/11/2021 8:53 PM    US Venous Low Extrem Duplx Dopp Comp Bilat    Result Date: 03/15/2021   No ultrasound evidence of DVT in either lower extremity. Suszanne Finch, MD  03/15/2021 3:38 PM    NM Spect CT Fusion Sgl Area Sgl Day    Result Date: 03/14/2021  No evidence for a mismatch segmental perfusion abnormality. No evidence for pulmonary embolism. Heron Nay, MD  03/14/2021 6:36 PM         Best Practices   Was the patient admitted with either a CHF Exacerbation or Pneumonia?  No     Progress Note/Physical Exam at Discharge     Subjective: Feeling better denies chest pain, shortness of breath, abdominal pain reported her only symptom is right facial droop    Vitals:    03/15/21 0807 03/15/21 1121 03/15/21 1435 03/15/21 1547   BP: 137/74 159/73 102/68 113/73   Pulse:  80 71 76   Resp:  16  14   Temp:    98.1 F (36.7 C)   TempSrc:    Oral   SpO2:  99%  96%   Weight:       Height:         General: NAD, AAOx3  HEENT: perrla, eomi, sclera anicteric, OP: Clear, MMM  Neck: supple, FROM, no LAD  Cardiovascular: RRR, no m/r/g  Lungs: CTAB, no w/r/r  Abdomen: soft, +BS, NT/ND, no masses, no g/r  Extremities: no C/C/E  Skin: no rashes or lesions noted  Neuro: CN 2-12 intact; No Focal neurological deficits       Diagnostics     Labs/Studies Pending at Discharge: No    Last Labs   Recent Labs   Lab 03/15/21  1155 03/14/21  0552 03/13/21  0601   WBC 9.77* 9.36 8.68   RBC 4.36 4.01 4.20   Hgb 13.2 12.3 12.7   Hematocrit 41.3 37.7 39.0   MCV 94.7 94.0 92.9   Platelets 161 148 150        Recent Labs   Lab 03/14/21  0552 03/13/21  0601 03/11/21  2101   Sodium 139 141 143   Potassium 3.9 3.9 3.8   Chloride 107 107 106   CO2 23 27 26    BUN 14.0 13.0 13.0   Creatinine 0.8 0.8  0.8   Glucose 120* 124* 86   Calcium 8.7 8.6 9.3       Microbiology Results (last 15 days)       Procedure Component Value Units Date/Time    COVID-19 (SARS-CoV-2) only (Liat Rapid) asymptomatic admission - Hospitals [[id number]] Collected: 03/12/21 0551    Order Status: Completed Specimen: Nasopharyngeal Updated: 03/12/21 0619     Purpose of COVID testing Screening     SARS-CoV-2 Specimen Source Nasal Swab     SARS CoV 2 Overall Result Not Detected     Comment: __________________________________________________  -A result of "Detected" indicates POSITIVE for the    presence of SARS CoV-2 RNA  -A result of "Not Detected" indicates NEGATIVE for the    presence of SARS CoV-2 RNA  __________________________________________________________  Test performed using the Roche cobas Liat SARS-CoV-2 assay. This assay is  only for use under the Food and Drug Administration's Emergency Use  Authorization. This is a real-time RT-PCR assay for the qualitative  detection of SARS-CoV-2 RNA. Viral nucleic acids may persist in vivo,  independent of viability. Detection of viral nucleic acid does not imply the  presence of infectious virus, or that virus nucleic acid is the cause of  clinical symptoms. Negative results do not preclude SARS-CoV-2 infection and  should not be used as the sole basis for diagnosis, treatment or other  patient management decisions. Negative results must be combined with  clinical observations, patient history, and/or epidemiological information.  Invalid results may be due to inhibiting substances in the specimen and  recollection should occur. Please see Fact Sheets for patients and providers  located:  WirelessDSLBlog.no         Narrative:      o Collect and clearly label specimen  type:  o PREFERRED-Upper respiratory specimen: One Nasal Swab in  Transport Media.  o Hand deliver to laboratory ASAP  Indication for testing->Extended care facility admission to  semi private room  Screening             Patient Instructions   Discharge Diet: Cardiac diet   Discharge Activity: as tolerated     Follow Up Appointment:   Follow-up Information       your primary care doctor. Schedule an appointment as soon as possible for a visit in 1 week(s).    Why: for after discharge follow up with your primary care doctor             Alway, Piedad Climes, MD. Schedule an appointment as soon as possible for a visit in 4 week(s).    Specialty: Neurology  Why: for after discharge follow up with neurology  Contact information:  [address]  [address] [place] [address]  [phone]               Letitia Libra, MD .    Specialty: Family Medicine  Contact information:  [address]  [address]  [place] [address]  [phone]                              Discharge Medications:     Medication List        START taking these medications      hydrALAZINE 25 MG tablet  Commonly known as: APRESOLINE  Take 1 tablet (25 mg) by mouth every 8 (eight) hours     labetalol 100 MG tablet  Commonly known as: NORMODYNE  Take 1 tablet (  100 mg) by mouth every 12 (twelve) hours            CONTINUE taking these medications      albuterol sulfate HFA 108 (90 Base) MCG/ACT inhaler  Commonly known as: PROVENTIL  Inhale 2 puffs into the lungs every 4 (four) hours as needed for Wheezing or Shortness of Breath     amLODIPine 5 MG tablet  Commonly known as: NORVASC  Take 1 tablet (5 mg total) by mouth daily     aspirin 81 MG chewable tablet  Chew 1 tablet (81 mg total) by mouth daily     atorvastatin 40 MG tablet  Commonly known as: LIPITOR  Take 1 tablet (40 mg total) by mouth daily     KEPPRA PO     lidocaine 5 %  Commonly known as: LIDODERM  Place 1 patch onto the skin every 24 hours Remove & Discard patch within 12 hours or as directed by  MD     lisinopril 40 MG tablet  Commonly known as: ZESTRIL  Take 1 tablet (40 mg total) by mouth daily     meclizine 25 MG tablet  Commonly known as: ANTIVERT  Take 1 tablet (25 mg total) by mouth 3 (three) times daily as needed for Nausea or Dizziness     multivitamin Tabs  Take 1 tablet by mouth daily     pantoprazole 40 MG tablet  Commonly known as: PROTONIX  Take 1 tablet (40 mg total) by mouth daily               Where to Get Your Medications        These medications were sent to [id number] - [place], [place] - [address]  [address], [place] [address]      Phone: [phone]   hydrALAZINE 25 MG tablet  labetalol 100 MG tablet          Time spent examining patient, discussing with patient/family regarding hospital course, chart review, reconciling medications and discharge planning: 35 minutes.    Signed,  Garlan Fillers, MD    4:15 PM 03/15/2021

## 2021-03-15 NOTE — Plan of Care (Signed)
Problem: Hemodynamic Status: Cardiac  Goal: Stable vital signs and fluid balance  Outcome: Progressing  Flowsheets (Taken 03/14/2021 0121 by Reece Levy A, RN)  Stable vital signs and fluid balance:   Monitor/assess vital signs and telemetry per unit protocol   Assess signs and symptoms associated with cardiac rhythm changes   Monitor intake/output per unit protocol and/or LIP order   Monitor for leg swelling/edema and report to LIP if abnormal   Monitor lab values     Problem: Inadequate Tissue Perfusion  Goal: Adequate tissue perfusion will be maintained  Outcome: Progressing  Flowsheets (Taken 03/15/2021 0741)  Adequate tissue perfusion will be maintained:   Monitor/assess vital signs   Monitor/assess lab values and report abnormal values   Monitor/assess neurovascular status (pulses, capillary refill, pain, paresthesia, paralysis, presence of edema)   Monitor intake and output   Monitor/assess for signs of VTE (edema of calf/thigh redness, pain)   Monitor for signs and symptoms of a pulmonary embolism (dyspnea, tachypnea, tachycardia, confusion)   VTE Prevention: Administer anticoagulant(s) and/or apply anti-embolism stockings/devices as ordered   Perform active/passive ROM   Increase mobility as tolerated/progressive mobility   Position patient for maximum circulation/cardiac output   Assess and monitor skin integrity     Problem: Ineffective Gas Exchange  Goal: Effective breathing pattern  Outcome: Progressing  Flowsheets (Taken 03/15/2021 0741)  Effective breathing pattern: Maintain O2 saturation level per LIP order     Problem: Impaired Mobility  Goal: Mobility/Activity is maintained at optimal level for patient  Outcome: Progressing  Flowsheets (Taken 03/15/2021 0741)  Mobility/activity is maintained at optimal level for patient:   Increase mobility as tolerated/progressive mobility   Encourage independent activity per ability   Maintain proper body alignment   Perform active/passive ROM   Plan activities to  conserve energy, plan rest periods   Pt had a non significant night. A&O X 4, VSS, SPO2 of 96% on RA and SR on the monitor. Pt c/o chest pain X 1 and PRN MS contin 7.5 mg PO Q6H given with effective outcome.   Pt ambulated to the BR a couple times and well tolerated.   No c/o SOB or chest pain post ambulation and VS remained stable. No seizure activities the whole shift.  VS, SPO2, Sz activities, Tele and chest pain monitoring continue.

## 2021-03-15 NOTE — Significant Event (Addendum)
Called [facility] for approx time U/S venous dopp, spoke with Shanda Bumps, have 2 patients to complete then Ms Defino will be next.

## 2021-03-15 NOTE — Plan of Care (Signed)
Problem: Safety  Goal: Patient will be free from injury during hospitalization  03/15/2021 1054 by Andrew Au, RN  Outcome: Progressing  Flowsheets (Taken 03/14/2021 1421 by Ewell Poe, RN)  Patient will be free from injury during hospitalization:   Assess patient's risk for falls and implement fall prevention plan of care per policy   Provide and maintain safe environment   Ensure appropriate safety devices are available at the bedside   Use appropriate transfer methods   Hourly rounding  03/15/2021 1053 by Andrew Au, RN  Outcome: Progressing  Flowsheets (Taken 03/14/2021 1421 by Ewell Poe, RN)  Patient will be free from injury during hospitalization:   Assess patient's risk for falls and implement fall prevention plan of care per policy   Provide and maintain safe environment   Ensure appropriate safety devices are available at the bedside   Use appropriate transfer methods   Hourly rounding     Problem: Pain  Goal: Pain at adequate level as identified by patient  03/15/2021 1054 by Andrew Au, RN  Outcome: Progressing  Flowsheets (Taken 03/14/2021 1421 by Ewell Poe, RN)  Pain at adequate level as identified by patient:   Assess for risk of opioid induced respiratory depression, including snoring/sleep apnea. Alert healthcare team of risk factors identified.   Identify patient comfort function goal   Assess pain on admission, during daily assessment and/or before any "as needed" intervention(s)   Reassess pain within 30-60 minutes of any procedure/intervention, per Pain Assessment, Intervention, Reassessment (AIR) Cycle   Evaluate patient's satisfaction with pain management progress   Offer non-pharmacological pain management interventions  03/15/2021 1053 by Andrew Au, RN  Outcome: Progressing  Flowsheets (Taken 03/14/2021 1421 by Ewell Poe, RN)  Pain at adequate level as identified by patient:   Assess for risk of opioid induced respiratory depression, including snoring/sleep apnea. Alert  healthcare team of risk factors identified.   Identify patient comfort function goal   Assess pain on admission, during daily assessment and/or before any "as needed" intervention(s)   Reassess pain within 30-60 minutes of any procedure/intervention, per Pain Assessment, Intervention, Reassessment (AIR) Cycle   Evaluate patient's satisfaction with pain management progress   Offer non-pharmacological pain management interventions     Problem: Hemodynamic Status: Cardiac  Goal: Stable vital signs and fluid balance  03/15/2021 1054 by Andrew Au, RN  Outcome: Progressing  Flowsheets (Taken 03/14/2021 0121 by Delorise Royals, RN)  Stable vital signs and fluid balance:   Monitor/assess vital signs and telemetry per unit protocol   Assess signs and symptoms associated with cardiac rhythm changes   Monitor intake/output per unit protocol and/or LIP order   Monitor for leg swelling/edema and report to LIP if abnormal   Monitor lab values  03/15/2021 1053 by Andrew Au, RN  Outcome: Progressing  Flowsheets (Taken 03/14/2021 0121 by Reece Levy A, RN)  Stable vital signs and fluid balance:   Monitor/assess vital signs and telemetry per unit protocol   Assess signs and symptoms associated with cardiac rhythm changes   Monitor intake/output per unit protocol and/or LIP order   Monitor for leg swelling/edema and report to LIP if abnormal   Monitor lab values     Problem: Nutrition  Goal: Nutritional intake is adequate  Outcome: Progressing

## 2021-07-29 ENCOUNTER — Observation Stay: Payer: Medicaid Managed Care Other

## 2021-07-29 ENCOUNTER — Other Ambulatory Visit: Payer: Self-pay

## 2021-07-29 ENCOUNTER — Emergency Department
Admission: EM | Admit: 2021-07-29 | Discharge status: Absent without leave | Payer: Medicaid Managed Care Other | Source: Home / Self Care

## 2021-07-29 ENCOUNTER — Observation Stay
Admission: EM | Admit: 2021-07-29 | Discharge: 2021-07-31 | Disposition: A | Discharge status: Home / Self Care | Payer: Medicaid Managed Care Other | Attending: Internal Medicine | Admitting: Internal Medicine

## 2021-07-29 ENCOUNTER — Emergency Department: Payer: Medicaid Managed Care Other

## 2021-07-29 DIAGNOSIS — I10 Essential (primary) hypertension: Secondary | ICD-10-CM | POA: Diagnosis present

## 2021-07-29 DIAGNOSIS — Z538 Procedure and treatment not carried out for other reasons: Secondary | ICD-10-CM | POA: Insufficient documentation

## 2021-07-29 DIAGNOSIS — Z6841 Body Mass Index (BMI) 40.0 and over, adult: Secondary | ICD-10-CM | POA: Insufficient documentation

## 2021-07-29 DIAGNOSIS — Z7982 Long term (current) use of aspirin: Secondary | ICD-10-CM | POA: Insufficient documentation

## 2021-07-29 DIAGNOSIS — Z713 Dietary counseling and surveillance: Secondary | ICD-10-CM | POA: Insufficient documentation

## 2021-07-29 DIAGNOSIS — I160 Hypertensive urgency: Secondary | ICD-10-CM | POA: Insufficient documentation

## 2021-07-29 DIAGNOSIS — R079 Chest pain, unspecified: Secondary | ICD-10-CM | POA: Insufficient documentation

## 2021-07-29 DIAGNOSIS — R202 Paresthesia of skin: Secondary | ICD-10-CM | POA: Diagnosis present

## 2021-07-29 DIAGNOSIS — G40909 Epilepsy, unspecified, not intractable, without status epilepticus: Secondary | ICD-10-CM | POA: Insufficient documentation

## 2021-07-29 DIAGNOSIS — G44209 Tension-type headache, unspecified, not intractable: Secondary | ICD-10-CM | POA: Diagnosis present

## 2021-07-29 DIAGNOSIS — R569 Unspecified convulsions: Secondary | ICD-10-CM | POA: Diagnosis present

## 2021-07-29 DIAGNOSIS — Z79899 Other long term (current) drug therapy: Secondary | ICD-10-CM | POA: Insufficient documentation

## 2021-07-29 DIAGNOSIS — R519 Headache, unspecified: Secondary | ICD-10-CM | POA: Insufficient documentation

## 2021-07-29 DIAGNOSIS — Z20822 Contact with and (suspected) exposure to covid-19: Secondary | ICD-10-CM | POA: Insufficient documentation

## 2021-07-29 DIAGNOSIS — R200 Anesthesia of skin: Secondary | ICD-10-CM | POA: Insufficient documentation

## 2021-07-29 DIAGNOSIS — R29810 Facial weakness: Principal | ICD-10-CM | POA: Insufficient documentation

## 2021-07-29 DIAGNOSIS — R531 Weakness: Secondary | ICD-10-CM

## 2021-07-29 DIAGNOSIS — E7800 Pure hypercholesterolemia, unspecified: Secondary | ICD-10-CM | POA: Insufficient documentation

## 2021-07-29 DIAGNOSIS — Z8673 Personal history of transient ischemic attack (TIA), and cerebral infarction without residual deficits: Secondary | ICD-10-CM | POA: Insufficient documentation

## 2021-07-29 DIAGNOSIS — R0602 Shortness of breath: Secondary | ICD-10-CM | POA: Insufficient documentation

## 2021-07-29 DIAGNOSIS — R29703 NIHSS score 3: Secondary | ICD-10-CM | POA: Insufficient documentation

## 2021-07-29 LAB — COMPREHENSIVE METABOLIC PANEL
ALT: 26 U/L (ref 0–55)
AST (SGOT): 20 U/L (ref 5–41)
Albumin/Globulin Ratio: 0.9 (ref 0.9–2.2)
Albumin: 4 g/dL (ref 3.5–5.0)
Alkaline Phosphatase: 127 U/L — ABNORMAL HIGH (ref 37–117)
Anion Gap: 9 (ref 5.0–15.0)
BUN: 10 mg/dL (ref 7.0–21.0)
Bilirubin, Total: 1.3 mg/dL — ABNORMAL HIGH (ref 0.2–1.2)
CO2: 26 mEq/L (ref 17–29)
Calcium: 9.2 mg/dL (ref 8.5–10.5)
Chloride: 107 mEq/L (ref 99–111)
Creatinine: 0.9 mg/dL (ref 0.4–1.0)
Globulin: 4.4 g/dL — ABNORMAL HIGH (ref 2.0–3.6)
Glucose: 91 mg/dL (ref 70–100)
Potassium: 3.6 mEq/L (ref 3.5–5.3)
Protein, Total: 8.4 g/dL — ABNORMAL HIGH (ref 6.0–8.3)
Sodium: 142 mEq/L (ref 135–145)
eGFR: >60 mL/min/{1.73_m2} (ref >=60)

## 2021-07-29 LAB — HIGH SENSITIVITY TROPONIN-I: hs Troponin-I: 6.3 ng/L

## 2021-07-29 LAB — COVID-19 (SARS-COV-2) & INFLUENZA  A/B, NAA (ROCHE LIAT)
Influenza A: NOT DETECTED
Influenza B: NOT DETECTED
SARS CoV 2 Overall Result: NOT DETECTED

## 2021-07-29 MED ORDER — HYDROMORPHONE HCL 1 MG/ML IJ SOLN
1.0000 mg | Freq: Once | INTRAMUSCULAR | Status: AC
Start: 2021-07-29 — End: 2021-07-29
  Administered 2021-07-29: 1 mg via INTRAVENOUS
  Filled 2021-07-29: qty 1

## 2021-07-29 MED ORDER — HYDRALAZINE HCL 20 MG/ML IJ SOLN
10.0000 mg | Freq: Once | INTRAMUSCULAR | Status: AC
Start: 2021-07-29 — End: 2021-07-29
  Administered 2021-07-29: 10 mg via INTRAVENOUS
  Filled 2021-07-29: qty 1

## 2021-07-29 MED ORDER — ONDANSETRON HCL 4 MG/2ML IJ SOLN
4.0000 mg | Freq: Four times a day (QID) | INTRAMUSCULAR | Status: DC | PRN
Start: 2021-07-29 — End: 2021-07-31
  Administered 2021-07-30 (×3): 4 mg via INTRAVENOUS
  Filled 2021-07-29 (×3): qty 2

## 2021-07-29 MED ORDER — DEXTROSE 10 % IV BOLUS
12.5000 g | INTRAVENOUS | Status: DC | PRN
Start: 2021-07-29 — End: 2021-07-31

## 2021-07-29 MED ORDER — NALOXONE HCL 0.4 MG/ML IJ SOLN (WRAP)
0.2000 mg | INTRAMUSCULAR | Status: DC | PRN
Start: 2021-07-29 — End: 2021-07-31

## 2021-07-29 MED ORDER — MAGNESIUM SULFATE IN D5W 1-5 GM/100ML-% IV SOLN
1.0000 g | Freq: Once | INTRAVENOUS | Status: AC
Start: 2021-07-29 — End: 2021-07-29
  Administered 2021-07-29: 1 g via INTRAVENOUS
  Filled 2021-07-29: qty 100

## 2021-07-29 MED ORDER — ONDANSETRON 4 MG PO TBDP
4.0000 mg | ORAL_TABLET | Freq: Four times a day (QID) | ORAL | Status: DC | PRN
Start: 2021-07-29 — End: 2021-07-31

## 2021-07-29 MED ORDER — ATORVASTATIN CALCIUM 40 MG PO TABS
40.0000 mg | ORAL_TABLET | Freq: Every day | ORAL | Status: DC
Start: 2021-07-30 — End: 2021-07-31
  Administered 2021-07-30 – 2021-07-31 (×2): 40 mg via ORAL
  Filled 2021-07-29 (×2): qty 1

## 2021-07-29 MED ORDER — ONDANSETRON HCL 4 MG/2ML IJ SOLN
4.0000 mg | Freq: Three times a day (TID) | INTRAMUSCULAR | Status: DC | PRN
Start: 2021-07-29 — End: 2021-07-29

## 2021-07-29 MED ORDER — GLUCOSE 40 % PO GEL (WRAP)
15.0000 g | ORAL | Status: DC | PRN
Start: 2021-07-29 — End: 2021-07-31

## 2021-07-29 MED ORDER — PANTOPRAZOLE SODIUM 40 MG PO TBEC
40.0000 mg | DELAYED_RELEASE_TABLET | Freq: Every morning | ORAL | Status: DC
Start: 2021-07-30 — End: 2021-07-31
  Administered 2021-07-30 – 2021-07-31 (×2): 40 mg via ORAL
  Filled 2021-07-29 (×2): qty 1

## 2021-07-29 MED ORDER — ENOXAPARIN SODIUM 40 MG/0.4ML IJ SOSY
40.0000 mg | PREFILLED_SYRINGE | Freq: Every day | INTRAMUSCULAR | Status: DC
Start: 2021-07-29 — End: 2021-07-31
  Administered 2021-07-29 – 2021-07-31 (×3): 40 mg via SUBCUTANEOUS
  Filled 2021-07-29 (×3): qty 0.4

## 2021-07-29 MED ORDER — HYDROMORPHONE HCL 1 MG/ML IJ SOLN
0.4000 mg | INTRAMUSCULAR | Status: DC | PRN
Start: 2021-07-29 — End: 2021-07-31
  Administered 2021-07-29 – 2021-07-31 (×10): 0.4 mg via INTRAVENOUS
  Filled 2021-07-29 (×10): qty 1

## 2021-07-29 MED ORDER — LORAZEPAM 2 MG/ML IJ SOLN
1.0000 mg | Freq: Once | INTRAMUSCULAR | Status: AC | PRN
Start: 2021-07-29 — End: 2021-07-29
  Administered 2021-07-29: 1 mg via INTRAVENOUS
  Filled 2021-07-29: qty 1

## 2021-07-29 MED ORDER — GLUCAGON 1 MG IJ SOLR (WRAP)
1.0000 mg | INTRAMUSCULAR | Status: DC | PRN
Start: 2021-07-29 — End: 2021-07-31

## 2021-07-29 MED ORDER — LEVETIRACETAM 500 MG PO TABS
500.0000 mg | ORAL_TABLET | Freq: Two times a day (BID) | ORAL | Status: DC
Start: 2021-07-29 — End: 2021-07-31
  Administered 2021-07-29 – 2021-07-31 (×4): 500 mg via ORAL
  Filled 2021-07-29 (×4): qty 1

## 2021-07-29 MED ORDER — LABETALOL HCL 5 MG/ML IV SOLN (WRAP)
10.0000 mg | INTRAVENOUS | Status: DC | PRN
Start: 2021-07-29 — End: 2021-07-31

## 2021-07-29 MED ORDER — ASPIRIN 81 MG PO CHEW
81.0000 mg | CHEWABLE_TABLET | Freq: Every day | ORAL | Status: DC
Start: 2021-07-29 — End: 2021-07-31
  Administered 2021-07-29 – 2021-07-31 (×3): 81 mg via ORAL
  Filled 2021-07-29 (×3): qty 1

## 2021-07-29 MED ORDER — DEXTROSE 50 % IV SOLN
12.5000 g | INTRAVENOUS | Status: DC | PRN
Start: 2021-07-29 — End: 2021-07-31

## 2021-07-29 MED ORDER — HYDRALAZINE HCL 20 MG/ML IJ SOLN
10.0000 mg | INTRAMUSCULAR | Status: DC | PRN
Start: 2021-07-29 — End: 2021-07-30
  Administered 2021-07-30 (×2): 10 mg via INTRAVENOUS
  Filled 2021-07-29 (×2): qty 1

## 2021-07-29 MED ORDER — MELATONIN 3 MG PO TABS
3.0000 mg | ORAL_TABLET | Freq: Every evening | ORAL | Status: DC | PRN
Start: 2021-07-29 — End: 2021-07-31

## 2021-07-29 MED ORDER — TECHNETIUM TC 99M ALBUMIN AGGREGATED
6.6000 | Freq: Once | Status: AC | PRN
Start: 2021-07-29 — End: 2021-07-29
  Administered 2021-07-29: 7 via INTRAVENOUS

## 2021-07-29 NOTE — PT Eval Note (Signed)
[facility]  Physical Therapy Attempt Note    Patient:  [name] Cook MRN#:  [id number]  Unit:  [facility] EMERGENCY DEPARTMENT Room/Bed:  WG66/ZL93      Physical Therapy evaluation attempted on 07/29/2021 at 5:51 PM unable to complete secondary to  patient currently in ED waiting for admission. PT/OT will follow up once pt is admitted.       RN aware.    Nicoletta Dress, PT, DPT  07/29/2021  5:52 PM    Physical Therapist  Physical Medicine and Rehabilitation  [phone]  Mon, Wed, Thurs 8-4:30pm  Tues, Fri 12:30-9:00pm

## 2021-07-29 NOTE — H&P (Signed)
[facility]      Patient: [name] Cook  Date: 07/29/2021   DOB: 1975-07-04  Admission Date: 07/29/2021   MRN: [id number]  Attending: Dallie Piles, MD  Please contact me on Epic secure chat        Chief Complaint   Patient presents with    Facial Droop    Chest Pain       Source of History: History is provided by the patient and chart review.   Discussed with ED physician    HISTORY AND PHYSICAL     [name] Cook is a 46 y.o. female with a PMH of CVA, seizures, hypertension, hyperlipidemia presenting to the ED for facial droop.  Reports right-sided facial droop noted by her husband yesterday at about 5 PM.  Also reports right arm and right leg weakness, numbness and tingling.  At that time she also developed chest pain and shortness of breath which has been intermittent since yesterday.  She presented to [facility] yesterday, CT head showed no acute findings.  Due to lack of beds, she was transferred to [facility] for further evaluation.  States she took her medications this morning.  She has a history of severe iodine contrast allergy.  Reports recent stress due to her niece passing away on Sunday.   Denies syncope, fever, chills, leg swelling, abdominal pain, nausea, vomiting, diarrhea, dysuria.    Past Medical History:  Past Medical History:   Diagnosis Date    Hypercholesteremia     Hypertension     Seizures     Stroke     20 15 and 2017, right side    TIA (transient ischemic attack) 2017       Past Surgical History:  Past Surgical History:   Procedure Laterality Date    APPENDECTOMY (OPEN)      CHOLECYSTECTOMY      EGD, BIOPSY N/A 04/16/2017    Procedure: EGD, BIOPSY;  Surgeon: Pershing Proud, MD;  Location: [facility];  Service: Gastroenterology;  Laterality: N/A;    HYSTERECTOMY         Medications:   Prior to Admission medications    Medication Sig Start Date End Date Taking? Authorizing Provider   albuterol sulfate HFA (PROVENTIL) 108 (90 Base) MCG/ACT inhaler Inhale 2 puffs into the lungs  every 4 (four) hours as needed for Wheezing or Shortness of Breath 03/13/20   Lorenda Cahill, PA   amLODIPine (NORVASC) 5 MG tablet Take 1 tablet (5 mg total) by mouth daily 09/15/20   Scarlette Ar, MD   aspirin 81 MG chewable tablet Chew 1 tablet (81 mg total) by mouth daily 09/19/17   Derek Mound, MD   atorvastatin (LIPITOR) 40 MG tablet Take 1 tablet (40 mg total) by mouth daily 09/15/20   Scarlette Ar, MD   LevETIRAcetam (KEPPRA PO) Take 500 mg by mouth 2 (two) times daily.       [provider]   lidocaine (LIDODERM) 5 % Place 1 patch onto the skin every 24 hours Remove & Discard patch within 12 hours or as directed by MD 09/18/20   Scarlette Ar, MD   lisinopril (ZESTRIL) 40 MG tablet Take 1 tablet (40 mg total) by mouth daily 09/15/20   Scarlette Ar, MD   meclizine (ANTIVERT) 25 MG tablet Take 1 tablet (25 mg total) by mouth 3 (three) times daily as needed for Nausea or Dizziness 09/18/20   Scarlette Ar, MD   multivitamin (MULTIVITAMIN) Tab Take 1 tablet by mouth daily  10/17/19   Scarlette Ar, MD   pantoprazole (PROTONIX) 40 MG tablet Take 1 tablet (40 mg total) by mouth daily 10/30/18   Karlton Lemon, MD       Allergies:   Allergies   Allergen Reactions    Contrast [Iodinated Contrast Media] Anaphylaxis     Patient woke up in ICU after having CT with contrast, last remembers being in CT    Fioricet [Butalbital-Apap-Caffeine] Hives    Flexeril [Cyclobenzaprine] Edema     Causes throat swelling per Pt report    Bentyl [Dicyclomine] Edema    Fentanyl     Iodine     Motrin [Ibuprofen] Swelling    Percocet [Oxycodone-Acetaminophen]     Shellfish-Derived Products     Toradol [Ketorolac Tromethamine]     Tramadol     Tylenol [Acetaminophen] Hives       Family History:   Family History   Problem Relation Age of Onset    Myocardial Infarction Father 45    Deep vein thrombosis Father     No known problems Mother        Social History:   Social History     Socioeconomic History    Marital status: Single    Tobacco Use    Smoking status: Never    Smokeless tobacco: Never   Vaping Use    Vaping status: Never Used   Substance and Sexual Activity    Alcohol use: No    Drug use: No             REVIEW OF SYSTEMS   ROS: Pertinent positives and negatives as noted in the HPI. All other systems were reviewed and are negative.     PHYSICAL EXAM      Vital Signs (most recent): BP (!) 199/119   Pulse 66   Temp 98.1 F (36.7 C) (Oral)   Resp 20   Ht 1.778 m (5\' 10" )   Wt 147.4 kg (325 lb)   LMP 10/15/2018 (Exact Date)   SpO2 95%   BMI 46.63 kg/m   Constiutional: NAD. Pleasant, alert and cooperative. Nontoxic appearing.  Appears comfortable.  HEENT: NCAT, PERRL, conjunctivae/corneas clear. No scleral icterus.   No nasal discharge. Oral mucosa moist.    Neck: Supple, no meningismus   Cardiovascular: RRR, normal S1 S2, no murmurs   Respiratory: Normal rate. CTAB, unlabored respiratory effort, no wheezes or crackles.   Gastrointestinal: +BS, soft, non-tender, non-distended. No rebound, rigidity or guarding.   Genitourinary: no suprapubic or costovertebral angle tenderness  Musculoskeletal: ROM and motor strength normal. No joint tenderness, deformity or swelling  Skin: no rashes, jaundice or other lesions. normal coloration and turgor  Extremities: warm. no edema. Pulses 2+. Capillary refill < 3s  Neurologic: AAOx3.  Right facial droop. Mild RUE/RLE weakness, ~4/5.   LUE/LLE 5/5 strength.   Sensation equal and intact to light touch.   Appropriately answering questions and following commands.   Patient speaks freely in full sentences. Normal speech.   Psychiatric: affect and mood appropriate. The patient is alert, interactive, appropriate.        LABS & IMAGING     Recent Results (from the past 24 hour(s))   Comprehensive metabolic panel    Collection Time: 07/29/21  4:27 PM   Result Value Ref Range    Glucose 91 70 - 100 mg/dL    BUN 66.0 7.0 - 63.0 mg/dL    Creatinine 0.9 0.4 - 1.0 mg/dL    Sodium 160 109 -  145 mEq/L     Potassium 3.6 3.5 - 5.3 mEq/L    Chloride 107 99 - 111 mEq/L    CO2 26 17 - 29 mEq/L    Calcium 9.2 8.5 - 10.5 mg/dL    Protein, Total 8.4 (H) 6.0 - 8.3 g/dL    Albumin 4.0 3.5 - 5.0 g/dL    AST (SGOT) 20 5 - 41 U/L    ALT 26 0 - 55 U/L    Alkaline Phosphatase 127 (H) 37 - 117 U/L    Bilirubin, Total 1.3 (H) 0.2 - 1.2 mg/dL    Globulin 4.4 (H) 2.0 - 3.6 g/dL    Albumin/Globulin Ratio 0.9 0.9 - 2.2    Anion Gap 9.0 5.0 - 15.0    eGFR >60.0 >=60 mL/min/1.73 m2   High Sensitivity Troponin-I at 0 hrs    Collection Time: 07/29/21  4:27 PM   Result Value Ref Range    hs Troponin-I 6.3 SEE BELOW ng/L   COVID-19 (SARS-CoV-2) and Influenza A/B, NAA ([facility])- Admission    Collection Time: 07/29/21  4:27 PM    Specimen: Nasopharyngeal; Culturette   Result Value Ref Range    Purpose of COVID testing Diagnostic -PUI     SARS-CoV-2 Specimen Source Nasal Swab     SARS CoV 2 Overall Result Not Detected     Influenza A Not Detected     Influenza B Not Detected             IMAGING:   Chest AP Portable    Result Date: 07/29/2021  Linear opacities at the right lung base may represent subsegmental atelectasis, however, developing pneumonia cannot be excluded. [name], MD 07/29/2021 3:59 PM     CT head at OSH: 07/29/21  No evidence of acute intracranial abnormality.         EKG Interpretation (upon my review):  NSR HR 65 no acute ST changes       Markers:  Recent Labs   Lab 07/29/21  1627   hs Troponin-I 6.3         Orders Placed This Encounter   Procedures    COVID-19 (SARS-CoV-2) and Influenza A/B, NAA ([facility])- Admission    Chest AP Portable    NM Pulmonary Vent/Perf (VQ Scan)    US Venous Low Extrem Duplx Dopp Comp Bilat    MRI brain without contrast    MR Angiogram Head WO Contrast    MR Angiogram Neck WO Contrast    CBC and differential    Comprehensive metabolic panel    High Sensitivity Troponin-I at 0 hrs    High Sensitivity Troponin-I at 2 hrs with calculated Delta    CBC without differential    Basic Metabolic Panel     Lipid panel    Hemoglobin A1C    Complete MRI Screening Questionnaire    Vital signs    Pulse Oximetry    Progressive Mobility Protocol    Notify physician    NSG Communication: Glucose POCT order (PRN hypoglycemia)    I/O    Height    Weight    Skin assessment    Notify physician (Critical Blood Glucose Value)    Notify physician (Communication: Document Abnormal Blood Glucose)    NSG Communication: Glucose POCT order (PRN hypoglycemia)    NSG Communication: Adult Hypoglycemia Treatment Algorithm    Place sequential compression device    Maintain sequential compression device    Education: Activity    Education: Disease Process &  Condition    Education: Pain Management    Education: Falls Risk    Education: Smoking Cessation    Education: Low Molecular Weight Heparin    Vital signs with pulse ox (Q4H)    Up as tolerated    Enter diet order if patient passes nursing swallow screen or per SLP recommendation    Notify physician (specify)    Document NIH Stroke Score    Nursing swallow assessment    Intake and Output    Neuro checks    Weigh patient upon Admission    Stroke Precautions    Education: Stroke    Initiate Stroke Care Plan    Telemetry Monitoring - Long Term    Reason for no statin therapy    Blood Pressure (Permissive Hypertension)    Full Code    Inpatient consult to Neurology    OT eval and treat    PT evaluate and treat    SLP eval and treat    ECG 12 Lead    Saline lock IV    Adult Admit to Inpatient    Fall precautions    Aspiration precautions    Seizure precautions    Skin and pressure ulcer precautions       ASSESSMENT & PLAN     Patient Active Hospital Problem List:       Facial droop (05/11/2016)                [name] Cook is a 46 y.o. female with a PMH of CVA, seizures, hypertension, hyperlipidemia, severe contrast allergy   presenting to the ED for right facial droop, right-sided weakness, numbness and tingling, chest pain and shortness of breath.   She presented to [facility]  yesterday, CT head showed no acute findings.  Due to lack of beds, she was transferred to [facility] for further evaluation.      #Facial droop, Right-sided weakness  -r/o CVA. CT head at OSH shows no acute intracranial abnormality.  -IMG Neurology consulted   -MRI brain and MRA head/neck ordered  -aspirin 81mg  daily and lipitor 40mg  daily  -check lipid profile, hba1c   -permissive hypertension   -neuro checks q4h   -PT/OT/SLP  -monitor on telemetry     #Chest pain, shortness of breath  -EKG reviewed, troponin negative. Hypertensive urgency may be contributing.  -Severe contrast allergy. VQ scan and BLE US venous doppler ordered to r/o PE and DVT  -Trend troponins   -Monitor on telemetry    #Hypertensive urgency  -IV hydralazine in the ED.  Permissive hypertension.  As needed IV hydralazine or IV labetalol.    #Seizure disorder  -Continue home Keppra.  Seizure precautions.       OTHER  Code Status:  FULL code   DVT prophylaxis:  lovenox   Disposition: Admit to the floor as observation status    Patient updated on plan of care.  All questions answered.            Signed,  Dallie Piles, MD             This chart was generated using hospital voice-recognition software which does not employ spell-checking or grammar-checking features. It was dictated, all or in part, in a busy and often noisy patient care environment. I have taken all usual measures to dictate carefully and to review all aspects this chart. Nonetheless, given the known and well-documented performance characteristics of VR software in such patient care environments, this dictation still may contain  unrecognized and wholly unintended errors or omissions

## 2021-07-29 NOTE — ED Provider Notes (Signed)
EMERGENCY DEPARTMENT HISTORY AND PHYSICAL EXAM     None        Date: 07/29/2021  Patient Name: [name] Cook    History of Presenting Illness     Chief Complaint   Patient presents with    Facial Droop    Chest Pain       History Provided By: patient    History: [name] Cook is a 46 y.o. female presenting to the ED with moderate severity, constant, acute left-sided facial droop with associated numbness and weakness in the right arm that she noticed yesterday afternoon.  She was last normal the night of May 17.  Patient does not have any neurological deficits at baseline.  Patient also started having some left-sided pressure nonradiating chest pain around that time.  She states she has been under severe stress due to murder of her niece on Mother's Day.  No smoking or illicit drugs.  Patient initially went to [facility].  Reviewed records from [facility]: She had serial negative troponin, negative CT scan of head.  She was transferred here because no beds available at [facility].      PCP: Sherlyn Hay Gilford Rile, MD  SPECIALISTS:    Current Facility-Administered Medications   Medication Dose Route Frequency Provider Last Rate Last Admin    aspirin chewable tablet 81 mg  81 mg Oral Daily Dallie Piles, MD   81 mg at 07/29/21 1756    [START ON 07/30/2021] atorvastatin (LIPITOR) tablet 40 mg  40 mg Oral Daily Dallie Piles, MD        dextrose (GLUCOSE) 40 % oral gel 15 g of glucose  15 g of glucose Oral PRN Dallie Piles, MD        Or    dextrose (D10W) 10% bolus 125 mL  12.5 g Intravenous PRN Dallie Piles, MD        Or    dextrose 50 % bolus 12.5 g  12.5 g Intravenous PRN Dallie Piles, MD        Or    glucagon (rDNA) (GLUCAGEN) injection 1 mg  1 mg Intramuscular PRN Dallie Piles, MD        enoxaparin (LOVENOX) syringe 40 mg  40 mg Subcutaneous Daily Dallie Piles, MD   40 mg at 07/29/21 2059    hydrALAZINE (APRESOLINE) injection 10 mg  10 mg Intravenous Q3H PRN Dallie Piles, MD        HYDROmorphone (DILAUDID) injection 0.4 mg  0.4 mg Intravenous Q4H PRN Clyde Lundborg, PA   0.4 mg at 07/29/21 2059    labetalol (NORMODYNE,TRANDATE) injection 10 mg  10 mg Intravenous Q15 Min PRN Dallie Piles, MD        levETIRAcetam (KEPPRA) tablet 500 mg  500 mg Oral BID Dallie Piles, MD   500 mg at 07/29/21 2059    LORazepam (ATIVAN) injection 1 mg  1 mg Intravenous Once PRN Clyde Lundborg, PA        melatonin tablet 3 mg  3 mg Oral QHS PRN Dallie Piles, MD        naloxone [facility]) injection 0.2 mg  0.2 mg Intravenous PRN Dallie Piles, MD        ondansetron (ZOFRAN-ODT) disintegrating tablet 4 mg  4 mg Oral Q6H PRN Dallie Piles, MD        Or    ondansetron [facility]) injection 4 mg  4 mg Intravenous Q6H PRN Dallie Piles, MD        [  START ON 07/30/2021] pantoprazole (PROTONIX) EC tablet 40 mg  40 mg Oral QAM AC Dallie Piles, MD           Past History     Past Medical History:  Past Medical History:   Diagnosis Date    Hypercholesteremia     Hypertension     Seizures     Stroke     2015 and 2017, right side    TIA (transient ischemic attack) 2017       Past Surgical History:  Past Surgical History:   Procedure Laterality Date    APPENDECTOMY (OPEN)      CHOLECYSTECTOMY      EGD, BIOPSY N/A 04/16/2017    Procedure: EGD, BIOPSY;  Surgeon: Pershing Proud, MD;  Location: [facility];  Service: Gastroenterology;  Laterality: N/A;    HYSTERECTOMY         Family History:  Family History   Problem Relation Age of Onset    Myocardial Infarction Father 106    Deep vein thrombosis Father     No known problems Mother        Social History:  Social History     Tobacco Use    Smoking status: Never    Smokeless tobacco: Never   Vaping Use    Vaping status: Never Used   Substance Use Topics    Alcohol use: No    Drug use: No       Allergies:  Allergies   Allergen Reactions    Contrast [Iodinated Contrast Media] Anaphylaxis     Patient woke up in ICU after having CT with contrast, last remembers  being in CT    Fioricet [Butalbital-Apap-Caffeine] Hives    Flexeril [Cyclobenzaprine] Edema     Causes throat swelling per Pt report    Bentyl [Dicyclomine] Edema    Fentanyl     Iodine     Motrin [Ibuprofen] Swelling    Percocet [Oxycodone-Acetaminophen]     Shellfish-Derived Products     Toradol [Ketorolac Tromethamine]     Tramadol     Tylenol [Acetaminophen] Hives       Review of Systems     Review of Systems: All pertinent systems reviewed and negative.         Physical Exam   BP 166/88   Pulse (!) 58   Temp 97.3 F (36.3 C) (Oral)   Resp 17   Ht 5\' 10"  (1.778 m)   Wt 147.4 kg   LMP 10/15/2018 (Exact Date)   SpO2 97%   BMI 46.63 kg/m     Constitutional: Vital signs reviewed. Well appearing. NAD, texting on phone. No distress.  Head: Normocephalic, atraumatic  Eyes: Conjunctiva and sclera are normal.  No injection or discharge.  Ears, Nose, Throat:  Normal external examination of the nose and ears. No throat or oropharyngeal swelling or erythema. Midline uvula. Mucous membranes moist.  Neck: Normal range of motion. Supple, no meningeal signs. Trachea midline. No stridor. No JVD  Respiratory/Chest: Clear to auscultation. No respiratory distress.   Cardiovascular: Regular rate and rhythm. No murmurs.  Abdomen:  Bowel sounds intact. No rebound or guarding. Soft.  Non-tender.  Back: No CVA tenderness to percussion. No focal tenderness.  Upper Extremity:  No edema. No cyanosis. Bilateral radial pulses intact and equal.   Lower Extremity:  No edema. No cyanosis. Bilateral calves symmetrical and non-tender. Bilateral femoral, DP, PT pulses intact and equal.  Skin: Warm and dry. No rash.  Neuro: CNII -XII  intact to testing for left-sided facial droop.  Strength in the right arm and right leg mildly decreased compared to left arm and left leg.  Sensation to sharp touch creased in the right arm and right leg compared to left arm and left leg.  Coordination intact to finger to nose testing and heel to shin  rubbing. NIHSS 3.  Psychiatric: Normal affect.  Normal insight.      Diagnostic Study Results     Labs -     Results       Procedure Component Value Units Date/Time    High Sensitivity Troponin-I at 0 hrs [[id number]] Collected: 07/29/21 1627    Specimen: Blood Updated: 07/29/21 1703     hs Troponin-I 6.3 ng/L     COVID-19 (SARS-CoV-2) and Influenza A/B, NAA ([facility] Rapid)- Admission [[id number]] Collected: 07/29/21 1627    Specimen: Culturette from Nasopharyngeal Updated: 07/29/21 1658     Purpose of COVID testing Diagnostic -[facility]     SARS-CoV-2 Specimen Source Nasal Swab     SARS CoV 2 Overall Result Not Detected     Influenza A Not Detected     Influenza B Not Detected    Narrative:      o Collect and clearly label specimen type:  o PREFERRED-Upper respiratory specimen: One Nasal Swab in  Transport Media.  o Hand deliver to laboratory ASAP  Diagnostic -[facility]    Comprehensive metabolic panel [[id number]]  (Abnormal) Collected: 07/29/21 1627    Specimen: Blood Updated: 07/29/21 1657     Glucose 91 mg/dL      BUN 41.3 mg/dL      Creatinine 0.9 mg/dL      Sodium 244 mEq/L      Potassium 3.6 mEq/L      Chloride 107 mEq/L      CO2 26 mEq/L      Calcium 9.2 mg/dL      Protein, Total 8.4 g/dL      Albumin 4.0 g/dL      AST (SGOT) 20 U/L      ALT 26 U/L      Alkaline Phosphatase 127 U/L      Bilirubin, Total 1.3 mg/dL      Globulin 4.4 g/dL      Albumin/Globulin Ratio 0.9     Anion Gap 9.0     eGFR >60.0 mL/min/1.73 m2     CBC and differential [[id number]] Collected: 07/29/21 1627    Specimen: Blood Updated: 07/29/21 1633            Radiologic Studies -   Radiology Results (24 Hour)       Procedure Component Value Units Date/Time    NM Spect CT Fusion Sgl Area Sgl Day [[id number]] Collected: 07/29/21 1940    Order Status: Completed Updated: 07/29/21 1948    Narrative:      Study: NM SPECT CT FUSION SGL AREA SGL DAY, NM PULMONARY SCAN PERFUSION PARTICULATE    Date completed: 07/29/2021 6:23 PM    Clinical information: r/o  pe    Comparison: None available.    Technique: SPECT-CT fusion and pulmonary scan perfusion was performed. Tc21m MAA 6.6 mCi IV    Findings:  Perfusion images are homogeneous with no wedge-shaped perfusion defects. SPECT-CT fusion shows uniform hypermetabolic activity. No areas of mismatch. There is bibasilar atelectasis.      Impression:        Low probability of pulmonary embolism.    Nelya Ebadirad  07/29/2021 7:46 PM    NM Lung  Scan Perfusion Particulate [[id number]] Collected: 07/29/21 1940    Order Status: Completed Updated: 07/29/21 1948    Narrative:      Study: NM SPECT CT FUSION SGL AREA SGL DAY, NM PULMONARY SCAN PERFUSION PARTICULATE    Date completed: 07/29/2021 6:23 PM    Clinical information: r/o pe    Comparison: None available.    Technique: SPECT-CT fusion and pulmonary scan perfusion was performed. Tc57m MAA 6.6 mCi IV    Findings:  Perfusion images are homogeneous with no wedge-shaped perfusion defects. SPECT-CT fusion shows uniform hypermetabolic activity. No areas of mismatch. There is bibasilar atelectasis.      Impression:        Low probability of pulmonary embolism.    Nelya Ebadirad  07/29/2021 7:46 PM    US Venous Low Extrem Duplx Dopp Comp Bilat [[id number]] Resulted: 07/29/21 1930    Order Status: Sent Updated: 07/29/21 1932    Chest AP Portable [[id number]] Collected: 07/29/21 1557    Order Status: Completed Updated: 07/29/21 1601    Narrative:      XR CHEST AP PORTABLE: 07/29/2021 3:46 PM    CLINICAL INFORMATION:  cp.    COMPARISON:  03/11/2021.    FINDINGS:  Ill-defined linear opacities at the right lung base. No evidence of pleural effusion or pneumothorax. Cardiomediastinal silhouette is within normal limits.  No acute osseous abnormality.      Impression:        Linear opacities at the right lung base may represent subsegmental atelectasis, however, developing pneumonia cannot be excluded.    [name], MD  07/29/2021 3:59 PM        .    Medical Decision Making   I am the first provider  for this patient.    I reviewed the vital signs, available nursing notes, medical records, past medical history, past surgical history, family history and social history.    Vital Signs-Reviewed the patient's vital signs.   Patient Vitals for the past 12 hrs:   BP Temp Pulse Resp   07/29/21 1956 166/88 97.3 F (36.3 C) (!) 58 17   07/29/21 1805 -- 97.7 F (36.5 C) -- --   07/29/21 1800 175/83 -- (!) 55 14   07/29/21 1701 (!) 199/119 -- 66 20   07/29/21 1418 172/90 98.1 F (36.7 C) (!) 56 18         EKG:  Interpreted by the EP.   Time Interpreted: 1700   Rate: 65   Rhythm: NSR    Interpretation: no STEMI   Comparison: 03/11/21-- no sig change      ED Course:     1700 - d/aw Dr. Ellamae Sia, neurology on call for Dr. Francesco Sor, will consult. MRI/MRA    1733 - d/aw Dr. Richardson Chiquito, Sound on call, accepts patient for obs to [facility]    Provider Notes: Patient presenting to the ED with moderate severity, acute left-sided facial droop and right-sided numbness and weakness.  Initially seen at [facility] and then transferred to [facility] ED for further evaluation due to lack of beds at [facility].  Discussed with neurology and medicine on-call as above.  NIH stroke scale 3 in the ED.  She was admitted for further management.    HEART score 3. Low suspicion for aortic dissection based on equal bilateral radial pulses, no mediastinal widening on CXR interpreted by me, and patient's description of symptoms. No pulsatile abdominal mass or abdominal tenderness to suggest AAA. Wells score zero, PERC zero, low  suspicion for PE. No infectious complaints or risk factors to suggest pericarditis or endocarditis. No JVD, SOB, or cardiac enlargement on CXR to suggest tamponade. No PNA or PTX on CXR.         Core measures: Patient not tPA candidate due to last normal more than 4.5 hours ago      Diagnosis     Clinical Impression:   1. Facial droop        Treatment Plan:   ED Disposition       ED Disposition   Observation     Condition   --    Date/Time   Fri Jul 29, 2021 1733    Comment   Admitting Physician: Dallie Piles [[id number]]   Service:: Medicine [[id number]]   Estimated Length of Stay: < 2 midnights   Tentative Discharge Plan?: Home or Self Care [1]   Does patient need telemetry?: Yes   Is patient 18 yrs or greater?: Yes   Telemetry type (separate Telemetry order is also required):: Adult telemetry                   _______________________________    This note was generated by the Epic EMR system/ Dragon speech recognition and may contain inherent errors or omissions not intended by the user. Grammatical errors, random word insertions, deletions and pronoun errors  are occasional consequences of this technology due to software limitations. Not all errors are caught or corrected. If there are questions or concerns about the content of this note or information contained within the body of this dictation they should be addressed directly with the author for clarification.      Attestations: This note is prepared by Lynnea Ferrier, MD    _______________________________       Maryella Shivers, MD  07/29/21 2212

## 2021-07-29 NOTE — ED Triage Notes (Signed)
46 y.o female biba from [facility] to seek further evaluation.  Per medics, pt c/o right side facial droop and CP 7/10 that began last night at 2130.  Hx of HTN, 2 strokes.

## 2021-07-29 NOTE — ED Notes (Signed)
Bed: PU38  Expected date:   Expected time:   Means of arrival:   Comments:

## 2021-07-29 NOTE — Consults (Signed)
[facility] Consultation Note                                       Date Time: 07/29/21 5:58 PM  Patient Name: [name] Cook  Requesting Physician: Dallie Piles, MD  Date of Admission: 07/29/2021    CC / Reason for Consultation: facial droop, right sided weakness       Assessment:     45y/o woman hx of seizure, multiple admissions with stroke like symptoms and negative MRI, HTN, HLD transferred from [facility] for workup of chest pain and right sided weakness/numbness.     Exam with effort dependent weakness on right side and subjective sensory loss of right face, arm and leg that splits the midline. CT head from OSH reported to be negative for acute process.     Cannot r/o acute stroke but overall clinical picture more consistent with a functional process.       Plan:     --MRI brain ordered in ED. Would cancel MRA head/neck as they were done with prior presentation for similar symptoms  --can continue daily aspirin and statin  --would benefit from psychiatry follow up      HPI   [name] Cook is a 46 y.o. female with hx of reported prior CVA, seizure, htn, hld presenting to the ED with facial droop. LKW 1700 on 05/18.     Initially presented to [facility] on 05/18 after noticing a right sided facial droop, RUE/LE weakness and numbness. Denies any change in vision or speech, no vertigo or gait instability. No recent head or neck trauma. No headache. Endorses some chest pain and shortness of breath. CT head at OSH reported to have no acute findings. Transferred to [facility] for further workup as no beds at OSH.     She has had multiple work ups in the past for similar concerns of right sided weakness and numbness. Most recent was 03/12/21 at that time MRI negative for acute process, MRA head/neck with no evidence of LVO, flow limiting stenosis or dissection.     Past Medical Hx     Past Medical History:   Diagnosis Date    Hypercholesteremia     Hypertension     Seizures      Stroke     2015 and 2017, right side    TIA (transient ischemic attack) 2017          Past Surgical Hx:     Past Surgical History:   Procedure Laterality Date    APPENDECTOMY (OPEN)      CHOLECYSTECTOMY      EGD, BIOPSY N/A 04/16/2017    Procedure: EGD, BIOPSY;  Surgeon: Pershing Proud, MD;  Location: [facility];  Service: Gastroenterology;  Laterality: N/A;    HYSTERECTOMY          Family Medical History:      Family History   Problem Relation Age of Onset    Myocardial Infarction Father 43    Deep vein thrombosis Father     No known problems Mother        Social Hx     Social History     Socioeconomic History    Marital status: Single   Tobacco Use    Smoking status: Never    Smokeless tobacco: Never   Vaping Use    Vaping status: Never Used   Substance and Sexual Activity  Alcohol use: No    Drug use: No       Meds     Home :   Prior to Admission medications    Medication Sig Start Date End Date Taking? Authorizing Provider   albuterol sulfate HFA (PROVENTIL) 108 (90 Base) MCG/ACT inhaler Inhale 2 puffs into the lungs every 4 (four) hours as needed for Wheezing or Shortness of Breath 03/13/20  Yes Lorenda Cahill, PA   amLODIPine (NORVASC) 5 MG tablet Take 1 tablet (5 mg total) by mouth daily 09/15/20  Yes Scarlette Ar, MD   aspirin 81 MG chewable tablet Chew 1 tablet (81 mg total) by mouth daily 09/19/17  Yes Matiana, Raiford Noble, MD   atorvastatin (LIPITOR) 40 MG tablet Take 1 tablet (40 mg total) by mouth daily 09/15/20  Yes Scarlette Ar, MD   LevETIRAcetam (KEPPRA PO) Take 500 mg by mouth 2 (two) times daily.      Yes [provider]   pantoprazole (PROTONIX) 40 MG tablet Take 1 tablet (40 mg total) by mouth daily 10/30/18  Yes Karlton Lemon, MD   lidocaine (LIDODERM) 5 % Place 1 patch onto the skin every 24 hours Remove & Discard patch within 12 hours or as directed by MD 09/18/20 07/29/21  Scarlette Ar, MD   lisinopril (ZESTRIL) 40 MG tablet Take 1 tablet (40 mg total) by mouth daily 09/15/20  07/29/21  Scarlette Ar, MD   meclizine (ANTIVERT) 25 MG tablet Take 1 tablet (25 mg total) by mouth 3 (three) times daily as needed for Nausea or Dizziness 09/18/20 07/29/21  Scarlette Ar, MD   multivitamin (MULTIVITAMIN) Tab Take 1 tablet by mouth daily 10/17/19 07/29/21  Scarlette Ar, MD      Inpatient :   Current Facility-Administered Medications   Medication Dose Route Frequency    aspirin  81 mg Oral Daily    [START ON 07/30/2021] atorvastatin  40 mg Oral Daily    enoxaparin  40 mg Subcutaneous Daily    levETIRAcetam  500 mg Oral BID    [START ON 07/30/2021] pantoprazole  40 mg Oral QAM AC         Allergies    Contrast [iodinated contrast media], Fioricet [butalbital-apap-caffeine], Flexeril [cyclobenzaprine], Bentyl [dicyclomine], Fentanyl, Iodine, Motrin [ibuprofen], Percocet [oxycodone-acetaminophen], Shellfish-derived products, Toradol [ketorolac tromethamine], Tramadol, and Tylenol [acetaminophen]      Review of Systems     All other systems were reviewed and are negative except for that mentioned in the HPI    Physical Exam:   Temp:  [98.1 F (36.7 C)] 98.1 F (36.7 C)  Heart Rate:  [56-66] 66  Resp Rate:  [18-20] 20  BP: (172-199)/(90-119) 199/119       General: in no acute distress. Cooperative with the exam  Neck: supple  CVS: warm and well perfused  Resp: no respiratory distress  Extremities: no pedal edema, no rashes noted    Neurological Examination:  MSE: A&Ox3, speech fluent without word-finding difficulty or paraphasic errors, follows simple commnads    CN: Pupils 4-->57mm b/l, EOMI, no nystagmus, VFFC, decreased LT R V1-V3 that splits the midline, face symmetric at rest, with activation appears to have pulling on the left side, no dysarthria.  Motor: No pronator drift.  5/5 LUE and LLE. Effort dependent weakness on left side but with distraction appears to have 5/5 strength RUE and RLE  Sensory: decreased LT on RUE and RLE  Coord: FNF, RAM without limb ataxia or dysmetria.  Gait:  deferred  Labs:     Results       Procedure Component Value Units Date/Time    High Sensitivity Troponin-I at 0 hrs [[id number]] Collected: 07/29/21 1627    Specimen: Blood Updated: 07/29/21 1703     hs Troponin-I 6.3 ng/L     COVID-19 (SARS-CoV-2) and Influenza A/B, NAA ([facility])- Admission [[id number]] Collected: 07/29/21 1627    Specimen: Culturette from Nasopharyngeal Updated: 07/29/21 1658     Purpose of COVID testing Diagnostic -[facility]I     SARS-CoV-2 Specimen Source Nasal Swab     SARS CoV 2 Overall Result Not Detected     Influenza A Not Detected     Influenza B Not Detected    Narrative:      o Collect and clearly label specimen type:  o PREFERRED-Upper respiratory specimen: One Nasal Swab in  Transport Media.  o Hand deliver to laboratory ASAP  Diagnostic -[facility]I    Comprehensive metabolic panel [[id number]]  (Abnormal) Collected: 07/29/21 1627    Specimen: Blood Updated: 07/29/21 1657     Glucose 91 mg/dL      BUN 29.5 mg/dL      Creatinine 0.9 mg/dL      Sodium 284 mEq/L      Potassium 3.6 mEq/L      Chloride 107 mEq/L      CO2 26 mEq/L      Calcium 9.2 mg/dL      Protein, Total 8.4 g/dL      Albumin 4.0 g/dL      AST (SGOT) 20 U/L      ALT 26 U/L      Alkaline Phosphatase 127 U/L      Bilirubin, Total 1.3 mg/dL      Globulin 4.4 g/dL      Albumin/Globulin Ratio 0.9     Anion Gap 9.0     eGFR >60.0 mL/min/1.73 m2     CBC and differential [[id number]] Collected: 07/29/21 1627    Specimen: Blood Updated: 07/29/21 1633            Rads:     Results for orders placed or performed during the hospital encounter of 03/11/21   MRI Brain WO Contrast    Narrative    Clinical History:    right sided weakness    Technique:    MRI BRAIN WO CONTRAST MRI of the brain was performed without contrast as  per departmental protocol. Multiplanar reformatted images were submitted  for review.    Comparison:    MRI of the brain dated 09/14/2020. CT scan of the brain dated  03/11/2021.    Findings:    The ventricles and sulci are  normal. There is no intra or extra-axial  fluid collection, midline shift or mass effect. Again noted are 2  punctate foci of T2/FLAIR hyperintensity within the left centrum  semiovale. There are no areas of restricted diffusion. The basal  cisterns are patent. The regions of the sella, pineal gland, and  craniocervical junction are unremarkable. Flow-voids are identified  within the vessels of the skull base. The globes and orbits are  unremarkable. There is trace ethmoid sinus mucosal thickening. The  mastoid air cells are clear. The scalp and calvarium are unremarkable.      Impression    No acute intracranial abnormality.    2 punctate foci of T2/FLAIR hyperintensity within the left centrum  semiovale are again identified, and may be secondary to chronic  microvascular ischemic change for migraine.    No significant  interval change.    Alric Seton MD, MD   03/12/2021 2:52 AM   MRA Head (intracranial) Without Contrast    Narrative    Clinical History:    Neuro deficit, acute, stroke suspected    Technique:    MR ANGIOGRAM HEAD WO CONTRAST 3-D time-of-flight MRA of the brain was  performed without contrast as per departmental protocol. 3-D MIP and  source images were submitted for review.    Comparison:    MRA of the brain dated 10/30/2018    Findings:    The internal carotid arteries, middle cerebral arteries, and anterior  cerebral arteries are unremarkable. The anterior communicating artery is  unremarkable. The intracranial segments of the vertebral arteries, right  posterior inferior cerebellar arteries, basilar artery, superior  cerebellar arteries and posterior cerebral arteries are unremarkable.  The posterior communicating arteries are unremarkable. There is no  evidence of an aneurysm, significant stenosis or major branch occlusion.      Impression    No evidence of an aneurysm, significant stenosis or major branch  occlusion.    Alric Seton MD, MD   03/12/2021 2:49 AM   CT Head without Contrast     Narrative    CLINICAL INDICATION: Neuro deficit, acute, stroke suspected    TECHNIQUE: Axial CT of the head was performed without contrast.     A combination of automatic exposure control, adjustment of the MA and/or  KV according to patient size and/or use of iterative reconstructive  technique was utilized.    COMPARISON: 09/14/2020    FINDINGS:  No extra-axial collection/hemorrhage, mass, mass effect, or midline  shift is noted. The ventricles and sulci are normal for age. No evidence  of acute transcortical infarct.     The orbits are normal. No focal soft tissue abnormalities identified.  The visualized paranasal sinuses and mastoid air cells are normal. No  acute osseous abnormality is identified.      Impression    1.  No acute findings.    Ewing Schlein MD, MD   03/11/2021 8:35 PM   Results for orders placed or performed during the hospital encounter of 08/18/15   MRI Brain With / Without Contrast    Narrative    MRI BRAIN W WO CONTRAST  CLINICAL HISTORY:Right facial droop    COMPARISON: None available.    TECHNIQUE: Multiplanar multisequence imaging performed. Following 10 cc  Gadavist axial and coronal T1-weighted images were obtained.     INTERPRETATION: Small hyperintense foci noted in the left frontal  subcortical white matter and the centrum semiovale. No other focal  abnormality.  No area of restricted diffusion identified. No acute  hemorrhage, mass lesion or mass effect. The brainstem, cerebellum and  the craniocervical junction are within normal limits. The contrast  enhancement pattern is normal.      Impression         [No acute process Specifically there is no acute  cortical infarct, mass or evidence of hemorrhage.       Heron Nay, MD   08/19/2015 8:15 AM             Elspeth Cho  [facility]  Available on Epic chat 7a to 7p

## 2021-07-29 NOTE — ED Notes (Signed)
[facility] EMERGENCY DEPARTMENT  ED NURSING NOTE FOR THE RECEIVING INPATIENT NURSE   ED NURSE Verita Lamb [phone]   ED CHARGE RN [phone]   ADMISSION INFORMATION   [name] Cook is a 46 y.o. female admitted with a diagnosis of:    1. Facial droop         Isolation: None   Allergies: Contrast [iodinated contrast media], Fioricet [butalbital-apap-caffeine], Flexeril [cyclobenzaprine], Bentyl [dicyclomine], Fentanyl, Iodine, Motrin [ibuprofen], Percocet [oxycodone-acetaminophen], Shellfish-derived products, Toradol [ketorolac tromethamine], Tramadol, and Tylenol [acetaminophen]   Holding Orders confirmed? Yes   Belongings Documented? Yes   Home medications sent to pharmacy confirmed? na   NURSING CARE   Patient Comes From:   Mental Status: Home/Family Care  alert and oriented   ADL: Independent with all ADLs   Ambulation: mild difficulty   Pertinent Information  and Safety Concerns: Right side facial droop, MRI screen complete.     COVID Test sent to lab? Yes   VITAL SIGNS   Time BP Temp Pulse Resp SpO2   1800 175/83 97.7 55 14 94%RA   CT / [facility]   CT Head ordered on this patient?  No, done PTA at [facility]   NIH/Dysphagia assessment done prior to admission? Yes   PERSONAL PROTECTIVE EQUIPMENT   Gloves   LAB RESULTS   Labs Reviewed   COMPREHENSIVE METABOLIC PANEL - Abnormal; Notable for the following components:       Result Value    Protein, Total 8.4 (*)     Alkaline Phosphatase 127 (*)     Bilirubin, Total 1.3 (*)     Globulin 4.4 (*)     All other components within normal limits   COVID-19 (SARS-COV-2) & INFLUENZA  A/B, NAA (ROCHE LIAT)    Narrative:     o Collect and clearly label specimen type:  o PREFERRED-Upper respiratory specimen: One Nasal Swab in  Transport Media.  o Hand deliver to laboratory ASAP  Diagnostic -PUI   HIGH SENSITIVITY TROPONIN-I   CBC AND DIFFERENTIAL   HIGH SENSITIVITY TROPONIN-I WITH DELTA          Ticket to Ride Printed: Yes

## 2021-07-29 NOTE — EDIE (Signed)
COLLECTIVE?NOTIFICATION?07/29/2021 13:43?[name]?MRN: [id number]    Criteria Met      5 ED Visits in 12 Months    Security and Safety  No Security Events were found.  ED Care Guidelines  There are currently no ED Care Guidelines for this patient. Please check your facility's medical records system.        Prescription Monitoring Program  000??- Narcotic Use Score  000??- Sedative Use Score  000??- Stimulant Use Score  000??- Overdose Risk Score  - All Scores range from 000-999 with 75% of the population scoring < 200 and on 1% scoring above 650  - The last digit of the narcotic, sedative, and stimulant score indicates the number of active prescriptions of that type  - Higher Use scores correlate with increased prescribers, pharmacies, mg equiv, and overlapping prescriptions  - Higher Overdose Risk Scores correlate with increased risk of unintentional overdose death   Concerning or unexpectedly high scores should prompt a review of the PMP record; this does not constitute checking PMP for prescribing purposes.    E.D. Visit Count (12 mo.)  Facility Visits   [place] [facility] 3   [name] 3   Total 6   Note: Visits indicate total known visits.     Recent Emergency Department Visit Summary  Date Facility [facility] Type Diagnoses or Chief Complaint    Jul 29, 2021  [place] [facility] [place] [facility]  [name]  [place]  Emergency      Chest Pain      Facial Droop      Jul 13, 2021  [facility].  [place]  [place]  Emergency      chest discomfort, back pain, nausea      Pain, Chest      Chest pain, unspecified      Palpitations      Other specified abnormal findings of blood chemistry      Mar 11, 2021  [place] [facility] [place] [facility]  [name]  [place]  Emergency      chest pain, rt face drop      Facial Droop      Chest Pain      Facial weakness      Feb 04, 2021  [facility].  [place].  [place]  Emergency      Acute cystitis without hematuria      Jan 03, 2021  [facility].  [place].  [place]  Emergency       Unspecified abdominal pain      Sep 14, 2020  [place] [facility] [place] [facility]  [name]  [place]  Emergency      chest pain, right side of face is dropping      Facial Droop      Chest Pain      Chest pain, unspecified        Recent Inpatient Visit Summary  Date Facility [facility] Type Diagnoses or Chief Complaint    Jun 17, 2021  [facility] [place].  [place]  [place]  Internal Medicine      Sciatica, right side      Chest pain, unspecified      Mar 11, 2021  [place] [facility] [place] [facility]  [name]    Medical Surgical      Weakness      Facial weakness      Anesthesia of skin      Chest pain, unspecified  Care Team  No Care Team was found.  Collective Portal  This patient has registered at the [facility] Emergency Department   For more information visit: [email or web address]     PLEASE NOTE:     1.   Any care recommendations and other clinical information are provided as guidelines or for historical purposes only, and providers should exercise their own clinical judgment when providing care.    2.   You may only use this information for purposes of treatment, payment or health care operations activities, and subject to the limitations of applicable Collective Policies.    3.   You should consult directly with the organization that provided a care guideline or other clinical history with any questions about additional information or accuracy or completeness of information provided.    ? 2023 [place] - [email or web address]

## 2021-07-30 DIAGNOSIS — R29810 Facial weakness: Secondary | ICD-10-CM

## 2021-07-30 LAB — ECG 12-LEAD
Atrial Rate: 65 {beats}/min
IHS MUSE NARRATIVE AND IMPRESSION: NORMAL
P Axis: 32 degrees
P-R Interval: 164 ms
Q-T Interval: 458 ms
QRS Duration: 88 ms
QTC Calculation (Bezet): 476 ms
R Axis: 1 degrees
T Axis: 11 degrees
Ventricular Rate: 65 {beats}/min

## 2021-07-30 MED ORDER — AMLODIPINE BESYLATE 5 MG PO TABS
5.0000 mg | ORAL_TABLET | Freq: Every day | ORAL | Status: DC
Start: 2021-07-30 — End: 2021-07-31
  Administered 2021-07-30 – 2021-07-31 (×2): 5 mg via ORAL
  Filled 2021-07-30 (×2): qty 1

## 2021-07-30 MED ORDER — PREDNISONE 20 MG PO TABS
60.0000 mg | ORAL_TABLET | Freq: Every morning | ORAL | Status: DC
Start: 2021-07-31 — End: 2021-07-31
  Administered 2021-07-31: 60 mg via ORAL
  Filled 2021-07-30: qty 3

## 2021-07-30 MED ORDER — HYDRALAZINE HCL 20 MG/ML IJ SOLN
10.0000 mg | INTRAMUSCULAR | Status: DC | PRN
Start: 2021-07-30 — End: 2021-07-31
  Administered 2021-07-30: 10 mg via INTRAVENOUS
  Filled 2021-07-30: qty 1

## 2021-07-30 MED ORDER — METOCLOPRAMIDE HCL 5 MG/ML IJ SOLN
5.0000 mg | Freq: Four times a day (QID) | INTRAMUSCULAR | Status: DC | PRN
Start: 2021-07-30 — End: 2021-07-31

## 2021-07-30 MED ORDER — LISINOPRIL 10 MG PO TABS
40.0000 mg | ORAL_TABLET | Freq: Every day | ORAL | Status: DC
Start: 2021-07-30 — End: 2021-07-31
  Administered 2021-07-30 – 2021-07-31 (×2): 40 mg via ORAL
  Filled 2021-07-30 (×2): qty 4

## 2021-07-30 MED ORDER — MAGNESIUM OXIDE 400 MG TABS (WRAP)
400.0000 mg | ORAL_TABLET | Freq: Two times a day (BID) | ORAL | Status: DC
Start: 2021-07-30 — End: 2021-07-31
  Administered 2021-07-30 – 2021-07-31 (×2): 400 mg via ORAL
  Filled 2021-07-30 (×2): qty 1

## 2021-07-30 MED ORDER — DEXAMETHASONE SODIUM PHOSPHATE 4 MG/ML IJ SOLN (WRAP)
8.0000 mg | Freq: Once | INTRAMUSCULAR | Status: AC
Start: 2021-07-30 — End: 2021-07-30
  Administered 2021-07-30: 8 mg via INTRAVENOUS
  Filled 2021-07-30: qty 2

## 2021-07-30 MED ORDER — DIPHENHYDRAMINE HCL 50 MG/ML IJ SOLN
25.0000 mg | Freq: Four times a day (QID) | INTRAMUSCULAR | Status: DC | PRN
Start: 2021-07-30 — End: 2021-07-31
  Administered 2021-07-30: 25 mg via INTRAVENOUS
  Filled 2021-07-30: qty 1

## 2021-07-30 NOTE — PT Eval Note (Signed)
Physical Therapy Eval and Treatment  [name] Cook      Post Acute Care Therapy Recommendations   Discharge Recommendations:  Home with supervision, possible OP PT pending OOB assessment    DME needs IF patient is discharging home: No additional equipment/DME recommended at this time    Therapy discharge recommendations may change with patient status.  Please refer to most recent note for up-to-date recommendations.    Unit: [facility]  Bed: A2505/A2505-B    ___________________________________________________    Time of Evaluation and Treatment:  Time Calculation  PT Received On: 07/30/21  Start Time: 1007  Stop Time: 1034  Time Calculation (min): 27 min    Evaluation Time: 15 minutes  Treatment Time: 12 minutes    Chart Review and Collaboration with Care Team: 6 minutes, not included in above time    PT Visit Number: 1    Consult received for [name] Cook for PT Evaluation and Treatment.  Patient's medical condition is appropriate for Physical therapy intervention at this time.    Activity Orders:  PT eval and treat, progressive mobility protocol, and activity as tolerated    Precautions and Contraindications:  Precautions  Other Precautions: Fall, HBP    Personal Protective Equipment (PPE)  gloves, procedure mask, surgical / bouffant cap, and pt wore procedure mask    Medical Diagnosis:  Facial droop [R29.810]    History of Present Illness:  [name] Cook is a 46 y.o. female admitted on 07/29/2021 drom [facility] with PMHx of seizures, CVA, and HTN presented with stroke like symptoms, acute left sided facial droop, R sided weakness, chest pain, and severe HA. Pt denies smoking or drug use. Pt notes nausea with upright positioning.     Patient Active Problem List   Diagnosis    Chest pain    Stroke-like symptoms    Seizures    Facial droop    Tension type headache    History of stroke    Class 3 severe obesity in adult    Hematemesis    Blood loss anemia    HLD (hyperlipidemia)     Numbness and tingling    Hemorrhoids    Right hemiparesis    Nonspecific abnormal electroencephalogram (EEG)    Headache    Abdominal pain    Weakness    Cellulitis of other specified site    Wound dehiscence    Hypertensive urgency    History of seizure disorder    COVID-19    HTN (hypertension)    Right sided weakness    UTI (urinary tract infection)    Acute cystitis       Past Medical/Surgical History:  Past Medical History:   Diagnosis Date    Hypercholesteremia     Hypertension     Seizures     Stroke     2015 and 2017, right side    TIA (transient ischemic attack) 2017     Past Surgical History:   Procedure Laterality Date    APPENDECTOMY (OPEN)      CHOLECYSTECTOMY      EGD, BIOPSY N/A 04/16/2017    Procedure: EGD, BIOPSY;  Surgeon: Pershing Proud, MD;  Location: [facility];  Service: Gastroenterology;  Laterality: N/A;    HYSTERECTOMY         X-Rays/Tests/Labs:  Lab Results   Component Value Date/Time    HGB 13.2 03/15/2021 11:55 AM    HCT 41.3 03/15/2021 11:55 AM    K 3.6 07/29/2021 04:27  PM    NA 142 07/29/2021 04:27 PM    INR 1.1 03/14/2021 05:52 AM    TROPI 6.3 07/29/2021 04:27 PM    TROPI 3.0 03/13/2021 12:40 PM    TROPI 3.8 03/12/2021 12:29 PM    TROPI calc n/a 03/12/2021 12:29 PM    TROPI 4.1 03/11/2021 11:48 PM    TROPI 0.1 03/11/2021 11:48 PM       All imaging reviewed, please see chart for details.    Social History:  Prior Level of Function  Prior level of function: Independent with ADLs, Ambulates independently  Baseline Activity Level: Community ambulation  Driving: independent  Cooking: Yes  Employment: Unemployed  DME Currently at Home:  (NA)    Home Living Arrangements  Living Arrangements: Spouse/significant other, Children (Husband and daughter)  Type of Home: House  Home Layout: Multi-level (12 STE, 18 stiars in the home with HR)  Bathroom Shower/Tub: Electrical engineer:  (NA)  Bathroom Accessibility: Accessible  DME Currently at Home:   (NA)  Home Living - Notes / Comments: Husband will be able to assist at home.      Subjective:  Patient is agreeable to participation in the therapy session. Nursing clears patient for therapy.  Patient Goal: To get rid of HA  Pain Assessment  Pain Assessment: Numeric Scale (0-10)  Pain Score: 8-severe pain  Pain Location: Chest  Pain Orientation: Mid  Pain Frequency: Constant/continuous  Effect of Pain on Daily Activities: severe  Pain Intervention(s): Repositioned;Relaxation technique;Therapeutic presence  Multiple Pain Sites: Yes      Objective:  Observation of Patient/Vital Signs:  BP 154/86   Pulse 78  SpO2 94%        Cognitive Status and Neuro Exam:  Cognition/Neuro Status  Arousal/Alertness: Appropriate responses to stimuli  Attention Span: Appears intact  Orientation Level: Oriented X4  Memory: Appears intact  Following Commands: Follows all commands and directions without difficulty  Safety Awareness: minimal verbal instruction  Insights: Fully aware of deficits  Problem Solving: Assistance required to identify errors made;minimal assistance  Behavior: cooperative;anxious;fearful (fearful of increased pain and nausea)  Motor Planning: intact    Musculoskeletal Examination  Gross ROM  Right Upper Extremity ROM: within functional limits  Left Upper Extremity ROM: within functional limits  Right Lower Extremity ROM: within functional limits  Left Lower Extremity ROM: within functional limits  Gross Strength  Right Upper Extremity Strength: 4-/5  Left Upper Extremity Strength: 5/5  Right Lower Extremity Strength: 3+/5 (PF/DF 4/5)  Left Lower Extremity Strength: 5/5       Functional Mobility:  Functional Mobility  Rolling: Supervision  Supine to Sit: Minimal Assist;Increased Time;Increased Effort  Scooting to HOB: Contact Guard Assist  Scooting to EOB: Stand by Assist  Sit to Supine: Contact Guard Assist (assist with LE)  Sit to Stand: Unable to assess (Comment) (Pt became nauseous and experienced emesis  episode @ EOB.)     Locomotion  Ambulation: Unable to assess (Comment) (pt becane nauseous and had emesis episode at EOB.)     Balance  Balance  Balance: needs focused assessment  Sitting - Static: Good  Sitting - Dynamic: Fair    Participation and Activity Tolerance  Participation and Endurance  Participation Effort: good  Endurance: Tolerates 10 - 20 min exercise with multiple rests    Educated the Patient to role of physical therapy, plan of care, goals of therapy and HEP, safety with mobility and ADLs, energy conservation techniques with  verbalized understanding  and demonstrated understanding.    Patient left in bed with alarm and all other medical equipment in place and call bell and all personal items/needs within reach.  RN notified of session outcome.        Assessment:  [name] Cook is a 46 y.o. female admitted 07/29/2021.  PT Assessment  Assessment: Decreased UE strength;Decreased LE strength;Decreased endurance/activity tolerance;Decreased functional mobility  Prognosis: Good;With continued PT status post acute discharge  Progress: Slow progress, medical status limitations    Pt demo nausea and emesis episode when sitting up at the EOB. Unable to assess standing and ambulation. Pt would benefit from cont skilled PT intervention and OOB assessment. Pt HA and nausea limiting factor to mobility.     Treatment:  Pt ed re seated posture and body mechanics  Ankle pumps 10 reps B LE   SLR 5 reps 3 sec hold       Plan:  Treatment/Interventions: Exercise, Gait training, Neuromuscular re-education, Functional transfer training, LE strengthening/ROM, Endurance training, Patient/family training, Bed mobility, Compensatory technique education, Equipment eval/education, Continued evaluation  PT Frequency: 1-2x/wk  Risks/Benefits/POC Discussed with Pt/Family: With patient    PMP Activity: Step 4 - Dangle at Bedside  Distance Walked (ft) (Step 6,7): 0 Feet      Goals:  Goals  Goal Formulation: With patient  Time for  Goal Acheivement: By time of discharge  Pt Will Roll Left: modified independent, to maximize functional mobility and independence  Pt Will Roll Right: modified independent, to maximize functional mobility and independence  Pt Will Go Supine To Sit: with supervision, to maximize functional mobility and independence  Pt Will Perform Sit To Supine: with supervision, to maximize functional mobility and independence  Pt Will Sit Edge of Bed: modified independent, to maximize functional mobility and independence  Pt Will Achieve Sitting Balance: 4+/5 moves/returns trunkal midpoint 1-2 inches in multiple planes, to maximize functional mobility and independence    Mellody Dance PT, DPT    07/30/2021 12:34 PM      [facility]  Patient: [name] Cook MRN#: [id number]  Unit: [facility] Bed: A2505/A2505-B
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
## 2021-07-30 NOTE — Progress Notes (Signed)
[facility]   HOSPITALIST PROGRESS NOTE    Date Time: 07/30/21 4:02 PM  Patient Name: [name] Cook  Attending Physician: Viviann Spare, MDMD      Assessment:     Active Hospital Problems    Diagnosis    HTN (hypertension)    Hypertensive urgency    Paresthesia of right arm and leg    Morbid obesity with BMI of 45.0-49.9, adult    Tension type headache    Facial droop    Seizures     Plan:   Right-sided paresthesia - patient presented to the hospital with complaints of headache, paresthesia, patient was placed on aspirin and statins, seen by neurology team who recommended getting MRI of the brain, MRI as well as MRA of head and neck came back negative for any acute issues, currently still having elevated blood pressure, symptoms seem to be fluctuating, continue monitoring.   History of seizures -patient is on Keppra, continue monitoring  Morbid obesity -BMI of 46.6, patient is advised to consume diet consistent mainly of vegetables, advised outpatient dietary consultation.   Hypertensive urgency -patient states she only take 1 blood pressure medication, blood pressure in the hospital was noted to be elevated, will adjust blood pressure medications, follow closely.       GI prophylaxis: None    Subjective     CC: Facial droop    Interval History/24 hour events: Still complaining of having numbness, no chest pain, no change in vision, able to tolerate diet.       HPI: HPI per Admitting Provider   " [name] Cook is a 46 y.o. female with a PMH of CVA, seizures, hypertension, hyperlipidemia presenting to the ED for facial droop.  Reports right-sided facial droop noted by her husband yesterday at about 5 PM.  Also reports right arm and right leg weakness, numbness and tingling.  At that time she also developed chest pain and shortness of breath which has been intermittent since yesterday.  She presented to [facility] yesterday, CT head showed no acute findings.  Due to lack of beds, she was  transferred to [facility] for further evaluation.  States she took her medications this morning.  She has a history of severe iodine contrast allergy.  Reports recent stress due to her niece passing away on Sunday.   Denies syncope, fever, chills, leg swelling, abdominal pain, nausea, vomiting, diarrhea, dysuria. "     Hospital course:     Review of Systems:   General - no pain   Resp - no SOB   CVS - no CP, no CAD   GI - no nausea, no diarrhea   CNS - no visual deficit, no weakness    Physical Exam:     Vitals:    07/30/21 1048 07/30/21 1147 07/30/21 1532 07/30/21 1554   BP: (!) 164/101 154/86 (!) 200/115 162/90   Pulse: 77 78  89   Resp:  18     Temp:  97.9 F (36.6 C)  97.7 F (36.5 C)   TempSrc:  Oral  Oral   SpO2:  94%  96%   Weight:       Height:              Intake/Output Summary (Last 24 hours) at 07/30/2021 1602  Last data filed at 07/30/2021 0533  Gross per 24 hour   Intake 360 ml   Output 300 ml   Net 60 ml       General:  Awake, alert, oriented x 3; no acute distress.  HEENT: PERRLA, eomi, sclera anicteric  oropharynx clear without lesions, mucous membranes moist  Neck: supple, no lymphadenopathy, no thyromegaly, no JVD, no carotid bruits  Cardiovascular: regular rate and rhythm, no murmurs, rubs or gallops  Lungs: clear to auscultation bilaterally, without wheezing, rhonchi, or rales  Abdomen: soft, non-tender, non-distended; no palpable masses, no hepatosplenomegaly, normoactive bowel sounds, no rebound or guarding  Extremities: no clubbing, cyanosis, or edema  Neuro: cranial nerves grossly intact, strength 5/5 in upper and lower extremities, sensation intact, gross visual field test intact.  Skin: no rashes or lesions noted  GU: no CVA tenderness     Meds:   Medications were reviewed by me:  Current Facility-Administered Medications   Medication Dose Route Frequency    amLODIPine  5 mg Oral Daily    aspirin  81 mg Oral Daily    atorvastatin  40 mg Oral Daily    enoxaparin  40 mg Subcutaneous Daily     levETIRAcetam  500 mg Oral BID    lisinopril  40 mg Oral Daily    pantoprazole  40 mg Oral QAM AC       Labs:   Labs reviewed personally include:           Recent Labs   Lab 07/29/21  1627   Sodium 142   Potassium 3.6   Chloride 107   CO2 26   BUN 10.0   Creatinine 0.9   eGFR >60.0   Glucose 91   Calcium 9.2    Recent Labs   Lab 07/29/21  1627   Alkaline Phosphatase 127*   Bilirubin, Total 1.3*   Protein, Total 8.4*   Albumin 4.0   ALT 26   AST (SGOT) 20            Microbiology Results (last 15 days)       Procedure Component Value Units Date/Time    COVID-19 (SARS-CoV-2) and Influenza A/B, NAA (Liat Rapid)- Admission [[id number]] Collected: 07/29/21 1627    Order Status: Completed Specimen: Culturette from Nasopharyngeal Updated: 07/29/21 1658     Purpose of COVID testing Diagnostic -PUI     SARS-CoV-2 Specimen Source Nasal Swab     SARS CoV 2 Overall Result Not Detected     Comment: __________________________________________________  -A result of "Detected" indicates POSITIVE for the    presence of SARS CoV-2 RNA  -A result of "Not Detected" indicates NEGATIVE for the    presence of SARS CoV-2 RNA  __________________________________________________________  Test performed using the Roche cobas Liat SARS-CoV-2 assay. This assay is  only for use under the Food and Drug Administration's Emergency Use  Authorization. This is a real-time RT-PCR assay for the qualitative  detection of SARS-CoV-2 RNA. Viral nucleic acids may persist in vivo,  independent of viability. Detection of viral nucleic acid does not imply the  presence of infectious virus, or that virus nucleic acid is the cause of  clinical symptoms. Negative results do not preclude SARS-CoV-2 infection and  should not be used as the sole basis for diagnosis, treatment or other  patient management decisions. Negative results must be combined with  clinical observations, patient history, and/or epidemiological information.  Invalid results may be due to  inhibiting substances in the specimen and  recollection should occur. Please see Fact Sheets for patients and providers  located:  WirelessDSLBlog.no          Influenza A Not Detected     Influenza  B Not Detected     Comment: Test performed using the Roche cobas Liat SARS-CoV-2 & Influenza A/B assay.  This assay is only for use under the Food and Drug Administration's  Emergency Use Authorization. This is a multiplex real-time RT-PCR assay  intended for the simultaneous in vitro qualitative detection and  differentiation of SARS-CoV-2, influenza A, and influenza B virus RNA. Viral  nucleic acids may persist in vivo, independent of viability. Detection of  viral nucleic acid does not imply the presence of infectious virus, or that  virus nucleic acid is the cause of clinical symptoms. Negative results do  not preclude SARS-CoV-2, influenza A, and/or influenza B infection and  should not be used as the sole basis for diagnosis, treatment or other  patient management decisions. Negative results must be combined with  clinical observations, patient history, and/or epidemiological information.  Invalid results may be due to inhibiting substances in the specimen and  recollection should occur. Please see Fact Sheets for patients and providers  located: http://www.rice.biz/.         Narrative:      o Collect and clearly label specimen type:  o PREFERRED-Upper respiratory specimen: One Nasal Swab in  Transport Media.  o Hand deliver to laboratory ASAP  Diagnostic -PUI            Imaging personally reviewed, including:  Radiology Results (24 Hour)       Procedure Component Value Units Date/Time    MR Angiogram Neck WO Contrast [[id number]] Collected: 07/30/21 0420    Order Status: Completed Updated: 07/30/21 0437    Narrative:      CLINICAL HISTORY:  Neuro deficit, acute, stroke suspected    EXAMINATION:  MR ANGIOGRAM NECK WO CONTRAST    COMPARISON:  03/12/2021      TECHNIQUE:    3-D time-of-flight cervical MRA is performed. Images are reviewed as raw source data images, maximum intensity projection, and multiplanar re-formations on a separate workstation    CONTRAST:  None    FINDINGS:    Aortic arch: Within normal limits.    Right:    Carotid: No flow-limiting stenosis, dissection, or aneurysm.  Vertebral: No flow-limiting stenosis, dissection, or aneurysm.    Left:    Carotid: No flow-limiting stenosis, dissection, or aneurysm.  Vertebral: No flow-limiting stenosis, dissection, or aneurysm.        Impression:         No evidence for hemodynamically significant stenosis in bilateral carotid and vertebral arteries    Nicki Reaper, MD  07/30/2021 4:35 AM    MR Angiogram Head WO Contrast [[id number]] Collected: 07/30/21 0417    Order Status: Completed Updated: 07/30/21 0422    Narrative:      CLINICAL HISTORY:  Neuro deficit, acute, stroke suspected    EXAMINATION:  MR ANGIOGRAM HEAD WO CONTRAST    COMPARISON:  none     TECHNIQUE:  MR angiograms through the Circle of Willis region were done using 3D time of flight method. .    CONTRAST:  None    FINDINGS:    ACA: Trifurcation is incidentally noted.  A-com: unremarkable  MCA: unremarkable    PCA: unremarkable  P-com: unremarkable    Vertebral: unremarkable  Basilar: unremarkable          Impression:         No focal vascular abnormalities are identified in the Circle of Willis region based on this MRA study.    Nicki Reaper, MD  07/30/2021 4:20 AM  MRI brain without contrast [[id number]] Collected: 07/30/21 0407    Order Status: Completed Updated: 07/30/21 0419    Narrative:      CLINICAL HISTORY:  Neuro deficit, acute, stroke suspected    EXAMINATION:  MRI BRAIN WO CONTRAST    COMPARISON:  Head CT from 03/11/2021, brain MRI from 03/12/2021.    TECHNIQUE:  Multiplanar, multiecho imaging of the brain is performed.      CONTRAST:  None    FINDINGS:    No intracranial mass lesions are seen.    No intracranial zones with abnormal  restricted diffusion are present. There is no evidence for acute cerebral or cerebellar infarction.    There is no evidence of intracranial hemorrhage or abnormal subdural collections.    3 tiny (measuring up to 5 mm) foci of high T2 signal in the deep and subcortical white matter of left frontal lobe, without abnormal mass effect, abnormal restricted diffusion are unchanged. These findings are nonspecific and may be seen with   demyelination, small vessel ischemic changes, or posttraumatic and/or remote inflammatory changes.    The ventricles are within normal limits in size, position, and configuration for the patient's age    The craniovertebral junction is unremarkable.     Visible intracranial vessels have flow void indicating patency.    Visualized mastoid air cells appear normal.    Paranasal sinuses are clear.    Visualized  orbits are within normal limits.    Marrow has normal signal characteristics.    Soft tissues are within normal limits.        Impression:       No acute findings or substantial change    Nicki Reaper, MD  07/30/2021 4:17 AM    US Venous Low Extrem Duplx Dopp Comp Bilat [[id number]] Collected: 07/29/21 2352    Order Status: Completed Updated: 07/29/21 2355    Narrative:      CLINICAL HISTORY:  CHEST PAIN, ACUTE, PE SUSPECTED      EXAMINATION:  US VENOUS LOW EXTREM DUPLX DOPP COMP BILAT    COMPARISON:  03/15/2021     TECHNIQUE:  Compression gray scale, Spectral Doppler and Color Doppler ultrasound were used to evaluate lower extremity deep venous system.  Exam is limited due to patient's body habitus.    FINDINGS:     Common femoral vein: Normal flow. Normal compression.    Femoral vein: Normal flow. Normal compression.    Popliteal vein: Normal flow. Normal compression.    Posterior tibial vein: Normal flow. Normal compression.    Anterior tibial vein: Normal flow. Normal compression.    Peroneal vein: Normal flow. Normal compression.          Impression:        No evidence of deep venous  thrombosis    Nicki Reaper, MD  07/29/2021 11:53 PM    NM Spect CT Fusion Sgl Area Sgl Day [[id number]] Collected: 07/29/21 1940    Order Status: Completed Updated: 07/29/21 1948    Narrative:      Study: NM SPECT CT FUSION SGL AREA SGL DAY, NM PULMONARY SCAN PERFUSION PARTICULATE    Date completed: 07/29/2021 6:23 PM    Clinical information: r/o pe    Comparison: None available.    Technique: SPECT-CT fusion and pulmonary scan perfusion was performed. Tc59m MAA 6.6 mCi IV    Findings:  Perfusion images are homogeneous with no wedge-shaped perfusion defects. SPECT-CT fusion shows uniform hypermetabolic activity. No areas of mismatch. There is bibasilar atelectasis.  Impression:        Low probability of pulmonary embolism.    Nelya Ebadirad  07/29/2021 7:46 PM    NM Lung Scan Perfusion Particulate [[id number]] Collected: 07/29/21 1940    Order Status: Completed Updated: 07/29/21 1948    Narrative:      Study: NM SPECT CT FUSION SGL AREA SGL DAY, NM PULMONARY SCAN PERFUSION PARTICULATE    Date completed: 07/29/2021 6:23 PM    Clinical information: r/o pe    Comparison: None available.    Technique: SPECT-CT fusion and pulmonary scan perfusion was performed. Tc34m MAA 6.6 mCi IV    Findings:  Perfusion images are homogeneous with no wedge-shaped perfusion defects. SPECT-CT fusion shows uniform hypermetabolic activity. No areas of mismatch. There is bibasilar atelectasis.      Impression:        Low probability of pulmonary embolism.    Nelya Ebadirad  07/29/2021 7:46 PM              Safety Checklist:     DVT prophylaxis:  CHEST guideline (See page [id number]) Chemical   Foley:  [place] R[facility] Foley protocol Not present   IVs:  Peripheral IV   PT/OT: Ordered   Daily CBC & or Chem ordered:  SHM/ABIM guidelines (see #5) No   Reference for approximate charges of common labs: CBC auto diff - $76  BMP - $99  Mg - $79    Lines:     Patient Lines/Drains/Airways Status       Active PICC Line / CVC Line / PIV Line / Drain / Airway /  Intraosseous Line / Epidural Line / ART Line / Line / Wound / Pressure Ulcer / NG/OG Tube       Name Placement date Placement time Site Days    Peripheral IV 07/29/21 22 G Diffusion Anterior;Right Hand 07/29/21  1446  Hand  less than 1    Peripheral IV 07/29/21 20 G Anterior;Proximal;Right Forearm 07/29/21  1626  Forearm  less than 1    Wound 10/15/19 Other (Comment) Abdomen Medial;Right open 10/15/19  2100  Abdomen  653                     Disposition:   Today's date: 07/30/2021  Length of Stay: 1  Anticipated medical stability for discharge: May,  21 - Afternoon  Reason for ongoing hospitalization: Right-sided paresthesia  Anticipated discharge needs: Home    Signed by: Viviann Spare, MD

## 2021-07-30 NOTE — SLP Eval Note (Signed)
[facility]  Speech and Language Therapy Bedside Swallow Evaluation     Patient:  [name] Cook MRN#:  [id number]  Unit:  25 [facility] Room/Bed:  A2505/A2505-B    Time of Treatment:   Time Calculation  SLP Received On: 07/30/21  Start Time: 1115  Stop Time: 1135  Time Calculation (min): 20 min    Consult received for [name] Cook for SLP Bedside Swallow Evaluation and Treatment.    Medical Diagnosis: Facial droop [R29.810]    History of Present Illness: [name] Cook is a 46 y.o. female with a PMH of CVA, seizures, hypertension, hyperlipidemia presenting to the ED for facial droop.  Reports right-sided facial droop noted by her husband yesterday at about 5 PM.  Also reports right arm and right leg weakness, numbness and tingling.  At that time she also developed chest pain and shortness of breath which has been intermittent since yesterday.  She presented to [facility] yesterday, CT head showed no acute findings.  Due to lack of beds, she was transferred to [facility] for further evaluation.  States she took her medications this morning.  She has a history of severe iodine contrast allergy.  Reports recent stress due to her niece passing away on Sunday.   Denies syncope, fever, chills, leg swelling, abdominal pain, nausea, vomiting, diarrhea, dysuria.    Chest AP Portable  IMPRESSION:      Linear opacities at the right lung base may represent subsegmental atelectasis, however, developing pneumonia cannot be excluded.     Chong Yi, MD  07/29/2021 3:59 PM    MRI Brain Contrast   IMPRESSION:    No acute findings or substantial change     Jason Shou, MD  07/30/2021 4:17 AM  Patient Active Problem List   Diagnosis    Chest pain    Stroke-like symptoms    Seizures    Facial droop    Tension type headache    History of stroke    Class 3 severe obesity in adult    Hematemesis    Blood loss anemia    HLD (hyperlipidemia)    Numbness and tingling    Hemorrhoids    Right hemiparesis     Nonspecific abnormal electroencephalogram (EEG)    Headache    Abdominal pain    Weakness    Cellulitis of other specified site    Wound dehiscence    Hypertensive urgency    History of seizure disorder    COVID-19    HTN (hypertension)    Right sided weakness    UTI (urinary tract infection)    Acute cystitis        Past Medical/Surgical History:  Past Medical History:   Diagnosis Date    Hypercholesteremia     Hypertension     Seizures     Stroke     20 15 and 2017, right side    TIA (transient ischemic attack) 2017      Past Surgical History:   Procedure Laterality Date    APPENDECTOMY (OPEN)      CHOLECYSTECTOMY      EGD, BIOPSY N/A 04/16/2017    Procedure: EGD, BIOPSY;  Surgeon: 06/14/2017, MD;  Location: [facility];  Service: Gastroenterology;  Laterality: N/A;    HYSTERECTOMY           History/Current Status:  Current Status  Behavior/Mental Status: Lethargic, Cooperative, Pleasant mood, Able to follow directions  Nutrition: oral    Subjective: Patient is agreeable  to participation in the therapy session. Nursing clears patient for therapy. Patient's medical condition is appropriate for Speech therapy intervention at this time.    Objective:  Observation of Patient/Vital Signs:  Patient is in bed with telemetry in place.    Oral Motor Skills:  Engineer, maintenance (IT)ral Motor Skills  Oral Motor Skills: within functional limits  Oral Motor Impairments: other (comment) (L Facial Droop)    Deglutition Skills:  Deglutition Skills  Position: upright 90 degrees  Food(s) Tested: thin liquid, puree, soft solid, solid  Oral Stage: adequate  Pharyngeal Stage: adequate  Esophageal Stage: appears WFL    Assessment:   Pt seen at bedside for swallow evaluation. Pt appeared lethargic but was pleasant and cooperative w/ SLP. Pt reported no trouble and/or concerns w/ swallow function. Nursing reported no concerns w/ swallow. Pt presented w/ reduced labial ROM d/t L facial droop during oral motor exam- droop did not appear to impact pt  during PO trials.    Pt trialed w/ approx 3oz of thin liquids via straw, puree via tsp x5, mech soft solids x4 and regular solids x5.     Pt demonstrated WFL swallow. Adequate labial seal, AP transit, mastication on solids, swallow initiation, hyolaryngeal excursion and apparent pharyngeal clearance. Pt self administered all PO trials and demonstrated no overt s/s of aspiration across consistencies. SLP reviewed safe swallow precautions w/ pt prior to departure.     Goals:   1. Pt will continue to implement safe swallow strategies w/ regular/thin diet to ensure no overt s/s of aspiration occur in 100% of opportunities.     Plan/Recommendations:   Continue regular/thin diet w/ safe swallow precautions in place        Diet Solids Recommendation: regular  Diet Liquids Recommendations: thin consistency  Precautions/Compensations: Awake/alert, Upright 90 degrees for all oral intake, 45 degrees upright after meals, Alternate solids and liquids, Swallow multiple times per bite/sip, Small bites/sips, Eat/feed slowly  Recommendation Discussed With: : Patient  SLP Frequency Recommended: monitor status  Administration of Medications: PO  Aspiration Precautions posted at bedside: reviewed w/ pt    07/30/2021  M. Marquis BuggyShea Idan Prime, M.A. CCC-SLP  [employer] [id number]  Department # [id number]

## 2021-07-30 NOTE — Plan of Care (Signed)
NURSING SHIFT NOTE     Patient: [name] Cook  Day: 1      SHIFT EVENTS     Shift Narrative/Significant Events (PRN med administration, fall, RRT, etc.):     A&O X 4, C/O  HA,  Hydromorphone IV given as ordered. SR on the tele monitor. On RA.  Assisted with her needs. Safety and fall precautions remain in place. Purposeful rounding completed. Will continue to monitor.          ASSESSMENT     Changes in assessment from patient's baseline this shift:    Neuro: No  CV: No  Pulm: No  Peripheral Vascular: No  HEENT: No  GI: No  BM during shift: No, Last BM: Last BM Date: 07/29/21  GU: No   Integ: No  MS: No    Pain: Improved  Pain Interventions: Medications  Medications Utilized: Dilaudid intravenous       CARE PLAN        Problem: Moderate/High Fall Risk Score >5  Goal: Patient will remain free of falls  Outcome: Progressing  Flowsheets (Taken 07/30/2021 0800)  Moderate Risk (6-13): MOD-Consider activation of bed alarm if appropriate     Problem: Every Day - Stroke  Goal: Neurological status is stable or improving  Outcome: Progressing  Flowsheets (Taken 07/30/2021 1207)  Neurological status is stable or improving:   Monitor/assess/document neurological assessment (Stroke: every 4 hours)   Re-assess NIH Stroke Scale for any change in status   Perform CAM Assessment   Observe for seizure activity and initiate seizure precautions if indicated     Problem: Hemodynamic Status: Cardiac  Goal: Stable vital signs and fluid balance  Outcome: Progressing  Flowsheets (Taken 07/30/2021 1207)  Stable vital signs and fluid balance:   Monitor/assess vital signs and telemetry per unit protocol   Assess signs and symptoms associated with cardiac rhythm changes   Monitor lab values   Weigh on admission and record weight daily     Problem: Pain  Goal: Pain at adequate level as identified by patient  Outcome: Progressing  Flowsheets (Taken 07/30/2021 1208)  Pain at adequate level as identified by patient:   Identify patient comfort function  goal   Assess for risk of opioid induced respiratory depression, including snoring/sleep apnea. Alert healthcare team of risk factors identified.   Assess pain on admission, during daily assessment and/or before any "as needed" intervention(s)   Reassess pain within 30-60 minutes of any procedure/intervention, per Pain Assessment, Intervention, Reassessment (AIR) Cycle   Evaluate if patient comfort function goal is met   Evaluate patient's satisfaction with pain management progress     Problem: Safety  Goal: Patient will be free from injury during hospitalization  Outcome: Progressing  Flowsheets (Taken 07/30/2021 1208)  Patient will be free from injury during hospitalization:   Assess patient's risk for falls and implement fall prevention plan of care per policy   Provide and maintain safe environment   Use appropriate transfer methods   Hourly rounding   Include patient/ family/ care giver in decisions related to safety   Ensure appropriate safety devices are available at the bedside

## 2021-07-30 NOTE — OT Eval Note (Addendum)
Occupational Therapy Eval [name]        Post Acute Care Therapy Recommendations   Discharge Recommendations:  Home with supervision, Home with home health OT, Home with home health PT (pending further progression with mobility and ADL's)  D/C Milestones: further ambulation, ADL's, stairs     Anticipate achievement in 1-2 sessions      DME needs IF patient is discharging home: Shower chair    Therapy discharge recommendations may change with patient status.  Please refer to most recent note for up-to-date recommendations.    Unit: [address] [facility] [facility]  Bed: A2505/A2505-B      ___________________________________________________    Time of Evaluation:  Time Calculation  OT Received On: 07/30/21  Start Time: 1507  Stop Time: 1525  Time Calculation (min): 18 min    Chart Review and Collaboration with Care Team: 8 minutes, not included in above time.    OT Visit Number: 1    Consult received for [name] for OT Evaluation and Treatment.  Patient's medical condition is appropriate for Occupational therapy intervention at this time.      Activity Orders:  OT eval and treat and progressive mobility protocol    Precautions and Contraindications:  Precautions  Weight Bearing Status: no restrictions  Other Precautions: falls, BP    Personal Protective Equipment (PPE)  gloves and procedure mask    Medical Diagnosis:  Facial droop [R29.810]    History of Present Illness: Osmara Drummonds is a 46 y.o. female admitted on 07/29/2021 with right facial droop and elevated BP as well as R LE numbness and tingling.  CT head negative - pt transferred from [facility] to [facility] for further evaluation.  MRI brain negative.    Patient Active Problem List   Diagnosis    Chest pain    Stroke-like symptoms    Seizures    Facial droop    Tension type headache    History of stroke    Class 3 severe obesity in adult    Hematemesis    Blood loss anemia    HLD (hyperlipidemia)    Numbness and tingling    Hemorrhoids    Right  hemiparesis    Nonspecific abnormal electroencephalogram (EEG)    Headache    Abdominal pain    Weakness    Cellulitis of other specified site    Wound dehiscence    Hypertensive urgency    History of seizure disorder    COVID-19    HTN (hypertension)    Right sided weakness    UTI (urinary tract infection)    Acute cystitis       Past Medical/Surgical History:  Past Medical History:   Diagnosis Date    Hypercholesteremia     Hypertension     Seizures     Stroke     2015 and 2017, right side    TIA (transient ischemic attack) 2017      Past Surgical History:   Procedure Laterality Date    APPENDECTOMY (OPEN)      CHOLECYSTECTOMY      EGD, BIOPSY N/A 04/16/2017    Procedure: EGD, BIOPSY;  Surgeon: Pershing Proud, MD;  Location: [facility];  Service: Gastroenterology;  Laterality: N/A;    HYSTERECTOMY           X-Rays/Tests/Labs:  Lab Results   Component Value Date/Time    HGB 13.2 03/15/2021 11:55 AM    HCT 41.3 03/15/2021 11:55 AM    K  3.6 07/29/2021 04:27 PM    NA 142 07/29/2021 04:27 PM    INR 1.1 03/14/2021 05:52 AM    TROPI 6.3 07/29/2021 04:27 PM    TROPI 3.0 03/13/2021 12:40 PM    TROPI 3.8 03/12/2021 12:29 PM    TROPI calc n/a 03/12/2021 12:29 PM    TROPI 4.1 03/11/2021 11:48 PM    TROPI 0.1 03/11/2021 11:48 PM       All imaging reviewed, please see chart for details.    Social History:  Prior Level of Function  Prior level of function: Independent with ADLs, Ambulates independently  Baseline Activity Level: Community ambulation  Driving: independent  Dressing - Upper Body: independent  Dressing - Lower Body: independent  Cooking: Yes  Feeding: independent  Bathing: independent  Grooming: independent  Toileting: independent  Employment: Unemployed  DME Currently at Home: Environmental consultant, [employer] ("we have all that for the bathroom" regarding DME but is not specific)    Home Living Arrangements  Living Arrangements: Spouse/significant other, Children  Type of Home: House  Home Layout: Multi-level  Bathroom  Shower/Tub: Medical sales representative: Midwife:  (NA)  Bathroom Accessibility: Accessible  DME Currently at Home: Walker, [employer] ("we have all that for the bathroom" regarding DME but is not specific)  Home Living - Notes / Comments: Husband will be able to assist at home.  Husband reports pt was at inpatient rehab in January     Subjective:      Patient is agreeable to participation in the therapy session. Nursing clears patient for therapy.     Pain Assessment  Pain Assessment:  (complains of headache)      Objective:   Inspection/Posture  Inspection/Posture: Pt in bed    Observation of Patient/Vital Signs:  BP elevated 175/120- RN made aware    Cognitive Status and Neuro Exam:  Cognition/Neuro Status  Arousal/Alertness: Appropriate responses to stimuli  Attention Span: Appears intact  Orientation Level: Oriented X4  Memory: Appears intact  Following Commands: independent  Safety Awareness: independent  Behavior: flat affect (pt appears irritable and frustrated)  Motor Planning: intact  Coordination: intact    Musculoskeletal Examination  Gross ROM  Right Upper Extremity ROM: within functional limits  Left Upper Extremity ROM: within functional limits  Right Lower Extremity ROM: within functional limits  Left Lower Extremity ROM: within functional limits  Gross Strength  Right Upper Extremity Strength: 3+/5  Left Upper Extremity Strength: 4-/5       Sensory/Oculomotor Examination  Sensory  Auditory: intact  Tactile - Light Touch:  (pt reports numbness in right LE foot)         Activities of Daily Living  Self-care and Home Management  Eating: Independent  LB Dressing: Supervision, edge of bed    Functional Mobility:  Mobility and Transfers  Supine to Sit: Independent  Sit to Supine: Independent  Sit to Stand:  (not tested - although RN reports pt has ambulated to the bathroom)   Pt reports that staff and husband have assisted pt to the bathroom but that her gait does not feel  normal to her    Balance  Balance  Static Sitting Balance: good  Dyanamic Sitting Balance: good  Static Standing Balance:  (NT)    Participation and Activity Tolerance  Participation and Endurance  Participation Effort: good    Educated the Patient to role of occupational therapy, plan of care, goals of therapy and safety with mobility and ADLs, home safety  with verbalized understanding .    Patient left in bed with RN present to give BP medications- family at bedside.      Assessment:  Vali Capano is a 46 y.o. female admitted 07/29/2021. OT Assessment:  (unable to assess OOB due to elevated BP - RN made aware of elevated BP)    Rehabilitation Potential:  Prognosis: Good    Plan:  OT Frequency Recommended: 1-2x/wk   Treatment Interventions: ADL retraining, Functional transfer training, Patient/Family training, Equipment eval/education     PMP Activity: Step 6 - Walks in Room (per RN- pt has been ambulating to the bathroom)  Distance Walked (ft) (Step 6,7): 0 Feet      Risks/benefits/POC discussed    Goals:  Time For Goal Achievement: by time of discharge  ADL Goals  Patient will groom self: Independent, at sinkside  Patient will dress upper body: Independent  Patient will dress lower body: Supervision  Patient will toilet: Independent  Mobility and Transfer Goals  Other Goal: Pt will ambulate to and from bathroom with mod I and Gwenyth Bender Izack Hoogland OTR/L   07/30/2021 3:43 PM      [facility]  Patient: Breeann Reposa MRN#: [id number]   Unit: [facility] Bed: A2505/A2505-B

## 2021-07-30 NOTE — Progress Notes (Addendum)
[facility] Consultation Note                                       Date Time: 07/30/21 6:12 PM  Patient Name: [name] Cook  Requesting Physician: Viviann Spare, MD  Date of Admission: 07/29/2021    CC / Reason for Consultation: facial droop, right sided weakness       Assessment:     45y/o woman hx of seizure, multiple admissions with stroke like symptoms and negative MRIs (since 2017), pt reports history of stroke in 2017 at OSH (w/ R facial weakness at that time), HTN, HLD, seizures (Keppra; last sz 8 yrs ago) transferred from [facility] for workup of chest pain and right sided weakness/numbness and headache.     Exam with effort dependent weakness on right side and subjective sensory loss of right face, arm and leg that splits the midline. CT head from OSH reported to be negative for acute process. Mri brain negative for any acute change, stable compared to Mri brain from 02/2021 with mild white matter changes L frontal lobe. MRA H/N were unremarkable.    Patient's main complaint currently is headache. She denies a history of migraine though there is a history of such noted in her chart.     Potentially status migrainosus w/ recrudescence of prior stroke symptoms vs some functional R facial weakness given her exam inconsistencies.    Plan:     --for headaches, would trial magnesium oxide 400-500mg  1-2 times daily, dexamethasone 8mg  IV then pred 60mg  daily for 4 days, reglan/benadryl IV (has allergy to NSAIDs so will avoid toradol)  --can continue home daily aspirin and statin  --would benefit from psychiatry follow up  -- would benefit from sleep apnea testing/evaluation as outpatient    Interval events/history     States she woke up with the R facial weakness, didn't notice any RUE/RLE weakness; states it is the same as the prior time she was told she had a stroke in 2017. Headache x2 days.   Headache continues, non positional. It has been constant over last two days and involves  her whole head. Denies vision issues.  Takes a daily aspirin at home.   She has been getting dilaudid IV PRN here, got some magnesium.     Past Medical Hx     Past Medical History:   Diagnosis Date    Hypercholesteremia     Hypertension     Seizures     Stroke     2015 and 2017, right side    TIA (transient ischemic attack) 2017          Past Surgical Hx:     Past Surgical History:   Procedure Laterality Date    APPENDECTOMY (OPEN)      CHOLECYSTECTOMY      EGD, BIOPSY N/A 04/16/2017    Procedure: EGD, BIOPSY;  Surgeon: Pershing Proud, MD;  Location: [facility];  Service: Gastroenterology;  Laterality: N/A;    HYSTERECTOMY          Family Medical History:      Family History   Problem Relation Age of Onset    Myocardial Infarction Father 38    Deep vein thrombosis Father     No known problems Mother        Social Hx     Social History     Socioeconomic History  Marital status: Single   Tobacco Use    Smoking status: Never    Smokeless tobacco: Never   Vaping Use    Vaping status: Never Used   Substance and Sexual Activity    Alcohol use: No    Drug use: No       Meds     Home :   Prior to Admission medications    Medication Sig Start Date End Date Taking? Authorizing Provider   albuterol sulfate HFA (PROVENTIL) 108 (90 Base) MCG/ACT inhaler Inhale 2 puffs into the lungs every 4 (four) hours as needed for Wheezing or Shortness of Breath 03/13/20  Yes Lorenda Cahill, PA   amLODIPine (NORVASC) 5 MG tablet Take 1 tablet (5 mg total) by mouth daily 09/15/20  Yes Scarlette Ar, MD   aspirin 81 MG chewable tablet Chew 1 tablet (81 mg total) by mouth daily 09/19/17  Yes Matiana, Raiford Noble, MD   atorvastatin (LIPITOR) 40 MG tablet Take 1 tablet (40 mg total) by mouth daily 09/15/20  Yes Scarlette Ar, MD   LevETIRAcetam (KEPPRA PO) Take 500 mg by mouth 2 (two) times daily.      Yes [provider]   pantoprazole (PROTONIX) 40 MG tablet Take 1 tablet (40 mg total) by mouth daily 10/30/18  Yes Karlton Lemon, MD    lidocaine (LIDODERM) 5 % Place 1 patch onto the skin every 24 hours Remove & Discard patch within 12 hours or as directed by MD 09/18/20 07/29/21  Scarlette Ar, MD   lisinopril (ZESTRIL) 40 MG tablet Take 1 tablet (40 mg total) by mouth daily 09/15/20 07/29/21  Scarlette Ar, MD   meclizine (ANTIVERT) 25 MG tablet Take 1 tablet (25 mg total) by mouth 3 (three) times daily as needed for Nausea or Dizziness 09/18/20 07/29/21  Scarlette Ar, MD   multivitamin (MULTIVITAMIN) Tab Take 1 tablet by mouth daily 10/17/19 07/29/21  Scarlette Ar, MD      Inpatient :   Current Facility-Administered Medications   Medication Dose Route Frequency    amLODIPine  5 mg Oral Daily    aspirin  81 mg Oral Daily    atorvastatin  40 mg Oral Daily    enoxaparin  40 mg Subcutaneous Daily    levETIRAcetam  500 mg Oral BID    lisinopril  40 mg Oral Daily    pantoprazole  40 mg Oral QAM AC         Allergies    Contrast [iodinated contrast media], Fioricet [butalbital-apap-caffeine], Flexeril [cyclobenzaprine], Bentyl [dicyclomine], Fentanyl, Iodine, Motrin [ibuprofen], Percocet [oxycodone-acetaminophen], Shellfish-derived products, Toradol [ketorolac tromethamine], Tramadol, and Tylenol [acetaminophen]      Review of Systems     All other systems were reviewed and are negative except for that mentioned in the HPI    Physical Exam:   Temp:  [97.3 F (36.3 C)-97.9 F (36.6 C)] 97.7 F (36.5 C)  Heart Rate:  [58-89] 89  Resp Rate:  [17-20] 19  BP: (149-200)/(86-115) 162/90       General: in no acute distress. Cooperative with the exam  Neck: supple  CVS: warm and well perfused  Resp: no respiratory distress  Extremities: no pedal edema, no rashes noted    Neurological Examination:  MSE: A&Ox3 (exact hospital, age, month, year), speech fluent without word-finding difficulty or paraphasic errors, follows simple commands, can repeat a sentence  CN: EOMI, no nystagmus, VFFC, decreased LT R V1-V3, R facial drooping but when asked to smile she  smiles  first with the R side and then with the L side and nasolabial folds appear equal, no dysarthria, tongue midline.  Motor: She moves arms and legs equally in the bed; declines further exam as she feels nauseated      Labs:     Results       ** No results found for the last 24 hours. **            Rads:       MRI brain without contrast   Final Result    No acute findings or substantial change      Nicki Reaper, MD   07/30/2021 4:17 AM      MR Angiogram Head WO Contrast   Final Result       No focal vascular abnormalities are identified in the Circle of Willis region based on this MRA study.      Nicki Reaper, MD   07/30/2021 4:20 AM      MR Angiogram Neck WO Contrast   Final Result       No evidence for hemodynamically significant stenosis in bilateral carotid and vertebral arteries      Nicki Reaper, MD   07/30/2021 4:35 AM      US Venous Low Extrem Duplx Dopp Comp Bilat   Final Result      No evidence of deep venous thrombosis      Nicki Reaper, MD   07/29/2021 11:53 PM      NM Spect CT Fusion Sgl Area Sgl Day   Final Result      Low probability of pulmonary embolism.      Nelya Ebadirad   07/29/2021 7:46 PM      NM Lung Scan Perfusion Particulate   Final Result      Low probability of pulmonary embolism.      Nelya Ebadirad   07/29/2021 7:46 PM      Chest AP Portable   Final Result      Linear opacities at the right lung base may represent subsegmental atelectasis, however, developing pneumonia cannot be excluded.      [name], MD   07/29/2021 3:59 PM         Jerrye Beavers, MD  Neurology Attending  Available on Epic chat 7a-7p  Spectra: IAH [phone]

## 2021-07-30 NOTE — Plan of Care (Addendum)
NURSING SHIFT NOTE     Patient: [name] Cook  Day: 1      SHIFT EVENTS     Shift Narrative/Significant Events (PRN med administration, fall, RRT, etc.):   Miss [name] Cook refused the blood draw this morning.   She mentioned;" she does not want to be poked any more."   Miss [name] Cook is transferred from [facility] for chest pain, right sided facial drop, RUE/LE weakness, neck pain..     She has flat facial effect, Aox3-4, VSS, BP runs very high, on room air, no skin issues, just multiple tatoos, had complain of some pain around head , neck and chest, prn analgesic was given, had MRI done during this shift.     Had two episodes of vomiting,     Safety and fall precautions remain in place. Purposeful rounding completed.     FOUR EYES SKIN ASSESSMENT NOTE    [name] Cook  01-Jul-1975  [id number]    Braden Scale Score: 20    POC Initiated for Risk for Altered Skin Yes    Patient Assessed for Correct Mattress Surface Yes  *At risk patients with Braden Score less than 12 must be considered for specialty bed      If Wound/Pressure Injury present:  Wound/PI assessment documented in LDA (will be shown below): No    A  Was skin checked with incoming RN/PCT? Yes    [name] Knudsen, RN  Jul 30, 2021  1:08 AM    Second RN/PCT Name: Lanora Manis        Wound 10/15/19 Other (Comment) Abdomen Medial;Right open (Active)                   ASSESSMENT     Changes in assessment from patient's baseline this shift:    Neuro: No  CV: No  Pulm: No  Peripheral Vascular: No  HEENT: No  GI: No  BM during shift: No, Last BM: Last BM Date: 07/28/21  GU: No   Integ: No  MS: No    Pain: chest pian, neck pain, pain everywhere  Pain Interventions: Medications  Medications Utilized: Dilaudid intravenous    Mobility: PMP Activity: Step 4 - Dangle at Bedside of Distance Walked (ft) (Step 6,7): 0 Feet           Lines     Patient Lines/Drains/Airways Status       Active Lines, Drains and Airways       Name Placement date Placement time Site Days     Peripheral IV 07/29/21 22 G Diffusion Anterior;Right Hand 07/29/21  1446  Hand  less than 1    Peripheral IV 07/29/21 20 G Anterior;Proximal;Right Forearm 07/29/21  1626  Forearm  less than 1                         VITAL SIGNS     Vitals:    07/30/21 0001   BP: (!) 178/97   Pulse: 78   Resp: 19   Temp: 97.7 F (36.5 C)   SpO2: 95%       Temp  Min: 97.3 F (36.3 C)  Max: 98.1 F (36.7 C)  Pulse  Min: 55  Max: 78  Resp  Min: 14  Max: 20  BP  Min: 166/88  Max: 199/119  SpO2  Min: 94 %  Max: 97 %    No intake or output data in the 24 hours ending 07/30/21 0104  Amount achieved on incentive spirometer?: No data recorded      CARE PLAN        Problem: Moderate/High Fall Risk Score >5  Goal: Patient will remain free of falls  Outcome: Progressing     Problem: Day of Admission - Stroke  Goal: Core/Quality measure requirements - Admission  Outcome: Progressing     Problem: Every Day - Stroke  Goal: Core/Quality measure requirements - Daily  Outcome: Progressing  Goal: Neurological status is stable or improving  Outcome: Progressing  Goal: Stable vital signs and fluid balance  Outcome: Progressing  Goal: Patient will maintain adequate oxygenation  Outcome: Progressing  Goal: Patient's risk of aspiration will be minimized  Outcome: Progressing  Goal: Nutritional intake is adequate  Outcome: Progressing  Goal: Elimination patterns are normal or improving  Outcome: Progressing  Goal: Mobility/Activity is maintained at optimal level for patient  Outcome: Progressing  Goal: Skin integrity is maintained or improved  Outcome: Progressing  Goal: Neurovascular status is stable or improving  Outcome: Progressing  Goal: Effective coping demonstrated  Outcome: Progressing  Goal: Will be able to express needs and understand communication  Outcome: Progressing     Problem: Hemodynamic Status: Cardiac  Goal: Stable vital signs and fluid balance  Outcome: Progressing     Problem: Inadequate Tissue Perfusion  Goal: Adequate tissue  perfusion will be maintained  Outcome: Progressing     Problem: Ineffective Gas Exchange  Goal: Effective breathing pattern  Outcome: Progressing     Problem: Nutrition  Goal: Nutritional intake is adequate  Outcome: Progressing     Problem: Impaired Mobility  Goal: Mobility/Activity is maintained at optimal level for patient  Outcome: Progressing     Problem: Altered GI Function  Goal: Nutritional intake is adequate  Outcome: Progressing  Goal: Elimination patterns are normal or improving  Outcome: Progressing  Goal: Mobility/Activity is maintained at optimal level for patient  Outcome: Progressing  Goal: Fluid and electrolyte balance are achieved/maintained  Outcome: Progressing  Goal: No bleeding  Outcome: Progressing

## 2021-07-31 LAB — BASIC METABOLIC PANEL
Anion Gap: 13 (ref 5.0–15.0)
Anion Gap: 13 (ref 5.0–15.0)
BUN: 12 mg/dL (ref 7.0–21.0)
BUN: 12 mg/dL (ref 7.0–21.0)
CO2: 22 mEq/L (ref 17–29)
CO2: 23 mEq/L (ref 17–29)
Calcium: 9.5 mg/dL (ref 8.5–10.5)
Calcium: 9.4 mg/dL (ref 8.5–10.5)
Chloride: 103 mEq/L (ref 99–111)
Chloride: 102 mEq/L (ref 99–111)
Creatinine: 1 mg/dL (ref 0.4–1.0)
Creatinine: 1.1 mg/dL — ABNORMAL HIGH (ref 0.4–1.0)
Glucose: 195 mg/dL — ABNORMAL HIGH (ref 70–100)
Glucose: 186 mg/dL — ABNORMAL HIGH (ref 70–100)
Potassium: 3.8 mEq/L (ref 3.5–5.3)
Potassium: 3.7 mEq/L (ref 3.5–5.3)
Sodium: 138 mEq/L (ref 135–145)
Sodium: 138 mEq/L (ref 135–145)
eGFR: >60 mL/min/{1.73_m2} (ref >=60)
eGFR: >60 mL/min/{1.73_m2} (ref >=60)

## 2021-07-31 LAB — HIGH SENSITIVITY TROPONIN-I WITH DELTA: hs Troponin-I: 5.5 ng/L

## 2021-07-31 LAB — LIPID PANEL
Cholesterol / HDL Ratio: 4.3 Index
Cholesterol: 104 mg/dL (ref 0–199)
HDL: 24 mg/dL — ABNORMAL LOW (ref 40–9999)
LDL Calculated: 37 mg/dL (ref 0–99)
Triglycerides: 215 mg/dL — ABNORMAL HIGH (ref 34–149)
VLDL Calculated: 43 mg/dL — ABNORMAL HIGH (ref 10–40)

## 2021-07-31 LAB — CBC
Absolute NRBC: 0 10*3/uL (ref 0.00–0.00)
Absolute NRBC: 0 10*3/uL (ref 0.00–0.00)
Hematocrit: 45.6 % — ABNORMAL HIGH (ref 34.7–43.7)
Hematocrit: 46 % — ABNORMAL HIGH (ref 34.7–43.7)
Hgb: 14.5 g/dL (ref 11.4–14.8)
Hgb: 14.6 g/dL (ref 11.4–14.8)
MCH: 29.7 pg (ref 25.1–33.5)
MCH: 29.8 pg (ref 25.1–33.5)
MCHC: 31.8 g/dL (ref 31.5–35.8)
MCHC: 31.7 g/dL (ref 31.5–35.8)
MCV: 93.4 fL (ref 78.0–96.0)
MCV: 93.9 fL (ref 78.0–96.0)
MPV: 13.4 fL — ABNORMAL HIGH (ref 8.9–12.5)
MPV: 13.8 fL — ABNORMAL HIGH (ref 8.9–12.5)
Nucleated RBC: 0 /100 WBC (ref 0.0–0.0)
Nucleated RBC: 0 /100 WBC (ref 0.0–0.0)
Platelets: 217 10*3/uL (ref 142–346)
Platelets: 207 10*3/uL (ref 142–346)
RBC: 4.88 10*6/uL (ref 3.90–5.10)
RBC: 4.9 10*6/uL (ref 3.90–5.10)
RDW: 13 % (ref 11–15)
RDW: 13 % (ref 11–15)
WBC: 11.89 10*3/uL — ABNORMAL HIGH (ref 3.10–9.50)
WBC: 12.14 10*3/uL — ABNORMAL HIGH (ref 3.10–9.50)

## 2021-07-31 LAB — HEMOGLOBIN A1C
Average Estimated Glucose: 114 mg/dL
Hemoglobin A1C: 5.6 % (ref 4.6–5.6)

## 2021-07-31 MED ORDER — LISINOPRIL 40 MG PO TABS
40.0000 mg | ORAL_TABLET | Freq: Every day | ORAL | 2 refills | Status: DC
Start: 2021-08-01 — End: 2022-07-01

## 2021-07-31 MED ORDER — PREDNISONE 20 MG PO TABS
60.0000 mg | ORAL_TABLET | Freq: Every morning | ORAL | 0 refills | Status: AC
Start: 2021-08-01 — End: 2021-08-03

## 2021-07-31 MED ORDER — MAGNESIUM OXIDE 400 MG TABS (WRAP)
400.0000 mg | ORAL_TABLET | Freq: Two times a day (BID) | ORAL | 0 refills | Status: DC
Start: 2021-07-31 — End: 2022-07-01

## 2021-07-31 NOTE — Plan of Care (Addendum)
NURSING SHIFT NOTE     Patient: [name] Cook  Day: 1      SHIFT EVENTS     Shift Narrative/Significant Events (PRN med administration, fall, RRT, etc.):   Miss [name] would not take prn reglan when it was offered with benadryl combo for headache.     Miss [name] has multiple complains of headache, nausea, vomiting. Pt. Received prn and scheduled analgesics, and anti-nausea medications, She is AOX4, VSS, BP was running high since yesterday, finally came under control at midnight, able to walk to bathroom voiding adequate, neuro assessment questionable due to her manipulation, will continue monitor.      Safety and fall precautions remain in place. Purposeful rounding completed.          ASSESSMENT     Changes in assessment from patient's baseline this shift:    Neuro: No  CV: No  Pulm: No  Peripheral Vascular: No  HEENT: No  GI: No  BM during shift: No, Last BM: Last BM Date: 07/29/21  GU: No   Integ: No  MS: No    Pain: None  Mobility: PMP Activity: Step 6 - Walks in Room of Distance Walked (ft) (Step 6,7): 0 Feet           Lines     Patient Lines/Drains/Airways Status       Active Lines, Drains and Airways       Name Placement date Placement time Site Days    Peripheral IV 07/29/21 20 G Anterior;Proximal;Right Forearm 07/29/21  1626  Forearm  1                         VITAL SIGNS     Vitals:    07/31/21 0006   BP: 134/89   Pulse: 95   Resp: 19   Temp: 98.2 F (36.8 C)   SpO2: 94%       Temp  Min: 97.7 F (36.5 C)  Max: 98.2 F (36.8 C)  Pulse  Min: 74  Max: 103  Resp  Min: 18  Max: 20  BP  Min: 134/89  Max: 200/115  SpO2  Min: 94 %  Max: 97 %      Intake/Output Summary (Last 24 hours) at 07/31/2021 0054  Last data filed at 07/30/2021 0533  Gross per 24 hour   Intake 360 ml   Output 300 ml   Net 60 ml        Amount achieved on incentive spirometer?: No data recorded      CARE PLAN        Problem: Moderate/High Fall Risk Score >5  Goal: Patient will remain free of falls  Outcome: Progressing     Problem: Every Day  - Stroke  Goal: Core/Quality measure requirements - Daily  Outcome: Progressing  Goal: Neurological status is stable or improving  Outcome: Progressing  Goal: Stable vital signs and fluid balance  Outcome: Progressing  Goal: Patient will maintain adequate oxygenation  Outcome: Progressing  Goal: Patient's risk of aspiration will be minimized  Outcome: Progressing  Goal: Nutritional intake is adequate  Outcome: Progressing  Goal: Elimination patterns are normal or improving  Outcome: Progressing  Goal: Mobility/Activity is maintained at optimal level for patient  Outcome: Progressing  Goal: Skin integrity is maintained or improved  Outcome: Progressing  Goal: Neurovascular status is stable or improving  Outcome: Progressing     Problem: Hemodynamic Status: Cardiac  Goal: Stable vital signs and fluid balance  Outcome: Progressing     Problem: Inadequate Tissue Perfusion  Goal: Adequate tissue perfusion will be maintained  Outcome: Progressing     Problem: Ineffective Gas Exchange  Goal: Effective breathing pattern  Outcome: Progressing     Problem: Nutrition  Goal: Nutritional intake is adequate  Outcome: Progressing     Problem: Impaired Mobility  Goal: Mobility/Activity is maintained at optimal level for patient  Outcome: Progressing     Problem: Altered GI Function  Goal: Nutritional intake is adequate  Outcome: Progressing  Goal: Elimination patterns are normal or improving  Outcome: Progressing  Goal: Mobility/Activity is maintained at optimal level for patient  Outcome: Progressing

## 2021-07-31 NOTE — Progress Notes (Signed)
Patient discharge home today, w/HH Needs. CM made refers to [facility] for Needs set up. Patient and family in agreeable with discharge plan and orders. Transportation will be provided by patient family. Patient was informed with regards to DMEs (shower chair ) which is not cover by patient insurance as well, patient was encourage to purchase from [facility] or [facility] upon discharge.      07/31/21 1614   Discharge Disposition   Patient preference/choice provided? Yes   Physical Discharge Disposition Home, Home Health   Mode of Transportation Car   Patient/Family/POA notified of transfer plan Patient informed only   Patient agreeable to discharge plan/expected d/c date? Yes   Bedside nurse notified of transport plan? Yes   CM Interventions   Follow up appointment scheduled? No   Reason no follow up scheduled? Family to schedule   Notified MD? Yes   Referral made for home health RN visit? Does not meet home bound criteria   Multidisciplinary rounds/family meeting before d/c? Yes   Medicare Checklist   Is this a Medicare patient? No

## 2021-07-31 NOTE — Discharge Instr - AVS First Page (Addendum)
Reason for your Hospital Admission:  Headache       Instructions for after your discharge:    Follow-up with headache neurologist as an outpatient  Continue taking prednisone as prescribed for 4 days and then discontinue  Work up with MRI brain is negative for acute issues  Need outpatient follow up with Psychiatric team for anxiety management

## 2021-07-31 NOTE — PT Eval Note (Signed)
[facility]  Physical Therapy Attempt Note    Patient:  [name] Cook MRN#:  [id number]  Unit:  [facility] Room/Bed:  A2505/A2505-B      Physical Therapy treatment attempted on 07/31/2021 at 2:05 PM unable to complete secondary to patient refusing to participate.    Physical and/or occupational therapy will follow up at a later time/date as able.     RN aware.    Madelon Lips, PT, DPT   Physical Therapist  License # [id number]  [phone]

## 2021-07-31 NOTE — Discharge Summary (Signed)
[facility]   HOSPITALIST Discharge Summary      Patient: [name] Cook  Admission Date: 07/29/2021   DOB: October 11, 1975  Discharge Date: 07/31/2021    MRN: [id number]  Discharge Attending: Kari Baars MD   Referring Physician: Letitia Libra, MD  PCP: Letitia Libra, MD       DISCHARGE SUMMARY     Discharge Information   Admission Diagnosis:   Right-sided paresthesia    Discharge Diagnosis:   Patient Active Problem List    Diagnosis Date Noted    UTI (urinary tract infection) 03/12/2021    Acute cystitis 03/12/2021    Right sided weakness 09/15/2020    COVID-19 03/13/2020    HTN (hypertension) 03/13/2020    Wound dehiscence 10/17/2019    Hypertensive urgency 10/17/2019    History of seizure disorder 10/17/2019    Cellulitis of other specified site 10/15/2019    Weakness 10/29/2018    Abdominal pain 09/05/2018    Headache 03/05/2018    Nonspecific abnormal electroencephalogram (EEG) 03/04/2018    Right hemiparesis 09/15/2017    Paresthesia of right arm and leg 04/17/2017    Hemorrhoids 04/17/2017    HLD (hyperlipidemia) 04/16/2017    Hematemesis 04/15/2017    Blood loss anemia 04/15/2017    Tension type headache 05/13/2016    History of stroke 05/13/2016    Morbid obesity with BMI of 45.0-49.9, adult 05/13/2016    Facial droop 05/11/2016    Seizures 08/20/2015    Stroke-like symptoms 08/19/2015    Chest pain 06/17/2015        Discharge Medications:     Medication List        START taking these medications      magnesium oxide 400 MG tablet  Commonly known as: MAG-OX  Take 1 tablet (400 mg) by mouth 2 (two) times daily     predniSONE 20 MG tablet  Commonly known as: DELTASONE  Take 3 tablets (60 mg) by mouth every morning with breakfast for 2 days  Start taking on: Aug 01, 2021            CONTINUE taking these medications      albuterol sulfate HFA 108 (90 Base) MCG/ACT inhaler  Commonly known as: PROVENTIL  Inhale 2 puffs into the lungs every 4 (four) hours as needed for Wheezing or Shortness of  Breath     amLODIPine 5 MG tablet  Commonly known as: NORVASC  Take 1 tablet (5 mg total) by mouth daily     aspirin 81 MG chewable tablet  Chew 1 tablet (81 mg total) by mouth daily     atorvastatin 40 MG tablet  Commonly known as: LIPITOR  Take 1 tablet (40 mg total) by mouth daily     KEPPRA PO     lisinopril 40 MG tablet  Commonly known as: ZESTRIL  Take 1 tablet (40 mg) by mouth daily  Start taking on: Aug 01, 2021     pantoprazole 40 MG tablet  Commonly known as: PROTONIX  Take 1 tablet (40 mg total) by mouth daily               Where to Get Your Medications        These [id number] [place] - [address]  [address], [place] [address]      Phone: [phone]   lisinopril 40 MG tablet  magnesium oxide 400 MG tablet  predniSONE 20 MG  tablet             Hospital Course   Presentation History   Per admitting H&P " [name] Cook is a 46 y.o. female with a PMH of CVA, seizures, hypertension, hyperlipidemia presenting to the ED for facial droop.  Reports right-sided facial droop noted by her husband yesterday at about 5 PM.  Also reports right arm and right leg weakness, numbness and tingling.  At that time she also developed chest pain and shortness of breath which has been intermittent since yesterday.  She presented to [facility] yesterday, CT head showed no acute findings.  Due to lack of beds, she was transferred to [facility] for further evaluation.  States she took her medications this morning.  She has a history of severe iodine contrast allergy.  Reports recent stress due to her niece passing away on Sunday.   Denies syncope, fever, chills, leg swelling, abdominal pain, nausea, vomiting, diarrhea, dysuria. "     See HPI for details.    Hospital Course   Following admission, patient had MRI of the brain as well as MRA of head and neck came back negative for any acute issues, patient got seen by neurology team who thinks this may be  complicated migraine and started on magnesium and prednisone, patient continued to complain of headaches and requesting Dilaudid IV, patient has multiple admissions in the past for similar symptoms, neurology team recommended outpatient psychiatric consultation.     Patient was complaining of headaches, due to limited use of pain medications from her " allergies" patient was given magnesium oxide, prednisone, patient still complaining of headache, no neurological awake deficit noted, she has other medical conditions including morbid obesity with BMI of 46 for which she is advised to follow-up with nutritionist, blood pressure is noted to be elevated, the blood pressure medications were given her blood pressure stabilized, also continued on Keppra for history of seizure, she remained seizure-free in the hospital.     Today patient is getting discharged in stable condition, she will follow-up as an outpatient with headache clinic.       Procedures:   MRI brain without contrast   Final Result    No acute findings or substantial change      Nicki Reaper, MD   07/30/2021 4:17 AM      MR Angiogram Head WO Contrast   Final Result       No focal vascular abnormalities are identified in the Circle of Willis region based on this MRA study.      Nicki Reaper, MD   07/30/2021 4:20 AM      MR Angiogram Neck WO Contrast   Final Result       No evidence for hemodynamically significant stenosis in bilateral carotid and vertebral arteries      Nicki Reaper, MD   07/30/2021 4:35 AM      US Venous Low Extrem Duplx Dopp Comp Bilat   Final Result      No evidence of deep venous thrombosis      Nicki Reaper, MD   07/29/2021 11:53 PM      NM Spect CT Fusion Sgl Area Sgl Day   Final Result      Low probability of pulmonary embolism.      Nelya Ebadirad   07/29/2021 7:46 PM      NM Lung Scan Perfusion Particulate   Final Result      Low probability of pulmonary embolism.  Nelya Ebadirad   07/29/2021 7:46 PM      Chest AP Portable   Final Result       Linear opacities at the right lung base may represent subsegmental atelectasis, however, developing pneumonia cannot be excluded.      [name], MD   07/29/2021 3:59 PM          Consultants  Treatment Team: Attending Provider: Viviann Spare, MD; Consulting Physician: Ramond Marrow, DO; Technician: Sondra Come; Registered Nurse: Deeann Dowse, RN; Utilization Review: Homero Fellers, RN      Best Practices   Was the patient admitted with either a CHF Exacerbation or Pneumonia? No     Progress Note/Physical Exam at Discharge     Subjective:  Headaches    Vitals:    07/31/21 0006 07/31/21 0349 07/31/21 0803 07/31/21 1150   BP: 134/89 139/84 128/81 134/78   Pulse: 95 (!) 105 96 74   Resp: 19 17 18 18    Temp: 98.2 F (36.8 C) 98.6 F (37 C) 97.9 F (36.6 C) 97.9 F (36.6 C)   TempSrc: Oral Oral Oral Oral   SpO2: 94% 95% 94% 92%   Weight:   146.7 kg (323 lb 8 oz)    Height:           General: NAD, AAOx3  HEENT: perrla, eomi, sclera anicteric, OP: Clear, MMM  Neck: supple, FROM, no LAD  Cardiovascular: RRR, no m/r/g  Lungs: CTAB, no w/r/r  Abdomen: soft, +BS, NT/ND, no masses, no g/r  Extremities: no C/C/E  Neuro: No weakness, no sensory loss, cranial nerves are intact  Skin: no rashes or lesions noted    Admission Condition: stable  Discharge Condition: good  Functional Status: Patient is independent with mobility/ambulation, transfers, ADL's, IADL's.     Diagnostics     Labs/Studies Pending at Discharge: No    Last Labs   Recent Labs   Lab 07/31/21  0410   WBC 11.89*   RBC 4.88   Hgb 14.5   Hematocrit 45.6*   MCV 93.4   Platelets 217       Recent Labs   Lab 07/31/21  0410 07/29/21  1627   Sodium 138  138 142   Potassium 3.7  3.8 3.6   Chloride 102  103 107   CO2 23  22 26    BUN 12.0  12.0 10.0   Creatinine 1.1*  1.0 0.9   Glucose 186*  195* 91   Calcium 9.4  9.5 9.2       Microbiology Results (last 15 days)       Procedure Component Value Units Date/Time    COVID-19 (SARS-CoV-2) and  Influenza A/B, NAA (Liat Rapid)- Admission [[id number]] Collected: 07/29/21 1627    Order Status: Completed Specimen: Culturette from Nasopharyngeal Updated: 07/29/21 1658     Purpose of COVID testing Diagnostic -PUI     SARS-CoV-2 Specimen Source Nasal Swab     SARS CoV 2 Overall Result Not Detected     Comment: __________________________________________________  -A result of "Detected" indicates POSITIVE for the    presence of SARS CoV-2 RNA  -A result of "Not Detected" indicates NEGATIVE for the    presence of SARS CoV-2 RNA  __________________________________________________________  Test performed using the Roche cobas Liat SARS-CoV-2 assay. This assay is  only for use under the Food and Drug Administration's Emergency Use  Authorization. This is a real-time RT-PCR assay for the qualitative  detection of SARS-CoV-2 RNA. Viral nucleic  acids may persist in vivo,  independent of viability. Detection of viral nucleic acid does not imply the  presence of infectious virus, or that virus nucleic acid is the cause of  clinical symptoms. Negative results do not preclude SARS-CoV-2 infection and  should not be used as the sole basis for diagnosis, treatment or other  patient management decisions. Negative results must be combined with  clinical observations, patient history, and/or epidemiological information.  Invalid results may be due to inhibiting substances in the specimen and  recollection should occur. Please see Fact Sheets for patients and providers  located:  WirelessDSLBlog.no          Influenza A Not Detected     Influenza B Not Detected     Comment: Test performed using the Roche cobas Liat SARS-CoV-2 & Influenza A/B assay.  This assay is only for use under the Food and Drug Administration's  Emergency Use Authorization. This is a multiplex real-time RT-PCR assay  intended for the simultaneous in vitro qualitative detection and  differentiation of SARS-CoV-2, influenza A, and  influenza B virus RNA. Viral  nucleic acids may persist in vivo, independent of viability. Detection of  viral nucleic acid does not imply the presence of infectious virus, or that  virus nucleic acid is the cause of clinical symptoms. Negative results do  not preclude SARS-CoV-2, influenza A, and/or influenza B infection and  should not be used as the sole basis for diagnosis, treatment or other  patient management decisions. Negative results must be combined with  clinical observations, patient history, and/or epidemiological information.  Invalid results may be due to inhibiting substances in the specimen and  recollection should occur. Please see Fact Sheets for patients and providers  located: http://www.rice.biz/.         Narrative:      o Collect and clearly label specimen type:  o PREFERRED-Upper respiratory specimen: One Nasal Swab in  Transport Media.  o Hand deliver to laboratory ASAP  Diagnostic -PUI             Patient Instructions   Discharge Diet: cardiac diet  Discharge Activity:  activity as tolerated  Discharge instructions:  Discharge Code Status: Full code  Additional instructions -follow-up with headache clinic as an outpatient  Disposition:  Home    Follow Up Appointment:   Follow-up Information       Bertram Denver, MD. Schedule an appointment as soon as possible for a visit in 1 week(s).    Specialty: Neurology  Contact information:  [address] [place]  [address]  [place] [address]  [phone]               Czapp, Gilford Rile, MD .    Specialty: [facility] Medicine  Contact information:  [address]  [address]  [place] [address]  [phone]                           See Czapp, Gilford Rile, MD in 1-2 weeks.  See Dr. Charna Busman , neurology, in 4-6 weeks.           The review of the patient's medications does not in any way constitute an endorsement, by this clinician,  of their use, dosage, indications, route, efficacy, interactions, or other clinical parameters.      This note was generated within the [employer] EMR using Dragon medical speech recognition software and may contain inherent errors or omissions not intended by the  user. Grammatical and punctuation errors, random word insertions, deletions, pronoun errors and incomplete sentences are occasional consequences of this technology due to software limitations. Not all errors are caught or corrected.  Although every attempt is made to root out erroneus and incomplete transcription, the note may still not fully represent the intent or opinion of the author. If there are questions or concerns about the content of this note or information contained within the body of this dictation they should be addressed directly with the author for clarification.    Time spent examining patient, discussing with patient/family regarding hospital course face to face, chart review, reconciling medications and discharge planning: 20 minutes.    Signed,  Kari Baars, MD  2:28 PM 07/31/2021     CC to: Czapp, Gilford Rile, MD

## 2021-07-31 NOTE — Plan of Care (Signed)
Discharge order verified, pt ID verified    Pt cleared for discharge. RN provided discharge instructions along with information regarding medication regimen, and f/u appointments to the pt and significant other at the bedside. Patient was informed need outpatient follow up with psychiatrist for anxiety management and patient was not happy about it. Prescriptions sent to pt's pharmacy on file. PIV removed. Telemetry removed, cleaned, and returned to unit supply. Telemetry communication order discontinued per protocol. Patient refused wheelchair verbalized will visit uncle who's a patient in the other unit.     NAD noted, no complaints voiced.      VSSBP 133/80   Pulse 100   Temp 97.9 F (36.6 C) (Oral)   Resp 18   Ht 1.778 m (5\' 10" )   Wt 146.7 kg (323 lb 8 oz)   LMP 10/15/2018 (Exact Date)   SpO2 90%   BMI 46.42 kg/m

## 2021-08-01 NOTE — Progress Notes (Addendum)
Start [facility] Note  Home Health Referral    Referral from Comfort Lydgate, Case Manager, for home health care upon discharge.    By [employer], the patient has the right to freely choose a home care provider.    A company of the patients choosing. We have supplied the patient with a listing of providers in your area who asked to be included and participate in Medicare.   [facility], formerly [facility], a home care agency that provides adult home care services and participates in Medicare   The preferred provider of your insurance company. Choosing a home care provider other than your insurance company's preferred provider may affect your insurance coverage.      Home Health Discharge Information    Your doctor has ordered Physical Therapy and Occupational Therapy in-home service(s) for you while you recuperate at home, to assist you in the transition from hospital to home.    The agency that you or your representative chose to provide the service:  Name of Home Health Agency Placement: Other (comment box) [facility] [phone]))]    The above services were set up by:  Delrae Alfred, Home Health Liaison  Phone: [phone]    IF YOU HAVE NOT HEARD FROM YOUR HOME YOUR HOME HEALTH AGENCY WITHIN 24-48 HOURS AFTER DISCHARGE PLEASE CALL YOUR AGENCY TO ARRANGE A TIME FOR YOUR FIRST VISIT. FOR ANY SCHEDULING CONCERNS OR QUESTIONS RELATED TO HOME HEALTH, SUCH AS TIME OR DATE PLEASE CONTACT YOUR HOME HEALTH AGENCY AT THE NUMBER LISTED ABOVE.    Additional Comments:  Spoke with the patient by phone to discuss [facility] services. - she was agreeable to [facility] services, preferably with [facility].       START PATIENT REGISTRATION INFORMATION     Order Information  Service Ordered RN ?: No    Service Ordered PT ?: Yes  Service Ordered OT ?: Yes  Service Ordered ST ?: No    Service Ordered MSW?: No    Service Ordered HHA?: No    Following Physician: Letitia Libra, MD  Following Physician Phone:  [phone]    Care Coordination   Primary Care Physician:Patricia A Czapp, MD  Primary Care Physician Phone:None    Demographics  Patient Last Name: Oneal Grout   Patient First Name: [name] Cook  Language/Communication Barrier: no  Service Address: [address]   [place] [address]   Service Home Phone: [phone] (home)   Other phone numbers:    Telephone Information:   Mobile [phone]     Emergency Contact: Extended Emergency Contact Information  Primary Emergency Contact: Susa Day [place] Phone: [phone]  Relation: Significant Other  Primary Phone Number:  [phone]  Secondary Phone Number:  [phone]    Admission Information  Admit Date: 07/29/2021  Patient Status at discharge:  Observation  Admitting Diagnosis: Facial droop [R29.810]     HITECH  NO      END PATIENT REGISTRATION INFORMATION       Diagnoses:   HISTORY AND PHYSICAL      [name] Cook is a 46 y.o. female with a PMH of CVA, seizures, hypertension, hyperlipidemia presenting to the ED for facial droop.  Reports right-sided facial droop noted by her husband yesterday at about 5 PM.  Also reports right arm and right leg weakness, numbness and tingling.  At that time she also developed chest pain and shortness of breath which has been intermittent since  yesterday.  She presented to [facility] yesterday, CT head showed no acute findings.  Due to lack of beds, she was transferred to [facility] for further evaluation.  States she took her medications this morning.  She has a history of severe iodine contrast allergy.  Reports recent stress due to her niece passing away on Sunday.   Denies syncope, fever, chills, leg swelling, abdominal pain, nausea, vomiting, diarrhea, dysuria.     Past Medical History:  Medical History[]Expand by Default        Past Medical History:   Diagnosis Date    Hypercholesteremia      Hypertension      Seizures      Stroke       20 15 and 2017, right side    TIA  (transient ischemic attack) 2017          Start [facility] Summary      COVID-19 Screening:  COVID-19   Negative                                                                             If positive or result pending, is patient agreeable to wearing PPE during visit? N/A    Additional Comments: N/A    End [facility] Summary     Discharge Date:  07/31/2021    Referral Source  Signed by: Delrae Alfred  Date Time: 08/01/21 2:26 PM      End [facility] Note      Home Health face-to-face (FTF) Encounter (Order [id number])  Consult  Date: 08/01/2021 Department: [facility] Ordering/Authorizing: Viviann Spare, MD     Order Information    Order Date/Time Release Date/Time Start Date/Time End Date/Time   08/01/21 09:59 AM None 08/01/21 09:52 AM 08/01/21 09:52 AM     Order Details    Frequency Duration Priority Order Class   Once 1  occurrence Routine Hospital Performed     Standing Order Information    Remaining Occurrences Interval Last Released     0/1 Once 08/01/2021              Provider Information    Ordering User Ordering Provider Authorizing Provider   Fletcher, Sylvie Farrier, MD Viviann Spare, MD   Attending Provider(s) Admitting Provider PCP   Maryella Shivers, MD; Dallie Piles, MD; Viviann Spare, MD Dallie Piles, MD Czapp, Gilford Rile, MD     Verbal Order Info    Action Created on Order Mode Entered by Responsible Provider Signed by Signed on   Ordering 08/01/21 0959 Telephone with Melvyn Neth, Sylvie Farrier, MD             Comments    Home PT/OT required for gait and balance training, strengthening, mobility, fall prevention, and ADL training.     Physician to Follow:  Gilford Rile. Czapp, MD     HISTORY AND PHYSICAL:   [name] Cook is a 46 y.o. female with a PMH of CVA, seizures, hypertension, hyperlipidemia presenting to the ED for facial droop.   Reports right-sided facial droop noted by her husband yesterday at about 5 PM.  Also reports right arm and right leg  weakness, numbness  and tingling.   At that time she also developed chest pain and shortness of breath which has been intermittent since yesterday.   She presented to [facility] yesterday, CT head showed no acute findings.  Due to lack of beds, she was transferred to [facility] for further evaluation.   States she took her medications this morning.  She has a history of severe iodine contrast allergy.  Reports recent stress due to her niece passing away on Sunday.   Denies syncope, fever, chills, leg swelling, abdominal pain, nausea, vomiting, diarrhea, dysuria.       Past Medical History:   Medical HistoryExpand by Default   Past Medical History:   Diagnosis Date    Hypercholesteremia      Hypertension      Seizures      Stroke       20 15 and 2017, right side    TIA (transient ischemic attack) 2017              Home Health face-to-face (FTF) Encounter: Patient Communication    Not Released Not seen         Order Questions    Question Answer   Date I saw the patient face-to-face: 08/01/2021   Evidence this patient is homebound because: N.  Impaired mobility d/t pain, arthritis, weakness that compromises patient safety    M.  Unsteady gait, poor ambulation with history of falls    C.  Decreased endurance, strength, ROM, cadence, safety/judgment during mobility    E.  Requires assistance of another person or assistive device to ambulate > 5 feet    F.  Deconditioned due to advance disease process requiring assistance to leave home    G.  Fall risk due to impaired coordination, gait and decreased balance    K.  Chronic condition that leads to high fatigue & SOB with ambulation > 5 feet   Medical conditions that necessitate Home Health care: B.  Functional impairment due to recent hospitalization/procedure/treatment    C.  Risk for complication/infection/pain requiring follow up and monitoring    D.  Chronic illness & risk for re-hospitalization due to unstable disease status    E.  Exacerbation of disease requiring follow up  monitoring    F.  New diagnosis & treatment requiring follow up monitoring and management   Per clinical findings, following services are medically necessary: PT    OT   Clinical findings that support the need for Physical Therapy. PT will I.  Instruct on restorative activities to restore ability to perform ADL    H.  Educate on the safe use of assistive device/ durable medical equipment    F.  Perform home safety assessment & develop safe in home exercise program    E.  Educate on functional mobility; bed, chair, sit, stand and transfer activities    D.  Provide services to help restore function, mobility, and releive pain    A.  Evaluate and treat functional impairment and improve mobility    C.  Educate on weight bearing status, stair/gait training, balance & coordination   OT will provide assistance with: Home program to improve ability to perform ADLs    Restorative program to improve mobility and independence   Other (please specify) see comments                    Process Instructions    Please select Home Care Services medically necessary.  Based on the above findings, I certify that this patient is confined to the home and needs intermittent skilled nursing care, physical therapry and / or speech therapy or continues to need occupational therapy. The patient is under my care, and I have initiated the establishment of the plan of care. This patient will be followed by a physician who will periodically review the plan of care.      Collection Information            Consult Order Info    ID Description Priority Start Date Start Time   [id number] Home Health face-to-face (FTF) Encounter Routine 08/01/2021  9:52 AM   Provider Specialty Referred to   ______________________________________ _____________________________________                         Verbal Order Info    Action Created on Order Mode Entered by Responsible Provider Signed by Signed on   Ordering 08/01/21 0959 Telephone with Melvyn Neth, Sylvie Farrier, MD             Patient Information    Patient Name   [name] Cook Legal Sex   Female DOB   Aug 10, 1975       Reprint Order Requisition    Home Health face-to-face (FTF) Encounter (Order [id number]) on 08/01/21       Additional Information    Associated Reports External References   Priority and Order Details [employer]      [facility]        Patient Name: [name] Cook, [name] Cook     MRN: [id number]      CSN: [id number]         Account Information     Hosp Acct #   [id number] Patient Class   Observation Service  Medicine Accommodation Code  Intermediate Care      Admission Information     Admitting Physician:  Attending Physician: Dallie Piles, MD    Unit  [facility]* L&D Status      Admitting Diagnosis: Facial droop Room / Bed  A2505/A2505-B L&D - Last Menstrual Cycle  10/15/2018   Chief Complaint: Facial Droop; Chest Pain     Admit Type:  Admit Date/Time:  Discharge Date/Time: Emergency  07/29/2021 / 1405  07/31/2021 / 1643 Length of Stay: 0 Days    L&D EDD   Estimated Date of Delivery: None noted.      Patient Information              Home Address: [address]   [place] [address] Employer:  Employer Address:       ,     Main Phone: [phone] Employer Phone:     SSN: [id number]       DOB: 1975-11-25 (45 yrs)       Sex: Female Primary Care Physician: Letitia Libra, MD   Marital Status: Single Referring Physician:       No ref. provider found   Race: Black or African American       Ethnicity: Not of Hispanic/Latino/S*       Emergency Contacts  Name Home Phone Work Phone Mobile Phone Relationship Estanislado Spire     [phone] Significant*           Guarantor Information     Guarantor Name: WANDY, BOSSLER ID: [id number]   Guarantor Relationship to Pt: Self Guarantor Type: Personal/Family   Guarantor  DOB:    11-Jun-1975       Guarantor Address: [phone] [place] [address]          Guarantor Home Phone:  [phone] Guarantor Employer:        Guarantor Work Phone:   Pensions consultant Emp Phone:                     Editor, commissioning     Insurance Name: MEDICAID HMO-[employer]AN* Subscriber Name: [employer] Address:    [address]  [place], [place] [address] Subscriber DOB: [birth year]      Subscriber ID: [id number]   Insurance Phone: [phone] Pt Relationship to Sub:   Self   Insurance ID:         Group Name:   Preauthorization #: [id number]   Group #:   Preauthorization Days:        Tourist information centre manager Name: - Statistician Name:     Nurse, children's:       ,   Statistician DOB:        Subscriber ID:     Press photographer:   Pt Relationship to Sub:       Insurance ID:         Group Name:   Passenger transport manager #:     Group #:   Preauthorization Days:        [employer] Name: Scientist, clinical (histocompatibility and immunogenetics) Name:     Community education officer Address:       ,   Statistician DOB:        Subscriber ID:     Press photographer:   Pt Relationship to Sub:       Insurance ID:         Group Name:   Preauthorization #:     Group #:   Preauthorization Days:           08/01/2021 10:01 AM

## 2021-08-01 NOTE — Progress Notes (Signed)
This [facility] call and notified the patient that [facility] would be calling within the next 24-48 hours to schedule the appointment for the home health [facility] - she verbalized understanding and reported still having their contact information from her last admission to their service.    Everlena Cooper, RN BSN  Post Acute Care Coordinator  [phone]  [email or web address]

## 2021-11-30 ENCOUNTER — Inpatient Hospital Stay
Admission: AD | Admit: 2021-11-30 | Payer: Self-pay | Source: Other Acute Inpatient Hospital | Admitting: Internal Medicine

## 2022-01-02 NOTE — ED Provider Notes (Signed)
Called by ED to give recommendations for patient    [name] Cook is a 46 y.o. female.  has a past medical history of COVID-19 (02/2020), HTN (hypertension), Hyperlipidemia, Seizure, and Stroke.     Patient presented to ED with chief compliant of Facial Droop (R sided facial droop x 1 week. Unable to completely close R eye. Speech clear. Denies weakness. C/o chest, back pain since yesterday. H/o Seizure, HTN, High cholesterol, Stroke 2015)    Presenting to the hospital for right-sided facial droop weakness and nausea along with history of headaches.   Does not look like facial palsy as per ED.     MRI/MRA brain did not show any acute abnormality.     Patient's symptoms did improve after pain medication and headache cocktail.     Plan  Improving generalized weakness, headache and facial droop  Could have been due to complicated headache/migraine/stroke recrudescence versus less seizure  Does not seem like Bell's palsy as per ED, as only to lower the face was involved.  Can do a prednisone taper for possible complicated headache  MRI/MRA brain without acute findings.   Patient should continue taking her aspirin, statin and blood pressure medication for stroke prophylaxis  Continue taking Keppra 500mg  BID for seizure prophylaxis and will need to get outpatient EEG  Patient should continue following up with her outpatient neurologist.     Case discussed with primary team.

## 2022-01-02 NOTE — ED Provider Notes (Signed)
HISTORY OF PRESENT ILLNESS    [name] Cook is a 46 y.o. female with a history of CVA 2015, seizures, HTN, HLD, appendectomy, presenting with right facial droop for 1 week.  This is associated with bilateral calf cramping.  She said that her husband urged her to come in.  Her neurologist Dr. Cherly Hensen, she has not been able to eat for a long time because "I could not get a referral."  She is on Keppra Lipitor aspirin and antihypertensive meds.  She says this happens repeatedly and has been happening repeatedly over the last few years and she was last told by neurologist in outside hospital did not go to the emergency department when she has recurrent droop like this.  She was seen here by me actually 3 weeks ago with similar symptoms and had an MRI that was negative.    PCP: Letitia Libra, MD    REVIEW OF SYMPTOMS     Review of Systems   Constitutional: Negative.    Neurological:  Positive for focal weakness.   All other systems reviewed and are negative.      PAST HISTORY     Medical/Surgical History:   Past Medical History:   Diagnosis Date   . COVID-19 02/2020   . HTN (hypertension)    . Hyperlipidemia    . Seizure    . St[facility]     [facility]      Past Surgical History:   Procedure Laterality Date   . APPENDECTOMY       Social History:   Social History     Tobacco Use   . Smoking status: Never   . Smokeless tobacco: Never   Substance Use Topics   . Alcohol use: No   . Drug use: No      Family History:   History reviewed. No pertinent family history.  Medications:   The patient's home medications have been reviewed.   Allergies:   Contrast [iohexol], Morphine, Nsaids (non-steroidal anti-inflammatory drug), Percocet [oxycodone-acetaminophen], Toradol [ketorolac], Tramadol, and Fentanyl     PHYSICAL EXAM     Temp: 36.4 C (97.5 F)  Heart Rate: 72  Resp: 18  BP: 164/92  SpO2: 98 %  Pain Score: 7       Constitutional: Well appearing, no distress. Pt  alert and oriented to person place and time.  Head: Atraumatic. Normocephalic.  Eyes: Eyelids normal to inspection.  Extraocular movements intact. Anicteric. No nystagmus.  ENT:  Normal voice, no stridor. MMM.  Neck: Normal range of motion, supple. Normal inspection.   Respiratory Chest: Normal respiratory rate and effort. Clear to auscultation bilaterally.  Cardiovascular: Regular rate and rhythm.  Heart sounds normal.  Abdominal: Soft, non-tender, non-distended abdomen  Back: Normal inspection. Normal ROM.  Upper Extremities: Inspection normal. ROM normal. 2+ radial pulses equal.  Lower Extremities: Inspection normal.  No edema.  Skin: Warm and dry. Normal in color. No rashes  Neuro: Speech normal, memory normal, dense right-sided facial paralysis and droop with sparing of the forehead, no focal motor deficits upper or lower extremities, no focal sensory deficits upper or lower extremities  Psych: Normal affect.    MEDICAL DECISION MAKING     Results:   Results for orders placed or performed during the hospital encounter of 01/02/22   Comprehensive metabolic panel   Result Value Ref Range    Sodium 144 (H) 132 - 143 mmol/L    Potassium 4.3  3.6 - 5.0 mmol/L    Chloride 108 (H) 98 - 107 mmol/L    Carbon Dioxide 24 23 - 27 mmol/L    Urea Nitrogen 13 7 - 20 mg/dL    Creatinine 1.61 0.5 - 1.6 mg/dL    Glucose 096 (H) 71 - 99 mg/dL    Calcium 9.4 8.4 - 04.5 mg/dL    Total Protein 7.7 5.9 - 8.1 g/dL    Albumin 4.1 3.5 - 5.0 g/dL    Bilirubin,Total 0.9 0.2 - 1.3 mg/dL    Alkaline Phosphatase 128 (H) 38 - 126 U/L    Aspartate Amino Trans 25 14 - 59 U/L    Alanine Amino Trans 26 9 - 72 U/L    Anion Gap 12 7 - 16 mmol/L    BUN / Creatinine Ratio 16     AST/ALT Ratio 1.0     eGFR -CKD-EPI Creatinine (2021) 94 >60 mL/min/1.73 sqm   HCG, Serum, Qualitative   Result Value Ref Range    HCG Serum Qual Negative Negative   CBC WITH DIFFERENTIAL   Result Value Ref Range    White Blood Cell Count 7.17 4.50 - 11.00 K/cu mm    Red Blood  Cell Count 4.34 4.00 - 5.20 M/cu mm    Hemoglobin 13.3 12.0 - 15.0 g/dL    Hematocrit 40.9 81.1 - 46.0 %    Mean Corpuscular Volume 95.4 80.0 - 100.0 fL    Mean Corpus Hgb 30.6 26.0 - 34.0 pg    Mean Corpus Hgb Conc 32.1 31.0 - 37.0 g/dL    RBC Distribution Width 13.1 11.5 - 14.5 %    Platelet Count 141 (L) 150 - 350 K/cu mm    Mean Platelet Volume 13.4 (H) 9.2 - 12.7 fL    Nucleated RBC Number 0.00 0.00 - 0.01 K/cu mm    Neutrophil % 62.1 40.0 - 70.0 %    Lymphocytes % 27.1 24.0 - 44.0 %    Monocyte % 8.1 2.0 - 11.0 %    Eosinophil % 2.0 1.0 - 4.0 %    Basophil % 0.4 0.0 - 2.0 %    Immature Gran % 0.3 0.0 - 1.0 %    ANC-Neutrophil Absolute 4.46 1.50 - 7.80 K/cu mm    Lymphocytes Absolute 1.94 1.10 - 4.80 K/cu mm    Monocyte Absolute 0.58 0.10 - 1.20 K/cu mm    Eosinophil Absolute 0.14 0.12 - 0.30 K/cu mm    Immature Granulocytes Abs 0.02 0.00 - 0.05 K/cu mm   hs-Trop-T 0 hr   Result Value Ref Range    hs-Trop-T 0 hr 7 <=14 ng/L   ECG 12 Lead   Result Value Ref Range    Ventricular Rate 65 BPM    Atrial Rate 65 BPM    P-R Interval 122 ms    QRS Duration 88 ms    Q-T Interval 456 ms    QTC Calculation(Bezet) 474 ms    P Axis 18 degrees    R Axis 34 degrees    T Axis 19 degrees    Diagnosis Line       Normal sinus rhythm  Normal ECG  When compared with ECG of 04-Dec-2021 16:40,  Vent. rate has decreased BY  41 BPM  RAD no longer  present  Nonspecific T wave abnormality, improved in Lateral leads  Confirmed by Salvadore Dom [phone]) on 01/02/2022 5:16:56 PM         Imaging Results: MRI Stroke WO Contrast  and MRA    Result Date: 01/02/2022  IMPRESSION: 1. No acute infarct. 2. No intracranial vessel high-grade stenosis or occlusion. Images and interpretation personally reviewed by: Meredith Mody, MD    US/VAS DUPLEX LOWER EXTREMITIES VENOUS BILATERAL    Result Date: 01/02/2022  IMPRESSION: No DVT in the bilateral lower extremity veins.   Images and interpretation personally reviewed by: Ronald Lobo, MD    XR Chest AP  Only    Result Date: 01/02/2022  IMPRESSION: No acute abnormality Images and interpretation personally reviewed by: Terance Hart, MD     Pulse Oximetry Interpretation: Normal 98 % room air      EKG Interpretation:  Signed and interpreted by the EP.    NSR 65, no stemi. Normal ekg    Nursing Notes: Reviewed available nursing notes.       Medical Records Reviewed: Nursing notes.      MDM: 46 y.o. female with history of stroke almost 8 years ago with right-sided weakness, on Keppra for seizures, on a statin and aspirin, unclear if she is compliant with multiple ED visits and hospitalizations for TIA/right-sided weakness including right-sided facial droop presenting with 1 week of right-sided facial droop.  Out of the window for TNK or thrombolytics.  Protecting her airway awake alert oriented.  Slightly hypertensive.  No distress.  Discussed with neurology is recommending MRI.  Dispo pending.     MRI negative.  Discussed with neurology again recommending maybe a course of steroids for potential Bell's palsy and stressed the importance of follow-up with neuro which I have also relayed to her.  Upon reevaluation for discharge and updating her on her results she has almost no facial droop.  She was sleeping.  Stable vitals.  No distress.  Stable for discharge.         Orders Placed This Encounter   Medications   . lactated ringers bolus 1,000 mL   . morphine 2 mg/mL injection 2 mg   . diphenhydrAMINE (BENADRYL) 50 mg/mL injection 25 mg   . LORazepam (ATIVAN) injection 0.5 mg   . predniSONE (DELTASONE) 20 MG tablet     Sig: Take 3 tablets (60 mg total) by mouth daily for 1 day, THEN 2.5 tablets (50 mg total) daily for 1 day, THEN 2 tablets (40 mg total) daily for 1 day, THEN 1.5 tablets (30 mg total) daily for 1 day, THEN 1 tablet (20 mg total) daily for 1 day, THEN 0.5 tablets (10 mg total) daily for 1 day. Taper as directed.     Dispense:  11 tablet     Refill:  0       Medical Decision Making  Amount and/or  Complexity of Data Reviewed  External Data Reviewed: labs, radiology and notes.     Details: Care Everywhere, epic, CRISP  Labs: ordered. Decision-making details documented in ED Course.  Radiology: ordered and independent interpretation performed. Decision-making details documented in ED Course.  ECG/medicine tests: ordered and independent interpretation performed. Decision-making details documented in ED Course.  Discussion of management or test interpretation with external provider(s): Dr. Kennith Gain, neuro    Risk  OTC drugs.  Prescription drug management.        IMPRESSION AND DISPOSITION     Clinical Impression   1. Facial droop               Loma Newton, [place]  01/02/22 1904

## 2022-02-17 NOTE — ED Provider Notes (Signed)
Chief Complaint   Patient presents with   . Facial Droop     Awoke at 0600 with R sided facial droop, dizziness, got up to use bathroom and had LOC for unknown time, ambulating with steady gait, also cx of CP, hx of TIA in 2018, placed on plavix but stopped 2 yrs ago (as advised by MD), takes daily ASA. Hx of HTN and high cholesterol. Direct bedded. MD aware and coming to see patient.        History of Present Illness:  [name] Cook is a 46 y.o. female who presents to the Emergency Department with Right-sided facial droop, right-sided facial, arm and leg numbness and weakness.  Also headache on the left side of her head and chest pain.  Says that she started feeling paresthesias on the right side of her body last night.  Today around 6 AM went to the restroom, lost consciousness, and then when she woke up noticed that she had facial droop and weakness.  Says that she has history of headaches, but not of migraines.  Says that the chest pain feels like heaviness like someone is sitting on her chest, but also stabbing and radiating to her back.  History of a stroke in 2015 without residual deficits.  History of seizures.                                                      Past Medical History:   Diagnosis Date   . Hypertension    . Seizures (CMS/HCC)    . Stroke (CMS/HCC)        Patient Active Problem List   Diagnosis   . Abdominal wall cellulitis   . Wound discharge   . Abdominal wall pain   . Abdominal wall seroma   . Uncontrolled hypertension   . Facial droop   . Numbness and tingling in right hand   . Right sided weakness   . Hypertensive urgency   . Migraine equivalent       Past Surgical History:  No past surgical history on file.    Family History:  No family history on file.    Social History:   reports that she has never smoked. She has never used smokeless tobacco. She reports that she does not currently use alcohol. She reports that she does not currently use drugs.     Review of Systems    ED  Triage Vitals   Temp Heart Rate Resp BP   02/17/22 1122 02/17/22 1122 02/17/22 1122 02/17/22 1122   36.8 C (98.3 F) 73 20 (!) 170/97      SpO2 Temp src Heart Rate Source Patient Position   02/17/22 1122 -- 02/17/22 1150 --   100 %  Monitor       BP Location FiO2 (%)     -- --             Physical Exam  Constitutional:       Appearance: She is not toxic-appearing.   HENT:      Head: Normocephalic and atraumatic.   Eyes:      Conjunctiva/sclera: Conjunctivae normal.   Cardiovascular:      Pulses: Normal pulses.      Comments: 1 + radial pulses b/l. 2+ DP pulses b/l  Pulmonary:  Effort: Pulmonary effort is normal.   Musculoskeletal:      Cervical back: Normal range of motion and neck supple.   Skin:     General: Skin is warm and dry.   Neurological:      Mental Status: She is alert and oriented to person, place, and time.      Cranial Nerves: Cranial nerve deficit present.      Sensory: Sensory deficit present.      Motor: Weakness present.      Coordination: Coordination normal.      Comments: R arm drift, R leg effort against gravity, but touches bed after 2 seconds of hip flexion. Decreased sensation to tough of R side face, arm and leg compared to right. Normal speech. L facial droop.    Psychiatric:         Mood and Affect: Mood normal.         Behavior: Behavior normal.         Data:  Labs Reviewed   BASIC METABOLIC PANEL - Abnormal       Result Value    Sodium 141      Potassium 3.8      Chloride 106      CO2 29      Anion Gap 6      Glucose 128 (*)     BUN 13      Creatinine 0.80      eGFR 93      BUN/Creatinine Ratio 16.3      Calcium 9.3     CBC WITH AUTO DIFFERENTIAL - Abnormal    WBC 7.5      RBC 4.45      Hemoglobin 13.5      Hematocrit 42.2      MCV 94.8      MCH 30.3      MCHC 32.0      RDW 12.8      Platelets 166      MPV 13.6 (*)     Neutrophils Relative 63.6      Immature Granulocytes Relative 0.1      Lymphocytes Relative 28.1      Monocytes Relative 5.4      Eosinophils Relative 2.5       Basophils Relative 0.3      Neutrophils Absolute 4.75      Immature Granulocytes Absolute 0.01      Lymphocytes Absolute 2.10      Monocytes Absolute 0.40      Eosinophils Absolute 0.19      Basophils Absolute 0.02      NRBC 0.0     HEPATIC FUNCTION PANEL - Abnormal    Total Protein 8.1      Albumin 4.3      Total Bilirubin 0.8      Bilirubin, Direct 0.2      Bilirubin, Indirect 0.6      ALT (SGPT) 21      AST (SGOT) 18      Alkaline Phosphatase 120 (*)    SEDIMENTATION RATE - Abnormal    Sed Rate 70 (*)    HEMOGLOBIN A1C - Abnormal    Hemoglobin A1C 6.3 (*)     Mean Bld Glu Estim. 134      Narrative:     %A1c(NGSP) Interpretation:   <6.0% Non-Diabetic Normal     6.0% - 7.0% ADA Therapeutic Target      >7.0% Action Suggested     URINALYSIS WITH REFLEX MICROSCOPIC  AND CULTURE - Abnormal    Color, Urine Yellow      Clarity, Urine Cloudy (*)     Specific Gravity Urine 1.028      pH, Urine 5.0      Leukocytes, Urine Trace (*)     Nitrite, Urine Negative      Protein, Urine 30 (*)     Glucose, Urine 150 (*)     Ketones, Urine Negative      Urobilinogen, Urine 2.0      Bilirubin, Urine Negative      Blood, Urine Negative      RBC, Urine 2 (*)     WBC, Urine 3 (*)     Bacteria, Urine None      Squamous Epithelial, Urine Moderate (*)     Mucus, Urine Present (*)    COMPREHENSIVE METABOLIC PANEL - Abnormal    Sodium 137      Potassium 4.2      Chloride 105      CO2 26      Anion Gap 6      Glucose 169 (*)     BUN 15      Creatinine 0.83      eGFR 89      BUN/Creatinine Ratio 18.1      Calcium 9.0      AST (SGOT) 17      ALT (SGPT) 21      Alkaline Phosphatase 117      Total Protein 7.7      Albumin 4.1      Total Bilirubin 0.7     CBC WITH AUTO DIFFERENTIAL - Abnormal    WBC 10.7      RBC 4.35      Hemoglobin 13.6      Hematocrit 41.1      MCV 94.5      MCH 31.3      MCHC 33.1      RDW 12.8      Platelets 163      MPV 13.1 (*)     Neutrophils Relative 85.0      Immature Granulocytes Relative 0.3      Lymphocytes Relative  11.5 (*)     Monocytes Relative 2.3      Eosinophils Relative 0.4      Basophils Relative 0.5      Neutrophils Absolute 9.05      Immature Granulocytes Absolute 0.03      Lymphocytes Absolute 1.23      Monocytes Absolute 0.25      Eosinophils Absolute 0.04      Basophils Absolute 0.05      NRBC 0.0     COMPREHENSIVE METABOLIC PANEL - Abnormal    Sodium 141      Potassium 3.7 (*)     Chloride 107      CO2 30      Anion Gap 4      Glucose 123 (*)     BUN 17      Creatinine 0.80      eGFR 93      BUN/Creatinine Ratio 21.3 (*)     Calcium 8.6 (*)     AST (SGOT) 13      ALT (SGPT) 18      Alkaline Phosphatase 97      Total Protein 6.9      Albumin 3.6      Total Bilirubin 0.8     CBC WITH AUTO DIFFERENTIAL - Abnormal  WBC 10.2      RBC 4.17      Hemoglobin 12.6      Hematocrit 40.1      MCV 96.2      MCH 30.2      MCHC 31.4      RDW 12.8      Platelets 158      MPV 13.1 (*)     Neutrophils Relative 62.8      Immature Granulocytes Relative 0.3      Lymphocytes Relative 28.7      Monocytes Relative 6.8      Eosinophils Relative 1.1      Basophils Relative 0.3      Neutrophils Absolute 6.38      Immature Granulocytes Absolute 0.03      Lymphocytes Absolute 2.92      Monocytes Absolute 0.69      Eosinophils Absolute 0.11      Basophils Absolute 0.03      NRBC 0.0     SARS-COV2-RNA, RSV, FLU A AND B QUALITATIVE RT-PCR, INTERNAL LAB - Normal    Influenza A PCR Negative      Influenza B PCR Negative      SARS-COV-2 Screen Negative      RSV PCR Negative     TROPONIN I HIGH SENSITIVITY - Normal    High Sensitivity Troponin I 9     PROTHROMBIN TIME WITH INR - Normal    Protime 11.9      INR 1.1      Narrative:     INR is useful only for patients on anticoagulant therapy.  Recommended theraputic ranges for the INR:  2.0 - 3.0 Prophylaxis of venous thrombosis(high-risk surgery),treatment of venous thrombosis and pulmonary embolis, prevention of systemic embolism.  2.5 - 3.5 Mechanical prosthetic valves(high risk), prevention of  recurrent MI     ACTIVATED PARTIAL THROMBOPLASTIN TIME - Normal    aPTT 34.6     TROPONIN I HIGH SENSITIVITY - Normal    High Sensitivity Troponin I 8     C-REACTIVE PROTEIN - Normal    C-Reactive Protein <0.5     PROCALCITONIN - Normal    Procalcitonin <0.04      Narrative:     Interpretation Guidelines  Diagnosis of systemic bacterial infection/sepsis  <0.50 Low risk for Sepsis, local bacterial infection possible  >=0.5 - <2.0 Sepsis is possible  >=2.0 - <10.0 Sepsis Likely  >=10.0 Severe bacterial sepsis or septic shock probable  Diagnosis of Lower respiratory tract infection  <0.10 Bacterial infection very unlikely  >=0.10 ? <0.25 Bacterial infection unlikely  >=0.25 - <0.50 Bacterial infection likely  >=0.50 Bacterial infection very likely     TROPONIN I HIGH SENSITIVITY - Normal    High Sensitivity Troponin I 7     TROPONIN I HIGH SENSITIVITY - Normal    High Sensitivity Troponin I 8     MAGNESIUM - Normal    Magnesium 1.7     PHOSPHORUS - Normal    Phosphorus 2.7     THYROID STIMULATING HORMONE - Normal    TSH 1.29      Narrative:     Pediatric Normal Values by Age:                                  Female                  Female  Age 78 to <1 month              0.59 - 6.76            0.64-12.75  Age  57 month  to <6 yrs        0.51 - 4.91            0.67-5.97  Age 45yrs - <43yrs              0.36-5.47              0.52-5.08   CBC AND DIFFERENTIAL    Narrative:     The following orders were created for panel order CBC and differential.  Procedure                               Abnormality         Status                     ---------                               -----------         ------                     CBC auto differential[[id number]]        Abnormal            Final result                 Please view results for these tests on the individual orders.   CBC AND DIFFERENTIAL    Narrative:     The following orders were created for panel order CBC and differential.  Procedure                                Abnormality         Status                     ---------                               -----------         ------                     CBC auto differential[[id number]]        Abnormal            Final result                 Please view results for these tests on the individual orders.   LIPID PANEL WITH DIRECT LDL    Cholesterol 164      Triglycerides 84      HDL 36      VLDL Cholesterol Cal 16.8      LDL Direct 112      Narrative:     Result Interpretation:    CHOLESTEROL:     <200  mg/dL  Desirable  161-096  mg/dL  Borderline     >045  mg/dL  High  TRIGLYCERIDE:     <150  mg/dL  Normal   409-811  mg/dL  Borderline high   914-782  mg/dL  High      >500  mg/dL  Very high   HDL:   <16.1  mg/dL  Low  >=09.6  mg/dL  High   LDL:       <045.4  mg/dL  Optimal  098.1-191.4  mg/dL  Near to above optimal  130.0-159.0  mg/dL  Borderline high  782.9-562.1  mg/dL  High       >308.6  mg/dL  Very high         CBC AND DIFFERENTIAL    Narrative:     The following orders were created for panel order CBC and differential.  Procedure                               Abnormality         Status                     ---------                               -----------         ------                     CBC auto differential[[id number]]        Abnormal            Final result                 Please view results for these tests on the individual orders.   LEVETIRACETAM LEVEL       Transthoracic echocardiogram (TTE) complete with PRN contrast, bubble, strain, and 3D order panel   Final Result      [place] Head Neck Soft Tissue   Final Result   Solid nodule of the left thyroid is mildly suspicious (TI-RADS category 3).   Biopsy and/or short-term follow-up suggested.      Electronically Signed:   Eduardo Osier, MD   2022/02/18 at 9:00 EST   Reading Location ID and State: [id number] / [place]   Tel [phone], Service support  [phone], Fax [phone]         CT Chest wo Contrast   Final Result      Mild basilar atelectasis or pneumonia.       Heterogeneous nonspecific density in the thyroid may be due to a multinodular   thyroid goiter and a malignant mass cannot be excluded. If clinically indicated   recommend thyroid ultrasound.      Mild cardiomegaly.      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      Status post cholecystectomy.      Mild degenerative disc disease.      Large body habitus suspicious for associated chronic sleep apnea.      Electronically Signed:   Camillo Flaming, MD   2022/02/18 at 2:33 EST   Reading Location ID and State: [phone] / [place]   Tel [phone], Service support  [phone], Fax [phone]         NM Lung Perfusion Imaging   Final Result      1.  Stable exam.   2.  Pulmonary perfusion examination without evidence of segmental defects. No   evidence of PE.      Electronically Signed:   Joseph Art, MD   2022/02/17 at 23:18 EST   Reading Location ID and State: [phone] / [place]  Tel [phone], Service support  [phone], Fax [phone]         MR Chest wo Contrast   Final Result      1.  Mild ectasia of the ascending aorta however mildly accentuated by cardiac   motion.   2.  No evidence of thoracic aortic aneurysm or dissection. Aortic wall structure   has normal appearance. No bulky soft or calcified plaque noted.   3.  Normal appearance the origin the great vessels.   4.  Cardiac chambers and pericardium have normal appearance.   5.  Patchy interstitial and likely groundglass infiltrate at the lung bases with   small bilateral effusions noted.   6.  Status post cholecystectomy.   7.  Benign LEFT renal cyst.      Electronically Signed:   Joseph Art, MD   2022/02/17 at 17:49 EST   Reading Location ID and State: [id number] / [place]   Tel [phone], Service support  [phone], Fax [phone]         MR Angio Chest wo Contrast   Final Result      1.  Mild ectasia of the ascending aorta however mildly accentuated by cardiac   motion.   2.  No evidence of thoracic aortic aneurysm or dissection. Aortic wall structure    has normal appearance. No bulky soft or calcified plaque noted.   3.  Normal appearance the origin the great vessels.   4.  Cardiac chambers and pericardium have normal appearance.   5.  Patchy interstitial and likely groundglass infiltrate at the lung bases with   small bilateral effusions noted.   6.  Status post cholecystectomy.   7.  Benign LEFT renal cyst.      Electronically Signed:   Joseph Art, MD   2022/02/17 at 17:49 EST   Reading Location ID and State: [address] / [place]   Tel [phone], Service support  [phone], Fax [phone]         XR Chest 1 View   Final Result      Negative low volume portable chest         -------- FINAL REPORT --------   Dictated By: Joneen Roach    Dictated Date: 02/17/2022 14:30   Assigned Physician: Joneen Roach    Reviewed and Electronically Signed By: Joneen Roach    Signed Date: 02/17/2022 14:31   Workstation ID: [id number]   Transcribed By: Self Edit    Transcribed Date: 02/17/2022 14:30         MR Angio Head wo Contrast   Final Result   Normal MRA of the head. No aneurysm or large vessel occlusion.      Electronically Signed:   Eduardo Osier, MD   2022/02/17 at 13:37 EST   Reading Location ID and State: [id number] / [place]   Tel [phone], Service support  [phone], Fax [phone]         MR Brain wo Contrast   Final Result   No evidence of acute infarct.      Stable minimal nonspecific white matter disease.      -------- FINAL REPORT --------   Dictated By: Joneen Roach    Dictated Date: 02/17/2022 13:00   Assigned Physician: Joneen Roach    Reviewed and Electronically Signed By: Joneen Roach    Signed Date: 02/17/2022 13:05   Workstation ID: [id number]   Transcribed By: Self Edit    Transcribed Date: 02/17/2022 13:00         MR Angio Neck wo Contrast  Final Result   Normal bilateral cervical carotid and vertebral arteries.      Electronically Signed:   Eduardo Osier, MD   2022/02/17 at 13:42 EST   Reading Location ID and State:  [id number] / [place]   Tel [phone], Service support  [phone], Fax [phone]         CT Head wo Contrast   Final Result      Normal head CT.  Consider MRI if there is continued clinical concern for acute CVA.      -------- FINAL REPORT --------   Dictated By: Joneen Roach    Dictated Date: 02/17/2022 11:59   Assigned Physician: Joneen Roach    Reviewed and Electronically Signed By: Joneen Roach    Signed Date: 02/17/2022 12:00   Workstation ID: [id number]   Transcribed By: Shona Needles    Transcribed Date: 02/17/2022 11:59             Medical Decision Making  Patient presenting to the emergency department with right-sided arm and leg weakness, left-sided facial droop, decreased sensation to touch of right side compared to left, chest pain, headache, history of seizures, syncopal episode.  Concern for stroke versus Todd's paralysis versus atypical migraine versus aortic dissection versus ACS vs conversion disorder.         Problems Addressed:  Acute intractable headache, unspecified headache type: acute illness or injury  Chest pain, unspecified type: acute illness or injury  Right sided weakness: acute illness or injury that poses a threat to life or bodily functions        ED Course as of 02/19/22 1818   Fri Feb 17, 2022   1207 Upon patient evaluation, code stroke alert due to patient being within 24 hours of onset of symptoms.  Neurology nurse practitioner at bedside recommends priority 1 MRI/MRA of the brain for evaluation for signs of large vessel occlusion as patient is allergic to CT contrast with reaction anaphylaxis [PL]   1308 EKG interpreted by me: nl sinus rhythm @ 65 bpm, normal PR interval and QRS duration, nl QTC, normal ST segments [PL]   1312 Patient complains of chest pain.  Says that she has had the chest pain all morning.  She has multiple medication allergies.  However, says that she is able to take morphine if Benadryl was given with aids to prevent itching.  She is able to take  Dilaudid without any premedication.  Will give a dose of Dilaudid. [PL]   1313 Repeat EKG interpreted by me: nl sinus rhythm, normal PR interval and QRS duration, nl QTC, normal ST segments [PL]   1359 Stable repeat troponin.  Unlikely MI. [PL]   1359 No signs of stroke on MRI.  No signs of dissection on MRA brain and neck.  Will give labetalol IV for patient's high blood pressure [PL]   1431 Given negative signs of stroke on MRI and patient also reporting chest pain that radiates to her back and is pressure/stabbing in nature, will obtain MRA of her chest to evaluate her aorta for possible aortic dissection. [PL]   1441 Patient states she is allergic to MRI contrast as well.  Says that she gets throat swelling with them as well.  Will do MRA wo contrast of the chest.   [PL]   1448 Spoke with MRI tech.  He discussed case with supervisor.  Given no contrast and patient being obese, chance of visualizing aorta is better on MRI chest rather than MRA chest. Will change order.  [  PL]   1755 No thoracic aortic dissection.  No aneurysm.  Mild bibasilar infiltrates with small bilateral pleural effusions on MRI.  Will check COVID test.  Possible mild fluid overload from receiving IV fluids in the ED.  However, patient does not have cough, fever, or elevated white cell count to suspect pneumonia.  Will give a dose of Lasix IV. [PL]   1803 Reassessed patient.  No change in neurologic deficits.  No change in headache after Reglan and Benadryl. [PL]   1835 Discussed with neurologist.  Can try sumatriptan for headache.  If that does not work, can try magnesium.  If that does not work, can try Fioricet.  However, patient is allergic to Tylenol, so Fioricet would not be an option. [PL]   [id number] Discussed with hospitalist.  Will place under observation [PL]      ED Course User Index  [PL] Merri Ray, MD         Clinical Impressions as of 02/19/22 1818   Chest pain, unspecified type   Right sided weakness   Acute intractable  headache, unspecified headache type       ED Medication Administration from 02/17/2022 1119 to 02/17/2022 2036         Date/Time Order Dose Route Action Action by     02/17/2022 1213 EST LORazepam (ATIVAN) injection 1 mg 1 mg intravenous Given Majano, J     02/17/2022 1322 EST HYDROmorphone (PF) (DILAUDID) injection 1 mg 1 mg intravenous Given Majano, J     02/17/2022 1418 EST labetalol (NORMODYNE) injection 20 mg 20 mg intravenous Given Majano, J     02/17/2022 1528 EST LORazepam (ATIVAN) injection 0.5 mg 0.5 mg intravenous Given Cotto -Torres, L     02/17/2022 1440 EST metoclopramide (REGLAN) injection 10 mg 10 mg intravenous Given Majano, J     02/17/2022 1441 EST diphenhydrAMINE (BENADRYL) injection 50 mg 50 mg intravenous Given Majano, J     02/17/2022 1505 EST sodium chloride 0.9 % bolus 1,000 mL 1,000 mL intravenous New Bag Cotto -Torres, L     02/17/2022 1728 EST sodium chloride 0.9 % bolus 1,000 mL 0 mL intravenous Stopped Cotto -Torres, L     02/17/2022 1826 EST furosemide (LASIX) injection 20 mg 20 mg intravenous Given Cotto -Torres, L     02/17/2022 1925 EST SUMAtriptan (IMITREX) tablet 25 mg 25 mg oral Given Boateng, B             Critical Care    Performed by: Merri Ray, MD  Authorized by: Merri Ray, MD    Critical care provider statement:     Critical care time (minutes):  50    Critical care time was exclusive of:  Separately billable procedures and treating other patients    Critical care was necessary to treat or prevent imminent or life-threatening deterioration of the following conditions:  CNS failure or compromise    Critical care was time spent personally by me on the following activities:  Discussions with consultants, examination of patient, obtaining history from patient or surrogate, pulse oximetry, re-evaluation of patient's condition, ordering and performing treatments and interventions, ordering and review of laboratory studies and ordering and review of radiographic studies     I assumed direction of critical care for this patient from another provider in my specialty: no      Care discussed with: admitting provider        Disposition:  Admit to Inpatient    Current Discharge Medication List  Merri Ray, MD  02/17/22 1435       Merri Ray, MD  02/17/22 1478       Merri Ray, MD  02/17/22 2956       Merri Ray, MD  02/19/22 2130       Merri Ray, MD  02/19/22 8657
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
## 2022-03-25 NOTE — H&P (Signed)
Direct Care Medicine Service  History & Physical (03/25/22)      Name: [name] Cook  Date of Birth: December 26, 1975  Gender: female  PCP: Tbd, To Be Determined Admit Date: 03/23/2022 LOS: (0 Days)  MRN: [id number]  Room: 2N-302/2N-302-1 Code Status: Prior  Allergies: Acetaminophen, Butalbital-apap-caffeine, Cyclobenzaprine, Hydrocodone-acetaminophen, Ibuprofen, Iodinated Contrast - Iv Or Oral, Iodinated Contrast Media, Ketorolac Tromethamine, Nsaids, Oxycodone-acetaminophen, Shellfish-derived Products, Tramadol, Ceftriaxone, Ciprofloxacin, Dicyclomine, Eggs Or Egg-derived Products, Metoclopramide, Promethazine  Isolation:      Chief Complaint   Chief Complaint   Patient presents with   . Chest Pain        History of Present Illness   [name] Cook is a 47 y.o. female who  has a past medical history of Cancer (CMS/HCC), Cervical cancer (CMS/HCC), CVA (cerebral vascular accident) (CMS/HCC) (2015), HLD (hyperlipidemia), HTN (hypertension), Hypertension (12/15/1992), Morbid obesity with BMI of 40.0-44.9, adult (CMS/HCC), Seizure disorder (CMS/HCC), Seizures (CMS/HCC), and TIA (transient ischemic attack). and presented to [facility] ER with substernal chest pain waking her from sleep at 1 AM on 1/11.  She states the pain radiated down her left arm and was associated with dizziness. She states she sat up and walked in an effort to relieve the pain but the pain did not get better.  She was able to doze back off to sleep.  When she woke later to go to work she still have the pain and dizziness. Denies any change in dizziness when laying, sitting, or stand. While walking down the hallway at work she developed worsening pain, diaphoresis, shortness of breath, near syncope, palpitations.  She was taken to the nurses office where she was told her blood pressure was elevated and she was encouraged to come to the ER for evaluation. The chest pain has been constant for the 2 days since 1/11. Feels the shortness of breath when the chest pain  intensifies. Walking to the bathroom makes her shortness of breath worse. No leg swelling that she knows of. Denies having chest pain, shortness of breath, dizziness like this before. Had a V/Q scan previously for elevated d-dimer and had no adverse reactions to V/Q scan. Admits to anaphylaxis to iodinated contrast. Admits to b/l calf pain started yesterday. Denies immobilization or surgery/trauma within past 4 weeks. Never had a blood clot before. Has cervical cancer in remission and not treated within the last 6 motnhs. Denies hormone use or hemoptysis. Has nausea right now. Vomited yellow emesis at least 6 times in last 24 hours. Ice chips helps her nausea. Morphine wasn't helping with her chest pain. Chronically has orthopnea for years not changed. She has received Dilaudid before without adverse reaction.    Medications administered in the ED   aspirin chewable tablet 81 mg (81 mg Oral Given 03/24/22 1821)   morphine (PF) 4 mg/mL injection 4 mg (4 mg Intravenous Given 03/24/22 2032)   amLODIPine (NORVASC) tablet 5 mg (5 mg Oral Given 03/24/22 0950)   atorvastatin (LIPITOR) tablet 40 mg (40 mg Oral Given 03/24/22 2145)   hydrALAZINE (APRESOLINE) tablet 25 mg (25 mg Oral Given 03/24/22 2145)   hydroCHLOROthiazide tablet 25 mg (25 mg Oral Given 03/24/22 0950)   levETIRAcetam (KEPPRA) tablet 500 mg (500 mg Oral Given 03/24/22 2145)   lisinopril (ZESTRIL) tablet 10 mg (10 mg Oral Given 03/24/22 0950)   morphine (PF) 4 mg/mL injection 4 mg (4 mg Intravenous Given 03/23/22 2109)   aspirin chewable tablet 81 mg (81 mg Oral Given 03/23/22 1904)   potassium chloride (KLOR-CON  Direct Care Medicine Service  History & Physical (03/25/22)      Name: [name] Cook  Date of Birth: December 26, 1975  Gender: female  PCP: Tbd, To Be Determined Admit Date: 03/23/2022 LOS: (0 Days)  MRN: [id number]  Room: 2N-302/2N-302-1 Code Status: Prior  Allergies: Acetaminophen, Butalbital-apap-caffeine, Cyclobenzaprine, Hydrocodone-acetaminophen, Ibuprofen, Iodinated Contrast - Iv Or Oral, Iodinated Contrast Media, Ketorolac Tromethamine, Nsaids, Oxycodone-acetaminophen, Shellfish-derived Products, Tramadol, Ceftriaxone, Ciprofloxacin, Dicyclomine, Eggs Or Egg-derived Products, Metoclopramide, Promethazine  Isolation:      Chief Complaint   Chief Complaint   Patient presents with   . Chest Pain        History of Present Illness   [name] Cook is a 47 y.o. female who  has a past medical history of Cancer (CMS/HCC), Cervical cancer (CMS/HCC), CVA (cerebral vascular accident) (CMS/HCC) (2015), HLD (hyperlipidemia), HTN (hypertension), Hypertension (12/15/1992), Morbid obesity with BMI of 40.0-44.9, adult (CMS/HCC), Seizure disorder (CMS/HCC), Seizures (CMS/HCC), and TIA (transient ischemic attack). and presented to [facility] ER with substernal chest pain waking her from sleep at 1 AM on 1/11.  She states the pain radiated down her left arm and was associated with dizziness. She states she sat up and walked in an effort to relieve the pain but the pain did not get better.  She was able to doze back off to sleep.  When she woke later to go to work she still have the pain and dizziness. Denies any change in dizziness when laying, sitting, or stand. While walking down the hallway at work she developed worsening pain, diaphoresis, shortness of breath, near syncope, palpitations.  She was taken to the nurses office where she was told her blood pressure was elevated and she was encouraged to come to the ER for evaluation. The chest pain has been constant for the 2 days since 1/11. Feels the shortness of breath when the chest pain  intensifies. Walking to the bathroom makes her shortness of breath worse. No leg swelling that she knows of. Denies having chest pain, shortness of breath, dizziness like this before. Had a V/Q scan previously for elevated d-dimer and had no adverse reactions to V/Q scan. Admits to anaphylaxis to iodinated contrast. Admits to b/l calf pain started yesterday. Denies immobilization or surgery/trauma within past 4 weeks. Never had a blood clot before. Has cervical cancer in remission and not treated within the last 6 motnhs. Denies hormone use or hemoptysis. Has nausea right now. Vomited yellow emesis at least 6 times in last 24 hours. Ice chips helps her nausea. Morphine wasn't helping with her chest pain. Chronically has orthopnea for years not changed. She has received Dilaudid before without adverse reaction.    Medications administered in the ED   aspirin chewable tablet 81 mg (81 mg Oral Given 03/24/22 1821)   morphine (PF) 4 mg/mL injection 4 mg (4 mg Intravenous Given 03/24/22 2032)   amLODIPine (NORVASC) tablet 5 mg (5 mg Oral Given 03/24/22 0950)   atorvastatin (LIPITOR) tablet 40 mg (40 mg Oral Given 03/24/22 2145)   hydrALAZINE (APRESOLINE) tablet 25 mg (25 mg Oral Given 03/24/22 2145)   hydroCHLOROthiazide tablet 25 mg (25 mg Oral Given 03/24/22 0950)   levETIRAcetam (KEPPRA) tablet 500 mg (500 mg Oral Given 03/24/22 2145)   lisinopril (ZESTRIL) tablet 10 mg (10 mg Oral Given 03/24/22 0950)   morphine (PF) 4 mg/mL injection 4 mg (4 mg Intravenous Given 03/23/22 2109)   aspirin chewable tablet 81 mg (81 mg Oral Given 03/23/22 1904)   potassium chloride (KLOR-CON  [facility]                                                                            [place], [place] [address]                                        Adult Echocardiogram Report  Name: JARLENE, ACIERNO Date: 03/25/2022 11:16 AM                     BP: 110/63 mmHg  MRN: [id number]                  Patient Location: CAP-2NM^2N302^2N-302-1^PMS        HR: 70  DOB: 1975-05-11                Gender: Female                                      Height: 70 in  Age: 89 yrs                                                                        Weight: 310 lb  Reason For Study: chest pain                                                       BSA: 2.5 m2    Ordering Physician: Driscilla Grammes SUNG  Referring Physician: Driscilla Grammes SUNG  Performed By: Freddi Che    Procedure  A complete two-dimensional transthoracic echocardiogram was performed (2D, M-mode, spectral and  color flow Doppler). ([id number]). A two-dimensional transthoracic echocardiogram with color flow  Doppler was performed. ([id number], [id number]).    MMode/2D Measurements & Calculations  IVSd: 1.1 cm                                        LVIDd: 3.9 cm  Direct Care Medicine Service  History & Physical (03/25/22)      Name: [name] Cook  Date of Birth: December 26, 1975  Gender: female  PCP: Tbd, To Be Determined Admit Date: 03/23/2022 LOS: (0 Days)  MRN: [id number]  Room: 2N-302/2N-302-1 Code Status: Prior  Allergies: Acetaminophen, Butalbital-apap-caffeine, Cyclobenzaprine, Hydrocodone-acetaminophen, Ibuprofen, Iodinated Contrast - Iv Or Oral, Iodinated Contrast Media, Ketorolac Tromethamine, Nsaids, Oxycodone-acetaminophen, Shellfish-derived Products, Tramadol, Ceftriaxone, Ciprofloxacin, Dicyclomine, Eggs Or Egg-derived Products, Metoclopramide, Promethazine  Isolation:      Chief Complaint   Chief Complaint   Patient presents with   . Chest Pain        History of Present Illness   [name] Cook is a 47 y.o. female who  has a past medical history of Cancer (CMS/HCC), Cervical cancer (CMS/HCC), CVA (cerebral vascular accident) (CMS/HCC) (2015), HLD (hyperlipidemia), HTN (hypertension), Hypertension (12/15/1992), Morbid obesity with BMI of 40.0-44.9, adult (CMS/HCC), Seizure disorder (CMS/HCC), Seizures (CMS/HCC), and TIA (transient ischemic attack). and presented to [facility] ER with substernal chest pain waking her from sleep at 1 AM on 1/11.  She states the pain radiated down her left arm and was associated with dizziness. She states she sat up and walked in an effort to relieve the pain but the pain did not get better.  She was able to doze back off to sleep.  When she woke later to go to work she still have the pain and dizziness. Denies any change in dizziness when laying, sitting, or stand. While walking down the hallway at work she developed worsening pain, diaphoresis, shortness of breath, near syncope, palpitations.  She was taken to the nurses office where she was told her blood pressure was elevated and she was encouraged to come to the ER for evaluation. The chest pain has been constant for the 2 days since 1/11. Feels the shortness of breath when the chest pain  intensifies. Walking to the bathroom makes her shortness of breath worse. No leg swelling that she knows of. Denies having chest pain, shortness of breath, dizziness like this before. Had a V/Q scan previously for elevated d-dimer and had no adverse reactions to V/Q scan. Admits to anaphylaxis to iodinated contrast. Admits to b/l calf pain started yesterday. Denies immobilization or surgery/trauma within past 4 weeks. Never had a blood clot before. Has cervical cancer in remission and not treated within the last 6 motnhs. Denies hormone use or hemoptysis. Has nausea right now. Vomited yellow emesis at least 6 times in last 24 hours. Ice chips helps her nausea. Morphine wasn't helping with her chest pain. Chronically has orthopnea for years not changed. She has received Dilaudid before without adverse reaction.    Medications administered in the ED   aspirin chewable tablet 81 mg (81 mg Oral Given 03/24/22 1821)   morphine (PF) 4 mg/mL injection 4 mg (4 mg Intravenous Given 03/24/22 2032)   amLODIPine (NORVASC) tablet 5 mg (5 mg Oral Given 03/24/22 0950)   atorvastatin (LIPITOR) tablet 40 mg (40 mg Oral Given 03/24/22 2145)   hydrALAZINE (APRESOLINE) tablet 25 mg (25 mg Oral Given 03/24/22 2145)   hydroCHLOROthiazide tablet 25 mg (25 mg Oral Given 03/24/22 0950)   levETIRAcetam (KEPPRA) tablet 500 mg (500 mg Oral Given 03/24/22 2145)   lisinopril (ZESTRIL) tablet 10 mg (10 mg Oral Given 03/24/22 0950)   morphine (PF) 4 mg/mL injection 4 mg (4 mg Intravenous Given 03/23/22 2109)   aspirin chewable tablet 81 mg (81 mg Oral Given 03/23/22 1904)   potassium chloride (KLOR-CON  [facility]                                                                            [place], [place] [address]                                        Adult Echocardiogram Report  Name: JARLENE, ACIERNO Date: 03/25/2022 11:16 AM                     BP: 110/63 mmHg  MRN: [id number]                  Patient Location: CAP-2NM^2N302^2N-302-1^PMS        HR: 70  DOB: 1975-05-11                Gender: Female                                      Height: 70 in  Age: 89 yrs                                                                        Weight: 310 lb  Reason For Study: chest pain                                                       BSA: 2.5 m2    Ordering Physician: Driscilla Grammes SUNG  Referring Physician: Driscilla Grammes SUNG  Performed By: Freddi Che    Procedure  A complete two-dimensional transthoracic echocardiogram was performed (2D, M-mode, spectral and  color flow Doppler). ([id number]). A two-dimensional transthoracic echocardiogram with color flow  Doppler was performed. ([id number], [id number]).    MMode/2D Measurements & Calculations  IVSd: 1.1 cm                                        LVIDd: 3.9 cm  [facility]                                                                            [place], [place] [address]                                        Adult Echocardiogram Report  Name: JARLENE, ACIERNO Date: 03/25/2022 11:16 AM                     BP: 110/63 mmHg  MRN: [id number]                  Patient Location: CAP-2NM^2N302^2N-302-1^PMS        HR: 70  DOB: 1975-05-11                Gender: Female                                      Height: 70 in  Age: 89 yrs                                                                        Weight: 310 lb  Reason For Study: chest pain                                                       BSA: 2.5 m2    Ordering Physician: Driscilla Grammes SUNG  Referring Physician: Driscilla Grammes SUNG  Performed By: Freddi Che    Procedure  A complete two-dimensional transthoracic echocardiogram was performed (2D, M-mode, spectral and  color flow Doppler). ([id number]). A two-dimensional transthoracic echocardiogram with color flow  Doppler was performed. ([id number], [id number]).    MMode/2D Measurements & Calculations  IVSd: 1.1 cm                                        LVIDd: 3.9 cm  Direct Care Medicine Service  History & Physical (03/25/22)      Name: [name] Cook  Date of Birth: December 26, 1975  Gender: female  PCP: Tbd, To Be Determined Admit Date: 03/23/2022 LOS: (0 Days)  MRN: [id number]  Room: 2N-302/2N-302-1 Code Status: Prior  Allergies: Acetaminophen, Butalbital-apap-caffeine, Cyclobenzaprine, Hydrocodone-acetaminophen, Ibuprofen, Iodinated Contrast - Iv Or Oral, Iodinated Contrast Media, Ketorolac Tromethamine, Nsaids, Oxycodone-acetaminophen, Shellfish-derived Products, Tramadol, Ceftriaxone, Ciprofloxacin, Dicyclomine, Eggs Or Egg-derived Products, Metoclopramide, Promethazine  Isolation:      Chief Complaint   Chief Complaint   Patient presents with   . Chest Pain        History of Present Illness   [name] Cook is a 47 y.o. female who  has a past medical history of Cancer (CMS/HCC), Cervical cancer (CMS/HCC), CVA (cerebral vascular accident) (CMS/HCC) (2015), HLD (hyperlipidemia), HTN (hypertension), Hypertension (12/15/1992), Morbid obesity with BMI of 40.0-44.9, adult (CMS/HCC), Seizure disorder (CMS/HCC), Seizures (CMS/HCC), and TIA (transient ischemic attack). and presented to [facility] ER with substernal chest pain waking her from sleep at 1 AM on 1/11.  She states the pain radiated down her left arm and was associated with dizziness. She states she sat up and walked in an effort to relieve the pain but the pain did not get better.  She was able to doze back off to sleep.  When she woke later to go to work she still have the pain and dizziness. Denies any change in dizziness when laying, sitting, or stand. While walking down the hallway at work she developed worsening pain, diaphoresis, shortness of breath, near syncope, palpitations.  She was taken to the nurses office where she was told her blood pressure was elevated and she was encouraged to come to the ER for evaluation. The chest pain has been constant for the 2 days since 1/11. Feels the shortness of breath when the chest pain  intensifies. Walking to the bathroom makes her shortness of breath worse. No leg swelling that she knows of. Denies having chest pain, shortness of breath, dizziness like this before. Had a V/Q scan previously for elevated d-dimer and had no adverse reactions to V/Q scan. Admits to anaphylaxis to iodinated contrast. Admits to b/l calf pain started yesterday. Denies immobilization or surgery/trauma within past 4 weeks. Never had a blood clot before. Has cervical cancer in remission and not treated within the last 6 motnhs. Denies hormone use or hemoptysis. Has nausea right now. Vomited yellow emesis at least 6 times in last 24 hours. Ice chips helps her nausea. Morphine wasn't helping with her chest pain. Chronically has orthopnea for years not changed. She has received Dilaudid before without adverse reaction.    Medications administered in the ED   aspirin chewable tablet 81 mg (81 mg Oral Given 03/24/22 1821)   morphine (PF) 4 mg/mL injection 4 mg (4 mg Intravenous Given 03/24/22 2032)   amLODIPine (NORVASC) tablet 5 mg (5 mg Oral Given 03/24/22 0950)   atorvastatin (LIPITOR) tablet 40 mg (40 mg Oral Given 03/24/22 2145)   hydrALAZINE (APRESOLINE) tablet 25 mg (25 mg Oral Given 03/24/22 2145)   hydroCHLOROthiazide tablet 25 mg (25 mg Oral Given 03/24/22 0950)   levETIRAcetam (KEPPRA) tablet 500 mg (500 mg Oral Given 03/24/22 2145)   lisinopril (ZESTRIL) tablet 10 mg (10 mg Oral Given 03/24/22 0950)   morphine (PF) 4 mg/mL injection 4 mg (4 mg Intravenous Given 03/23/22 2109)   aspirin chewable tablet 81 mg (81 mg Oral Given 03/23/22 1904)   potassium chloride (KLOR-CON  [facility]                                                                            [place], [place] [address]                                        Adult Echocardiogram Report  Name: JARLENE, ACIERNO Date: 03/25/2022 11:16 AM                     BP: 110/63 mmHg  MRN: [id number]                  Patient Location: CAP-2NM^2N302^2N-302-1^PMS        HR: 70  DOB: 1975-05-11                Gender: Female                                      Height: 70 in  Age: 89 yrs                                                                        Weight: 310 lb  Reason For Study: chest pain                                                       BSA: 2.5 m2    Ordering Physician: Driscilla Grammes SUNG  Referring Physician: Driscilla Grammes SUNG  Performed By: Freddi Che    Procedure  A complete two-dimensional transthoracic echocardiogram was performed (2D, M-mode, spectral and  color flow Doppler). ([id number]). A two-dimensional transthoracic echocardiogram with color flow  Doppler was performed. ([id number], [id number]).    MMode/2D Measurements & Calculations  IVSd: 1.1 cm                                        LVIDd: 3.9 cm  Direct Care Medicine Service  History & Physical (03/25/22)      Name: [name] Cook  Date of Birth: December 26, 1975  Gender: female  PCP: Tbd, To Be Determined Admit Date: 03/23/2022 LOS: (0 Days)  MRN: [id number]  Room: 2N-302/2N-302-1 Code Status: Prior  Allergies: Acetaminophen, Butalbital-apap-caffeine, Cyclobenzaprine, Hydrocodone-acetaminophen, Ibuprofen, Iodinated Contrast - Iv Or Oral, Iodinated Contrast Media, Ketorolac Tromethamine, Nsaids, Oxycodone-acetaminophen, Shellfish-derived Products, Tramadol, Ceftriaxone, Ciprofloxacin, Dicyclomine, Eggs Or Egg-derived Products, Metoclopramide, Promethazine  Isolation:      Chief Complaint   Chief Complaint   Patient presents with   . Chest Pain        History of Present Illness   [name] Cook is a 47 y.o. female who  has a past medical history of Cancer (CMS/HCC), Cervical cancer (CMS/HCC), CVA (cerebral vascular accident) (CMS/HCC) (2015), HLD (hyperlipidemia), HTN (hypertension), Hypertension (12/15/1992), Morbid obesity with BMI of 40.0-44.9, adult (CMS/HCC), Seizure disorder (CMS/HCC), Seizures (CMS/HCC), and TIA (transient ischemic attack). and presented to [facility] ER with substernal chest pain waking her from sleep at 1 AM on 1/11.  She states the pain radiated down her left arm and was associated with dizziness. She states she sat up and walked in an effort to relieve the pain but the pain did not get better.  She was able to doze back off to sleep.  When she woke later to go to work she still have the pain and dizziness. Denies any change in dizziness when laying, sitting, or stand. While walking down the hallway at work she developed worsening pain, diaphoresis, shortness of breath, near syncope, palpitations.  She was taken to the nurses office where she was told her blood pressure was elevated and she was encouraged to come to the ER for evaluation. The chest pain has been constant for the 2 days since 1/11. Feels the shortness of breath when the chest pain  intensifies. Walking to the bathroom makes her shortness of breath worse. No leg swelling that she knows of. Denies having chest pain, shortness of breath, dizziness like this before. Had a V/Q scan previously for elevated d-dimer and had no adverse reactions to V/Q scan. Admits to anaphylaxis to iodinated contrast. Admits to b/l calf pain started yesterday. Denies immobilization or surgery/trauma within past 4 weeks. Never had a blood clot before. Has cervical cancer in remission and not treated within the last 6 motnhs. Denies hormone use or hemoptysis. Has nausea right now. Vomited yellow emesis at least 6 times in last 24 hours. Ice chips helps her nausea. Morphine wasn't helping with her chest pain. Chronically has orthopnea for years not changed. She has received Dilaudid before without adverse reaction.    Medications administered in the ED   aspirin chewable tablet 81 mg (81 mg Oral Given 03/24/22 1821)   morphine (PF) 4 mg/mL injection 4 mg (4 mg Intravenous Given 03/24/22 2032)   amLODIPine (NORVASC) tablet 5 mg (5 mg Oral Given 03/24/22 0950)   atorvastatin (LIPITOR) tablet 40 mg (40 mg Oral Given 03/24/22 2145)   hydrALAZINE (APRESOLINE) tablet 25 mg (25 mg Oral Given 03/24/22 2145)   hydroCHLOROthiazide tablet 25 mg (25 mg Oral Given 03/24/22 0950)   levETIRAcetam (KEPPRA) tablet 500 mg (500 mg Oral Given 03/24/22 2145)   lisinopril (ZESTRIL) tablet 10 mg (10 mg Oral Given 03/24/22 0950)   morphine (PF) 4 mg/mL injection 4 mg (4 mg Intravenous Given 03/23/22 2109)   aspirin chewable tablet 81 mg (81 mg Oral Given 03/23/22 1904)   potassium chloride (KLOR-CON  [facility]                                                                            [place], [place] [address]                                        Adult Echocardiogram Report  Name: JARLENE, ACIERNO Date: 03/25/2022 11:16 AM                     BP: 110/63 mmHg  MRN: [id number]                  Patient Location: CAP-2NM^2N302^2N-302-1^PMS        HR: 70  DOB: 1975-05-11                Gender: Female                                      Height: 70 in  Age: 89 yrs                                                                        Weight: 310 lb  Reason For Study: chest pain                                                       BSA: 2.5 m2    Ordering Physician: Driscilla Grammes SUNG  Referring Physician: Driscilla Grammes SUNG  Performed By: Freddi Che    Procedure  A complete two-dimensional transthoracic echocardiogram was performed (2D, M-mode, spectral and  color flow Doppler). ([id number]). A two-dimensional transthoracic echocardiogram with color flow  Doppler was performed. ([id number], [id number]).    MMode/2D Measurements & Calculations  IVSd: 1.1 cm                                        LVIDd: 3.9 cm  [facility]                                                                            [place], [place] [address]                                        Adult Echocardiogram Report  Name: JARLENE, ACIERNO Date: 03/25/2022 11:16 AM                     BP: 110/63 mmHg  MRN: [id number]                  Patient Location: CAP-2NM^2N302^2N-302-1^PMS        HR: 70  DOB: 1975-05-11                Gender: Female                                      Height: 70 in  Age: 89 yrs                                                                        Weight: 310 lb  Reason For Study: chest pain                                                       BSA: 2.5 m2    Ordering Physician: Driscilla Grammes SUNG  Referring Physician: Driscilla Grammes SUNG  Performed By: Freddi Che    Procedure  A complete two-dimensional transthoracic echocardiogram was performed (2D, M-mode, spectral and  color flow Doppler). ([id number]). A two-dimensional transthoracic echocardiogram with color flow  Doppler was performed. ([id number], [id number]).    MMode/2D Measurements & Calculations  IVSd: 1.1 cm                                        LVIDd: 3.9 cm

## 2022-03-26 NOTE — Discharge Summary (Signed)
Discharge Summary Less than 48 Hours/Final Progress Note    (ONLY USE THIS FORM FOR HOSPITALIZATIONS UNDER 48 HOURS)    Patient:   [name] Cook   Medical record number: [id number]   Date of admission:  03/23/2022    Final diagnoses (List principal and secondary diagnoses):  Chest pain (non-cardiac)    Secondary Diagnoses  Hypertension  CVA  Hyperlipidemia  Seizure disorder  Obesity    47 year old female with history as listed above presented to the emergency department with midsternal chest pain.  With pain radiating down her left arm associated with some dizziness.  Troponins remain normal.  D-dimer was normal.  Ultrasound negative for DVT.  Echocardiogram shows EF 50 to 55%.  Cardiology consulted and suggesting that pain is most likely noncardiac in nature.  Pain regimen for atypical chest pain.  They have signed off.  Patient medically stable for discharge home.      Procedures done during hospitalization (List principal and secondary procedures):     Imaging/non-operative procedures:    CAR Echo 2D Adult Complete    Result Date: 03/25/2022                                                                    [facility]                                                                           [place], [place] [address]                                     Adult Echocardiogram Report Name: [name] Cook Date: 03/25/2022 11:16 AM                     BP: 110/63 mmHg MRN: [id number]                  Patient Location: CAP-2NM^2N302^2N-302-1^PMS        HR: 70 DOB: 1975-12-31                Gender: Female                                      Height: 70 in Age: 89 yrs  Weight: 310 lb Reason For Study: chest pain                                                       BSA: 2.5 m2 Ordering Physician: Driscilla Grammes SUNG Referring Physician: Driscilla Grammes  SUNG Performed By: Freddi Che Procedure A complete two-dimensional transthoracic echocardiogram was performed (2D, M-mode, spectral and color flow Doppler). ([id number]). A two-dimensional transthoracic echocardiogram with color flow Doppler was performed. ([id number], [id number]). MMode/2D Measurements & Calculations IVSd: 1.1 cm                                        LVIDd: 3.9 cm                                                     LVIDs: 2.8 cm                                                     LVPWd: 1.3 cm           ______________________________________________________________________________ Ao root diam: 3.4 cm                                LVOT diam: 2.0 cm LA dimension: 3.4 cm                                LVOT area: 3.3 cm2           ______________________________________________________________________________ TAPSE_phl: 2.1 cm Doppler Measurements & Calculations MV E max vel: 50.6 cm/sec                     MV V2 max: 61.7 cm/sec MV A max vel: 55.7 cm/sec                     MV max PG: 2.0 mmHg MV E/A: 0.91                                  MV V2 mean: 42.8 cm/sec                                               MV mean PG: 0.77 mmHg                                               MV V2 VTI: 16.7 cm  MVA(VTI): 3.6 cm2           ______________________________________________________________________________ MV dec time: 0.24 sec                         Med Peak E' Vel: 6.1 cm/sec                                               Medial E/e': 8.3           ______________________________________________________________________________ Lat Peak E' Vel: 8.1 cm/sec                   AoV Vmax: 128.0 cm/sec Lat E/e': 6.3                                 Ao max PG: 7.0 mmHg                                               AoV mean gradient: 86.9 cm/sec                                               Ao mean PG: 4.0 mmHg                                               Ao V2 VTI: 26.1  cm                                               AVA(I,A): 2.3 cm2                                               AVA(I,D): 2.3 cm2                                               AVA(V,D): 2.1 cm2           ______________________________________________________________________________ LVOT max PG: 3.0 mmHg                         AV VR_phl: 0.66 LV V1 mean PG: 1.0 mmHg                       AVA(VTI)/BSA_phl: 0.87 LVOT Vmax: 84.0 cm/sec LV V1 VTI: 18.2 cm           ______________________________________________________________________________ RV S Vel_phl: 12.5 cm/sec                     E'/A' Lateral: 1.4 Indications  Echo was performed for suspected Chest Pain. Left Ventricle The left ventricle is normal in size. Mild left ventricular hypertrophy. Ejection Fraction = 50- 55%. Left ventricular systolic function is normal. The left ventricular wall motion is normal. Right Ventricle The right ventricle is normal size. The right ventricular systolic function is normal. Atria The left atrial size and volume are normal. Right atrial size is normal. Mitral Valve Mitral valve structure is normal. There is trace mitral regurgitation. No mitral valve stenosis. Tricuspid Valve Structurally normal tricuspid valve. There is trace tricuspid regurgitation. Right ventricular systolic pressure is normal. Aortic Valve The aortic valve appears structurally normal. The aortic valve is trileaflet. No aortic regurgitation. There is no aortic stenosis. Pulmonic Valve The pulmonic valve is normal. Trace pulmonic valvular regurgitation. Great Vessels The aortic root is normal in size. IVC The inferior vena cava is normal in size, and collapses normally with respiration. Pericardium There is no significant pericardial effusion. Interpretation Summary The left ventricle is normal in size. Mild left ventricular hypertrophy. Ejection Fraction = 50-55%. Left ventricular systolic function is normal. The left ventricular wall motion is normal. The  right ventricle is normal size. The right ventricular systolic function is normal. ______________________________________________________________________________ Electronically signed [id number] Lisette Grinder, MD   on  03/25/2022 02:09 PM Ordering Physician: Driscilla Grammes SUNG Referring Physician: Driscilla Grammes SUNG Performed By: Freddi Che     US Venous Dplx LE Bilat Complete    Result Date: 03/25/2022  US VENOUS DPLX LE BILAT COMPLETE HISTORY: R07.9;Chest pain, unspecified  b/l LE edema with b/l calf tenderness, chest pain, shortness of breath. TECHNIQUE: Grayscale and duplex ultrasound of the deep veins of both lower extremities. Real time gray scale ultrasonography, color and duplex Doppler imaging of the bilateral lower extremity deep veins was performed from the common femoral vein, through the saphenofemoral  junction, to the popliteal vein.  The calf veins were also imaged. COMPARISON: none FINDINGS: There is normal flow and compressibility of the common femoral, femoral, popliteal and greater saphenous veins bilaterally.  There is normal flow within the deep femoral veins bilaterally.  Normal flow augmentation with calf compression is demonstrated bilaterally.  The visualized segments of the posterior tibial and peroneal veins are patent with normal blood flow and compressibility.     IMPRESSION: No evidence of femoral-popliteal deep venous thrombosis in either lower extremity.           Operative procedures:  * No surgery found *        Outcome (condition at discharge):  Stable      Disposition:  Home     Follow up:  To Be Determined Tbd  None specified    Schedule an appointment as soon as possible for a visit  Cardiology team suggesting pain is NON-CARDIAC. No indication for any further testing. Can follow-up with PCP

## 2022-04-04 NOTE — ED Provider Notes (Signed)
Patient Evaluated: 04/04/2022 - 21:35 - [facility] ED 09    Means of arrival:Car        CC / HPI                                                                                                                                                                           Chief Complaint from Triage Note:   "Patient presents with:  Pain, Rib  Left rib pain and "chest discomfort".  Reproducable.  "Hurts to lift arm" started yesterday."    Limitations to History:  None    HPI:  [name] Cook is a 47 y.o. female who presents with Patient with a history of multiple visitations to hospitals with chest pain syndrome (all with negative cardiac workups), presents with sudden onset of anterior central and lower anterior lateral left chest pain 3 days ago.  Patient states the pain is nonexertional has been persistently present throughout the last 3 days.  There is no shortness of breath.  She denies dyspnea palpitations.  Pain is dramatically worsened with palpitation in the affected areas as well as turning of the torso or deep inspiration.  There is no arm or leg pain or swelling.  There is no recent travel.      Pain, Chest  Pain location:  L lateral chest  Pain quality: sharp    Pain severity:  Severe  Onset quality:  Sudden  Duration:  3 days  Timing:  Constant  Progression:  Unchanged  Chronicity: Unclear.  Context: at rest    Context: not trauma    Relieved by:  None tried  Worsened by:  Deep breathing and movement (Palpitation)  Associated symptoms: no abdominal pain, no back pain, no cough, no diaphoresis, no dizziness, no fatigue, no fever, no headache, no lower extremity edema, no nausea, no near-syncope, no numbness, no palpitations, no shortness of breath, no syncope, no vomiting and no weakness         Independent Historian(s):       External Records Reviewed:   I have reviewed available external records, including CRISP entries.     Relevant Past Medical, Social and Family History - See resident note (if applicable) for  additional historical information   Past Medical / Surgical History:  has a past medical history of Cancer (CMS/HCC), Cervical cancer (CMS/HCC), CVA (cerebral vascular accident) (CMS/HCC), HLD (hyperlipidemia), HTN (hypertension), Hypertension, Morbid obesity with BMI of 40.0-44.9, adult (CMS/HCC), Seizure disorder (CMS/HCC), Seizures (CMS/HCC), and TIA (transient ischemic attack).  has a past surgical history that includes hysterectomy abdominal (2021); hx hysterectomy; esophagogastroduodenoscopy (Bilateral, 05/17/2020); enterscopy (N/A, 05/17/2020); sling pubovaginal (N/A, 06/08/2020); colporrhaphy (N/A, 06/08/2020); perineoplasty (N/A, 06/08/2020); hx appendectomy (01/29/18); removal / revision of sling  for stress incontinence (N/A, 06/22/2020); cystoscopy (N/A, 06/22/2020); cystoscopy biopsy bladder (N/A, 06/22/2020); and cystoscopy (N/A, 08/04/2020).  Family History: family history includes Breast Cancer in her sister; Cervical Cancer in her sister; Diabetes mellitus in her father and mother; Hypertension in her father and mother.  Social Hx:  reports that she has never smoked. She has never used smokeless tobacco. She reports that she does not currently use alcohol. She reports that she does not use drugs.   Current Outpatient Medications on File Prior to Encounter   Medication Last Dose   . amLODIPine (NORVASC) 5 MG tablet    . aspirin 325 MG EC tablet    . atorvastatin (LIPITOR) 40 MG tablet    . hydrALAZINE (APRESOLINE) 25 MG tablet    . hydroCHLOROthiazide 25 MG tablet    . levETIRAcetam (KEPPRA) 500 MG tablet    . lisinopril (ZESTRIL) 10 MG tablet    . ondansetron (ZOFRAN) 4 mg tablet      ALLERGIES: Acetaminophen, Butalbital-apap-caffeine, Cyclobenzaprine, Hydrocodone-acetaminophen, Ibuprofen, Iodinated Contrast - Iv Or Oral, Iodinated Contrast Media, Ketorolac Tromethamine, Nsaids, Oxycodone-acetaminophen, Shellfish-derived Products, Tramadol, Ceftriaxone, Ciprofloxacin, Dicyclomine, Eggs Or Egg-derived Products,  Metoclopramide, Promethazine      Review of Systems - See HPI and resident note (if applicable)  for additional ROS Information   No LMP recorded (lmp unknown). Patient has had a hysterectomy.  Review of Systems   Constitutional:  Negative for chills, diaphoresis, fatigue and fever.   HENT:  Negative for ear pain, sinus pressure, sinus pain, sore throat and voice change.    Eyes:  Negative for pain and redness.   Respiratory:  Negative for cough and shortness of breath.    Cardiovascular:  Positive for chest pain. Negative for palpitations, syncope and near-syncope.   Gastrointestinal:  Negative for abdominal pain, diarrhea, nausea and vomiting.   Endocrine: Negative for cold intolerance and heat intolerance.   Genitourinary:  Negative for dysuria, frequency, pelvic pain, vaginal bleeding and vaginal discharge.   Musculoskeletal:  Negative for arthralgias, back pain and myalgias.   Skin:  Negative for rash.   Allergic/Immunologic: Negative for immunocompromised state.   Neurological:  Negative for dizziness, speech difficulty, weakness, light-headedness, numbness and headaches.   Hematological:  Negative for adenopathy.   Psychiatric/Behavioral:  Negative for agitation, confusion, hallucinations and suicidal ideas.    All other systems reviewed and are negative.        Physical Exam     LAST  VS BP: (!) 140/75 Pulse: 79 (04/05/22 0400)  Heart Rate (Monitored): 79 (04/05/22 0400) Resp: 18  SpO2: 99 % Temp: 36.4 C (97.5 F)   FIRST  VS BP: (!) 165/106 Pulse: 70 (04/04/22 2039)  Heart Rate (Monitored): 64 (04/05/22 0100) Resp: 18  SpO2: 98 % Temp: 36.3 C (97.4 F)   SHOCK INDEX BASED ON LATEST VITAL SIGNS (PULSE/SBP): .56  **These vitals reflect the first/last taken during this encounter. Within each group, they are not necessarily taken at the same time.    Physical Exam  Vitals and nursing note reviewed.   Constitutional:       General: She is not in acute distress.     Appearance: Normal appearance. She is not  ill-appearing, toxic-appearing or diaphoretic.   HENT:      Head: Normocephalic and atraumatic.      Right Ear: External ear normal.      Left Ear: External ear normal.      Nose: Nose normal.      Mouth/Throat:  Mouth: Mucous membranes are moist.   Eyes:      Extraocular Movements: Extraocular movements intact.      Conjunctiva/sclera: Conjunctivae normal.      Pupils: Pupils are equal, round, and reactive to light.   Cardiovascular:      Rate and Rhythm: Normal rate and regular rhythm.   Pulmonary:      Effort: Pulmonary effort is normal. No respiratory distress.      Breath sounds: Normal breath sounds. No stridor. No wheezing.   Chest:          Comments: Area of pain complaint indicated on diagram.  Pain is completely reproduced with palpation the affected areas.  Abdominal:      General: Abdomen is flat. Bowel sounds are normal.      Palpations: Abdomen is soft. There is no mass or pulsatile mass.      Tenderness: There is no abdominal tenderness.   Musculoskeletal:      Cervical back: Normal, normal range of motion and neck supple.      Thoracic back: Normal.      Lumbar back: Normal.      Comments: Upper extremity ROM normal. Inspection normal.  Lower extremity ROM normal. Inspection normal   Lymphadenopathy:      Comments: Lymphatic exam normal   Skin:     General: Skin is warm and dry.      Coloration: Skin is not ashen, cyanotic, jaundiced, mottled, pale or sallow.      Findings: No rash.   Neurological:      Mental Status: She is alert and oriented to person, place, and time.      GCS: GCS eye subscore is 4. GCS verbal subscore is 5. GCS motor subscore is 6.      Cranial Nerves: No cranial nerve deficit.      Sensory: Sensation is intact. No sensory deficit.      Motor: Motor function is intact.      Coordination: Coordination is intact.      Gait: Gait is intact.   Psychiatric:         Attention and Perception: Attention and perception normal.         Mood and Affect: Mood and affect normal.          Speech: Speech normal.         Behavior: Behavior is cooperative.         Thought Content: Thought content normal.         Cognition and Memory: Cognition and memory normal.         Judgment: Judgment normal.          Diagnostic Studies        Results for orders placed or performed during the hospital encounter of 04/04/22   D Dimer   Result Value Ref Range    D Dimer Quant 0.56 (H) 0.27 - 0.50 mcg/mL FEU   EKG 12 Lead PAIN, RIB   Result Value Ref Range    Ventricular Rate 68 BPM    Atrial Rate 68 BPM    P-R Interval 150 ms    QRS Duration 86 ms    Q-T Interval 454 ms    QTC Calculation (Bazett) 482 ms    P Axis 51 degrees    R Axis 32 degrees    T Axis 13 degrees          No results found.  I independently reviewed the above radiology studies and when  relevant, compared with previous studies if available.  EKG: 12 Lead ECG: , -- RATE: 68, -- RHYTHM:, normal sinus rhythm, --COMPARISON: , --INTERPRETATIONS:, Occasional PVCs .   Independent Interpretation of Imaging Studies:  Chest x-ray, 2 view: NAD        Progress Notes, Medical Decision Making and Critical Care    Differential:  Differential diagnosis includes but is not limited to pleuritis, slipping rib syndrome, chest wall pain, infectious process, pulmonary embolism.    Assessment / Plan / Course of Care: We will perform serum testing and radiographic studies    ED Course as of 04/06/22 1451   Tue Apr 04, 2022   2236 Chart review: Today's EKG is compared to multiple EKGs over the last year.  There is essentially no change compared to previous EKGs. [KM]   2312 Patient asking for morphine or Dilaudid for pain.  She has an extensive allergic history for multiple narcotics and all nonsteroidals but states that she can take morphine or Dilaudid without any difficulty.  Reviewed most recent mission shows that she was getting significant doses of both morphine and Dilaudid for chest pain syndrome (note this was 1 of multiple evaluations over the course of the last 1  to 2 years that were negative for any specific etiology). [KM]   2315 Patient with positive D-dimer.  I spoke to her about her contrast allergy.  Patient states that she received it at [facility] 2 years ago and ended up intubated due to a profound anaphylactic reaction. [KM]   2324 Discussed with Dr. Manson Passey, [facility].  He is able to take her in transfer for the VQ scan. [KM]   Wed Apr 05, 2022   0408 Signout received from Dr. Alecia Lemming at 0100 hrs.  Patient continues to be hemodynamically unchanged.  Reports return of chest pain.  Will redosed with morphine.  Awaiting transport. [TO]   [id number] Transport team arrived for patient.  Hemodynamically unchanged at time of transfer. [TO]      ED Course User Index  [KM] Dionicio Stall, MD  [TO] Scharlene Corn, MD         Discussion of Management with other Physicians, QHP or Appropriate Source:  N/A              Clinical Impression and Disposition    Diagnosis:  1. Chest pain, unspecified type    2. Chest wall pain    3. Primary hypertension      ED Disposition: Transfer - 04/04/2022 23:23  Condition at time of disposition: Good, Stable                    Dionicio Stall, MD  04/06/22 1452

## 2022-04-05 NOTE — ED Provider Notes (Addendum)
ED Assumption of Care Note  Created:04/05/2022 - 04:08 - [facility] ED 09     Diagnostic Studies   Monitor: Heart Rate (Monitored): 79,     Na     K+     Cl     CO2     A-Gap     BUN     Cr     Glu     Glu(P)     Ca     Mg     Phos     AST     ALT     AlkP     TBili     Prot     Alb     Lipase      WBC     Hb     HCT     PLT     MCV     NEU%     NEU     LYM%     LYM     EOS%     EOS     IMMG%     IMMG     BANDS      PT     APTT     INR     DDIMER  0.56*    U APP     U GLU     U BILI     U KET     U BLD     U pH     U PRO     URO B     U NIT     U LE     U SPG     U WBC     U RBC     U BACT     U SQUAM                    03/26/22  0714 03/25/22  1042 03/24/22  0650 03/23/22  2058 12/01/21  1042   CREATININE 0.9 0.8 0.8 0.8  0.9 0.7         XR Chest 2 Views    Result Date: 04/04/2022  HISTORY: Chest pain EXAM: PA and lateral chest radiograph. COMPARISON: None. FINDINGS: The lungs are well-inflated and clear. No focal consolidation or signs of pulmonary edema. No pleural effusions or pneumothorax. The cardiomediastinal silhouette is unremarkable. The osseous structures and soft tissues of the chest wall demonstrate no acute abnormality.     IMPRESSION: No acute cardiopulmonary process.    I independently reviewed the above radiology studies and when relevant, compared with previous studies if available.      Medications   Medications administered in the ED   Lidocaine 4 % patch 1 patch (1 patch Transdermal Patch Applied 04/04/22 2210)   amLODIPine (NORVASC) tablet 10 mg (has no administration in time range)   morphine (PF) 4 mg/mL injection 4 mg (4 mg Intravenous Given 04/04/22 2333)   morphine (PF) 4 mg/mL injection 4 mg (4 mg Intravenous Given 04/05/22 0415)            Handoff Notes, Progress Notes, Medical Decision Making and Critical Care   Handoff note from previous shift:       LAST  VS BP: (!) 140/75 Pulse: 79 (04/05/22 0400)  Heart Rate (Monitored): 79 (04/05/22 0400) Resp: 18  SpO2: 99 % Temp: 36.4 C (97.5 F)    FIRST  VS BP: (!) 165/106 Pulse: 70 (04/04/22 2039)  Heart Rate (Monitored): 64 (04/05/22 0100) Resp: 18  SpO2: 98 % Temp: 36.3 C (97.4 F)   SHOCK INDEX BASED ON LATEST VITAL SIGNS (PULSE/SBP): .56  **These vitals reflect the first/last taken during this encounter. Within each group, they are not necessarily taken at the same time.    ED Course as of 04/05/22 0452   Tue Apr 04, 2022   2236 Chart review: Today's EKG is compared to multiple EKGs over the last year.  There is essentially no change compared to previous EKGs. [KM]   2312 Patient asking for morphine or Dilaudid for pain.  She has an extensive allergic history for multiple narcotics and all nonsteroidals but states that she can take morphine or Dilaudid without any difficulty.  Reviewed most recent mission shows that she was getting significant doses of both morphine and Dilaudid for chest pain syndrome (note this was 1 of multiple evaluations over the course of the last 1 to 2 years that were negative for any specific etiology). [KM]   2315 Patient with positive D-dimer.  I spoke to her about her contrast allergy.  Patient states that she received it at [facility] 2 years ago and ended up intubated due to a profound anaphylactic reaction. [KM]   2324 Discussed with Dr. Manson Passey, ED attending [facility].  He is able to take her in transfer for the VQ scan. [KM]   Wed Apr 05, 2022   0408 Signout received from Dr. Alecia Lemming at 0100 hrs.  Patient continues to be hemodynamically unchanged.  Reports return of chest pain.  Will redosed with morphine.  Awaiting transport. [TO]   [id number] Transport team arrived for patient.  Hemodynamically unchanged at time of transfer. [TO]      ED Course User Index  [KM] Dionicio Stall, MD  [TO] Scharlene Corn, MD         I reviewed recent and relevant Chart Entries, Previous Notes, Previous Labs, Previous Radiology studies          Clinical Impression and Disposition (.eddispoinstructions)   1. Chest pain,  unspecified type    2. Chest wall pain    3. Primary hypertension        ED Disposition: Transfer - 04/04/2022 23:23  Condition at time of disposition: Good             Scharlene Corn, MD  04/05/22 0981       Scharlene Corn, MD  04/05/22 [phone]

## 2022-04-09 NOTE — ED Provider Notes (Signed)
Date of Service: 04/09/2022    PCP: Tivis Ringer CRNP, CRNP   HISTORY OF PRESENT ILLNESS   I have introduced myself and discussed my role in the patient's care with patient/family/caregiver.    Chief Complaint   Patient presents with   . Abnormal Lab     [name] Cook is a 47 y.o. year old female with PMHx HTN, HLD, cervical cancer s/p hysterectomy who presents to the ED with complaint of chest pain and shortness of breath.  Onset 2 days ago.  Chest pain described as generalized and sharp.  Occurs intermittently.  Has not taken any medications for symptoms.  Denies any history of similar symptoms.  Denies URI symptoms.    Patient seen at [facility] today and found to have an elevated D-dimer so transferred to this facility for VQ scan as patient has anaphylactic reaction to IV contrast          REVIEW OF SYSTEMS    ROS reviewed and negative, except as stated in HPI or elsewhere in the chart.  Review of Systems   Constitutional: Negative for chills and fever.   HENT: Negative for congestion, rhinorrhea and sore throat.    Respiratory: Positive for shortness of breath. Negative for cough and wheezing.    Cardiovascular: Positive for chest pain. Negative for leg swelling.   All other systems reviewed and are negative.        PAST HISTORY       Past Medical History:   Diagnosis Date   . Cancer (HCC)    . Cerebral artery occlusion with cerebral infarction (HCC)    . Diabetes mellitus (CMS/HCC)    . GERD (gastroesophageal reflux disease)    . High cholesterol    . Hypertension    . Seizure (HCC)    . Seizure (HCC)    . TIA (transient ischemic attack)      Past Surgical History:   Procedure Laterality Date   . CHOLECYSTECTOMY  2019    [facility]   . Radical HYSTERECTOMY, TOTAL, ABDOMINAL, WITH BILATERAL SALPINGO-OOPHORECTOMY, OMENTECTOMY N/A 05/23/2019    Performed by Jeri Lager, MD at [facility] OR   . DRAINAGE, ABSCESS, PERCUTANEOUS, WITH IMAGING GUIDANCE Other (See Comments) 07/11/2019    Performed by  Reuben Likes, MD at [facility] IR   . Abdominal wound washout N/A 09/10/2019    Performed by Frederik Schmidt, MD at [facility] OR   . ABDOMEN SURGERY     . APPENDECTOMY     . HYSTERECTOMY       Social History     Tobacco Use   . Smoking status: Never   . Smokeless tobacco: Never   Vaping Use   . Vaping Use: Never used   Substance Use Topics   . Alcohol use: No   . Drug use: No     Family History   Problem Relation Age of Onset   . Breast Cancer Sister    . Breast Cancer Paternal Aunt    . High Blood Pressure Mother    . High Blood Pressure Father    . Diabetes Father          The patient's home medications section has been reviewed.  Prior to Admission medications    Medication Sig Start Date End Date Taking? Authorizing Provider   amLODIPine (NORVASC) 5 MG tablet Take 1 Tablet (5 mg total) by mouth daily 10/18/19  Yes INTERFACE, GENERIC   hydroCHLOROthiazide (HYDRODIURIL) 25 MG tablet Take 1 Tablet (  25 mg total) by mouth 01/23/20  Yes INTERFACE, GENERIC   lisinopril (PRINIVIL;ZESTRIL) 10 MG tablet 1 Tablet (10 mg total)   Yes INTERFACE, GENERIC   atorvastatin (LIPITOR) 80 MG tablet Take 1 Tablet (80 mg total) by mouth 03/11/19  Yes Historical Provider, MD   hydrALAZINE (APRESOLINE) 10 MG tablet Take 2 Tablets (20 mg total) by mouth daily   Yes Historical Provider, MD   levETIRAcetam (KEPPRA) 500 MG tablet Take 1 Tablet (500 mg total) by mouth 2 times daily   Yes Historical Provider, MD   atorvastatin (LIPITOR) 40 MG tablet Take 40 mg by mouth 04/26/20   INTERFACE, GENERIC   HYDROmorphone (DILAUDID) 4 MG tablet 4 mg 06/23/19   INTERFACE, GENERIC   spironolactone (ALDACTONE) 25 MG tablet  01/23/20   INTERFACE, GENERIC       Allergies   Allergen Reactions   . Acetaminophen Swelling, Anaphylaxis, Hives (Adulthood), Itching and Rash (Mucositis)     Other reaction(s): Unknown  Other reaction(s): itchy rash on arm     . Butalbital-Apap-Caffeine Hives (Adulthood)   . Cyclobenzaprine Swelling and Hives (Adulthood)     Causes throat swelling per  Pt report  Causes throat swelling per Pt report     . Ibuprofen Hives (Adulthood), Rash (Mucositis) and Swelling     Other reaction(s): Unknown  Other reaction(s): Unknown     . Iodinated Contrast Media Anaphylaxis   . Iodine Rash (Mucositis), Swelling, Anaphylaxis and Laryngeal Edema (Throat swelling)   . Nsaids Hives and Anaphylaxis   . Oxycodone-Acetaminophen Anaphylaxis, Hives, Swelling and Hives (Adulthood)   . Promethazine Anaphylaxis   . Shellfish-Derived Products Anaphylaxis   . Toradol [Ketorolac Tromethamine] Swelling   . Tramadol Hives, Anaphylaxis, Hives (Adulthood), Respiratory Distress, Swelling and Unknown to Patient//Family     Pt states makes sick, nausea and vomiting  Pt states makes sick, nausea and vomiting  hives  Pt states makes sick, nausea and vomiting  Pt states makes sick, nausea and vomiting     . Acetaminophen-Codeine Swelling   . Ceftriaxone Angioedema (Severe generalized swelling - face/hand/neck)     Tongue itching  Tongue itching  Tongue itching  Tongue itching     . Ciprofloxacin Hives (Adulthood)   . Dicyclomine Swelling     Other reaction(s): Edema, Edema     . Eggs Or Egg-Derived Products Swelling   . Hydralazine Swelling   . Hydrocodone-Acetaminophen Swelling     HIVES   . Metoclopramide Itching and Hives (Adulthood)   . Red Dye Swelling   . Tilactase Diarrhea         PHYSICAL EXAM      Vital Signs: I have reviewed the patient's vital signs  Vitals:    04/05/22 0553 04/05/22 0823 04/05/22 1441   BP: (!) 164/100 (!) 148/85 129/76   Patient Position:  Supine Semi Fowlers   Pulse: 70 63 (!) 58   Resp: 18 17 16    Temp: 97.4 F (36.3 C) 97.7 F (36.5 C) 97.8 F (36.6 C)   TempSrc: Oral Oral Oral   SpO2:  99% 95%   LMP: 04/14/2019       Physical Exam  Vitals and nursing note reviewed.   Constitutional:       General: She is not in acute distress.     Appearance: She is obese.   HENT:      Head: Normocephalic and atraumatic.      Left Ear: External ear normal.      Nose: Nose  normal.       Mouth/Throat:      Mouth: Mucous membranes are moist.   Eyes:      Extraocular Movements: Extraocular movements intact.      Pupils: Pupils are equal, round, and reactive to light.   Cardiovascular:      Rate and Rhythm: Normal rate and regular rhythm.      Pulses: Normal pulses.      Heart sounds: Normal heart sounds.   Pulmonary:      Effort: Pulmonary effort is normal. No respiratory distress.      Breath sounds: Normal breath sounds.   Abdominal:      General: There is no distension.      Palpations: Abdomen is soft.      Tenderness: There is no abdominal tenderness. There is no guarding or rebound.   Musculoskeletal:      Right lower leg: No edema.      Left lower leg: No edema.   Skin:     General: Skin is warm.   Neurological:      General: No focal deficit present.      Mental Status: She is alert and oriented to person, place, and time.      Cranial Nerves: No cranial nerve deficit.           PROCEDURES        EKG 12 lead    Performed by: Marc Morgans, MD  Authorized by: Marc Morgans, MD    ECG interpreted by ED Physician in the absence of a cardiologist: yes    Rate:     ECG rate:  66    ECG rate assessment: normal    Rhythm:     Rhythm: sinus rhythm    Ectopy:     Ectopy: PVCs    QRS:     QRS axis:  Normal    QRS intervals:  Normal    QRS conduction: normal    ST segments:     ST segments:  Normal  T waves:     T waves: normal             ED Orders/Results/Medications          Labs ordered and Reviewed:  Results for orders placed or performed during the hospital encounter of 04/05/22   SARS-CoV-2/Flu A+B/RSV PCR (4 [facility])   Result Value Ref Range    SARS-CoV-2 RT-PCR Negative Negative    Influenza A PCR Negative Negative    Influenza B PCR Negative Negative    RSV PCR Negative Negative   CBC and differential   Result Value Ref Range    WBC 8.65 4.30 - 10.50 10*3/L    RBC 4.49 4.10 - 5.30 10*6/L    Hemoglobin 13.6 12.0 - 16.0 g/dL    Hematocrit 44.0 34.7 - 47.0 %    MCV 95.3 80.0 - 100.0 fL    MCH 30.3  26.0 - 33.0 pg    MCHC 31.8 31.0 - 34.0 g/dL    RDW 42.5 95.6 - 38.7 %    Platelets 149 140 - 450 10*3/L    MPV 13.4 (H)   fL    NRBC Absolute 0.00 0.00 - 0.00 10*3/L    NRBC % 0.0 0.0 - 0.0 %    Instrument Neutrophils Absolute 5.14 2.00 - 7.80 10*3/L    Neutrophils Absolute 5.14 2.00 - 7.80 10*3/L    Lymphocytes Absolute 2.64 1.00 - 4.80 10*3/L    Monocytes Absolute 0.61 0.10 -  1.40 10*3/L    Eosinophils Absolute 0.19 0.00 - 0.45 10*3/L    Basophils Absolute 0.05 0.00 - 0.20 10*3/L    Immature Granulocytes Absolute 0.02   10*3/L    Neutrophils Relative 59.4 40.0 - 80.0 %    Lymphocytes Relative 30.5 10.0 - 49.0 %    Monocytes Relative 7.1 1.0 - 12.0 %    Eosinophils Relative 2.2 0.0 - 7.0 %    Basophils Relative 0.6 0.0 - 2.0 %    Immature Granulocytes Percent 0.2 0.0 - 0.8 %   Comprehensive metabolic panel   Result Value Ref Range    Sodium 140 135 - 146 mmol/L    Potassium 3.7 3.5 - 5.2 mmol/L    Chloride 106 95 - 112 mmol/L    CO2 26 18 - 32 mmol/L    Anion Gap 8 3 - 20 mmol/L    Glucose, Bld 117 65 - 140 mg/dL    BUN 17 5 - 25 mg/dL    Creatinine 0.8 0.5 - 1.1 mg/dL    Calcium 8.7 8.4 - 16.1 mg/dL    Total Protein 7.9 6.4 - 8.2 g/dL    Albumin 3.6 3.2 - 5.0 g/dL    Globulin 4.3 (H) 1.8 - 3.5 g/dL    Albumin/Globulin Ratio 0.8 (L) 1.1 - 2.6 %    Alkaline Phosphatase 116 32 - 132 IU/L    ALT 22 6 - 46 IU/L    AST 17 10 - 40 U/L    Bilirubin, Total 0.8 0.3 - 1.3 mg/dL    Glom Filt Rate, Est Refit >60 >60 mL/min/1.59m   Troponin High Sensitivity   Result Value Ref Range    Troponin, High Sensitivity <3 <14 ng/L   Troponin High Sensitivity   Result Value Ref Range    Troponin, High Sensitivity <3 <14 ng/L   Brain natriuretic peptide   Result Value Ref Range    NT proBNP 41 See below pg/mL   HCG, quantitative, pregnancy   Result Value Ref Range    hCG Quant <2 <5 mIU/mL mIU/mL       Imaging ordered and Reviewed:  NM LUNG SCAN PERFUSION PARTICULATE   Final Result by Burton Apley, MD (01/24 1007)        PROCEDURE: NM LUNG SCAN PERFUSION PARTICULATE          INDICATION:  sob        COMPARISON: None.        TECHNIQUE: After obtaining the patient's consent, a lung perfusion scan    was obtained in the usual manner with SPECT imaging, utilizing Tc-56m MAA    IV.      Radiopharmaceutical: 3.3Tc-16m MAA         FINDINGS:       PERFUSION: Normal homogeneous isotope distribution bilaterally with no    focal defects.          CONCLUSION: Normal examination.            Signed by: Burton Apley on  04/05/2022 10:07 AM                      See ED course for provider interpretation and management.    Patient received the following medications in the Emergency department:  Orders Placed This Encounter   Medications   . morphine injection 4 mg   . morphine injection 4 mg   . ondansetron (ZOFRAN) injection 4 mg   . sodium chloride 0.9 % bolus 1,000  mL   . ondansetron (ZOFRAN-ODT) 4 MG disintegrating tablet         Medical Decision Making/DDx/Assessment&Plan       Differential diagnosis for this patient includes but is not limited to: Pneumonia, PE, pericarditis/myocarditis, ACS, arrhythmia, and others      Amount and/or Complexity of Data Reviewed:  Independent History was obtained from the following  and can be found in the HPI and/or ED COURSE: EMS  I reviewed relevant medical records  from the following  records and summary be found in the ED COURSE and/or HPI: Current and past Epic records  Studies reviewed and independently interpreted by me and can be found in the ED COURSE: EKG(s)  Patients care was discussed the following professionals and discussion result can be found in the ED COURSE: none      Please see the ED COURSE for any further additions to the patient's care and management as that is where I document discussions with consultants/family/nursing, my independent review of records/imaging/ECG's, and/or clinical reasoning that is otherwise not included in this section.         ED Course/Consults/Interpretations/Scoring  tools:  ED Course as of 04/09/22 1732   Wed Apr 05, 2022   0911 46yo female with PMHx HTN, HLD, cervical cancer s/p hysterectomy who presents to the ED with complaint of chest pain and shortness of breath.  Onset 2 days ago.  Chest pain described as generalized and sharp.  Occurs intermittently.  Has not taken any medications for symptoms.  Denies any history of similar symptoms.  Denies URI symptoms.    Patient seen at [facility] today and found to have an elevated D-dimer so transferred to this facility for VQ scan as patient has anaphylactic reaction to IV contrast [NA]   1044 NM LUNG SCAN PERFUSION PARTICULATE   CONCLUSION: Normal examination. [NA]   1303 Result Date: 04/04/2022  HISTORY: Chest pain EXAM: PA and lateral chest radiograph. COMPARISON: None. FINDINGS: The lungs are well-inflated and clear. No focal consolidation or signs of pulmonary edema. No pleural effusions or pneumothorax. The cardiomediastinal silhouette is unremarkable. The osseous structures and soft tissues of the chest wall demonstrate no acute abnormality.     IMPRESSION: No acute cardiopulmonary process.    [NA]   1305 ED workup unremarkable for any acute process.  Patient is satting well without oxygen requirement.  Results of workup discussed with patient.  Follow-up and return precautions discussed at length.  Plan to discharge home. [NA]   1445 Per nurse, patient stated that she is having nausea and emesis (not witnessed).  Zofran and fluids ordered.  Will reassess prior to discharge. [NA]       Scoring Tools:  Heart Score: 3  Risk: Low 0 - 3; Intermediate 4 - 6; High 7 or greater              I utilized an evidence-based risk rating tool (CMT) along with my training and experience to weigh the risk of discharge against the risks of further testing, imaging, or hospitalization. At this time I estimate the risks of additional testing, imaging, or hospitalization to be equal to or greater than the risk of discharge. I discussed my risk  assessment with the patient and the patient consents to the risk of discharge as well as the risk of uncertainty in estimating outcomes.    The patient's HEART Score is <4. In rare cases, I give patients with HEART Score of 4 the option of discharge, but only  when they meet criteria for "Low 4," meaning that HST was used, and the 4 is not from a highly suspicious story, highly suspicious EKG, or positive cardiac enzymes. In these selected cases, the risk of a "Low 4" is still most likely lower than the risk of admission and further testing/imaging. [id number]       Disposition   Clinical Impressions:    SNOMED CT(R)   1. Chest pain, unspecified type  CHEST PAIN   2. Dyspnea, unspecified type  DYSPNEA   3. Nausea and vomiting, unspecified vomiting type  NAUSEA AND VOMITING         Medication Changes:  Patient received the following prescriptions at the time of discharge:  Discharge Medication List as of 04/05/2022  3:46 PM      START taking these medications    Details   ondansetron (ZOFRAN-ODT) 4 MG disintegrating tablet Take 1 Tablet (4 mg total) by mouth every 6 Hours as needed for up to 3 days, Disp-10 Tablet, R-0, Print             Disposition:Discharge   Condition: good  Disposition Discussion:  I reviewed pertinent lab and/or radiology findings noted above with patient. I reviewed the plan of care with the patient, and gave opportunity to answer all questions.     I am the clinician of record. Dictation used with Chemical engineer. Please excuse any errors.      Marc Morgans, MD  04/09/22 [id number]

## 2022-05-25 NOTE — ED Provider Notes (Addendum)
Patient Evaluated: 05/25/2022 - 15:14 - [facility]    Means of arrival:Car        CC / HPI                                                                                                                                                                           Chief Complaint from Triage Note:   "Patient presents with:  Pain, Chest  Lightheadedness  She was at work and started having chest pain, and lightheaded. Then she passed out and vomited x 3 today"      HPI:  [name] Cook is a 47 y.o. female who presents with chest pain.  Patient reports that she was having nausea and vomiting.  She reports that she vomited 3 times today.  She denies hematemesis, hematochezia or melena.  She reports that the last time she vomited she became dizzy and passed out.  Unsure if she hit her head.  She denies headache or neck pain.  She reports that while she was vomiting she was having some chest pain.  She denies recent travel, recent hospitalization, recent surgery she did mention DVT or PE.  Patient reports that her chest pain is similar to the chest pain she has had in the past.  She reports no new injury.      On review of outside records patient was seen in the emergency department was admitted several times for CVA and chest pain.  Patient had an unremarkable stay and was discharged home.  The last visit patient was seen for chest pain patient had a elevated D-dimer patient had a VQ scan with no pulmonary embolus.     Relevant Past Medical, Social and Family History - See resident note (if applicable) for additional historical information   Past Medical / Surgical History:   Past Medical History:   Diagnosis Date   . Cancer (CMS/HCC)    . Cervical cancer (CMS/HCC)     s/p TAH by Dr. Hillary Bow   . CVA (cerebral vascular accident) (CMS/HCC) 2015    After HTN emergency; recovered    . HLD (hyperlipidemia)    . HTN (hypertension)    . Hypertension 12/15/1992   . Morbid obesity with BMI of 40.0-44.9, adult (CMS/HCC)    . Seizure  disorder (CMS/HCC)    . 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 TIA (transient ischemic attack)       Past Surgical History:   Procedure Laterality Date   . COLPORRHAPHY N/A 06/08/2020    POSTERIOR REPAIR performed by Harvest Forest, MD at [facility] MAIN OR   . CYSTOSCOPY N/A 06/22/2020    CYSTOSCOPY performed by Harvest Forest,  MD at [facility] MAIN OR   . CYSTOSCOPY N/A 08/04/2020    SLING REMOVAL,CYSTOSCOPY performed by Harvest Forest, MD at [facility] MAIN OR   . CYSTOSCOPY BIOPSY BLADDER N/A 06/22/2020    CYSTOSCOPY BIOPSY BLADDER performed by Harvest Forest, MD at [facility] MAIN OR   . ENTERSCOPY N/A 05/17/2020    PUSH ENTEROSCOPY performed by Epifania Gore, MD at [facility] ENDO OR   . ESOPHAGOGASTRODUODENOSCOPY Bilateral 05/17/2020    ESOPHAGOGASTRODUODENOSCOPY performed by Epifania Gore, MD at [facility] ENDO OR   . HX APPENDECTOMY  01/29/18   . HX HYSTERECTOMY     . HYSTERECTOMY ABDOMINAL  2021   . PERINEOPLASTY N/A 06/08/2020    PERINEOPLASTY performed by Harvest Forest, MD at [facility] MAIN OR   . REMOVAL / REVISION OF SLING FOR STRESS INCONTINENCE N/A 06/22/2020     EXAMINATION UNDER ANESTHESIA   performed by Harvest Forest, MD at [facility] MAIN OR   . SLING PUBOVAGINAL N/A 06/08/2020    SLING VAGINAL WITH ALTIS performed by Harvest Forest, MD at [facility] MAIN OR     Family History: family history includes Breast Cancer in her sister; Cervical Cancer in her sister; Diabetes mellitus in her father and mother; Hypertension in her father and mother.  Social Hx:  reports that she has never smoked. She has never used smokeless tobacco. She reports that she does not currently use alcohol. She reports that she does not use drugs.   Current Outpatient Medications on File Prior to Encounter   Medication Last Dose   . amLODIPine (NORVASC) 5 MG tablet    . aspirin 325 MG EC tablet    . atorvastatin (LIPITOR) 40 MG tablet    . hydrALAZINE (APRESOLINE) 25  MG tablet    . hydroCHLOROthiazide 25 MG tablet    . levETIRAcetam (KEPPRA) 500 MG tablet    . lisinopril (ZESTRIL) 10 MG tablet    . ondansetron (ZOFRAN) 4 mg tablet      ALLERGIES: Acetaminophen, Butalbital-apap-caffeine, Cyclobenzaprine, Hydrocodone-acetaminophen, Ibuprofen, Iodinated Contrast - Iv Or Oral, Iodinated Contrast Media, Ketorolac Tromethamine, Nsaids, Oxycodone-acetaminophen, Shellfish-derived Products, Tramadol, Ceftriaxone, Ciprofloxacin, Dicyclomine, Eggs Or Egg-derived Products, Metoclopramide, Promethazine      Review of Systems - See HPI and resident note (if applicable)  for additional ROS Information   No LMP recorded (lmp unknown). Patient has had a hysterectomy.  Review of Systems   Constitutional:  Negative for chills and fever.   HENT:  Negative for dental problem, ear pain, hearing loss, rhinorrhea and sore throat.    Eyes:  Negative for pain and redness.   Respiratory:  Negative for apnea, cough, choking, chest tightness, shortness of breath, wheezing and stridor.    Cardiovascular:  Positive for chest pain. Negative for leg swelling.   Gastrointestinal:  Positive for nausea and vomiting. Negative for abdominal distention, abdominal pain, anal bleeding, blood in stool, constipation, diarrhea and rectal pain.   Genitourinary:  Negative for dysuria and flank pain.   Musculoskeletal:  Negative for arthralgias, back pain and neck pain.   Skin:  Negative for color change, pallor, rash and wound.   Neurological:  Negative for dizziness.   Psychiatric/Behavioral:  Negative for agitation, confusion and hallucinations.    All other systems reviewed and are negative.        Physical Exam     LAST  VS BP: (!) 149/92 Pulse: 60 (05/25/22 1700)  Heart Rate (Monitored): 70 (05/25/22 1450) Resp: 17  SpO2: 98 % Temp: 36.7 C (  98.1 F)   FIRST  VS BP: (!) 154/98 Pulse: 60 (05/25/22 1700)  Heart Rate (Monitored): 70 (05/25/22 1450) Resp: 18  SpO2: 97 % Temp: 36.7 C (98.1 F)   SHOCK INDEX BASED ON LATEST  VITAL SIGNS (PULSE/SBP): .46  **These vitals reflect the first/last taken during this encounter. Within each group, they are not necessarily taken at the same time.   Physical Exam  Vitals and nursing note reviewed.   Constitutional:       General: She is not in acute distress.     Appearance: Normal appearance. She is obese. She is not ill-appearing.   HENT:      Head: Normocephalic and atraumatic.      Right Ear: Tympanic membrane, ear canal and external ear normal.      Left Ear: Tympanic membrane, ear canal and external ear normal.      Nose: Nose normal. No congestion or rhinorrhea.      Mouth/Throat:      Mouth: Mucous membranes are moist.      Pharynx: Oropharynx is clear. No oropharyngeal exudate or posterior oropharyngeal erythema.   Eyes:      Extraocular Movements: Extraocular movements intact.      Conjunctiva/sclera: Conjunctivae normal.      Pupils: Pupils are equal, round, and reactive to light.   Neck:      Comments: No midline cervical, thoracic or lumbar spine tenderness.  No bowel bladder incontinence or saddle anesthesia.  No weakness to upper or lower extremity.  Cap refill within normal limits  Cardiovascular:      Rate and Rhythm: Normal rate and regular rhythm.      Pulses: Normal pulses.      Heart sounds: Normal heart sounds.   Pulmonary:      Effort: Pulmonary effort is normal. No respiratory distress.      Breath sounds: Normal breath sounds. No stridor. No wheezing, rhonchi or rales.   Chest:      Chest wall: No tenderness.   Abdominal:      General: Abdomen is flat. Bowel sounds are normal. There is no distension.      Palpations: Abdomen is soft. There is no mass.      Tenderness: There is no abdominal tenderness. There is no right CVA tenderness, left CVA tenderness, guarding or rebound.      Hernia: No hernia is present.   Musculoskeletal:         General: No swelling, tenderness, deformity or signs of injury. Normal range of motion.      Cervical back: Normal range of motion and  neck supple. No rigidity or tenderness.      Right lower leg: No edema.      Left lower leg: No edema.   Skin:     General: Skin is warm and dry.      Capillary Refill: Capillary refill takes less than 2 seconds.      Coloration: Skin is not jaundiced or pale.      Findings: No bruising, erythema, lesion or rash.   Neurological:      General: No focal deficit present.      Mental Status: She is alert and oriented to person, place, and time. Mental status is at baseline.      Cranial Nerves: No cranial nerve deficit.      Sensory: No sensory deficit.      Motor: No weakness.      Coordination: Coordination normal.  Gait: Gait normal.      Deep Tendon Reflexes: Reflexes normal.   Psychiatric:         Mood and Affect: Mood normal.         Behavior: Behavior normal.          Diagnostic Studies   ECG Interpretation read at: 05/25/2022 2:55 PM  Rate: 69  Rhythm: sinus rhythm   Axis:  normal  Intervals:     -normal PR interval      -normal QRS interval      -normal QTc interval    ST segment: normal   Other findings: Yes     -PVCs: occasional PVCs    Comparison to prior:    Comments: No stemi    Results for orders placed or performed during the hospital encounter of 05/25/22   CBC with Auto Diff   Result Value Ref Range    Platelets 163 150 - 475 10*3/uL    WBC 10.1 4.5 - 10.6 10*3/uL    RBC 4.63 3.80 - 5.10 10    Hemoglobin 14.3 (H) 12.0 - 14.0 g/dL    Hematocrit 16.1 09.6 - 47.0 %    MCV 95.2 80.0 - 99.0 fL    MCH 30.9 26.0 - 33.0 pg    MCHC 32.4 32.0 - 35.0 g/dL    RDW 13 10 - 13 %   Automated Differential   Result Value Ref Range    Neutros% 62.7 %    Neutros Abs 6.31 1.80 - 7.90 10*3/uL    Lymphs % 28.1 %    Lymphs Abs 2.83 1.10 - 4.20 10*3/uL    Monos % 6.6 %    Monos Abs 0.66 0.00 - 1.00 10*3/uL    Eos % 1.8 %    Eosinophils Abs 0.18 0.00 - 0.50 10*3/uL    Basophil % 0.5 %    Basophil Abs 0.05 0.00 - 0.21 10*3/uL    Immature Grans (Abs) 0.03 0.00 - 0.53 10*3/uL    Immature Grans % 0.3 0.0 - 5.0 %     I  independently reviewed the above laboratory studies and when relevant, compared with previous values if available.    XR Chest 2 Views    Result Date: 05/25/2022  INDICATION:  chest pain TECHNIQUE: PA and lateral views  of the chest COMPARISON: 04/04/2022 FINDINGS: Unchanged cardiomediastinal silhouette. No focal pulmonary consolidation. No large pleural effusion. No pneumothorax.     IMPRESSION: Normal chest. I have personally reviewed the images and agree with and/or edited the report.    CT Head/Brain W/O Con    Result Date: 05/25/2022  EXAMINATION: CT HEAD/BRAIN W/O CON INDICATION: Fall and dizziness with syncope COMPARISON: Head CT performed 11/30/2021 TECHNIQUE: Axial CT images through the head were obtained without intravenous contrast. Coronal and sagittal reformats were reconstructed and reviewed. This CT scan was performed with one or more of the following dose optimization techniques: iterative reconstruction, automatic exposure control, and/or manual adjustment of mAs and kVp according to the patient's size. FINDINGS: No evidence of acute intracranial hemorrhage. No evidence of mass effect or midline shift. Gray-white matter differentiation is preserved. No extra axial fluid collection. The ventricles are normal in size and configuration. The basal cisterns are patent. The orbits demonstrate no acute abnormality. The visualized portions of the paranasal sinuses as well as the mastoid air cells are clear with a portion dehiscence since along the lateral margin of the left mastoid. The calvarium is intact.     IMPRESSION:  No CT evidence of acute intracranial abnormality.         Progress Notes, Medical Decision Making and Critical Care      Patient seen and evaluated in the emergency department.  the story is not consistent with pulmonary embolism so I do not feel that a D-dimer or a CT angio is necessary.  Patient's story is also not consistent with aortic dissection as the patient has no widened mediastinum  and no CP to back.  CXR without PNA.  The patient had a normal troponin in the emergency department, with an  EKG that was nonischemic. Troponin and CMP hemolyzed awaiting redraw of trop and CMP  Patient is nontoxic no signs and symptoms of distress.  Patient is chest pain-free at this time.  Report given to Dr. Eldridge Scot for further evaluation pending labs and re-evaluation.     Clinical Impression and Disposition    Diagnosis:  1. Syncope, unspecified syncope type    2. Chest pain, unspecified type                        Karren Cobble, CRNP  05/25/22 1841       Karren Cobble, CRNP  05/25/22 1845      -------------------------------------------------------------------------------  Attestation signed by Awilda Bill, MD at 05/26/22 1811  I was consulted by the advance practice provider regarding management of this case and agree with the assessment, plan, and disposition.  -------------------------------------------------------------------------------

## 2022-05-26 NOTE — ED Provider Notes (Signed)
ED Assumption of Care Note  Created:05/26/2022 - 13:19 - [facility] OBS 01     Diagnostic Studies   ECG Interpretation    Na 140   K+  3.4*   Cl 106   CO2 26   A-Gap 8   BUN 12   Cr 0.8   Glu  144*   Glu(P)     Ca 8.9   Mg 2.5   Phos     AST 30   ALT 20   AlkP 103   TBili 0.6   Prot 7.0   Alb 3.6   Lipase      WBC 10.1   Hb  14.3*   HCT 44.1   PLT 163   MCV 95.2   NEU% 62.7   NEU 6.31   LYM% 28.1   LYM 2.83   EOS% 1.8   EOS 0.18   IMMG% 0.3   IMMG 0.03   BANDS      PT     APTT     INR     DDIMER      U APP     U GLU     U BILI     U KET     U BLD     U pH     U PRO     URO B     U NIT     U LE     U SPG     U WBC     U RBC     U BACT     U SQUAM                    05/26/22  0558 05/25/22  1924 03/26/22  0714 03/25/22  1042 03/24/22  0650   CREATININE 0.8 0.7 0.9 0.8 0.8     I independently reviewed the above laboratory studies and when relevant, compared with previous values if available.   XR Chest 2 Views    Result Date: 05/25/2022  INDICATION:  chest pain TECHNIQUE: PA and lateral views  of the chest COMPARISON: 04/04/2022 FINDINGS: Unchanged cardiomediastinal silhouette. No focal pulmonary consolidation. No large pleural effusion. No pneumothorax.     IMPRESSION: Normal chest. I have personally reviewed the images and agree with and/or edited the report.    CT Head/Brain W/O Con    Result Date: 05/25/2022  EXAMINATION: CT HEAD/BRAIN W/O CON INDICATION: Fall and dizziness with syncope COMPARISON: Head CT performed 11/30/2021 TECHNIQUE: Axial CT images through the head were obtained without intravenous contrast. Coronal and sagittal reformats were reconstructed and reviewed. This CT scan was performed with one or more of the following dose optimization techniques: iterative reconstruction, automatic exposure control, and/or manual adjustment of mAs and kVp according to the patient's size. FINDINGS: No evidence of acute intracranial hemorrhage. No evidence of mass effect or midline shift. Gray-white matter differentiation  is preserved. No extra axial fluid collection. The ventricles are normal in size and configuration. The basal cisterns are patent. The orbits demonstrate no acute abnormality. The visualized portions of the paranasal sinuses as well as the mastoid air cells are clear with a portion dehiscence since along the lateral margin of the left mastoid. The calvarium is intact.     IMPRESSION: No CT evidence of acute intracranial abnormality.   I independently reviewed the above radiology studies and when relevant, compared with previous studies if available.:        Medications   Medications administered  in the ED   morphine (PF) 4 mg/mL injection 4 mg (4 mg Intravenous Given 05/25/22 1558)   ondansetron (ZOFRAN) injection 4 mg (4 mg Intravenous Given 05/25/22 1558)   sodium chloride 0.9 % bolus (1,000 mLs Intravenous New Bag 05/25/22 1558)   diphenhydrAMINE (BENADRYL) injection 25 mg (25 mg Intravenous Given 05/25/22 1614)   morphine (PF) 4 mg/mL injection 4 mg (4 mg Intravenous Given 05/26/22 0011)            Handoff Notes, Progress Notes, Medical Decision Making and Critical Care   Handoff note from previous shift:  Chest pain with N/V and syncope, CT head and EKG okay, chronically elevated dimer  H/o CP for months, obesity, htn, hyperlipidemia, sz, CVA. Allergies to contrast and most pain meds. VQ in January was negative for PE.  [ ]  Admit to [facility] for syncope, awaiting bed         LAST  VS BP: 121/75 Pulse: 71 (05/26/22 0500)  Heart Rate (Monitored): 59 (05/26/22 1223) Resp: (!) 28  SpO2: (!) 77 % (pt took O2 off.) Temp: 36.6 C (97.9 F)   FIRST  VS BP: (!) 154/98 Pulse: 60 (05/25/22 1700)  Heart Rate (Monitored): 70 (05/25/22 1450) Resp: 18  SpO2: 97 % Temp: 36.7 C (98.1 F)   SHOCK INDEX BASED ON LATEST VITAL SIGNS (PULSE/SBP): .58  **These vitals reflect the first/last taken during this encounter. Within each group, they are not necessarily taken at the same time.    ED Course as of 05/26/22 1319   Fri May 26, 2022    [id number] Signout was received from Dr. Magda Bernheim at 0300: Pt with chest and syncope admitted to [facility], awaiting bed assignment    Nurse called about the patient having chest pain. EKG ordered. Pt examined. Pt is well appearing on exam without acute distress. She states the pain is central, squeezing. No nausea, vomiting, diaphoresis, sob.     Pt has been evaluated previously for chest pain this year with normal VQ scan in January (has anaphylaxis to iv dye) at [facility] and normal Echo on admission to [facility].     Trop was 1930 was benign 7 but not negative. Repeat troponin ordered.  [AA]   0543 EKG 12 Lead PAIN, CHEST, LIGHTHEADEDNESS  EKG: 12 Lead ECG: , -- RATE: 59, -- RHYTHM:, sinus bradycardia, --INTERPRETATIONS:, occasional PVC noted, unifocal, non specific TWI III, no ST changes  [AA]   0736 Troponin T 5th Gen, High Sensitivity: 6 [AA]      ED Course User Index  [AA] [name] Chess, MD     I reviewed recent and relevant Chart Entries, Previous Notes, Previous Labs, Previous Radiology studies      Patient was reassessed this morning, she has had a largely unremarkable workup with stable troponins, no concerning lab derangements on CBC, chemistry, nonconcerning head CT and chest x-ray with no acute pathology seen, and EKG showing sinus rhythm with occasional PVCs, similar in appearance to previous EKG in January.    Patient has had stable vital signs throughout ED course.  Has been here for greater than 22 hours.  Still reports intermittent chest discomfort which comes and goes.  She has seen cardiology in the past however was unable to complete outpatient implantable loop monitoring due to a skin reaction to the monitor device.    Patient had an echo on 03/25/2022 showing EF of 50 to 55% with mild LVH, otherwise no acute concerning findings.  Valves appear  structurally normal.  Summary below.    Interpretation Summary  The left ventricle is normal in size.  Mild left ventricular  hypertrophy.  Ejection Fraction = 50-55%.  Left ventricular systolic function is normal.  The left ventricular wall motion is normal.  The right ventricle is normal size.  The right ventricular systolic function is normal.    Plan for today: we will send a repeat troponin as patient having ongoing intermittent chest discomfort though likelihood of ACS is low given chronicity of symptoms and stable EKG.    Orthostatic vitals are negative. Story is most consistent with vasovagal syncope. Patient is very low risk by [place] Syncope.    Extensive discussion with patient regarding her concerns and symptoms as well as her extended observation stay here. She still does not feel comfortable being discharged and would like to proceed with plan for medical admission as discussed by prior provider. I discussed with the patient that her workup is re-assuring for non-cardiac cause of syncope and chest pain.     Will await repeat troponin and re-assess. Signed out at shift change to Dr Elnoria Howard awaiting re-eval for possible ED discharge vs medical admit.                                                       Clinical Impression and Disposition    1. Syncope, unspecified syncope type    2. Chest pain, unspecified type      ED Disposition: Admit - 05/26/2022 01:33  Condition at time of disposition: Stable                Oswald Hillock, MD  05/26/22 [phone]

## 2022-05-26 NOTE — ED Provider Notes (Signed)
Emergency Department Observation Note    ED Course as of 05/26/22 1526   Fri May 26, 2022   1610 Signout was received from Dr. Magda Bernheim at 0300: Pt with chest and syncope admitted to [facility], awaiting bed assignment    Nurse called about the patient having chest pain. EKG ordered. Pt examined. Pt is well appearing on exam without acute distress. She states the pain is central, squeezing. No nausea, vomiting, diaphoresis, sob.     Pt has been evaluated previously for chest pain this year with normal VQ scan in January (has anaphylaxis to iv dye) at [facility] and normal Echo on admission to [facility].     Trop was 1930 was benign 7 but not negative. Repeat troponin ordered.  [AA]   0543 EKG 12 Lead PAIN, CHEST, LIGHTHEADEDNESS  EKG: 12 Lead ECG: , -- RATE: 59, -- RHYTHM:, sinus bradycardia, --INTERPRETATIONS:, occasional PVC noted, unifocal, non specific TWI III, no ST changes  [AA]   0736 Troponin T 5th Gen, High Sensitivity: 6 [AA]      ED Course User Index  [AA] Wendie Chess, MD       Observation Discharge    Upon reevaluation patient:      - no significant worsening of their clinical condition and did not require repeated dosing of medications and were taken out of observation status.     - Pt remained HDS in NAD, well-appearing on reassessment     - Rpt trop today at 12:15 was <6, do not suspect ACS, ruled out per [facility] protocol     - do not suspect cardiac cause of syncope/presyncope, orthostatics normal. Likely vasovagal     - Pertinent lab and radiology results reviewed with patient.  Pt allowed to ask questions.  Appropriate outpatient follow up plan discussed and patient expresses understanding.  Explicit return precautions given.    Observation discharge time and date: 15:39 on 05/26/2022   Total Clinical Care Time: Total clinical care time was less than 30 minutes.    ED Disposition: Discharge - 05/26/2022 15:26    Diagnosis:   1. Syncope, unspecified syncope type        2. Chest pain,  unspecified type           Condition at time of disposition: Stable                 Alanda Slim, MD  05/26/22 [phone]

## 2022-05-26 NOTE — ED Provider Notes (Signed)
ED Course as of 05/26/22 0740   Fri May 26, 2022   1610 Signout was received from Dr. Magda Bernheim at 0300: Pt with chest and syncope admitted to [facility], awaiting bed assignment    Nurse called about the patient having chest pain. EKG ordered. Pt examined. Pt is well appearing on exam without acute distress. She states the pain is central, squeezing. No nausea, vomiting, diaphoresis, sob.     Pt has been evaluated previously for chest pain this year with normal VQ scan in January (has anaphylaxis to iv dye) at [facility] and normal Echo on admission to [facility].     Trop was 1930 was benign 7 but not negative. Repeat troponin ordered.  [AA]   0543 EKG 12 Lead PAIN, CHEST, LIGHTHEADEDNESS  EKG: 12 Lead ECG: , -- RATE: 59, -- RHYTHM:, sinus bradycardia, --INTERPRETATIONS:, occasional PVC noted, unifocal, non specific TWI III, no ST changes  [AA]   0736 Troponin T 5th Gen, High Sensitivity: 6 [AA]      ED Course User Index  [AA] Wendie Chess, MD

## 2022-06-12 NOTE — ED Triage Notes (Signed)
C/o sharp cp, back pain, lightheaded, sob, nauseated since 2000. Ambulatory to triage. Speaking in full sentences denies fever or cough

## 2022-06-13 NOTE — ED Notes (Signed)
Pt presents to the ED with sternal chest pain that radiates to her left shoulder/neck and back. Pt also states that she had what she describes as a period when she was watching tv and was unresponsive to her husband with her eyes open for 3 minutes. Pt is A/Ox4 at this time. Pt stable. Awaiting dispo.

## 2022-06-13 NOTE — ED Provider Notes (Signed)
Patient Evaluated: 06/13/2022 - 00:34 - [facility] ED 10    Means of arrival:Car        CC / HPI                                                                                                                                                                           Chief Complaint from Triage Note:   "Patient presents with:  Pain, Chest  C/o sharp cp, back pain, lightheaded, sob, nauseated since 2000. Ambulatory to triage. Speaking in full sentences denies fever or cough"    Limitations to History:  None    HPI:  [name] Cook is a 47 y.o. female who presents with chest pain, shortness of breath, nausea, and possible syncopal episode.    Patient says that she was in her normal state of health all day today, then went up to her spare bedroom to watch TV at approximately 8:30 PM.  Patient says that approximately 10 PM she was speaking to her husband who was coming in and out of the room intermittently.  Patient husband returned to the room, and noticed that she was slumping over to the left side, so he ran over and "caught her".  Patient says that she has poor recollection of these details but husband reports no seizure-like behavior or slurred speech.  Patient recovered, but then noted the development of a chest pain and low back pain with shortness of breath.  Patient describes the chest pain as a pressure.  No radiation of the chest pain.  No diaphoresis.  Patient is nauseated without vomiting.  No fever.  No chills.  No changes in bowel movements.    Presents to the emergency department because symptoms were concerning, and she felt that she needed evaluation.    Independent Historian(s):   None    External Records Reviewed:   I have reviewed available external records, including CRISP entries.    03/25/2022 - 2-D Echo - EF 50-55%, no significant findings.  Bilateral LE [place] - no DVT.    04/09/2022 - V/Q Scan - Normal Examination  05/25/2022 - Head CT - Normal CT    05/25/2022 - Similar syncopal episode presented to [facility] with ultimately negative workup     Relevant Past Medical, Social and Family History - See resident note (if applicable) for additional historical information   Past Medical / Surgical History:  has a past medical history of Cancer (CMS/HCC), Cervical cancer (CMS/HCC), CVA (cerebral vascular accident) (CMS/HCC), HLD (hyperlipidemia), HTN (hypertension), Hypertension, Morbid obesity with BMI of 40.0-44.9, adult (CMS/HCC), Seizure disorder (CMS/HCC), Seizures (CMS/HCC), and TIA (transient ischemic attack).  has a past surgical history that includes hysterectomy abdominal (2021); hx hysterectomy;  esophagogastroduodenoscopy (Bilateral, 05/17/2020); enterscopy (N/A, 05/17/2020); sling pubovaginal (N/A, 06/08/2020); colporrhaphy (N/A, 06/08/2020); perineoplasty (N/A, 06/08/2020); hx appendectomy (01/29/18); removal / revision of sling for stress incontinence (N/A, 06/22/2020); cystoscopy (N/A, 06/22/2020); cystoscopy biopsy bladder (N/A, 06/22/2020); and cystoscopy (N/A, 08/04/2020).  Family History: family history includes Breast Cancer in her sister; Cervical Cancer in her sister; Diabetes mellitus in her father and mother; Hypertension in her father and mother.  Social Hx:  reports that she has never smoked. She has never used smokeless tobacco. She reports that she does not currently use alcohol. She reports that she does not use drugs.   Current Outpatient Medications on File Prior to Encounter   Medication Last Dose   . amLODIPine (NORVASC) 5 MG tablet    . aspirin 325 MG EC tablet    . atorvastatin (LIPITOR) 40 MG tablet    . hydrALAZINE (APRESOLINE) 25 MG tablet    . hydroCHLOROthiazide 25 MG tablet    . levETIRAcetam (KEPPRA) 500 MG tablet    . lisinopril (ZESTRIL) 10 MG tablet    . ondansetron (ZOFRAN) 4 mg tablet      ALLERGIES: Acetaminophen, Butalbital-apap-caffeine, Cyclobenzaprine, Hydrocodone-acetaminophen, Ibuprofen, Iodinated Contrast - Iv Or Oral, Iodinated Contrast Media, Ketorolac Tromethamine,  Nsaids, Oxycodone-acetaminophen, Shellfish-derived Products, Tramadol, Ceftriaxone, Ciprofloxacin, Dicyclomine, Egg-derived Products, Metoclopramide, Promethazine      Review of Systems - See HPI and resident note (if applicable)  for additional ROS Information   No LMP recorded (lmp unknown). Patient has had a hysterectomy.  Review of Systems   Constitutional: Negative.  Negative for fatigue.   HENT: Negative.  Negative for drooling and facial swelling.    Eyes: Negative.    Respiratory:  Positive for shortness of breath. Negative for cough.    Cardiovascular:  Positive for chest pain.   Gastrointestinal:  Positive for nausea. Negative for abdominal pain and vomiting.   Genitourinary: Negative.  Negative for frequency.   Musculoskeletal: Negative.  Negative for arthralgias.   Neurological:  Positive for syncope. Negative for dizziness, seizures, facial asymmetry, light-headedness, numbness and headaches.   Psychiatric/Behavioral: Negative.  Negative for hallucinations.    All other systems reviewed and are negative.        Physical Exam     LAST  VS BP: (!) 150/100 Pulse: 74 (06/12/22 2337)  Heart Rate (Monitored): 65 (06/13/22 0115) Resp: (!) 22  SpO2: 95 % Temp: 36.6 C (97.8 F)   FIRST  VS BP: (!) 186/104 Pulse: 74 (06/12/22 2337)  Heart Rate (Monitored): 65 (06/13/22 0115) Resp: 20  SpO2: 98 % Temp: 36.6 C (97.8 F)   SHOCK INDEX BASED ON LATEST VITAL SIGNS (PULSE/SBP): .49  **These vitals reflect the first/last taken during this encounter. Within each group, they are not necessarily taken at the same time.    Physical Exam  Vitals and nursing note reviewed.   Constitutional:       General: She is not in acute distress.     Appearance: Normal appearance. She is obese. She is not ill-appearing, toxic-appearing or diaphoretic.   HENT:      Head: Normocephalic and atraumatic. No raccoon eyes or Battle's sign.      Jaw: No trismus or tenderness.      Right Ear: External ear normal.      Left Ear: External ear  normal.      Nose: Nose normal. No congestion or rhinorrhea.      Mouth/Throat:      Mouth: Mucous membranes are moist.   Eyes:  General: Lids are normal.      Extraocular Movements: Extraocular movements intact.      Conjunctiva/sclera: Conjunctivae normal.      Pupils: Pupils are equal, round, and reactive to light. Pupils are equal.   Cardiovascular:      Rate and Rhythm: Normal rate and regular rhythm.      Pulses: Normal pulses.      Heart sounds: Normal heart sounds, S1 normal and S2 normal. Heart sounds not distant. No murmur heard.     No systolic murmur is present.      No diastolic murmur is present.      No friction rub. No gallop.   Pulmonary:      Effort: Pulmonary effort is normal. No tachypnea, bradypnea or respiratory distress.      Breath sounds: Normal breath sounds. No stridor. No decreased breath sounds, wheezing, rhonchi or rales.   Chest:      Chest wall: No tenderness.   Abdominal:      General: Abdomen is flat. There is no distension.      Palpations: Abdomen is soft. There is no mass.      Tenderness: There is no abdominal tenderness. There is no guarding or rebound.      Hernia: No hernia is present.   Musculoskeletal:         General: Normal range of motion.      Cervical back: Normal range of motion and neck supple. No rigidity or tenderness.      Right lower leg: No edema.      Left lower leg: No edema.   Skin:     General: Skin is warm.   Neurological:      General: No focal deficit present.      Mental Status: She is alert. Mental status is at baseline.      GCS: GCS eye subscore is 4. GCS verbal subscore is 5. GCS motor subscore is 6.      Gait: Gait is intact. Gait normal.          Diagnostic Studies     #1 EKG Interpretation, 06/12/2022 at 2241 hrs.  Rate: 72 // Rhythm: Sinus // Axis: Normal // Intervals: PR - 148, QRS - 94, QT - 477 // Segments: Normal // T-Waves: Flat - V4-V6 // Other: PVCs // Impression: NSR with PVCs, Prolonged QT, and non-specific T wave changes.  Compared  to 04/04/2022 and no significant interval change.         Results for orders placed or performed during the hospital encounter of 06/12/22   Troponin T 5th Gen, High Sensitivity   Result Value Ref Range    Troponin T 5th Gen, High Sensitivity <6 <=14 ng/L   CBC with Auto Diff   Result Value Ref Range    WBC 11.6 (H) 4.5 - 10.6 10*3/uL    RBC 4.59 3.80 - 5.10 10    Hemoglobin 13.9 12.0 - 14.0 g/dL    Hematocrit 16.1 09.6 - 47.0 %    MCV 94.1 80.0 - 99.0 fL    MCH 30.6 26.0 - 33.0 pg    MCHC 32.6 32.0 - 35.0 g/dL    Platelets 045 (L) 409 - 475 10*3/uL    MPV 13.7 11.0 - 15.0 fL    RDW 13 10 - 13 %   CMP   Result Value Ref Range    Sodium 141 136 - 145 mmol/L    Potassium 3.1 (L) 3.5 - 5.1 mmol/L  Chloride 110 (H) 98 - 107 mmol/L    CO2 Total 23 22 - 29 mmol/L    Glucose Bld 110 (H) 70 - 100 mg/dL    Creatinine 0.7 0.7 - 1.2 mg/dL    BUN 13 6 - 23 mg/dL    Calcium 8.1 (L) 8.6 - 10.2 mg/dL    Total Protein 6.6 6.6 - 8.7 g/dL    Albumin 3.6 3.5 - 5.2 g/dL    Bilirubin Total 0.6 0.1 - 1.0 mg/dL    Alk Phos 540 35 - 981 unit/L    AST 20 5 - 32 U/L    ALT 22 5 - 33 U/L    Anion Gap 8 8 - 18 mmol/L   Lipase   Result Value Ref Range    Lipase 20 13 - 60 unit/L   Mg   Result Value Ref Range    Magnesium 1.7 1.7 - 2.6 mg/dL   Automated Diff Confirmation   Result Value Ref Range    Neutros % 64.0 %    Lymphs % 29.0 %    Monos % 7.0 %    RBC Morphology Normal     Plt Morphology Normal     Neutros Abs 7.42 1.80 - 7.90 10*3/uL    Absolute Lymphocytes 3.36 1.10 - 4.20 10*3/uL    Absolute Monocytes 0.81 0.00 - 1.00 10*3/uL   Estimated GFR   Result Value Ref Range    EGFR Result 108 >=60   EKG 12 Lead cp   Result Value Ref Range    Ventricular Rate 72 BPM    Atrial Rate 72 BPM    P-R Interval 148 ms    QRS Duration 94 ms    Q-T Interval 436 ms    QTC Calculation (Bazett) 477 ms    P Axis 7 degrees    R Axis 31 degrees    T Axis 45 degrees     I independently reviewed the above laboratory studies and when relevant, compared with  previous values if available.    XR Chest 2 Views    Result Date: 06/13/2022  INDICATION: Chest Pain, Syncope XR CHEST 2 VIEWS: PA and Lateral chest examination. COMPARISON: 05/25/2022 SUPPORT DEVICES: There are no support devices in place. LUNGS AND AIRWAY: The lungs and airways are clear.  PLEURA: There is no pneumothorax.  There is no pleural effusion. MEDIASTINUM: The aorta is mildly tortuous, but stable. The cardiomediastinal silhouette is stable. BONES: There are no displaced fractures identified. SOFT TISSUES: Soft tissues are unremarkable.  There is no free intraperitoneal air. *     IMPRESSION: No acute cardiopulmonary abnormality RECOMMENDATION: No specific recommendation. COMMUNICATION: Routine communication via the electronic medical record.    I independently reviewed the above radiology studies and when relevant, compared with previous studies if available.    Independent Interpretation of Imaging Studies:  Chest x-ray: No acute cardiopulmonary findings.        Progress Notes, Medical Decision Making and Critical Care    Differentials:  Differential includes was not limited to arrhythmia, ACS/MI, electrolyte disturbance, anemia, vasovagal syncope, syncope, etc.    Assessment / Plan / Interventions / ED Course:    ED Course as of 06/13/22 0222   Tue Jun 13, 2022   [id number] 47 year old female patient presents emergency department with possible syncopal event, chest pain, currently with significantly elevated blood pressure.  Workup to include comprehensive history, physical examination, blood work, EKG, and chest x-ray.  Treatment to include observation in the  emergency department, aspirin. [TO]   0143 HS Troponin < 6.  As per emergency department protocol this represents a rule-out of myocardial ischemia.  Potassium 3.1 -- will replete orally. [TO]   0221 Patient reports some slight improvement in symptoms.  Request additional pain medication, but patient's allergy profile prohibits most medications.  Patient  says that she will follow-up with her doctor.    Discussed with patient recent visit made last month for similar syncopal episode.  Patient says all testing was negative, and she did follow-up with her cardiologist.  Patient is due to have a outpatient stress test, but it still needs to be scheduled.  Stressed to patient the importance of outpatient follow-up and completing all testing with her cardiologist.  Patient verbalized understanding. [TO]      ED Course User Index  [TO] Scharlene Corn, MD       Discussion:  History, physical examination, and presentation is most consistent with likely syncopal episode.  Workup is significant for hypokalemia, for which we have provided oral repletion in the emergency department and a prescription will be given.  In my clinical judgment, this is not an atypical presentation of pulmonary embolism; workup not indicated.  As per emergency department protocol, patient has been ruled out for myocardial ischemia.  Given recent negative workups as inpatient, in my clinical judgment patient is stable and appropriate for discharge with close outpatient follow-up.  After extensive discussion of patient with risks and benefits, patient is in agreement.    Patient informed of all workup test results.  Informed of limitations of Emergency Department workup. Patient accepts and understands limitations of ED testing. Understands ED workup is NOT a "clean bill of health".  Understands need for timely followup within next 7-10 days.  Patient understood and accepts risk of discharge including morbidity and mortality.    Plan of care, expected treatment course, and discharge instructions reviewed with patient.  Patient indicated understanding.   All questions answered.  Signs and symptoms for which to return to the emergency department reviewed and understood.        Discussion of Management with other Physicians, QHP or Appropriate Source:  N/A              Clinical Impression and Disposition     Diagnosis:  1. Vasovagal syncope    2. Chest pain    3. Hypokalemia      ED Disposition: Discharge - 06/13/2022 02:18  Condition at time of disposition: Good                  Scharlene Corn, MD  06/13/22 [phone]

## 2022-06-30 ENCOUNTER — Emergency Department: Payer: Medicaid Managed Care Other

## 2022-06-30 ENCOUNTER — Observation Stay
Admission: EM | Admit: 2022-06-30 | Discharge: 2022-07-01 | Disposition: A | Discharge status: Home / Self Care | Payer: Medicaid Managed Care Other | Attending: Internal Medicine | Admitting: Internal Medicine

## 2022-06-30 DIAGNOSIS — E7800 Pure hypercholesterolemia, unspecified: Secondary | ICD-10-CM | POA: Insufficient documentation

## 2022-06-30 DIAGNOSIS — J984 Other disorders of lung: Secondary | ICD-10-CM | POA: Insufficient documentation

## 2022-06-30 DIAGNOSIS — R079 Chest pain, unspecified: Secondary | ICD-10-CM | POA: Diagnosis present

## 2022-06-30 DIAGNOSIS — G40909 Epilepsy, unspecified, not intractable, without status epilepticus: Secondary | ICD-10-CM | POA: Insufficient documentation

## 2022-06-30 DIAGNOSIS — R0789 Other chest pain: Principal | ICD-10-CM | POA: Insufficient documentation

## 2022-06-30 DIAGNOSIS — I160 Hypertensive urgency: Secondary | ICD-10-CM | POA: Insufficient documentation

## 2022-06-30 DIAGNOSIS — I10 Essential (primary) hypertension: Secondary | ICD-10-CM | POA: Insufficient documentation

## 2022-06-30 DIAGNOSIS — D696 Thrombocytopenia, unspecified: Secondary | ICD-10-CM | POA: Insufficient documentation

## 2022-06-30 DIAGNOSIS — Z6841 Body Mass Index (BMI) 40.0 and over, adult: Secondary | ICD-10-CM | POA: Insufficient documentation

## 2022-06-30 DIAGNOSIS — Z86711 Personal history of pulmonary embolism: Secondary | ICD-10-CM | POA: Insufficient documentation

## 2022-06-30 DIAGNOSIS — Z8673 Personal history of transient ischemic attack (TIA), and cerebral infarction without residual deficits: Secondary | ICD-10-CM | POA: Insufficient documentation

## 2022-06-30 DIAGNOSIS — Z8541 Personal history of malignant neoplasm of cervix uteri: Secondary | ICD-10-CM | POA: Insufficient documentation

## 2022-06-30 DIAGNOSIS — K219 Gastro-esophageal reflux disease without esophagitis: Secondary | ICD-10-CM | POA: Insufficient documentation

## 2022-06-30 LAB — COMPREHENSIVE METABOLIC PANEL
ALT: 26 U/L (ref 0–55)
AST (SGOT): 27 U/L (ref 5–41)
Albumin/Globulin Ratio: 0.9 (ref 0.9–2.2)
Albumin: 4.2 g/dL (ref 3.5–5.0)
Alkaline Phosphatase: 130 U/L — ABNORMAL HIGH (ref 37–117)
Anion Gap: 11 (ref 5.0–15.0)
BUN: 11 mg/dL (ref 7.0–21.0)
Bilirubin, Total: 0.9 mg/dL (ref 0.2–1.2)
CO2: 25 mEq/L (ref 17–29)
Calcium: 9.6 mg/dL (ref 8.5–10.5)
Chloride: 106 mEq/L (ref 99–111)
Creatinine: 0.9 mg/dL (ref 0.4–1.0)
Globulin: 4.9 g/dL — ABNORMAL HIGH (ref 2.0–3.6)
Glucose: 90 mg/dL (ref 70–100)
Potassium: 3.6 mEq/L (ref 3.5–5.3)
Protein, Total: 9.1 g/dL — ABNORMAL HIGH (ref 6.0–8.3)
Sodium: 142 mEq/L (ref 135–145)
eGFR: >60 mL/min/{1.73_m2} (ref >=60)

## 2022-06-30 LAB — CBC AND DIFFERENTIAL
Absolute NRBC: 0 10*3/uL (ref 0.00–0.00)
Basophils Absolute Automated: 0.05 10*3/uL (ref 0.00–0.08)
Basophils Automated: 0.5 %
Eosinophils Absolute Automated: 0.17 10*3/uL (ref 0.00–0.44)
Eosinophils Automated: 1.8 %
Hematocrit: 40.5 % (ref 34.7–43.7)
Hgb: 13.2 g/dL (ref 11.4–14.8)
Immature Granulocytes Absolute: 0.02 10*3/uL (ref 0.00–0.07)
Immature Granulocytes: 0.2 %
Instrument Absolute Neutrophil Count: 5.58 10*3/uL (ref 1.10–6.33)
Lymphocytes Absolute Automated: 3.14 10*3/uL (ref 0.42–3.22)
Lymphocytes Automated: 32.9 %
MCH: 30.9 pg (ref 25.1–33.5)
MCHC: 32.6 g/dL (ref 31.5–35.8)
MCV: 94.8 fL (ref 78.0–96.0)
MPV: 13.8 fL — ABNORMAL HIGH (ref 8.9–12.5)
Monocytes Absolute Automated: 0.59 10*3/uL (ref 0.21–0.85)
Monocytes: 6.2 %
Neutrophils Absolute: 5.58 10*3/uL (ref 1.10–6.33)
Neutrophils: 58.4 %
Nucleated RBC: 0 /100 WBC (ref 0.0–0.0)
Platelets: 131 10*3/uL — ABNORMAL LOW (ref 142–346)
RBC: 4.27 10*6/uL (ref 3.90–5.10)
RDW: 13 % (ref 11–15)
WBC: 9.55 10*3/uL — ABNORMAL HIGH (ref 3.10–9.50)

## 2022-06-30 LAB — HIGH SENSITIVITY TROPONIN-I: hs Troponin-I: 2.9 ng/L

## 2022-06-30 LAB — D-DIMER: D-Dimer: 0.9 ug/mL FEU — ABNORMAL HIGH (ref 0.00–0.60)

## 2022-06-30 MED ORDER — HYDROMORPHONE HCL 1 MG/ML IJ SOLN
1.0000 mg | Freq: Once | INTRAMUSCULAR | Status: AC
Start: 2022-06-30 — End: 2022-06-30
  Administered 2022-06-30: 1 mg via INTRAVENOUS
  Filled 2022-06-30: qty 1

## 2022-06-30 MED ORDER — FAMOTIDINE 10 MG/ML IV SOLN (WRAP)
20.0000 mg | Freq: Once | INTRAVENOUS | Status: AC
Start: 2022-06-30 — End: 2022-06-30
  Administered 2022-06-30: 20 mg via INTRAVENOUS
  Filled 2022-06-30: qty 2

## 2022-06-30 MED ORDER — ONDANSETRON HCL 4 MG/2ML IJ SOLN
4.0000 mg | Freq: Once | INTRAMUSCULAR | Status: AC
Start: 2022-06-30 — End: 2022-06-30
  Administered 2022-06-30: 4 mg via INTRAVENOUS
  Filled 2022-06-30: qty 2

## 2022-06-30 MED ORDER — LORAZEPAM 2 MG/ML IJ SOLN
1.0000 mg | Freq: Once | INTRAMUSCULAR | Status: DC
Start: 2022-06-30 — End: 2022-07-01
  Filled 2022-06-30: qty 1

## 2022-06-30 MED ORDER — LORAZEPAM 1 MG PO TABS
2.0000 mg | ORAL_TABLET | Freq: Once | ORAL | Status: DC
Start: 2022-06-30 — End: 2022-06-30

## 2022-06-30 MED ORDER — SODIUM CHLORIDE 0.9 % IV BOLUS
1000.0000 mL | Freq: Once | INTRAVENOUS | Status: AC
Start: 2022-06-30 — End: 2022-06-30
  Administered 2022-06-30: 1000 mL via INTRAVENOUS

## 2022-06-30 NOTE — ED Notes (Signed)
Pt BP 150/70 right arm and reassessed at 178/82 in left arm

## 2022-06-30 NOTE — ED Provider Notes (Signed)
[facility] EMERGENCY DEPARTMENT H&P           CLINICAL INFORMATION        HPI:      Chief Complaint: Chest Pain and Shortness of Breath  .    [name] Cook is a 47 y.o. female who presents with chest pain, shortness of breath.  Started while sitting on couch.  Radiates to back into the jaw.  No history of similar chest pain.  Does note a history of GERD but not similar sensation.  No recent exertional symptoms.  Notes family history of MI at age 1 and her father.  History of hypertension, no smoking.  No leg swelling.    {~~Source of history ~~:29379::"History obtained from: Patient"}    Triage Note: Came in to ED ambulatory c/o chest pain, SOB, dizziness and nausea started at 1800h tonight. Pt also reports back pain. Denies recent travels/smoking. Denies any cardiac issues. A&Ox4. Hx of HTN, CVA and cervical Ca (06/30/22 2100)      Physical Exam:      Vitals:    06/30/22 2100   BP: (!) 169/135   Pulse: 64   Resp: 18   Temp: 97.6 F (36.4 C)   TempSrc: Temporal   SpO2: 100%   Weight: 135.6 kg   Height: 5\' 10"  (1.778 m)       Nursing notes and vitals reviewed.   ***  Constitutional: alert, no acute distress  Eyes: EOM intact, conjugate gaze  HEENT: Neck supple with normal ROM  Cardiac: Warm and well perfused  Respiratory: Normal rate, No increased work of breathing   Abdomen/GI:  nondistended  Neurologic: alert, oriented, conversant, moves all extremities, stable gait  MSK: No deformities or crepitus, full ROM of joints without pain or limitation  Skin: warm and dry, no obvious rashes  Psychiatric: pleasant and cooperative            PAST HISTORY        Primary Care Provider: Letitia Libra, MD        PMH/PSH:    .     Past Medical History:   Diagnosis Date    Hypercholesteremia     Hypertension     Seizures     Stroke     2015 and 2017, right side    TIA (transient ischemic attack) 2017       She has a past surgical history that includes APPENDECTOMY (OPEN); Cholecystectomy; EGD,  BIOPSY (N/A, 04/16/2017); and Hysterectomy.      Social/Family History:      She reports that she has never smoked. She has never used smokeless tobacco. She reports that she does not drink alcohol and does not use drugs.    Family History   Problem Relation Age of Onset    Myocardial Infarction Father 76    Deep vein thrombosis Father     No known problems Mother          Listed Medications on Arrival:    .     Home Medications               albuterol sulfate HFA (PROVENTIL) 108 (90 Base) MCG/ACT inhaler     Inhale 2 puffs into the lungs every 4 (four) hours as needed for Wheezing or Shortness of Breath     amLODIPine (NORVASC) 5 MG tablet     Take 1 tablet (5 mg total) by mouth daily     aspirin 81 MG chewable tablet  Chew 1 tablet (81 mg total) by mouth daily     atorvastatin (LIPITOR) 40 MG tablet     Take 1 tablet (40 mg total) by mouth daily     LevETIRAcetam (KEPPRA PO)     Take 500 mg by mouth 2 (two) times daily.        lisinopril (ZESTRIL) 40 MG tablet     Take 1 tablet (40 mg) by mouth daily     magnesium oxide (MAG-OX) 400 MG tablet     Take 1 tablet (400 mg) by mouth 2 (two) times daily     pantoprazole (PROTONIX) 40 MG tablet     Take 1 tablet (40 mg total) by mouth daily           Allergies: She is allergic to contrast [iodinated contrast media], fioricet [butalbital-apap-caffeine], flexeril [cyclobenzaprine], bentyl [dicyclomine], fentanyl, iodine, motrin [ibuprofen], percocet [oxycodone-acetaminophen], shellfish-derived products, toradol [ketorolac tromethamine], tramadol, and tylenol [acetaminophen].            VISIT INFORMATION        Medications Given in the ED:    .     ED Medication Orders (From admission, onward)      Start Ordered     Status Ordering Provider    06/30/22 2148 06/30/22 2147  famotidine (PEPCID) injection 20 mg  Once        Route: Intravenous  Ordered Dose: 20 mg       Ordered Tiler Brandis G    06/30/22 2148 06/30/22 2147  HYDROmorphone (DILAUDID) injection 1 mg  Once         Route: Intravenous  Ordered Dose: 1 mg       Ordered Tiann Saha G    06/30/22 2143 06/30/22 2142  sodium chloride 0.9 % bolus 1,000 mL  Once        Route: Intravenous  Ordered Dose: 1,000 mL       Ordered Teshara Moree G    06/30/22 2143 06/30/22 2142  ondansetron (ZOFRAN) injection 4 mg  Once        Route: Intravenous  Ordered Dose: 4 mg       Ordered Deen Deguia G              Procedures:      Procedures      Interpretations:      O2 sat-           saturation: 100 %; Oxygen use: {.:29312::"room air"}; Interpretation: {.:29106::"Normal"}      EKG reviewed and interpreted by me:  Rhythm: Sinus  Rate: ***  Intervals: Normal  Axis: Normal  Ectopy: None  ST changes: No acute ischemic changes    Monitor data reviewed and interpreted by me - ***                            RESULTS        Lab Results:      Results       ** No results found for the last 24 hours. **                Radiology Results:      XR Chest 2 Views   Final Result         1. No acute cardiopulmonary processes.      Kristine Linea, MD   06/30/2022 9:30 PM      CT Chest without Contrast    (Results  Pending)              Visit date: 06/30/2022      CLINICAL SUMMARY           Diagnosis:    .     Final diagnoses:   None         MDM/ED Course:            Differential Diagnosis Considerations:      Re-evaluation    ***    Discharge / Follow up Planning    ***                        Disposition Decision:      Discharge    Standard discharge instructions including symptomatic management, expected course of disease as outpatient, return instructions, follow-up plan discussed with patient.  Patient confirmed understanding of instructions ***    Admit request discussed with Dr. Marland Kitchen  . Pt accepted for admission to ***. Patient admitted in stable condition.              Scribe Attestation:      {~~Scribe or no?~~:29594}       Parts of this note were generated by the Epic EMR system/ Dragon speech recognition and may contain inherent errors or omissions  not intended by the user. Grammatical errors, random word insertions, deletions, pronoun errors and incomplete sentences are occasional consequences of this technology due to software limitations. Not all errors are caught or corrected. If there are questions or concerns about the content of this note or information contained within the body of this dictation they should be addressed directly with the author for clarification.

## 2022-06-30 NOTE — ED Triage Notes (Addendum)
Pt presents c/o nausea, dizziness, midsternal cp radiating to L jaw, lower back pain, weakness starting at 1800 today. Hx of seizures, htn, hld, cancer, stroke on aspirin. Pt states baseline BP is 150s/90s and endorses compliance with meds.

## 2022-06-30 NOTE — EDIE (Signed)
[facility]?NOTIFICATION?06/30/2022 20:56?[name] Cook?MRN: [id number]    Criteria Met      5 ED Visits in 12 Months    Security and Safety  No Security Events were found.  ED Care Guidelines  There are currently no ED Care Guidelines for this patient. Please check your facility's medical records system.        Prescription Monitoring Program  Narx Score not available at this time.    E.D. Visit Count (12 mo.)  Facility Visits   [name] 4   [facility] 1   Total 5   Note: Visits indicate total known visits.     Recent Emergency Department Visit Summary  Date Facility [facility] Type Diagnoses or Chief Complaint    Jun 30, 2022  [facility]  [place]  [place]  Emergency      triage      Jun 12, 2022  [name] Bannister  [place]  Emergency      Syncope and collapse      Chest pain, unspecified      Hypokalemia      Pain, Chest      Light Headed, DIzzy, back pain, Chest pain      Apr 04, 2022  [facility] Rhett Bannister  MD  Emergency      Other chest pain      Essential (primary) hypertension      Chest pain, unspecified      Pain, Rib      Chest Pain,SOB,Left Side Body Pain      Oct 19, 2021  [name] Rhett Bannister  MD  Emergency      Headache, unspecified      Facial myokymia      Hypertension      Headache      high BP face twitching      Jul 13, 2021  [name] Rhett Bannister  MD  Emergency      Palpitations      Other specified abnormal findings of blood chemistry      Chest pain, unspecified      Pain, Chest      chest discomfort, back pain, nausea        Recent Inpatient Visit Summary  Date Facility [facility] Type Diagnoses or Chief Complaint    Nov 30, 2021  [facility] Rhett Bannister  MD  Internal Medicine      Weakness      Jul 29, 2021  [facility] [facility] [place] [facility]  [name]  [place]  Medical Surgical      Facial weakness        Care Team  No Care Team was found.  [facility]  This patient has registered at the [facility] Emergency  Department  For more information visit: [email or web address]     PLEASE NOTE:     1.   Any care recommendations and other clinical information are provided as guidelines or for historical purposes only, and providers should exercise their own clinical judgment when providing care.    2.   You may only use this information for purposes of treatment, payment or health care operations activities, and subject to the limitations of applicable [facility] Policies.    3.   You should consult directly with the organization that provided a care guideline or other clinical history with any questions about additional information or accuracy or completeness  of information provided.    ? 5102 [facility] - [email or web address]

## 2022-07-01 DIAGNOSIS — R079 Chest pain, unspecified: Secondary | ICD-10-CM | POA: Diagnosis present

## 2022-07-01 DIAGNOSIS — R072 Precordial pain: Secondary | ICD-10-CM

## 2022-07-01 DIAGNOSIS — I160 Hypertensive urgency: Secondary | ICD-10-CM

## 2022-07-01 LAB — ECG 12-LEAD
Atrial Rate: 69 {beats}/min
Atrial Rate: 91 {beats}/min
IHS MUSE NARRATIVE AND IMPRESSION: NORMAL
P Axis: 53 degrees
P Axis: 40 degrees
P-R Interval: 148 ms
P-R Interval: 160 ms
Q-T Interval: 486 ms
Q-T Interval: 406 ms
QRS Duration: 90 ms
QRS Duration: 86 ms
QTC Calculation (Bezet): 520 ms
QTC Calculation (Bezet): 499 ms
R Axis: 33 degrees
R Axis: 11 degrees
T Axis: 15 degrees
T Axis: 10 degrees
Ventricular Rate: 69 {beats}/min
Ventricular Rate: 91 {beats}/min

## 2022-07-01 LAB — HIGH SENSITIVITY TROPONIN-I
hs Troponin-I: <2.7 ng/L
hs Troponin-I: <2.7 ng/L

## 2022-07-01 MED ORDER — NALOXONE HCL 0.4 MG/ML IJ SOLN (WRAP)
0.2000 mg | INTRAMUSCULAR | Status: DC | PRN
Start: 2022-07-01 — End: 2022-07-01

## 2022-07-01 MED ORDER — HYDROMORPHONE HCL 1 MG/ML IJ SOLN
1.0000 mg | Freq: Once | INTRAMUSCULAR | Status: AC
Start: 2022-07-01 — End: 2022-07-01
  Administered 2022-07-01: 1 mg via INTRAVENOUS
  Filled 2022-07-01: qty 1

## 2022-07-01 MED ORDER — POTASSIUM CHLORIDE CRYS ER 20 MEQ PO TBCR
0.0000 meq | EXTENDED_RELEASE_TABLET | ORAL | Status: DC | PRN
Start: 2022-07-01 — End: 2022-07-01

## 2022-07-01 MED ORDER — FAMOTIDINE 20 MG PO TABS
20.0000 mg | ORAL_TABLET | Freq: Two times a day (BID) | ORAL | Status: DC
Start: 2022-07-01 — End: 2022-07-01
  Administered 2022-07-01: 20 mg via ORAL
  Filled 2022-07-01: qty 1

## 2022-07-01 MED ORDER — LIDOCAINE 5 % EX PTCH
1.0000 | MEDICATED_PATCH | CUTANEOUS | Status: DC
Start: 2022-07-01 — End: 2022-07-01
  Filled 2022-07-01: qty 1

## 2022-07-01 MED ORDER — GLUCAGON 1 MG IJ SOLR (WRAP)
1.0000 mg | INTRAMUSCULAR | Status: DC | PRN
Start: 2022-07-01 — End: 2022-07-01

## 2022-07-01 MED ORDER — ENOXAPARIN SODIUM 40 MG/0.4ML IJ SOSY
40.0000 mg | PREFILLED_SYRINGE | Freq: Every day | INTRAMUSCULAR | Status: DC
Start: 2022-07-01 — End: 2022-07-01
  Administered 2022-07-01: 40 mg via SUBCUTANEOUS
  Filled 2022-07-01: qty 0.4

## 2022-07-01 MED ORDER — BENZONATATE 100 MG PO CAPS
100.0000 mg | ORAL_CAPSULE | Freq: Three times a day (TID) | ORAL | Status: DC | PRN
Start: 2022-07-01 — End: 2022-07-01

## 2022-07-01 MED ORDER — MAGNESIUM SULFATE IN D5W 1-5 GM/100ML-% IV SOLN
1.0000 g | INTRAVENOUS | Status: DC | PRN
Start: 2022-07-01 — End: 2022-07-01

## 2022-07-01 MED ORDER — POTASSIUM & SODIUM PHOSPHATES 280-160-250 MG PO PACK
2.0000 | PACK | ORAL | Status: DC | PRN
Start: 2022-07-01 — End: 2022-07-01

## 2022-07-01 MED ORDER — DROPERIDOL 2.5 MG/ML IJ SOLN
1.2500 mg | Freq: Once | INTRAMUSCULAR | Status: AC
Start: 2022-07-01 — End: 2022-07-01
  Administered 2022-07-01: 1.25 mg via INTRAVENOUS
  Filled 2022-07-01: qty 2

## 2022-07-01 MED ORDER — ALUM & MAG HYDROXIDE-SIMETH 200-200-20 MG/5ML PO SUSP
30.0000 mL | ORAL | Status: DC | PRN
Start: 2022-07-01 — End: 2022-07-01

## 2022-07-01 MED ORDER — FAMOTIDINE 10 MG/ML IV SOLN (WRAP)
20.0000 mg | Freq: Two times a day (BID) | INTRAVENOUS | Status: DC
Start: 2022-07-01 — End: 2022-07-01

## 2022-07-01 MED ORDER — DEXTROSE 10 % IV BOLUS
12.5000 g | INTRAVENOUS | Status: DC | PRN
Start: 2022-07-01 — End: 2022-07-01

## 2022-07-01 MED ORDER — MELATONIN 3 MG PO TABS
3.0000 mg | ORAL_TABLET | Freq: Every evening | ORAL | Status: DC | PRN
Start: 2022-07-01 — End: 2022-07-01

## 2022-07-01 MED ORDER — POTASSIUM CHLORIDE 10 MEQ/100ML IV SOLN
10.0000 meq | INTRAVENOUS | Status: DC | PRN
Start: 2022-07-01 — End: 2022-07-01

## 2022-07-01 MED ORDER — ALUM & MAG HYDROXIDE-SIMETH 200-200-20 MG/5ML PO SUSP
30.0000 mL | ORAL | Status: DC | PRN
Start: 2022-07-01 — End: 2022-10-30

## 2022-07-01 MED ORDER — SALINE SPRAY 0.65 % NA SOLN
2.0000 | NASAL | Status: DC | PRN
Start: 2022-07-01 — End: 2022-07-01

## 2022-07-01 MED ORDER — GLUCOSE 40 % PO GEL (WRAP)
15.0000 g | ORAL | Status: DC | PRN
Start: 2022-07-01 — End: 2022-07-01

## 2022-07-01 MED ORDER — BENZOCAINE-MENTHOL MT LOZG (WRAP)
1.0000 | LOZENGE | OROMUCOSAL | Status: DC | PRN
Start: 2022-07-01 — End: 2022-07-01

## 2022-07-01 MED ORDER — LORAZEPAM 1 MG PO TABS
1.0000 mg | ORAL_TABLET | Freq: Once | ORAL | Status: AC
Start: 2022-07-01 — End: 2022-07-01
  Administered 2022-07-01: 1 mg via ORAL
  Filled 2022-07-01: qty 1

## 2022-07-01 MED ORDER — CARBOXYMETHYLCELLULOSE SOD PF 0.5 % OP SOLN
1.0000 [drp] | Freq: Three times a day (TID) | OPHTHALMIC | Status: DC | PRN
Start: 2022-07-01 — End: 2022-07-01

## 2022-07-01 MED ORDER — DEXTROSE 50 % IV SOLN
12.5000 g | INTRAVENOUS | Status: DC | PRN
Start: 2022-07-01 — End: 2022-07-01

## 2022-07-01 NOTE — UM Notes (Addendum)
[facility] Utilization Review  [address]  [place], [place] [address]  NPI # [id number]  Tax ID [id number]  Please call  [phone] with any questions or concerns. Confidential Voice Mail  Fax final authorization and request for additional information to [phone]    Ordered   Start   07/01/22 0534  Adult Admit to Observation  Once        Diagnosis: Chest Pain, Unspecified  Level of Care: Acute  Patient Class: Observation         PATIENT NAME: [name] [name]   DOB: 12/30/1975       Observation  admission on 4/20 for Chest Pain    On 4/19  patient to ED:  47 y.o. female who presents with chest pain, shortness of breath.  Started while sitting on couch.  Radiates to back into the jaw.  No history of similar chest pain.  Does note a history of GERD but not similar sensation.  No recent exertional symptoms.  Notes family history of MI at age 46 ( father)    VS: 97.6 HR 51-64 RR 12-18 Bp 230/129 157/74 Sat 92-100% wt 135 kg    Labs: Ak Phos 130 D-Dimer 0.90 trop Neg x3 WBC 9.55 Plt 131    EKG  SINUS RHYTHM FREQUENT PREMATURE VENTRICULAR COMPLEXES   PROLONGED QT   ABNORMAL ECG     Rad: MRA Chest WO Contrast    Result Date: 07/01/2022    1. Limited study secondary to an incomplete examination as the patient refused to complete the full study. 2. Within this limitation, there is no evidence of dissection of the thoracic aorta. 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 Limited unenhanced study with no acute abnormality. 2. Unchanged basilar lung scarring. [name] Numbers, MD 06/30/2022 10:30 PM    XR Chest 2 Views    Result Date: 06/30/2022  1. No acute cardiopulmonary processes. [name] Linea, MD 06/30/2022 9:30 PM       MD assessment and Plan:    Assessment:   # Chest pain  # Hypertensive urgency  # History of GERD  # History of CVA  # Obesity     Plan:   -Admit to internal medicine  -Monitor  serial troponin  -MRA to rule out dissection in the emergency department however patient was unable to complete the study  -Discussed with CT surgery by emergency department who recommended repeat MRI with sedation or TEE.  -Chest pain now resolved  -Will restart home hydralazine, hydrochlorothiazide, monitor blood pressure closely  -If chest pain returns and suspicion for dissection may consider repeat MRI.  Will continue to monitor for now   -Famotidine twice daily for GERD  -Restart home aspirin, statin.    Orders:  Scheduled Meds:  Current Facility-Administered Medications   Medication Dose Route Frequency    enoxaparin  40 mg Subcutaneous Daily    famotidine  20 mg Oral Q12H [facility]    Or    famotidine  20 mg Intravenous Q12H [facility]

## 2022-07-01 NOTE — ED to IP RN Note (Signed)
[facility] HOSPITAL EMERGENCY DEPT  ED NURSING NOTE FOR THE RECEIVING INPATIENT NURSE   ED NURSE Maciej Schweitzer    [phone]   ED CHARGE RN Cait   ADMISSION INFORMATION   [name] [name] is a 47 y.o. female admitted with an ED diagnosis of:    1. Chest pain, unspecified         Isolation: None   Allergies: Contrast [iodinated contrast media], Fioricet [butalbital-apap-caffeine], Flexeril [cyclobenzaprine], Bentyl [dicyclomine], Fentanyl, Motrin [ibuprofen], Percocet [oxycodone-acetaminophen], Shellfish-derived products, Toradol [ketorolac tromethamine], Tramadol, and Tylenol [acetaminophen]   Holding Orders confirmed? N/A   Belongings Documented? No   Home medications sent to pharmacy confirmed? N/A   NURSING CARE   Patient Comes From:   Mental Status: Home Independent  alert and oriented   ADL: Independent with all ADLs   Ambulation: no difficulty   Pertinent Information  and Safety Concerns:     Broset Violence Risk Level: Low [name] [name] is a 47 y.o. female who presents with chest pain, shortness of breath.  Started while sitting on couch.  Radiates to back into the jaw.  No history of similar chest pain.  Does note a history of GERD but not similar sensation.  No recent exertional symptoms.  Notes family history of MI at age 27 and her father.  History of hypertension, no smoking.  No leg swelling.  Patient is alert, oriented.  18G in left AC Sono-placed.   VS improved from earlier when patient ++ hypertensive.  Pain not well controlled with meds given to date.       CT / NIH   CT Head ordered on this patient?  No   NIH/Dysphagia assessment done prior to admission? N/A   VITAL SIGNS (at the time of this note)      Vitals:    07/01/22 0530   BP: 145/71   Pulse: 61   Resp: 15   Temp:    SpO2: 92%     Pain Score: 6-moderate pain (07/01/22 0432)

## 2022-07-01 NOTE — H&P (Addendum)
ADMISSION HISTORY AND PHYSICAL EXAM    Date Time: 07/01/22 6:27 AM  Patient Name: [name] [name]  Attending Physician: Larena Sox, MD  Primary Care Physician: Pcp, None, MD    CC: Chest pain      Assessment:   # Chest pain  # Hypertensive urgency  # History of GERD  # History of CVA  # Obesity    Plan:   -Admit to internal medicine  -Monitor serial troponin  -MRA to rule out dissection in the emergency department however patient was unable to complete the study  -Discussed with CT surgery by emergency department who recommended repeat MRI with sedation or TEE.  -Chest pain now resolved  -Will restart home hydralazine, hydrochlorothiazide, monitor blood pressure closely  -If chest pain returns and suspicion for dissection may consider repeat MRI.  Will continue to monitor for now   -Famotidine twice daily for GERD  -Restart home aspirin, statin.    Additional Diagnoses:     Patient has BMI=Body mass index is 42.9 kg/m.  Diagnosis: Obesity Class 3 (formerly known as Morbid Obesity) based on BMI criteria      Recent Labs     06/30/22  2240   Platelets 131*     Diagnosis: Mild Thrombocytopenia                Disposition: (Please see PAF column for Expected D/C Date)   Today's date: 07/01/2022  Admit Date: 06/30/2022  9:36 PM  Service status: Observation    History of Presenting Illness:   [name] [name] is a 47 y.o. female who presents to the hospital with with substernal chest pain, pressure-like, radiating to the back.  Patient was recently seen in the emergency department.  [facility].  She presented with similar symptoms, workup was negative, her most recent echocardiogram showed a EF of 50 to 55% in January 2024.  Patient also had a VQ scan on 04/01/2022 which was normal.  Her chest pain in the emergency department resolved.  Patient has a partially completed MRA which was nondiagnostic however did not show any aortic dissection based on the limited study.    Past Medical History:     Past Medical History:    Diagnosis Date    Hypercholesteremia     Hypertension     Seizures     Stroke     2015 and 2017, right side    TIA (transient ischemic attack) 2017       Available old records reviewed, including: Previous echocardiogram, VQ scan    Past Surgical History:     Past Surgical History:   Procedure Laterality Date    APPENDECTOMY (OPEN)      CHOLECYSTECTOMY      EGD, BIOPSY N/A 04/16/2017    Procedure: EGD, BIOPSY;  Surgeon: Pershing Proud, MD;  Location: [facility];  Service: Gastroenterology;  Laterality: N/A;    HYSTERECTOMY         Family History:   Reviewed not pertinent to current admission    Social History:     Social History     Tobacco Use   Smoking Status Never   Smokeless Tobacco Never     Social History     Substance and Sexual Activity   Alcohol Use No     Social History     Substance and Sexual Activity   Drug Use No       Allergies:     Allergies   Allergen Reactions    Contrast [Iodinated  Contrast Media] Anaphylaxis     Patient woke up in ICU after having CT with contrast, last remembers being in CT    Fioricet [Butalbital-Apap-Caffeine] Hives    Flexeril [Cyclobenzaprine] Edema     Causes throat swelling per Pt report    Bentyl [Dicyclomine] Edema    Fentanyl     Motrin [Ibuprofen] Swelling    Percocet [Oxycodone-Acetaminophen]     Shellfish-Derived Products     Toradol [Ketorolac Tromethamine]     Tramadol     Tylenol [Acetaminophen] Hives       Medications:     Home Medications               atorvastatin (LIPITOR) 40 MG tablet     Take 1 tablet (40 mg total) by mouth daily     hydrALAZINE (APRESOLINE) 25 MG tablet     Take 1 tablet (25 mg) by mouth daily     hydroCHLOROthiazide (HYDRODIURIL) 25 MG tablet     Take 1 tablet (25 mg) by mouth daily     LevETIRAcetam (KEPPRA PO)     Take 500 mg by mouth 2 (two) times daily.        pantoprazole (PROTONIX) 40 MG tablet     Take 1 tablet (40 mg total) by mouth daily                                  Method by which medications were confirmed on  admission: Personal interview    Review of Systems:   All other systems were reviewed and are negative except: Pertinent positives per HPI    Physical Exam:   Patient Vitals for the past 24 hrs:   BP Temp Temp src Pulse Resp SpO2 Height Weight   07/01/22 0530 145/71 -- -- 61 15 92 % -- --   07/01/22 0432 148/70 -- -- 60 15 94 % -- --   07/01/22 0336 143/68 -- -- 66 18 94 % -- --   07/01/22 0330 143/68 -- -- 65 16 94 % -- --   07/01/22 0300 -- -- -- 67 15 92 % -- --   07/01/22 0230 -- -- -- 63 17 93 % -- --   07/01/22 0200 157/74 -- -- 65 -- 92 % -- --   06/30/22 2341 150/70 -- -- -- -- -- -- --   06/30/22 2330 (!) 204/84 -- -- (!) 51 15 94 % -- --   06/30/22 2322 (!) 213/96 -- -- (!) 52 12 92 % -- --   06/30/22 2147 (!) 230/129 -- -- 60 14 99 % -- --   06/30/22 2100 (!) 169/135 97.6 F (36.4 C) Temporal 64 18 100 % 1.778 m (5\' 10" ) 135.6 kg (299 lb)     Body mass index is 42.9 kg/m.  No intake or output data in the 24 hours ending 07/01/22 0627    General: awake, alert, oriented x 3; no acute distress.  HEENT: perrla, eomi, sclera anicteric  oropharynx clear without lesions, mucous membranes moist  Neck: supple, no lymphadenopathy, no thyromegaly, no JVD, no carotid bruits  Cardiovascular: regular rate and rhythm, no murmurs, rubs or gallops  Lungs: clear to auscultation bilaterally, without wheezing, rhonchi, or rales  Abdomen: soft, non-tender, non-distended; no palpable masses, no hepatosplenomegaly, normoactive bowel sounds, no rebound or guarding  Extremities: no clubbing, cyanosis, or edema  Neuro: cranial nerves grossly intact, strength 5/5 in upper  and lower extremities, sensation intact  Skin: no rashes or lesions noted        Labs:     Results       Procedure Component Value Units Date/Time    High Sensitivity Troponin-I [[id number]] Collected: 07/01/22 0219    Specimen: Blood Updated: 07/01/22 0315     hs Troponin-I <2.7 ng/L     CBC and differential [[id number]]  (Abnormal) Collected: 06/30/22 2240     Specimen: Blood Updated: 06/30/22 2314     WBC 9.55 x10 3/uL      Hgb 13.2 g/dL      Hematocrit 78.2 %      Platelets 131 x10 3/uL      RBC 4.27 x10 6/uL      MCV 94.8 fL      MCH 30.9 pg      MCHC 32.6 g/dL      RDW 13 %      MPV 13.8 fL      Instrument Absolute Neutrophil Count 5.58 x10 3/uL      Neutrophils 58.4 %      Lymphocytes Automated 32.9 %      Monocytes 6.2 %      Eosinophils Automated 1.8 %      Basophils Automated 0.5 %      Immature Granulocytes 0.2 %      Nucleated RBC 0.0 /100 WBC      Neutrophils Absolute 5.58 x10 3/uL      Lymphocytes Absolute Automated 3.14 x10 3/uL      Monocytes Absolute Automated 0.59 x10 3/uL      Eosinophils Absolute Automated 0.17 x10 3/uL      Basophils Absolute Automated 0.05 x10 3/uL      Immature Granulocytes Absolute 0.02 x10 3/uL      Absolute NRBC 0.00 x10 3/uL     High Sensitivity Troponin-I [[id number]] Collected: 06/30/22 2155    Specimen: Blood Updated: 06/30/22 2242     hs Troponin-I 2.9 ng/L     Comprehensive metabolic panel [[id number]]  (Abnormal) Collected: 06/30/22 2155    Specimen: Blood Updated: 06/30/22 2234     Glucose 90 mg/dL      BUN 52.8 mg/dL      Creatinine 0.9 mg/dL      Sodium 413 mEq/L      Potassium 3.6 mEq/L      Chloride 106 mEq/L      CO2 25 mEq/L      Calcium 9.6 mg/dL      Protein, Total 9.1 g/dL      Albumin 4.2 g/dL      AST (SGOT) 27 U/L      ALT 26 U/L      Alkaline Phosphatase 130 U/L      Bilirubin, Total 0.9 mg/dL      Globulin 4.9 g/dL      Albumin/Globulin Ratio 0.9     Anion Gap 11.0     eGFR >60.0 mL/min/1.73 m2     D-Dimer [[id number]]  (Abnormal) Collected: 06/30/22 2155     Updated: 06/30/22 2229     D-Dimer 0.90 ug/mL FEU             Imaging personally reviewed, including: MRA limited    Safety Checklist  DVT prophylaxis:  CHEST guideline (See page [id number]) Chemical   Foley:  [place] Rn Foley protocol Not present   IVs:  Peripheral IV   PT/OT: Not needed   Daily CBC & or Chem ordered:  SHM/ABIM guidelines (  see #5) No       Signed  by: Sharmaine Base, MD  cc:Pcp, None, MD

## 2022-07-01 NOTE — Progress Notes (Addendum)
NURSING ADMISSION NOTE     Date Time: 07/01/22 1610  Patient Name: [name] [name]  Attending Physician: Dr. Noland Fordyce, MD     Date of Admission:   07/01/22     Reason for Admission:   Chest Pain     Admitted from:   ED     Nursing Note:   Patient Aox4. English speaking, and VSS. On tele, SB, confirmed with [facility], and alerted Public relations account executive. Oriented patient to the floor and POC. Bed to low position. Call bell within reach.      Patient scores as low/mod/high: HIGH d/t dizziness. Patient ambulates: SBA.  Bed alarm on and floor mats in place. Patient belongings reviewed. Patient reports pain as 7/10 midsternal chest pressure that radiates to the back.  Safety interventions in place.    4 eyes in 4 hours pressure injury assessment note:      Completed with: [place]  Unit & Time admitted: [id number]             Bony Prominences: Check appropriate box; if wound is present enter wound assessment in LDA     Occiput:                 [x] WNL  []  Wound present  Face:                     [x] WNL  []  Wound present  Ears:                      [x] WNL  []  Wound present  Spine:                    [x] WNL  []  Wound present  Shoulders:             [x] WNL  []  Wound present  Elbows:                  [x] WNL  []  Wound present  Sacrum/coccyx:     [x] WNL  []  Wound present  Ischial Tuberosity:  [x] WNL  []  Wound present  Trochanter/Hip:      [x] WNL  []  Wound present  Knees:                   [x] WNL  []  Wound present  Ankles:                   [x] WNL  []  Wound present  Heels:                    [x] WNL  []  Wound present  Other pressure areas:  []  Wound location       Device related: []  Device name:         LDA completed if wound present: no  Consult WOCN if necessary    Other skin related issues, ie tears, rash, etc, document in Integumentary flowsheet

## 2022-07-01 NOTE — Progress Notes (Signed)
Patient ordered discharged to home. Patient Alert and oriented x4. VSS WNL. RA. Peripheral IV removed. Patient educated and instructed on discharge follow up, medications and instructions post discharge. Patient verbalized an understanding. Patient transported home by private automobile driven by family. Patient transported off unit via wheelchair by patient transport.

## 2022-07-01 NOTE — Plan of Care (Signed)
Problem: Pain interferes with ability to perform ADL  Goal: Pain at adequate level as identified by patient  07/01/2022 1451 by Hassie Bruce, RN  Outcome: Adequate for Discharge  07/01/2022 1015 by Hassie Bruce, RN  Outcome: Progressing  Flowsheets (Taken 07/01/2022 1015)  Pain at adequate level as identified by patient:   Identify patient comfort function goal   Assess pain on admission, during daily assessment and/or before any "as needed" intervention(s)   Reassess pain within 30-60 minutes of any procedure/intervention, per Pain Assessment, Intervention, Reassessment (AIR) Cycle   Evaluate if patient comfort function goal is met   Evaluate patient's satisfaction with pain management progress   Include patient/patient care companion in decisions related to pain management as needed     Problem: Side Effects from Pain Analgesia  Goal: Patient will experience minimal side effects of analgesic therapy  07/01/2022 1451 by Hassie Bruce, RN  Outcome: Adequate for Discharge  07/01/2022 1015 by Hassie Bruce, RN  Outcome: Progressing  Flowsheets (Taken 07/01/2022 1015)  Patient will experience minimal side effects of analgesic therapy:   Monitor/assess patient's respiratory status (RR depth, effort, breath sounds)   Assess for changes in cognitive function   Prevent/manage side effects per LIP orders (i.e. nausea, vomiting, pruritus, constipation, urinary retention, etc.)   Evaluate for opioid-induced sedation with appropriate assessment tool (i.e. POSS)     Problem: Compromised Activity/Mobility  Goal: Activity/Mobility Interventions  07/01/2022 1451 by Hassie Bruce, RN  Outcome: Adequate for Discharge  07/01/2022 1015 by Hassie Bruce, RN  Outcome: Progressing  Flowsheets (Taken 07/01/2022 0800)  Activity/Mobility Interventions: Pad bony prominences, TAP Seated positioning system when OOB, Promote PMP, Reposition q 2 hrs / turn clock, Offload heels     Problem: Hemodynamic Status: Cardiac  Goal: Stable vital signs and fluid  balance  07/01/2022 1451 by Hassie Bruce, RN  Outcome: Adequate for Discharge  07/01/2022 1015 by Hassie Bruce, RN  Outcome: Progressing  Flowsheets (Taken 07/01/2022 1015)  Stable vital signs and fluid balance:   Assess signs and symptoms associated with cardiac rhythm changes   Monitor lab values     Problem: Inadequate Tissue Perfusion  Goal: Adequate tissue perfusion will be maintained  07/01/2022 1451 by Hassie Bruce, RN  Outcome: Adequate for Discharge  07/01/2022 1015 by Hassie Bruce, RN  Outcome: Progressing  Flowsheets (Taken 07/01/2022 1015)  Adequate tissue perfusion will be maintained:   Monitor/assess lab values and report abnormal values   Monitor/assess neurovascular status (pulses, capillary refill, pain, paresthesia, paralysis, presence of edema)   Monitor/assess for signs of VTE (edema of calf/thigh redness, pain)   Monitor for signs and symptoms of a pulmonary embolism (dyspnea, tachypnea, tachycardia, confusion)   Encourage/assist patient as needed to turn, cough, and perform deep breathing every 2 hours     Problem: Ineffective Gas Exchange  Goal: Effective breathing pattern  07/01/2022 1451 by Hassie Bruce, RN  Outcome: Adequate for Discharge  07/01/2022 1015 by Hassie Bruce, RN  Outcome: Progressing  Flowsheets (Taken 07/01/2022 1015)  Effective breathing pattern:   Maintain CO2 level per LIP order   Teach/reinforce use of ordered respiratory interventions (ie. CPAP, BiPAP, Incentive Spirometer, Acapella)   Monitor end tidal CO2 level per LIP order

## 2022-07-01 NOTE — Plan of Care (Signed)
Problem: Pain interferes with ability to perform ADL  Goal: Pain at adequate level as identified by patient  Outcome: Progressing  Flowsheets (Taken 07/01/2022 1015)  Pain at adequate level as identified by patient:   Identify patient comfort function goal   Assess pain on admission, during daily assessment and/or before any "as needed" intervention(s)   Reassess pain within 30-60 minutes of any procedure/intervention, per Pain Assessment, Intervention, Reassessment (AIR) Cycle   Evaluate if patient comfort function goal is met   Evaluate patient's satisfaction with pain management progress   Include patient/patient care companion in decisions related to pain management as needed     Problem: Side Effects from Pain Analgesia  Goal: Patient will experience minimal side effects of analgesic therapy  Outcome: Progressing  Flowsheets (Taken 07/01/2022 1015)  Patient will experience minimal side effects of analgesic therapy:   Monitor/assess patient's respiratory status (RR depth, effort, breath sounds)   Assess for changes in cognitive function   Prevent/manage side effects per LIP orders (i.e. nausea, vomiting, pruritus, constipation, urinary retention, etc.)   Evaluate for opioid-induced sedation with appropriate assessment tool (i.e. POSS)     Problem: Compromised Activity/Mobility  Goal: Activity/Mobility Interventions  Outcome: Progressing  Flowsheets (Taken 07/01/2022 0800)  Activity/Mobility Interventions: Pad bony prominences, TAP Seated positioning system when OOB, Promote PMP, Reposition q 2 hrs / turn clock, Offload heels     Problem: Hemodynamic Status: Cardiac  Goal: Stable vital signs and fluid balance  Outcome: Progressing  Flowsheets (Taken 07/01/2022 1015)  Stable vital signs and fluid balance:   Assess signs and symptoms associated with cardiac rhythm changes   Monitor lab values     Problem: Inadequate Tissue Perfusion  Goal: Adequate tissue perfusion will be maintained  Outcome: Progressing  Flowsheets  (Taken 07/01/2022 1015)  Adequate tissue perfusion will be maintained:   Monitor/assess lab values and report abnormal values   Monitor/assess neurovascular status (pulses, capillary refill, pain, paresthesia, paralysis, presence of edema)   Monitor/assess for signs of VTE (edema of calf/thigh redness, pain)   Monitor for signs and symptoms of a pulmonary embolism (dyspnea, tachypnea, tachycardia, confusion)   Encourage/assist patient as needed to turn, cough, and perform deep breathing every 2 hours     Problem: Ineffective Gas Exchange  Goal: Effective breathing pattern  Outcome: Progressing  Flowsheets (Taken 07/01/2022 1015)  Effective breathing pattern:   Maintain CO2 level per LIP order   Teach/reinforce use of ordered respiratory interventions (ie. CPAP, BiPAP, Incentive Spirometer, Acapella)   Monitor end tidal CO2 level per LIP order

## 2022-07-01 NOTE — ED Notes (Signed)
Pt SpO2 90-92%.  Denies home O2 use, no CPAP. Patient placed on 2L NC

## 2022-07-01 NOTE — Progress Notes (Signed)
07/01/22 0340   Case Management Quick Doc   Acknowledgment of Outpatient/Observation Observation letter given   CMA Tasks   CMA tasks MOON delivered     340pm attempted pt d/c, will mail.

## 2022-07-01 NOTE — Discharge Summary (Signed)
MEDICINE DISCHARGE SUMMARY    Date Time  07/01/22 6:24 PM   Patient Name  [name] [name], [name] [name]  1975-11-10   MRN  [id number]   Attending Physician:  No att. providers found   Primary Care Physician  Pcp, None, MD     Date of Admission:   06/30/2022  Date of Discharge:    07/01/22   Discharge Dx:   Principal Diagnosis (Diagnosis after study, that is chiefly responsible for admission to observation status): Chest pain, unspecified  #Atypical chest pain-ACS ruled out  #Hypertensive urgency-resolved without intervention  #Hx obesity-BMI 42.9  #CVA  #GERD  #Cervical cancer  #HLD  #HTN  #Seizure d/o  #Hx of multiple ED visits/admission for chest pain and stroke like symptoms with work up negative            Discharge Medications:     Medication List        START taking these medications      alum & mag hydroxide-simethicone 200-200-20 mg/5 mL suspension  Commonly known as: MAALOX PLUS  Take 30 mLs by mouth every 4 (four) hours as needed for Indigestion            CONTINUE taking these medications      atorvastatin 40 MG tablet  Commonly known as: LIPITOR  Take 1 tablet (40 mg total) by mouth daily     hydrALAZINE 25 MG tablet  Commonly known as: APRESOLINE     hydroCHLOROthiazide 25 MG tablet  Commonly known as: HYDRODIURIL     KEPPRA PO     pantoprazole 40 MG tablet  Commonly known as: PROTONIX  Take 1 tablet (40 mg total) by mouth daily               Where to Get Your Medications        Information about where to get these medications is not yet available    Ask your nurse or doctor about these medications  alum & mag hydroxide-simethicone 200-200-20 mg/5 mL suspension            Consultations:    Treatment Team:   Farrel Demark, MD  Gittinger, Duanne Moron, RN   Laboratory Data:   Recent Labs   Lab 06/30/22  2240   WBC 9.55*   Hgb 13.2   Hematocrit 40.5   Platelets 131*     Recent Labs   Lab 06/30/22  2155   Sodium 142   Potassium 3.6   Chloride 106   CO2 25   BUN 11.0   Creatinine 0.9   eGFR >60.0   Glucose  90   Calcium 9.6               Invalid input(s): "FREET4"   No results found for: "T4". Recent Labs   Lab 07/01/22  1044 07/01/22  0219 06/30/22  2155   hs Troponin-I <2.7 <2.7 2.9                   Hospital Course:   [name] [name] is a 47 y.o. female was admitted under observation for substernal chest pain associated with shortness of breath while sitting on the couch.  Patient reported that it radiated to her jaw and back.  The patient has had multiple admissions at different facilities for chest pain workup.  Echo in March 2024 returned stable, VQ scan for elevated D-dimer was negative PE in January 2024.  Upon admission to the ED her BP  was 234/129--resolved on its own.  Initial workup included: WBC 9.55, platelet 131, ALP 130, serial troponin levels negative, D-dimer 0.90 (chronically elevated), chest x-ray negative for acute process, CT chest negative, MRA chest ordered for chest pain returned without evidence of dissection although limited study. 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  The patient received multiple doses of IV Dilaudid in the ED and was admitted to the floor.  She was then discharged home due to negative cardiac workup, ruled out ACS.  The patient stated that she had been evaluated by cardiology in the past but was lost to follow-up.  Was recommended that she establish care with a PCP and cardiology for ongoing outpatient workup.    Prior to admission from ED, patient's extensive medical record and work up should be reviewed.  She has had extensive work up in multiple hospitals all of which have been negative and likely her symptoms warrant outpatient management rather than recurrent admissions.    D/C: Home today  Time spent coordinating care:30 minutes    Currently the patient is hemodynamically stable for discharge with outpatient follow up as outlined below.  Discharge vitals   BP 139/76   Pulse (!) 53   Temp 97.9 F (36.6 C) (Oral)   Resp 16   Ht 1.778  m (5\' 10" )   Wt 135.6 kg (299 lb)   LMP 10/15/2018 (Exact Date)   SpO2 93%   BMI 42.90 kg/m  Body mass index is 42.9 kg/m.  General: awake, alert, oriented x 3; no acute distress.  Cardiovascular: regular rate and rhythm, no murmurs, rubs or gallops  Lungs: clear to auscultation bilaterally, without wheezing, rhonchi, or rales    Discharge Condition & Coordination:     Condition: Stable    Coordination  No coordination needed    Emergency Contact  Extended Emergency Contact Information  Primary Emergency Contact: Harper,Patrick   [place]  Mobile Phone: [phone]  Relation: Significant Other    Discharge Instructions:   Diet:Cardiac Diet  Activity: activity as tolerated  Disposition:  home    -- Schedule a follow up visit with your primary care provider - within 1 week of discharge from the hospital.  -- If you do not have a PCP, you may follow-up with the [facility] clinic for reevaluation.   They will see you regardless of insurance.  -- Follow up with Cardiology in 2-3 weeks. Information for a local group is provided in the packet if you are not followed by cardiology already  -- Follow up with your neurology group for your seizure management     -- NEW: Start Lidoderm patches (over the counter) for pain control  --You can take maalox if need for chest pain related to food/drinks  -- Resume your home medications, no changes were made.  -- Check and Record blood pressure and pulse twice daily and when not feeling well, take log with you to primary care visit/follow up.  -- For mild to moderate pain, you may use OTC Tylenol.       -- Hydrate well, activity as tolerated.     -- Return to the nearest emergency department for progressive chest pain, shortness of breath, fever >100.5 degrees, dizziness, passing out, profuse vomiting or diarrhea, or any other concerning symptoms      Follow-up Information       [facility] Follow up in 1  week(s).  Specialty: Family Medicine  Contact information:  [address]  [place] [address]  [phone]  Additional information:  From the [place]:   Take [address]. Take exit [address] to merge onto [address] toward [place].    Turn right onto [address]. Your destination will be on the left.       From the [place]:   Take [address] towards [address]. Merge onto [address]. Take exit 50-A to merge onto [address]/   [facility] toward [address]. Turn right onto [address]. Your destination will be on the left.              [facility] [place] Follow up in 2 week(s).    Specialty: Cardiology  Contact information:  [address]  [address]  [place] [address]  [phone]  Additional information:  From [address]   Go [place] on [address].    Turn left onto [address]                          Leonides Schanz by: Melany Guernsey, FNP  [facility]   ADULT OBSERVATION UNIT [phone] F: [phone]    CC: Pcp, None, MD  Please see attending note that follows this mid-level encounter note.

## 2022-07-01 NOTE — Discharge Instr - AVS First Page (Addendum)
Reason for your Hospital Admission:  Chest Pain--Negative work up for serious cardiac/lung cause.     Instructions for after your discharge:  -- Schedule a follow up visit with your primary care provider - within 1 week of discharge from the hospital.  -- If you do not have a PCP, you may follow-up with the [facility] clinic for reevaluation.   They will see you regardless of insurance.  -- Follow up with Cardiology in 2-3 weeks. Information for a local group is provided in the packet if you are not followed by cardiology already  -- Follow up with your neurology group for your seizure management    -- NEW: Start Lidoderm patches (over the counter) for pain control  --You can take maalox if need for chest pain related to food/drinks  -- Resume your home medications, no changes were made.  -- Check and Record blood pressure and pulse twice daily and when not feeling well, take log with you to primary care visit/follow up.  -- For mild to moderate pain, you may use OTC Tylenol.      -- Hydrate well, activity as tolerated.    -- Return to the nearest emergency department for progressive chest pain, shortness of breath, fever >100.5 degrees, dizziness, passing out, profuse vomiting or diarrhea, or any other concerning symptoms

## 2022-07-05 NOTE — Progress Notes (Signed)
AMBULATORY CARE MANAGEMENT Outreach Call    Hospital patient admitted to: [facility] Admission/Discharge Dates:   4/19- 07/01/2022     Reason for admission to hospital: atypical chest pain    Name: [name] Cook    ### Patient Details  Date of Birth: Sep 21, 1975  MRN: [id number]    ### Encounter Details  Arrival Date: 06/30/2022 09:36 PM EDT  Discharge Date: 07/01/2022 03:23 PM EDT  Encounter ID: [id number] TCM4/20/2024   3:23:00PM    ### Related interaction  [facility] TCM Post Discharge Outreach (Post Discharge TCM Outreach 1) ([email or web address])    ### Required Interventions and Feedback     Call Status         Call Status:     No Attempt (edited by JF on 07/05/2022 08:35 AM EDT)    Comments::     Patient received an automated post-discharge outreach call.  Person accepting call hung up.   No further action needed at this time.   (edited by JF on 07/05/2022 08:35 AM EDT)      Rudolpho Sevin, RN, BSN, ACM-RN  Case Manager II  [facility]  [address]  [place], [place] [address]  [phone]) [phone]

## 2022-07-15 NOTE — ED Provider Notes (Signed)
Patient Evaluated: 07/15/2022 - 19:06 - [facility] 05    Means of arrival:Car        CC / HPI                                                                                                                                                                           Chief Complaint from Triage Note:   "Patient presents with:  Facial Droop  Pt states hasn't been feeling well the past 4 days. Pt c/o of dizziness, blurry vision and headaches. Double vision started two days ago. Pt states when she went to bed was having elevated pressure. Even after taking blood pressure medication. When pt woke up this morning 10 am noticed drooping right face. Last known well was 10 pm. Pmhx of stroke in 2015, Seizures, HBP, Cervical cancer, and TIA. "      HPI:  [name] Cook is a 47 y.o. female with multiple medical problems including CVA 2015, no residual deficits, states that she has not been feeling well for the last 4 days, she has been experiencing blurry vision, double vision, dizziness, headaches, double vision only started 2 days ago, and then she started having generalized feeling of being unwell over the past day.  At night she went to bed and she was feeling fine neurologically, however when she woke up in the morning at 10 AM she noticed that she had right-sided facial droop.  Her last known well, is 10 PM. Also complains of moderate-mild chest pain in the left chest that radiates to the L shoulder and neck that has been present for the last 4 days, no variation with position or breathing.   On arrival, NIH 2 for facial droop, remainder is unremarkable.   Patient currently denies f/c, HA, changes in vision, hearing, or balance, sob, abdominal pain, n/v/d/c, hematochezia, melena, dysuria, hematuria, rashes, difficulty with ambulation. Denies sick contacts or recent travel.          Relevant Past Medical, Social and Family History - See resident note (if applicable) for additional historical information   Past Medical / Surgical  History:   Past Medical History:   Diagnosis Date   . Cancer (CMS/HCC)    . Cervical cancer (CMS/HCC)     s/p TAH by Dr. Hillary Bow   . CVA (cerebral vascular accident) (CMS/HCC) 2015    After HTN emergency; recovered    . HLD (hyperlipidemia)    . HTN (hypertension)    . Hypertension 12/15/1992   . Morbid obesity with BMI of 40.0-44.9, adult (CMS/HCC)    . Seizure disorder (CMS/HCC)    . 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 TIA (transient ischemic attack)  Past Surgical History:   Procedure Laterality Date   . COLPORRHAPHY N/A 06/08/2020    POSTERIOR REPAIR performed by Harvest Forest, MD at [facility] MAIN OR   . CYSTOSCOPY N/A 06/22/2020    CYSTOSCOPY performed by Harvest Forest, MD at [facility] MAIN OR   . CYSTOSCOPY N/A 08/04/2020    SLING REMOVAL,CYSTOSCOPY performed by Harvest Forest, MD at [facility] MAIN OR   . CYSTOSCOPY BIOPSY BLADDER N/A 06/22/2020    CYSTOSCOPY BIOPSY BLADDER performed by Harvest Forest, MD at [facility] MAIN OR   . ENTERSCOPY N/A 05/17/2020    PUSH ENTEROSCOPY performed by Epifania Gore, MD at [facility] ENDO OR   . ESOPHAGOGASTRODUODENOSCOPY Bilateral 05/17/2020    ESOPHAGOGASTRODUODENOSCOPY performed by Epifania Gore, MD at [facility] ENDO OR   . HX APPENDECTOMY  01/29/18   . HX HYSTERECTOMY     . HYSTERECTOMY ABDOMINAL  2021   . PERINEOPLASTY N/A 06/08/2020    PERINEOPLASTY performed by Harvest Forest, MD at [facility] MAIN OR   . REMOVAL / REVISION OF SLING FOR STRESS INCONTINENCE N/A 06/22/2020     EXAMINATION UNDER ANESTHESIA   performed by Harvest Forest, MD at [facility] MAIN OR   . SLING PUBOVAGINAL N/A 06/08/2020    SLING VAGINAL WITH ALTIS performed by Harvest Forest, MD at [facility] MAIN OR     Family History: family history includes Breast Cancer in her sister; Cervical Cancer in her sister; Diabetes mellitus in her father and mother; Hypertension in her father and mother.  Social Hx:  reports that she has never  smoked. She has never used smokeless tobacco. She reports that she does not currently use alcohol. She reports that she does not use drugs.   Current Outpatient Medications on File Prior to Encounter   Medication Last Dose   . amLODIPine (NORVASC) 5 MG tablet    . aspirin 325 MG EC tablet    . atorvastatin (LIPITOR) 40 MG tablet    . hydrALAZINE (APRESOLINE) 25 MG tablet    . hydroCHLOROthiazide 25 MG tablet    . levETIRAcetam (KEPPRA) 500 MG tablet    . lisinopril (ZESTRIL) 10 MG tablet    . ondansetron (ZOFRAN) 4 mg tablet      ALLERGIES: Acetaminophen, Butalbital-apap-caffeine, Cyclobenzaprine, Hydrocodone-acetaminophen, Ibuprofen, Iodinated Contrast - Iv Or Oral, Iodinated Contrast Media, Ketorolac Tromethamine, Nsaids, Oxycodone-acetaminophen, Shellfish-derived Products, Tramadol, Ceftriaxone, Ciprofloxacin, Dicyclomine, Egg-derived Products, Metoclopramide, Promethazine      Review of Systems - See HPI and resident note (if applicable)  for additional ROS Information   No LMP recorded (lmp unknown). Patient has had a hysterectomy.  Review of Systems   Constitutional:  Positive for fatigue. Negative for activity change, chills and fever.   HENT:  Negative for congestion and nosebleeds.    Respiratory:  Negative for chest tightness and shortness of breath.    Cardiovascular:  Positive for chest pain. Negative for palpitations.   Gastrointestinal:  Negative for abdominal distention and abdominal pain.   Genitourinary:  Negative for difficulty urinating and dysuria.   Musculoskeletal:  Negative for back pain and gait problem.   Skin:  Negative for color change and rash.   Neurological:  Positive for facial asymmetry and headaches. Negative for dizziness.         Physical Exam     LAST  VS BP: (!) 185/114 Pulse: 52 (07/15/22 1830)  Heart Rate (Monitored): 62 (07/15/22 1700) Resp: 18  SpO2: 98 % Temp: 37.2 C (99 F)   FIRST  VS BP: (!) 166/118 Pulse: 81 (07/15/22 1600)  Heart Rate (Monitored): 68 (07/15/22 1620)  Resp: (!) 22  SpO2: 98 % Temp: 37.2 C (99 F)   SHOCK INDEX BASED ON LATEST VITAL SIGNS (PULSE/SBP): .33  **These vitals reflect the first/last taken during this encounter. Within each group, they are not necessarily taken at the same time.    Physical Exam  Constitutional:       General: She is not in acute distress.     Appearance: Normal appearance. She is not ill-appearing.   HENT:      Head: Normocephalic and atraumatic.      Nose: Nose normal.      Mouth/Throat:      Mouth: Mucous membranes are moist.      Pharynx: Oropharynx is clear.   Eyes:      General: Visual field deficit (diplopia) present.      Extraocular Movements: Extraocular movements intact.      Conjunctiva/sclera: Conjunctivae normal.   Cardiovascular:      Rate and Rhythm: Normal rate and regular rhythm.      Pulses: Normal pulses.      Heart sounds: Normal heart sounds.   Pulmonary:      Effort: Pulmonary effort is normal. No respiratory distress.      Breath sounds: Normal breath sounds.   Abdominal:      General: Abdomen is flat. There is no distension.      Palpations: Abdomen is soft.      Tenderness: There is no abdominal tenderness.   Musculoskeletal:         General: No deformity. Normal range of motion.   Skin:     General: Skin is warm and dry.      Capillary Refill: Capillary refill takes less than 2 seconds.   Neurological:      Mental Status: She is alert and oriented to person, place, and time.      Cranial Nerves: Cranial nerve deficit and facial asymmetry present. No dysarthria.      Sensory: Sensation is intact.      Motor: Motor function is intact.      Coordination: Coordination is intact.      Gait: Gait is intact.      Comments: NIH 2 for facial droop   Psychiatric:         Mood and Affect: Mood normal.         Behavior: Behavior normal.            Diagnostic Studies   ECG Interpretation   Rate: 73  Rhythm: sinus rhythm   Axis:  normal  Intervals:     -normal PR interval      -normal QRS interval      -normal QTc interval     ST segment: normal   Other findings: Yes     -PVCs: occasional PVCs    Comparison to prior:    Comments:     Results for orders placed or performed during the hospital encounter of 07/15/22   CMP   Result Value Ref Range    Sodium 140 136 - 145 mmol/L    Potassium 3.6 3.5 - 5.1 mmol/L    Chloride 104 98 - 107 mmol/L    CO2 Total 23 22 - 29 mmol/L    Glucose Bld 80 70 - 100 mg/dL    Creatinine 0.9 0.7 - 1.2 mg/dL    BUN 14 6 - 23 mg/dL    Calcium 16.1 (  H) 8.6 - 10.2 mg/dL    Total Protein 9.0 (H) 6.6 - 8.7 g/dL    Albumin 4.6 3.5 - 5.2 g/dL    Bilirubin Total 0.5 0.1 - 1.0 mg/dL    Alk Phos 784 (H) 35 - 104 unit/L    AST 31 5 - 32 U/L    ALT 24 5 - 33 U/L    Anion Gap 14 8 - 18 mmol/L   CBC with Auto Diff   Result Value Ref Range    Platelets 158 150 - 475 10*3/uL    WBC 11.2 (H) 4.5 - 10.6 10*3/uL    RBC 5.17 (H) 3.80 - 5.10 10    Hemoglobin 15.7 (H) 12.0 - 14.0 g/dL    Hematocrit 69.6 (H) 41.0 - 47.0 %    MCV 92.3 80.0 - 99.0 fL    MCH 30.4 26.0 - 33.0 pg    MCHC 32.9 32.0 - 35.0 g/dL    MPV 29.5 28.4 - 13.2 fL    RDW 13 10 - 13 %   Troponin T 5th Gen, High Sensitivity   Result Value Ref Range    Troponin T 5th Gen, High Sensitivity <6 <=14 ng/L   Mg   Result Value Ref Range    Magnesium 2.4 1.7 - 2.6 mg/dL   PTT   Result Value Ref Range    PTT 29.7 22.0 - 36.0 sec   PT/INR   Result Value Ref Range    Protime 13.3 12.0 - 15.0 sec    INR 1.0 <=1.4   POC Glucose   Result Value Ref Range    POC Glucose 82 70 - 100 mg/dL   Automated Differential   Result Value Ref Range    Neutros% 63.2 %    Neutros Abs 7.07 1.80 - 7.90 10*3/uL    Lymphs % 28.5 %    Lymphs Abs 3.19 1.10 - 4.20 10*3/uL    Monos % 5.6 %    Monos Abs 0.63 0.00 - 1.00 10*3/uL    Eos % 2.0 %    Eosinophils Abs 0.22 0.00 - 0.50 10*3/uL    Basophil % 0.4 %    Basophil Abs 0.05 0.00 - 0.21 10*3/uL    Immature Grans (Abs) 0.03 0.00 - 0.53 10*3/uL    Immature Grans % 0.3 0.0 - 5.0 %   Sedimentation Rate   Result Value Ref Range    Sed Rate 33 (H) 0 - 20 mm/hr    HIV 1/2 Ab, P24 Ag   Result Value Ref Range    HIV 1 P24 Negative Negative    HIV-1/2 Antibody Negative Negative   Hemoglobin A1C   Result Value Ref Range    Est Avg Glucose 120 mg/dL    Hemoglobin G4W 1.02 4.80 - 5.90 %   C Reactive Protein, Cardiac   Result Value Ref Range    CRP High Sensitivity 6.0 (H) 0.5 - 5.0 mg/L   Lipid Profile   Result Value Ref Range    Cholesterol 206 (H) 120 - 200 mg/dL    Triglycerides 725 1 - 200 mg/dL    HDL 41 (L) >=36 mg/dL    LDL Calc 644.0 (H) 8.5 - 99.0 mg/dL    Chol/HDL Ratio 5.0 (H) <=3.9   Estimated GFR   Result Value Ref Range    EGFR Result 80 >=60     I independently reviewed the above laboratory studies and when relevant, compared with previous values if available.  CT Head/Brain W/O Con    Addendum Date: 07/15/2022    NOTIFICATION:  Non-critical findings were relayed by Radiology Support Staff to Dr. Dorthy Cooler on 07/15/2022 at 16:34. Thank you for this referral to [facility] [place], [employer] in partnership with [facility].  For further consultation, please call [phone] or [phone] (phones answered 24/7/365).  Electronically Signed by Demetrio Lapping 07/15/2022 4:55 PM    Result Date: 07/15/2022  INDICATION:  New facial droop, slurred speech 18 hours ago. . TECHNIQUE:  CT of the brain was performed without contrast.  Automated mA/kV exposure control was utilized and patient examination was performed in strict accordance with principles of ALARA. RADIATION AMOUNT:  317 mGy-cm. COMPARISON:  11/30/2021 CT Head/ brain. FINDINGS: There is no acute intracranial hemorrhage, midline shift, mass effect or extra-axial fluid collection.  Gray-white differentiation is preserved.  Brain volume and ventricular system are within normal limits for age.  The skull base and calvarium are intact.  The visualized paranasal sinuses are unremarkable.  The visualized orbits, globes, and mastoid air cells are unremarkable.      IMPRESSION: No acute  intracranial findings or interval change..  Thank you for this referral to [facility] [place], [employer] in partnership with [facility].  For further consultation, please call [phone] or [phone] (phones answered 24/7/365).  Electronically Signed by Demetrio Lapping 07/15/2022 4:32 PM             Progress Notes, Medical Decision Making and Critical Care          ED Course as of 07/15/22 1906   Sat Jul 15, 2022   1811 MR Brain W+WO Con  Patient does not fit in the scanner here at [facility]. Patient will have to obtain MRI at [facility].  [RD]      ED Course User Index  [RD] Anastasia Pall, MD                                                [facility].  MRI recommended by neurology.  CT head is negative.  Labs and EKG are not notable for ACS.  Patient has a significant number of allergies that also lead to anaphylaxis contrast is 1 of these, cannot obtain a CTA, patient does not feel comfortable with any sort of medications prior to this as well and she is quick to say this from the offset.    Patient unfortunately is unable to fit in the MRI machine here at [facility], will be [facility] where she will obtain the MRI there.  [facility] is [facility]     Clinical Impression and Disposition    Diagnosis:  1. Facial droop      ED Disposition: Admit - 07/15/2022 18:20  Condition at time of disposition: Stable                    Anastasia Pall, MD  07/15/22 2126

## 2022-07-16 NOTE — Nursing Note (Signed)
Neuro Un-changed; Pt resting comfortably in room. Vitals as charted.

## 2022-07-16 NOTE — Consults (Signed)
Consult Note: General Consulting Service     ALLERGIES: Acetaminophen, Butalbital-apap-caffeine, Cyclobenzaprine, Hydrocodone-acetaminophen, Ibuprofen, Iodinated Contrast - Iv Or Oral, Iodinated Contrast Media, Ketorolac Tromethamine, Nsaids, Oxycodone-acetaminophen, Shellfish-derived Products, Tramadol, Ceftriaxone, Ciprofloxacin, Dicyclomine, Egg-derived Products, Metoclopramide, Promethazine ISOLATION:  CODE STATUS: Full CodeCPR  LOS: 0     Providers Referring Physician   Treatment Team: Attending Provider: Miachel Roux., MD  No ref. provider found   Anastasia Pall, MD      HPI:  Reason for consultation: left face drooping and right side weakness.       [name] Cook is a 47 y.o. female with PMH significant for CVA, TIA, seizure disorder on Keppra, cervical cancer, HTN, and HLD who presents to the hospital as a transfer from [facility] due to feeling unwell for the last 4 days and new development of right facial droop that started at 10 AM on 07/15/2022.  She stated that her BP was not controlled, sometimes about 230 mmHg, though she took her meds on time. After arrival at [facility], she was told having right arm weakness.  When I came to see her around noon, she was lying comfortably in bed. NAD. She denied change in her facial drooping and right arm weakness.     I knew this pt well when she was admitted to [facility] in July and September 2023 for right facial drooping and right side weakness.  The brain MRI in July 2023 was negative for stroke. Neurol exam showed her facial drooping was not consistent. Her facial weakness would spontaneously improved when she was distracted during the repeated interviews.  The record at [facility] showed that she had frequent ED visit due to similar symptoms or chest pains.       History:  Problem List: has History of RAD hysterectomy/BSO; Seizure (CMS/HCC); Right hemiparesis (CMS/HCC); Essential hypertension; Malignant tumor of cervix (CMS/HCC); Morbid  (severe) obesity due to excess calories (CMS/HCC); Hemorrhoids; Chest pain; Hyperlipidemia; Chronic pain; Chest pain, unspecified type; BMI 40.0-44.9, adult (CMS/HCC); Cervical spondylosis; Facial droop; Weakness of extremity; TIA (transient ischemic attack); Right sided weakness; and Syncope, unspecified syncope type on their problem list.  Past Medical / Surgical History:  has a past medical history of Cancer (CMS/HCC), Cervical cancer (CMS/HCC), CVA (cerebral vascular accident) (CMS/HCC), HLD (hyperlipidemia), HTN (hypertension), Hypertension, Morbid obesity with BMI of 40.0-44.9, adult (CMS/HCC), Seizure disorder (CMS/HCC), Seizures (CMS/HCC), and TIA (transient ischemic attack).  has a past surgical history that includes hysterectomy abdominal (2021); hx hysterectomy; esophagogastroduodenoscopy (Bilateral, 05/17/2020); enterscopy (N/A, 05/17/2020); sling pubovaginal (N/A, 06/08/2020); colporrhaphy (N/A, 06/08/2020); perineoplasty (N/A, 06/08/2020); hx appendectomy (01/29/18); removal / revision of sling for stress incontinence (N/A, 06/22/2020); cystoscopy (N/A, 06/22/2020); cystoscopy biopsy bladder (N/A, 06/22/2020); and cystoscopy (N/A, 08/04/2020).  Family History: family history includes Breast Cancer in her sister; Cervical Cancer in her sister; Diabetes mellitus in her father and mother; Hypertension in her father and mother.  Social Hx:  reports that she has never smoked. She has never used smokeless tobacco. She reports that she does not currently use alcohol. She reports that she does not use drugs.  Home  Meds:   Home Medications    Medication Sig   amLODIPine (NORVASC) 10 MG tablet Take 10 mg by mouth daily.   atorvastatin (LIPITOR) 40 MG tablet Take 40 mg by mouth nightly.   hydrALAZINE (APRESOLINE) 25 MG tablet Take 25 mg by mouth daily.   levETIRAcetam (KEPPRA) 500 MG tablet Take 500 mg by mouth 2 times daily.  lisinopril (ZESTRIL) 10 MG tablet Take 10 mg by mouth daily.      Current Meds: .  amLODIPine,  10 mg  .  atorvastatin, 80 mg  .  clopidogrel, 75 mg  .  enoxaparin, 40 mg  .  hydrALAZINE, 25 mg  .  levETIRAcetam, 500 mg  .  lisinopril, 20 mg  .  morphine sulfate, 4 mg  .  nitroglycerin, 0.4 mg    Review of Systems   Negative except those listed in HPI.   Physical Exam:  Vital Signs:  BP 128/83   Pulse 68   Temp 36.8 C (98.2 F) (Oral)   Resp 18   Ht 172.7 cm (5\' 8" )   Wt (!) 145.2 kg (320 lb)   LMP  (LMP Unknown) Comment: HCG negative - 06/08/20 - 0713 AM  SpO2 97%   BMI 48.66 kg/m     GENERAL: Appeared as stated age and well nourished.  HEENT: Normocephalic and atraumatic. No tenderness over the head.   NECK: Supple. No JVD. No tenderness. No carotid bruits.   LUNGS: Clear to auscultation.  HEART: Regular rhythm.  ABDOMEN: Soft, nontender. Bowel sounds were present.     EXTREMITIES: Without deformity or edema.    NEUROLOGICAL EXAM:  Mental status: Lying comfortably in bed. Awake, alert, and oriented to person, place and date. Speech was clear, fluent and coherent. Content was appropriate. No receptive or expressive aphasia.  Denied depression or hallucination. NAD. Followed commands.  Cranial nerves: Bilateral pupils were round, reacting  to light. Visual fields were full by confrontation testing. No nystagmus. Extraocular movements were intact.  Facial sensation to light touch was normal  bilaterally. Typical left low face drooping with  with mouth twitching to right side but normal eye closure. Hearing was normal to rubbing fingers bilaterally. Soft palate elevation was symmetrical.  Head turning and shoulder shrug were normal. Tongue was midline with normal movements, and no atrophy, fasciculation or biting lesion.  Extremities: Normal muscle bulk. No muscle tenderness. Strength was  4/5 for right UE/LE, 5/5/for left UE/LE. Tone was normal. Sensation to  light touch was normal over all extremities.  Deep tendon reflexes were  2+ for all extremities.  Plantar reflexes: toes downgoing bilaterally.     Coordination and gait:  Right finger-nose testing was a little slow. However, bilateral rapid finger tapping was normal. No dysdiadochokinesia. No resting or postural tremor. No ataxia. Gait testing: deferred.     Diagnostic Studies:  Head CT scan w/ contrast on  07/16/2022  No acute intracranial findings or interval change..          Lipid Profile    Collection Time: 07/15/22  4:36 PM   Result Value Ref Range    Cholesterol 206 (H) 120 - 200 mg/dL    Triglycerides 147 1 - 200 mg/dL    HDL 41 (L) >=82 mg/dL    LDL Calc 956.2 (H) 8.5 - 99.0 mg/dL    Chol/HDL Ratio 5.0 (H) <=3.9   Estimated GFR    Collection Time: 07/15/22  4:36 PM   Result Value Ref Range    EGFR Result 80 >=60   Vitamin B12    Collection Time: 07/15/22  5:00 PM   Result Value Ref Range    Vitamin B 12 491 211 - 946 pg/mL   Sedimentation Rate    Collection Time: 07/15/22  5:32 PM   Result Value Ref Range    Sed Rate 33 (H) 0 - 20 mm/hr  HIV 1/2 Ab, P24 Ag    Collection Time: 07/15/22  5:32 PM   Result Value Ref Range    HIV 1 P24 Negative Negative    HIV-1/2 Antibody Negative Negative   Hemoglobin A1C    Collection Time: 07/15/22  5:32 PM   Result Value Ref Range    Est Avg Glucose 120 mg/dL    Hemoglobin Z6X 0.96 4.80 - 5.90 %     CMP    Collection Time: 07/15/22  4:36 PM   Result Value Ref Range    Sodium 140 136 - 145 mmol/L    Potassium 3.6 3.5 - 5.1 mmol/L    Chloride 104 98 - 107 mmol/L    CO2 Total 23 22 - 29 mmol/L    Glucose Bld 80 70 - 100 mg/dL    Creatinine 0.9 0.7 - 1.2 mg/dL    BUN 14 6 - 23 mg/dL    Calcium 04.5 (H) 8.6 - 10.2 mg/dL    Total Protein 9.0 (H) 6.6 - 8.7 g/dL    Albumin 4.6 3.5 - 5.2 g/dL    Bilirubin Total 0.5 0.1 - 1.0 mg/dL    Alk Phos 409 (H) 35 - 104 unit/L    AST 31 5 - 32 U/L    ALT 24 5 - 33 U/L    Anion Gap 14 8 - 18 mmol/L   CBC with Auto Diff    Collection Time: 07/15/22  4:36 PM   Result Value Ref Range    Platelets 158 150 - 475 10*3/uL    WBC 11.2 (H) 4.5 - 10.6 10*3/uL    RBC 5.17 (H) 3.80 - 5.10 10     Hemoglobin 15.7 (H) 12.0 - 14.0 g/dL    Hematocrit 81.1 (H) 41.0 - 47.0 %    MCV 92.3 80.0 - 99.0 fL    MCH 30.4 26.0 - 33.0 pg    MCHC 32.9 32.0 - 35.0 g/dL    MPV 91.4 78.2 - 95.6 fL    RDW 13 10 - 13 %     Assessment and Plan    [name] Cook is a 47 y.o. female with PMH significant for CVA, TIA, seizure disorder on Keppra, cervical cancer, HTN, and HLD who presents to the hospital as a transfer from [facility] due to feeling unwell for the last 4 days and new development of right facial droop that started at 10 AM on 07/15/2022.    Left face drooping and right ext weakness  2. H/O TIA ???  3. H/O chest pain  4. Obesity, HLD, HTN  5. H/O SZ    The neurol exam showed her left low face drooping not consistent. During the interview, I kept talking with her, and her left low face drooping much improved when she was distracted. I doubt the presence of TIA or acute stroke.     -Neurocheck Q4hrs  -Keep BP under control  -resume home meds plavix, statin  -resume home med keppra for SZ prophylaxis  -other managements per primary care team  -DVT prophylaxis  -ST, PT/OT  -Education about TIA/stroke and SZ diagnosis, treatment and prophylaxis  -follow up with neurology in 2 wks after discharge.   I will sign off now. Pls call  neurology anytime if she needs neurol reevaluation before discharge.    Thanks for giving me the opportunity to participate in the care of Ms. [name] Cook  Total time for this consultation 60 min with more than 50% time for  counseling and care coordination.

## 2022-07-16 NOTE — Nursing Note (Signed)
Patient is alert and oriented, admitted from [facility] to [facility] floor with diagnosis of R Facial drop. Patient is oriented to the floor and call light within reach. Patient placed on cardiac telemonitor. Nursing care plan continues.

## 2022-07-16 NOTE — Progress Notes (Signed)
Pt able to safely stand and self transfer to stretcher for transport to [facility]. No unsteady gait noted. Documentation sent with Transport [facility] - [address] Notified of Pt's departure.

## 2022-07-16 NOTE — H&P (Signed)
Internal Medicine  History & Physical    Chief Complaint:   Feeling unwell and facial droop at 10 AM on 07/15/2022     History of Presenting Illness:   [name] Cook is a 47 y.o. female with PMH of CVA, TIA, seizure disorder on Keppra, cervical cancer, HTN, and HLD who presents to the hospital as a transfer from [facility] due to feeling unwell for the last 4 days and new development of right facial droop that started at 10 AM on 07/15/2022.  The patient states that she frequently checks her blood pressure at home and has been noticing that the numbers have been getting higher and higher.  At some point she checked her blood pressure and the systolic was in the 230s.  She endorses to taking lisinopril 10 mg, amlodipine 10 mg and hydralazine 25 mg daily.  She last saw her PCP 2 months ago and states that her antihypertensive regimen was not revised.  She has had multiple admissions over the last several months, the most recent being on 07/01/2022 to [facility] for similar symptoms.  She denies any fevers, chills, recent sick contacts, recent immobility, recent trauma, lightheadedness, dizziness, nausea, vomiting, palpitations, diaphoresis chest pain, shortness of breath, or abdominal pain.    [facility] ED course:  Vitals T99 F, BP 166/118, HR 81, RR 22, SpO2 98%.  NIHSS in [place] was 2 for facial droop.  Labs significant for WBC 11.2, Hb 15.7, ESR 33, CRP 6.0, Ca 11.7.  Lipid profile significant for total cholesterol 206, LDL 143.  CT head/brain shows no acute intracranial findings or interval change.  EKG shows NSR with QTc of 473.  She was outside the window for TNK and therefore was not administered.  Neurology was consulted and they recommended reducing BP to less than 140/90, aspirin and Plavix now, atorvastatin 80 mg daily, ordered 2D echo, ordered MRI brain with and without contrast and MRA head and neck.  She will be admitted to medicine for further workup of hypertensive encephalopathy causing  subacute ischemic stroke.     Review of System:   Review of Systems   Constitutional:  Negative for chills, diaphoresis, fever and weight loss.   HENT:  Negative for congestion, ear discharge, ear pain, hearing loss, sinus pain and tinnitus.    Eyes:  Positive for blurred vision. Negative for double vision, photophobia, pain and discharge.   Respiratory:  Negative for cough, hemoptysis, sputum production, shortness of breath and stridor.    Cardiovascular:  Positive for chest pain. Negative for palpitations, orthopnea, claudication, leg swelling and PND.   Gastrointestinal:  Negative for abdominal pain, blood in stool, constipation, diarrhea, heartburn, melena, nausea and vomiting.   Genitourinary:  Negative for dysuria, frequency, hematuria and urgency.   Musculoskeletal:  Negative for back pain, joint pain, myalgias and neck pain.   Skin:  Negative for itching and rash.   Neurological:  Negative for dizziness, tingling, tremors, sensory change, speech change, focal weakness, seizures, weakness and headaches.   Endo/Heme/Allergies:  Does not bruise/bleed easily.   Psychiatric/Behavioral:  Negative for depression and suicidal ideas.         Past Medical and Surgical Histories:   [name] Cook has a past medical history of Cancer (CMS/HCC), Cervical cancer (CMS/HCC), CVA (cerebral vascular accident) (CMS/HCC) (2015), HLD (hyperlipidemia), HTN (hypertension), Hypertension (12/15/1992), Morbid obesity with BMI of 40.0-44.9, adult (CMS/HCC), Seizure disorder (CMS/HCC), Seizures (CMS/HCC), and TIA (transient ischemic attack). She has a past surgical history that includes hysterectomy abdominal (  2021); hx hysterectomy; esophagogastroduodenoscopy (Bilateral, 05/17/2020); enterscopy (N/A, 05/17/2020); sling pubovaginal (N/A, 06/08/2020); colporrhaphy (N/A, 06/08/2020); perineoplasty (N/A, 06/08/2020); hx appendectomy (01/29/18); removal / revision of sling for stress incontinence (N/A, 06/22/2020); cystoscopy (N/A, 06/22/2020);  cystoscopy biopsy bladder (N/A, 06/22/2020); and cystoscopy (N/A, 08/04/2020).       Allergies / ADRs:   She is allergic to acetaminophen, butalbital-apap-caffeine, cyclobenzaprine, hydrocodone-acetaminophen, ibuprofen, iodinated contrast - iv or oral, iodinated contrast media, ketorolac tromethamine, nsaids, oxycodone-acetaminophen, shellfish-derived products, tramadol, ceftriaxone, ciprofloxacin, dicyclomine, egg-derived products, metoclopramide, and promethazine.       Family History:   Her family history includes Breast Cancer in her sister; Cervical Cancer in her sister; Diabetes mellitus in her father and mother; Hypertension in her father and mother.       Social History:   She reports that she has never smoked. She has never used smokeless tobacco. She reports that she does not currently use alcohol. She reports that she does not use drugs.       Home Medications:   Home Medications    Medication Sig   amLODIPine (NORVASC) 10 MG tablet Take 10 mg by mouth daily.   atorvastatin (LIPITOR) 40 MG tablet Take 40 mg by mouth nightly.   hydrALAZINE (APRESOLINE) 25 MG tablet Take 25 mg by mouth daily.   levETIRAcetam (KEPPRA) 500 MG tablet Take 500 mg by mouth 2 times daily.   lisinopril (ZESTRIL) 10 MG tablet Take 10 mg by mouth daily.        Current Medications:     Current Facility-Administered Medications:   .  amLODIPine (NORVASC) tablet 10 mg, 10 mg, Oral, 1X daily, Haaris Siddiq, MD  .  atorvastatin (LIPITOR) tablet 80 mg, 80 mg, Oral, nightly, Haaris Siddiq, MD  .  clopidogrel (PLAVIX) tablet 75 mg, 75 mg, Oral, 1X daily, Haaris Siddiq, MD  .  diazePAM (VALIUM) tablet 5 mg, 5 mg, Oral, STAT, Anastasia Pall, MD  .  enoxaparin sodium (LOVENOX) injection 40 mg, 40 mg, Subcutaneous, 1X daily, Haaris Siddiq, MD  .  hydrALAZINE (APRESOLINE) tablet 25 mg, 25 mg, Oral, 3X daily, Haaris Siddiq, MD  .  levETIRAcetam (KEPPRA) tablet 500 mg, 500 mg, Oral, 2X daily, Haaris Siddiq, MD  .  lisinopril (ZESTRIL)  tablet 20 mg, 20 mg, Oral, 1X daily, Haaris Siddiq, MD  .  morphine (PF) 4 mg/mL injection 4 mg, 4 mg, Intravenous, Q4H PRN, Wendie Chess, MD, 4 mg at 07/15/22 2251  .  nitroglycerin (NITROSTAT) SL tablet 0.4 mg, 0.4 mg, Sublingual, Q5 min PRN, Anastasia Pall, MD       Physical Exam:  BP (!) 162/98   Pulse 73   Temp 36.6 C (97.9 F) (Oral)   Resp 19   Ht 172.7 cm (5\' 8" )   Wt (!) 145.2 kg (320 lb)   LMP  (LMP Unknown) Comment: HCG negative - 06/08/20 - 0713 AM  SpO2 96%   BMI 48.66 kg/m     Physical Exam  Constitutional:       General: She is not in acute distress.     Appearance: She is obese. She is not ill-appearing.   HENT:      Head: Normocephalic and atraumatic.      Nose: Nose normal.      Mouth/Throat:      Mouth: Mucous membranes are moist.   Eyes:      General: No visual field deficit or scleral icterus.        Right eye:  No discharge.         Left eye: No discharge.      Extraocular Movements: Extraocular movements intact.      Pupils: Pupils are equal, round, and reactive to light.   Cardiovascular:      Rate and Rhythm: Normal rate and regular rhythm.      Heart sounds: No murmur heard.     No friction rub. No gallop.   Pulmonary:      Effort: Pulmonary effort is normal.      Breath sounds: Normal breath sounds. No wheezing, rhonchi or rales.   Abdominal:      General: There is no distension.      Palpations: There is no mass.      Tenderness: There is no abdominal tenderness. There is no guarding or rebound.      Hernia: No hernia is present.   Musculoskeletal:         General: No swelling or tenderness. Normal range of motion.      Cervical back: Normal range of motion and neck supple.      Right lower leg: No edema.      Left lower leg: No edema.   Lymphadenopathy:      Cervical: No cervical adenopathy.   Skin:     General: Skin is warm and dry.      Capillary Refill: Capillary refill takes less than 2 seconds.      Coloration: Skin is not jaundiced or pale.      Findings: No  bruising, erythema, lesion or rash.   Neurological:      Mental Status: She is alert and oriented to person, place, and time.      GCS: GCS eye subscore is 4. GCS verbal subscore is 5. GCS motor subscore is 6.      Cranial Nerves: Facial asymmetry (R. Lower Facial Droop) present. No cranial nerve deficit or dysarthria.      Sensory: No sensory deficit.      Motor: No weakness, tremor, atrophy, abnormal muscle tone, seizure activity or pronator drift.      Coordination: Coordination normal. Finger-Nose-Finger Test normal.      Comments:   RUE 5/5 strength  LUE 5/5 strength  RLE 5/5 strength  LLE 5/5 strength   Psychiatric:         Mood and Affect: Mood normal.         Behavior: Behavior normal.       No intake or output data in the 24 hours ending 07/16/22 0616    Fingersticks:  Hospital Encounter on 07/15/22 (from the past 24 hour(s))   POC Glucose    Collection Time: 07/15/22  4:21 PM   Result Value Ref Range    POC Glucose 82 70 - 100 mg/dL      Laboratory Data:  WBC 11.2*, Hg 15.7*, Plt 158  Na 140, K 3.6, Cl 104, HCO3 23, BUN 14, Cr 0.9, GLUCOSE 80  Ca 11.7*, Mg 2.4, Phos    Lactate  , Troponin  , INR 1.0  ABG:  / /   Troponin: , BNP:  Microbiology Data:  Blood:    Urine:    Sputum:    SARS-CoV-2:         Imaging Review:  CT Head/Brain W/O Con    Addendum Date: 07/15/2022    NOTIFICATION:  Non-critical findings were relayed by Radiology Support Staff to Dr. Dorthy Cooler on 07/15/2022 at 16:34. Thank you for this referral to  [facility] [employer] in partnership with [facility].  For further consultation, please call [phone] or [phone] (phones answered 24/7/365).  Electronically Signed by Demetrio Lapping 07/15/2022 4:55 PM    Result Date: 07/15/2022  INDICATION:  New facial droop, slurred speech 18 hours ago. . TECHNIQUE:  CT of the brain was performed without contrast.  Automated mA/kV exposure control was utilized and patient examination was performed in strict  accordance with principles of ALARA. RADIATION AMOUNT:  317 mGy-cm. COMPARISON:  11/30/2021 CT Head/ brain. FINDINGS: There is no acute intracranial hemorrhage, midline shift, mass effect or extra-axial fluid collection.  Gray-white differentiation is preserved.  Brain volume and ventricular system are within normal limits for age.  The skull base and calvarium are intact.  The visualized paranasal sinuses are unremarkable.  The visualized orbits, globes, and mastoid air cells are unremarkable.      IMPRESSION: No acute intracranial findings or interval change..  Thank you for this referral to [facility] [place] [employer] in partnership with [facility].  For further consultation, please call [phone] or [phone] (phones answered 24/7/365).  Electronically Signed by Demetrio Lapping 07/15/2022 4:32 PM        VTE Prophylaxis (720h ago, onward)       Start     Ordered    07/16/22 1000  enoxaparin sodium (LOVENOX) injection 40 mg  (Patient is Moderate to High Risk for VTE)  1 time daily         07/16/22 0546                  GI Prophylaxis (720h ago, onward)      None             Assessment and Plan:   Tristan Bramble is a 47 y.o. female with PMH of CVA, TIA, seizure disorder on Keppra, cervical cancer, HTN, and HLD who presents to the hospital as a transfer from [facility] due to feeling unwell for the last 4 days and new development of right facial droop that started at 10 AM on 07/15/2022.     # Hypertensive encephalopathy  # Subacute ischemic stroke likely 2/2 hypertension  # Hx of TIA  -LKW 10 AM on 07/15/2022  -Patient has presented to multiple ED's in the last 6 months for similar symptoms  -CT head/brain shows no acute intracranial findings or interval change  -EKG shows NSR  -Patient has persistent right lower facial droop, remainder of neurological exam shows no focal neurological deficits  -Lipids: Total cholesterol 206, LDL 143  -Patient states that her blood  pressure is persistently elevated and at sometimes can even get as high as systolic 230s.  -ESR 33, CRP 6.0    Plan:  -Neurology on board  -Every 4 hours neurochecks  -Fall precautions  -Continue home dose amlodipine 10 mg  -Increased lisinopril to 20 mg daily  -Increased hydralazine to 25 mg 3 times daily  -Started  atorvastatin 80 mg  -Cannot order MRI with contrast or MRA head/neck the patient has an allergy to iodinated contrast (anaphylaxis)  -Ordered 2D echo with bubbles  -Strict n.p.o.  -SLP eval pending  -Ordered PT/OT    # Hx of seizure disorder  Plan:  -Seizure precautions  -Resumed home dose Keppra 500 twice daily    # HLD  Plan:  -Started on atorvastatin 80 mg    Disposition: Pending discharge to home once medically optimized    Code Status:  Orders Placed This Encounter      Code Status CPR, Full Code      Tressia Danas, MD  Electronically signed on 07/16/22              -------------------------------------------------------------------------------  Attestation signed by Miachel Roux., MD at 07/16/22 0935  Attending Attestation:    [x]  I have reviewed:   [x]  Laboratory Data         [x]  ECG                 [x]  Imaging:                           []  Other (specify):                 []  I have independently visualized and interpreted the following studies and my comments are:    ECG:                                                                       Imaging:                                                         Other (specify):                                                            [x]  I have obtained/reviewed old records and my summary is as follows:                               []  I have obtained additional medical history from, specify (name):                           []  I have discussed this case with consultant, specify (name):                              I have seen and examined the patient on 07/16/2022   I reviewed the note written by the Fellow/Resident and agree with the  documentation of the current clinical condition and future plans for care, unless modified  by my comments below.    PROBLEMS <redacted file path>  Principal Problem:    Facial droop (09/21/2021)             Additional Comments:      Start time: 0530  End time: 0600  I spent 30 minutes discussing Advance Care Planning.The patient fully consented to the visit  and discussion.  CC: Advance care planning  This patient is a 47 year old F with a PMH of HTN, cervical CA, obesity who presented to the hospital with CVA  Patient Active Problem List  Diagnosis Code   . History of RAD hysterectomy/BSO Z90.710   . Seizure (CMS/HCC) R56.9   . Right hemiparesis (CMS/HCC) G81.91   . Essential hypertension I10   . Malignant tumor of cervix (CMS/HCC) C53.9   . Morbid (severe) obesity due to excess calories (CMS/HCC) E66.01   . Hemorrhoids K64.9   . Chest pain R07.9   . Hyperlipidemia E78.5   . Chronic pain G89.29   . Chest pain, unspecified type R07.9   . BMI 40.0-44.9, adult (CMS/HCC) Z68.41   . 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 Facial droop R29.810   . Weakness of extremity R29.898   . TIA (transient ischemic attack) G45.9   . Right sided weakness R53.1   . Syncope, unspecified syncope type R55     I met with the patient regarding Advance Care Planning including explanation and importance of  having written advance directives, discussion of the patient's preferences for future care and  importance of discussing their preferences with their family/surrogate.  Resuscitation Code Status was discussed and in the event the patient were to code due to  accident, illness, or other cause, patient states desire for full code.  Patient had an opportunity to ask questions and did so with answers provided to their  comprehension and satisfaction.          QUALITY MEASURES/DIAGNOSES/CONDITIONS:      Electronically signed by: Miachel Roux., MD   07/16/2022 6:40 AM      -------------------------------------------------------------------------------

## 2022-07-18 NOTE — Discharge Summary (Signed)
Discharge Summary  ALLERGIES: Acetaminophen, Butalbital-apap-caffeine, Cyclobenzaprine, Hydrocodone-acetaminophen, Ibuprofen, Iodinated Contrast - Iv Or Oral, Iodinated Contrast Media, Ketorolac Tromethamine, Nsaids, Oxycodone-acetaminophen, Shellfish-derived Products, Tramadol, Ceftriaxone, Ciprofloxacin, Dicyclomine, Egg-derived Products, Metoclopramide, Promethazine CODE STATUS: Full CodeCPR   Name: [name] Cook ([id number])  Age: 47 y.o. DOB: Nov 29, 1975  Room: 8S-105/8S-105-A Date of Admission: 07/15/2022  Discharge Date and Time: 08/07/2022  12:53 PM   Attending Physician: Bland Span, MD     HPI   CC: Facial droop  Reason for admission:   [name] Cook is a 47 y.o. female with PMH of CVA, TIA, seizure disorder on Keppra, cervical cancer, HTN, and HLD who presents to the hospital as a transfer from [facility] due to feeling unwell for the last 4 days and new development of right facial droop that started at 10 AM on 07/15/2022.  The patient states that she frequently checks her blood pressure at home and has been noticing that the numbers have been getting higher and higher.  At some point she checked her blood pressure and the systolic was in the 230s.  She endorses to taking lisinopril 10 mg, amlodipine 10 mg and hydralazine 25 mg daily.  She last saw her PCP 2 months ago and states that her antihypertensive regimen was not revised.  She has had multiple admissions over the last several months, the most recent being on 07/01/2022 to [facility] for similar symptoms.  She denies any fevers, chills, recent sick contacts, recent immobility, recent trauma, lightheadedness, dizziness, nausea, vomiting, palpitations, diaphoresis chest pain, shortness of breath, or abdominal pain.     [facility] ED course:  Vitals T99 F, BP 166/118, HR 81, RR 22, SpO2 98%.  NIHSS in [place] was 2 for facial droop.  Labs significant for WBC 11.2, Hb 15.7, ESR 33, CRP 6.0, Ca 11.7.  Lipid profile significant for total  cholesterol 206, LDL 143.  CT head/brain shows no acute intracranial findings or interval change.  EKG shows NSR with QTc of 473.  She was outside the window for TNK and therefore was not administered.  Neurology was consulted and they recommended reducing BP to less than 140/90, aspirin and Plavix now, atorvastatin 80 mg daily, ordered 2D echo, ordered MRI brain with and without contrast and MRA head and neck.  She will be admitted to medicine for further workup of hypertensive encephalopathy causing subacute ischemic stroke.     Problem List and History   Principal Problem:    Facial droop  Resolved Problems:    * No resolved hospital problems. *    Past Medical History:   Diagnosis Date   . Cancer (CMS/HCC)    . Cervical cancer (CMS/HCC)     s/p TAH by Dr. Hillary Bow   . CVA (cerebral vascular accident) (CMS/HCC) 2015    After HTN emergency; recovered    . HLD (hyperlipidemia)    . HTN (hypertension)    . Hypertension 12/15/1992   . Morbid obesity with BMI of 40.0-44.9, adult (CMS/HCC)    . Seizure disorder (CMS/HCC)    . 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 TIA (transient ischemic attack)      Past Surgical History:   Procedure Laterality Date   . COLPORRHAPHY N/A 06/08/2020    POSTERIOR REPAIR performed by Harvest Forest, MD at [facility] MAIN OR   . CYSTOSCOPY N/A 06/22/2020    CYSTOSCOPY performed by Harvest Forest, MD at [facility] MAIN OR   . CYSTOSCOPY N/A 08/04/2020  SLING REMOVAL,CYSTOSCOPY performed by Harvest Forest, MD at [facility] MAIN OR   . CYSTOSCOPY BIOPSY BLADDER N/A 06/22/2020    CYSTOSCOPY BIOPSY BLADDER performed by Harvest Forest, MD at [facility] MAIN OR   . ENTERSCOPY N/A 05/17/2020    PUSH ENTEROSCOPY performed by Epifania Gore, MD at [facility] ENDO OR   . ESOPHAGOGASTRODUODENOSCOPY Bilateral 05/17/2020    ESOPHAGOGASTRODUODENOSCOPY performed by Epifania Gore, MD at [facility] ENDO OR   . HX APPENDECTOMY  01/29/18   . HX HYSTERECTOMY     . HYSTERECTOMY  ABDOMINAL  2021   . PERINEOPLASTY N/A 06/08/2020    PERINEOPLASTY performed by Harvest Forest, MD at [facility] MAIN OR   . REMOVAL / REVISION OF SLING FOR STRESS INCONTINENCE N/A 06/22/2020     EXAMINATION UNDER ANESTHESIA   performed by Harvest Forest, MD at [facility] MAIN OR   . SLING PUBOVAGINAL N/A 06/08/2020    SLING VAGINAL WITH ALTIS performed by Harvest Forest, MD at [facility] MAIN OR        Medications   Medications Prior to Admission   Medication Sig   . amLODIPine (NORVASC) 10 MG tablet Take 10 mg by mouth daily.   Marland Kitchen atorvastatin (LIPITOR) 40 MG tablet Take 40 mg by mouth nightly.   . hydrALAZINE (APRESOLINE) 25 MG tablet Take 25 mg by mouth daily.   Marland Kitchen levETIRAcetam (KEPPRA) 500 MG tablet Take 500 mg by mouth 2 times daily.   Marland Kitchen lisinopril (ZESTRIL) 10 MG tablet Take 10 mg by mouth daily.          Medication List        START taking these medications        Notes   clopidogrel 75 mg tablet  Take 1 tablet by mouth daily for 30 days.  Commonly known as: PLAVIX  Start taking on: Jul 19, 2022           pantoprazole 40 MG Pack packet  Take 1 packet by mouth daily for 30 days.  Commonly known as: PROTONIX                  CHANGE how you take these medications        Notes   atorvastatin 80 MG tablet  Take 1 tablet by mouth nightly for 30 days.  Commonly known as: LIPITOR  What changed:   medication strength  how much to take           hydrALAZINE 25 MG tablet  Take 1 tablet by mouth 3 times daily for 30 days.  Commonly known as: APRESOLINE  What changed: when to take this           lisinopril 20 MG tablet  Take 1 tablet by mouth daily for 30 days.  Commonly known as: ZESTRIL  Start taking on: Jul 19, 2022  What changed:   medication strength  how much to take                  CONTINUE taking these medications        Notes   amLODIPine 10 MG tablet  Take 1 tablet by mouth daily for 30 days.  Commonly known as: NORVASC  Start taking on: Jul 19, 2022           levETIRAcetam 500 MG tablet  Take  500 mg by mouth 2 times daily.  Commonly known as: KEPPRA  Your prescriptions may be filled at any pharmacy        These medications were sent to [id number] - [place] - [address]  [address], [place] [address]      Phone: [phone]   amLODIPine 10 MG tablet  atorvastatin 80 MG tablet  clopidogrel 75 mg tablet  hydrALAZINE 25 MG tablet  lisinopril 20 MG tablet  pantoprazole 40 MG Pack packet          Hospital Course   Patient presented during this admission with left facial droop and right sided weakness. She had a negative head ct.  MRI couldn't be done secondary to patient being allergic to the dye.   She was evaluated by neurology whom recommended outpatient follow up with them within 2 weeks .   Patient was restarted on her statin and plavix.  She was also placed on keppra for seizure prophylaxis .   She was educated about TIA/stroke.   At this time etiology of symptoms unclear but could be secondary to TIA. She will need to follow up with neurology for further evaluation.  She was discharged home in stable condition. Symptoms had improved at the time  of discharge     Data   BMP: 07/18/2022: Sodium 141; Potassium 3.5; Chloride 107; CO2 Total 24; BUN 13; Creatinine 0.8; Glucose Bld 122; Calcium 9.0  CBC: 07/17/2022: WBC 9.0; Hemoglobin 13.9; Hematocrit 41.3; Platelets 143     Discharge Orders   Discharge Diet Options (Required):: Document discharge diet  Discharge Diet: Low Sodium (salt)        Discharge Activity: As tolerated                   Discharge Status   Condition: Good  Disposition: Home  Lay Caregiver identified by patient:  ,       [place] Diagnoses / Follow Up          The patient has these upcoming follow-up appointments:  Unknown Foley, CRNP  [address] [place] [address]  [phone]             Total discharge planning, communication/discussion and coordination of care today was greater than 55 minutes.

## 2022-07-18 NOTE — Nursing Note (Signed)
07/18/22 12:02 PM to 12:12 PM,10 minutes; [name], Debarah Crape, RN

## 2022-07-19 ENCOUNTER — Emergency Department: Payer: No Typology Code available for payment source

## 2022-07-19 ENCOUNTER — Other Ambulatory Visit: Payer: Self-pay

## 2022-07-19 ENCOUNTER — Observation Stay
Admission: EM | Admit: 2022-07-19 | Discharge: 2022-07-21 | Disposition: A | Discharge status: Home / Self Care | Payer: No Typology Code available for payment source | Attending: Internal Medicine | Admitting: Internal Medicine

## 2022-07-19 DIAGNOSIS — C539 Malignant neoplasm of cervix uteri, unspecified: Secondary | ICD-10-CM | POA: Insufficient documentation

## 2022-07-19 DIAGNOSIS — E876 Hypokalemia: Secondary | ICD-10-CM | POA: Insufficient documentation

## 2022-07-19 DIAGNOSIS — R29810 Facial weakness: Secondary | ICD-10-CM | POA: Diagnosis present

## 2022-07-19 DIAGNOSIS — R079 Chest pain, unspecified: Secondary | ICD-10-CM | POA: Insufficient documentation

## 2022-07-19 DIAGNOSIS — I712 Thoracic aortic aneurysm, without rupture, unspecified: Secondary | ICD-10-CM

## 2022-07-19 DIAGNOSIS — E785 Hyperlipidemia, unspecified: Secondary | ICD-10-CM | POA: Insufficient documentation

## 2022-07-19 DIAGNOSIS — K219 Gastro-esophageal reflux disease without esophagitis: Secondary | ICD-10-CM | POA: Insufficient documentation

## 2022-07-19 DIAGNOSIS — G40909 Epilepsy, unspecified, not intractable, without status epilepticus: Secondary | ICD-10-CM | POA: Insufficient documentation

## 2022-07-19 DIAGNOSIS — I2510 Atherosclerotic heart disease of native coronary artery without angina pectoris: Secondary | ICD-10-CM | POA: Insufficient documentation

## 2022-07-19 DIAGNOSIS — Z8669 Personal history of other diseases of the nervous system and sense organs: Secondary | ICD-10-CM

## 2022-07-19 DIAGNOSIS — G510 Bell's palsy: Principal | ICD-10-CM | POA: Insufficient documentation

## 2022-07-19 DIAGNOSIS — Z91041 Radiographic dye allergy status: Secondary | ICD-10-CM | POA: Insufficient documentation

## 2022-07-19 DIAGNOSIS — Z8673 Personal history of transient ischemic attack (TIA), and cerebral infarction without residual deficits: Secondary | ICD-10-CM | POA: Insufficient documentation

## 2022-07-19 DIAGNOSIS — I7121 Aneurysm of the ascending aorta, without rupture: Secondary | ICD-10-CM | POA: Insufficient documentation

## 2022-07-19 DIAGNOSIS — Z6841 Body Mass Index (BMI) 40.0 and over, adult: Secondary | ICD-10-CM | POA: Insufficient documentation

## 2022-07-19 DIAGNOSIS — R9082 White matter disease, unspecified: Secondary | ICD-10-CM | POA: Insufficient documentation

## 2022-07-19 DIAGNOSIS — I10 Essential (primary) hypertension: Secondary | ICD-10-CM | POA: Insufficient documentation

## 2022-07-19 DIAGNOSIS — G8191 Hemiplegia, unspecified affecting right dominant side: Secondary | ICD-10-CM | POA: Diagnosis present

## 2022-07-19 DIAGNOSIS — D649 Anemia, unspecified: Secondary | ICD-10-CM | POA: Insufficient documentation

## 2022-07-19 HISTORY — DX: Palpitations: R00.2

## 2022-07-19 HISTORY — DX: Migraine, unspecified, not intractable, without status migrainosus: G43.909

## 2022-07-19 HISTORY — DX: Morbid (severe) obesity due to excess calories: E66.01

## 2022-07-19 HISTORY — DX: Hyperlipidemia, unspecified: E78.5

## 2022-07-19 HISTORY — DX: Thoracic aortic aneurysm, without rupture, unspecified: I71.20

## 2022-07-19 LAB — CBC AND DIFFERENTIAL
Absolute NRBC: 0 10*3/uL (ref 0.00–0.00)
Basophils Absolute Automated: 0.06 10*3/uL (ref 0.00–0.08)
Basophils Automated: 0.6 %
Eosinophils Absolute Automated: 0.16 10*3/uL (ref 0.00–0.44)
Eosinophils Automated: 1.7 %
Hematocrit: 44.9 % — ABNORMAL HIGH (ref 34.7–43.7)
Hgb: 14.4 g/dL (ref 11.4–14.8)
Immature Granulocytes Absolute: 0.02 10*3/uL (ref 0.00–0.07)
Immature Granulocytes: 0.2 %
Instrument Absolute Neutrophil Count: 5.96 10*3/uL (ref 1.10–6.33)
Lymphocytes Absolute Automated: 2.45 10*3/uL (ref 0.42–3.22)
Lymphocytes Automated: 26.5 %
MCH: 30.6 pg (ref 25.1–33.5)
MCHC: 32.1 g/dL (ref 31.5–35.8)
MCV: 95.5 fL (ref 78.0–96.0)
MPV: 13.8 fL — ABNORMAL HIGH (ref 8.9–12.5)
Monocytes Absolute Automated: 0.59 10*3/uL (ref 0.21–0.85)
Monocytes: 6.4 %
Neutrophils Absolute: 5.96 10*3/uL (ref 1.10–6.33)
Neutrophils: 64.6 %
Nucleated RBC: 0 /100 WBC (ref 0.0–0.0)
Platelets: 158 10*3/uL (ref 142–346)
RBC: 4.7 10*6/uL (ref 3.90–5.10)
RDW: 13 % (ref 11–15)
WBC: 9.24 10*3/uL (ref 3.10–9.50)

## 2022-07-19 LAB — ECG 12-LEAD
Atrial Rate: 82 {beats}/min
Atrial Rate: 86 {beats}/min
IHS MUSE NARRATIVE AND IMPRESSION: NORMAL
IHS MUSE NARRATIVE AND IMPRESSION: NORMAL
P Axis: 29 degrees
P Axis: 33 degrees
P-R Interval: 146 ms
P-R Interval: 164 ms
Q-T Interval: 360 ms
Q-T Interval: 402 ms
QRS Duration: 86 ms
QRS Duration: 90 ms
QTC Calculation (Bezet): 430 ms
QTC Calculation (Bezet): 469 ms
R Axis: -1 degrees
R Axis: -3 degrees
T Axis: 158 degrees
T Axis: 199 degrees
Ventricular Rate: 82 {beats}/min
Ventricular Rate: 86 {beats}/min

## 2022-07-19 LAB — COMPREHENSIVE METABOLIC PANEL
ALT: 23 U/L (ref 0–55)
AST (SGOT): 23 U/L (ref 5–41)
Albumin/Globulin Ratio: 1 (ref 0.9–2.2)
Albumin: 4.1 g/dL (ref 3.5–5.0)
Alkaline Phosphatase: 115 U/L (ref 37–117)
Anion Gap: 10 (ref 5.0–15.0)
BUN: 13 mg/dL (ref 7.0–21.0)
Bilirubin, Total: 0.8 mg/dL (ref 0.2–1.2)
CO2: 26 mEq/L (ref 17–29)
Calcium: 9.6 mg/dL (ref 8.5–10.5)
Chloride: 107 mEq/L (ref 99–111)
Creatinine: 0.9 mg/dL (ref 0.4–1.0)
Globulin: 4.2 g/dL — ABNORMAL HIGH (ref 2.0–3.6)
Glucose: 99 mg/dL (ref 70–100)
Potassium: 3.3 mEq/L — ABNORMAL LOW (ref 3.5–5.3)
Protein, Total: 8.3 g/dL (ref 6.0–8.3)
Sodium: 143 mEq/L (ref 135–145)
eGFR: >60 mL/min/{1.73_m2} (ref >=60)

## 2022-07-19 LAB — HIGH SENSITIVITY TROPONIN-I: hs Troponin-I: <2.7 ng/L

## 2022-07-19 NOTE — EDIE (Signed)
[facility]?NOTIFICATION?07/19/2022 17:58?[name]?MRN: [id number]    Criteria Met      5 ED Visits in 12 Months    Security and Safety  No Security Events were found.  ED Care Guidelines  There are currently no ED Care Guidelines for this patient. Please check your facility's medical records system.        Prescription Monitoring Program  Narx Score not available at this time.    E.D. Visit Count (12 mo.)  Facility Visits   [name] 3   [facility] 2   Total 5   Note: Visits indicate total known visits.     Recent Emergency Department Visit Summary  Date Facility [facility] Type Diagnoses or Chief Complaint    Jul 19, 2022  [place] [facility] [place] [facility]  [place]  [place]  Emergency      triage- CP, facial droop x 4 days      triage      Jun 30, 2022  [place] [facility]  [place]  [place]  Emergency      Chest pain, unspecified      Chest Pain      Shortness of Breath      triage - Chest Pain      triage      Jun 12, 2022  [facility] Rhett Bannister  MD  Emergency      Syncope and collapse      Chest pain, unspecified      Hypokalemia      Pain, Chest      Light Headed, DIzzy, back pain, Chest pain      Apr 04, 2022  [facility] Rhett Bannister  MD  Emergency      Other chest pain      Essential (primary) hypertension      Chest pain, unspecified      Pain, Rib      Chest Pain,SOB,Left Side Body Pain      Oct 19, 2021  [name] Rhett Bannister  MD  Emergency      Headache, unspecified      Facial myokymia      Hypertension      Headache      high BP face twitching        Recent Inpatient Visit Summary  Date Facility [facility] Type Diagnoses or Chief Complaint    Nov 30, 2021  [facility] Rhett Bannister  MD  Internal Medicine      Weakness      Jul 29, 2021  [place] [facility] [place] [facility]  [name]  [place]  Medical Surgical      Facial weakness        Care Team  No Care Team was found.  [facility]  This patient has registered at the [facility] Emergency Department  For more  information visit: [email or web address]     PLEASE NOTE:     1.   Any care recommendations and other clinical information are provided as guidelines or for historical purposes only, and providers should exercise their own clinical judgment when providing care.    2.   You may only use this information for purposes of treatment, payment or health care operations activities, and subject to the limitations of applicable [facility] Policies.    3.   You should consult directly with the organization that provided a care guideline or other clinical history with any questions  about additional information or accuracy or completeness of information provided.    ? [id number] [facility] - [email or web address]

## 2022-07-19 NOTE — Plan of Care (Signed)
Stroke Team Telephone Note    Call received from emergency department MD/APP, case discussed briefly. Patient is presenting with chest pain and R facial droop x4 days. Head CT at OSH negative but patient refused MRI. Patient presents today with worsening CP and facial droop. Repeat head CT negative for acute intracranial hemorrhage. Per MD, patient with inconsistent exam findings. Will admit for MRI Brain.       Patient physically assessed?: YES/NO: no  Imaging personally reviewed? If Yes, relevant findings: Yes, no relevant findings.      Based on history and exam provided, patient does not need to be a stroke alert at this time..  Recommended MRI Brain WO contrast and MRA Head and Neck. Will defer formal consult pending imaging, please call [phone] for imaging questions or updates. If demonstrates acute stroke, formal consult to follow. .      Thank you for allowing [place] to participate in the care of [name] Cook.  Case was also discussed with Dr. Delbert Phenix, who agrees with plan above. Please contact us with any questions at any time.    Roger Kill, [place], 07/19/2022 9:55 PM  Neurology/Stroke Team APP  Hospital Ext/Spectra #: [phone]     Electronically signed by: Roger Kill, PA 07/19/2022 9:55 PM

## 2022-07-19 NOTE — ED Provider Notes (Signed)
History     Chief Complaint   Patient presents with    Chest Pain    Facial Droop     47 year old female with past medical history of seizure, CVA, hypertension, hyperlipidemia presents with right-sided facial droop and right leg weakness.  She states this is known 5 days ago.  Patient is not a TNK candidate or stroke alert does not ongoing for 5 days.  She was initially seen at [facility] ED.  She was transferred to another [place] [facility] [place] ED bed left.  She states has had respiratory arrest with both gadolinium and IV contrast.  She is constantly asking for pain medication.  She is also complaining of chest pain.         Past Medical History:   Diagnosis Date    Hypercholesteremia     Hypertension     Seizures     Stroke     2015 and 2017, right side    TIA (transient ischemic attack) 2017       Past Surgical History:   Procedure Laterality Date    APPENDECTOMY (OPEN)      CHOLECYSTECTOMY      EGD, BIOPSY N/A 04/16/2017    Procedure: EGD, BIOPSY;  Surgeon: Pershing Proud, MD;  Location: [facility];  Service: Gastroenterology;  Laterality: N/A;    HYSTERECTOMY         Family History   Problem Relation Age of Onset    Myocardial Infarction Father 42    Deep vein thrombosis Father     No known problems Mother        Social  Social History     Tobacco Use    Smoking status: Never    Smokeless tobacco: Never   Vaping Use    Vaping status: Never Used   Substance Use Topics    Alcohol use: No    Drug use: No       .     Allergies   Allergen Reactions    Contrast [Iodinated Contrast Media] Anaphylaxis     Patient woke up in ICU after having CT with contrast, last remembers being in CT    Fentanyl Anaphylaxis     Throat swelling    Fioricet [Butalbital-Apap-Caffeine] Hives    Flexeril [Cyclobenzaprine] Edema     Causes throat swelling per Pt report    Motrin [Ibuprofen] Anaphylaxis     Throat swelling    Tramadol Anaphylaxis     Throat swelling    Bentyl [Dicyclomine] Edema    Percocet  [Oxycodone-Acetaminophen] Hives    Shellfish-Derived Products Hives    Toradol [Ketorolac Tromethamine] Hives    Tylenol [Acetaminophen] Hives       Home Medications       Med List Status: In Progress Set By: Carver Fila, RN at 07/19/2022  7:24 PM              alum & mag hydroxide-simethicone (MAALOX PLUS) 200-200-20 mg/5 mL suspension     Take 30 mLs by mouth every 4 (four) hours as needed for Indigestion     atorvastatin (LIPITOR) 40 MG tablet     Take 1 tablet (40 mg total) by mouth daily     hydrALAZINE (APRESOLINE) 25 MG tablet     Take 1 tablet (25 mg) by mouth daily     hydroCHLOROthiazide (HYDRODIURIL) 25 MG tablet     Take 1 tablet (25 mg) by mouth daily     LevETIRAcetam (KEPPRA PO)  Take 500 mg by mouth 2 (two) times daily.        pantoprazole (PROTONIX) 40 MG tablet     Take 1 tablet (40 mg total) by mouth daily             Review of Systems   Constitutional:  Negative for chills and fever.   HENT:  Negative for congestion and ear pain.    Eyes:  Negative for pain and discharge.   Respiratory:  Negative for cough, chest tightness, shortness of breath and stridor.    Cardiovascular:  Positive for chest pain. Negative for palpitations and leg swelling.   Gastrointestinal:  Negative for abdominal pain, diarrhea, nausea and vomiting.   Genitourinary:  Negative for flank pain and frequency.   Musculoskeletal:  Negative for back pain and neck pain.   Skin:  Negative for color change and pallor.   Neurological:  Positive for facial asymmetry. Negative for dizziness and syncope.   Psychiatric/Behavioral:  Negative for agitation and behavioral problems.        Physical Exam    BP: (!) 181/95, Heart Rate: 92, Temp: 97.9 F (36.6 C), Resp Rate: 18, SpO2: 97 %, Weight: 140.6 kg    Physical Exam  Vitals and nursing note reviewed.   Constitutional:       Appearance: She is well-developed.   Cardiovascular:      Rate and Rhythm: Normal rate and regular rhythm.   Pulmonary:      Effort: Pulmonary effort is  normal.      Breath sounds: Normal breath sounds.   Abdominal:      General: Bowel sounds are normal.      Palpations: Abdomen is soft.   Musculoskeletal:         General: Normal range of motion.   Skin:     General: Skin is warm and dry.   Neurological:      General: No focal deficit present.      Mental Status: She is alert and oriented to person, place, and time.      Comments: Right facial weakness and not involving forehead   Psychiatric:         Mood and Affect: Mood is anxious.           MDM and ED Course     ED Medication Orders (From admission, onward)      None               Medical Decision Making  Amount and/or Complexity of Data Reviewed  Radiology: ordered.      Patient with 5 days of right facial weakness and questionable right leg weakness.  Her leg is normal on my exam.  She is also complaining of chest pain.  She has known history of aneurysm.  Will do a dry CT and MRA chest.       Heart Score Calculator    0 points 1 point 2 points    History Slightly suspicious Moderately suspicious Highly suspicious 0   EKG Normal Non-specific repolarization disturbance Significant ST depression 0   Age (years) <45 45-64 ?83 1   Risk factors* No known risk factors 1-2 risk factors ?3 risk factors or history of atherosclerotic disease 2   Initial troponin ?normal limit 1-3 normal limit >3 normal limit 0      Total Score   3     *Risk factors: HTN, hypercholesterolemia, DM, obesity (BMI >30 kg/m), smoking (current, or smoking cessation ?3 month), positive family  history (parent or sibling with CVD before age 33); atherosclerotic disease: prior MI, PCI/CABG, CVA/TIA, or peripheral arterial disease.  General Recommendations   (Some patients may require a more conservative plan of care)  Score 0 - 3:  Discharge and follow up with PCP. (Major Adverse Cardiac Event risk 2.5% over next six weeks)  Score 4 - 6: Observe for 6 hours and consider inpatient or early outpatient stress testing in the Chest Pain Clinic.  (Major Adverse Cardiac Event risk 20.3% over next six weeks)  Score 7 - 10: Observe and consider in-hospital stress testing or Cardiology consultation. (Major Adverse Cardiac Event risk 72.7% over next six weeks)  A Major Adverse Cardiac Event was defined as all-cause mortality, myocardial infarction, or coronary revascularization.         Procedures    Clinical Impression & Disposition     Clinical Impression  Final diagnoses:   Facial droop        ED Disposition       ED Disposition   Observation    Condition   --    Date/Time   Wed Jul 19, 2022 10:02 PM    Comment   Admitting Physician: Leeroy Bock [[id number]]   Service:: Neurology [[id number]]   Stroke or Rule Out Stroke?: Yes   Estimated Length of Stay: < 2 midnights   Tentative Discharge Plan?: Home or Self Care [1]   Does patient need telemetry?: Yes   Is patient 18 yrs or greater?: Yes   Telemetry type (separate Telemetry order is also required):: Adult telemetry   Was admission to another facility considered?: Yes   Detail:: Patient declined                  New Prescriptions    No medications on file      Interpretations  O2 Sat:  The patient's oxygen saturation was 96 % on room air. This was independently interpreted by me as Normal.     Monitor interpreted by me independently  Narrow regular 80's        EKG interpreted by me independently  NSR @  80's LVH nl interval nl axis nl stt  Compared with prior   no change      10:02 PM  I discussed with Dr.  Alveta Heimlich Hospitalist on call and accepts patient to  tele       Elesa Hacker, MD  07/20/22 [phone]

## 2022-07-19 NOTE — H&P (Signed)
[facility] HOSPITALIST ADMISSION HISTORY AND PHYSICAL EXAM    Date Time: 07/20/22 1:20 AM  Patient Name: [name] Cook  Primary Care Physician: Unknown Foley, NP    CC: right facial droop, chest pain      History of Presenting Illness:   [name] Cook is a 47 y.o. female with h/o HTN, HLD, thoracic aortic aneurysm,  morbid obesity, migraine, palpitations, stroke in 09/2013 per patient (MRI report says no evidence of an acute infarct, it showed a single focal 3 mm area of signal alteration in the cerebral white matter tracts in the left frontal region), seizure disorder (on keppra), and allergy to multiple medications who presented with right facial droop and chest pain for the last 4 days. Symptoms have been persistent. She describes the chest pain as being mid-sternal region 2 days ago, dull, persistent, radiates to bilateral neck and flanks and back. She also states she had double vision, nausea/vomiting, and passed out prior to coming to ED. She denies any headache.     Patient was admitted at [facility] on 07/16/22 for the above complaints. Her BP was markedly elevated with systolic around 230. CT head and ECHO with bubble study was unremarkable. Neurologist consult was requested,  consult note states "I knew this pt well when she was admitted to [facility] in July and September 2023 for right facial drooping and right side weakness. The brain MRI in July 2023 was negative for stroke. Neurol exam showed her facial drooping was not consistent. Her facial weakness would spontaneously improved when she was distracted during the repeated interviews. The record at [facility] showed that she had frequent ED visit due to similar symptoms or chest pains."    Admission to [facility] in 06/2022 for chest pain and hypertensive urgency. CT chest and then MRA chest done to rule out aortic dissection negative. Prior multiple MRI and MRA chest in Dec/2023 negative. MRI brain and MRA head&neck in Dec/2023 negative for stroke.      Past Medical History:     Past Medical History:   Diagnosis Date    Hyperlipidemia     Hypertension     Migraine     Morbid obesity     Palpitations     loop recorder placed in 2019, had to be removed after a month due to misplacement and keloid formation per patient    Seizures     Stroke     2015    Thoracic aortic aneurysm     TIA (transient ischemic attack) 2017       Past Surgical History:     Past Surgical History:   Procedure Laterality Date    APPENDECTOMY (OPEN)      CHOLECYSTECTOMY      EGD, BIOPSY N/A 04/16/2017    Procedure: EGD, BIOPSY;  Surgeon: Pershing Proud, MD;  Location: [facility];  Service: Gastroenterology;  Laterality: N/A;    HYSTERECTOMY         Family History:     Family History   Problem Relation Age of Onset    Myocardial Infarction Father 27    Deep vein thrombosis Father     No known problems Mother        Social History:     Social History     Socioeconomic History    Marital status: Married     Spouse name: Not on file    Number of children: Not on file    Years of education: Not on file  Highest education level: Not on file   Occupational History    Not on file   Tobacco Use    Smoking status: Never    Smokeless tobacco: Never   Vaping Use    Vaping status: Never Used   Substance and Sexual Activity    Alcohol use: No    Drug use: No    Sexual activity: Not on file   Other Topics Concern    Not on file   Social History Narrative    Not on file     Social Determinants of Health     Financial Resource Strain: Patient Declined (06/30/2022)    Overall Financial Resource Strain (CARDIA)     Difficulty of Paying Living Expenses: Patient declined   Food Insecurity: No Food Insecurity (07/19/2022)    Hunger Vital Sign     Worried About Running Out of Food in the Last Year: Never true     Ran Out of Food in the Last Year: Never true   Transportation Needs: No Transportation Needs (07/19/2022)    PRAPARE - Therapist, art (Medical): No     Lack of Transportation  (Non-Medical): No   Physical Activity: Insufficiently Active (06/30/2022)    Exercise Vital Sign     Days of Exercise per Week: 3 days     Minutes of Exercise per Session: 30 min   Stress: No Stress Concern Present (06/30/2022)    [employer] of Occupational Health - Occupational Stress Questionnaire     Feeling of Stress : Not at all   Social Connections: Socially Integrated (06/30/2022)    Social Connection and Isolation Panel [NHANES]     Frequency of Communication with Friends and Family: Three times a week     Frequency of Social Gatherings with Friends and Family: Once a week     Attends Religious Services: More than 4 times per year     Active Member of [employer] or Organizations: Yes     Attends Engineer, structural: More than 4 times per year     Marital Status: Married   Catering manager Violence: Not At Risk (07/19/2022)    Humiliation, Afraid, Rape, and Kick questionnaire     Fear of Current or Ex-Partner: No     Emotionally Abused: No     Physically Abused: No     Sexually Abused: No   Housing Stability: Low Risk  (07/19/2022)    Housing Stability Vital Sign     Unable to Pay for Housing in the Last Year: No     Number of Places Lived in the Last Year: 1     Unstable Housing in the Last Year: No       Allergies:     Allergies   Allergen Reactions    Contrast [Iodinated Contrast Media] Anaphylaxis     Patient woke up in ICU after having CT with contrast, last remembers being in CT    Fentanyl Anaphylaxis     Throat swelling    Fioricet [Butalbital-Apap-Caffeine] Hives    Flexeril [Cyclobenzaprine] Edema     Causes throat swelling per Pt report    Motrin [Ibuprofen] Anaphylaxis     Throat swelling    Tramadol Anaphylaxis     Throat swelling    Bentyl [Dicyclomine] Edema    Percocet [Oxycodone-Acetaminophen] Hives    Shellfish-Derived Products Hives    Toradol [Ketorolac Tromethamine] Hives    Tylenol [Acetaminophen] Hives  Medications:     Home Medications       Med List Status: Complete Set By:  Carlean Purl, RN at 07/19/2022 11:22 PM              alum & mag hydroxide-simethicone (MAALOX PLUS) 200-200-20 mg/5 mL suspension     Take 30 mLs by mouth every 4 (four) hours as needed for Indigestion     Patient not taking: Reported on 07/19/2022     atorvastatin (LIPITOR) 40 MG tablet     Take 1 tablet (40 mg total) by mouth daily     hydrALAZINE (APRESOLINE) 25 MG tablet     Take 1 tablet (25 mg) by mouth daily     hydroCHLOROthiazide (HYDRODIURIL) 25 MG tablet     Take 1 tablet (25 mg) by mouth daily     LevETIRAcetam (KEPPRA PO)     Take 500 mg by mouth 2 (two) times daily.        lisinopril (ZESTRIL) 10 MG tablet     Take 1 tablet (10 mg) by mouth daily             Review of Systems:   All other systems were reviewed and are negative except: as above    Physical Exam:     Vitals:    07/19/22 2214 07/19/22 2257 07/19/22 2300 07/19/22 2304   BP: 149/79   (!) 157/91   Pulse: 64   64   Resp:    18   Temp:    98.4 F (36.9 C)   TempSrc:  Oral  Oral   SpO2: 99%   95%   Weight:   140.6 kg (310 lb)    Height:   1.778 m (5\' 10" )         General: Awake, alert, oriented x 3. No acute distress.   HEENT: PERRLA, EOMI,  Anicteric sclera. Moist mucous membranes  Neck: Supple. No lymphadenopathy. No JVD. No carotid bruits  Cardiovascular: Regular rate and rhythm. No murmurs, rubs or gallops  Lungs: Clear to auscultation bilaterally. No wheezing, rhonchi, or rales  Abdomen: Soft,. Non-tender. Non-distended. No palpable mass. Normoactive bowel sounds   Extremities: No clubbing. No cyanosis. No edema  Neuro: Clear speech. Has facial mask on. When mask removed, mouth deviation to the right is noted that appears to be intentionally induced. Less strength/effort on right extremities noted.   Skin: No rashes or lesions noted  Psych: Normal affect.       Labs:     Results       Procedure Component Value Units Date/Time    CBC and differential [[id number]]  (Abnormal) Collected: 07/19/22 1854    Specimen: Blood Updated: 07/19/22 2010      WBC 9.24 x10 3/uL      Hgb 14.4 g/dL      Hematocrit 40.9 %      Platelets 158 x10 3/uL      RBC 4.70 x10 6/uL      MCV 95.5 fL      MCH 30.6 pg      MCHC 32.1 g/dL      RDW 13 %      MPV 13.8 fL      Instrument Absolute Neutrophil Count 5.96 x10 3/uL      Neutrophils 64.6 %      Lymphocytes Automated 26.5 %      Monocytes 6.4 %      Eosinophils Automated 1.7 %      Basophils Automated  0.6 %      Immature Granulocytes 0.2 %      Nucleated RBC 0.0 /100 WBC      Neutrophils Absolute 5.96 x10 3/uL      Lymphocytes Absolute Automated 2.45 x10 3/uL      Monocytes Absolute Automated 0.59 x10 3/uL      Eosinophils Absolute Automated 0.16 x10 3/uL      Basophils Absolute Automated 0.06 x10 3/uL      Immature Granulocytes Absolute 0.02 x10 3/uL      Absolute NRBC 0.00 x10 3/uL     High Sensitivity Troponin-I [[id number]] Collected: 07/19/22 1854    Specimen: Blood Updated: 07/19/22 1943     hs Troponin-I <2.7 ng/L     Comprehensive metabolic panel [[id number]]  (Abnormal) Collected: 07/19/22 1854    Specimen: Blood Updated: 07/19/22 1938     Glucose 99 mg/dL      BUN 78.2 mg/dL      Creatinine 0.9 mg/dL      Sodium 956 mEq/L      Potassium 3.3 mEq/L      Chloride 107 mEq/L      CO2 26 mEq/L      Calcium 9.6 mg/dL      Protein, Total 8.3 g/dL      Albumin 4.1 g/dL      AST (SGOT) 23 U/L      ALT 23 U/L      Alkaline Phosphatase 115 U/L      Bilirubin, Total 0.8 mg/dL      Globulin 4.2 g/dL      Albumin/Globulin Ratio 1.0     Anion Gap 10.0     eGFR >60.0 mL/min/1.73 m2             Radiology Results (24 Hour)       Procedure Component Value Units Date/Time    CT Head WO Contrast [[id number]] Collected: 07/19/22 2109    Order Status: Completed Updated: 07/19/22 2114    Narrative:      HISTORY: Facial droop r side weakness.     COMPARISON: MRI brain 07/29/2021    TECHNIQUE: CT of the head performed without intravenous contrast. The  following dose reduction techniques were utilized: automated exposure  control and/or adjustment  of the mA and/or KV according to patient size,  and the use of an iterative reconstruction technique.    CONTRAST: None.    FINDINGS:  No midline shift, mass effect, parenchymal hemorrhage, or evidence of acute  territorial infarction.    No evidence of hydrocephalus. No extra-axial fluid collections.    No significant paranasal sinus disease. The mastoids are clear. The  calvarium is intact.      Impression:         No intracranial hemorrhage or acute intracranial findings.    Brenton Grills, MD  07/19/2022 9:12 PM    CT Chest Abdomen Pelvis WO Contrast [[id number]] Collected: 07/19/22 2049    Order Status: Completed Updated: 07/19/22 2112    Narrative:      HISTORY:  Chest and back pain, history of dilated mid ascending thoracic  aorta    COMPARISON: 06/30/2022    TECHNIQUE: CT of the chest, abdomen, and pelvis performed without  intravenous contrast. The following dose reduction techniques were  utilized: automated exposure control and/or adjustment of the mA and/or KV  according to patient size, and the use of an iterative reconstruction  technique.    CONTRAST: None.    FINDINGS:  Please note that evaluation  of the viscera and vasculature is limited in  the absence of IV contrast.    Chest:  There is dilatation of the ascending thoracic aorta with a transluminal  diameter of 4.3 cm. There is no adenopathy. There is enlargement of the  left thyroid lobe. There are no infiltrates or effusions. There are regions  of mild scarring within the medial aspect of the right lower lobe, lingula,  medial aspect of the right lower lobe and basilar aspect of the left lower  lobe. No suspicious pulmonary nodules are identified. There is a small  hiatal hernia.    Abdomen/Pelvis:  The gallbladder and uterus are surgically absent. The liver, spleen, right  kidney, adrenal glands, and pancreas are normal. There is a cyst within the  upper pole of the left kidney measuring 1.3 cm. There is no bowel  dilatation. There is thickening  involving the ascending colon extending to  the hepatic flexure. The small bowel is unremarkable. There is no free  fluid or adenopathy. There is a midline anterior abdominal wall scar.      Impression:        1. No evidence for acute intrathoracic pathology.  2. Dilatation of the ascending thoracic aorta measuring 4.3 cm.  3. Thickening involving the ascending colon extending to the hepatic  flexure compatible with colitis.    Collene Schlichter, MD  07/19/2022 9:09 PM    XR Chest 2 Views [[id number]] Collected: 07/19/22 1900    Order Status: Completed Updated: 07/19/22 1903    Narrative:      HISTORY: Chest pain     COMPARISON: 06/30/2022    FINDINGS:   Cardiomediastinal silhouette is normal. No pulmonary edema. No focal  consolidation, pleural effusion or pneumothorax.      Impression:        No acute intrathoracic process.    Shelly Flatten, MD  07/19/2022 7:01 PM            EKG Results       Procedure Component Value Units Date/Time    ECG 12 lead [[id number]] Collected: 07/19/22 1819     Updated: 07/19/22 1820     Ventricular Rate 86 BPM      Atrial Rate 86 BPM      P-R Interval 164 ms      QRS Duration 86 ms      Q-T Interval 360 ms      QTC Calculation (Bezet) 430 ms      P Axis 29 degrees      R Axis -3 degrees      T Axis 158 degrees      IHS MUSE NARRATIVE AND IMPRESSION --     NORMAL SINUS RHYTHM  LEFT VENTRICULAR HYPERTROPHY  NONSPECIFIC T WAVE ABNORMALITY  ABNORMAL ECG  WHEN COMPARED WITH ECG OF 19-Jul-2022 18:18, (UNCONFIRMED)  NO SIGNIFICANT CHANGE WAS FOUND      Narrative:      NORMAL SINUS RHYTHM  LEFT VENTRICULAR HYPERTROPHY  NONSPECIFIC T WAVE ABNORMALITY  ABNORMAL ECG  WHEN COMPARED WITH ECG OF 19-Jul-2022 18:18, (UNCONFIRMED)  NO SIGNIFICANT CHANGE WAS FOUND    ECG 12 lead [[id number]] Collected: 07/19/22 1818     Updated: 07/19/22 1819     Ventricular Rate 82 BPM      Atrial Rate 82 BPM      P-R Interval 146 ms      QRS Duration 90 ms      Q-T Interval 402 ms      QTC  Calculation (Bezet) 469 ms      P  Axis 33 degrees      R Axis -1 degrees      T Axis 199 degrees      IHS MUSE NARRATIVE AND IMPRESSION --     NORMAL SINUS RHYTHM  LEFT VENTRICULAR HYPERTROPHY  NONSPECIFIC T WAVE ABNORMALITY  ABNORMAL ECG  WHEN COMPARED WITH ECG OF 30-Jun-2022 22:04,  NONSPECIFIC T WAVE ABNORMALITY, WORSE IN LATERAL LEADS      Narrative:      NORMAL SINUS RHYTHM  LEFT VENTRICULAR HYPERTROPHY  NONSPECIFIC T WAVE ABNORMALITY  ABNORMAL ECG  WHEN COMPARED WITH ECG OF 30-Jun-2022 22:04,  NONSPECIFIC T WAVE ABNORMALITY, WORSE IN LATERAL LEADS             Assessment/Plan:   Recurrent facial drooping and right extremities weakness  - multiple MRI brain in the past negative  - MRI brain already ordered in ED. Will follow up result.   - no c/o headache, so complicated migraine seems unlikely. Will give trial with benadryl and reglan iv.   - neuro checks   - neurology consult if MRI positive. Otherwise, consider psych consult for possible conversion disorder.     Recurrent chest pain, thoracic aortic aneurysm, uncontrolled HTN  - follow up MRA chest ordered in ED to r/o dissection (without allergy due to contrast). Past MRA chest was negative.   - increase doses of antihypertensive medications  - monitor BP    - thoracic surgery referral      Seizure disorder  - continue keppra         Patient has BMI=Body mass index is 44.48 kg/m.  Diagnosis: Obesity Class 3 (formerly known as Morbid Obesity) based on BMI criteria    - weight loss counseling         Recent Labs     07/19/22  1854   Potassium 3.3*     Diagnosis: Hypokalemia   - replace with KCl      Admission status:  Observation     Signed by: Leeroy Bock, MD, MD   [id number], Stanton Kidney, NP

## 2022-07-19 NOTE — ED to IP RN Note (Addendum)
[facility] HOSPITAL EMERGENCY DEPT  ED NURSING NOTE FOR THE RECEIVING INPATIENT NURSE   ED NURSE Alcus Dad [id number]   ED CHARGE RN Toma Copier   ADMISSION INFORMATION   [name] Cook is a 47 y.o. female admitted with an ED diagnosis of:    1. Facial droop         Isolation: None   Allergies: Contrast [iodinated contrast media], Fentanyl, Fioricet [butalbital-apap-caffeine], Flexeril [cyclobenzaprine], Motrin [ibuprofen], Tramadol, Bentyl [dicyclomine], Percocet [oxycodone-acetaminophen], Shellfish-derived products, Toradol [ketorolac tromethamine], and Tylenol [acetaminophen]   Holding Orders confirmed? No   Belongings Documented? No   Home medications sent to pharmacy confirmed? No   NURSING CARE   Patient Comes From:   Mental Status: Home Independent  alert and oriented   ADL: Independent with all ADLs   Ambulation: mild difficulty   Pertinent Information  and Safety Concerns:     Broset Violence Risk Level: Low Pt went to [facility] 4 days ago when chest pain and facial droop started, was admitted for an MRI but did not get an MRI. Pt continuing to have chest pain, started to have Nausea and double vision last night. Pt has significant R-sided facial droop. States chest pain is mid-sternal and constant.   Patient currently denies  recent travel/sick contacts, fever/chills, HA, light headed/dizzy, vision changes, SOB, numbness/tingling in extremities, v/d, abdomen pain, urinary/stool issues, balance issues, or injury.   Patient A&Ox4, GCS 15, respirations regular and pt able to speak in full clear sentences, skin warm and dry.         CT / NIH   CT Head ordered on this patient?  Yes   NIH/Dysphagia assessment done prior to admission? yes   VITAL SIGNS (at the time of this note)      Vitals:    07/19/22 2027   BP: (!) 176/97   Pulse: 66   Resp: 20   Temp:    SpO2:      Pain Score: 6-moderate pain (07/19/22 1927)

## 2022-07-19 NOTE — ED Triage Notes (Signed)
Patient to ED c/o chest pain and facial droop    Pt went to [facility] 4 days ago when chest pain and facial droop started, was admitted for an MRI but did not get an MRI. Pt continuing to have chest pain, started to have Nausea and double vision last night. Pt has significant R-sided facial droop. States chest pain is mid-sternal and constant.     Patient currently denies  recent travel/sick contacts, fever/chills, HA, light headed/dizzy, vision changes, SOB, numbness/tingling in extremities, v/d, abdomen pain, urinary/stool issues, balance issues, or injury.     Patient A&Ox4, GCS 15, respirations regular and pt able to speak in full clear sentences, skin warm and dry.

## 2022-07-20 ENCOUNTER — Observation Stay: Payer: No Typology Code available for payment source

## 2022-07-20 ENCOUNTER — Encounter: Payer: Self-pay | Admitting: Internal Medicine

## 2022-07-20 LAB — POTASSIUM: Potassium: 3.3 mEq/L — ABNORMAL LOW (ref 3.5–5.3)

## 2022-07-20 LAB — MAGNESIUM: Magnesium: 2 mg/dL (ref 1.6–2.6)

## 2022-07-20 MED ORDER — PREDNISONE 20 MG PO TABS
60.0000 mg | ORAL_TABLET | Freq: Every day | ORAL | Status: DC
Start: 2022-07-20 — End: 2022-07-21
  Administered 2022-07-20 – 2022-07-21 (×2): 60 mg via ORAL
  Filled 2022-07-20 (×2): qty 3

## 2022-07-20 MED ORDER — HYDROCHLOROTHIAZIDE 25 MG PO TABS
25.0000 mg | ORAL_TABLET | Freq: Every day | ORAL | Status: DC
Start: 2022-07-20 — End: 2022-07-21
  Administered 2022-07-20 – 2022-07-21 (×2): 25 mg via ORAL
  Filled 2022-07-20 (×2): qty 1

## 2022-07-20 MED ORDER — FAMOTIDINE 20 MG PO TABS
20.0000 mg | ORAL_TABLET | Freq: Two times a day (BID) | ORAL | Status: DC
Start: 2022-07-20 — End: 2022-07-21
  Administered 2022-07-20 – 2022-07-21 (×2): 20 mg via ORAL
  Filled 2022-07-20 (×2): qty 1

## 2022-07-20 MED ORDER — METOCLOPRAMIDE HCL 5 MG/ML IJ SOLN
10.0000 mg | Freq: Once | INTRAMUSCULAR | Status: AC
Start: 2022-07-20 — End: 2022-07-20
  Administered 2022-07-20: 10 mg via INTRAVENOUS
  Filled 2022-07-20: qty 2

## 2022-07-20 MED ORDER — ATORVASTATIN CALCIUM 40 MG PO TABS
40.0000 mg | ORAL_TABLET | Freq: Every day | ORAL | Status: DC
Start: 2022-07-20 — End: 2022-07-21
  Administered 2022-07-20 – 2022-07-21 (×2): 40 mg via ORAL
  Filled 2022-07-20 (×2): qty 1

## 2022-07-20 MED ORDER — ALUM & MAG HYDROXIDE-SIMETH 200-200-20 MG/5ML PO SUSP
30.0000 mL | ORAL | Status: DC | PRN
Start: 2022-07-20 — End: 2022-07-21

## 2022-07-20 MED ORDER — LEVETIRACETAM 500 MG PO TABS
500.0000 mg | ORAL_TABLET | Freq: Two times a day (BID) | ORAL | Status: DC
Start: 2022-07-20 — End: 2022-07-21
  Administered 2022-07-20 – 2022-07-21 (×3): 500 mg via ORAL
  Filled 2022-07-20 (×3): qty 1

## 2022-07-20 MED ORDER — ENOXAPARIN SODIUM 40 MG/0.4ML IJ SOSY
40.0000 mg | PREFILLED_SYRINGE | Freq: Every day | INTRAMUSCULAR | Status: DC
Start: 2022-07-21 — End: 2022-07-21

## 2022-07-20 MED ORDER — LORAZEPAM 1 MG PO TABS
1.0000 mg | ORAL_TABLET | Freq: Once | ORAL | Status: AC | PRN
Start: 2022-07-20 — End: 2022-07-20
  Administered 2022-07-20: 1 mg via ORAL
  Filled 2022-07-20: qty 1

## 2022-07-20 MED ORDER — VALACYCLOVIR HCL 500 MG PO TABS
1000.0000 mg | ORAL_TABLET | Freq: Three times a day (TID) | ORAL | Status: DC
Start: 2022-07-20 — End: 2022-07-21
  Administered 2022-07-20 – 2022-07-21 (×3): 1000 mg via ORAL
  Filled 2022-07-20 (×6): qty 2

## 2022-07-20 MED ORDER — DIPHENHYDRAMINE HCL 50 MG/ML IJ SOLN
25.0000 mg | Freq: Once | INTRAMUSCULAR | Status: DC
Start: 2022-07-20 — End: 2022-07-20

## 2022-07-20 MED ORDER — DIPHENHYDRAMINE HCL 50 MG/ML IJ SOLN
25.0000 mg | Freq: Once | INTRAMUSCULAR | Status: AC
Start: 2022-07-20 — End: 2022-07-20
  Administered 2022-07-20: 25 mg via INTRAVENOUS
  Filled 2022-07-20: qty 1

## 2022-07-20 MED ORDER — POTASSIUM CHLORIDE CRYS ER 20 MEQ PO TBCR
40.0000 meq | EXTENDED_RELEASE_TABLET | Freq: Once | ORAL | Status: AC
Start: 2022-07-20 — End: 2022-07-20
  Administered 2022-07-20: 40 meq via ORAL
  Filled 2022-07-20: qty 2

## 2022-07-20 MED ORDER — HYDROMORPHONE HCL 0.5 MG/0.5 ML IJ SOLN
0.5000 mg | Freq: Four times a day (QID) | INTRAMUSCULAR | Status: DC | PRN
Start: 2022-07-20 — End: 2022-07-21
  Administered 2022-07-20 – 2022-07-21 (×3): 0.5 mg via INTRAVENOUS
  Filled 2022-07-20 (×3): qty 1

## 2022-07-20 MED ORDER — METHOCARBAMOL 500 MG PO TABS
500.0000 mg | ORAL_TABLET | Freq: Four times a day (QID) | ORAL | Status: DC | PRN
Start: 2022-07-20 — End: 2022-07-21
  Filled 2022-07-20: qty 1

## 2022-07-20 MED ORDER — HYDRALAZINE HCL 25 MG PO TABS
100.0000 mg | ORAL_TABLET | Freq: Three times a day (TID) | ORAL | Status: DC
Start: 2022-07-20 — End: 2022-07-20

## 2022-07-20 MED ORDER — HYDROMORPHONE HCL 0.5 MG/0.5 ML IJ SOLN
0.2000 mg | Freq: Once | INTRAMUSCULAR | Status: DC
Start: 2022-07-20 — End: 2022-07-20

## 2022-07-20 MED ORDER — METOCLOPRAMIDE HCL 5 MG/ML IJ SOLN
10.0000 mg | Freq: Once | INTRAMUSCULAR | Status: DC
Start: 2022-07-20 — End: 2022-07-20

## 2022-07-20 MED ORDER — LISINOPRIL 10 MG PO TABS
20.0000 mg | ORAL_TABLET | Freq: Every day | ORAL | Status: DC
Start: 2022-07-20 — End: 2022-07-21
  Administered 2022-07-20 – 2022-07-21 (×2): 20 mg via ORAL
  Filled 2022-07-20 (×2): qty 2

## 2022-07-20 MED ORDER — LISINOPRIL 10 MG PO TABS
10.0000 mg | ORAL_TABLET | Freq: Every day | ORAL | Status: DC
Start: 2022-07-20 — End: 2022-07-20

## 2022-07-20 MED ORDER — HYDRALAZINE HCL 25 MG PO TABS
50.0000 mg | ORAL_TABLET | Freq: Three times a day (TID) | ORAL | Status: DC
Start: 2022-07-20 — End: 2022-07-21
  Administered 2022-07-20 – 2022-07-21 (×5): 50 mg via ORAL
  Filled 2022-07-20 (×5): qty 2

## 2022-07-20 NOTE — UM Notes (Signed)
07/19/22 2202  Adult Admit to Observation  Once        Diagnosis: Facial Droop  Level of Care: Acute  Patient Class: Observation   References:    [facility] Bed Placement Criteria    [facility] Bed Placement Criteria    [facility] Bed Placement Criteria    [facility] Bed Placement Criteria    [facility] Bed Placement Criteria   Question Answer Comment   Admitting Physician Andres Labrum F    Service: Neurology    Stroke or Rule Out Stroke? Yes    Estimated Length of Stay < 2 midnights    Tentative Discharge Plan? Home or Self Care    Does patient need telemetry? Yes    Is patient 18 yrs or greater? Yes    Telemetry type (separate Telemetry order is also required): Adult telemetry    Was admission to another facility considered? Yes    Detail: Patient declined              07/20/2022 Observation Review    ED admission on 5/8  C/C: facial droop, chest pain    47 y.o. female with h/o HTN, HLD, thoracic aortic aneurysm,  morbid obesity, migraine, palpitations, stroke in 09/2013 per patient (MRI report says no evidence of an acute infarct, it showed a single focal 3 mm area of signal alteration in the cerebral white matter tracts in the left frontal region), seizure disorder (on keppra), and allergy to multiple medications who presented with right facial droop and chest pain for the last 4 days. Symptoms have been persistent. She describes the chest pain as being mid-sternal region 2 days ago, dull, persistent, radiates to bilateral neck and flanks and back. She also states she had double vision, nausea/vomiting, and passed out prior to coming to ED. She denies any headache.     Patient was admitted at [facility] on 07/16/22 for the above complaints. Her BP was markedly elevated with systolic around 230. CT head and ECHO with bubble study was unremarkable. Neurologist consult was requested,  consult note states "I knew this pt well when she was admitted to [facility] in July and September 2023 for right facial drooping and right side  weakness. The brain MRI in July 2023 was negative for stroke. Neurol exam showed her facial drooping was not consistent. Her facial weakness would spontaneously improved when she was distracted during the repeated interviews. The record at [facility] showed that she had frequent ED visit due to similar symptoms or chest pains."     VS: 181/95, 92, 20, 98.4, 97%, pain score 6/10    Abn labs: potassium 3.3    CT CAP  1. No evidence for acute intrathoracic pathology.   2. Dilatation of the ascending thoracic aorta measuring 4.3 cm.   3. Thickening involving the ascending colon extending to the hepatic   flexure compatible with colitis.     CT head  No intracranial hemorrhage or acute intracranial findings.     X ray chest  No acute intrathoracic process.       Admitted to Obs floor under Obs status    MD plan  Recurrent facial drooping and right extremities weakness  - multiple MRI brain in the past negative  - MRI brain already ordered in ED. Will follow up result.   - no c/o headache, so complicated migraine seems unlikely. Will give trial with benadryl and reglan iv.   - neuro checks   - neurology consult if MRI positive. Otherwise,  consider psych consult for possible conversion disorder.      Recurrent chest pain, thoracic aortic aneurysm, uncontrolled HTN  - follow up MRA chest ordered in ED to r/o dissection (without allergy due to contrast). Past MRA chest was negative.   - increase doses of antihypertensive medications  - monitor BP    - thoracic surgery referral      Seizure disorder  - continue keppra    Patient has BMI=Body mass index is 44.48 kg/m.  Diagnosis: Obesity Class 3 (formerly known as Morbid Obesity) based on BMI criteria    - weight loss counseling      Diagnosis: Hypokalemia   - replace with KCl       Scheduled Meds:  Current Facility-Administered Medications   Medication Dose Route Frequency    atorvastatin  40 mg Oral Daily    hydrALAZINE  50 mg Oral Q8H    hydroCHLOROthiazide  25 mg Oral Daily     levETIRAcetam  500 mg Oral Q12H SCH    lisinopril  20 mg Oral Daily     Continuous Infusions:  PRN Meds:.alum & mag hydroxide-simethicone, methocarbamol     Amaryllis Dyke, RN MSN  Utilization Review  [employer]  [address]  [address] [place], [place] [address]  Phone: [phone]

## 2022-07-20 NOTE — Progress Notes (Signed)
07/20/22 1601   Case Management Quick Doc   Acknowledgment of Outpatient/Observation Observation letter given   CMA Tasks   CMA tasks MOON delivered     1559pm CMS called and spoke to [place] - leaving copies in chart

## 2022-07-20 NOTE — Plan of Care (Signed)
Stroke Neurology Team Plan of Care:    MRI brain reviewed and without acute infarct or pathology. No further stroke or TIA workup recommended at this time. Low suspicion for acute neurovascular process at this time as symptoms ongoing x 4 days with similar presentations in past (>10) with negative workups and inconsistent exams. Defer further management per primary team.    Please call [phone] for any questions/concerns.     Signed by:  Linton Rump, PA-C  Neurology - Stroke [facility] Ext/Spectra #: [phone]

## 2022-07-20 NOTE — Plan of Care (Addendum)
Adult Observation Progress Note      Shift Note: Received report from outgoing RN, introduced self to patient, and updated white board. Patient lying on bed, awake, alert, not in any form of distress. Complains of chest pain rated 5/10 radiating to back and jaw and she said its the same thing since she was admitted.   Neuro: A&Ox4. with rt. sided body weakness and rt. facial droop  Cardio: Tele is in place, NSR with PVC's , (+) chest pain  Resp: Clear on RA  Integ: Intact, with old scar in abdomen  MSK:  High fall level, safety precautions are in place. Pt ambulates independently with standby assist  GI: Continent. LBM 07/19/2022   . Bowel sounds present.  GU: Continent.  IV Access: IVC at rt ac g. 18 noted intact and patent    Note: pt. has continuous chest pain with score of 6-8/10 and prefers to take dilaudid. Refused robaxin.     Pending Orders:  - neurology follow up  - OT/PT/SLP eval  - Neuro check q 4  - NIHSS (4)    Discharge Plan: TBD, Pending MD Clearance.    Social/Family Visits: Not at this time    POC update: Pt was updated and verbalized understanding.         Problem: Pain interferes with ability to perform ADL  Goal: Pain at adequate level as identified by patient  Flowsheets (Taken 07/20/2022 2141)  Pain at adequate level as identified by patient:   Identify patient comfort function goal   Assess for risk of opioid induced respiratory depression, including snoring/sleep apnea. Alert healthcare team of risk factors identified.   Assess pain on admission, during daily assessment and/or before any "as needed" intervention(s)   Reassess pain within 30-60 minutes of any procedure/intervention, per Pain Assessment, Intervention, Reassessment (AIR) Cycle   Evaluate if patient comfort function goal is met   Evaluate patient's satisfaction with pain management progress   Offer non-pharmacological pain management interventions   Consult/collaborate with Pain Service   Include patient/patient care companion in  decisions related to pain management as needed     Problem: Side Effects from Pain Analgesia  Goal: Patient will experience minimal side effects of analgesic therapy  Flowsheets (Taken 07/20/2022 2141)  Patient will experience minimal side effects of analgesic therapy:   Monitor/assess patient's respiratory status (RR depth, effort, breath sounds)   Assess for changes in cognitive function   Prevent/manage side effects per LIP orders (i.e. nausea, vomiting, pruritus, constipation, urinary retention, etc.)   Evaluate for opioid-induced sedation with appropriate assessment tool (i.e. POSS)     Problem: Moderate/High Fall Risk Score >5  Goal: Patient will remain free of falls  Flowsheets (Taken 07/20/2022 2141)  High (Greater than 13):   HIGH-Visual cue at entrance to patient's room   HIGH-Bed alarm on at all times while patient in bed   HIGH-Utilize chair pad alarm for patient while in the chair   HIGH-Apply yellow "Fall Risk" arm band   HIGH-Pharmacy to initiate evaluation and intervention per protocol   HIGH-Initiate use of floor mats as appropriate   HIGH-Consider use of low bed     Problem: Day of Admission - Stroke  Goal: Core/Quality measure requirements - Admission  Flowsheets (Taken 07/20/2022 2141)  Core/Quality measure requirements - Admission:   Document nursing swallow/dysphagia screen on admission. If patient fails, keep patient NPO (follow your hospital protocol on swallowing screening).   Ensure antithrombotic administered or contraindication documented by LIP  VTE Prevention: Ensure anticoagulant(s) administered and/or anti-embolism stockings/devices documented as ordered   If diagnosis or history of Atrial Fib/Atrial Flutter, ensure oral anticoagulation is initiated or contraindication documented by LIP   Ensure lipid panel ordered     Problem: Every Day - Stroke  Goal: Neurological status is stable or improving  Flowsheets (Taken 07/20/2022 2141)  Neurological status is stable or improving:    Monitor/assess/document neurological assessment (Stroke: every 4 hours)   Monitor/assess NIH Stroke Scale   Re-assess NIH Stroke Scale for any change in status   Observe for seizure activity and initiate seizure precautions if indicated   Perform CAM Assessment  Goal: Mobility/Activity is maintained at optimal level for patient  Flowsheets (Taken 07/20/2022 2141)  Mobility/activity is maintained at optimal level for patient:   Encourage independent activity per ability   Consult/collaborate with Physical Therapy and/or Occupational Therapy  Goal: Patient will maintain adequate oxygenation  Flowsheets (Taken 07/20/2022 2141)  Patient will maintain adequate oxygenation: Maintain SpO2 of greater than 92%     Problem: Neurological Deficit  Goal: Neurological status is stable or improving  Flowsheets (Taken 07/20/2022 2141)  Neurological status is stable or improving:   Monitor/assess/document neurological assessment (Stroke: every 4 hours)   Monitor/assess NIH Stroke Scale   Re-assess NIH Stroke Scale for any change in status   Observe for seizure activity and initiate seizure precautions if indicated   Perform CAM Assessment     Problem: Potential for Aspiration  Goal: Risk of aspiration will be minimized  Flowsheets (Taken 07/20/2022 2141)  Risk of aspiration will be minimized:   Place patient up in chair to eat, if possible/head of bed up 90 degrees to eat if unable to be out of bed   Instruct patient to take small bites, small single sips of liquid, and do not use a straw     Problem: Impaired Mobility  Goal: Mobility/Activity is maintained at optimal level for patient  Flowsheets (Taken 07/20/2022 2141)  Mobility/activity is maintained at optimal level for patient:   Encourage independent activity per ability   Consult/collaborate with Physical Therapy and/or Occupational Therapy     Problem: Communication Impairment  Goal: Will be able to express needs and understand communication  Flowsheets (Taken 07/20/2022 2141)  Able to  express needs and understand communication: Patient/patient care companion demonstrates understanding on disease process, treatment plan, medications and discharge plan

## 2022-07-20 NOTE — Progress Notes (Signed)
[facility]   [facility] HOSPITALIST PROGRESS NOTE    Today's date & time: 07/20/22 4:03 PM  Patient Name: [name] Cook  Attending Physician: Viviann Spare, MD  Admission Date: 07/19/2022    Assessment:     Active Hospital Problems    Diagnosis    Chest pain, unspecified    HTN (hypertension)    History of seizure disorder    Right hemiparesis    HLD (hyperlipidemia)    Morbid obesity with BMI of 45.0-49.9, adult    Facial droop     Plan:   # Right-sided facial weakness   #Possible Bell's palsy  Will place on valacyclovir and prednisone  Monitor closely  MRI negative for acute strokes    # History of stroke  - currently on: atorvastatin 40 mg po daily  - discontinued: clopidogrel 75 mg po  - start: enoxaparin 100 mg/mL 40 mg sc daily (due 07/21/22)  MRI of the brain done overnight did not reveal any acute issues  Continue current aspirin     # Hypertension  - 24hr BP range: (127-181) / (75-97) (07/20/22)  - continue home: hydralazine 50 mg po q8h  - continue home: hydrochlorothiazide 25 mg po daily  - continue home: lisinopril 20 mg po daily  - discontinued: amlodipine 10 mg po, nitroglycerin 0.4 mg SL  Blood pressure is better at this time     # Obesity, class III: severe  - BMI: 44.5 (07/19/22)  - complicated by GERD, hyperlipidemia, and hypertension     # GERD  - discontinued: pantoprazole 40 mg iv  - start: famotidine 20 mg po BID (due 07/20/22)     # Anemia: chronic, normocytic  - hemoglobin: 14.4 (07/19/22)  - mean corpuscular volume: 96 (07/19/22)  - est. baseline hemoglobin: 13.7 g/dL     # Hyperlipidemia: with clinical ASCVD  - currently on: atorvastatin 40 mg po daily     # Hypokalemia: mild to moderate, stable  - creatinine clearance: 80, creatinine: 0.9 (07/19/22)  - potassium: 3.3 (07/20/22) unchanged from 3.3 (07/19/22)  - completed: potassium chloride 40 mEq po  Replacement given, follow closely     # Cervical cancer    #Chest pain  Cardiac enzymes are negative  No EKG changes  MRA of the chest showing  slight aneurysm no dissection  Pepcid, nitroglycerin as needed.           GI prophylaxis:     Subjective     CC: Facial droop    Interval History/24 hour events: Patient is saying she is feeling a bit better today    HPI: HPI per Admitting Provider   " [name] Cook is a 47 y.o. female with h/o HTN, HLD, thoracic aortic aneurysm,  morbid obesity, migraine, palpitations, stroke in 09/2013 per patient (MRI report says no evidence of an acute infarct, it showed a single focal 3 mm area of signal alteration in the cerebral white matter tracts in the left frontal region), seizure disorder (on keppra), and allergy to multiple medications who presented to the emergency room on 07/19/2022 with right facial droop and chest pain for the last 4 days. Symptoms have been persistent. She describes the chest pain as being mid-sternal region 2 days ago, dull, persistent, radiates to bilateral neck and flanks and back. She also states she had double vision, nausea/vomiting, and passed out prior to coming to ED. She denies any headache.      Patient was admitted at [facility] on  07/16/22 for the above complaints. Her BP was markedly elevated with systolic around 230. CT head and ECHO with bubble study was unremarkable. Neurologist consult was requested,  consult note states "I knew this pt well when she was admitted to [facility] in July and September 2023 for right facial drooping and right side weakness. The brain MRI in July 2023 was negative for stroke. Neuro exam showed her facial drooping was not consistent. Her facial weakness would spontaneously improved when she was distracted during the repeated interviews. The record at [facility] showed that she had frequent ED visit due to similar symptoms or chest pains."     Admission to [facility] in 06/2022 for chest pain and hypertensive urgency. CT chest and then MRA chest done to rule out aortic dissection negative. Prior multiple MRI and MRA chest in Dec/2023 negative. MRI brain  and MRA head&neck in Dec/2023 negative for stroke.  "     Hospital course:       Review of Systems:   General - some mid chest pain   Resp - no SOB   CVS - no CP, no CAD   GI - no nausea, no diarrhea   CNS - no visual deficit, no weakness    Physical Exam:     Vitals:    07/20/22 0707 07/20/22 1120 07/20/22 1338 07/20/22 1600   BP: 131/75 134/82 127/90 156/86   Pulse: 69 (!) 57  78   Resp: 16 14     Temp: 97.9 F (36.6 C) 97.9 F (36.6 C)  97.9 F (36.6 C)   TempSrc: Oral Oral  Oral   SpO2: 100% 99%  95%   Weight:       Height:            No intake or output data in the 24 hours ending 07/20/22 1603    General: Awake, alert, oriented x 3; no acute distress.  HEENT: PERRLA, eomi, sclera anicteric, oropharynx clear without lesions, mucous membranes moist  Neck: supple, no lymphadenopathy, no thyromegaly, no JVD, no carotid bruits  Cardiovascular: regular rate and rhythm, no murmurs, rubs or gallops  Lungs: clear to auscultation bilaterally, without wheezing, rhonchi, or rales  Abdomen: soft, non-tender, non-distended; no palpable masses, no hepatosplenomegaly, normoactive bowel sounds, no rebound or guarding  Extremities: no clubbing, cyanosis, or edema  Neuro: cranial nerves grossly intact except right-sided facial weakness mild right-sided neck weakness, strength 5/5 in upper and lower extremities right side 4/5, sensation intact, gross visual field test intact.  Skin: no rashes or lesions noted  GU: no CVA tenderness       Meds:   Medications were reviewed by me:  Current Facility-Administered Medications   Medication Dose Route Frequency    atorvastatin  40 mg Oral Daily    [START ON 07/21/2022] enoxaparin  40 mg Subcutaneous Daily    famotidine  20 mg Oral Q12H SCH    hydrALAZINE  50 mg Oral Q8H    hydroCHLOROthiazide  25 mg Oral Daily    levETIRAcetam  500 mg Oral Q12H SCH    lisinopril  20 mg Oral Daily    predniSONE  60 mg Oral Daily    valACYclovir HCL  1,000 mg Oral Q8H       Labs:   Labs reviewed  personally include:  Recent Labs   Lab 07/19/22  1854   WBC 9.24   Hgb 14.4   Hematocrit 44.9*   Platelets 158  Recent Labs   Lab 07/20/22  0540 07/19/22  1854   Sodium  --  143   Potassium 3.3* 3.3*   Chloride  --  107   CO2  --  26   BUN  --  13.0   Creatinine  --  0.9   eGFR  --  >60.0   Glucose  --  99   Calcium  --  9.6    Recent Labs   Lab 07/19/22  1854   Alkaline Phosphatase 115   Bilirubin, Total 0.8   Protein, Total 8.3   Albumin 4.1   ALT 23   AST (SGOT) 23            Microbiology Results (last 15 days)       ** No results found for the last 360 hours. **            Imaging personally reviewed, including:  Radiology Results (24 Hour)       Procedure Component Value Units Date/Time    MRA Chest WO Contrast [[id number]] Collected: 07/20/22 1347    Order Status: Completed Updated: 07/20/22 1405    Narrative:      HISTORY: Chest pain, thoracic aortic aneurysm    COMPARISON: MRA chest from 07/01/2022, CT chest from 07/19/2022    TECHNIQUE: MRA imaging of the chest was performed without  intravenous  contrast. Multiplanar reformatted and 3D maximum projection intensity (MIP)  reconstruction images were created and reviewed.    CONTRAST: None.     FINDINGS:    Evaluation is limited given lack of intravenous contrast. Additionally the  patient did not tolerate the entire examination and the examination was  truncated. This examination is limited given flow-related artifact on  FIESTA imaging.    There is redemonstration of mild aneurysmal dilatation of the ascending  aorta measuring up to 4.1 cm. The aortic arch is left-sided. There is  three-vessel branching of the aortic arch. There is no apparent dissection  flap seen in the thoracic aorta in the limited images to suggest aortic  dissection. The heart size is normal. There is no pleural or pericardial  effusion. There is a 1.6 cm simple left renal cyst.      Impression:          1. No evidence for a thoracic aortic dissection on these  limited  noncontrast MRA images.   2. Mild aneurysmal dilatation of the ascending aorta.    Carolyn Stare, MD  07/20/2022 2:02 PM    MRI Brain WO Contrast [[id number]] Collected: 07/20/22 1345    Order Status: Completed Updated: 07/20/22 1349    Narrative:      HISTORY: Neurologic deficit. Stroke suspected. Recurrent facial droop.    COMPARISON: 07/19/2022 CT head and 07/29/2021 MRI brain exams.     TECHNIQUE: MRI of the brain performed on a 3.0 Tesla scanner without  intravenous contrast.    CONTRAST: None.    FINDINGS:   No acute intracranial hemorrhage. No intracranial mass, mass effect or  midline shift. The ventricles are within normal size limits few scattered  periventricular and subcortical T2 FLAIR hyperintense lesions in the  supratentorial white matter most prominent in the frontal lobes, consistent  with chronic injury. Stable appearance compared to the 07/29/2021 MRI. No  diffusion restriction to suggest recent infarct. The major intracranial  flow voids are maintained. No suspicious areas of increased susceptibility.      The orbits are unremarkable. Mild polypoid mucosal thickening in the right  maxillary antrum. Otherwise, the visualized paranasal sinuses and the  bilateral mastoid air cells are clear.      Impression:          1. No acute intracranial finding.    2. Few nonspecific supratentorial white matter lesions as detailed above  consistent with chronic injury. These may reflect mild chronic small vessel  ischemic changes, or sequela of previous infection/inflammation or  migraines for instance.    Gaylyn Rong, MD  07/20/2022 1:47 PM    CT Head WO Contrast [[id number]] Collected: 07/19/22 2109    Order Status: Completed Updated: 07/19/22 2114    Narrative:      HISTORY: Facial droop r side weakness.     COMPARISON: MRI brain 07/29/2021    TECHNIQUE: CT of the head performed without intravenous contrast. The  following dose reduction techniques were utilized: automated exposure  control and/or  adjustment of the mA and/or KV according to patient size,  and the use of an iterative reconstruction technique.    CONTRAST: None.    FINDINGS:  No midline shift, mass effect, parenchymal hemorrhage, or evidence of acute  territorial infarction.    No evidence of hydrocephalus. No extra-axial fluid collections.    No significant paranasal sinus disease. The mastoids are clear. The  calvarium is intact.      Impression:         No intracranial hemorrhage or acute intracranial findings.    Brenton Grills, MD  07/19/2022 9:12 PM    CT Chest Abdomen Pelvis WO Contrast [[id number]] Collected: 07/19/22 2049    Order Status: Completed Updated: 07/19/22 2112    Narrative:      HISTORY:  Chest and back pain, history of dilated mid ascending thoracic  aorta    COMPARISON: 06/30/2022    TECHNIQUE: CT of the chest, abdomen, and pelvis performed without  intravenous contrast. The following dose reduction techniques were  utilized: automated exposure control and/or adjustment of the mA and/or KV  according to patient size, and the use of an iterative reconstruction  technique.    CONTRAST: None.    FINDINGS:  Please note that evaluation of the viscera and vasculature is limited in  the absence of IV contrast.    Chest:  There is dilatation of the ascending thoracic aorta with a transluminal  diameter of 4.3 cm. There is no adenopathy. There is enlargement of the  left thyroid lobe. There are no infiltrates or effusions. There are regions  of mild scarring within the medial aspect of the right lower lobe, lingula,  medial aspect of the right lower lobe and basilar aspect of the left lower  lobe. No suspicious pulmonary nodules are identified. There is a small  hiatal hernia.    Abdomen/Pelvis:  The gallbladder and uterus are surgically absent. The liver, spleen, right  kidney, adrenal glands, and pancreas are normal. There is a cyst within the  upper pole of the left kidney measuring 1.3 cm. There is no bowel  dilatation. There is  thickening involving the ascending colon extending to  the hepatic flexure. The small bowel is unremarkable. There is no free  fluid or adenopathy. There is a midline anterior abdominal wall scar.      Impression:        1. No evidence for acute intrathoracic pathology.  2. Dilatation of the ascending thoracic aorta measuring 4.3 cm.  3. Thickening involving the ascending colon extending to the hepatic  flexure compatible with colitis.  Collene Schlichter, MD  07/19/2022 9:09 PM    XR Chest 2 Views [[id number]] Collected: 07/19/22 1900    Order Status: Completed Updated: 07/19/22 1903    Narrative:      HISTORY: Chest pain     COMPARISON: 06/30/2022    FINDINGS:   Cardiomediastinal silhouette is normal. No pulmonary edema. No focal  consolidation, pleural effusion or pneumothorax.      Impression:        No acute intrathoracic process.    Shelly Flatten, MD  07/19/2022 7:01 PM            Safety Checklist:     DVT prophylaxis:  CHEST guideline (See page [id number]) Chemical   Foley:  [place] Rn Foley protocol Not present   IVs:  Peripheral IV   PT/OT: Ordered   Daily CBC & or Chem ordered:  SHM/ABIM guidelines (see #5) No         Lines:          Patient Lines/Drains/Airways Status       Active PICC Line / CVC Line / PIV Line / Drain / Airway / Intraosseous Line / Epidural Line / ART Line / Line / Wound / Pressure Ulcer / NG/OG Tube       None                        Patient has BMI=Body mass index is 44.48 kg/m.  Diagnosis: Obesity Class 3 (formerly known as Morbid Obesity) based on BMI criteria            Recent Labs     07/20/22  0540 07/19/22  1854   Potassium 3.3* 3.3*     Diagnosis: Hypokalemia        Disposition:   Today's date: 07/20/2022  Length of Stay: 0  Anticipated medical stability for discharge: May,  10 - Afternoon  Reason for ongoing hospitalization: Right-sided facial weakness  Anticipated discharge needs: Home   I have spent 34 minutes with the patient counseling and coordinating care    Signed by: Viviann Spare, MD

## 2022-07-20 NOTE — Progress Notes (Addendum)
NURSING ADMISSION NOTE     Date Time:  07/19/22 2300  Patient Name: [name] Cook  Attending Physician: Andres Labrum     Date of Admission:   07/19/22     Reason for Admission:   Facial droop     Admitted from:   ED     Nursing Note:   Patient Aox4. NIHSS is 4. Pt still had facial droop and right sided weakness.English speaking, and VSS. On tele, NSR w/PVC, confirmed with [facility], and alerted Charge Nurse Victorino Dike. Oriented patient to the floor and POC. Bed to low position. Call bell within reach.      Patient scores as low/mod/high: high. Patient ambulates: standby.  Bed alarm on and floor mats in place. Patient belongings reviewed. Patient reports pain as 6/10 and requested dilaudid, MD notified. MD ordered robaxin. Pt refused saying she never taken that med she wants dilaudid. MD is aware. Safety interventions in place. Will continue to monitor.    4 eyes in 4 hours pressure injury assessment note:      Completed with: Karla  Unit & Time admitted: NT-6 2300             Bony Prominences: Check appropriate box; if wound is present enter wound assessment in LDA     Occiput:                 [x] WNL  []  Wound present  Face:                     [x] WNL  []  Wound present  Ears:                      [x] WNL  []  Wound present  Spine:                    [x] WNL  []  Wound present  Shoulders:             [x] WNL  []  Wound present  Elbows:                  [x] WNL  []  Wound present  Sacrum/coccyx:     [x] WNL  []  Wound present  Ischial Tuberosity:  [x] WNL  []  Wound present  Trochanter/Hip:      [x] WNL  []  Wound present  Knees:                   [x] WNL  []  Wound present  Ankles:                   [x] WNL  []  Wound present  Heels:                    [x] WNL  []  Wound present  Other pressure areas:  []  Wound location       Device related: []  Device name:         LDA completed if wound present: yes/no  Consult WOCN if necessary    Other skin related issues, ie tears, rash, etc, document in Integumentary flowsheet

## 2022-07-20 NOTE — Progress Notes (Addendum)
Adult Observation Progress Note        Shift Note:    General: Introduced self to patient and updated whiteboard w POC  Neuro: AAOx4, English speaking; denies HA/dizziness, numbness/tingling, R facial droop present  Cardio: SB on tele, reports moderate to severe pain at L chest radiating to R chest and back, describes as pressure pain  Resp: Clear on RA, denies SOB  Integ: Scars to abd  MSK: Standby assist, high falls per protocol; RLE weakness present  GI: Continent, LBM 5/8; hh diet   GU: Continent  IV Access: 18 g RAC, cdi     Events during shift: am rounds, MRI brain, MRI chest, stroke neuro consult    BM during shift: no     Pending Orders: NIH, q4h neuro checks, tele, PT/OT, SLP    Discharge Plan: TBD    Social/Family Visits: none    POC update: patient updated on POC

## 2022-07-20 NOTE — Plan of Care (Signed)
Problem: Pain interferes with ability to perform ADL  Goal: Pain at adequate level as identified by patient  Outcome: Progressing  Flowsheets (Taken 07/20/2022 0522 by Carlean Purl, RN)  Pain at adequate level as identified by patient:   Identify patient comfort function goal   Assess for risk of opioid induced respiratory depression, including snoring/sleep apnea. Alert healthcare team of risk factors identified.   Assess pain on admission, during daily assessment and/or before any "as needed" intervention(s)   Reassess pain within 30-60 minutes of any procedure/intervention, per Pain Assessment, Intervention, Reassessment (AIR) Cycle   Evaluate if patient comfort function goal is met   Evaluate patient's satisfaction with pain management progress   Offer non-pharmacological pain management interventions   Consult/collaborate with Pain Service   Consult/collaborate with Physical Therapy, Occupational Therapy, and/or Speech Therapy   Include patient/patient care companion in decisions related to pain management as needed     Problem: Side Effects from Pain Analgesia  Goal: Patient will experience minimal side effects of analgesic therapy  Outcome: Progressing  Flowsheets (Taken 07/20/2022 0522 by Carlean Purl, RN)  Patient will experience minimal side effects of analgesic therapy:   Monitor/assess patient's respiratory status (RR depth, effort, breath sounds)   Prevent/manage side effects per LIP orders (i.e. nausea, vomiting, pruritus, constipation, urinary retention, etc.)   Assess for changes in cognitive function   Evaluate for opioid-induced sedation with appropriate assessment tool (i.e. POSS)     Problem: Moderate/High Fall Risk Score >5  Goal: Patient will remain free of falls  Outcome: Progressing  Flowsheets  Taken 07/20/2022 1710  VH High Risk (Greater than 13):   ALL REQUIRED MODERATE INTERVENTIONS   ALL REQUIRED LOW INTERVENTIONS   RED "HIGH FALL RISK" SIGNAGE   BED ALARM WILL BE ACTIVATED WHEN THE  PATEINT IS IN BED WITH SIGNAGE "RESET BED ALARM"   A CHAIR PAD ALARM WILL BE USED WHEN PATIENT IS UP SITTING IN A CHAIR   PATIENT IS TO BE SUPERVISED FOR ALL TOILETING ACTIVITIES   A safety companion may be used when deemed appropriate by the Primary RN and Clinical Administrator   Keep door open for better visibility   Include family/significant other in multidisciplinary discussion regarding plan of care as appropriate   Use of roll guard   Use of "STOP ask for help" sign   Use of floor mat   Use chair-pad alarm device   Use assistive devices   Request PT/OT therapy consult order from physician for patients with gait/mobility impairment  Taken 07/20/2022 0801  High (Greater than 13):   HIGH-Bed alarm on at all times while patient in bed   HIGH-Utilize chair pad alarm for patient while in the chair   HIGH-Visual cue at entrance to patient's room   HIGH-Apply yellow "Fall Risk" arm band   HIGH-Pharmacy to initiate evaluation and intervention per protocol   HIGH-Initiate use of floor mats as appropriate   HIGH-Consider use of low bed

## 2022-07-20 NOTE — Plan of Care (Signed)
Problem: Pain interferes with ability to perform ADL  Goal: Pain at adequate level as identified by patient  Outcome: Progressing  Flowsheets (Taken 07/20/2022 0522)  Pain at adequate level as identified by patient:   Identify patient comfort function goal   Assess for risk of opioid induced respiratory depression, including snoring/sleep apnea. Alert healthcare team of risk factors identified.   Assess pain on admission, during daily assessment and/or before any "as needed" intervention(s)   Reassess pain within 30-60 minutes of any procedure/intervention, per Pain Assessment, Intervention, Reassessment (AIR) Cycle   Evaluate if patient comfort function goal is met   Evaluate patient's satisfaction with pain management progress   Offer non-pharmacological pain management interventions   Consult/collaborate with Pain Service   Consult/collaborate with Physical Therapy, Occupational Therapy, and/or Speech Therapy   Include patient/patient care companion in decisions related to pain management as needed     Problem: Side Effects from Pain Analgesia  Goal: Patient will experience minimal side effects of analgesic therapy  Outcome: Progressing  Flowsheets (Taken 07/20/2022 0522)  Patient will experience minimal side effects of analgesic therapy:   Monitor/assess patient's respiratory status (RR depth, effort, breath sounds)   Prevent/manage side effects per LIP orders (i.e. nausea, vomiting, pruritus, constipation, urinary retention, etc.)   Assess for changes in cognitive function   Evaluate for opioid-induced sedation with appropriate assessment tool (i.e. POSS)     Problem: Moderate/High Fall Risk Score >5  Goal: Patient will remain free of falls  Outcome: Progressing  Flowsheets (Taken 07/20/2022 0100)  High (Greater than 13):   HIGH-Bed alarm on at all times while patient in bed   HIGH-Apply yellow "Fall Risk" arm band   HIGH-Initiate use of floor mats as appropriate     Problem: Day of Admission - Stroke  Goal:  Core/Quality measure requirements - Admission  Outcome: Progressing  Flowsheets (Taken 07/20/2022 0522)  Core/Quality measure requirements - Admission:   Document nursing swallow/dysphagia screen on admission. If patient fails, keep patient NPO (follow your hospital protocol on swallowing screening).   Document NIH Stroke Scale on admission   VTE Prevention: Ensure anticoagulant(s) administered and/or anti-embolism stockings/devices documented as ordered   Ensure antithrombotic administered or contraindication documented by LIP   Ensure lipid panel ordered   If diagnosis or history of Atrial Fib/Atrial Flutter, ensure oral anticoagulation is initiated or contraindication documented by LIP   Begin stroke education on admission (must include Modifiable Risk Factors, Warning Signs and Symptoms of Stroke, Activation of Emergency Medical System and Follow-up Appointments) Ensure handout has been given and documented.   Ensure PT/OT and/or SLP ordered

## 2022-07-21 DIAGNOSIS — I200 Unstable angina: Secondary | ICD-10-CM

## 2022-07-21 DIAGNOSIS — Z8669 Personal history of other diseases of the nervous system and sense organs: Secondary | ICD-10-CM

## 2022-07-21 DIAGNOSIS — R29810 Facial weakness: Secondary | ICD-10-CM

## 2022-07-21 DIAGNOSIS — I10 Essential (primary) hypertension: Secondary | ICD-10-CM

## 2022-07-21 DIAGNOSIS — E7800 Pure hypercholesterolemia, unspecified: Secondary | ICD-10-CM

## 2022-07-21 LAB — BASIC METABOLIC PANEL
Anion Gap: 10 (ref 5.0–15.0)
BUN: 13 mg/dL (ref 7.0–21.0)
CO2: 20 mEq/L (ref 17–29)
Calcium: 9.3 mg/dL (ref 8.5–10.5)
Chloride: 107 mEq/L (ref 99–111)
Creatinine: 1 mg/dL (ref 0.4–1.0)
Glucose: 202 mg/dL — ABNORMAL HIGH (ref 70–100)
Potassium: 4.3 mEq/L (ref 3.5–5.3)
Sodium: 137 mEq/L (ref 135–145)
eGFR: >60 mL/min/{1.73_m2} (ref >=60)

## 2022-07-21 MED ORDER — FAMOTIDINE 20 MG PO TABS
20.0000 mg | ORAL_TABLET | Freq: Two times a day (BID) | ORAL | 0 refills | Status: DC
Start: 2022-07-21 — End: 2022-10-30

## 2022-07-21 MED ORDER — ENOXAPARIN SODIUM 80 MG/0.8ML IJ SOSY
0.5000 mg/kg | PREFILLED_SYRINGE | Freq: Every day | INTRAMUSCULAR | Status: DC
Start: 2022-07-21 — End: 2022-07-21
  Administered 2022-07-21: 70 mg via SUBCUTANEOUS
  Filled 2022-07-21: qty 0.8

## 2022-07-21 MED ORDER — HYDROMORPHONE HCL 2 MG PO TABS
2.0000 mg | ORAL_TABLET | Freq: Four times a day (QID) | ORAL | 0 refills | Status: AC | PRN
Start: 2022-07-21 — End: 2022-07-28

## 2022-07-21 MED ORDER — HYDROMORPHONE HCL 0.5 MG/0.5 ML IJ SOLN
0.5000 mg | INTRAMUSCULAR | Status: DC | PRN
Start: 2022-07-21 — End: 2022-07-21
  Administered 2022-07-21 (×2): 0.5 mg via INTRAVENOUS
  Filled 2022-07-21 (×2): qty 1

## 2022-07-21 MED ORDER — ONDANSETRON 4 MG PO TBDP
4.0000 mg | ORAL_TABLET | Freq: Three times a day (TID) | ORAL | 0 refills | Status: DC | PRN
Start: 2022-07-21 — End: 2022-10-30

## 2022-07-21 MED ORDER — PREDNISONE 10 MG PO TABS
ORAL_TABLET | ORAL | 0 refills | Status: AC
Start: 2022-07-22 — End: 2022-07-30

## 2022-07-21 MED ORDER — VALACYCLOVIR HCL 1 G PO TABS
1000.0000 mg | ORAL_TABLET | Freq: Three times a day (TID) | ORAL | 0 refills | Status: DC
Start: 2022-07-21 — End: 2022-09-10

## 2022-07-21 NOTE — Progress Notes (Signed)
Adult Observation Progress Note    Shift Note:    General: Patient VSS, in no apparent distress at this time.  Neuro: Patient is AOx4. Patient denies numbness or tingling. Perrla.   Cardio: SR with PVC on telemetry, verified with [facility]. S1, S2 auscultated.   Resp: Patient on RA with lung sounds clear bilaterally on auscultation.   Integ: Skin intact, no wounds or edema noted.   MSK: Patient ambulates: independently with: standby assist. low/mod/high: High falls risk.   GI: Bowel sounds auscultated all 4 quadrants.   GU: Patient is continent x 2.   IV Access: IV: 18g  in: RAC.     No significant events or provider communication. Patient rates her chest pain as 2at this time, and is resting comfortably in bed.    BM during shift: YES/NO: no  Last BM: 5/8    Plan: [employer], [employer], [employer], Pt/OT, NIH, Neuro q4  Oral Prednisone and Valtrex    Discharge Plan: TBD, pending medical clearance    POC update: pt updated with POC during bedside report

## 2022-07-21 NOTE — SLP Eval Note (Addendum)
[facility]   Speech Language Pathology  SLP Clinical Bedside Swallow Evaluation     Patient: [name] Cook MRN#: [id number] Room: V409/W119.14    Recommendations/Plan:   Recommendations:  Solids:  Regular Solids (RG7)  Liquids: Thin Liquids (IDDSI TN0) via, any modality  Meds:       Precautions:   Precautions/Compensations: alert and awake, upright positioning  Supervision: Independent    Plan:   Referrals: none  Plan of care: begin/continue oral diet     Discharge recommendations: Defer to PT/OT recommendation    Assessment:   Swallow evaluation complete. Patient was seen upright in bed with no family present. RN reports patient tolerating regular diet. Vocal quality is clear and audible. OME is remarkable for right sided facial droop/weakness. Oral phase is characterized by complete labial seal and functional mastication of hard dry solids. 1-2 swallows necessary per bolus. Laryngeal rise palpable. No overt s/s of aspiration across consistencies. Recommend patient continue PO diet of regular solids and thin liquids with medication whole with liquids. No further skilled SLP services necessary at this time. Re consult for status change.      Therapy Diagnosis: oropharyngeal dysphagia    Prognosis: good    History of Present Illness:   History of Present Illness: [name] Cook is a 47 y.o. female admitted on 07/19/2022 with "h/o HTN, HLD, thoracic aortic aneurysm,  morbid obesity, migraine, palpitations, stroke in 09/2013 per patient (MRI report says no evidence of an acute infarct, it showed a single focal 3 mm area of signal alteration in the cerebral white matter tracts in the left frontal region), seizure disorder (on keppra), and allergy to multiple medications who presented with right facial droop and chest pain for the last 4 days. Symptoms have been persistent. She describes the chest pain as being mid-sternal region 2 days ago, dull, persistent, radiates to bilateral neck and flanks and back. She  also states she had double vision, nausea/vomiting, and passed out prior to coming to ED. She denies any headache.      Patient was admitted at [facility] on 07/16/22 for the above complaints. Her BP was markedly elevated with systolic around 230. CT head and ECHO with bubble study was unremarkable. Neurologist consult was requested,  consult note states "I knew this pt well when she was admitted to [facility] in July and September 2023 for right facial drooping and right side weakness. The brain MRI in July 2023 was negative for stroke. Neurol exam showed her facial drooping was not consistent. Her facial weakness would spontaneously improved when she was distracted during the repeated interviews. The record at [facility] showed that she had frequent ED visit due to similar symptoms or chest pains."     Admission to [facility] in 06/2022 for chest pain and hypertensive urgency. CT chest and then MRA chest done to rule out aortic dissection negative. Prior multiple MRI and MRA chest in Dec/2023 negative. MRI brain and MRA head&neck in Dec/2023 negative for stroke. "    History of Intubation: Not intubated this admission    Imaging:  Brain MRI 07/20/22:  "1. No acute intracranial finding.     2. Few nonspecific supratentorial white matter lesions as detailed above  consistent with chronic injury. These may reflect mild chronic small vessel  ischemic changes, or sequela of previous infection/inflammation or  migraines for instance."    CT Head 07/19/22:  "1. No evidence for acute intrathoracic pathology.   2. Dilatation of the ascending thoracic aorta  measuring 4.3 cm.   3. Thickening involving the ascending colon extending to the hepatic   flexure compatible with colitis. "    Chest XR 07/19/22:  "No acute intrathoracic process. "    Medical Diagnosis: Facial droop [R29.810]    Past Medical History:   Diagnosis Date    Hyperlipidemia     Hypertension     Migraine     Morbid obesity     Palpitations     loop recorder  placed in 2019, had to be removed after a month due to misplacement and keloid formation per patient    Seizures     Stroke     2015    Thoracic aortic aneurysm     TIA (transient ischemic attack) 2017     Speech Therapy or Relevant Medical History  Previously received SLP services    Subjective:   Patient is agreeable to participation in the therapy session. Nursing clears patient for therapy. Patient's medical condition is appropriate for speech therapy intervention at this time. No family present at bedside.   PAIN: no     Behavior/cognition:  awake and alert    Objective:   Patient Status:    - Current diet: Regular solids (RG7) and Thin liquids (TN0)   - Position: in bed   - Medical equipment in place: IV    - Respiratory status: room air   - Interpreter services: n/a   - Precautions:  N/A   - Food allergies: Shellfish    Oral Motor Skills and Voice Assessment:  Oral motor exam: right facial asymmetry/droop   Speech: WFL   Vocal Quality: clear vocal quality, judged severity: n/a     Oral Inspection:   Lips: moist/pink  Tongue: moist/pink  Saliva: WFL  Teeth: WFL  Oral care provided: no    PO Trials Presented:  Thin (TN0) via spoon, via cup, and via straw   Puree (PU4)  Regular Solids (RG7)    Oral Phase:  WFL     Pharyngeal Phase/Airway Protection:  present hyolaryngeal movement   no overt signs or symptoms of aspiration     Patient left with call bell within reach, all needs met, fall mat in place and bed alarm activated, and all questions answered. RN notified of session outcome and patient response.   Educated the patient to role of speech therapy, plan of care, goals of therapy and recommendations.     Goals:   N/A      Alla German, M.A. CCC-SLP  07/21/2022  PPE worn by provider: procedural mask and gloves     Time of Treatment:  SLP Received On: 07/21/22  Start Time: 0945  Stop Time: 0955  Time Calculation (min): 10 min

## 2022-07-21 NOTE — Discharge Summary (Signed)
[facility]   Discharge Summary      Patient: [name] Cook  Admission Date: 07/19/2022   DOB: 02-09-1976  Discharge Date: 07/21/2022    MRN: [id number]  Discharge Attending: Kari Baars MD   Referring Physician: Unknown Foley, NP  PCP: Unknown Foley, NP       DISCHARGE SUMMARY     Discharge Information   Admission Diagnosis:   Bell's palsy      Discharge Diagnosis:   Patient Active Problem List    Diagnosis Date Noted    Chest pain, unspecified 07/01/2022    Right sided weakness 09/15/2020    HTN (hypertension) 03/13/2020    Wound dehiscence 10/17/2019    History of seizure disorder 10/17/2019    Cellulitis of other specified site 10/15/2019    Abdominal pain 09/05/2018    Headache 03/05/2018    Nonspecific abnormal electroencephalogram (EEG) 03/04/2018    Right hemiparesis 09/15/2017    Paresthesia of right arm and leg 04/17/2017    Hemorrhoids 04/17/2017    HLD (hyperlipidemia) 04/16/2017    Hematemesis 04/15/2017    Blood loss anemia 04/15/2017    Tension type headache 05/13/2016    History of stroke 05/13/2016    Morbid obesity with BMI of 45.0-49.9, adult 05/13/2016    Facial droop 05/11/2016    Seizures 08/20/2015        Discharge Medications:     Medication List        START taking these medications      alum & mag hydroxide-simethicone 200-200-20 mg/5 mL suspension  Commonly known as: MAALOX PLUS  Take 30 mLs by mouth every 4 (four) hours as needed for Indigestion     famotidine 20 MG tablet  Commonly known as: PEPCID  Take 1 tablet (20 mg) by mouth every 12 (twelve) hours     HYDROmorphone 2 MG tablet  Commonly known as: DILAUDID  Take 1 tablet (2 mg) by mouth every 6 (six) hours as needed for Pain     ondansetron 4 MG disintegrating tablet  Commonly known as: ZOFRAN-ODT  Take 1 tablet (4 mg) by mouth every 8 (eight) hours as needed for Nausea     predniSONE 10 MG tablet  Commonly known as: DELTASONE  Take 6 tablets (60 mg) by mouth daily for 3 days, THEN 5 tablets (50 mg) daily for 1  day, THEN 4 tablets (40 mg) daily for 1 day, THEN 3 tablets (30 mg) daily for 1 day, THEN 2 tablets (20 mg) daily for 1 day, THEN 1 tablet (10 mg) daily for 1 day.  Start taking on: Jul 22, 2022     valACYclovir 1000 MG tablet  Commonly known as: VALTREX  Take 1 tablet (1,000 mg) by mouth 3 (three) times daily            CONTINUE taking these medications      atorvastatin 40 MG tablet  Commonly known as: LIPITOR  Take 1 tablet (40 mg total) by mouth daily     hydrALAZINE 25 MG tablet  Commonly known as: APRESOLINE     hydroCHLOROthiazide 25 MG tablet  Commonly known as: HYDRODIURIL     KEPPRA PO     lisinopril 10 MG tablet  Commonly known as: ZESTRIL               Where to Get Your Medications        These medications were sent to [id number] - [place] - [address]  [address], [place] [address]      Phone: [phone]   famotidine 20 MG tablet  HYDROmorphone 2 MG tablet  ondansetron 4 MG disintegrating tablet  predniSONE 10 MG tablet  valACYclovir 1000 MG tablet             Hospital Course   Presentation History   Per admitting H&P " Kahlee Vanderwal is a 47 y.o. female with h/o HTN, HLD, thoracic aortic aneurysm,  morbid obesity, migraine, palpitations, stroke in 09/2013 per patient (MRI report says no evidence of an acute infarct, it showed a single focal 3 mm area of signal alteration in the cerebral white matter tracts in the left frontal region), seizure disorder (on keppra), and allergy to multiple medications who presented to the emergency room on 07/19/2022 with right facial droop and chest pain for the last 4 days. Symptoms have been persistent. She describes the chest pain as being mid-sternal region 2 days ago, dull, persistent, radiates to bilateral neck and flanks and back. She also states she had double vision, nausea/vomiting, and passed out prior to coming to ED. She denies any headache.      Patient was admitted at [facility] on 07/16/22 for  the above complaints. Her BP was markedly elevated with systolic around 230. CT head and ECHO with bubble study was unremarkable. Neurologist consult was requested,  consult note states "I knew this pt well when she was admitted to [facility] in July and September 2023 for right facial drooping and right side weakness. The brain MRI in July 2023 was negative for stroke. Neuro exam showed her facial drooping was not consistent. Her facial weakness would spontaneously improved when she was distracted during the repeated interviews. The record at [facility] showed that she had frequent ED visit due to similar symptoms or chest pains."     Admission to [facility] in 06/2022 for chest pain and hypertensive urgency. CT chest and then MRA chest done to rule out aortic dissection negative. Prior multiple MRI and MRA chest in Dec/2023 negative. MRI brain and MRA head&neck in Dec/2023 negative for stroke. "     See HPI for details.    Hospital Course   Following admission, patient was placed on aspirin and statins, patient then had MRI of the brain without contrast done which came back negative for acute issues, with that patient was started on was valacyclovir and prednisone p.o. for suspected Bell's palsy, patient has right-sided weakness .      Patient was complaining of having chest pain, cardiac enzymes and EKG was negative, patient has history of ascending aortic aneurysm measuring in Dec 2023 of 4 cm this time had MRA of chest which showed 4.1 cm, could be reader variance, discussed with her about blood pressure control and follow up with PCP for future imaging.     Patient has other medical history including hypertension, seizure disorder, hyperlipidemia, she was given medication as before, at this time patient is cleared for discharge home.      Procedures:   MRA Chest WO Contrast   Final Result        1. No evidence for a thoracic aortic dissection on these limited   noncontrast MRA images.    2. Mild aneurysmal dilatation of the  ascending aorta.      Carolyn Stare, MD   07/20/2022 2:02 PM      MRI Brain WO Contrast   Final Result  1. No acute intracranial finding.      2. Few nonspecific supratentorial white matter lesions as detailed above   consistent with chronic injury. These may reflect mild chronic small vessel   ischemic changes, or sequela of previous infection/inflammation or   migraines for instance.      Gaylyn Rong, MD   07/20/2022 1:47 PM      CT Chest Abdomen Pelvis WO Contrast   Final Result      1. No evidence for acute intrathoracic pathology.   2. Dilatation of the ascending thoracic aorta measuring 4.3 cm.   3. Thickening involving the ascending colon extending to the hepatic   flexure compatible with colitis.      Collene Schlichter, MD   07/19/2022 9:09 PM      CT Head WO Contrast   Final Result       No intracranial hemorrhage or acute intracranial findings.      Brenton Grills, MD   07/19/2022 9:12 PM      XR Chest 2 Views   Final Result      No acute intrathoracic process.      Shelly Flatten, MD   07/19/2022 7:01 PM          Consultants  Treatment Team:   Viviann Spare, MD  Luana Shu, MD  Arma Heading, MD  Ronny Bacon, MD  Viviann Spare, MD  Gittinger, Dessie Coma, SLP  Bucu, Ivy Lynn, RN      Best Practices   Was the patient admitted with either a CHF Exacerbation or Pneumonia? No     Progress Note/Physical Exam at Discharge     Subjective: No complaints    Vitals:    07/20/22 2317 07/21/22 0422 07/21/22 0716 07/21/22 0827   BP: 120/74 121/83 109/68 109/68   Pulse: 82      Resp: 16 16 15     Temp: 97.9 F (36.6 C) 98 F (36.7 C) 97.6 F (36.4 C)    TempSrc: Oral Oral Oral    SpO2: 92% 95% 100%    Weight:       Height:           General: NAD, AAOx3  HEENT: perrla, eomi, sclera anicteric, OP: Clear, MMM  Neck: supple, FROM, no LAD  Cardiovascular: RRR, no m/r/g  Lungs: CTAB, no w/r/r  Abdomen: soft, +BS, NT/ND, no masses, no g/r  Extremities: no  C/C/E  Neuro: No weakness, no sensory loss, cranial nerves are intact  Skin: no rashes or lesions noted    Admission Condition: stable  Discharge Condition: good  Functional Status: Patient is independent with mobility/ambulation, transfers, ADL's, IADL's.     Diagnostics     Labs/Studies Pending at Discharge: No    Last Labs   Recent Labs   Lab 07/19/22  1854   WBC 9.24   RBC 4.70   Hgb 14.4   Hematocrit 44.9*   MCV 95.5   Platelets 158       Recent Labs   Lab 07/21/22  0435 07/20/22  0540 07/19/22  1854   Sodium 137  --  143   Potassium 4.3 3.3* 3.3*   Chloride 107  --  107   CO2 20  --  26   BUN 13.0  --  13.0   Creatinine 1.0  --  0.9   Glucose 202*  --  99   Calcium 9.3  --  9.6  Magnesium  --  2.0  --        Microbiology Results (last 15 days)       ** No results found for the last 360 hours. **             Patient Instructions   Discharge Diet: cardiac diet  Discharge Activity:  activity as tolerated  Discharge instructions:  Discharge Code Status: Full code   Additional instructions - continue medications and complete as prescribed  Disposition:  Home  Follow Up Appointment:   Follow-up Information       Unknown Foley, NP. Schedule an appointment as soon as possible for a visit in 1 week(s).    Specialty: Nurse Practitioner  Contact information:  [address]  [place] [address]  [phone]                             See Unknown Foley, NP in 1-2 weeks.          The review of the patient's medications does not in any way constitute an endorsement, by this clinician,  of their use, dosage, indications, route, efficacy, interactions, or other clinical parameters.     This note was generated within the [employer] EMR using Dragon medical speech recognition software and may contain inherent errors or omissions not intended by the user. Grammatical and punctuation errors, random word insertions, deletions, pronoun errors and incomplete sentences are occasional consequences of this technology due to software  limitations. Not all errors are caught or corrected.  Although every attempt is made to root out erroneus and incomplete transcription, the note may still not fully represent the intent or opinion of the author. If there are questions or concerns about the content of this note or information contained within the body of this dictation they should be addressed directly with the author for clarification.    Time spent examining patient, discussing with patient/family regarding hospital course face to face, chart review, reconciling medications and discharge planning:  total time : 20 minutes.    Carmelina Noun, MD  10:16 AM 07/21/2022     CC to: Unknown Foley, NP

## 2022-07-21 NOTE — Progress Notes (Signed)
Per [facility] rehab liaison Madelin Rear, pt does not qualify for AR placement at this time. Referral placed on 07/21/22 to [facility]. Advised of pt's need for [facility] PT/OT and a walker.    Frederich Chick, LCSW  Case Management and Discharge Planning  [phone]

## 2022-07-21 NOTE — Progress Notes (Addendum)
Arranging for home health services - Discharge address - [place],    insurance - Medicaid - HMO [employer] . BSC and RW will be delivered once approved .    Will update D/C team.     No agency has accepted referral , search has been extended and manually called other agencies but unsuccessful.  Agency response - not in network/ out of network with patient's insurance, out of service area , no staff. .   CM notified .  Per CM , patient did not qualify for AR      Will call patient to inform her to  follow up with her PCP for referral to out patient .

## 2022-07-21 NOTE — Discharge Instr - AVS First Page (Addendum)
Reason for your Hospital Admission:  Bells Palsy      Instructions for after your discharge:  Continue medications as prescribed   Follow with PCP in 1 week time  Please take antiacid while taking prednisone to prevent upset stomach   If your conditions worsen please seek immediate medical attention

## 2022-07-21 NOTE — PT Eval Note (Signed)
Physical Therapy Evaluation  [name] Cook      Post Acute Care Therapy Recommendations:     Discharge Recommendations:  Acute Rehab    If Acute Rehab recommended discharge disposition is not available, patient will need hands on assist for functional mobility and HHPT with supervision, transport into home, and 1st floor set up.     DME needs IF patient is discharging home: Surveyor, quantity, Shower chair    Therapy discharge recommendations may change with patient status.  Please refer to most recent note for up-to-date recommendations.    Patient anticipated to benefit from and to be able to engage in 3 hours of therapy a day for 5 days a week.      Assessment:   Significant Findings: none     [name] Cook is a 47 y.o. female admitted 07/19/2022 with R facial droop and R sided weakness. Patient presents with impaired RLE strength, balance, and coordination impacting safety with ambulation. Presenting with decreased R DF and toe off during gait, unable to correct with cueing. Lateral lean noted without use of AD, improved with FWW use. Pt educated on use of FWW to promote safety and stability with ambulation. Pt would benefit from continued skilled acute PT to improve functional mobility and promote independence.       Therapy Diagnosis: Impaired functional activity, strength, balance, and coordination     Rehabilitation Potential: Good    Treatment Activities: Evaluation, gait   Educated the patient to role of physical therapy, plan of care, goals of therapy and HEP, safety with mobility and ADLs, energy conservation techniques, home safety.    Plan:   Treatment/Interventions: Exercise, Gait training, Stair training, Neuromuscular re-education, Functional transfer training, LE strengthening/ROM, Endurance training, Equipment eval/education, Continued evaluation     PT Frequency: 4-5x/wk   Risks/Benefits/POC Discussed with Pt/Family: With patient     Unit: [facility] TOWER 6  Bed: G956/O130.86      Precautions and Contraindications:   Falls      Consult received for [name] Cook for PT Evaluation and Treatment.  Patient's medical condition is appropriate for Physical Therapy intervention at this time.    History of Present Illness:   [name] Cook is a 47 y.o. female admitted on 07/19/2022 with "... h/o HTN, HLD, thoracic aortic aneurysm,  morbid obesity, migraine, palpitations, stroke in 09/2013 per patient (MRI report says no evidence of an acute infarct, it showed a single focal 3 mm area of signal alteration in the cerebral white matter tracts in the left frontal region), seizure disorder (on keppra), and allergy to multiple medications who presented with right facial droop and chest pain for the last 4 days. Symptoms have been persistent. She describes the chest pain as being mid-sternal region 2 days ago, dull, persistent, radiates to bilateral neck and flanks and back. She also states she had double vision, nausea/vomiting, and passed out prior to coming to ED. She denies any headache." Per H&P      Medical Diagnosis: Facial droop [R29.810]    Past Medical/Surgical History:   Past Medical History:   Diagnosis Date    Hyperlipidemia     Hypertension     Migraine     Morbid obesity     Palpitations     loop recorder placed in 2019, had to be removed after a month due to misplacement and keloid formation per patient    Seizures     Stroke     2015  Thoracic aortic aneurysm     TIA (transient ischemic attack) 2017     Past Surgical History:   Procedure Laterality Date    APPENDECTOMY (OPEN)      CHOLECYSTECTOMY      EGD, BIOPSY N/A 04/16/2017    Procedure: EGD, BIOPSY;  Surgeon: Pershing Proud, MD;  Location: [facility];  Service: Gastroenterology;  Laterality: N/A;    HYSTERECTOMY         Imaging/Tests/Labs:   MRA Chest WO Contrast    Result Date: 07/20/2022    1. No evidence for a thoracic aortic dissection on these limited noncontrast MRA images. 2. Mild aneurysmal dilatation of the ascending  aorta. Carolyn Stare, MD 07/20/2022 2:02 PM    MRI Brain WO Contrast    Result Date: 07/20/2022  1. No acute intracranial finding. 2. Few nonspecific supratentorial white matter lesions as detailed above consistent with chronic injury. These may reflect mild chronic small vessel ischemic changes, or sequela of previous infection/inflammation or migraines for instance. Gaylyn Rong, MD 07/20/2022 1:47 PM    CT Head WO Contrast    Result Date: 07/19/2022   No intracranial hemorrhage or acute intracranial findings. Brenton Grills, MD 07/19/2022 9:12 PM    CT Chest Abdomen Pelvis WO Contrast    Result Date: 07/19/2022  1. No evidence for acute intrathoracic pathology. 2. Dilatation of the ascending thoracic aorta measuring 4.3 cm. 3. Thickening involving the ascending colon extending to the hepatic flexure compatible with colitis. Collene Schlichter, MD 07/19/2022 9:09 PM    XR Chest 2 Views    Result Date: 07/19/2022  No acute intrathoracic process. Shelly Flatten, MD 07/19/2022 7:01 PM    XR Chest AP Portable    Result Date: 07/17/2022  IMPRESSION: Hypoinflation. Bibasilar platelike opacities most likely represent atelectasis. Thank you for this referral to [facility] [employer] in partnership with [facility].   For further consultation, please call [phone] or [phone] (phones answered 24/7/365).  Electronically Signed by Orbie Hurst, MD 07/17/2022 11:36 PM    CT Head WO Contrast    Result Date: 07/15/2022  IMPRESSION: No acute intracranial findings or interval change..   Thank you for this referral to [facility] [place] [employer] in partnership with [facility].   For further consultation, please call [phone] or [phone] (phones answered 24/7/365).  Electronically Signed by Demetrio Lapping 07/15/2022 4:32 PM    MRA Chest WO Contrast    Result Date: 07/01/2022    1. Limited study secondary to an incomplete examination as the patient refused to complete the  full study. 2. Within this limitation, there is no evidence of dissection of the thoracic aorta. Dilated mid ascending aorta to 4.1 cm, stable from 06/30/2022 Carlynn Spry, MD 07/01/2022 4:06 AM    CT Chest without Contrast    Result Date: 06/30/2022  1. Limited unenhanced study with no acute abnormality. 2. Unchanged basilar lung scarring. Nelta Numbers, MD 06/30/2022 10:30 PM    XR Chest 2 Views    Result Date: 06/30/2022  1. No acute cardiopulmonary processes. Kristine Linea, MD 06/30/2022 9:30 PM      Lab Results   Component Value Date/Time    HGB 14.4 07/19/2022 06:54 PM    HCT 44.9 (H) 07/19/2022 06:54 PM    K 4.3 07/21/2022 04:35 AM    NA 137 07/21/2022 04:35 AM    INR 1.1 03/14/2021 05:52 AM    TROPI <2.7 07/19/2022 06:54 PM  Social History:   Prior Level of Function:  Prior level of function: Independent with ADLs, Ambulates independently  Baseline Activity Level: Community ambulation  Ambulated 100 feet or more prior to admission: Yes  Driving: independent  DME Currently at Home:  (none)    Home Living Arrangements:  Living Arrangements: Spouse/significant other, Children  Type of Home: House  Home Layout: Multi-level  Bathroom Shower/Tub: Medical sales representative: Standard  DME Currently at Home:  (none)  Home Living - Notes / Comments: 30 STE, 25 stairs to bedroom.    Subjective:   "I have a 24 month old granddaughter"  Patient is agreeable to participation in the therapy session. Nursing clears patient for therapy.     Patient Goal: to be able to walk better     Pain: not stated     Objective:   Patient is in bed with telemetry and peripheral IV access in place.  Pt wore mask during therapy session:No      Cognitive Status and Neuro Exam:  Cognition/Neuro Status  Arousal/Alertness: Appropriate responses to stimuli  Attention Span: Appears intact  Orientation Level: Oriented X4  Memory: Appears intact  Following Commands: Follows all commands and directions without difficulty  Safety Awareness:  minimal verbal instruction  Insights: Educated in Engineer, building services          Musculoskeletal Examination  RUE ROM: WFL  LUE ROM: WFL  RLE ROM: WFL  LLE ROM: WFL     RUE Strength: 4+/5  LUE Strength: 5/5  RLE Strength:    Hip Flexion 4-/5   Knee Extension 4-/5   Ankle DF 4-/5  LLE Strength:    Hip Flexion 5/5   Knee Extension 5/5   Ankle DF 5/5    Sensation   BLE light touch: intact, pt reports tingling to plantar surface of R foot     Functional Mobility  Rolling: SBA   Supine to Sit: SBA   Scooting: SBA   Sit to Stand: CGA   Stand to Sit: CGA   Transfers: CGA     Ambulation  PMP - Progressive Mobility Protocol   PMP Activity: Step 6 - Walks in Room  Distance Walked (ft) (Step 6,7): 50 Feet   Level of assistance required: CGA   Pattern: shuffled gait, decr'd step length, decr'd cadence, decr'd push off on R  Device Used: FWW   Weightbearing Status: no restrictions   Stair Management: pt deferred       Balance  Static Sitting: good  Dynamic Sitting: good  Static Standing: good  Dynamic Standing: fair       Participation and Activity Tolerance  Participation Effort: Good  Endurance: Good    AM-PACT Inpatient Short Forms  Inpatient AM-PACT Performed? (PT): Basic Mobility Inpatient Short Form  AM-PACT "6 Clicks" Basic Mobility Inpatient Short Form  Turning Over in Bed: None  Sitting Down On/Standing From Armchair: None  Lying on Back to Sitting on Side of Bed: None  Assist Moving to/from Bed to Chair: A little  Assist to Walk in Hospital Room: A little  Assist to Climb 3-5 Steps with Railing: A lot  PT Basic Mobility Raw Score: 20  CMS 0-100% Score: 35.83%      Patient left with call bell within reach, all needs met, SCDs n/a as found, fall mat in place, bed alarm on, chair alarm n/a and all questions answered. RN notified of session outcome and patient response.     Goals:  Goals  Goal  Formulation: With patient  Time for Goal Acheivement: 5 visits  Goals: Select goal  Pt Will Perform Sit to Stand: with supervision, to  maximize functional mobility and independence, 5 visits  Pt Will Ambulate: > 200 feet, with supervision, to maximize functional mobility and independence, 5 visits (with least restrictive device)  Pt Will Go Up / Down Stairs: 1 flight, with stand by assist, to maximize functional mobility and independence, 5 visits      PPE worn during session: procedural mask and gloves    Tech present: none   PPE worn by tech: N/A      Time of Treatment  PT Received On: 07/21/22  Start Time: 0930  Stop Time: 0955  Time Calculation (min): 25 min      Jeanne Ivan, PT, DPT  Pager # [phone]

## 2022-07-21 NOTE — OT Eval Note (Signed)
Occupational Therapy Eval [name]        Post Acute Care Therapy Recommendations:     Discharge Recommendations:  Acute Rehab (may progress to home)      If Acute Rehab  recommended discharge disposition is not available, patient will need Hands on assist for adls,functional mobility HHOT and HHA.     DME needs IF patient is discharging home: Front wheel walker, BSC, Shower chair    Therapy discharge recommendations may change with patient status.  Please refer to most recent note for up-to-date recommendations.    Assessment:   Significant Findings: None    [name] Cook is a 47 y.o. female admitted 07/19/2022.  Patient presents with Right-sided facial weakness. Patient needed assistance w/bed mobility, functional ambulation to bathroom and LB dressing. Patient has 26 steps to enter and 3 more flight inside to get access to her room. She lives with spouse who is out all day for work. Currently pt presents with unsteady gait, numbness R foot, decreased adl I and unsafe to go home alone. Patient motivated to participate and may benefit from AR to decrease burden of care. Pt is performing below functional baseline and will benefit from continued OT to maximize independence and safety with ADLs and functional transfers/mobility.         Impairments: Assessment: decreased independence with ADLs    Therapy Diagnosis: decreased adl I    Rehabilitation Potential: Prognosis: Good;With continued OT s/p acute discharge     Treatment Activities: OT Eval, adl re training, functional mobility    Educated the patient to role of occupational therapy, plan of care, goals of therapy and HEP.    Plan:   OT Frequency Recommended: 4-5x/wk     Treatment Interventions: ADL retraining;Functional transfer training;Patient/Family training;Equipment eval/education     Risks/benefits/POC discussed w/patient      Unit: [facility] TOWER 6  Bed: X528/U132.44         Precautions and Contraindications:   Precautions  Weight  Bearing Status: no restrictions  Precaution Instructions Given to Patient: Yes  Other Precautions: falls      Consult received for [name] for OT Evaluation and Treatment.  Patient's medical condition is appropriate for Occupational Therapy intervention at this time.    Admitting Diagnosis: Facial droop [R29.810]      History of Present Illness:    [name] Cook is a 47 y.o. female admitted on 07/19/2022 with h/o HTN, HLD, thoracic aortic aneurysm,  morbid obesity, migraine, palpitations, stroke in 09/2013 per patient (MRI report says no evidence of an acute infarct, it showed a single focal 3 mm area of signal alteration in the cerebral white matter tracts in the left frontal region), seizure disorder (on keppra), and allergy to multiple medications who presented to the emergency room on 07/19/2022 with right facial droop and chest pain for the last 4 days. Symptoms have been persistent. She describes the chest pain as being mid-sternal region 2 days ago, dull, persistent, radiates to bilateral neck and flanks and back. She also states she had double vision, nausea/vomiting, and passed out prior to coming to ED. She denies any headache.      Patient was admitted at [facility] on 07/16/22 for the above complaints. Her BP was markedly elevated with systolic around 230. CT head and ECHO with bubble study was unremarkable. Neurologist consult was requested,  consult note states "I knew this pt well when she was admitted to [facility] in July and September  2023 for right facial drooping and right side weakness. The brain MRI in July 2023 was negative for stroke. Neuro exam showed her facial drooping was not consistent. Her facial weakness would spontaneously improved when she was distracted during the repeated interviews. The record at [facility] showed that she had frequent ED visit due to similar symptoms or chest pains."     Admission to [facility] in 06/2022 for chest pain and hypertensive urgency. Right-sided  facial weakness       Past Medical/Surgical History:  Past Medical History:   Diagnosis Date    Hyperlipidemia     Hypertension     Migraine     Morbid obesity     Palpitations     loop recorder placed in 2019, had to be removed after a month due to misplacement and keloid formation per patient    Seizures     Stroke     2015    Thoracic aortic aneurysm     TIA (transient ischemic attack) 2017      Past Surgical History:   Procedure Laterality Date    APPENDECTOMY (OPEN)      CHOLECYSTECTOMY      EGD, BIOPSY N/A 04/16/2017    Procedure: EGD, BIOPSY;  Surgeon: Pershing Proud, MD;  Location: [facility];  Service: Gastroenterology;  Laterality: N/A;    HYSTERECTOMY             Imaging/Tests/Labs:  MRA Chest WO Contrast    Result Date: 07/20/2022    1. No evidence for a thoracic aortic dissection on these limited noncontrast MRA images. 2. Mild aneurysmal dilatation of the ascending aorta. Carolyn Stare, MD 07/20/2022 2:02 PM    MRI Brain WO Contrast    Result Date: 07/20/2022  1. No acute intracranial finding. 2. Few nonspecific supratentorial white matter lesions as detailed above consistent with chronic injury. These may reflect mild chronic small vessel ischemic changes, or sequela of previous infection/inflammation or migraines for instance. Gaylyn Rong, MD 07/20/2022 1:47 PM      Social History:   Prior Level of Function:  Prior level of function: Independent with ADLs, Ambulates independently  Baseline Activity Level: Community ambulation  Ambulated 100 feet or more prior to admission: Yes  DME Currently at Home:  (none)    Home Living Arrangements:  Living Arrangements: Spouse/significant other, Children  Type of Home: House  Home Layout: Multi-level, Stairs to enter with rails (add number in comment) (26)  Bathroom Shower/Tub: Tub/shower unit  Bathroom Toilet: Standard  DME Currently at Home:  (none)      Subjective:I have a lot of stairs     Patient is agreeable to participation in the therapy session.        Patient Goal:  home    Pain:4/10  R side -chest  Pre medicated via nsg         Objective:        Observation of Patient/Vital Signs:  Patient is in bed with dressings, SCD's, and peripheral IV in place.  Pt wore mask during therapy session:No      Cognitive Status and Neuro Exam:   Alert O x4   Able to follow all commands  Minimally impaired co-ordination            Musculoskeletal Examination  Gross ROM  Right Upper Extremity ROM: within functional limits  Left Upper Extremity ROM: within functional limits    Gross Strength  Right Upper Extremity Strength: within functional limits  Left  Upper Extremity Strength: within functional limits              Sensory/Oculomotor Examination   Touch-numbness R foot-toes  Vision- appears intact         Activities of Daily Living  Self-care and Home Management  Eating: Setup  Grooming: Minimal Assist  Bathing: Moderate Assist  UB Dressing: Stand by Assist  LB Dressing: Minimal Assist  Toileting: Minimal Assist  Functional Transfers: Minimal Assist    Functional Mobility:  Mobility and Transfers  Supine to Sit: Minimal Assist  Sit to Supine: Minimal Assist  Sit to Stand: Minimal Assist  Functional Mobility/Ambulation: Minimal Assist     PMP Activity: Step 6 - Walks in Room     Balance  Balance  Static Sitting Balance: good  Dyanamic Sitting Balance: fair    Participation and Activity Tolerance  Participation and Endurance  Participation Effort: good  Endurance: Tolerates 10 - 20 min exercise with multiple rests    Patient left with call bell within reach, all needs met, SCDs off, fall mat on, bed alarm on, chair alarm off and all questions answered. RN notified of session outcome and patient response.       Goals:  Time For Goal Achievement: 5 visits  ADL Goals  Patient will dress lower body: Modified Independent  Patient will toilet: Modified Independent  Mobility and Transfer Goals  Pt will perform functional transfers: Modified Independent                           PPE  worn during session: procedural mask and gloves    Tech present: n/a  PPE worn by tech: N/A           Time of Treatment:   OT Received On: 07/21/22  Start Time: 0820  Stop Time: 0900  Time Calculation (min): 40 min         Tora Perches    OTR/L,CSRS  Pager [phone]

## 2022-07-21 NOTE — Progress Notes (Signed)
Discharge Instructions:                   Patient stable for discharge. Discharge instructions given, patient teachings done and verbalized understanding. All questions answered at this time. Aware to follow-up and schedule appointments as necessary. IV access and cardiac monitor discontinued. Discharge to home ambulatory per patient's request. Accompanied by husband.     No home medication returned to patient from  pharmacy.     Patient aware to follow-up with MD and PMD as needed.

## 2022-07-21 NOTE — Progress Notes (Signed)
Start [facility] Note  Home Health Referral    Referral from Dorothea Ogle , [place]  (Case Manager) for home health care upon discharge.    By [employer], the patient has the right to freely choose a home care provider.    A company of the patients choosing. We have supplied the patient with a listing of providers in your area who asked to be included and participate in Medicare.   [facility], formerly [facility], a home care agency that provides adult home care services and participates in Medicare   The preferred provider of your insurance company. Choosing a home care provider other than your insurance company's preferred provider may affect your insurance coverage.      Home Health Discharge Information    Your doctor has ordered Skilled Nursing, Physical Therapy, and Occupational Therapy in-home service(s) for you while you recuperate at home, to assist you in the transition from hospital to home.    The agency that you or your representative chose to provide the service:  Name of Home Health Agency Placement: Other (comment box)]  Phone:     [employer]:  Name of DME Agency: Other (comment)]  Phone:   Equipment Ordered: Bedside Commode and a rolling walker        The above services were set up by:  Guy Begin, RN (Home Health Liaison)   Phone:  [phone]    IF YOU HAVE NOT HEARD FROM YOUR HOME YOUR HOME HEALTH AGENCY WITHIN 24-48 HOURS AFTER DISCHARGE PLEASE CALL YOUR AGENCY TO ARRANGE A TIME FOR YOUR FIRST VISIT. FOR ANY SCHEDULING CONCERNS OR QUESTIONS RELATED TO HOME HEALTH, SUCH AS TIME OR DATE PLEASE CONTACT YOUR HOME HEALTH AGENCY AT THE NUMBER LISTED ABOVE.    Additional comments:  Patient in agreement to home with home health services, no preferrence as to home health agency provided in network with insurance and location . Requested DMEs - RW and BSC       START PATIENT REGISTRATION INFORMATION     Order Information  Order Signing Physician: Viviann Spare, MD    Service Ordered RN ?: Yes  Service Ordered PT ?: Yes  Service Ordered OT ?: Yes  Service Ordered ST ?: No    Service Ordered MSW?: No    Service Ordered HHA?: No    Following Physician: Unknown Foley, NP  Following Physician Phone: [phone]  Overseeing Physician: N/A  (Required for Residents only)   Agreeable to Follow?: Yes  Spoke with: N/A  Date/Time of Call: 07/21/22 3:46 PM      Care Coordination   Same Day [facility]?: no  Primary Care Physician:Debra Apperson, NP  Primary Care Physician [phone]  Primary Care Physician Address: [address] / [place] [address]  PCP NPI: [id number]  Visit Instructions: N/A  Service Discharge Location Type: Home  Service Facility Name: N/A  Service Floor Facility: N/A  Service Room No: N/A    Demographics  Patient Last Name: [name]   Patient First Name: [name] Cook  Language/Communication Barrier: No  Service Address: [address] [place] [address]   Service Home Phone: [phone] (home)   Other phone numbers:    Telephone Information:   Mobile [phone]     Emergency Contact: Extended Emergency Contact Information  Primary Emergency Contact: Harper,Patrick   [place] Phone: [phone]  Relation: Significant Other    Admission Information  Admit Date:  07/19/2022  Patient Status at discharge: Inpatient  Admitting Diagnosis: Facial droop [R29.810]     Caregiver Information  Caregiver First Name: Luisa Hart   Caregiver Last Name: Clearance Coots   Caregiver Relationship to Patient: Spouse  Caregiver Phone Number:  [phone]  Caregiver Notes: N/A            Data processing manager Information  Primary Subscriber:   Primary Subscriber Relation To Guarantor:   Primary Payor:   Primary Plan:   Primary Group #:    Primary Subscriber ID:    Estate agent DOB:   Secondary Insurance Information  Secondary Subscriber:   Secondary Subscriber Relation To Guarantor:   Secondary Payor:   Secondary Plan:   Secondary Group #:    Secondary Subscriber ID:   Secondary Subscriber DOB:   HITECH  NO    END PATIENT REGISTRATION INFORMATION       Diagnosis:  Facial droop [R29.810]    Start [facility] Summary        Additional Comments:      End [facility] Summary     Discharge Date:   07/21/2022    Referral Source  Signed by: Guy Begin, RN  Date Time: 07/21/22 3:46 PM      End [facility] Note         Home Health face-to-face (FTF) Encounter (Order [id number])  Consult  Date: 07/21/2022 Department: [facility] 6 Ordering/Authorizing: Viviann Spare, MD     Order Information    Order Date/Time Release Date/Time Start Date/Time End Date/Time   07/21/22 03:40 PM None 07/21/22 03:39 PM 07/21/22 03:39 PM     Order Details    Frequency Duration Priority Order Class   Once 1  occurrence Routine Hospital Performed     Standing Order Information    Remaining Occurrences Interval Last Released     0/1 Once 07/21/2022              Provider Information    Ordering User Ordering Provider Authorizing Provider   Guy Begin, RN Ashiny, Towanda Octave, MD Viviann Spare, MD   Attending Provider(s) Admitting Provider PCP   Elesa Hacker, MD; Leeroy Bock, MD; Viviann Spare, MD Tefered[place], Ardath Sax, MD Unknown Foley, NP     Verbal Order Info    Action Created on Order Mode Entered by Responsible Provider Signed by Signed on   Ordering 07/21/22 1540 Telephone with readback Guy Begin, RN Ashiny, Towanda Octave, MD Viviann Spare, MD 07/21/22 1546           Comments    Facial droop R29.810   Morbid obesity with BMI of 45.0-49.9, adult E66.01, Z68.42   HLD (hyperlipidemia) E78.5   Right hemiparesis G81.91   History of seizure disorder Z86.69   HTN (hypertension) I10   Chest pain, unspecified R07.9       Home nursing required for skilled assessment including cardiopulmonary assessment and dietary education for disease management, and medication instruction. Home PT/OT required for gait and balance training, strengthening,  mobility, fall prevention, and ADL training.                Home Health face-to-face (FTF) Encounter: Patient Communication     Not Released  Not seen         Order Questions    Question Answer   Date I saw the patient face-to-face: 07/21/2022   Medical conditions that necessitate Home Health care: B.  Functional impairment due to recent hospitalization/procedure/treatment    C.  Risk for complication/infection/pain requiring follow up and monitoring    D.  Chronic illness & risk for re-hospitalization due to unstable disease status    E.  Exacerbation of disease requiring follow up monitoring   Clinical findings that support the need for Skilled Nursing. SN will: C. Monitor for signs and symptoms of exacerbation of disease and management    D. Review medication reconciliation, manage and educate on use and side effects    H. Assess cardiopulmonary status and monitor for signs &symptoms of exacerbation    I.  Educate dietary and or fluid restrictions and weight management   Clinical findings that support the need for Physical Therapy. PT will A.  Evaluate and treat functional impairment and improve mobility    C.  Educate on weight bearing status, stair/gait training, balance & coordination    D.  Provide services to help restore function, mobility, and releive pain    E.  Educate on functional mobility; bed, chair, sit, stand and transfer activities    F.  Perform home safety assessment & develop safe in home exercise program    G.  Implement activities to improve stance time, cadence & step length    I.  Instruct on restorative activities to restore ability to perform ADL   Clinical findings support the need for OT (needs SN/PT order).OT will A.  Develop in home program to improve ability to perform ADLs    B.  Develop restorative program to improve mobility and independence   Clinical findings that support the need for SLP. ST will F.  N/A   Per clinical findings, following services are medically necessary: PT     Skilled Nursing    OT   Evidence this patient is homebound because: C.  Decreased endurance, strength, ROM, cadence, safety/judgment during mobility    G.  Fall risk due to impaired coordination, gait and decreased balance                    Process Instructions    Please select Home Care Services medically necessary.     Based on the above findings, I certify that this patient is confined to the home and needs intermittent skilled nursing care, physical therapry and / or speech therapy or continues to need occupational therapy. The patient is under my care, and I have initiated the establishment of the plan of care. This patient will be followed by a physician who will periodically review the plan of care.      Collection Information            Consult Order Info    ID Description Priority Start Date Start Time   [id number] Home Health face-to-face (FTF) Encounter Routine 07/21/2022  3:39 PM   Provider Specialty Referred to   ______________________________________ _____________________________________                         Verbal Order Info    Action Created on Order Mode Entered by Responsible Provider Signed by Signed on   Ordering 07/21/22 1540 Telephone with readback Guy Begin, RN Viviann Spare, MD Viviann Spare, MD 07/21/22 1546           Patient Information    Patient Name   [name] Legal Sex   Female DOB   1975-05-16       Reprint Order Requisition  Home Health face-to-face (FTF) Encounter (Order #[id number]) on 07/21/22       Additional Information    Associated Reports External References   Priority and Order Details [employer]

## 2022-07-21 NOTE — Plan of Care (Signed)
Problem: Pain interferes with ability to perform ADL  Goal: Pain at adequate level as identified by patient  Outcome: Progressing     Problem: Side Effects from Pain Analgesia  Goal: Patient will experience minimal side effects of analgesic therapy  Outcome: Progressing     Problem: Moderate/High Fall Risk Score >5  Goal: Patient will remain free of falls  Outcome: Progressing     Problem: Day of Admission - Stroke  Goal: Core/Quality measure requirements - Admission  Outcome: Progressing     Problem: Every Day - Stroke  Goal: Neurological status is stable or improving  Outcome: Progressing  Goal: Mobility/Activity is maintained at optimal level for patient  Outcome: Progressing  Goal: Patient will maintain adequate oxygenation  Outcome: Progressing     Problem: Neurological Deficit  Goal: Neurological status is stable or improving  Outcome: Progressing     Problem: Potential for Aspiration  Goal: Risk of aspiration will be minimized  Outcome: Progressing     Problem: Peripheral Neurovascular Impairment  Goal: Extremity color, movement, sensation are maintained or improved  Outcome: Progressing     Problem: Impaired Mobility  Goal: Mobility/Activity is maintained at optimal level for patient  Outcome: Progressing

## 2022-07-24 NOTE — Progress Notes (Addendum)
[facility]  (P): [phone]   (F): [phone]    Note: DME order has been fax'd to [employer] to process and drop ship fww and BSC to patient's home    Per [employer] patient is not eligible to receive a FWW, she received one 09/16/2020. BSC will still be sent to patient's home

## 2022-07-24 NOTE — Progress Notes (Signed)
Ht: 1.778 m (5\' 10" )   Wt: 140.6 kg (310 lb)   Admission Wt: 140.6 kg (310 lb)            [facility]        Patient Name: [name] Cook     MRN: [id number]      CSN: [id number]         Account Information     Hosp Acct #   [id number] Patient Class   Observation Service  Neurology Accommodation Code  Telemetry      Admission Information     Admitting Physician:  Attending Physician: Leeroy Bock, MD    Unit  [facility] TOWER 6 E* L&D Status      Admitting Diagnosis: Facial droop Room / Bed  W295/A213.08 L&D - Last Menstrual Cycle  10/15/2018   Chief Complaint: Chest Pain; Facial Droop     Admit Type:  Admit Date/Time:  Discharge Date/Time: Emergency  07/19/2022 / 6578  07/21/2022 / 1557 Length of Stay: 0 Days    L&D EDD   Estimated Date of Delivery: None noted.      Patient Information              Home Address: [address] [place] [address] Employer:  Employer Address:       ,     Main Phone: [phone] Employer Phone:     SSN: [id number]       DOB: 11-26-1975 (47 yrs)       Sex: Female Primary Care Physician: [name] Foley, NP   Marital Status: Married Referring Physician:       No ref. provider found   Race: Black or African American       Ethnicity: Not of Hispanic/Latino/S*       Emergency Contacts  Name Home Phone Work Phone Mobile Phone Relationship [name] Cook     [phone] Significant*           Guarantor Information     Guarantor Name: [name] Cook ID: [id number]   Guarantor Relationship to Pt: Self Guarantor Type: Personal/Family   Guarantor DOB:    [birth year] Billing Indicator:        Guarantor Address: [address] [place] [address]          Guarantor Home Phone: [phone] Guarantor Employer:        Guarantor Work Phone:   Pensions consultant Emp Phone:                     Editor, commissioning     Insurance Name: MEDICAID [employer]* Subscriber Name: [name] Cook Address:    [address] [address]  [place], [place] [address]  Subscriber DOB: June 23, 1975      Subscriber ID: [id number]   Insurance Phone: [phone] Pt Relationship to Sub:   Self   Insurance ID:         Group Name:   Preauthorization #: NPR   Group #:   Preauthorization Days:        Tourist information centre manager Name: Scientist, clinical (histocompatibility and immunogenetics) Name:     Community education officer Address:       ,   Statistician DOB:        Subscriber ID:     Press photographer:   Pt Relationship to Sub:       Insurance ID:         Group Name:   Preauthorization #:  Group #:   Preauthorization Days:        [employer] Name: - Subscriber Name:     Nurse, children's:       ,   Statistician DOB:        Subscriber ID:     Press photographer:   Pt Relationship to Sub:       Insurance ID:         Group Name:   Preauthorization #:     Group #:   Preauthorization Days:           07/24/2022 12:56 PM        Home Health face-to-face (FTF) Encounter (Order [id number])  Consult  Date: 07/21/2022 Department: [facility] Ordering/Authorizing: [name] Spare, MD     Order Information    Order Date/Time Release Date/Time Start Date/Time End Date/Time   07/21/22 04:12 PM None 07/21/22 04:12 PM 07/21/22 04:12 PM     Order Details    Frequency Duration Priority Order Class   Once 1  occurrence Routine Hospital Performed     Standing Order Information    Remaining Occurrences Interval Last Released     0/1 Once 07/21/2022              Provider Information    Ordering User Ordering Provider Authorizing Provider   [name] Begin, RN Ashiny, Towanda Octave, MD [name] Spare, MD   Attending Provider(s) Admitting Provider PCP   [name] Hacker, MD; Leeroy Bock, MD; [name] Spare, MD Teferedgin, Ardath Sax, MD [name] Foley, NP     Verbal Order Info    Action Created on Order Mode Entered by Responsible Provider Signed by Signed on   Ordering 07/21/22 1612 Telephone with readback [name] Begin, RN Ashiny, Towanda Octave, MD [name] Spare, MD 07/21/22 1616            Comments    Ht: 1.778 m (5\' 10" )  Wt: 140.6 kg (310 lb)  Admission Wt: 140.6 kg (310 lb)    Facial droop R29.810  Morbid obesity with BMI of 45.0-49.9, adult E66.01, Z68.42  HLD (hyperlipidemia) E78.5  Right hemiparesis G81.91  History of seizure disorder Z86.69  HTN (hypertension) I10  Chest pain, unspecified R07.9    Bariatric Rolling walker needed >99 months, NPI: _[id number]______  Bariatric  Bedside commode  needed >99 months, NPI: __[id number]_____                Home Health face-to-face (FTF) Encounter: Patient Communication     Not Released  Not seen         Order Questions    Question Answer   Date I saw the patient face-to-face: 07/21/2022   Medical conditions that necessitate Home Health care: M.  N/A DME only. No skilled services needed   Clinical findings that support the need for Skilled Nursing. SN will: O. N/A   Clinical findings that support the need for Physical Therapy. PT will K.  N/A   Clinical findings support the need for OT (needs SN/PT order).OT will F.  N/A   Clinical findings that support the need for SLP. ST will F.  N/A   Per clinical findings, following services are medically necessary: N/A   Evidence this patient is homebound because: O.  N/A DME only   DME Rolling Walker    Bedside Commode  Process Instructions    Please select Home Care Services medically necessary.    Based on the above findings, I certify that this patient is confined to the home and needs intermittent skilled nursing care, physical therapry and / or speech therapy or continues to need occupational therapy. The patient is under my care, and I have initiated the establishment of the plan of care. This patient will be followed by a physician who will periodically review the plan of care.     Collection Information            Consult Order Info    ID Description Priority Start Date Start Time   [id number] Home Health face-to-face (FTF) Encounter Routine 07/21/2022  4:12 PM   Provider  Specialty Referred to   ______________________________________ _____________________________________                         Verbal Order Info    Action Created on Order Mode Entered by Responsible Provider Signed by Signed on   Ordering 07/21/22 1612 Telephone with readback [name] Begin, RN [name] Spare, MD [name] Spare, MD 07/21/22 1616           Patient Information    Patient Name  [name] Cook Legal Sex  Female DOB  1975-05-01       Reprint Order Requisition    Home Health face-to-face (FTF) Encounter (Order #[id number]) on 07/21/22       Additional Information    Associated Reports External References   Priority and Order Details [facility]           [name] Spare, MD   Physician  Internal Medicine     Discharge Summary      Signed     Date of Service: 07/21/2022 10:16 AM  Creation Time: 07/21/2022 10:16 AM     Signed       Expand All Collapse All    [facility]   Discharge Summary      Patient: [name] Cook  Admission Date: 07/19/2022   DOB: January 28, 1976  Discharge Date: 07/21/2022    MRN: [id number]  Discharge Attending: Kari Baars MD   Referring Physician: Unknown Foley, NP  PCP: [name] Foley, NP       DISCHARGE SUMMARY      Discharge Information   Admission Diagnosis:   Bell's palsy       Discharge Diagnosis:        Patient Active Problem List     Diagnosis Date Noted    Chest pain, unspecified 07/01/2022    Right sided weakness 09/15/2020    HTN (hypertension) 03/13/2020    Wound dehiscence 10/17/2019    History of seizure disorder 10/17/2019    Cellulitis of other specified site 10/15/2019    Abdominal pain 09/05/2018    Headache 03/05/2018    Nonspecific abnormal electroencephalogram (EEG) 03/04/2018    Right hemiparesis 09/15/2017    Paresthesia of right arm and leg 04/17/2017    Hemorrhoids 04/17/2017    HLD (hyperlipidemia) 04/16/2017    Hematemesis 04/15/2017    Blood loss anemia 04/15/2017    Tension type headache 05/13/2016    History of stroke 05/13/2016     Morbid obesity with BMI of 45.0-49.9, adult 05/13/2016    Facial droop 05/11/2016    Seizures 08/20/2015         Discharge Medications:      Medication List  START taking these medications       alum & mag hydroxide-simethicone 200-200-20 mg/5 mL suspension  Commonly known as: MAALOX PLUS  Take 30 mLs by mouth every 4 (four) hours as needed for Indigestion      famotidine 20 MG tablet  Commonly known as: PEPCID  Take 1 tablet (20 mg) by mouth every 12 (twelve) hours      HYDROmorphone 2 MG tablet  Commonly known as: DILAUDID  Take 1 tablet (2 mg) by mouth every 6 (six) hours as needed for Pain      ondansetron 4 MG disintegrating tablet  Commonly known as: ZOFRAN-ODT  Take 1 tablet (4 mg) by mouth every 8 (eight) hours as needed for Nausea      predniSONE 10 MG tablet  Commonly known as: DELTASONE  Take 6 tablets (60 mg) by mouth daily for 3 days, THEN 5 tablets (50 mg) daily for 1 day, THEN 4 tablets (40 mg) daily for 1 day, THEN 3 tablets (30 mg) daily for 1 day, THEN 2 tablets (20 mg) daily for 1 day, THEN 1 tablet (10 mg) daily for 1 day.  Start taking on: Jul 22, 2022      valACYclovir 1000 MG tablet  Commonly known as: VALTREX  Take 1 tablet (1,000 mg) by mouth 3 (three) times daily                CONTINUE taking these medications       atorvastatin 40 MG tablet  Commonly known as: LIPITOR  Take 1 tablet (40 mg total) by mouth daily      hydrALAZINE 25 MG tablet  Commonly known as: APRESOLINE      hydroCHLOROthiazide 25 MG tablet  Commonly known as: HYDRODIURIL      KEPPRA PO      lisinopril 10 MG tablet  Commonly known as: ZESTRIL                    Where to Get Your Medications          These medications were sent to [id number] - [place], [place] - [address]  [address], [place] [address]        Phone: [phone]   famotidine 20 MG tablet  HYDROmorphone 2 MG tablet  ondansetron 4 MG disintegrating tablet  predniSONE 10 MG tablet  valACYclovir 1000 MG tablet                 Hospital Course   Presentation History   Per admitting H&P " Pang Godinho is a 47 y.o. female with h/o HTN, HLD, thoracic aortic aneurysm,  morbid obesity, migraine, palpitations, stroke in 09/2013 per patient (MRI report says no evidence of an acute infarct, it showed a single focal 3 mm area of signal alteration in the cerebral white matter tracts in the left frontal region), seizure disorder (on keppra), and allergy to multiple medications who presented to the emergency room on 07/19/2022 with right facial droop and chest pain for the last 4 days. Symptoms have been persistent. She describes the chest pain as being mid-sternal region 2 days ago, dull, persistent, radiates to bilateral neck and flanks and back. She also states she had double vision, nausea/vomiting, and passed out prior to coming to ED. She denies any headache.      Patient was admitted at [facility] on 07/16/22 for the above complaints. Her BP was markedly elevated with systolic around 230.  CT head and ECHO with bubble study was unremarkable. Neurologist consult was requested,  consult note states "I knew this pt well when she was admitted to [facility] in July and September 2023 for right facial drooping and right side weakness. The brain MRI in July 2023 was negative for stroke. Neuro exam showed her facial drooping was not consistent. Her facial weakness would spontaneously improved when she was distracted during the repeated interviews. The record at [facility] showed that she had frequent ED visit due to similar symptoms or chest pains."     Admission to [facility] in 06/2022 for chest pain and hypertensive urgency. CT chest and then MRA chest done to rule out aortic dissection negative. Prior multiple MRI and MRA chest in Dec/2023 negative. MRI brain and MRA head&neck in Dec/2023 negative for stroke. "      See HPI for details.     Hospital Course   Following admission, patient was placed on aspirin and statins, patient then had MRI  of the brain without contrast done which came back negative for acute issues, with that patient was started on was valacyclovir and prednisone p.o. for suspected Bell's palsy, patient has right-sided weakness .        Patient was complaining of having chest pain, cardiac enzymes and EKG was negative, patient has history of ascending aortic aneurysm measuring in Dec 2023 of 4 cm this time had MRA of chest which showed 4.1 cm, could be reader variance, discussed with her about blood pressure control and follow up with PCP for future imaging.      Patient has other medical history including hypertension, seizure disorder, hyperlipidemia, she was given medication as before, at this time patient is cleared for discharge home.       Procedures:   MRA Chest WO Contrast   Final Result        1. No evidence for a thoracic aortic dissection on these limited   noncontrast MRA images.    2. Mild aneurysmal dilatation of the ascending aorta.       Carolyn Stare, MD   07/20/2022 2:02 PM       MRI Brain WO Contrast   Final Result           1. No acute intracranial finding.       2. Few nonspecific supratentorial white matter lesions as detailed above   consistent with chronic injury. These may reflect mild chronic small vessel   ischemic changes, or sequela of previous infection/inflammation or   migraines for instance.       Gaylyn Rong, MD   07/20/2022 1:47 PM       CT Chest Abdomen Pelvis WO Contrast   Final Result       1. No evidence for acute intrathoracic pathology.   2. Dilatation of the ascending thoracic aorta measuring 4.3 cm.   3. Thickening involving the ascending colon extending to the hepatic   flexure compatible with colitis.       Collene Schlichter, MD   07/19/2022 9:09 PM       CT Head WO Contrast   Final Result       No intracranial hemorrhage or acute intracranial findings.       Brenton Grills, MD   07/19/2022 9:12 PM       XR Chest 2 Views   Final Result       No acute intrathoracic process.       Shelly Flatten,  MD  07/19/2022 7:01 PM             Consultants  Treatment Team:   [name] Spare, MD  Luana Shu, MD  Arma Heading, MD  Ronny Bacon, MD  [name] Spare, MD  Gittinger, Dessie Coma, SLP  Bucu, Ivy Lynn, RN       Best Practices   Was the patient admitted with either a CHF Exacerbation or Pneumonia? No      Progress Note/Physical Exam at Discharge      Subjective: No complaints     Vitals          Vitals:     07/20/22 2317 07/21/22 0422 07/21/22 0716 07/21/22 0827   BP: 120/74 121/83 109/68 109/68   Pulse: 82         Resp: 16 16 15      Temp: 97.9 F (36.6 C) 98 F (36.7 C) 97.6 F (36.4 C)     TempSrc: Oral Oral Oral     SpO2: 92% 95% 100%     Weight:           Height:                    General: NAD, AAOx3  HEENT: perrla, eomi, sclera anicteric, OP: Clear, MMM  Neck: supple, FROM, no LAD  Cardiovascular: RRR, no m/r/g  Lungs: CTAB, no w/r/r  Abdomen: soft, +BS, NT/ND, no masses, no g/r  Extremities: no C/C/E  Neuro: No weakness, no sensory loss, cranial nerves are intact  Skin: no rashes or lesions noted     Admission Condition: stable  Discharge Condition: good  Functional Status: Patient is independent with mobility/ambulation, transfers, ADL's, IADL's.      Diagnostics      Labs/Studies Pending at Discharge: No     Last Labs       Recent Labs   Lab 07/19/22  1854   WBC 9.24   RBC 4.70   Hgb 14.4   Hematocrit 44.9*   MCV 95.5   Platelets 158               Recent Labs   Lab 07/21/22  0435 07/20/22  0540 07/19/22  1854   Sodium 137  --  143   Potassium 4.3 3.3* 3.3*   Chloride 107  --  107   CO2 20  --  26   BUN 13.0  --  13.0   Creatinine 1.0  --  0.9   Glucose 202*  --  99   Calcium 9.3  --  9.6   Magnesium  --  2.0  --          Microbiology Results (last 15 days)         ** No results found for the last 360 hours. **                Patient Instructions   Discharge Diet: cardiac diet  Discharge Activity:  activity as tolerated  Discharge  instructions:  Discharge Code Status: Full code   Additional instructions - continue medications and complete as prescribed  Disposition:  Home  Follow Up Appointment:    Follow-up Information         [name] Foley, NP. Schedule an appointment as soon as possible for a visit in 1 week(s).    Specialty: Nurse Practitioner  Contact information:  [address]  [place] [address]  [phone]  See [name] Foley, NP in 1-2 weeks.            The review of the patient's medications does not in any way constitute an endorsement, by this clinician,  of their use, dosage, indications, route, efficacy, interactions, or other clinical parameters.     This note was generated within the EPIC EMR using Dragon medical speech recognition software and may contain inherent errors or omissions not intended by the user. Grammatical and punctuation errors, random word insertions, deletions, pronoun errors and incomplete sentences are occasional consequences of this technology due to software limitations. Not all errors are caught or corrected.  Although every attempt is made to root out erroneus and incomplete transcription, the note may still not fully represent the intent or opinion of the author. If there are questions or concerns about the content of this note or information contained within the body of this dictation they should be addressed directly with the author for clarification.     Time spent examining patient, discussing with patient/family regarding hospital course face to face, chart review, reconciling medications and discharge planning:  total time : 20 minutes.     Signed,  Kari Baars, MD  10:16 AM 07/21/2022      CC to: [name] Foley, NP                          Routing History       Riki Rusk, OT   Occupational Therapist  Physical Medicine and Rehabilitation     OT Eval Note      Signed     Date of Service: 07/21/2022  9:21 AM  Creation Time: 07/21/2022  9:21 AM      Signed       Expand All Collapse All    Occupational Therapy Eval [name] Cook        Post Acute Care Therapy Recommendations:      Discharge Recommendations:  Acute Rehab (may progress to home)        If Acute Rehab  recommended discharge disposition is not available, patient will need Hands on assist for adls,functional mobility HHOT and HHA.      DME needs IF patient is discharging home: Front wheel walker, BSC, Shower chair     Therapy discharge recommendations may change with patient status.  Please refer to most recent note for up-to-date recommendations.     Assessment:   Significant Findings: None     Dasia Johncox is a 47 y.o. female admitted 07/19/2022.  Patient presents with Right-sided facial weakness. Patient needed assistance w/bed mobility, functional ambulation to bathroom and LB dressing. Patient has 26 steps to enter and 3 more flight inside to get access to her room. She lives with spouse who is out all day for work. Currently pt presents with unsteady gait, numbness R foot, decreased adl I and unsafe to go home alone. Patient motivated to participate and may benefit from AR to decrease burden of care. Pt is performing below functional baseline and will benefit from continued OT to maximize independence and safety with ADLs and functional transfers/mobility.            Impairments: Assessment: decreased independence with ADLs     Therapy Diagnosis: decreased adl I     Rehabilitation Potential: Prognosis: Good;With continued OT s/p acute discharge      Treatment Activities: OT Eval, adl re training, functional mobility     Educated the patient to  role of occupational therapy, plan of care, goals of therapy and HEP.     Plan:   OT Frequency Recommended: 4-5x/wk      Treatment Interventions: ADL retraining;Functional transfer training;Patient/Family training;Equipment eval/education      Risks/benefits/POC discussed w/patient        Unit: [facility] TOWER 6                    Bed:  G956/O130.86           Precautions and Contraindications:   Precautions  Weight Bearing Status: no restrictions  Precaution Instructions Given to Patient: Yes  Other Precautions: falls        Consult received for [name] Cook for OT Evaluation and Treatment.  Patient's medical condition is appropriate for Occupational Therapy intervention at this time.     Admitting Diagnosis: Facial droop [R29.810]        History of Present Illness:    Adeleigh Hosaka is a 47 y.o. female admitted on 07/19/2022 with h/o HTN, HLD, thoracic aortic aneurysm,  morbid obesity, migraine, palpitations, stroke in 09/2013 per patient (MRI report says no evidence of an acute infarct, it showed a single focal 3 mm area of signal alteration in the cerebral white matter tracts in the left frontal region), seizure disorder (on keppra), and allergy to multiple medications who presented to the emergency room on 07/19/2022 with right facial droop and chest pain for the last 4 days. Symptoms have been persistent. She describes the chest pain as being mid-sternal region 2 days ago, dull, persistent, radiates to bilateral neck and flanks and back. She also states she had double vision, nausea/vomiting, and passed out prior to coming to ED. She denies any headache.      Patient was admitted at [facility] on 07/16/22 for the above complaints. Her BP was markedly elevated with systolic around 230. CT head and ECHO with bubble study was unremarkable. Neurologist consult was requested,  consult note states "I knew this pt well when she was admitted to [facility] in July and September 2023 for right facial drooping and right side weakness. The brain MRI in July 2023 was negative for stroke. Neuro exam showed her facial drooping was not consistent. Her facial weakness would spontaneously improved when she was distracted during the repeated interviews. The record at [facility] showed that she had frequent ED visit due to similar symptoms or chest pains."      Admission to [facility] in 06/2022 for chest pain and hypertensive urgency. Right-sided facial weakness         Past Medical/Surgical History:  Medical History        Past Medical History:   Diagnosis Date    Hyperlipidemia      Hypertension      Migraine      Morbid obesity      Palpitations       loop recorder placed in 2019, had to be removed after a month due to misplacement and keloid formation per patient    Seizures      Stroke       2015    Thoracic aortic aneurysm      TIA (transient ischemic attack) 2017         Past Surgical History         Past Surgical History:   Procedure Laterality Date    APPENDECTOMY (OPEN)        CHOLECYSTECTOMY  EGD, BIOPSY N/A 04/16/2017     Procedure: EGD, BIOPSY;  Surgeon: Pershing Proud, MD;  Location: [facility];  Service: Gastroenterology;  Laterality: N/A;    HYSTERECTOMY                      Imaging/Tests/Labs:  MRA Chest WO Contrast     Result Date: 07/20/2022    1. No evidence for a thoracic aortic dissection on these limited noncontrast MRA images. 2. Mild aneurysmal dilatation of the ascending aorta. Carolyn Stare, MD 07/20/2022 2:02 PM     MRI Brain WO Contrast     Result Date: 07/20/2022  1. No acute intracranial finding. 2. Few nonspecific supratentorial white matter lesions as detailed above consistent with chronic injury. These may reflect mild chronic small vessel ischemic changes, or sequela of previous infection/inflammation or migraines for instance. Gaylyn Rong, MD 07/20/2022 1:47 PM       Social History:   Prior Level of Function:  Prior level of function: Independent with ADLs, Ambulates independently  Baseline Activity Level: Community ambulation  Ambulated 100 feet or more prior to admission: Yes  DME Currently at Home:  (none)     Home Living Arrangements:  Living Arrangements: Spouse/significant other, Children  Type of Home: House  Home Layout: Multi-level, Stairs to enter with rails (add number in comment) (26)  Bathroom Shower/Tub: Tub/shower  unit  Bathroom Toilet: Standard  DME Currently at Home:  (none)        Subjective:I have a lot of stairs     Patient is agreeable to participation in the therapy session.         Patient Goal:  home     Pain:4/10  R side -chest  Pre medicated via nsg        Objective:      Observation of Patient/Vital Signs:  Patient is in bed with dressings, SCD's, and peripheral IV in place.  Pt wore mask during therapy session:No       Cognitive Status and Neuro Exam:   Alert O x4   Able to follow all commands  Minimally impaired co-ordination        Musculoskeletal Examination  Gross ROM  Right Upper Extremity ROM: within functional limits  Left Upper Extremity ROM: within functional limits     Gross Strength  Right Upper Extremity Strength: within functional limits  Left Upper Extremity Strength: within functional limits           Sensory/Oculomotor Examination   Touch-numbness R foot-toes  Vision- appears intact        Activities of Daily Living  Self-care and Home Management  Eating: Setup  Grooming: Minimal Assist  Bathing: Moderate Assist  UB Dressing: Stand by Assist  LB Dressing: Minimal Assist  Toileting: Minimal Assist  Functional Transfers: Minimal Assist     Functional Mobility:  Mobility and Transfers  Supine to Sit: Minimal Assist  Sit to Supine: Minimal Assist  Sit to Stand: Minimal Assist  Functional Mobility/Ambulation: Minimal Assist     PMP Activity: Step 6 - Walks in Room      Balance  Balance  Static Sitting Balance: good  Dyanamic Sitting Balance: fair     Participation and Activity Tolerance  Participation and Endurance  Participation Effort: good  Endurance: Tolerates 10 - 20 min exercise with multiple rests     Patient left with call bell within reach, all needs met, SCDs off, fall mat on, bed alarm on, chair alarm  off and all questions answered. RN notified of session outcome and patient response.         Goals:  Time For Goal Achievement: 5 visits  ADL Goals  Patient will dress lower body: Modified  Independent  Patient will toilet: Modified Independent  Mobility and Transfer Goals  Pt will perform functional transfers: Modified Independent        PPE worn during session: procedural mask and gloves     Tech present: n/a  PPE worn by tech: N/A             Time of Treatment:   OT Received On: 07/21/22  Start Time: 0820  Stop Time: 0900  Time Calculation (min): 40 min            Tora Perches    OTR/L,CSRS  Pager [phone]

## 2022-07-25 NOTE — Progress Notes (Signed)
[facility] following referral , spoke to patient today   D/C on the 10th but there was no accepting agency  then and was referred to outpatient.    Per patient her spouse and family has been helping her a lot when she got home and she did not think she needed home health services nor outpatient therapies at this point.

## 2022-07-27 NOTE — Progress Notes (Signed)
AMBULATORY CARE MANAGEMENT Outreach Call    Hospital patient admitted to: [facility] Admission/Discharge Dates:   5/8 to 07/21/2022     Reason for admission to hospital: Facial droop     Name: [name] Cook    ### Patient Details  Date of Birth: [birth year]  MRN: [id number]    ### Encounter Details  Arrival Date: 07/19/2022 06:56 PM EDT  Discharge Date: 07/21/2022 03:57 PM EDT  Encounter ID: [id number] TCM5/12/2022   3:57:00PM    ### Related interaction  [facility] T[facility] Post Discharge Outreach (Post Discharge TCM Outreach 1) ([email or web address])    ### Required Interventions and Feedback     Call Status         Call Status:     Attempt 3 (edited by DN on 07/27/2022 11:02 AM EDT)     Additional Questions         Comments:  Patient received an automated post-discharge outreach call.  Person accepting call hung up.  No further action needed at this time. (edited by DN on 07/27/2022 11:02 AM EDT)    Arma Heading, ACM-SW  Ambulatory Case Manager  [facility]  [address]  [place], [place] [address]  [place] [phone]  [email or web address]

## 2022-09-08 ENCOUNTER — Emergency Department
Admission: EM | Admit: 2022-09-08 | Discharge: 2022-09-10 | Disposition: A | Discharge status: Home / Self Care | Payer: No Typology Code available for payment source | Attending: Hospitalist | Admitting: Hospitalist

## 2022-09-08 ENCOUNTER — Emergency Department: Payer: No Typology Code available for payment source

## 2022-09-08 DIAGNOSIS — R079 Chest pain, unspecified: Secondary | ICD-10-CM

## 2022-09-08 DIAGNOSIS — Z79899 Other long term (current) drug therapy: Secondary | ICD-10-CM | POA: Insufficient documentation

## 2022-09-08 DIAGNOSIS — R29810 Facial weakness: Secondary | ICD-10-CM | POA: Insufficient documentation

## 2022-09-08 LAB — LAB USE ONLY - CBC WITH DIFFERENTIAL
Absolute Basophils: 0.04 10*3/uL (ref 0.00–0.08)
Absolute Eosinophils: 0.18 10*3/uL (ref 0.00–0.44)
Absolute Immature Granulocytes: 0.02 10*3/uL (ref 0.00–0.07)
Absolute Lymphocytes: 2.93 10*3/uL (ref 0.42–3.22)
Absolute Monocytes: 0.71 10*3/uL (ref 0.21–0.85)
Absolute Neutrophils: 5.81 10*3/uL (ref 1.10–6.33)
Absolute nRBC: 0 10*3/uL (ref <=0.00)
Basophils %: 0.4 %
Eosinophils %: 1.9 %
Hematocrit: 41.6 % (ref 34.7–43.7)
Hemoglobin: 13.7 g/dL (ref 11.4–14.8)
Immature Granulocytes %: 0.2 %
Lymphocytes %: 30.2 %
MCH: 30.9 pg (ref 25.1–33.5)
MCHC: 32.9 g/dL (ref 31.5–35.8)
MCV: 93.9 fL (ref 78.0–96.0)
MPV: 13.8 fL — ABNORMAL HIGH (ref 8.9–12.5)
Monocytes %: 7.3 %
Neutrophils %: 60 %
Platelet Count: 137 10*3/uL — ABNORMAL LOW (ref 142–346)
Preliminary Absolute Neutrophil Count: 5.81 10*3/uL (ref 1.10–6.33)
RBC: 4.43 10*6/uL (ref 3.90–5.10)
RDW: 13 % (ref 11–15)
WBC: 9.69 10*3/uL — ABNORMAL HIGH (ref 3.10–9.50)
nRBC %: 0 /100 WBC (ref <=0.0)

## 2022-09-08 LAB — WHOLE BLOOD GLUCOSE POCT: Whole Blood Glucose POCT: 83 mg/dL (ref 70–100)

## 2022-09-08 NOTE — EDIE (Signed)
[facility]?NOTIFICATION?09/08/2022 22:01?[name]?MRN: [id number]    [facility]'s patient encounter information:   VWU:?[id number]  Account [id number]  Billing Account [id number]      Criteria Met      3 Different Facilities in 90 Days    5 ED Visits in 12 Months    Security and Safety  No Security Events were found.  ED Care Guidelines  There are currently no ED Care Guidelines for this patient. Please check your facility's medical records system.        Prescription Monitoring Program  Narx Score not available at this time.    E.D. Visit Count (12 mo.)  Facility Visits   [facility] 3   [place] [facility] 2   [place] [facility] 1   Total 6   Note: Visits indicate total known visits.     Recent Emergency Department Visit Summary  Date Facility [facility] Type Diagnoses or Chief Complaint    Sep 08, 2022  [place] [facility]  [name]  [place]  Emergency      numbness in face      Jul 19, 2022  [place] [facility]  [place]  [place]  Emergency      Facial weakness      Facial Droop      Chest Pain      triage- CP, facial droop x 4 days      triage      Jun 30, 2022   [facility]  [place]    Emergency      Chest pain, unspecified      Chest Pain      Shortness of Breath      triage - Chest Pain      triage      Jun 12, 2022  [facility] Rhett Bannister  MD  Emergency      Syncope and collapse      Chest pain, unspecified      Hypokalemia      Pain, Chest      Light Headed, DIzzy, back pain, Chest pain      Apr 04, 2022  [facility] Rhett Bannister  MD  Emergency      Other chest pain      Essential (primary) hypertension      Chest pain, unspecified      Pain, Rib      Chest Pain,SOB,Left Side Body Pain      Oct 19, 2021  [name] Rhett Bannister  MD  Emergency      Headache, unspecified      Facial myokymia      Hypertension      Headache      high BP face twitching        Recent Inpatient Visit Summary  Date Facility [facility] Type  Diagnoses or Chief Complaint    Nov 30, 2021  [name] Rhett Bannister  MD  Internal Medicine      Weakness        Care Team  No Care Team was found.  [facility]  This patient has registered at the [facility] Emergency Department  For more information visit: [email or web address]     PLEASE NOTE:     1.   Any care recommendations and other clinical information are provided as guidelines or for historical purposes only, and providers should exercise  their own clinical judgment when providing care.    2.   You may only use this information for purposes of treatment, payment or health care operations activities, and subject to the limitations of applicable [facility] Policies.    3.   You should consult directly with the organization that provided a care guideline or other clinical history with any questions about additional information or accuracy or completeness of information provided.    ? [id number] [facility] - [email or web address]

## 2022-09-08 NOTE — ED Notes (Signed)
Bed: E14  Expected date:   Expected time:   Means of arrival:   Comments:  MEDIC

## 2022-09-09 ENCOUNTER — Observation Stay: Payer: No Typology Code available for payment source

## 2022-09-09 ENCOUNTER — Observation Stay (HOSPITAL_BASED_OUTPATIENT_CLINIC_OR_DEPARTMENT_OTHER): Payer: No Typology Code available for payment source

## 2022-09-09 DIAGNOSIS — I639 Cerebral infarction, unspecified: Secondary | ICD-10-CM

## 2022-09-09 DIAGNOSIS — R531 Weakness: Secondary | ICD-10-CM

## 2022-09-09 DIAGNOSIS — R072 Precordial pain: Secondary | ICD-10-CM

## 2022-09-09 DIAGNOSIS — R29810 Facial weakness: Secondary | ICD-10-CM

## 2022-09-09 DIAGNOSIS — I10 Essential (primary) hypertension: Secondary | ICD-10-CM

## 2022-09-09 DIAGNOSIS — E782 Mixed hyperlipidemia: Secondary | ICD-10-CM

## 2022-09-09 DIAGNOSIS — Z6841 Body Mass Index (BMI) 40.0 and over, adult: Secondary | ICD-10-CM

## 2022-09-09 DIAGNOSIS — R2990 Unspecified symptoms and signs involving the nervous system: Secondary | ICD-10-CM

## 2022-09-09 DIAGNOSIS — I160 Hypertensive urgency: Secondary | ICD-10-CM

## 2022-09-09 DIAGNOSIS — I159 Secondary hypertension, unspecified: Secondary | ICD-10-CM

## 2022-09-09 DIAGNOSIS — Z8673 Personal history of transient ischemic attack (TIA), and cerebral infarction without residual deficits: Secondary | ICD-10-CM

## 2022-09-09 LAB — ECHO ADULT TTE COMPLETE
AV Mean Gradient: 2
AV Peak Velocity: 1.06
IVS Diastolic Thickness (2D): 1.21
LA Dimension (2D): 2.9
LA Volume Index (BP A-L): 16
LVID diastole (2D): 4.97
LVID systole (2D): 2.68
MV Area (PHT): 3.737
MV E/A: 1.25
MV E/A: 1.3
MV E/e' (Average): 9.729
Mitral Valve Findings: NORMAL
Prox Ascending Aorta Diameter: 3.6
RV Function: NORMAL
RV Systolic Pressure: 30
RV Systolic Pressure: 32.376
Site RA Size (AS): NORMAL
Site RV Size (AS): NORMAL
TAPSE: 2.3

## 2022-09-09 LAB — LAB USE ONLY - URINALYSIS WITH REFLEX TO MICROSCOPIC EXAM AND CULTURE
Urine Bilirubin: NEGATIVE
Urine Blood: NEGATIVE
Urine Glucose: NEGATIVE
Urine Leukocyte Esterase: NEGATIVE
Urine Nitrite: NEGATIVE
Urine Protein: NEGATIVE
Urine Specific Gravity: 1.021 (ref 1.001–1.035)
Urine Urobilinogen: NORMAL mg/dL (ref 0.2–2.0)
Urine pH: 6 (ref 5.0–8.0)

## 2022-09-09 LAB — LAB USE ONLY - CBC WITH DIFFERENTIAL
Absolute Basophils: 0.04 10*3/uL (ref 0.00–0.08)
Absolute Eosinophils: 0.16 10*3/uL (ref 0.00–0.44)
Absolute Immature Granulocytes: 0.03 10*3/uL (ref 0.00–0.07)
Absolute Lymphocytes: 2.24 10*3/uL (ref 0.42–3.22)
Absolute Monocytes: 0.49 10*3/uL (ref 0.21–0.85)
Absolute Neutrophils: 5.32 10*3/uL (ref 1.10–6.33)
Absolute nRBC: 0 10*3/uL (ref <=0.00)
Basophils %: 0.5 %
Eosinophils %: 1.9 %
Hematocrit: 41.1 % (ref 34.7–43.7)
Hemoglobin: 13.4 g/dL (ref 11.4–14.8)
Immature Granulocytes %: 0.4 %
Lymphocytes %: 27.1 %
MCH: 31.2 pg (ref 25.1–33.5)
MCHC: 32.6 g/dL (ref 31.5–35.8)
MCV: 95.8 fL (ref 78.0–96.0)
MPV: 13.9 fL — ABNORMAL HIGH (ref 8.9–12.5)
Monocytes %: 5.9 %
Neutrophils %: 64.2 %
Platelet Count: 125 10*3/uL — ABNORMAL LOW (ref 142–346)
Preliminary Absolute Neutrophil Count: 5.32 10*3/uL (ref 1.10–6.33)
RBC: 4.29 10*6/uL (ref 3.90–5.10)
RDW: 13 % (ref 11–15)
WBC: 8.28 10*3/uL (ref 3.10–9.50)
nRBC %: 0 /100 WBC (ref <=0.0)

## 2022-09-09 LAB — ECG 12-LEAD
Atrial Rate: 67 {beats}/min
Atrial Rate: 61 {beats}/min
IHS MUSE NARRATIVE AND IMPRESSION: NORMAL
P Axis: 41 degrees
P Axis: 42 degrees
P-R Interval: 144 ms
P-R Interval: 158 ms
Q-T Interval: 444 ms
Q-T Interval: 492 ms
QRS Duration: 84 ms
QRS Duration: 96 ms
QTC Calculation (Bezet): 469 ms
QTC Calculation (Bezet): 495 ms
R Axis: 11 degrees
R Axis: 5 degrees
T Axis: 26 degrees
T Axis: 10 degrees
Ventricular Rate: 67 {beats}/min
Ventricular Rate: 61 {beats}/min

## 2022-09-09 LAB — COMPREHENSIVE METABOLIC PANEL
ALT: 24 U/L (ref 0–55)
AST (SGOT): 22 U/L (ref 5–41)
Albumin/Globulin Ratio: 0.9 (ref 0.9–2.2)
Albumin: 3.7 g/dL (ref 3.5–5.0)
Alkaline Phosphatase: 110 U/L (ref 37–117)
Anion Gap: 9 (ref 5.0–15.0)
BUN: 11 mg/dL (ref 7–21)
Bilirubin, Total: 1 mg/dL (ref 0.2–1.2)
CO2: 26 mEq/L (ref 17–29)
Calcium: 8.7 mg/dL (ref 8.5–10.5)
Chloride: 108 mEq/L (ref 99–111)
Creatinine: 0.8 mg/dL (ref 0.4–1.0)
GFR: >60 mL/min/{1.73_m2} (ref >=60.0)
Globulin: 4 g/dL — ABNORMAL HIGH (ref 2.0–3.6)
Glucose: 120 mg/dL — ABNORMAL HIGH (ref 70–100)
Potassium: 3.5 mEq/L (ref 3.5–5.3)
Protein, Total: 7.7 g/dL (ref 6.0–8.3)
Sodium: 143 mEq/L (ref 135–145)

## 2022-09-09 LAB — HEPATIC FUNCTION PANEL (LFT)
ALT: 23 U/L (ref 0–55)
AST (SGOT): 21 U/L (ref 5–41)
Albumin/Globulin Ratio: 0.9 (ref 0.9–2.2)
Albumin: 3.7 g/dL (ref 3.5–5.0)
Alkaline Phosphatase: 108 U/L (ref 37–117)
Bilirubin Direct: 0.2 mg/dL (ref 0.0–0.5)
Bilirubin Indirect: 0.6 mg/dL (ref 0.2–1.0)
Bilirubin, Total: 0.8 mg/dL (ref 0.2–1.2)
Globulin: 3.9 g/dL — ABNORMAL HIGH (ref 2.0–3.6)
Protein, Total: 7.6 g/dL (ref 6.0–8.3)

## 2022-09-09 LAB — HIGH SENSITIVITY TROPONIN-I
hs Troponin: 3.9 ng/L (ref <=14.0)
hs Troponin: 3.9 ng/L (ref <=14.0)

## 2022-09-09 LAB — HIGH SENSITIVITY TROPONIN-I WITH DELTA
hs Troponin-I Delta: 0
hs Troponin: 4.2 ng/L (ref <=14.0)

## 2022-09-09 LAB — BASIC METABOLIC PANEL
Anion Gap: 10 (ref 5.0–15.0)
BUN: 13 mg/dL (ref 7–21)
CO2: 24 mEq/L (ref 17–29)
Calcium: 9.1 mg/dL (ref 8.5–10.5)
Chloride: 109 mEq/L (ref 99–111)
Creatinine: 0.9 mg/dL (ref 0.4–1.0)
GFR: >60 mL/min/{1.73_m2} (ref >=60.0)
Glucose: 87 mg/dL (ref 70–100)
Potassium: 3.8 mEq/L (ref 3.5–5.3)
Sodium: 143 mEq/L (ref 135–145)

## 2022-09-09 LAB — SERUM HCG, QUALITATIVE: hCG Qualitative: NEGATIVE

## 2022-09-09 LAB — MAGNESIUM: Magnesium: 2 mg/dL (ref 1.6–2.6)

## 2022-09-09 LAB — WHOLE BLOOD GLUCOSE POCT: Whole Blood Glucose POCT: 137 mg/dL — ABNORMAL HIGH (ref 70–100)

## 2022-09-09 LAB — LAB USE ONLY - URINE GRAY CULTURE HOLD TUBE

## 2022-09-09 MED ORDER — FAMOTIDINE 20 MG PO TABS
20.0000 mg | ORAL_TABLET | Freq: Two times a day (BID) | ORAL | Status: DC
Start: 2022-09-09 — End: 2022-09-10
  Administered 2022-09-09 – 2022-09-10 (×3): 20 mg via ORAL
  Filled 2022-09-09 (×3): qty 1

## 2022-09-09 MED ORDER — NALOXONE HCL 0.4 MG/ML IJ SOLN (WRAP)
0.2000 mg | INTRAMUSCULAR | Status: DC | PRN
Start: 2022-09-09 — End: 2022-09-10

## 2022-09-09 MED ORDER — DIAZEPAM 5 MG/ML IJ SOLN
5.0000 mg | Freq: Once | INTRAMUSCULAR | Status: AC
Start: 2022-09-09 — End: 2022-09-09
  Administered 2022-09-09: 5 mg via INTRAVENOUS
  Filled 2022-09-09: qty 2

## 2022-09-09 MED ORDER — VALACYCLOVIR HCL 500 MG PO TABS
1000.0000 mg | ORAL_TABLET | Freq: Three times a day (TID) | ORAL | Status: DC
Start: 2022-09-09 — End: 2022-09-10
  Administered 2022-09-09 – 2022-09-10 (×3): 1000 mg via ORAL
  Filled 2022-09-09 (×3): qty 2

## 2022-09-09 MED ORDER — POTASSIUM & SODIUM PHOSPHATES 280-160-250 MG PO PACK
2.0000 | PACK | ORAL | Status: DC | PRN
Start: 2022-09-09 — End: 2022-09-10

## 2022-09-09 MED ORDER — HYDRALAZINE HCL 25 MG PO TABS
25.0000 mg | ORAL_TABLET | Freq: Three times a day (TID) | ORAL | Status: DC
Start: 2022-09-09 — End: 2022-09-09
  Administered 2022-09-09: 25 mg via ORAL
  Filled 2022-09-09: qty 1

## 2022-09-09 MED ORDER — HYDROCHLOROTHIAZIDE 25 MG PO TABS
25.0000 mg | ORAL_TABLET | Freq: Every day | ORAL | Status: DC
Start: 2022-09-09 — End: 2022-09-09

## 2022-09-09 MED ORDER — HYDRALAZINE HCL 20 MG/ML IJ SOLN
10.0000 mg | INTRAMUSCULAR | Status: DC | PRN
Start: 2022-09-09 — End: 2022-09-10

## 2022-09-09 MED ORDER — LABETALOL HCL 5 MG/ML IV SOLN (WRAP)
10.0000 mg | INTRAVENOUS | Status: DC | PRN
Start: 2022-09-09 — End: 2022-09-10

## 2022-09-09 MED ORDER — BENZONATATE 100 MG PO CAPS
100.0000 mg | ORAL_CAPSULE | Freq: Three times a day (TID) | ORAL | Status: DC | PRN
Start: 2022-09-09 — End: 2022-09-10

## 2022-09-09 MED ORDER — DEXTROSE 50 % IV SOLN
12.5000 g | INTRAVENOUS | Status: DC | PRN
Start: 2022-09-09 — End: 2022-09-10

## 2022-09-09 MED ORDER — BENZOCAINE-MENTHOL MT LOZG (WRAP)
1.0000 | LOZENGE | OROMUCOSAL | Status: DC | PRN
Start: 2022-09-09 — End: 2022-09-10

## 2022-09-09 MED ORDER — DEXTROSE 10 % IV BOLUS
12.5000 g | INTRAVENOUS | Status: DC | PRN
Start: 2022-09-09 — End: 2022-09-10

## 2022-09-09 MED ORDER — ATORVASTATIN CALCIUM 10 MG PO TABS
40.0000 mg | ORAL_TABLET | Freq: Every day | ORAL | Status: DC
Start: 2022-09-09 — End: 2022-09-10
  Administered 2022-09-09: 40 mg via ORAL
  Filled 2022-09-09: qty 4

## 2022-09-09 MED ORDER — POTASSIUM CHLORIDE 10 MEQ/100ML IV SOLN
10.0000 meq | INTRAVENOUS | Status: DC | PRN
Start: 2022-09-09 — End: 2022-09-10

## 2022-09-09 MED ORDER — MELATONIN 3 MG PO TABS
3.0000 mg | ORAL_TABLET | Freq: Every evening | ORAL | Status: DC | PRN
Start: 2022-09-09 — End: 2022-09-10

## 2022-09-09 MED ORDER — MAGNESIUM SULFATE IN D5W 1-5 GM/100ML-% IV SOLN
1.0000 g | INTRAVENOUS | Status: DC | PRN
Start: 2022-09-09 — End: 2022-09-10

## 2022-09-09 MED ORDER — CARBOXYMETHYLCELLULOSE SOD PF 0.5 % OP SOLN
1.0000 [drp] | Freq: Three times a day (TID) | OPHTHALMIC | Status: DC | PRN
Start: 2022-09-09 — End: 2022-09-10

## 2022-09-09 MED ORDER — ALUM & MAG HYDROXIDE-SIMETH 200-200-20 MG/5ML PO SUSP
30.0000 mL | ORAL | Status: DC | PRN
Start: 2022-09-09 — End: 2022-09-10
  Administered 2022-09-09: 30 mL via ORAL
  Filled 2022-09-09: qty 30

## 2022-09-09 MED ORDER — CLOPIDOGREL BISULFATE 75 MG PO TABS
75.0000 mg | ORAL_TABLET | Freq: Every day | ORAL | Status: DC
Start: 2022-09-10 — End: 2022-09-10
  Administered 2022-09-10: 75 mg via ORAL
  Filled 2022-09-09: qty 1

## 2022-09-09 MED ORDER — CLOPIDOGREL BISULFATE 75 MG PO TABS
300.0000 mg | ORAL_TABLET | Freq: Once | ORAL | Status: AC
Start: 2022-09-09 — End: 2022-09-09
  Administered 2022-09-09: 300 mg via ORAL
  Filled 2022-09-09: qty 4

## 2022-09-09 MED ORDER — ONDANSETRON 4 MG PO TBDP
4.0000 mg | ORAL_TABLET | Freq: Three times a day (TID) | ORAL | Status: DC | PRN
Start: 2022-09-09 — End: 2022-09-10

## 2022-09-09 MED ORDER — POTASSIUM CHLORIDE CRYS ER 20 MEQ PO TBCR
0.0000 meq | EXTENDED_RELEASE_TABLET | ORAL | Status: DC | PRN
Start: 2022-09-09 — End: 2022-09-10
  Administered 2022-09-09: 40 meq via ORAL
  Filled 2022-09-09: qty 2

## 2022-09-09 MED ORDER — LIDOCAINE 5 % EX PTCH
1.0000 | MEDICATED_PATCH | CUTANEOUS | Status: DC
Start: 2022-09-09 — End: 2022-09-10
  Administered 2022-09-09: 1 via TRANSDERMAL
  Filled 2022-09-09: qty 1

## 2022-09-09 MED ORDER — ONDANSETRON HCL 4 MG/2ML IJ SOLN
4.0000 mg | Freq: Once | INTRAMUSCULAR | Status: AC
Start: 2022-09-09 — End: 2022-09-09
  Administered 2022-09-09: 4 mg via INTRAVENOUS
  Filled 2022-09-09: qty 2

## 2022-09-09 MED ORDER — SALINE SPRAY 0.65 % NA SOLN
2.0000 | NASAL | Status: DC | PRN
Start: 2022-09-09 — End: 2022-09-10

## 2022-09-09 MED ORDER — PREDNISONE 20 MG PO TABS
60.0000 mg | ORAL_TABLET | Freq: Every day | ORAL | Status: DC
Start: 2022-09-09 — End: 2022-09-10
  Administered 2022-09-09 – 2022-09-10 (×2): 60 mg via ORAL
  Filled 2022-09-09 (×2): qty 3

## 2022-09-09 MED ORDER — ASPIRIN 81 MG PO CHEW
81.0000 mg | CHEWABLE_TABLET | Freq: Every day | ORAL | Status: DC
Start: 2022-09-09 — End: 2022-09-10
  Administered 2022-09-09 – 2022-09-10 (×2): 81 mg via ORAL
  Filled 2022-09-09 (×2): qty 1

## 2022-09-09 MED ORDER — LEVETIRACETAM 250 MG PO TABS
500.0000 mg | ORAL_TABLET | Freq: Two times a day (BID) | ORAL | Status: DC
Start: 2022-09-09 — End: 2022-09-10
  Administered 2022-09-09 – 2022-09-10 (×3): 500 mg via ORAL
  Filled 2022-09-09 (×3): qty 2

## 2022-09-09 MED ORDER — MORPHINE SULFATE 4 MG/ML IJ/IV SOLN (WRAP)
4.0000 mg | Freq: Once | Status: AC
Start: 2022-09-09 — End: 2022-09-09
  Administered 2022-09-09: 4 mg via INTRAVENOUS
  Filled 2022-09-09: qty 1

## 2022-09-09 MED ORDER — GLUCOSE 40 % PO GEL (WRAP)
15.0000 g | ORAL | Status: DC | PRN
Start: 2022-09-09 — End: 2022-09-10

## 2022-09-09 MED ORDER — HYDROMORPHONE HCL 1 MG/ML IJ SOLN
0.4000 mg | Freq: Four times a day (QID) | INTRAMUSCULAR | Status: DC | PRN
Start: 2022-09-09 — End: 2022-09-10
  Administered 2022-09-09 – 2022-09-10 (×4): 0.4 mg via INTRAVENOUS
  Filled 2022-09-09 (×4): qty 1

## 2022-09-09 MED ORDER — ENOXAPARIN SODIUM 40 MG/0.4ML IJ SOSY
40.0000 mg | PREFILLED_SYRINGE | Freq: Every day | INTRAMUSCULAR | Status: DC
Start: 2022-09-09 — End: 2022-09-10
  Administered 2022-09-09 – 2022-09-10 (×2): 40 mg via SUBCUTANEOUS
  Filled 2022-09-09 (×2): qty 0.4

## 2022-09-09 MED ORDER — GLUCAGON 1 MG IJ SOLR (WRAP)
1.0000 mg | INTRAMUSCULAR | Status: DC | PRN
Start: 2022-09-09 — End: 2022-09-10

## 2022-09-09 MED ORDER — LISINOPRIL 10 MG PO TABS
10.0000 mg | ORAL_TABLET | Freq: Every day | ORAL | Status: DC
Start: 2022-09-09 — End: 2022-09-09

## 2022-09-09 NOTE — H&P (Signed)
ADMISSION HISTORY AND PHYSICAL EXAM    [facility], DIVISION OF HOSPITALIST MEDICINE   [name]   [place] Pager: [phone]      Date Time: 09/09/22 1:12 AM  Patient Name: [name] Cook  Attending Physician: Henderson Newcomer, MD  Primary Care Physician: Unknown Foley, NP    CC: Right-sided facial droop 1 day  History Gathered From: Self    Active Hospital Problems    Diagnosis    Facial droop       Patient has BMI=Body mass index is 46.5 kg/m.  Diagnosis: Obesity Class 3 (formerly known as Morbid Obesity) based on BMI criteria      Recent Labs     09/08/22  2257   Platelet Count 137*     Diagnosis: Mild Thrombocytopenia          Assessment and Plan :   [name] Cook is a 47 y.o. female with a PMHx of hypertension and hyperlipidemia and history of CVA with no residual deficit presents with the above-mentioned complaint.  1 blood pressure reading of 252/134 (likely erroneous) otherwise unremarkable vital signs.  WBC of 9.69.  Chemistry/UA unremarkable unremarkable.  CT head did not show any acute intracranial pathology.  Will admit for rule out CVA    -Admit to [facility] service    1.  Suspected CVA: Does have an obvious left-sided facial droop.  However she is fully alert oriented and neurologically intact. ?  Bell's palsy.  CT head is unremarkable.  Will do MRI neurology to see in AM.    2.  Hypertension: Fairly well-controlled except 1 outlier.  Will do permissive hypertension.    3.  Hyperlipidemia: Check lipid profile continue statin.    Leukocytosis: Mild, likely reactive.  Will monitor closely.      Nutrition:No diet orders on file      Code status: Full code     Status/Disposition:   Pt is admitted under OBSERVATION with above concerns.    Anticipated medical stability for discharge: 24 Hrs       History of Presenting Illnes   [name] Cook is a 47 y.o. female past medical history significant for hypertension hyperlipidemia presents with the above-mentioned complaint.  Patient  states yesterday morning when she woke up she had " pulling facial sensation on the right side".  She did not take too much notice of it and went to work.  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  When she came back home in the evening her son told her that her face is droopy.  For this reason she came to ER.  Denies any vision or speech abnormality.  Denies any swallowing difficulty.  No fever or chills.  No nausea vomiting or diarrhea.    Upon arrival in ER,1 blood pressure reading of 252/134 (likely erroneous) otherwise unremarkable vital signs.  WBC of 9.69.  Chemistry/UA unremarkable unremarkable.  CT head did not show any acute intracranial pathology.  Will admit for rule out CVA    Past Medical Histor     Past Medical History:   Diagnosis Date    Hyperlipidemia     Hypertension     Migraine     Morbid obesity     Palpitations     loop recorder placed in 2019, had to be removed after a month due to misplacement and keloid formation per patient    Seizures     Stroke     2015  Thoracic aortic aneurysm     TIA (transient ischemic attack) 2017         Available old records reviewed, including: EPIC     Past Surgical History:     Past Surgical History:   Procedure Laterality Date    APPENDECTOMY (OPEN)      CHOLECYSTECTOMY      EGD, BIOPSY N/A 04/16/2017    Procedure: EGD, BIOPSY;  Surgeon: Pershing Proud, MD;  Location: [facility];  Service: Gastroenterology;  Laterality: N/A;    HYSTERECTOMY         Family History:     Family History   Problem Relation Age of Onset    Myocardial Infarction Father 74    Deep vein thrombosis Father     No known problems Mother        Social History:    reports that she has never smoked. She has never used smokeless tobacco. She reports that she does not drink alcohol and does not use drugs.    Allergies:     Allergies   Allergen Reactions    Contrast [Iodinated Contrast Media] Anaphylaxis     Patient woke up in ICU after having CT with contrast,  last remembers being in CT    Fentanyl Anaphylaxis     Throat swelling    Fioricet [Butalbital-Apap-Caffeine] Hives    Flexeril [Cyclobenzaprine] Edema     Causes throat swelling per Pt report    Motrin [Ibuprofen] Anaphylaxis     Throat swelling    Tramadol Anaphylaxis     Throat swelling    Bentyl [Dicyclomine] Edema    Percocet [Oxycodone-Acetaminophen] Hives    Shellfish-Derived Products Hives    Toradol [Ketorolac Tromethamine] Hives    Tylenol [Acetaminophen] Hives       Medications:     Home Medications       Med List Status: In Progress Set By: Aundra Millet, RN at 09/08/2022 10:15 PM              alum & mag hydroxide-simethicone (MAALOX PLUS) 200-200-20 mg/5 mL suspension     Take 30 mLs by mouth every 4 (four) hours as needed for Indigestion     atorvastatin (LIPITOR) 40 MG tablet     Take 1 tablet (40 mg total) by mouth daily     famotidine (PEPCID) 20 MG tablet     Take 1 tablet (20 mg) by mouth every 12 (twelve) hours     hydrALAZINE (APRESOLINE) 25 MG tablet     Take 1 tablet (25 mg) by mouth 3 (three) times daily     hydroCHLOROthiazide (HYDRODIURIL) 25 MG tablet     Take 1 tablet (25 mg) by mouth daily     LevETIRAcetam (KEPPRA PO)     Take 500 mg by mouth 2 (two) times daily.        lisinopril (ZESTRIL) 10 MG tablet     Take 1 tablet (10 mg) by mouth daily     ondansetron (ZOFRAN-ODT) 4 MG disintegrating tablet     Take 1 tablet (4 mg) by mouth every 8 (eight) hours as needed for Nausea     valACYclovir HCL (VALTREX) 1000 MG tablet     Take 1 tablet (1,000 mg) by mouth 3 (three) times daily            Method by which medications were confirmed on admission:       Review of Systems:   All other systems were reviewed and  are negative except:as above in HPI     Physical Exam:   Patient Vitals for the past 24 hrs:   BP Temp Temp src Pulse Resp SpO2 Height Weight   09/09/22 0051 174/73 97.6 F (36.4 C) Oral 60 15 97 % -- --   09/08/22 2307 -- -- -- 72 -- 96 % -- --   09/08/22 2306 -- -- -- 62 16 97 %  -- --   09/08/22 2304 167/78 -- -- 65 -- 97 % -- --   09/08/22 2206 (!) 252/134 98.6 F (37 C) Oral 69 -- 94 % 1.778 m (5\' 10" ) 147 kg (324 lb 1.2 oz)     Body mass index is 46.5 kg/m.  No intake or output data in the 24 hours ending 09/09/22 0112    General: WD female in no acute distress.  HEENT: perrla, eomi, sclera anicteric  oropharynx clear without lesions, mucous membranes moist  Neck: supple  Cardiovascular: Normal S1 and S2, no murmurs, rubs or gallops  Lungs: clear to auscultation bilaterally, without wheezing, rhonchi, or rales  Abdomen: soft, non-tender, non-distended; normoactive bowel sounds, no rebound or guarding  Extremities: no edema , pulses palpable   Neuro: Right-sided facial droop alert, oriented x 3, cranial nerves grossly intact, strength 5/5 in upper and lower extremities, sensation intact,   Skin: no rashes or lesions noted  Other:       Labs:     Results       Procedure Component Value Units Date/Time    Serum Beta HCG Qualitative [[id number]]  (Normal) Collected: 09/08/22 2323    Specimen: Blood, Venous Updated: 09/09/22 0026     hCG Qualitative Negative    High Sensitivity Troponin-I at 0 hrs [[id number]]  (Normal) Collected: 09/08/22 2323    Specimen: Blood, Venous Updated: 09/09/22 0014     hs Troponin 3.9 ng/L     Basic Metabolic Panel [[id number]]  (Normal) Collected: 09/08/22 2323    Specimen: Blood, Venous Updated: 09/09/22 0011     Glucose 87 mg/dL      BUN 13 mg/dL      Creatinine 0.9 mg/dL      Calcium 9.1 mg/dL      Sodium 644 mEq/L      Potassium 3.8 mEq/L      Chloride 109 mEq/L      CO2 24 mEq/L      Anion Gap 10.0     GFR >60.0 mL/min/1.73 m2     Hepatic Function Panel (LFT) [[id number]]  (Abnormal) Collected: 09/08/22 2323    Specimen: Blood, Venous Updated: 09/09/22 0011     Bilirubin, Total 0.8 mg/dL      Bilirubin Direct 0.2 mg/dL      Bilirubin Indirect 0.6 mg/dL      AST (SGOT) 21 U/L      ALT 23 U/L      Alkaline Phosphatase 108 U/L      Albumin 3.7 g/dL      Globulin  3.9 g/dL      Albumin/Globulin Ratio 0.9     Protein, Total 7.6 g/dL     Magnesium [[id number]]  (Normal) Collected: 09/08/22 2323    Specimen: Blood, Venous Updated: 09/09/22 0011     Magnesium 2.0 mg/dL     CBC with Differential [[id number]]  (Abnormal) Collected: 09/08/22 2257    Specimen: Blood, Venous Updated: 09/08/22 2328    Narrative:      The following orders were created for panel order CBC with Differential.  Procedure                               Abnormality         Status                     ---------                               -----------         ------                     CBC with Differential[[id number]]        Abnormal            Final result                 Please view results for these tests on the individual orders.    CBC with Differential [[id number]]  (Abnormal) Collected: 09/08/22 2257    Specimen: Blood, Venous Updated: 09/08/22 2328     WBC 9.69 x10 3/uL      Hemoglobin 13.7 g/dL      Hematocrit 40.9 %      Platelet Count 137 x10 3/uL      MPV 13.8 fL      RBC 4.43 x10 6/uL      MCV 93.9 fL      MCH 30.9 pg      MCHC 32.9 g/dL      RDW 13 %      nRBC % 0.0 /100 WBC      Absolute nRBC 0.00 x10 3/uL      Preliminary Absolute Neutrophil Count 5.81 x10 3/uL      Neutrophils % 60.0 %      Lymphocytes % 30.2 %      Monocytes % 7.3 %      Eosinophils % 1.9 %      Basophils % 0.4 %      Immature Granulocytes % 0.2 %      Absolute Neutrophils 5.81 x10 3/uL      Absolute Lymphocytes 2.93 x10 3/uL      Absolute Monocytes 0.71 x10 3/uL      Absolute Eosinophils 0.18 x10 3/uL      Absolute Basophils 0.04 x10 3/uL      Absolute Immature Granulocytes 0.02 x10 3/uL     Whole Blood Glucose POCT [[id number]]  (Normal) Collected: 09/08/22 2210    Specimen: Blood, Capillary Updated: 09/08/22 2212     Whole Blood Glucose POCT 83 mg/dL              EKG:     Imaging personally reviewed, including: all available   XR Chest AP Portable    Result Date: 09/08/2022   No acute intrathoracic abnormality. Campbell Stall  09/08/2022 10:52 PM    CT Head WO Contrast    Result Date: 09/08/2022   1.Normal examination. [name], MD 09/08/2022 10:33 PM         This note was generated by the Epic EMR system/ Dragon speech recognition and may contain inherent errors or omissions not intended by the user. Grammatical errors, random word insertions, deletions and pronoun errors  are occasional consequences of this technology due to software limitations. Not all errors are caught or corrected. If there are questions or concerns about the content of this note or  information contained within the body of this dictation they should be addressed directly with the author for clarification.    Signed by: Henderson Newcomer, MD, MD   GN:[id number], Stanton Kidney, NP

## 2022-09-09 NOTE — OT Progress Note (Signed)
[facility]  Occupational Therapy Attempt Note    Patient:  [name] Cook MRN#:  [id number]    Unit:  [facility] EMERGENCY DEPARTMENT Room/Bed:  E 29/E29      OT Cancellation: Visit  OT Visit Cancellation Reason: Patient/caregiver declines therapy at this time.  Discussed role of OT with pt, reports indep with ADL tasks and has no concerns regarding performance. D/C recommendation home. No acute OT needs identified, d/c OT. Will need new orders if functional status changes.         Signature:   Riccardo Dubin, OT  09/09/2022  2:04 PM    (For scheduling questions, please contact rehab tech [phone])

## 2022-09-09 NOTE — Plan of Care (Signed)
Problem: Pain interferes with ability to perform ADL  Goal: Pain at adequate level as identified by patient  09/09/2022 0259 by Earley Abide, RN  Outcome: Progressing  Flowsheets (Taken 09/09/2022 0259)  Pain at adequate level as identified by patient:   Identify patient comfort function goal   Assess for risk of opioid induced respiratory depression, including snoring/sleep apnea. Alert healthcare team of risk factors identified.   Assess pain on admission, during daily assessment and/or before any "as needed" intervention(s)   Reassess pain within 30-60 minutes of any procedure/intervention, per Pain Assessment, Intervention, Reassessment (AIR) Cycle   Evaluate if patient comfort function goal is met   Evaluate patient's satisfaction with pain management progress   Offer non-pharmacological pain management interventions   Consult/collaborate with Physical Therapy, Occupational Therapy, and/or Speech Therapy   Include patient/patient care companion in decisions related to pain management as needed     Problem: Side Effects from Pain Analgesia  Goal: Patient will experience minimal side effects of analgesic therapy  09/09/2022 0259 by Earley Abide, RN  Outcome: Progressing  Flowsheets (Taken 09/09/2022 0259)  Patient will experience minimal side effects of analgesic therapy:   Monitor/assess patient's respiratory status (RR depth, effort, breath sounds)   Prevent/manage side effects per LIP orders (i.e. nausea, vomiting, pruritus, constipation, urinary retention, etc.)   Evaluate for opioid-induced sedation with appropriate assessment tool (i.e. POSS)   Assess for changes in cognitive function     Problem: Safety  Goal: Patient will be free from injury during hospitalization  09/09/2022 0259 by Earley Abide, RN  Outcome: Progressing  Flowsheets (Taken 09/09/2022 0259)  Patient will be free from injury during hospitalization:   Assess patient's risk for falls and implement fall prevention plan of care per  policy   Use appropriate transfer methods   Include patient/ family/ care giver in decisions related to safety   Assess for patients risk for elopement and implement Elopement Risk Plan per policy   Provide alternative method of communication if needed (communication boards, writing)   Ensure appropriate safety devices are available at the bedside   Hourly rounding   Provide and maintain safe environment     Problem: Safety  Goal: Patient will be free from infection during hospitalization  09/09/2022 0259 by Earley Abide, RN  Outcome: Progressing  Flowsheets (Taken 09/09/2022 0259)  Free from Infection during hospitalization:   Assess and monitor for signs and symptoms of infection   Monitor all insertion sites (i.e. indwelling lines, tubes, urinary catheters, and drains)   Encourage patient and family to use good hand hygiene technique   Monitor lab/diagnostic results     Problem: Day of Admission - Stroke  Goal: Core/Quality measure requirements - Admission  Outcome: Progressing  Flowsheets (Taken 09/09/2022 0251)  Core/Quality measure requirements - Admission:   Document NIH Stroke Scale on admission   Document nursing swallow/dysphagia screen on admission. If patient fails, keep patient NPO (follow your hospital protocol on swallowing screening).   VTE Prevention: Ensure anticoagulant(s) administered and/or anti-embolism stockings/devices documented as ordered   Ensure antithrombotic administered or contraindication documented by LIP   If diagnosis or history of Atrial Fib/Atrial Flutter, ensure oral anticoagulation is initiated or contraindication documented by LIP   Ensure lipid panel ordered   Begin stroke education on admission (must include Modifiable Risk Factors, Warning Signs and Symptoms of Stroke, Activation of Emergency Medical System and Follow-up Appointments) Ensure handout has been given and documented.   Ensure PT/OT and/or SLP ordered  Problem: Every Day - Stroke  Goal: Core/Quality measure  requirements - Daily  Outcome: Progressing  Flowsheets (Taken 09/09/2022 0251)  Core/Quality measure requirements - Daily:   VTE Prevention: Ensure anticoagulant(s) administered and/or anti-embolism stockings/devices documented by end of day 2   Ensure antithrombotic administered or contraindication documented by LIP by end of day 2   Once lipid panel has resulted, check LDL. Contact provider for statin order if LDL > 70 (or ensure contraindication documented by LIP).   Continue stroke education (must include Modifiable Risk Factors, Warning Signs and Symptoms of Stroke, Activation of Emergency Medical System and Follow-up Appointments). Ensure handout has been given and documented.     Problem: Every Day - Stroke  Goal: Neurological status is stable or improving  Outcome: Progressing  Flowsheets (Taken 09/09/2022 0251)  Neurological status is stable or improving:   Monitor/assess/document neurological assessment (Stroke: every 4 hours)   Re-assess NIH Stroke Scale for any change in status   Perform CAM Assessment   Monitor/assess NIH Stroke Scale   Observe for seizure activity and initiate seizure precautions if indicated     Problem: Every Day - Stroke  Goal: Stable vital signs and fluid balance  Outcome: Progressing  Flowsheets (Taken 09/09/2022 0251)  Stable vital signs and fluid balance:   Position patient for maximum circulation/cardiac output   Monitor and assess vitals every 4 hours or as ordered and hemodynamic parameters   Monitor intake and output. Notify LIP if urine output is < 30 mL/hour.   Apply telemetry monitor as ordered   Encourage oral fluid intake     Problem: Every Day - Stroke  Goal: Patient will maintain adequate oxygenation  Outcome: Progressing  Flowsheets (Taken 09/09/2022 0251)  Patient will maintain adequate oxygenation:   Suction secretions as needed   Sleep Apnea: Encourage patient to speak to PCP regarding sleep apnea/sleep study ([place] only)   Maintain SpO2 of greater than 92%   Sleep  Apnea: Assess if previously tested or on CPAP     Problem: Every Day - Stroke  Goal: Patient's risk of aspiration will be minimized  Outcome: Progressing  Flowsheets (Taken 09/09/2022 0251)  Patient's risk of aspiration will be minimized:   Complete new dysphagia screen for any change in status: Keep patient NPO if patient fails   Place swallow precaution signage above bed   Monitor/assess for signs of aspiration (tachypnea, cough, wheezing, clearing throat, hoarseness after eating, decrease in SaO2   Order modified texture diet as recommend by Speech Pathologist   Assess and monitor ability to swallow   Place patient up in chair to eat, if possible   Instruct patient to take small single sips of liquid   Supervise patient during oral intake   Keep head of bed up a minimum of 30 degrees when hemodynamically stable   HOB up 90 degrees to eat if unable to be OOB   Do not use a straw   Consult/Collaborate with Speech Pathologist for dysphagia   Instruct patient to take small bites   Thicken liquids as recommended by Speech Pathologist     Problem: Every Day - Stroke  Goal: Nutritional intake is adequate  Outcome: Progressing  Flowsheets (Taken 09/09/2022 0251)  Nutritional intake is adequate:   Consult/collaborate with Clinical Nutritionist   Consult/collaborate with Speech Therapy (swallow evaluations)     Problem: Every Day - Stroke  Goal: Mobility/Activity is maintained at optimal level for patient  Outcome: Progressing  Flowsheets (Taken 09/09/2022 0251)  Mobility/activity is  maintained at optimal level for patient:   Encourage independent activity per ability   Consult/collaborate with Physical Therapy and/or Occupational Therapy     Problem: Every Day - Stroke  Goal: Skin integrity is maintained or improved  Outcome: Progressing  Flowsheets (Taken 09/09/2022 0251)  Skin integrity is maintained or improved:   Assess Braden Scale every shift   Increase activity as tolerated/progressive mobility   Keep skin clean and  dry   Collaborate with Wound, Ostomy, and Continence Nurse   Keep head of bed 30 degrees or less (unless contraindicated)   Encourage use of lotion/moisturizer on skin   Turn or reposition patient every 2 hours or as needed unless able to reposition self   Relieve pressure to bony prominences   Avoid shearing   Monitor patient's hygiene practices   Utilize specialty bed     Problem: Every Day - Stroke  Goal: Neurovascular status is stable or improving  Outcome: Progressing  Flowsheets (Taken 09/09/2022 0251)  Neurovascular status is stable or improving:   Monitor/assess neurovascular status (pulses, capillary refill, pain, paresthesia, presence of edema)   Monitor/assess for signs of Venous Thrombus Emboli (edema of calf/thigh redness, pain)   Monitor/assess site of invasive procedure for signs of bleeding     Problem: Every Day - Stroke  Goal: Effective coping demonstrated  Outcome: Progressing  Flowsheets (Taken 09/09/2022 0251)  Effective coping demonstrated:   Assess/report to LIP uncontrolled anxiety, depression, or ineffective coping   Offer reassurance to decrease anxiety     Problem: Every Day - Stroke  Goal: Will be able to express needs and understand communication  Outcome: Progressing  Flowsheets (Taken 09/09/2022 0251)  Able to express needs and understand communication:   Provide alternative method of communication if needed   Consult/collaborate with Case Management/Social Work   Patient/patient care companion demonstrates understanding on disease process, treatment plan, medications and discharge plan   Consult/collaborate with Speech Language Pathology (SLP)   Include patient care companion in decisions related to communication     Problem: Day of Discharge - Stroke  Goal: Able to express understanding of discharge instructions and stroke education  Outcome: Progressing  Flowsheets (Taken 09/09/2022 0251)  Able to express understanding of discharge instructions and stroke education:   Provide  alternative method of communication if needed   Patient/patient care companion demonstrates understanding on disease process, treatment plan, medications and discharge plan   Include patient care companion in decisions related to communication   Ensure "Discharge instructions: Stroke" appears on discharge paperwork  (After Visit Summary-AVS)   Consult/collaborate with Case Management/Social Work     Problem: Day of Discharge - Stroke  Goal: Core/Quality measures - Discharge  Outcome: Progressing  Flowsheets (Taken 09/09/2022 0251)  Core/Quality measures - Discharge:   Document discharge NIH Stroke Scale   Ensure antithrombotic ordered or contraindication documented by LIP   If diagnosis or history of A-fib/A-flutter, ensure anticoagulation ordered or contraindication documented by LIP   Complete final verification medications: check and compare prior to admit (PTA), during hospitalization and discharge medication list   Document discharge stroke education completed and patient/family verbalizes understanding   Document Modified Rankin Scale ([place] only)   Ensure statin ordered or contraindication documented by LIP

## 2022-09-09 NOTE — Nursing Progress Note (Signed)
NIH handoff with Jocelyn RN - NIH 4.

## 2022-09-09 NOTE — ED Notes (Signed)
Responded to code stroke along with ED charge RN. Patient developed R sided arm and leg drift that was not present on Previous NIH charting. MD arrived at bedside when patient was being taken to CT. Only CT non-con ordered. Patient has MRI scheduled later today.

## 2022-09-09 NOTE — Consults (Addendum)
Telestroke Acute Stroke Alert Note (Telephone-only Assessment)       We were consulted for stroke evaluation by Herbert Moors, MD who is caring for the patient at [facility] and case was discussed briefly.        Telephone Call Time [phone] 6/29      History of Present Illness:  [name] Cook is a 47 y.o. female with history of HTN, HLD, prior stroke without residual deficits who presents with R sided weakness.  Her last known well time was morning of 6/28     Patient presented to ED 6/29 with R facial droop. Present on waking 6/28, went to work but persisted to presented to ED. Admitted to medicine, on waking morning of 6/29 she had R arm weakness, which was new. New Sx noted at 0745. Stroke alert called. Per IM pt with R facial droop and R sided weakness, arm>leg, but no aphasia or gaze deviation. Notably pt with CT contrast allergy, unable to obtain CTA/CTP       Initial Vital Signs:        BP:(!) 252/134 (09/08/22 2206)       Pulse:69 (09/08/22 2206)        Resp:16 (09/08/22 2306)       Temp:98.6 F (37 C) (09/08/22 2206)       SpO2:94 % (09/08/22 2206)    Imaging personally reviewed? Yes          CT (Non-Contrast) Wet Read Non-Contrast CT Wet Read: Yes (09/09/22 0815) No acute intracranial abnormality. No significatn change from MRI on 6/28     CTA Wet Read CTA/MRA  wet read: No CTA/MRA (09/09/22 0815)       Impression/recommendations  47 y.o. female with history of HTN, HLD, prior stroke without residual deficits who initially presented with R facial weakness since 6/28 with subsequent worsening of R sided weakness 6/29.  Repeat CTH with no acute abnormalities.   Clinically seems consistent with lacunar infarct with subsequent progression. Worsening is >24 hours from LKW, not a candidate for intervention. Unable to obtain CTA d/t contrast allergy, but no LVO signs, not likely LVO.    - MRI Brain WO Contrast  - MRA head/neck W/WO con  - BP goal <220/110 for now; ultimate goal normotension        -  Continue Plavix   - start Aspirin 81mg  (Baby ASA) or Rectal aspirin 300mg   - Statin with goal LDL goal <70  - Obtain lipid profile and hemoglobin A1c  - Trans-thoracic echocardiogram WITHOUT bubble  - Neurology consult  - Rest of care per primary    Plan for transfer to [facility]: no    Thank you for allowing [place] to participate in the care of [name] Cook.  Case was also discussed with Dr. Jamey Ripa, Gerarda Gunther, MD. Please contact us with any questions at any time.    Angelita Ingles, MD, 09/09/2022 9:10 AM  Vascular Neurology, [facility]    Time on encounter (including reviewing chart, reviewing images, reviewing labs, discussing case w/ outside hospital provider, documentation): 32 minutes

## 2022-09-09 NOTE — ED Notes (Signed)
Provider at bedside

## 2022-09-09 NOTE — PT Eval Note (Signed)
Physical Therapy Evaluation  [name] Cook      Post Acute Care Therapy Recommendations:     Discharge Recommendations:  SNF      If SNF  recommended discharge disposition is not available, patient will need Min/Mod assist for ambulation and Home with supervision/HHPT with medical transport.     DME needs IF patient is discharging home: Front wheel walker, [facility]    Therapy discharge recommendations may change with patient status.  Please refer to most recent note for up-to-date recommendations.           [facility]  [address]  [place], [place] [address]  [phone]    Physical Therapy Evaluation    Patient: [name] Cook MRN: [id number]   Unit: [facility] Bed: E 29/E29    Time of Treatment:   PT Received On: 09/09/22  Start Time: 1410  Stop Time: 1430  Time Calculation (min): 20 min       Consult received for [name] Cook for PT evaluation and treatment.  Patient's medical condition is appropriate for Physical Therapy  intervention at this time.    Interpreter utilized: no, not indicated      Assessment   Christeena Schreiber is a 47 y.o. female admitted 09/08/2022.  Pt admitted 2/2 R facial droop, with R sided weakness morning of 09/09/22. Per chart review, pt hx of same symptoms prior. PMHx of hypertension and hyperlipidemia and history of CVA with no residual deficit. Pt rep[orts living with spouse and 72 y/o child in a house with 30 STE with rails and FOS to bed/bath. Pt reports I without AD at baseline, denies SPC/FWW use. Pt demonstrates decreased functional mobility and R sided weakness/decreased sensation to light touch. Pt requiring SBA for bed mobility, CGA STS, and Min HHA 4' with HHA- limited by PT reports of dizziness and fear of RLE buckling.       Pt's functional mobility is impacted by:  decreased activity tolerance, decreased bed mobility, abnormal blood pressure, dizziness/vertigo, gait impairment, sensation deficits, decreased strength, asymmetry, and transfers .  There  are a few comorbidities or other factors that affect plan of care and require modification of task including: anxiety, residual neurological symptoms, vertigo/dizziness, has stairs to manage, home alone for a portion of the day, and work demands.  Standardized tests and exams incorporated into evaluation include AMPAC mobility, ROM , and Strength.  Pt demonstrates a evolving clinical presentation.   Pt would continue to benefit from PT to address these deficits and increase functional independence.     Complexity Level Hx and Co  morbidites Examination Clinical Decision Making Clinical Presentation   Low  no impact 1-2 elements Limited options Stable       PMP - Progressive Mobility Protocol   PMP Activity: Step 6 - Walks in Room  Distance Walked (ft) (Step 6,7): 4 Feet       Rehabilitation Potential:   Prognosis: Good;With continued PT status post acute discharge    Interdisciplinary Communication: RN      Plan     Plan  Risks/Benefits/POC Discussed with Pt/Family: With patient  Treatment/Interventions: Exercise;Gait training;Stair training;Neuromuscular re-education;Functional transfer training;LE strengthening/ROM;Endurance training;Patient/family training;Equipment eval/education;Bed mobility  PT Frequency: 4-5x/wk         Medical Diagnosis: Facial droop [R29.810]    History of Present Illness: [name] Cook is a 47 y.o. female admitted on  09/08/2022 with R side facial droop, R side weakness        Patient Active Problem  List   Diagnosis    Seizures    Facial droop    Tension type headache    History of stroke    Morbid obesity with BMI of 45.0-49.9, adult    Hematemesis    Blood loss anemia    HLD (hyperlipidemia)    Paresthesia of right arm and leg    Hemorrhoids    Right hemiparesis    Nonspecific abnormal electroencephalogram (EEG)    Headache    Abdominal pain    Cellulitis of other specified site    Wound dehiscence    History of seizure disorder    HTN (hypertension)    Right sided weakness       Past  Medical History:   Diagnosis Date    Hyperlipidemia     Hypertension     Migraine     Morbid obesity     Palpitations     loop recorder placed in 2019, had to be removed after a month due to misplacement and keloid formation per patient    Seizures     Stroke     2015    Thoracic aortic aneurysm     TIA (transient ischemic attack) 2017       Past Surgical History:   Procedure Laterality Date    APPENDECTOMY (OPEN)      CHOLECYSTECTOMY      EGD, BIOPSY N/A 04/16/2017    Procedure: EGD, BIOPSY;  Surgeon: Pershing Proud, MD;  Location: [facility];  Service: Gastroenterology;  Laterality: N/A;    HYSTERECTOMY         Tests/Labs:  Lab Results   Component Value Date/Time    HGB 13.4 09/09/2022 04:25 AM    HGB 14.4 07/19/2022 06:54 PM    HCT 41.1 09/09/2022 04:25 AM    HCT 44.9 (H) 07/19/2022 06:54 PM    K 3.5 09/09/2022 04:25 AM    K 4.3 07/21/2022 04:35 AM    NA 143 09/09/2022 04:25 AM    NA 137 07/21/2022 04:35 AM    INR 1.1 03/14/2021 05:52 AM    TROPI 3.9 09/09/2022 09:01 AM    TROPI 4.2 09/09/2022 01:12 AM    TROPI 3.9 09/08/2022 11:23 PM    TROPI <2.7 07/19/2022 06:54 PM    TROPI <2.7 07/01/2022 10:44 AM    TROPI <2.7 07/01/2022 02:19 AM    TROPI 2.9 06/30/2022 09:55 PM         Imaging   MRI Brain WO Contrast    Result Date: 09/09/2022   1.No acute intracranial abnormality is detected. Otho Ket, DO 09/09/2022 11:32 AM    CT Head without Contrast    Result Date: 09/09/2022   No evidence of acute intracranial abnormality. [name], MD 09/09/2022 8:05 AM    XR Chest AP Portable    Result Date: 09/08/2022   No acute intrathoracic abnormality. Campbell Stall 09/08/2022 10:52 PM    CT Head WO Contrast    Result Date: 09/08/2022   1.Normal examination. [name], MD 09/08/2022 10:33 PM     Social History: (Per family/care provider, if patient is not able to give accurate report)  Lives with spouse and 18 y/o child in a two-level house.  Entry Steps: 30   Rails: yes Inside steps: FOS  Rails: yes  Equipment at  home:  No equipment needs  Prior Level of Function:     Cognition: Intact    Mobility/Locomotion: I no AD   Feeding: I   Grooming:  I   Bathing: I   Dressing: I   Toileting: I    Subjective   Pt reports continued chest pain and RN aware as well as continued R side weakness  Patient is agreeable to participation in the therapy session. Nursing clears patient for therapy.  Patient's Goal:  To get better  Pain: 8/10  Location: Chest  Therapist Intervention: positioned for comfort and premedicated for pain by RN  Patient is satisfied with therapist intervention.    Objective     Precautions/ Contraindications:   Precautions  Weight Bearing Status: no restrictions  Other Precautions: fall risk    Patient is in bed with telemetry, intravenous access, O2 at 2 liters/minute via NC , and blood pressure cuff in place.       Observation of patient/vitals:    PrePT- semi-supine: BP 140/79, HR 60, SpO2 93% on 2L  PostPT- Sitting: BP 162/80, HR 58, SpO2 96% on RA      Vitals:    09/09/22 0720 09/09/22 0745 09/09/22 0843 09/09/22 1202   BP: 112/65 112/75 112/75 129/72   Pulse: 62 67 67 61   Resp: 17 15 15 17    Temp: 97.9 F (36.6 C) 97.5 F (36.4 C) 97.5 F (36.4 C) 97.4 F (36.3 C)   TempSrc: Oral Oral Oral Oral   SpO2: 91% 92% 92% 98%   Weight:       Height:           Orientation/Cognition:  Alert and Oriented x 4  Cognition: Intact      Musculoskeletal Examination:      ROM Strength   Neck/ Trunk WFL DNT   RUE WFL Grossly 4-/5- slight drift noted   LUE WFL 5/5   RLE WFL Grossly 4-/5; slight drift/increased effort noted   LLE WFL 5/5     Sensation: decreased to light touch R side (RUE/RLE, R face)  Coordination: Intact    Functional Mobility:  Rolling: Mod I    Supine to sit: SBA  Scooting: Mod I in bed/to HOB  Sit to Supine: SBA  Sit to stand: CGA with bedrail  Stand to sit: CGA  Transfers: DNT  W/C Mobility: NA  Ambulation:     Weightbearing: No restrictions   Assistance level: Min HHA   Distance: 4'   Assistive Device:  HHA   Gait Deviations: decreased RLE stance' pt fear of RLE buckling; limited by subjective dizziness   Stairs: DNT    Balance:  Static Sit: Mod I  Dynamic Sit: Mod I  Static Stand: CGA  Dynamic Stand: Min A    Endurance: Fair- limited by dizziness    Participation:  Good    Education:  Educated the patient to role of physical therapy, plan of care, goals  of therapy, rationale for progressing mobility and HEP, safety with mobility and ADLs, and home safety.    RN notified of session outcome and that patient was left in bed with all needs met and equipment intact.   Safety measures include: handoff to nurse/clin tech/ unit secretary completed, oriented to call bell and placed within reach, personal items within reach, assistive device positioned out of reach, and bed placed in lowest position.   Mobility and ADL status posted at bedside and within E.M.R.    AM-PACT Inpatient Short Forms  Inpatient AM-PACT Performed? (PT): Basic Mobility Inpatient Short Form   AM-PACT "6 Clicks" Basic Mobility Inpatient Short Form  Turning Over in Bed: A little  Sitting Down On/Standing  From Armchair: A little  Lying on Back to Sitting on Side of Bed: A little  Assist Moving to/from Bed to Chair: A little  Assist to Walk in Hospital Room: A little  Assist to Climb 3-5 Steps with Railing: A lot  PT Basic Mobility Raw Score: 17  CMS 0-100% Score: 50.57%      Goals  Goal Formulation: With patient  Time for Goal Acheivement: 5 visits  Goals: Select goal  Pt Will Go Supine To Sit: independent  Pt Will Perform Sit to Stand: independent  Pt Will Ambulate: 151-200 feet;modified independent  Pt Will Go Up / Down Stairs: 1 flight;With rail;with stand by assist  Pt Will Perform Home Exer Program: independent       Modified Rankin Scale:      I am certified in mRS: Yes  mRS Assessment Source: Patient  mRS Value: 4 - Moderately severe disability    Therapist PPE during session gloves     Signature:  Nash Mantis, PT  09/09/2022  2:47  PM       (For scheduling questions, please contact rehab tech x [phone])

## 2022-09-09 NOTE — Plan of Care (Signed)
Problem: Pain interferes with ability to perform ADL  Goal: Pain at adequate level as identified by patient  Outcome: Progressing  Flowsheets (Taken 09/09/2022 2037)  Pain at adequate level as identified by patient:   Identify patient comfort function goal   Assess for risk of opioid induced respiratory depression, including snoring/sleep apnea. Alert healthcare team of risk factors identified.   Assess pain on admission, during daily assessment and/or before any "as needed" intervention(s)   Reassess pain within 30-60 minutes of any procedure/intervention, per Pain Assessment, Intervention, Reassessment (AIR) Cycle   Evaluate if patient comfort function goal is met   Evaluate patient's satisfaction with pain management progress   Offer non-pharmacological pain management interventions   Include patient/patient care companion in decisions related to pain management as needed   Consult/collaborate with Physical Therapy, Occupational Therapy, and/or Speech Therapy     Problem: Side Effects from Pain Analgesia  Goal: Patient will experience minimal side effects of analgesic therapy  Outcome: Progressing  Flowsheets (Taken 09/09/2022 2037)  Patient will experience minimal side effects of analgesic therapy:   Monitor/assess patient's respiratory status (RR depth, effort, breath sounds)   Assess for changes in cognitive function   Prevent/manage side effects per LIP orders (i.e. nausea, vomiting, pruritus, constipation, urinary retention, etc.)   Evaluate for opioid-induced sedation with appropriate assessment tool (i.e. POSS)     Problem: Safety  Goal: Patient will be free from injury during hospitalization  Outcome: Progressing  Flowsheets (Taken 09/09/2022 2037)  Patient will be free from injury during hospitalization:   Assess patient's risk for falls and implement fall prevention plan of care per policy   Use appropriate transfer methods   Ensure appropriate safety devices are available at the bedside   Provide and  maintain safe environment   Hourly rounding   Include patient/ family/ care giver in decisions related to safety   Assess for patients risk for elopement and implement Elopement Risk Plan per policy   Provide alternative method of communication if needed (communication boards, writing)  Goal: Patient will be free from infection during hospitalization  Outcome: Progressing  Flowsheets (Taken 09/09/2022 2037)  Free from Infection during hospitalization:   Assess and monitor for signs and symptoms of infection   Monitor all insertion sites (i.e. indwelling lines, tubes, urinary catheters, and drains)   Encourage patient and family to use good hand hygiene technique   Monitor lab/diagnostic results     Problem: Every Day - Stroke  Goal: Core/Quality measure requirements - Daily  Outcome: Progressing  Flowsheets (Taken 09/09/2022 2037)  Core/Quality measure requirements - Daily:   VTE Prevention: Ensure anticoagulant(s) administered and/or anti-embolism stockings/devices documented by end of day 2   Ensure antithrombotic administered or contraindication documented by LIP by end of day 2   Once lipid panel has resulted, check LDL. Contact provider for statin order if LDL > 70 (or ensure contraindication documented by LIP).   Continue stroke education (must include Modifiable Risk Factors, Warning Signs and Symptoms of Stroke, Activation of Emergency Medical System and Follow-up Appointments). Ensure handout has been given and documented.  Goal: Neurological status is stable or improving  Outcome: Progressing  Flowsheets (Taken 09/09/2022 2037)  Neurological status is stable or improving:   Monitor/assess/document neurological assessment (Stroke: every 4 hours)   Monitor/assess NIH Stroke Scale   Observe for seizure activity and initiate seizure precautions if indicated   Re-assess NIH Stroke Scale for any change in status   Perform CAM Assessment  Goal: Stable vital  signs and fluid balance  Outcome: Progressing  Flowsheets  (Taken 09/09/2022 2037)  Stable vital signs and fluid balance:   Position patient for maximum circulation/cardiac output   Monitor and assess vitals every 4 hours or as ordered and hemodynamic parameters   Monitor intake and output. Notify LIP if urine output is < 30 mL/hour.   Apply telemetry monitor as ordered   Encourage oral fluid intake  Goal: Patient will maintain adequate oxygenation  Outcome: Progressing  Flowsheets (Taken 09/09/2022 2037)  Patient will maintain adequate oxygenation: Maintain SpO2 of greater than 92%  Goal: Patient's risk of aspiration will be minimized  Outcome: Progressing  Flowsheets (Taken 09/09/2022 2037)  Patient's risk of aspiration will be minimized:   Monitor/assess for signs of aspiration (tachypnea, cough, wheezing, clearing throat, hoarseness after eating, decrease in SaO2   Assess and monitor ability to swallow   Keep head of bed up a minimum of 30 degrees when hemodynamically stable   Place patient up in chair to eat, if possible   Instruct patient to take small single sips of liquid   HOB up 90 degrees to eat if unable to be OOB   Instruct patient to take small bites   Supervise patient during oral intake   Do not use a straw  Goal: Nutritional intake is adequate  Outcome: Progressing  Goal: Mobility/Activity is maintained at optimal level for patient  Outcome: Progressing  Flowsheets (Taken 09/09/2022 2037)  Mobility/activity is maintained at optimal level for patient: Encourage independent activity per ability  Goal: Skin integrity is maintained or improved  Outcome: Progressing  Flowsheets (Taken 09/09/2022 2037)  Skin integrity is maintained or improved:   Assess Braden Scale every shift   Turn or reposition patient every 2 hours or as needed unless able to reposition self   Relieve pressure to bony prominences   Increase activity as tolerated/progressive mobility   Encourage use of lotion/moisturizer on skin   Keep skin clean and dry   Keep head of bed 30 degrees or less  (unless contraindicated)   Monitor patient's hygiene practices  Goal: Neurovascular status is stable or improving  Outcome: Progressing  Flowsheets (Taken 09/09/2022 2037)  Neurovascular status is stable or improving:   Monitor/assess neurovascular status (pulses, capillary refill, pain, paresthesia, presence of edema)   Monitor/assess for signs of Venous Thrombus Emboli (edema of calf/thigh redness, pain)   Monitor/assess site of invasive procedure for signs of bleeding  Goal: Effective coping demonstrated  Outcome: Progressing  Flowsheets (Taken 09/09/2022 2037)  Effective coping demonstrated: Offer reassurance to decrease anxiety  Goal: Will be able to express needs and understand communication  Outcome: Progressing  Flowsheets (Taken 09/09/2022 2037)  Able to express needs and understand communication:   Provide alternative method of communication if needed   Include patient care companion in decisions related to communication   Patient/patient care companion demonstrates understanding on disease process, treatment plan, medications and discharge plan

## 2022-09-09 NOTE — Progress Notes (Signed)
Telestroke Acute Stroke Alert Note (Telephone-only Assessment)       We were consulted for stroke evaluation by No att. providers found who is caring for the patient at [facility] and case was discussed briefly.     CC:  Facial droop    HPI:  Ms. Siracusa is a 47 y.o. female with past medical history of hypertension and hyperlipidemia and obesity and stroke who presents with chest pain and right facial droop.  BP 252/134.  She woke up this morning with sensation pulling on her right side of the face.  Patient with history of stroke in the past.    LKW/SD: 9:30 PM    Current NIHSS per ED: 3  mRS:    - HCT (personally reviewed) with no acute intracranial abnormality    ROS:  All other systems reviewed and negative except as noted in HPI       BP:(!) 252/134 (09/08/22 2206)       Pulse:69 (09/08/22 2206)        Resp:16 (09/08/22 2306)       Temp:98.6 F (37 C) (09/08/22 2206)       SpO2:94 % (09/08/22 2206)    Assessment & Plan:  Strokelike symptoms  Hypertensive urgency  Hypertension  Hyperlipidemia  History of stroke      - Admit with tele  - BP goal <220/110 for now; ultimate goal normotension  - recommend ASA 81 and statin (LDL goal <70)  - brain MRI wo; rest of stroke wu pending MRI result  - toxic metabolic wu; Utox, Lipid profile, A1C  - Neurology consult, if needed  - balance of care per primary    Plan for transfer to [facility]: no      Thank you for allowing [place] to participate in the care of Antje Raffo.  Case was also discussed with Dr. No att. providers found, who agrees with plan above. Please contact us with any questions at any time.    Time on encounter (including reviewing chart, reviewing images, reviewing labs, discussing case w/ outside hospital provider, documentation): 24 minutes     Jessy Oto, MD  Vascular Neurology Attending, [facility]          [phone]

## 2022-09-09 NOTE — ED Provider Notes (Signed)
EMERGENCY DEPARTMENT NOTE     Patient initially seen and examined at   ED PHYSICIAN ASSIGNED       Date/Time Event User Comments    09/08/22 2212 Physician Assigned Charlcie Cradle, MD assigned as Attending           ED MIDLEVEL (APP) ASSIGNED       None            HISTORY OF PRESENT ILLNESS   {Translator Used (Optional):59393}    Chief Complaint: Numbness, Chest Pain, and Extremity Weakness       47 y.o. female with past medical history as below ***    Independent Historian (other than patient): {Historian:59024}  Additional History Provided by Independent Historian:  MEDICAL HISTORY     Past Medical History:  Past Medical History:   Diagnosis Date    Hyperlipidemia     Hypertension     Migraine     Morbid obesity     Palpitations     loop recorder placed in 2019, had to be removed after a month due to misplacement and keloid formation per patient    Seizures     Stroke     2015    Thoracic aortic aneurysm     TIA (transient ischemic attack) 2017       Past Surgical History:  Past Surgical History:   Procedure Laterality Date    APPENDECTOMY (OPEN)      CHOLECYSTECTOMY      EGD, BIOPSY N/A 04/16/2017    Procedure: EGD, BIOPSY;  Surgeon: Pershing Proud, MD;  Location: [facility];  Service: Gastroenterology;  Laterality: N/A;    HYSTERECTOMY         Social History:  Social History     Socioeconomic History    Marital status: Married   Tobacco Use    Smoking status: Never    Smokeless tobacco: Never   Vaping Use    Vaping status: Never Used   Substance and Sexual Activity    Alcohol use: No    Drug use: No     Social Determinants of Health     Financial Resource Strain: Patient Declined (06/30/2022)    Overall Financial Resource Strain (CARDIA)     Difficulty of Paying Living Expenses: Patient declined   Food Insecurity: No Food Insecurity (07/19/2022)    Hunger Vital Sign     Worried About Running Out of Food in the Last Year: Never true     Ran Out of Food in the Last Year: Never true   Transportation  Needs: No Transportation Needs (07/19/2022)    PRAPARE - Therapist, art (Medical): No     Lack of Transportation (Non-Medical): No   Physical Activity: Insufficiently Active (06/30/2022)    Exercise Vital Sign     Days of Exercise per Week: 3 days     Minutes of Exercise per Session: 30 min   Stress: No Stress Concern Present (06/30/2022)    [employer] of Occupational Health - Occupational Stress Questionnaire     Feeling of Stress : Not at all   Social Connections: Socially Integrated (06/30/2022)    Social Connection and Isolation Panel [NHANES]     Frequency of Communication with Friends and Family: Three times a week     Frequency of Social Gatherings with Friends and Family: Once a week     Attends Religious Services: More than 4 times per year  Active Member of Clubs or Organizations: Yes     Attends Banker Meetings: More than 4 times per year     Marital Status: Married   Catering manager Violence: Not At Risk (07/19/2022)    Humiliation, Afraid, Rape, and Kick questionnaire     Fear of Current or Ex-Partner: No     Emotionally Abused: No     Physically Abused: No     Sexually Abused: No   Housing Stability: Low Risk  (07/19/2022)    Housing Stability Vital Sign     Unable to Pay for Housing in the Last Year: No     Number of Places Lived in the Last Year: 1     Unstable Housing in the Last Year: No       Family History:  Family History   Problem Relation Age of Onset    Myocardial Infarction Father 93    Deep vein thrombosis Father     No known problems Mother        Outpatient Medication:  Previous Medications    ALUM & MAG HYDROXIDE-SIMETHICONE (MAALOX PLUS) 200-200-20 MG/5 ML SUSPENSION    Take 30 mLs by mouth every 4 (four) hours as needed for Indigestion    ATORVASTATIN (LIPITOR) 40 MG TABLET    Take 1 tablet (40 mg total) by mouth daily    FAMOTIDINE (PEPCID) 20 MG TABLET    Take 1 tablet (20 mg) by mouth every 12 (twelve) hours    HYDRALAZINE (APRESOLINE) 25  MG TABLET    Take 1 tablet (25 mg) by mouth 3 (three) times daily    HYDROCHLOROTHIAZIDE (HYDRODIURIL) 25 MG TABLET    Take 1 tablet (25 mg) by mouth daily    LEVETIRACETAM (KEPPRA PO)    Take 500 mg by mouth 2 (two) times daily.       LISINOPRIL (ZESTRIL) 10 MG TABLET    Take 1 tablet (10 mg) by mouth daily    ONDANSETRON (ZOFRAN-ODT) 4 MG DISINTEGRATING TABLET    Take 1 tablet (4 mg) by mouth every 8 (eight) hours as needed for Nausea    VALACYCLOVIR HCL (VALTREX) 1000 MG TABLET    Take 1 tablet (1,000 mg) by mouth 3 (three) times daily         REVIEW OF SYSTEMS   Review of Systems See History of Present Illness  PHYSICAL EXAM     ED Triage Vitals   Enc Vitals Group      BP 09/08/22 2206 (!) 252/134      Heart Rate 09/08/22 2206 69      Resp Rate 09/08/22 2306 16      Temp 09/08/22 2206 98.6 F (37 C)      Temp Source 09/08/22 2206 Oral      SpO2 09/08/22 2206 94 %      Weight 09/08/22 2206 147 kg      Height 09/08/22 2206 1.778 m      Head Circumference --       Peak Flow --       Pain Score 09/08/22 2206 0      Pain Loc --       Pain Edu? --       Excl. in GC? --      Physical Exam   ***  MEDICAL DECISION MAKING     PRIMARY PROBLEM LIST      {CEP ACUITY:59028} DIAGNOSIS:***  {Chronic Illness Impacting Care of the above problem:59030} {Explain (Optional):59078}  {  Differential Diagnosis:59053}    DISCUSSION      ***    {If patient is being hospitalized is severe sepsis or septic shock suspected?:59467}      {Was management discussed with a consultant?:59037}  {Was the decision around the need for surgery discussed with consultant:59056::"N/A"}  {External Records Reviewed?:59023}    Additional Notes    {Diagnostic test considered and not performed:59031::"N/A"}  {Prescription medications considered and not given:59033::"N/A"}  {Hospitalization considered but not done:59032::"N/A"}  {Social Determinants of Health Considerations:59036::"N/A"}  {Was there decision to not resuscitate or to de-escalate care due to poor  prognosis?:59057}     NIH Stroke Score      Flowsheet Row Most Recent Value   NIH Stroke Scale    Interval Baseline admission to ED   1a. Level of Consciousness 0   1b. LOC Questions (age, month) 0   1c. LOC Commands (Open and close eyes, Grip AND release good hand) 0   2. Best Gaze 0   3. Visual Fields 0   4. Facial Palsy 2  [2pm today pt started her  lips move on side]   5a. Motor Left Arm: (Arms with palm down X 10 seconds. Sitting = arms at 90 degrees. Supine = arms 45 degrees) 0   5b. Motor Right Arm: (Arms with palm down X 10 seconds. Sitting = arms at 90 degrees Supine = arms 45 degrees) 0   Total Motor Arms 0   6a. Motor Left Leg: (Leg elevated X 5 seconds Supine = Leg 30 degrees) 0   6b. Motor Right Leg: (Leg elevated X 5 seconds Supine = Leg 30 degrees) 0   Total Motor Legs 0   7. Limb Ataxia (Finger to nose, heel to shin) 0   8. Sensory (Sensation or grimace to pin prick on face, arm, trunk, and leg) 1  [less sensation on the right side of face]   9. Best language (Describe picture, name items, read sentences from NIHSS booklet) 0   10. Dysarthria (Read list from NIHSS booklet) 0   11. Extinction and Inattention (formerly Neglect) - Test tactile and visual stimulation 0   NIHSS Total 3            Vital Signs: Reviewed the patient's vital signs.   Nursing Notes: Reviewed and utilized available nursing notes.  Medical Records Reviewed: Reviewed available past medical records.  Counseling: The emergency provider has spoken with the patient and discussed today's findings, in addition to providing specific details for the plan of care.  Questions are answered and there is agreement with the plan.      MIPS DOCUMENTATION    {ORAL ANTIBIOTICS given for otitis externa due to:59079}  {ACUTE BRONCHITIS Medical reasoning for giving this patient oral antibiotics for acute bronchitis are the following (Optional):59080}  {Medical reasoning for giving this patient oral antibiotics for acute sinusitis are the following  (Optional):59081}  {HEAD TRAUMA Indications for head CT due to trauma (Optional):59082}    CARDIAC STUDIES    The following cardiac studies were independently interpreted by me the Emergency Medicine Provider.  For full cardiac study results please see chart.    {Monitor Strip Interpretation:59688}  {Rate:59685}  {Rhythm:59687}  {ST segments:59689}    {EKG interpretation:59684}  {Comparison:59859}  {Time:64077}  {Rate:59685}  {Rhythm:59687}  {ST segments:59689}  {STEMI?:64073}  {EKG interpretation:59690}    {EKG interpretation:59684}  {Comparison:59859}  {Time:64077}  {Rate:59685}  {Rhythm:59687}  {ST segments:59689}  {STEMI?:64073}  {EKG interpretation:59690}  EMERGENCY IMAGING STUDIES  The following imagine studies were independently interpreted by me (emergency medicine provider):    {Xray interpreted by ED provider? (Optional):59468} {SIDE (Optional):59475}  {Comparison:59859}  {RESULT:59469}  {IMPRESSION:59470}    {CT interpreted by provider? (Optional):59471}  {Comparison:59859}  {RESULT:59473}  {IMPRESSION:59474}  RADIOLOGY IMAGING STUDIES      XR Chest AP Portable   Final Result          No acute intrathoracic abnormality.      Campbell Stall   09/08/2022 10:52 PM      CT Head WO Contrast   Final Result       1.Normal examination.      [name], MD   09/08/2022 10:33 PM          EMERGENCY DEPT. MEDICATIONS      ED Medication Orders (From admission, onward)      None            LABORATORY RESULTS    Ordered and independently interpreted AVAILABLE laboratory tests.   Results       Procedure Component Value Units Date/Time    Basic Metabolic Panel [[id number]] Collected: 09/08/22 2323    Specimen: Blood, Venous Updated: 09/08/22 2340    Hepatic Function Panel (LFT) [[id number]] Collected: 09/08/22 2323    Specimen: Blood, Venous Updated: 09/08/22 2340    Serum Beta HCG Qualitative [[id number]] Collected: 09/08/22 2323    Specimen: Blood, Venous Updated: 09/08/22 2340    Magnesium [[id number]] Collected:  09/08/22 2323    Specimen: Blood, Venous Updated: 09/08/22 2340    High Sensitivity Troponin-I at 0 hrs [[id number]] Collected: 09/08/22 2323    Specimen: Blood, Venous Updated: 09/08/22 2340    CBC with Differential [[id number]]  (Abnormal) Collected: 09/08/22 2257    Specimen: Blood, Venous Updated: 09/08/22 2328    Narrative:      The following orders were created for panel order CBC with Differential.  Procedure                               Abnormality         Status                     ---------                               -----------         ------                     CBC with Differential[[id number]]        Abnormal            Final result                 Please view results for these tests on the individual orders.    CBC with Differential [[id number]]  (Abnormal) Collected: 09/08/22 2257    Specimen: Blood, Venous Updated: 09/08/22 2328     WBC 9.69 x10 3/uL      Hemoglobin 13.7 g/dL      Hematocrit 35.5 %      Platelet Count 137 x10 3/uL      MPV 13.8 fL      RBC 4.43 x10 6/uL      MCV 93.9 fL      MCH 30.9 pg      MCHC 32.9 g/dL      RDW  13 %      nRBC % 0.0 /100 WBC      Absolute nRBC 0.00 x10 3/uL      Preliminary Absolute Neutrophil Count 5.81 x10 3/uL      Neutrophils % 60.0 %      Lymphocytes % 30.2 %      Monocytes % 7.3 %      Eosinophils % 1.9 %      Basophils % 0.4 %      Immature Granulocytes % 0.2 %      Absolute Neutrophils 5.81 x10 3/uL      Absolute Lymphocytes 2.93 x10 3/uL      Absolute Monocytes 0.71 x10 3/uL      Absolute Eosinophils 0.18 x10 3/uL      Absolute Basophils 0.04 x10 3/uL      Absolute Immature Granulocytes 0.02 x10 3/uL     Whole Blood Glucose POCT [[id number]]  (Normal) Collected: 09/08/22 2210    Specimen: Blood, Capillary Updated: 09/08/22 2212     Whole Blood Glucose POCT 83 mg/dL               CRITICAL CARE/PROCEDURES    Procedures  ***Critical care?  DIAGNOSIS      Diagnosis:  Final diagnoses:   None       Disposition:  ED Disposition       None             Prescriptions:  Patient's Medications   New Prescriptions    No medications on file   Previous Medications    ALUM & MAG HYDROXIDE-SIMETHICONE (MAALOX PLUS) 200-200-20 MG/5 ML SUSPENSION    Take 30 mLs by mouth every 4 (four) hours as needed for Indigestion    ATORVASTATIN (LIPITOR) 40 MG TABLET    Take 1 tablet (40 mg total) by mouth daily    FAMOTIDINE (PEPCID) 20 MG TABLET    Take 1 tablet (20 mg) by mouth every 12 (twelve) hours    HYDRALAZINE (APRESOLINE) 25 MG TABLET    Take 1 tablet (25 mg) by mouth 3 (three) times daily    HYDROCHLOROTHIAZIDE (HYDRODIURIL) 25 MG TABLET    Take 1 tablet (25 mg) by mouth daily    LEVETIRACETAM (KEPPRA PO)    Take 500 mg by mouth 2 (two) times daily.       LISINOPRIL (ZESTRIL) 10 MG TABLET    Take 1 tablet (10 mg) by mouth daily    ONDANSETRON (ZOFRAN-ODT) 4 MG DISINTEGRATING TABLET    Take 1 tablet (4 mg) by mouth every 8 (eight) hours as needed for Nausea    VALACYCLOVIR HCL (VALTREX) 1000 MG TABLET    Take 1 tablet (1,000 mg) by mouth 3 (three) times daily   Modified Medications    No medications on file   Discontinued Medications    No medications on file           This note was generated by the Epic EMR system/ Dragon speech recognition and may contain inherent errors or omissions not intended by the user. Grammatical errors, random word insertions, deletions and pronoun errors  are occasional consequences of this technology due to software limitations. Not all errors are caught or corrected. If there are questions or concerns about the content of this note or information contained within the body of this dictation they should be addressed directly with the author for clarification.    {THIS REVIEW SECTION WILL AUTODELETE ONCE NOTE IS SIGNED    END  REVIEW SECTION(Optional):55325}

## 2022-09-09 NOTE — ED Notes (Signed)
No call for stroke alert as per MD. Stroke  assessment and  dysphagia screen done

## 2022-09-09 NOTE — Consults (Addendum)
CARDIOLOGY HOSPITAL NOTE  Stephens Shire, MD   [facility]  Office phone:  [phone]     [facility] phone [phone]  [facility] phone [phone]    Date:  09/09/2022  1:18 PM    Patient:  [name] Cook.  DOB: 1975-10-30.  47 y.o.  female , MRN [id number]   Attending:  Herbert Moors, MD   Admission date:  09/08/2022     ASSESSMENT & PLAN:     47 y.o. female with h/o HTN presents with worsening R sided weakness and chest tightness. No evidence of ACS by ECG or enzymes. Echocardiogram with normal LV and valvular function     Chest pressure: Atypical. Await neurologic investigations. Outpatient stress test. Continue statin    HTN: Continue HCT and lisinopril    Please call if further questions    HISTORY OF PRESENT ILLNESS:     47 y.o. female presents with R sided weakness and facial droop. Chest pressure since last Pm. Not pleuritic, positional or exertional. Occurs in central chest and has sharp pain radiating to L shoulder. Not exertional.Ongoing for almost 24 hours    REVIEW OF SYSTEMS:     + R arm weakness    PAST MEDICAL HISTORY:     Past Medical History:   Diagnosis Date    Hyperlipidemia     Hypertension     Migraine     Morbid obesity     Palpitations     loop recorder placed in 2019, had to be removed after a month due to misplacement and keloid formation per patient    Seizures     Stroke     2015    Thoracic aortic aneurysm     TIA (transient ischemic attack) 2017     Past Surgical History:   Procedure Laterality Date    APPENDECTOMY (OPEN)      CHOLECYSTECTOMY      EGD, BIOPSY N/A 04/16/2017    Procedure: EGD, BIOPSY;  Surgeon: Pershing Proud, MD;  Location: [facility];  Service: Gastroenterology;  Laterality: N/A;    HYSTERECTOMY         SOCIAL HISTORY:     Social History     Tobacco Use    Smoking status: Never    Smokeless tobacco: Never   Substance Use Topics    Alcohol use: No       FAMILY HISTORY:   family history includes Deep vein thrombosis in her  father; Myocardial Infarction (age of onset: 46) in her father; No known problems in her mother.    ALLERGIES:     Allergies   Allergen Reactions    Contrast [Iodinated Contrast Media] Anaphylaxis     Patient woke up in ICU after having CT with contrast, last remembers being in CT    Fentanyl Anaphylaxis     Throat swelling    Fioricet [Butalbital-Apap-Caffeine] Hives    Flexeril [Cyclobenzaprine] Edema     Causes throat swelling per Pt report    Motrin [Ibuprofen] Anaphylaxis     Throat swelling    Tramadol Anaphylaxis     Throat swelling    Bentyl [Dicyclomine] Edema    Percocet [Oxycodone-Acetaminophen] Hives    Shellfish-Derived Products Hives    Toradol [Ketorolac Tromethamine] Hives    Tylenol [Acetaminophen] Hives        MEDICATIONS:    INFUSION MEDS:      SCHEDULED MEDS:     Current Facility-Administered Medications   Medication  Dose Route Frequency    aspirin  81 mg Oral Daily    atorvastatin  40 mg Oral Daily    [START ON 09/10/2022] clopidogrel  75 mg Oral Daily    enoxaparin  40 mg Subcutaneous Daily    famotidine  20 mg Oral Q12H SCH    hydrALAZINE  25 mg Oral TID    hydroCHLOROthiazide  25 mg Oral Daily    levETIRAcetam  500 mg Oral BID    lisinopril  10 mg Oral Daily      PRN MEDS:   alum & mag hydroxide-simethicone, benzocaine-menthol, benzonatate, carboxymethylcellulose sodium, dextrose **OR** dextrose **OR** dextrose **OR** glucagon (rDNA), hydrALAZINE, HYDROmorphone, labetalol, magnesium sulfate, melatonin, naloxone, ondansetron, potassium & sodium phosphates, potassium chloride **AND** potassium chloride, saline     DIAGNOSTICS:     Telemetry independently reviewed by me shows sinus rhythm with PVCs    LABS:     Recent Labs   Lab 09/09/22  0425 09/08/22  2257   WBC 8.28 9.69*   Hemoglobin 13.4 13.7   Hematocrit 41.1 41.6   Platelet Count 125* 137*     Recent Labs   Lab 09/09/22  0425 09/08/22  2323   Sodium 143 143   Potassium 3.5 3.8   Chloride 108 109   CO2 26 24   BUN 11 13   Creatinine 0.8 0.9    GFR >60.0 >60.0   Glucose 120* 87   Calcium 8.7 9.1     Recent Labs   Lab 09/09/22  0901 09/09/22  0112 09/08/22  2323   hs Troponin 3.9 4.2 3.9            FLUID BALANCE AND WEIGHT:                                           Last filed weight: Weight: 147.1 kg (324 lb 4.8 oz)  Weight on Admission: Weight: 147 kg (324 lb 1.2 oz)  Net IO Since Admission: 250 mL [09/09/22 1318]     Intake/Output Summary (Last 24 hours) at 09/09/2022 0700  Last data filed at 09/09/2022 0124  Gross per 24 hour   Intake 50 ml   Output --   Net 50 ml   Weight change:     Last 3 Weights for the past 72 hrs (Last 3 readings):   Weight   09/09/22 0533 147.1 kg (324 lb 4.8 oz)   09/09/22 0124 147.1 kg (324 lb 4.8 oz)   09/08/22 2206 147 kg (324 lb 1.2 oz)        PHYSICAL EXAM:                                                                 BP 129/72   Pulse 61   Temp 97.4 F (36.3 C) (Oral)   Resp 17   Ht 1.778 m (5\' 10" )   Wt 147.1 kg (324 lb 4.8 oz)   LMP 10/15/2018 (Exact Date)   SpO2 98%   BMI 46.53 kg/m     Constitutional:      Alert, cooperative, well developed, well nourished.  No acute distress.   Skin:     +  tatoos.   Head/Eyes :    Normocephalic.  EOM intact.  Normal conjunctivae and lids.       Neck:   Carotid pulses full and equal bilaterally.  No carotid bruit. JVP to 5 cm.   Lungs:     No tenderness to palpation.  Normal respiratory effort.  Clear to auscultation bilaterally.  No rhonchi, rales, or wheezes.     Cardiac:   LV apical impulse not displaced.  Regular rhythm.   S1 and   S2 normal.  No gallop   Abdomen:     Soft, non-tender.  Bowel sounds present.     Extremities:   No clubbing, cyanosis, or edema.   Pulses:   Radial pulses are full and equal bilaterally.     Neurologic:   Alert O x 3.   Psychiatric:   Normal mood and affect.

## 2022-09-09 NOTE — Nursing Progress Note (Signed)
The learning abilities of the patient and/or caregiver have been assessed. Today's individualized plan of care includes MRI, q4 neuro checks, Stroke handbook, neuro consult, therapy consults. The patient or caregiver states the following personal goal related to the patient's deficit(s): "By discharge I want to be able to feel better" The plan of care was discussed with the patient and/or caregiver, who agrees to it and demonstrates understanding of the disease process, risk factors, treatment plan, medications and consequences of noncompliance. All questions and concerns were addressed.

## 2022-09-09 NOTE — Consults (Addendum)
NEUROLOGY CONSULTATION    Date Time: 09/09/22 12:25 PM  Patient Name: [name] Cook  Attending Physician: Herbert Moors, MD      Assessment & Plan:   Left-sided facial weakness and right arm and right leg weakness as well, pointing towards a brainstem CVA initial MRI is negative  Empiric aspirin PLAVIX   Check lipid panel A1c  Echo,  Will repeat MRI in 24 hours to look for progression, especially if it is a brainstem stroke sometimes can take a while for it to show up on the scans  MRA Head and Neck as well         History of Present Illness:   Neurology consultation requested by:--Dr Jamey Ripa   47 year old lady, with a history of morbid obesity, comes into the hospital evaluation left-sided facial weakness, and right arm and right leg weakness and numbness.  The symptoms have been ongoing for the last 2 days, patient seems somewhat worse this morning hence code stroke was called, patient has already undergone brain MRI, which is negative for acute ischemia on my review as well,        Addendum: Chart review done, patient was admitted last year, at [facility] with facial numbness right arm weakness right leg weakness and numbness, when asked the patient about her prior event she says that --that is not accurate history                   Past Medical History:     Past Medical History:   Diagnosis Date    Hyperlipidemia     Hypertension     Migraine     Morbid obesity     Palpitations     loop recorder placed in 2019, had to be removed after a month due to misplacement and keloid formation per patient    Seizures     Stroke     2015    Thoracic aortic aneurysm     TIA (transient ischemic attack) 2017       Meds:     aspirin Lipitor Plavix Lovenox Pepcid hydralazine hydrochlorothiazide Keppra Zestril      Allergies   Allergen Reactions    Contrast [Iodinated Contrast Media] Anaphylaxis     Patient woke up in ICU after having CT with contrast, last remembers being in CT    Fentanyl Anaphylaxis     Throat  swelling    Fioricet [Butalbital-Apap-Caffeine] Hives    Flexeril [Cyclobenzaprine] Edema     Causes throat swelling per Pt report    Motrin [Ibuprofen] Anaphylaxis     Throat swelling    Tramadol Anaphylaxis     Throat swelling    Bentyl [Dicyclomine] Edema    Percocet [Oxycodone-Acetaminophen] Hives    Shellfish-Derived Products Hives    Toradol [Ketorolac Tromethamine] Hives    Tylenol [Acetaminophen] Hives       Social & Family History:     Social History     Socioeconomic History    Marital status: Married   Tobacco Use    Smoking status: Never    Smokeless tobacco: Never   Vaping Use    Vaping status: Never Used   Substance and Sexual Activity    Alcohol use: No    Drug use: No     Social Determinants of Health     Financial Resource Strain: Low Risk  (09/09/2022)    Overall Financial Resource Strain (CARDIA)     Difficulty of Paying Living Expenses: Not hard at  all   Food Insecurity: No Food Insecurity (07/19/2022)    Hunger Vital Sign     Worried About Running Out of Food in the Last Year: Never true     Ran Out of Food in the Last Year: Never true   Transportation Needs: No Transportation Needs (07/19/2022)    PRAPARE - Therapist, art (Medical): No     Lack of Transportation (Non-Medical): No   Physical Activity: Sufficiently Active (09/09/2022)    Exercise Vital Sign     Days of Exercise per Week: 2 days     Minutes of Exercise per Session: 120 min   Recent Concern: Physical Activity - Insufficiently Active (06/30/2022)    Exercise Vital Sign     Days of Exercise per Week: 3 days     Minutes of Exercise per Session: 30 min   Stress: No Stress Concern Present (06/30/2022)    [employer] of Occupational Health - Occupational Stress Questionnaire     Feeling of Stress : Not at all   Social Connections: Socially Integrated (06/30/2022)    Social Connection and Isolation Panel [NHANES]     Frequency of Communication with Friends and Family: Three times a week     Frequency of Social  Gatherings with Friends and Family: Once a week     Attends Religious Services: More than 4 times per year     Active Member of [employer] or Organizations: Yes     Attends Engineer, structural: More than 4 times per year     Marital Status: Married   Catering manager Violence: Not At Risk (07/19/2022)    Humiliation, Afraid, Rape, and Kick questionnaire     Fear of Current or Ex-Partner: No     Emotionally Abused: No     Physically Abused: No     Sexually Abused: No   Housing Stability: Low Risk  (07/19/2022)    Housing Stability Vital Sign     Unable to Pay for Housing in the Last Year: No     Number of Places Lived in the Last Year: 1     Unstable Housing in the Last Year: No       Family History   Problem Relation Age of Onset    Myocardial Infarction Father 45    Deep vein thrombosis Father     No known problems Mother            CODE STATUS: FULL   I personally reviewed all of the medications.  Medication list generated using all available resources.  Elder abuse (physical)  - negative  Advanced care plan - reviewed from chart or in discussion with pt or family    Review of Systems:   No headache, eye, ear nose, throat problems; no coughing or wheezing or shortness of breath, No chest pain or orthopnea, no abdominal pain, nausea or vomiting, No pain in the body or extremities, no psychiatric, neurological, endocrine, hematological or cardiac complaints except as noted above.          Physical Exam:   Blood pressure 129/72, pulse 61, temperature 97.4 F (36.3 C), temperature source Oral, resp. rate 17, height 1.778 m (5\' 10" ), weight 147.1 kg (324 lb 4.8 oz), last menstrual period 10/15/2018, SpO2 98%.    HEENT: Normocephalic.no carotid bruits  Lungs:  CTA bil  Abd Soft   Cardiac:  S1,S2, normal rate and rhythm  Neck: supple, no cartoid bruits  Extremities: no edema  Skin: no rashes seen in exposed areas     Neuro:  Level of consciousness:  Alert and appropriate  Oriented:  X 3  Cognition:  Intact naming,  recognition, concentration and following complex commands  Cranial Nerves:   significant left-sided facial weakness, intranuclear in type, right arm and right leg slightly weaker with some drift and reduced sensation as well reflexes difficult to elicit, eye movements intact pupils equal reactive    Labs:     Recent Labs   Lab 09/09/22  0425 09/08/22  2323   Glucose 120* 87   BUN 11 13   Creatinine 0.8 0.9   Calcium 8.7 9.1   Sodium 143 143   Potassium 3.5 3.8   Chloride 108 109   CO2 26 24   Albumin 3.7 3.7   Magnesium  --  2.0   AST (SGOT) 22 21   ALT 24 23   Bilirubin, Total 1.0 0.8   Alkaline Phosphatase 110 108     Recent Labs   Lab 09/09/22  0425 09/08/22  2257   WBC 8.28 9.69*   Hemoglobin 13.4 13.7   Hematocrit 41.1 41.6   MCV 95.8 93.9   MCH 31.2 30.9   MCHC 32.6 32.9   Platelet Count 125* 137*         No results for input(s): "PTT", "PT", "INR" in the last 72 hours.       Radiology Results (24 Hour)       Procedure Component Value Units Date/Time    MRI Brain WO Contrast [[id number]] Collected: 09/09/22 1130    Order Status: Completed Updated: 09/09/22 1134    Narrative:      HISTORY: Right-sided facial weakness and hemiparesis.      COMPARISON: Head CT 09/09/2022 and MR 07/20/2022.     TECHNIQUE: MRI of the brain performed on a 1.5 Tesla scanner without  intravenous contrast.      CONTRAST: None.    FINDINGS:   A few punctate FLAIR hyperintensities in the white matter of the cerebral  hemispheres are stable, most notably the frontal lobes, nonspecific but can  be seen in the setting of migraine headaches. There is no diffusion  restriction to suggest an acute infarction. There is no mass effect,  midline shift or hydrocephalus. Flow voids of the major vessels are  maintained. The mastoid and paranasal sinus regions are clear.      Impression:         1.No acute intracranial abnormality is detected.    Otho Ket, DO  09/09/2022 11:32 AM    MRA Neck WO Contrast [[id number]] Resulted: 09/09/22 1056     Order Status: Sent Updated: 09/09/22 1125    MRA Head WO Contrast [[id number]] Resulted: 09/09/22 1056    Order Status: Sent Updated: 09/09/22 1123    CT Head without Contrast [[id number]] Collected: 09/09/22 0759    Order Status: Completed Updated: 09/09/22 0807    Narrative:      HISTORY: Right facial droop, concern for stroke.    COMPARISON: 09/08/2022.    TECHNIQUE: Non-contrast CT of the head was obtained. The following dose  reduction techniques were utilized: automated exposure control and/or  adjustment of the mA and/or kV according to patient's size, and the use of  iterative reconstruction technique.    FINDINGS:  There is no acute intracranial hemorrhage.  There is no evidence of acute territorial infarction. The white matter  attenuation is within normal limits.  There is no intracranial mass or  mass effect. There is no hydrocephalus.  No acute calvarial or skull base fracture is identified. There is no  destructive osseous lesion.  The imaged paranasal sinuses and mastoid cells are predominantly clear.      Impression:         No evidence of acute intracranial abnormality.    [name], MD  09/09/2022 8:05 AM    XR Chest AP Portable [[id number]] Collected: 09/08/22 2251    Order Status: Completed Updated: 09/08/22 2254    Narrative:      HISTORY: Chest pain     COMPARISON: 07/19/2022    FINDINGS:     Indwelling tubes and lines: None.    Lungs: Clear lungs.    Pleura: No significant pleural effusion. No appreciable pneumothorax.    Mediastinum: Unremarkable contours after accounting for technique.     Bones: No acute or aggressive osseous abnormality. Mild dextrothoracic  scoliosis.      Impression:           No acute intrathoracic abnormality.    Campbell Stall  09/08/2022 10:52 PM    CT Head WO Contrast [[id number]] Collected: 09/08/22 2230    Order Status: Completed Updated: 09/08/22 2235    Narrative:      HISTORY: Right facial droop.    COMPARISON: Correlation with a brain MRI dated Jul 20, 2022.      TECHNIQUE: CT of the head performed without intravenous contrast. The  following dose reduction techniques were utilized: automated exposure  control and/or adjustment of the mA and/or KV according to patient size,  and the use of an iterative reconstruction technique.    CONTRAST: None.    FINDINGS:  The ventricular system and cisterns are normally configured. There is no  mass or an acute intracranial hemorrhage. The visualized paranasal sinuses  appear well-aerated. The calvarium is intact.      Impression:         1.Normal examination.    [name], MD  09/08/2022 10:33 PM             All recent brain and spine imaging (MRI, CT) personally reviewed.    Chart reviewed    Case discussed with:  pt 75  minutes; >50% time spent in counseling or coordination of care        This note was generated by the Epic EMR system/Speech recognition and may contain inherent errors or omissions not intended by the user. Grammatical errors, random word insertions, deletions and pronoun errors  are occasional consequences of this technology due to software limitations.   Not all errors are caught or corrected. If there are questions or concerns about the content of this note or information contained within the body of this dictation they should be addressed directly with the author for clarification.    Signed by: Cathe Mons, MD, MD  Spectralink: [id number]      Answering Service: [phone]

## 2022-09-09 NOTE — UM Notes (Signed)
09/09/22 0049  Admit to Expedited Observation  Once        Diagnosis: Facial Droop  Level of Care: Level 4 (Acute)  Patient Class: Observation   Question Answer Comment   Admitting Physician Henderson Newcomer    Estimated Length of Stay < 2 midnights    Tentative Discharge Plan? Home or Self Care            47 y.o. female presents with Right-sided facial droop 1 day     PMH: hypertension and hyperlipidemia and history of CVA with no residual deficit     ED course: 1 blood pressure reading of 252/134 (likely erroneous) otherwise unremarkable vital signs.  WBC of 9.69.  Chemistry/UA unremarkable unremarkable.  CT head did not show any acute intracranial pathology.  Will admit for rule out CVA       6/29 Medicine plan:  -Admit to [facility] service     1.  Suspected CVA: Does have an obvious left-sided facial droop.  However she is fully alert oriented and neurologically intact. ?  Bell's palsy.  CT head is unremarkable.  Will do MRI neurology to see in AM.     2.  Hypertension: Fairly well-controlled except 1 outlier.  Will do permissive hypertension.     3.  Hyperlipidemia: Check lipid profile continue statin.     Leukocytosis: Mild, likely reactive.  Will monitor closely.     Nutrition:No diet orders on file     Code status: Full code      Status/Disposition:   Pt is admitted under OBSERVATION with above concerns.    Anticipated medical stability for discharge: 24 Hrs       6/29 Tele neurology consult:    Patient presented to ED 6/29 with R facial droop. Present on waking 6/28, went to work but persisted to presented to ED. Admitted to medicine, on waking morning of 6/29 she had R arm weakness, which was new. New Sx noted at 0745. Stroke alert called. Per IM pt with R facial droop and R sided weakness, arm>leg, but no aphasia or gaze deviation. Notably pt with CT contrast allergy, unable to obtain CTA/CTP        Initial Vital Signs:        BP:(!) 252/134 (09/08/22 2206)       Pulse:69 (09/08/22 2206)         Resp:16 (09/08/22 2306)       Temp:98.6 F (37 C) (09/08/22 2206)       SpO2:94 % (09/08/22 2206)    - MRI Brain WO Contrast  - MRA head/neck W/WO con  - BP goal <220/110 for now; ultimate goal normotension        - Continue Plavix   - start Aspirin 81mg  (Baby ASA) or Rectal aspirin 300mg   - Statin with goal LDL goal <70  - Obtain lipid profile and hemoglobin A1c  - Trans-thoracic echocardiogram WITHOUT bubble  - Neurology consult      6/29 Neurology consult:  Left-sided facial weakness and right arm and right leg weakness as well, pointing towards a brainstem CVA initial MRI is negative  Empiric aspirin PLAVIX   Check lipid panel A1c  Echo,  Will repeat MRI in 24 hours to look for progression, especially if it is a brainstem stroke sometimes can take a while for it to show up on the scans  MRA Head and Neck as well       6/29 Cardiology consult:  Chest pressure: Atypical. Await neurologic  investigations. Outpatient stress test. Continue statin     HTN: Continue HCT and lisinopril        BP 129/72   Pulse 61   Temp 97.4 F (36.3 C) (Oral)   Resp 17   Ht 1.778 m (5\' 10" )   Wt 147.1 kg (324 lb 4.8 oz)   LMP 10/15/2018 (Exact Date)   SpO2 98%   BMI 46.53 kg/m       MRI brain:  No acute intracranial abnormality is detected.        09/08/22 22:57 09/08/22 23:23 09/09/22 01:28 09/09/22 04:25   WBC 9.69 (H)   8.28   Platelet Count 137 (L)   125 (L)   MPV 13.8 (H)   13.9 (H)   Glucose  87  120 (H)   Globulin  3.9 (H)  4.0 (H)   Ketones UA   Trace !           Gillian Scarce, MSN, RN  [facility] Systems, Revenue Cycle Department, Utilization Review  Phone [phone]   Fax [phone]   [email or web address]

## 2022-09-09 NOTE — Progress Notes (Signed)
4 eyes in 4 hours pressure injury assessment note:      Completed with: Olguine, RN  Unit & Time admitted: Exp Obs @ 01:20am             Bony Prominences: Check appropriate box; if wound is present enter wound assessment in LDA     Occiput:                 [x] WNL  []  Wound present  Face:                     [x] WNL  []  Wound present  Ears:                      [x] WNL  []  Wound present  Spine:                    [x] WNL  []  Wound present  Shoulders:             [x] WNL  []  Wound present  Elbows:                  [x] WNL  []  Wound present  Sacrum/coccyx:     [x] WNL  []  Wound present  Ischial Tuberosity:  [x] WNL  []  Wound present  Trochanter/Hip:      [x] WNL  []  Wound present  Knees:                   [x] WNL  []  Wound present  Ankles:                   [x] WNL  []  Wound present  Heels:                    [x] WNL  []  Wound present  Other pressure areas:  []  Wound location       Device related: []  Device name:         LDA completed if wound present: yes/no  Consult WOCN if necessary    Other skin related issues, ie tears, rash, etc, document in Integumentary flowsheet

## 2022-09-09 NOTE — Progress Notes (Signed)
Patient was admitted to exp Obs room 29 at around  01:20am due to facial droop. Hand off report received from [place], [place]. Patient is AO x4 and able to ambulate to the restroom with strong steady gait. VSS on RA. Pt c/o chest pain with 7/10 on scale. Administered PRN Dilaudid per order and verbalized slight relief from pain. Pt was oriented to room. Pt belongings doc'd in Epic. Implemented fall safety precautions. Instructed to call for assistance if needed. Verbalized understanding. Plan of care continues.

## 2022-09-09 NOTE — Significant Event (Addendum)
09/09/22 0843   RRT Arrival and End Time   RRT Call Time 0745   RRT Arrival Time 0746   RRT End Time 0800   Reason for Rapid Response Activation   Reason for Rapid Response Activation Code Stroke   Rapid Response Vital Signs   Level of Consciousness Alert   Temp 97.5 F (36.4 C)   Temp Source Oral   Heart Rate 67   Resp Rate 15   BP 112/75   Patient Position HOB Elevated   SpO2 92 %   O2 Delivery Device Room air   Blood Glucose Meter 137   Glasgow Coma Scale   Eye Opening 4   Best Verbal Response 5   Best Motor Response 6   Glasgow Coma Scale Score 15   Consults/Notifications   Rapid Response Notifications/ Consults Non Medical Stroke Nurse   Physician Notification Attending Physician   Rapid Response Interventions   Rapid Response Interventions EKG  (CT head)   Rapid Response Outcome   Rapid Response Outcome Stayed in room   Cardiac   Cardiac Rhythm Sinus arrhythmia     Stroke Alert called at 0745. Primary RN during Initial NIH for shift scored a 4 (+2 From previous assessment). New symptoms included R Arm Drift and R Leg drift as well as decreased sensation on R face, R arm, and R leg - all new upon assessment.  LKW [id number]  during Primary RN assessment - pt stated "These new symptoms just started". Accucheck 137 - see vitals above. Head CT ordered - see result in chart. Attending provider and ER team at the bedside. Neuro Contacted. See post-code stroke orders placed in chart.

## 2022-09-09 NOTE — PT Progress Note (Signed)
[facility]  Physical Therapy Attempt Note    Patient:  [name] Cook MRN#:  [id number]    Unit:  [facility] EMERGENCY DEPARTMENT Room/Bed:  E 29/E29    PT Cancellation: Visit  PT Visit Cancellation Reason: Patient/caregiver declines therapy at this time (Pt reports she is too tired. Pt also reports continued chest pain. PT notified RN- PT to f/u in PM as schedule allows.)           Signature:   Nash Mantis, PT  09/09/2022  11:47 AM       (For scheduling questions, please contact rehab tech x [phone])

## 2022-09-09 NOTE — Nursing Progress Note (Signed)
Pt transported to MRI by NP and RN. Pt on portable monitor. PRN antianxiety agent given for MRI as ordered.

## 2022-09-09 NOTE — SLP Eval Note (Signed)
[facility]  [address]  [place] [address]  [phone]    Speech and Language Therapy Bedside Swallow Evaluation     Patient: [name] Cook    MRN#: [id number]   Unit: [facility] Bed: E 29/E29    Date/Time of Treatment:   Time Calculation  SLP Received On: 09/09/22  Start Time: 6213  Stop Time: 0845  Time Calculation (min): 11 min    Consult received for [name] Cook for SLP Bedside Swallow Evaluation and Treatment.    Interpreter utilized: no, not indicated    Therapist PPE during session procedural mask and gloves     Assessment   [name] Cook is a 47 y.o. female admitted 09/08/2022 for R Facial droop [R29.810]. PMHx significant for HLD, HTN, Migraine, Seizures, TIA. Consult received per stroke pathway. Pt failed nursing dysphagia screen 2/2 facial droop; however, noted to be on a diet. CT Head negative for acute findings, MRI Brain pending.    Pt appears to present with mild oral phase deficits c/b prolonged mastication and reduced bolus formation likely in the setting of facial weakness; though, adequate oral clearance and mastication noted. Low concerns for pharyngeal dysphagia with no overt s/sx of aspiration noted with thorough challenging of thin liquids, puree, and solids. Oral mechanism exam significant for R facial droop, R labial asymmetry, and lingual deviation to the R. Mildly dysphonic vocal quality noted. A/Ox4 and able to follow commands, speech intelligible. Pt education provided re: standard swallow precautions and oral motor exercises.     Recommend baseline diet of regular solids/thin liquids, meds as tolerated, standard swallow precautions. Will follow for oral mech exercises and consider cog-communication evaluation pending MRI.   Plan/Recommendations   Plan / Recommendations  Plan: begin/continue oral diet, patient/family education, other (comment) (cog-communication pending MRI)  SLP Frequency Recommended: follow-up visit only  SLP - Next Visit  Recommended: 09/10/22  Diet Solids Recommendation: Continue with current diet, Regular (IDDSI RG7)  Diet Liquids Recommendations: Thin Liquids (IDDSI TN0)  Precautions/Compensations: Awake/alert, Upright 90 degrees for all oral intake, Small bites/sips, Eat/feed slowly  Recommended Form of Meds: PO, with liquid (as tolerated)  Suggestions for Feeding: independent (might need meal tray set up 2/2 dominant hand weakness)  Recommendations: Outpatient speech  Recommendation Discussed With: : Patient, Physician, Nurse, Posted bedside  Referral(s):: Registered Dietitician    Goals:   Pt will participate in cognitive-communication evaluation pending MRI (new)  Pt will complete 1 set of prescribed oral motor exercises for facial strengthening (new)    Rehabilitation Prognosis: good due to age, family/community support, participation, and PLOF    History     Prior Speech Therapy Intervention: None per chart review or per patient reports    Social History: Obtained from: patient  Baseline Diet: Regular Diet, Thin Liquids; meds with thin liquids  Feeding: Feeds self independently  Cognition: WFL; works as Education officer, [employer] (private)    Dysphagia Related Hx  Dysphagia: denies   Recurrent PNA: denies   Recent unintentional weight loss: denies   Reflux:denies       Medical Diagnosis: Facial droop [R29.810]    Imaging:  CT Head without Contrast    Result Date: 09/09/2022   No evidence of acute intracranial abnormality. [name] Frizzle, MD 09/09/2022 8:05 AM    XR Chest AP Portable    Result Date: 09/08/2022   No acute intrathoracic abnormality. [name] Cook 09/08/2022 10:52 PM    CT Head WO Contrast    Result Date: 09/08/2022  1.Normal examination. [name], MD 09/08/2022 10:33 PM      History of Present Illness: [name] Cook is a 47 y.o. female admitted on 09/08/2022 with   Patient Active Problem List   Diagnosis    Seizures    Facial droop    Tension type headache    History of stroke    Morbid obesity with BMI of  45.0-49.9, adult    Hematemesis    Blood loss anemia    HLD (hyperlipidemia)    Paresthesia of right arm and leg    Hemorrhoids    Right hemiparesis    Nonspecific abnormal electroencephalogram (EEG)    Headache    Abdominal pain    Cellulitis of other specified site    Wound dehiscence    History of seizure disorder    HTN (hypertension)    Right sided weakness        Past Medical/Surgical History  Past Medical History:   Diagnosis Date    Hyperlipidemia     Hypertension     Migraine     Morbid obesity     Palpitations     loop recorder placed in 2019, had to be removed after a month due to misplacement and keloid formation per patient    Seizures     Stroke     2015    Thoracic aortic aneurysm     TIA (transient ischemic attack) 2017      Past Surgical History:   Procedure Laterality Date    APPENDECTOMY (OPEN)      CHOLECYSTECTOMY      EGD, BIOPSY N/A 04/16/2017    Procedure: EGD, BIOPSY;  Surgeon: Pershing Proud, MD;  Location: [facility];  Service: Gastroenterology;  Laterality: N/A;    HYSTERECTOMY           Subjective   Patient is agreeable to participation in the therapy session. Nursing clears patient for therapy. Patient's medical condition is appropriate for Speech therapy intervention at this time.  Pain: 8/10 chest pain, RN aware; also reports nausea    Objective   Observation of Patient/Vital Signs:  Patient is in bed   Completed bedside swallow evaluation and provided education regarding swallow, swallow strategies and precautions.  Present at bedside:  n/a    Orientation: A/Ox4      Respiratory Status  Current Status  Respiratory Status: room air  Behavior/Mental Status: Awake/alert, Able to follow directions, Cooperative  Nutrition: oral  Diet Prior to Study: Regular (IDDSI RG7), Thin Liquids (IDDSI TN0)    Vital Signs  Temp:  [97.5 F (36.4 C)-98.6 F (37 C)] 97.5 F (36.4 C)  Heart Rate:  [60-72] 67  Resp Rate:  [15-17] 15  BP: (112-252)/(65-134) 112/75    Oral Motor Skills  Oral Care:  Good    Dentition: Normal  Oral Sensation: WFL  Mucosal Quality: Clear/moist  Secretion Management: WFL    Cranial Nerve Exam  CNV Trigeminal (sensation to face, mandibular movements, chewing):   Intact bilaterally  CNVII Facial (taste to anterior 2/3 tongue, raise eyebrows/wrinkle forehead, smile/pucker, puff cheeks with air):   Right facial droop and Right labial asymmetry   CNIX Glossopharyngeal (taste to posterior 1/3 tongue, gag response, velar elevation):   Intact bilaterally  CN X Vagus (sensation to larynx/pharynx, cough reflex, laryngeal function, vocal quality):   Unable to r/o CNX involvement due to presence of dysphonia   CNXII Hypoglossal (lingual strength, movement, symmetry):   Tongue deviates right  Additional Comments: n/a  Deglutition Skills  Nsg Dysphagia Screen: fail  3 oz water challenge: Completed  Position: upright 90 degrees  Food(s) Tested: Thin Liquids (IDDSI TN0);Via cup;Via straw;Pureed (IDDSI P4)/Pureed;Regular (IDDSI RG7)  Oral Stage: bolus formation/control reduced, slow but effective;chewing reduced, slow but effective  Pharyngeal Stage:  (no overt s/sx of aspiration noted)    Signature  Marjory Sneddon, SLP   09/09/2022  [phone] for Rehab Tech/scheduling questions)    Attention MD:   Thank you for allowing [place] to participate in the care of [name] Cook. Regulations from the [employer] for Medicare and Medicaid Services (CMS) require your review and approval of this plan of care.     Please cosign this note indicating you are in agreement with the Therapy Plan of Care so we may initiate the therapy treatment plan.

## 2022-09-09 NOTE — ED to IP RN Note (Signed)
[facility]  ED NURSING NOTE FOR THE RECEIVING INPATIENT NURSE   ED NURSE Candi Leash [phone]   ED CHARGE RN Amy   ADMISSION INFORMATION   [name] Cook is a 47 y.o. female admitted with an ED diagnosis of:    No diagnosis found.     Isolation: None   Allergies: Contrast [iodinated contrast media], Fentanyl, Fioricet [butalbital-apap-caffeine], Flexeril [cyclobenzaprine], Motrin [ibuprofen], Tramadol, Bentyl [dicyclomine], Percocet [oxycodone-acetaminophen], Shellfish-derived products, Toradol [ketorolac tromethamine], and Tylenol [acetaminophen]   Holding Orders confirmed? No   Belongings Documented? Yes   Home medications sent to pharmacy confirmed? No   NURSING CARE   Patient Comes From:   Mental Status: Home/Family Care  alert and oriented   ADL: Independent with all ADLs   Ambulation: no difficulty   Pertinent Information  and Safety Concerns:     Broset Violence Risk Level: Low Call nurse for any concerns     CT / NIH   CT Head ordered on this patient?  Yes   NIH/Dysphagia assessment done prior to admission? Yes   VITAL SIGNS (at the time of this note)      Vitals:    09/09/22 0051   BP: 174/73   Pulse: 60   Resp: 15   Temp: 97.6 F (36.4 C)   SpO2: 97%     Pain Score: 7-severe pain (09/09/22 0051)

## 2022-09-09 NOTE — Progress Notes (Signed)
MEDICINE PROGRESS NOTE  [facility], DIVISION OF HOSPITALIST MEDICINE   [facility]   [place] Pager: [phone]      Date Time: 09/09/22 3:44 PM  Patient Name: [name]  Attending Physician: Herbert Moors, MD  Hospital Day: 2    Active Hospital Problems    Diagnosis    Facial droop             Assessment and Plan :     This is a 47 y.o. female with PMH of hypertension and hyperlipidemia and history of CVA  (2015) with no residual deficit  presents with 2 day history of right sided facial droop/weakness that started yesterday morning 7 am. 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 Her daughter noticed the facial droop when she returned home from work, so she came to the ED. She has also been complaining of cp, that feels like midsternal pressure, with some sharp pains radiating to the left shoulder, does not get worse with exertion, and is not pleuritic. She is not tender to palpation. Denies fever/chills, nausea/vomiting/diarrhea. Has been ongoing for 24 hours, improves with dilaudid. This morning at 7 am when RN was doing her exam, patient noticed /complained of right sided weakness in the right arm/leg, which she states is new. Denies any slurred speech, blurred vision, headache, dizziness. Upon further review of records she has been evaluated for CP and right sided weakness at several other outside hospitals include [facility], but she denies this. Does not recall any cardiac issues or right sided numbness in the past, or MI.     She had been previously seen on 5/8 at [facility] for similar symptoms and 5/5 at [facility]. On presented to [facility] with  right facial droop and chest pain for the last 4 days. Symptoms have been persistent. She describes the chest pain as being mid-sternal region 2 days ago, dull, persistent, radiates to  bilateral neck and flanks and back. She also states she had double vision, nausea/vomiting, and passed out prior to coming to ED. She denies any headache.      Patient was admitted at [facility] on 07/16/22 for the above complaints. Her BP was markedly elevated with systolic around 230. CT head and ECHO with bubble study was unremarkable. Neurologist consult was requested,  consult note states "I knew this pt well when she was admitted to [facility] in July and September 2023 for right facial drooping and right side weakness. The brain MRI in July 2023 was negative for stroke. Neuro exam showed her facial drooping was not consistent. Her facial weakness would spontaneously improved when she was distracted during the repeated interviews. The record at [facility] showed that she had frequent ED visit due to similar symptoms or chest pains."     Admission to [facility] in 06/2022 for chest pain and hypertensive urgency. CT chest and then MRA chest done to rule out aortic dissection negative. Prior multiple MRI and MRA chest in Dec/2023 negative. MRI brain and MRA head&neck in Dec/2023 negative for stroke. 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 Uncertain about etiology of right sided leg/arm weakness.   -neurology is following appreciate recommendations  -code stroke called this morning for new right sided weakness, but repeat CTH  did not show acute intracranial process.   -MRI brain negative for acute infarct.   -continue asa/plavix/statin  -repeat mri in 24 hours to ensure no brain stem stroke. Mra brain/neck wo contrast ordered but patient was not able to tolerate due to claustrophobia even after 5 mg iv valium. Has a contrast allergy with history of anaphylactic shock after contrast CT, so will avoid doing contrast imaging.   -neurology recommending treating with steroids prednisone 60 mg x5 days and then tapering by 10 mg daily for 5 more days (50, 40, 30,  20, 10). And valtrex 1000 tid x 10 days.  -echocardiogram with preserved EF, no significant valvular abnormalities, There is no evidence of interatrial shunt by color Doppler or by bubble  study interrogation at baseline or with provocative maneuvers.  -A1c/lipid panel pending   -neuro checks q4 h  -pt/ot/slp    #Hx Seizures  -continue keppra     #CP, atypical   -troponin neg x2. Hs troponin delta 0  -ecg SR, with some PAC/PVC, abn RWP in precordial leads, and qs waves in inferior leads, otherwise no ischemic changes  -cardiology consulted, appreciated reccs  -recommended continuing statin therapy and outpatient stress test   -scheduled pepcid, prn maalox.     #HTN  -permissive htn until mri tomorrow   -monitor   Sbp 140s-160s today   -can resume meds if tomorrow's mri neg.    #HLD   -continue statin.     #mild thrombocytopenia  -monitor cbc daily   -no active signs of bleeding       DVT Prophylaxis:  Medication VTE Prophylaxis Orders: enoxaparin (LOVENOX) syringe 40 mg  Mechanical VTE Prophylaxis Orders: Maintain sequential compression device        Foley/ lines- PIV     I spent 65 minutes for this Pt including coordination of care for conditions described in my assessment and plan.       Case was discussed with Pt ,RN, Dr . Jamey Ripa, Dr. Francesco Sor, Dr. Ezzard Standing.       Disposition:     Today's date: 09/09/2022  Length of Stay: 0  Anticipated medical stability for discharge: <2 midnights   Reason for ongoing hospitalization: bells palsy, right sided weakness arm/leg. Needs repeat mri brain tomorrow 24 hours after first mri.  Lines:     Patient Lines/Drains/Airways Status       Active PICC Line / CVC Line / PIV Line / Drain / Airway / Intraosseous Line / Epidural Line / ART Line / Line / Wound / Pressure Ulcer / NG/OG Tube       Name Placement date Placement time Site Days    Peripheral IV 09/08/22 20 G Standard Left Antecubital 09/08/22  2250  Antecubital  less than 1                     Subjective/ROS/24 hr events:   CC:  Facial droop  Interval History/24 hour events: VSS stable, no arrhythmias on the telemetry. MRI without acute stroke, start valtrex/prednisone for possible bells palsy. Repeat mri brain in 24 hours. Probable discharge tomorrow with outpatient PT/neuro follow up.  Adult diet Therapeutic/ Modified; Solid; Regular (IDDSI level 7); Thin (IDDSI level 0)  HPI/Subjective: still complaining of right sided arm/leg weakness/numbness. Still c/o right sided facial droop.        Review of Systems:   Review of Systems - Negative except as above in HPI    Physical Exam:     Temp:  [  97.4 F (36.3 C)-98.6 F (37 C)] 97.4 F (36.3 C)  Heart Rate:  [60-72] 61  Resp Rate:  [15-17] 17  BP: (112-252)/(65-134) 129/72  Intake and Output Summary-last 24 Hrs:  I/O last 3 completed shifts:  In: 50 [P.O.:50]  Out: -   O2 Flow Rate (L/min)  Avg: 2 L/min  Min: 2 L/min  Max: 2 L/min by O2 Device: Nasal cannula       General: WD female  in no acute distress,  Cardiovascular: regular rate and rhythm, no murmurs  Lungs: clear to auscultation bilaterally, no additional sounds  Abdomen: soft, non-tender, non-distended; normoactive bowel sounds  Extremities: no edema  Neurological: Alert and oriented X 3, moves all extremities. 4/5 strength RUE/RLE. 5/5 strength LUE/LLE. Right facial droop. + right sided pronator drift.   Skin:no rash or lesion         Meds:   Medications were reviewed:  Scheduled Meds:  Current Facility-Administered Medications   Medication Dose Route Frequency    aspirin  81 mg Oral Daily    atorvastatin  40 mg Oral Daily    [START ON 09/10/2022] clopidogrel  75 mg Oral Daily    enoxaparin  40 mg Subcutaneous Daily    famotidine  20 mg Oral Q12H SCH    hydrALAZINE  25 mg Oral TID    hydroCHLOROthiazide  25 mg Oral Daily    levETIRAcetam  500 mg Oral BID    lisinopril  10 mg Oral Daily     Continuous Infusions:  PRN Meds:.alum & mag hydroxide-simethicone, benzocaine-menthol, benzonatate, carboxymethylcellulose sodium, dextrose  **OR** dextrose **OR** dextrose **OR** glucagon (rDNA), hydrALAZINE, HYDROmorphone, labetalol, magnesium sulfate, melatonin, naloxone, ondansetron, potassium & sodium phosphates, potassium chloride **AND** potassium chloride, saline  Labs/Radiology:   Imaging personally reviewed, including: all available   MRI Brain WO Contrast    Result Date: 09/09/2022   1.No acute intracranial abnormality is detected. Otho Ket, DO 09/09/2022 11:32 AM    CT Head without Contrast    Result Date: 09/09/2022   No evidence of acute intracranial abnormality. [name], MD 09/09/2022 8:05 AM    XR Chest AP Portable    Result Date: 09/08/2022   No acute intrathoracic abnormality. Campbell Stall 09/08/2022 10:52 PM    CT Head WO Contrast    Result Date: 09/08/2022   1.Normal examination. [name], MD 09/08/2022 10:33 PM   Recent Labs     09/09/22  0746 09/08/22  2210   Whole Blood Glucose POCT 137* 83     Recent Labs   Lab 09/09/22  0425 09/08/22  2323   Sodium 143 143   Potassium 3.5 3.8   Chloride 108 109   BUN 11 13   Creatinine 0.8 0.9   GFR >60.0 >60.0   Glucose 120* 87   Calcium 8.7 9.1     Recent Labs   Lab 09/09/22  0425 09/08/22  2257   WBC 8.28 9.69*   Hemoglobin 13.4 13.7   Hematocrit 41.1 41.6   Platelet Count 125* 137*         Recent Labs   Lab 09/09/22  0425 09/08/22  2323   Alkaline Phosphatase 110 108   Bilirubin, Total 1.0 0.8   Bilirubin Direct  --  0.2   ALT 24 23   AST (SGOT) 22 21           Signed by: Charlott Rakes, NP  Attending attestation  I have performed a substantial portion of this visit by  personally contacting and documenting, and medical decision making component of the visit.  Patient was also seen by Charlott Rakes, NP.  I reviewed and updated the documented findings and formulated the plan of care accordingly

## 2022-09-09 NOTE — Nursing Progress Note (Signed)
Pt unable to tolerate MRI machine after first exam - Primary team aware. Pt transported by primary RN to ECHO with portable monitor

## 2022-09-09 NOTE — Plan of Care (Signed)
Problem: Pain interferes with ability to perform ADL  Goal: Pain at adequate level as identified by patient  Outcome: Progressing  Flowsheets (Taken 09/09/2022 1313)  Pain at adequate level as identified by patient:   Identify patient comfort function goal   Assess for risk of opioid induced respiratory depression, including snoring/sleep apnea. Alert healthcare team of risk factors identified.   Assess pain on admission, during daily assessment and/or before any "as needed" intervention(s)   Reassess pain within 30-60 minutes of any procedure/intervention, per Pain Assessment, Intervention, Reassessment (AIR) Cycle   Evaluate if patient comfort function goal is met   Evaluate patient's satisfaction with pain management progress     Problem: Side Effects from Pain Analgesia  Goal: Patient will experience minimal side effects of analgesic therapy  Outcome: Progressing  Flowsheets (Taken 09/09/2022 1313)  Patient will experience minimal side effects of analgesic therapy:   Monitor/assess patient's respiratory status (RR depth, effort, breath sounds)   Assess for changes in cognitive function   Prevent/manage side effects per LIP orders (i.e. nausea, vomiting, pruritus, constipation, urinary retention, etc.)   Evaluate for opioid-induced sedation with appropriate assessment tool (i.e. POSS)     Problem: Every Day - Stroke  Goal: Core/Quality measure requirements - Daily  Outcome: Progressing  Flowsheets (Taken 09/09/2022 1313)  Core/Quality measure requirements - Daily:   VTE Prevention: Ensure anticoagulant(s) administered and/or anti-embolism stockings/devices documented by end of day 2   Ensure antithrombotic administered or contraindication documented by LIP by end of day 2   Once lipid panel has resulted, check LDL. Contact provider for statin order if LDL > 70 (or ensure contraindication documented by LIP).   Continue stroke education (must include Modifiable Risk Factors, Warning Signs and Symptoms of Stroke,  Activation of Emergency Medical System and Follow-up Appointments). Ensure handout has been given and documented.  Goal: Neurological status is stable or improving  Outcome: Progressing  Flowsheets (Taken 09/09/2022 1313)  Neurological status is stable or improving:   Monitor/assess/document neurological assessment (Stroke: every 4 hours)   Monitor/assess NIH Stroke Scale   Re-assess NIH Stroke Scale for any change in status   Perform CAM Assessment   Observe for seizure activity and initiate seizure precautions if indicated  Goal: Stable vital signs and fluid balance  Outcome: Progressing  Flowsheets (Taken 09/09/2022 1313)  Stable vital signs and fluid balance:   Position patient for maximum circulation/cardiac output   Monitor and assess vitals every 4 hours or as ordered and hemodynamic parameters   Monitor intake and output. Notify LIP if urine output is < 30 mL/hour.   Apply telemetry monitor as ordered   Encourage oral fluid intake  Goal: Patient will maintain adequate oxygenation  Outcome: Progressing  Flowsheets (Taken 09/09/2022 1313)  Patient will maintain adequate oxygenation:   Maintain SpO2 of greater than 92%   Suction secretions as needed  Goal: Patient's risk of aspiration will be minimized  Outcome: Progressing  Flowsheets (Taken 09/09/2022 1313)  Patient's risk of aspiration will be minimized:   Place swallow precaution signage above bed   Complete new dysphagia screen for any change in status: Keep patient NPO if patient fails   Monitor/assess for signs of aspiration (tachypnea, cough, wheezing, clearing throat, hoarseness after eating, decrease in SaO2   Order modified texture diet as recommend by Speech Pathologist   Thicken liquids as recommended by Speech Pathologist   Keep head of bed up a minimum of 30 degrees when hemodynamically stable   Assess and monitor ability  to swallow  Goal: Nutritional intake is adequate  Outcome: Progressing  Goal: Elimination patterns are normal or  improving  Outcome: Progressing  Flowsheets (Taken 09/09/2022 1313)  Elimination patterns are normal or improving: Assess for and discuss C. diff screening with LIP  Goal: Mobility/Activity is maintained at optimal level for patient  Outcome: Progressing  Flowsheets (Taken 09/09/2022 1313)  Mobility/activity is maintained at optimal level for patient: Encourage independent activity per ability  Goal: Skin integrity is maintained or improved  Outcome: Progressing  Flowsheets (Taken 09/09/2022 1313)  Skin integrity is maintained or improved:   Assess Braden Scale every shift   Turn or reposition patient every 2 hours or as needed unless able to reposition self   Increase activity as tolerated/progressive mobility   Relieve pressure to bony prominences   Encourage use of lotion/moisturizer on skin   Keep skin clean and dry   Avoid shearing   Monitor patient's hygiene practices  Goal: Neurovascular status is stable or improving  Outcome: Progressing  Flowsheets (Taken 09/09/2022 1313)  Neurovascular status is stable or improving:   Monitor/assess neurovascular status (pulses, capillary refill, pain, paresthesia, presence of edema)   Monitor/assess for signs of Venous Thrombus Emboli (edema of calf/thigh redness, pain)   Monitor/assess site of invasive procedure for signs of bleeding  Goal: Effective coping demonstrated  Outcome: Progressing  Flowsheets (Taken 09/09/2022 1313)  Effective coping demonstrated: Offer reassurance to decrease anxiety  Goal: Will be able to express needs and understand communication  Outcome: Progressing  Flowsheets (Taken 09/09/2022 1313)  Able to express needs and understand communication: Consult/collaborate with Speech Language Pathology (SLP)

## 2022-09-10 LAB — LAB USE ONLY - CBC WITH DIFFERENTIAL
Absolute Basophils: 0.03 10*3/uL (ref 0.00–0.08)
Absolute Eosinophils: 0.19 10*3/uL (ref 0.00–0.44)
Absolute Immature Granulocytes: 0.01 10*3/uL (ref 0.00–0.07)
Absolute Lymphocytes: 1.86 10*3/uL (ref 0.42–3.22)
Absolute Monocytes: 0.58 10*3/uL (ref 0.21–0.85)
Absolute Neutrophils: 4.86 10*3/uL (ref 1.10–6.33)
Absolute nRBC: 0 10*3/uL (ref <=0.00)
Basophils %: 0.4 %
Eosinophils %: 2.5 %
Hematocrit: 40.3 % (ref 34.7–43.7)
Hemoglobin: 13.1 g/dL (ref 11.4–14.8)
Immature Granulocytes %: 0.1 %
Lymphocytes %: 24.7 %
MCH: 31.6 pg (ref 25.1–33.5)
MCHC: 32.5 g/dL (ref 31.5–35.8)
MCV: 97.1 fL — ABNORMAL HIGH (ref 78.0–96.0)
MPV: 13.9 fL — ABNORMAL HIGH (ref 8.9–12.5)
Monocytes %: 7.7 %
Neutrophils %: 64.6 %
Platelet Count: 123 10*3/uL — ABNORMAL LOW (ref 142–346)
Preliminary Absolute Neutrophil Count: 4.86 10*3/uL (ref 1.10–6.33)
RBC: 4.15 10*6/uL (ref 3.90–5.10)
RDW: 13 % (ref 11–15)
WBC: 7.53 10*3/uL (ref 3.10–9.50)
nRBC %: 0 /100 WBC (ref <=0.0)

## 2022-09-10 LAB — BASIC METABOLIC PANEL
Anion Gap: 7 (ref 5.0–15.0)
BUN: 13 mg/dL (ref 7–21)
CO2: 25 mEq/L (ref 17–29)
Calcium: 8.7 mg/dL (ref 8.5–10.5)
Chloride: 109 mEq/L (ref 99–111)
Creatinine: 0.9 mg/dL (ref 0.4–1.0)
GFR: >60 mL/min/{1.73_m2} (ref >=60.0)
Glucose: 111 mg/dL — ABNORMAL HIGH (ref 70–100)
Potassium: 4 mEq/L (ref 3.5–5.3)
Sodium: 141 mEq/L (ref 135–145)

## 2022-09-10 LAB — LIPID PANEL
Cholesterol / HDL Ratio: 5.5 Index
Cholesterol: 165 mg/dL (ref <=199)
HDL: 30 mg/dL — ABNORMAL LOW (ref >=40)
LDL Calculated: 116 mg/dL (ref 0–129)
Triglycerides: 94 mg/dL (ref 34–149)
VLDL Calculated: 19 mg/dL (ref 10–40)

## 2022-09-10 LAB — HEMOGLOBIN A1C
Average Estimated Glucose: 116.9 mg/dL
Hemoglobin A1C: 5.7 % — ABNORMAL HIGH (ref 4.6–5.6)

## 2022-09-10 LAB — MAGNESIUM: Magnesium: 2.2 mg/dL (ref 1.6–2.6)

## 2022-09-10 MED ORDER — DIAZEPAM 5 MG/ML IJ SOLN
2.5000 mg | Freq: Once | INTRAMUSCULAR | Status: DC
Start: 2022-09-10 — End: 2022-09-10

## 2022-09-10 MED ORDER — HYDROMORPHONE HCL 2 MG PO TABS
2.0000 mg | ORAL_TABLET | Freq: Once | ORAL | Status: DC
Start: 2022-09-10 — End: 2022-09-10

## 2022-09-10 MED ORDER — PREDNISONE 20 MG PO TABS
ORAL_TABLET | ORAL | 0 refills | Status: AC
Start: 2022-09-10 — End: 2022-09-19

## 2022-09-10 MED ORDER — ASPIRIN 81 MG PO CHEW
81.0000 mg | CHEWABLE_TABLET | Freq: Every day | ORAL | 0 refills | Status: AC
Start: 2022-09-11 — End: 2022-10-11

## 2022-09-10 MED ORDER — VALACYCLOVIR HCL 1 G PO TABS
1000.0000 mg | ORAL_TABLET | Freq: Three times a day (TID) | ORAL | 0 refills | Status: AC
Start: 2022-09-10 — End: 2022-09-19

## 2022-09-10 NOTE — Nursing Progress Note (Signed)
[facility] at change of shift with night RN is a 4

## 2022-09-10 NOTE — Plan of Care (Signed)
Problem: Pain interferes with ability to perform ADL  Goal: Pain at adequate level as identified by patient  09/10/2022 1305 by Hoyle Barr, RN  Outcome: Adequate for Discharge  09/10/2022 1117 by Hoyle Barr, RN  Outcome: Progressing  Flowsheets (Taken 09/10/2022 1117)  Pain at adequate level as identified by patient:   Identify patient comfort function goal   Assess for risk of opioid induced respiratory depression, including snoring/sleep apnea. Alert healthcare team of risk factors identified.   Assess pain on admission, during daily assessment and/or before any "as needed" intervention(s)   Reassess pain within 30-60 minutes of any procedure/intervention, per Pain Assessment, Intervention, Reassessment (AIR) Cycle   Evaluate if patient comfort function goal is met   Evaluate patient's satisfaction with pain management progress     Problem: Side Effects from Pain Analgesia  Goal: Patient will experience minimal side effects of analgesic therapy  09/10/2022 1305 by Hoyle Barr, RN  Outcome: Adequate for Discharge  09/10/2022 1117 by Hoyle Barr, RN  Outcome: Progressing  Flowsheets (Taken 09/10/2022 1117)  Patient will experience minimal side effects of analgesic therapy:   Monitor/assess patient's respiratory status (RR depth, effort, breath sounds)   Assess for changes in cognitive function   Prevent/manage side effects per LIP orders (i.e. nausea, vomiting, pruritus, constipation, urinary retention, etc.)   Evaluate for opioid-induced sedation with appropriate assessment tool (i.e. POSS)     Problem: Safety  Goal: Patient will be free from injury during hospitalization  Outcome: Adequate for Discharge  Goal: Patient will be free from infection during hospitalization  Outcome: Adequate for Discharge     Problem: Day of Admission - Stroke  Goal: Core/Quality measure requirements - Admission  Outcome: Adequate for Discharge     Problem: Every Day - Stroke  Goal: Core/Quality measure  requirements - Daily  09/10/2022 1305 by Hoyle Barr, RN  Outcome: Adequate for Discharge  09/10/2022 1117 by Hoyle Barr, RN  Outcome: Progressing  Flowsheets (Taken 09/10/2022 1117)  Core/Quality measure requirements - Daily:   VTE Prevention: Ensure anticoagulant(s) administered and/or anti-embolism stockings/devices documented by end of day 2   Ensure antithrombotic administered or contraindication documented by LIP by end of day 2   Once lipid panel has resulted, check LDL. Contact provider for statin order if LDL > 70 (or ensure contraindication documented by LIP).   Continue stroke education (must include Modifiable Risk Factors, Warning Signs and Symptoms of Stroke, Activation of Emergency Medical System and Follow-up Appointments). Ensure handout has been given and documented.  Goal: Neurological status is stable or improving  09/10/2022 1305 by Hoyle Barr, RN  Outcome: Adequate for Discharge  09/10/2022 1117 by Hoyle Barr, RN  Outcome: Progressing  Flowsheets (Taken 09/10/2022 1117)  Neurological status is stable or improving:   Monitor/assess/document neurological assessment (Stroke: every 4 hours)   Observe for seizure activity and initiate seizure precautions if indicated   Re-assess NIH Stroke Scale for any change in status   Monitor/assess NIH Stroke Scale   Perform CAM Assessment  Goal: Stable vital signs and fluid balance  09/10/2022 1305 by Hoyle Barr, RN  Outcome: Adequate for Discharge  09/10/2022 1117 by Hoyle Barr, RN  Outcome: Progressing  Flowsheets (Taken 09/10/2022 1117)  Stable vital signs and fluid balance:   Position patient for maximum circulation/cardiac output   Monitor and assess vitals every 4 hours or as ordered and hemodynamic parameters   Monitor intake and output. Notify LIP if  urine output is < 30 mL/hour.   Encourage oral fluid intake   Apply telemetry monitor as ordered  Goal: Patient will maintain adequate oxygenation  09/10/2022 1305 by  Hoyle Barr, RN  Outcome: Adequate for Discharge  09/10/2022 1117 by Hoyle Barr, RN  Outcome: Progressing  Flowsheets (Taken 09/10/2022 1117)  Patient will maintain adequate oxygenation:   Suction secretions as needed   Maintain SpO2 of greater than 92%  Goal: Patient's risk of aspiration will be minimized  09/10/2022 1305 by Hoyle Barr, RN  Outcome: Adequate for Discharge  09/10/2022 1117 by Hoyle Barr, RN  Outcome: Progressing  Flowsheets (Taken 09/10/2022 1117)  Patient's risk of aspiration will be minimized:   Complete new dysphagia screen for any change in status: Keep patient NPO if patient fails   Place swallow precaution signage above bed   Monitor/assess for signs of aspiration (tachypnea, cough, wheezing, clearing throat, hoarseness after eating, decrease in SaO2   Order modified texture diet as recommend by Speech Pathologist   Thicken liquids as recommended by Speech Pathologist   Assess and monitor ability to swallow   Keep head of bed up a minimum of 30 degrees when hemodynamically stable  Goal: Nutritional intake is adequate  09/10/2022 1305 by Hoyle Barr, RN  Outcome: Adequate for Discharge  09/10/2022 1117 by Hoyle Barr, RN  Outcome: Progressing  Flowsheets (Taken 09/10/2022 1117)  Nutritional intake is adequate: Consult/collaborate with Clinical Nutritionist  Goal: Elimination patterns are normal or improving  09/10/2022 1305 by Hoyle Barr, RN  Outcome: Adequate for Discharge  09/10/2022 1117 by Hoyle Barr, RN  Outcome: Progressing  Flowsheets (Taken 09/10/2022 1117)  Elimination patterns are normal or improving: Assess for and discuss C. diff screening with LIP  Goal: Mobility/Activity is maintained at optimal level for patient  09/10/2022 1305 by Hoyle Barr, RN  Outcome: Adequate for Discharge  09/10/2022 1117 by Hoyle Barr, RN  Outcome: Progressing  Flowsheets (Taken 09/10/2022 1117)  Mobility/activity is maintained at optimal  level for patient:   Consult/collaborate with Physical Therapy and/or Occupational Therapy   Encourage independent activity per ability  Goal: Skin integrity is maintained or improved  09/10/2022 1305 by Hoyle Barr, RN  Outcome: Adequate for Discharge  09/10/2022 1117 by Hoyle Barr, RN  Outcome: Progressing  Flowsheets (Taken 09/10/2022 1117)  Skin integrity is maintained or improved:   Assess Braden Scale every shift   Turn or reposition patient every 2 hours or as needed unless able to reposition self   Increase activity as tolerated/progressive mobility   Relieve pressure to bony prominences   Encourage use of lotion/moisturizer on skin   Keep skin clean and dry   Avoid shearing   Monitor patient's hygiene practices  Goal: Neurovascular status is stable or improving  09/10/2022 1305 by Hoyle Barr, RN  Outcome: Adequate for Discharge  09/10/2022 1117 by Hoyle Barr, RN  Outcome: Progressing  Flowsheets (Taken 09/10/2022 1117)  Neurovascular status is stable or improving:   Monitor/assess for signs of Venous Thrombus Emboli (edema of calf/thigh redness, pain)   Monitor/assess neurovascular status (pulses, capillary refill, pain, paresthesia, presence of edema)   Monitor/assess site of invasive procedure for signs of bleeding  Goal: Effective coping demonstrated  09/10/2022 1305 by Hoyle Barr, RN  Outcome: Adequate for Discharge  09/10/2022 1117 by Hoyle Barr, RN  Outcome: Progressing  Flowsheets (Taken 09/10/2022 1117)  Effective coping demonstrated:   Assess/report  to LIP uncontrolled anxiety, depression, or ineffective coping   Offer reassurance to decrease anxiety  Goal: Will be able to express needs and understand communication  09/10/2022 1305 by Hoyle Barr, RN  Outcome: Adequate for Discharge  09/10/2022 1117 by Hoyle Barr, RN  Outcome: Progressing  Flowsheets (Taken 09/10/2022 1117)  Able to express needs and understand communication: Consult/collaborate  with Speech Language Pathology (SLP)     Problem: Day of Discharge - Stroke  Goal: Able to express understanding of discharge instructions and stroke education  09/10/2022 1305 by Hoyle Barr, RN  Outcome: Adequate for Discharge  09/10/2022 1117 by Hoyle Barr, RN  Outcome: Progressing  Flowsheets (Taken 09/10/2022 1117)  Able to express understanding of discharge instructions and stroke education:   Consult/collaborate with Case Management/Social Work   Include patient care companion in decisions related to communication  Goal: Core/Quality measures - Discharge  09/10/2022 1305 by Hoyle Barr, RN  Outcome: Adequate for Discharge  09/10/2022 1117 by Hoyle Barr, RN  Outcome: Progressing  Flowsheets (Taken 09/10/2022 1117)  Core/Quality measures - Discharge:   Document discharge NIH Stroke Scale   Document Modified Rankin Scale ([place] only)   Ensure antithrombotic ordered or contraindication documented by LIP   Ensure statin ordered or contraindication documented by LIP   If diagnosis or history of A-fib/A-flutter, ensure anticoagulation ordered or contraindication documented by LIP   Complete final verification medications: check and compare prior to admit (PTA), during hospitalization and discharge medication list   Document discharge stroke education completed and patient/family verbalizes understanding

## 2022-09-10 NOTE — Nursing Progress Note (Signed)
Pt A&Ox4 running Brady/NSR with PVC on RA. Pt requesting pain meds. Recs for PO pain medication due to d/c today. Pt stated that "She  rather suffer". NP notified. Pt to be d/c'd this afternoon per NP. Bed in lowest position with call light in reach /

## 2022-09-10 NOTE — Plan of Care (Signed)
Problem: Pain interferes with ability to perform ADL  Goal: Pain at adequate level as identified by patient  Outcome: Progressing  Flowsheets (Taken 09/10/2022 1117)  Pain at adequate level as identified by patient:   Identify patient comfort function goal   Assess for risk of opioid induced respiratory depression, including snoring/sleep apnea. Alert healthcare team of risk factors identified.   Assess pain on admission, during daily assessment and/or before any "as needed" intervention(s)   Reassess pain within 30-60 minutes of any procedure/intervention, per Pain Assessment, Intervention, Reassessment (AIR) Cycle   Evaluate if patient comfort function goal is met   Evaluate patient's satisfaction with pain management progress     Problem: Side Effects from Pain Analgesia  Goal: Patient will experience minimal side effects of analgesic therapy  Outcome: Progressing  Flowsheets (Taken 09/10/2022 1117)  Patient will experience minimal side effects of analgesic therapy:   Monitor/assess patient's respiratory status (RR depth, effort, breath sounds)   Assess for changes in cognitive function   Prevent/manage side effects per LIP orders (i.e. nausea, vomiting, pruritus, constipation, urinary retention, etc.)   Evaluate for opioid-induced sedation with appropriate assessment tool (i.e. POSS)     Problem: Every Day - Stroke  Goal: Core/Quality measure requirements - Daily  Outcome: Progressing  Flowsheets (Taken 09/10/2022 1117)  Core/Quality measure requirements - Daily:   VTE Prevention: Ensure anticoagulant(s) administered and/or anti-embolism stockings/devices documented by end of day 2   Ensure antithrombotic administered or contraindication documented by LIP by end of day 2   Once lipid panel has resulted, check LDL. Contact provider for statin order if LDL > 70 (or ensure contraindication documented by LIP).   Continue stroke education (must include Modifiable Risk Factors, Warning Signs and Symptoms of Stroke,  Activation of Emergency Medical System and Follow-up Appointments). Ensure handout has been given and documented.  Goal: Neurological status is stable or improving  Outcome: Progressing  Flowsheets (Taken 09/10/2022 1117)  Neurological status is stable or improving:   Monitor/assess/document neurological assessment (Stroke: every 4 hours)   Observe for seizure activity and initiate seizure precautions if indicated   Re-assess NIH Stroke Scale for any change in status   Monitor/assess NIH Stroke Scale   Perform CAM Assessment  Goal: Stable vital signs and fluid balance  Outcome: Progressing  Flowsheets (Taken 09/10/2022 1117)  Stable vital signs and fluid balance:   Position patient for maximum circulation/cardiac output   Monitor and assess vitals every 4 hours or as ordered and hemodynamic parameters   Monitor intake and output. Notify LIP if urine output is < 30 mL/hour.   Encourage oral fluid intake   Apply telemetry monitor as ordered  Goal: Patient will maintain adequate oxygenation  Outcome: Progressing  Flowsheets (Taken 09/10/2022 1117)  Patient will maintain adequate oxygenation:   Suction secretions as needed   Maintain SpO2 of greater than 92%  Goal: Patient's risk of aspiration will be minimized  Outcome: Progressing  Flowsheets (Taken 09/10/2022 1117)  Patient's risk of aspiration will be minimized:   Complete new dysphagia screen for any change in status: Keep patient NPO if patient fails   Place swallow precaution signage above bed   Monitor/assess for signs of aspiration (tachypnea, cough, wheezing, clearing throat, hoarseness after eating, decrease in SaO2   Order modified texture diet as recommend by Speech Pathologist   Thicken liquids as recommended by Speech Pathologist   Assess and monitor ability to swallow   Keep head of bed up a minimum of 30 degrees when  hemodynamically stable  Goal: Nutritional intake is adequate  Outcome: Progressing  Flowsheets (Taken 09/10/2022 1117)  Nutritional intake is  adequate: Consult/collaborate with Clinical Nutritionist  Goal: Elimination patterns are normal or improving  Outcome: Progressing  Flowsheets (Taken 09/10/2022 1117)  Elimination patterns are normal or improving: Assess for and discuss C. diff screening with LIP  Goal: Mobility/Activity is maintained at optimal level for patient  Outcome: Progressing  Flowsheets (Taken 09/10/2022 1117)  Mobility/activity is maintained at optimal level for patient:   Consult/collaborate with Physical Therapy and/or Occupational Therapy   Encourage independent activity per ability  Goal: Skin integrity is maintained or improved  Outcome: Progressing  Flowsheets (Taken 09/10/2022 1117)  Skin integrity is maintained or improved:   Assess Braden Scale every shift   Turn or reposition patient every 2 hours or as needed unless able to reposition self   Increase activity as tolerated/progressive mobility   Relieve pressure to bony prominences   Encourage use of lotion/moisturizer on skin   Keep skin clean and dry   Avoid shearing   Monitor patient's hygiene practices  Goal: Neurovascular status is stable or improving  Outcome: Progressing  Flowsheets (Taken 09/10/2022 1117)  Neurovascular status is stable or improving:   Monitor/assess for signs of Venous Thrombus Emboli (edema of calf/thigh redness, pain)   Monitor/assess neurovascular status (pulses, capillary refill, pain, paresthesia, presence of edema)   Monitor/assess site of invasive procedure for signs of bleeding  Goal: Effective coping demonstrated  Outcome: Progressing  Flowsheets (Taken 09/10/2022 1117)  Effective coping demonstrated:   Assess/report to LIP uncontrolled anxiety, depression, or ineffective coping   Offer reassurance to decrease anxiety  Goal: Will be able to express needs and understand communication  Outcome: Progressing  Flowsheets (Taken 09/10/2022 1117)  Able to express needs and understand communication: Consult/collaborate with Speech Language Pathology (SLP)      Problem: Day of Discharge - Stroke  Goal: Able to express understanding of discharge instructions and stroke education  Outcome: Progressing  Flowsheets (Taken 09/10/2022 1117)  Able to express understanding of discharge instructions and stroke education:   Consult/collaborate with Case Management/Social Work   Include patient care companion in decisions related to communication  Goal: Core/Quality measures - Discharge  Outcome: Progressing  Flowsheets (Taken 09/10/2022 1117)  Core/Quality measures - Discharge:   Document discharge NIH Stroke Scale   Document Modified Rankin Scale ([place] only)   Ensure antithrombotic ordered or contraindication documented by LIP   Ensure statin ordered or contraindication documented by LIP   If diagnosis or history of A-fib/A-flutter, ensure anticoagulation ordered or contraindication documented by LIP   Complete final verification medications: check and compare prior to admit (PTA), during hospitalization and discharge medication list   Document discharge stroke education completed and patient/family verbalizes understanding

## 2022-09-10 NOTE — Plan of Care (Signed)
Problem: Pain interferes with ability to perform ADL  Goal: Pain at adequate level as identified by patient  09/10/2022 1305 by Hoyle Barr, RN  Outcome: Completed  09/10/2022 1305 by Hoyle Barr, RN  Outcome: Adequate for Discharge  09/10/2022 1117 by Hoyle Barr, RN  Outcome: Progressing  Flowsheets (Taken 09/10/2022 1117)  Pain at adequate level as identified by patient:   Identify patient comfort function goal   Assess for risk of opioid induced respiratory depression, including snoring/sleep apnea. Alert healthcare team of risk factors identified.   Assess pain on admission, during daily assessment and/or before any "as needed" intervention(s)   Reassess pain within 30-60 minutes of any procedure/intervention, per Pain Assessment, Intervention, Reassessment (AIR) Cycle   Evaluate if patient comfort function goal is met   Evaluate patient's satisfaction with pain management progress     Problem: Side Effects from Pain Analgesia  Goal: Patient will experience minimal side effects of analgesic therapy  09/10/2022 1305 by Hoyle Barr, RN  Outcome: Completed  09/10/2022 1305 by Hoyle Barr, RN  Outcome: Adequate for Discharge  09/10/2022 1117 by Hoyle Barr, RN  Outcome: Progressing  Flowsheets (Taken 09/10/2022 1117)  Patient will experience minimal side effects of analgesic therapy:   Monitor/assess patient's respiratory status (RR depth, effort, breath sounds)   Assess for changes in cognitive function   Prevent/manage side effects per LIP orders (i.e. nausea, vomiting, pruritus, constipation, urinary retention, etc.)   Evaluate for opioid-induced sedation with appropriate assessment tool (i.e. POSS)     Problem: Safety  Goal: Patient will be free from injury during hospitalization  09/10/2022 1305 by Hoyle Barr, RN  Outcome: Completed  09/10/2022 1305 by Hoyle Barr, RN  Outcome: Adequate for Discharge  Goal: Patient will be free from infection during  hospitalization  09/10/2022 1305 by Hoyle Barr, RN  Outcome: Completed  09/10/2022 1305 by Hoyle Barr, RN  Outcome: Adequate for Discharge     Problem: Day of Admission - Stroke  Goal: Core/Quality measure requirements - Admission  09/10/2022 1305 by Hoyle Barr, RN  Outcome: Completed  09/10/2022 1305 by Hoyle Barr, RN  Outcome: Adequate for Discharge     Problem: Every Day - Stroke  Goal: Core/Quality measure requirements - Daily  09/10/2022 1305 by Hoyle Barr, RN  Outcome: Completed  09/10/2022 1305 by Hoyle Barr, RN  Outcome: Adequate for Discharge  09/10/2022 1117 by Hoyle Barr, RN  Outcome: Progressing  Flowsheets (Taken 09/10/2022 1117)  Core/Quality measure requirements - Daily:   VTE Prevention: Ensure anticoagulant(s) administered and/or anti-embolism stockings/devices documented by end of day 2   Ensure antithrombotic administered or contraindication documented by LIP by end of day 2   Once lipid panel has resulted, check LDL. Contact provider for statin order if LDL > 70 (or ensure contraindication documented by LIP).   Continue stroke education (must include Modifiable Risk Factors, Warning Signs and Symptoms of Stroke, Activation of Emergency Medical System and Follow-up Appointments). Ensure handout has been given and documented.  Goal: Neurological status is stable or improving  09/10/2022 1305 by Hoyle Barr, RN  Outcome: Completed  09/10/2022 1305 by Hoyle Barr, RN  Outcome: Adequate for Discharge  09/10/2022 1117 by Hoyle Barr, RN  Outcome: Progressing  Flowsheets (Taken 09/10/2022 1117)  Neurological status is stable or improving:   Monitor/assess/document neurological assessment (Stroke: every 4 hours)   Observe for seizure activity and initiate seizure  precautions if indicated   Re-assess NIH Stroke Scale for any change in status   Monitor/assess NIH Stroke Scale   Perform CAM Assessment  Goal: Stable vital signs and fluid  balance  09/10/2022 1305 by Hoyle Barr, RN  Outcome: Completed  09/10/2022 1305 by Hoyle Barr, RN  Outcome: Adequate for Discharge  09/10/2022 1117 by Hoyle Barr, RN  Outcome: Progressing  Flowsheets (Taken 09/10/2022 1117)  Stable vital signs and fluid balance:   Position patient for maximum circulation/cardiac output   Monitor and assess vitals every 4 hours or as ordered and hemodynamic parameters   Monitor intake and output. Notify LIP if urine output is < 30 mL/hour.   Encourage oral fluid intake   Apply telemetry monitor as ordered  Goal: Patient will maintain adequate oxygenation  09/10/2022 1305 by Hoyle Barr, RN  Outcome: Completed  09/10/2022 1305 by Hoyle Barr, RN  Outcome: Adequate for Discharge  09/10/2022 1117 by Hoyle Barr, RN  Outcome: Progressing  Flowsheets (Taken 09/10/2022 1117)  Patient will maintain adequate oxygenation:   Suction secretions as needed   Maintain SpO2 of greater than 92%  Goal: Patient's risk of aspiration will be minimized  09/10/2022 1305 by Hoyle Barr, RN  Outcome: Completed  09/10/2022 1305 by Hoyle Barr, RN  Outcome: Adequate for Discharge  09/10/2022 1117 by Hoyle Barr, RN  Outcome: Progressing  Flowsheets (Taken 09/10/2022 1117)  Patient's risk of aspiration will be minimized:   Complete new dysphagia screen for any change in status: Keep patient NPO if patient fails   Place swallow precaution signage above bed   Monitor/assess for signs of aspiration (tachypnea, cough, wheezing, clearing throat, hoarseness after eating, decrease in SaO2   Order modified texture diet as recommend by Speech Pathologist   Thicken liquids as recommended by Speech Pathologist   Assess and monitor ability to swallow   Keep head of bed up a minimum of 30 degrees when hemodynamically stable  Goal: Nutritional intake is adequate  09/10/2022 1305 by Hoyle Barr, RN  Outcome: Completed  09/10/2022 1305 by Hoyle Barr,  RN  Outcome: Adequate for Discharge  09/10/2022 1117 by Hoyle Barr, RN  Outcome: Progressing  Flowsheets (Taken 09/10/2022 1117)  Nutritional intake is adequate: Consult/collaborate with Clinical Nutritionist  Goal: Elimination patterns are normal or improving  09/10/2022 1305 by Hoyle Barr, RN  Outcome: Completed  09/10/2022 1305 by Hoyle Barr, RN  Outcome: Adequate for Discharge  09/10/2022 1117 by Hoyle Barr, RN  Outcome: Progressing  Flowsheets (Taken 09/10/2022 1117)  Elimination patterns are normal or improving: Assess for and discuss C. diff screening with LIP  Goal: Mobility/Activity is maintained at optimal level for patient  09/10/2022 1305 by Hoyle Barr, RN  Outcome: Completed  09/10/2022 1305 by Hoyle Barr, RN  Outcome: Adequate for Discharge  09/10/2022 1117 by Hoyle Barr, RN  Outcome: Progressing  Flowsheets (Taken 09/10/2022 1117)  Mobility/activity is maintained at optimal level for patient:   Consult/collaborate with Physical Therapy and/or Occupational Therapy   Encourage independent activity per ability  Goal: Skin integrity is maintained or improved  09/10/2022 1305 by Hoyle Barr, RN  Outcome: Completed  09/10/2022 1305 by Hoyle Barr, RN  Outcome: Adequate for Discharge  09/10/2022 1117 by Hoyle Barr, RN  Outcome: Progressing  Flowsheets (Taken 09/10/2022 1117)  Skin integrity is maintained or improved:   Assess Braden Scale every shift  Turn or reposition patient every 2 hours or as needed unless able to reposition self   Increase activity as tolerated/progressive mobility   Relieve pressure to bony prominences   Encourage use of lotion/moisturizer on skin   Keep skin clean and dry   Avoid shearing   Monitor patient's hygiene practices  Goal: Neurovascular status is stable or improving  09/10/2022 1305 by Hoyle Barr, RN  Outcome: Completed  09/10/2022 1305 by Hoyle Barr, RN  Outcome: Adequate for  Discharge  09/10/2022 1117 by Hoyle Barr, RN  Outcome: Progressing  Flowsheets (Taken 09/10/2022 1117)  Neurovascular status is stable or improving:   Monitor/assess for signs of Venous Thrombus Emboli (edema of calf/thigh redness, pain)   Monitor/assess neurovascular status (pulses, capillary refill, pain, paresthesia, presence of edema)   Monitor/assess site of invasive procedure for signs of bleeding  Goal: Effective coping demonstrated  09/10/2022 1305 by Hoyle Barr, RN  Outcome: Completed  09/10/2022 1305 by Hoyle Barr, RN  Outcome: Adequate for Discharge  09/10/2022 1117 by Hoyle Barr, RN  Outcome: Progressing  Flowsheets (Taken 09/10/2022 1117)  Effective coping demonstrated:   Assess/report to LIP uncontrolled anxiety, depression, or ineffective coping   Offer reassurance to decrease anxiety  Goal: Will be able to express needs and understand communication  09/10/2022 1305 by Hoyle Barr, RN  Outcome: Completed  09/10/2022 1305 by Hoyle Barr, RN  Outcome: Adequate for Discharge  09/10/2022 1117 by Hoyle Barr, RN  Outcome: Progressing  Flowsheets (Taken 09/10/2022 1117)  Able to express needs and understand communication: Consult/collaborate with Speech Language Pathology (SLP)     Problem: Day of Discharge - Stroke  Goal: Able to express understanding of discharge instructions and stroke education  09/10/2022 1305 by Hoyle Barr, RN  Outcome: Completed  09/10/2022 1305 by Hoyle Barr, RN  Outcome: Adequate for Discharge  09/10/2022 1117 by Hoyle Barr, RN  Outcome: Progressing  Flowsheets (Taken 09/10/2022 1117)  Able to express understanding of discharge instructions and stroke education:   Consult/collaborate with Case Management/Social Work   Include patient care companion in decisions related to communication  Goal: Core/Quality measures - Discharge  09/10/2022 1305 by Hoyle Barr, RN  Outcome: Completed  09/10/2022 1305 by Hoyle Barr, RN  Outcome: Adequate for Discharge  09/10/2022 1117 by Hoyle Barr, RN  Outcome: Progressing  Flowsheets (Taken 09/10/2022 1117)  Core/Quality measures - Discharge:   Document discharge NIH Stroke Scale   Document Modified Rankin Scale ([place] only)   Ensure antithrombotic ordered or contraindication documented by LIP   Ensure statin ordered or contraindication documented by LIP   If diagnosis or history of A-fib/A-flutter, ensure anticoagulation ordered or contraindication documented by LIP   Complete final verification medications: check and compare prior to admit (PTA), during hospitalization and discharge medication list   Document discharge stroke education completed and patient/family verbalizes understanding

## 2022-09-10 NOTE — Discharge Instr - AVS First Page (Signed)
Follow-up with PCP for repeat of your platelet count, please have them refer you to a sleep medicine specialist as you may have sleep apnea and need to be evaluated with a sleep study and possible CPAP prescription.  Please continue your Valtrex and prednisone taper, please follow-up with neurology outpatient and cardiology for an outpatient stress test.  You should be taking daily aspirin 81 mg to prevent future strokes, given your risk factors. Nsaids are listed as an allergy, but you had 2 doses of aspirin here in the hospital and tolerated well.

## 2022-09-10 NOTE — Nursing Progress Note (Addendum)
Pt c/o dizziness - meclizine offered by provider. Pt refused stating she will "be okay". States she will just "go home and lay down". Pt wanted to "get some air"

## 2022-09-10 NOTE — Discharge Summary -  Nursing (Addendum)
Discharge orders received from physician. Patient was given discharge instructions and verbalized understanding of all instructions, medications, and follow-ups necessary. The patients IV was discontinued without complication. The patient is leaving the unit in stable condition at this time with all of her belongings.

## 2022-09-10 NOTE — Discharge Summary (Signed)
DISCHARGE SUMMARY/PROGRESS NOTE  [facility], DIVISION OF HOSPITALIST MEDICINE    [facility]      Date Time: 09/10/22 12:53 PM  Patient Name: [name] Cook  Attending Physician: Corky Sing, MD  Primary Care Physician: Unknown Foley, NP    Date of Admission: 09/08/2022  Date of Discharge: 09/10/2022    Outcome of Hospitalization:   Principal Problem:    Facial droop  Resolved Problems:    * No resolved hospital problems. *     Significant Events:     This is a 47 y.o. female with PMH of hypertension and hyperlipidemia and history of CVA  (2015) with no residual deficit  presents with 2 day history of right sided facial droop/weakness that started yesterday morning 7 am. 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 Her daughter noticed the facial droop when she returned home from work, so she came to the ED. She has also been complaining of cp, that feels like midsternal pressure, with some sharp pains radiating to the left shoulder, does not get worse with exertion, and is not pleuritic. She is not tender to palpation. Denies fever/chills, nausea/vomiting/diarrhea. Has been ongoing for 24 hours, improves with dilaudid. Yesterday morning at 7 am when RN was doing her exam, patient noticed /complained of right sided weakness in the right arm/leg, which she states is new. Denies any slurred speech, blurred vision, headache, dizziness. Upon further review of records she has been evaluated for CP and right sided weakness at several other outside hospitals include [facility] in [facility], but she denies this. Does not recall any cardiac issues or right sided numbness in the past, or MI.      She had been previously seen on 5/8 at [facility] for similar symptoms and 5/5 at [place] [place].  CT head and ECHO with bubble study was unremarkable at that time.  Neurologist consult was  requested,  consult note states "I knew this pt well when she was admitted to [facility] in July and September 2023 for right facial drooping and right side weakness. The brain MRI in July 2023 was negative for stroke. Neuro exam showed her facial drooping was not consistent. Her facial weakness would spontaneously improved when she was distracted during the repeated interviews. The record at [facility] showed that she had frequent ED visit due to similar symptoms or chest pains."     Admission to [facility] in 06/2022 for chest pain and hypertensive urgency. CT chest and then MRA chest done to rule out aortic dissection negative. Prior multiple MRI and MRA chest in Dec/2023 negative. MRI brain and MRA head&neck in Dec/2023 negative for stroke. "     On this admission patient's labs were fairly unremarkable, some leukocytosis upon admission but that has now resolved, no renal or hepatic impairment, lipid panel showed total cholesterol 165, LDL 116, triglyceride 94, HDL 30.  Troponin negative x 2, ECG without any ischemic changes, UA unremarkable, no signs of infection, some mild thrombocytopenia which should be followed up on outpatient with her PCP.     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  Chest x-ray unremarkable.  She  is also evaluated by cardiology who thought her chest pain was atypical and likely not cardiac, however recommended outpatient stress test.     It is suspected she has some degree of sleep apnea, she has been requiring 2 L nasal cannula while asleep, and was recommended to follow-up with sleep medicine specialist.    Otherwise she is hemodynamically stable and ready for discharge, with outpatient cardiology and neurology follow-up, she is presumed to have Bell's palsy and  will be discharged home on Valtrex and prednisone taper.  She should follow-up with her PCP within 1 week.        Minutes spent coordinating discharge and reviewing discharge plan:40 minutes  Today:   BP 123/62   Pulse 72   Temp 98.1 F (36.7 C) (Oral)   Resp 18   Ht 1.778 m (5\' 10" )   Wt 147 kg (324 lb 1.2 oz)   LMP 10/15/2018 (Exact Date)   SpO2 92%   BMI 46.50 kg/m   Ranges for the last 24 hours:  Temp:  [97.5 F (36.4 C)-98.1 F (36.7 C)] 98.1 F (36.7 C)  Heart Rate:  [50-72] 72  Resp Rate:  [14-22] 18  BP: (117-142)/(56-91) 123/62    General: WD female  in no acute distress,  Cardiovascular: regular rate and rhythm, no murmurs  Lungs: clear to auscultation bilaterally, no additional sounds  Abdomen: soft, non-tender, non-distended; normoactive bowel sounds  Extremities: no edema  Neurological: Alert and oriented X 3, moves all extremities. 4/5 strength RUE/RLE. 5/5 strength LUE/LLE. Right facial droop. + right sided pronator drift.   Skin:no rash or lesion     Procedures performed:   Radiology: all results from this admission  MRI Brain WO Contrast    Result Date: 09/09/2022   1.No acute intracranial abnormality is detected. Otho Ket, DO 09/09/2022 11:32 AM    CT Head without Contrast    Result Date: 09/09/2022   No evidence of acute intracranial abnormality. [name], MD 09/09/2022 8:05 AM    XR Chest AP Portable    Result Date: 09/08/2022   No acute intrathoracic abnormality. Campbell Stall 09/08/2022 10:52 PM    CT Head WO Contrast    Result Date: 09/08/2022   1.Normal examination. [name], MD 09/08/2022 10:33 PM   Surgery: all results from this admission  * No surgery found *  Lab Results last 48 Hours       Procedure Component Value Units Date/Time    Lipid Panel [[id number]]  (Abnormal) Collected: 09/10/22 0519    Specimen: Blood, Venous Updated: 09/10/22 0923     Cholesterol 165 mg/dL      Triglycerides 94 mg/dL      HDL 30 mg/dL      LDL Calculated 409 mg/dL      VLDL Calculated  19 mg/dL      Cholesterol / HDL Ratio 5.5 Index     CBC with Differential [[id number]]  (Abnormal) Collected: 09/10/22 0519    Specimen: Blood, Venous Updated: 09/10/22 9562    Narrative:      The following orders were created for panel order CBC with Differential.  Procedure                               Abnormality         Status                     ---------                               -----------         ------  CBC with Differential[[id number]]        Abnormal            Final result                 Please view results for these tests on the individual orders.    CBC with Differential [[id number]]  (Abnormal) Collected: 09/10/22 0519    Specimen: Blood, Venous Updated: 09/10/22 0605     WBC 7.53 x10 3/uL      Hemoglobin 13.1 g/dL      Hematocrit 40.9 %      Platelet Count 123 x10 3/uL      MPV 13.9 fL      RBC 4.15 x10 6/uL      MCV 97.1 fL      MCH 31.6 pg      MCHC 32.5 g/dL      RDW 13 %      nRBC % 0.0 /100 WBC      Absolute nRBC 0.00 x10 3/uL      Preliminary Absolute Neutrophil Count 4.86 x10 3/uL      Neutrophils % 64.6 %      Lymphocytes % 24.7 %      Monocytes % 7.7 %      Eosinophils % 2.5 %      Basophils % 0.4 %      Immature Granulocytes % 0.1 %      Absolute Neutrophils 4.86 x10 3/uL      Absolute Lymphocytes 1.86 x10 3/uL      Absolute Monocytes 0.58 x10 3/uL      Absolute Eosinophils 0.19 x10 3/uL      Absolute Basophils 0.03 x10 3/uL      Absolute Immature Granulocytes 0.01 x10 3/uL     Magnesium [[id number]]  (Normal) Collected: 09/10/22 0519    Specimen: Blood, Venous Updated: 09/10/22 0601     Magnesium 2.2 mg/dL     Basic Metabolic Panel [[id number]]  (Abnormal) Collected: 09/10/22 0519    Specimen: Blood, Venous Updated: 09/10/22 0601     Glucose 111 mg/dL      BUN 13 mg/dL      Creatinine 0.9 mg/dL      Calcium 8.7 mg/dL      Sodium 578 mEq/L      Potassium 4.0 mEq/L      Chloride 109 mEq/L      CO2 25 mEq/L      Anion Gap 7.0     GFR >60.0 mL/min/1.73 m2     Hemoglobin  A1C [[id number]]  (Abnormal) Collected: 09/09/22 0425    Specimen: Blood, Venous Updated: 09/10/22 0134     Hemoglobin A1C 5.7 %      Average Estimated Glucose 116.9 mg/dL     High Sensitivity Troponin-I [[id number]]  (Normal) Collected: 09/09/22 0901    Specimen: Blood, Venous Updated: 09/09/22 1043     hs Troponin 3.9 ng/L     CBC with Differential [[id number]]  (Abnormal) Collected: 09/09/22 0425    Specimen: Blood, Venous Updated: 09/09/22 0537    Narrative:      The following orders were created for panel order CBC with Differential.  Procedure                               Abnormality         Status                     ---------                               -----------         ------  CBC with Differential[[id number]]        Abnormal            Final result                 Please view results for these tests on the individual orders.    CBC with Differential [[id number]]  (Abnormal) Collected: 09/09/22 0425    Specimen: Blood, Venous Updated: 09/09/22 0537     WBC 8.28 x10 3/uL      Hemoglobin 13.4 g/dL      Hematocrit 47.8 %      Platelet Count 125 x10 3/uL      MPV 13.9 fL      RBC 4.29 x10 6/uL      MCV 95.8 fL      MCH 31.2 pg      MCHC 32.6 g/dL      RDW 13 %      nRBC % 0.0 /100 WBC      Absolute nRBC 0.00 x10 3/uL      Preliminary Absolute Neutrophil Count 5.32 x10 3/uL      Neutrophils % 64.2 %      Lymphocytes % 27.1 %      Monocytes % 5.9 %      Eosinophils % 1.9 %      Basophils % 0.5 %      Immature Granulocytes % 0.4 %      Absolute Neutrophils 5.32 x10 3/uL      Absolute Lymphocytes 2.24 x10 3/uL      Absolute Monocytes 0.49 x10 3/uL      Absolute Eosinophils 0.16 x10 3/uL      Absolute Basophils 0.04 x10 3/uL      Absolute Immature Granulocytes 0.03 x10 3/uL     Comprehensive Metabolic Panel [[id number]]  (Abnormal) Collected: 09/09/22 0425    Specimen: Blood, Venous Updated: 09/09/22 0515     Glucose 120 mg/dL      BUN 11 mg/dL      Creatinine 0.8 mg/dL      Sodium 657 mEq/L       Potassium 3.5 mEq/L      Chloride 108 mEq/L      CO2 26 mEq/L      Calcium 8.7 mg/dL      Anion Gap 9.0     GFR >60.0 mL/min/1.73 m2      AST (SGOT) 22 U/L      ALT 24 U/L      Alkaline Phosphatase 110 U/L      Albumin 3.7 g/dL      Protein, Total 7.7 g/dL      Globulin 4.0 g/dL      Albumin/Globulin Ratio 0.9     Bilirubin, Total 1.0 mg/dL     Urinalysis with Reflex to Microscopic Exam and Culture [[id number]]  (Abnormal) Collected: 09/09/22 0128    Specimen: Urine, Clean Catch Updated: 09/09/22 0300    Narrative:      The following orders were created for panel order Urinalysis with Reflex to Microscopic Exam and Culture.  Procedure                               Abnormality         Status                     ---------                               -----------         ------  Urinalysis with Reflex t.Marland KitchenMarland Kitchen[[id number]]  Abnormal            Final result               Urine [employer] .Marland KitchenMarland Kitchen[[id number]]                      Final result                 Please view results for these tests on the individual orders.    Urine [employer] Tube [[id number]] Collected: 09/09/22 0128    Specimen: Urine, Clean Catch Updated: 09/09/22 0300     Extra Tube Hold for add-ons.    High Sensitivity Troponin-I at 2 hrs with calculated Delta [[id number]] Collected: 09/09/22 0112    Specimen: Blood, Venous Updated: 09/09/22 0155     hs Troponin 4.2 ng/L      hs Troponin-I Delta 0    Urinalysis with Reflex to Microscopic Exam and Culture [[id number]]  (Abnormal) Collected: 09/09/22 0128    Specimen: Urine, Clean Catch Updated: 09/09/22 0142     Urine Color Straw     Urine Clarity Clear     Urine Specific Gravity 1.021     Urine pH 6.0     Urine Leukocyte Esterase Negative     Urine Nitrite Negative     Urine Protein Negative     Urine Glucose Negative     Urine Ketones Trace mg/dL      Urine Urobilinogen Normal mg/dL      Urine Bilirubin Negative     Urine Blood Negative    Serum Beta HCG Qualitative [[id number]]   (Normal) Collected: 09/08/22 2323    Specimen: Blood, Venous Updated: 09/09/22 0026     hCG Qualitative Negative    High Sensitivity Troponin-I at 0 hrs [[id number]]  (Normal) Collected: 09/08/22 2323    Specimen: Blood, Venous Updated: 09/09/22 0014     hs Troponin 3.9 ng/L     Basic Metabolic Panel [[id number]]  (Normal) Collected: 09/08/22 2323    Specimen: Blood, Venous Updated: 09/09/22 0011     Glucose 87 mg/dL      BUN 13 mg/dL      Creatinine 0.9 mg/dL      Calcium 9.1 mg/dL      Sodium 756 mEq/L      Potassium 3.8 mEq/L      Chloride 109 mEq/L      CO2 24 mEq/L      Anion Gap 10.0     GFR >60.0 mL/min/1.73 m2     Hepatic Function Panel (LFT) [[id number]]  (Abnormal) Collected: 09/08/22 2323    Specimen: Blood, Venous Updated: 09/09/22 0011     Bilirubin, Total 0.8 mg/dL      Bilirubin Direct 0.2 mg/dL      Bilirubin Indirect 0.6 mg/dL      AST (SGOT) 21 U/L      ALT 23 U/L      Alkaline Phosphatase 108 U/L      Albumin 3.7 g/dL      Globulin 3.9 g/dL      Albumin/Globulin Ratio 0.9     Protein, Total 7.6 g/dL     Magnesium [[id number]]  (Normal) Collected: 09/08/22 2323    Specimen: Blood, Venous Updated: 09/09/22 0011     Magnesium 2.0 mg/dL     CBC with Differential [[id number]]  (Abnormal) Collected: 09/08/22 2257    Specimen: Blood, Venous Updated: 09/08/22 2328    Narrative:  The following orders were created for panel order CBC with Differential.  Procedure                               Abnormality         Status                     ---------                               -----------         ------                     CBC with Differential[[id number]]        Abnormal            Final result                 Please view results for these tests on the individual orders.    CBC with Differential [[id number]]  (Abnormal) Collected: 09/08/22 2257    Specimen: Blood, Venous Updated: 09/08/22 2328     WBC 9.69 x10 3/uL      Hemoglobin 13.7 g/dL      Hematocrit 40.9 %      Platelet Count 137 x10 3/uL      MPV  13.8 fL      RBC 4.43 x10 6/uL      MCV 93.9 fL      MCH 30.9 pg      MCHC 32.9 g/dL      RDW 13 %      nRBC % 0.0 /100 WBC      Absolute nRBC 0.00 x10 3/uL      Preliminary Absolute Neutrophil Count 5.81 x10 3/uL      Neutrophils % 60.0 %      Lymphocytes % 30.2 %      Monocytes % 7.3 %      Eosinophils % 1.9 %      Basophils % 0.4 %      Immature Granulocytes % 0.2 %      Absolute Neutrophils 5.81 x10 3/uL      Absolute Lymphocytes 2.93 x10 3/uL      Absolute Monocytes 0.71 x10 3/uL      Absolute Eosinophils 0.18 x10 3/uL      Absolute Basophils 0.04 x10 3/uL      Absolute Immature Granulocytes 0.02 x10 3/uL             Treatment Team:   Attending Provider: Corky Sing, MD  Consulting Physician: Stephens Shire, MD    Disposition:   Disposition: home with family    Condition at Discharge:   good     Follow up Recommendations for Receiving Provider   Diet- Adult diet Therapeutic/ Modified; Solid; Regular (IDDSI level 7); Thin (IDDSI level 0)    Follow-up with PCP for repeat of your platelet count, please have them refer you to a sleep medicine specialist as you may have sleep apnea and need to be evaluated with a sleep study and possible CPAP prescription.  Please continue your Valtrex and prednisone taper, please follow-up with neurology outpatient and cardiology for an outpatient stress test.  You should be taking daily aspirin 81 mg to prevent future strokes, given your risk factors. Nsaids are listed as an allergy, but you had 2 doses of  aspirin here in the hospital and tolerated well.   Unresulted Labs       None            Discharge Instructions:        Medication List        START taking these medications      aspirin 81 MG chewable tablet  Chew 1 tablet (81 mg) by mouth daily  Start taking on: September 11, 2022     predniSONE 20 MG tablet  Commonly known as: DELTASONE  Take 3 tablets (60 mg) by mouth every morning with breakfast for 4 days, THEN 2.5 tablets (50 mg) every morning with breakfast for 1 day, THEN  2 tablets (40 mg) every morning with breakfast for 1 day, THEN 1.5 tablets (30 mg) every morning with breakfast for 1 day, THEN 1 tablet (20 mg) every morning with breakfast for 1 day, THEN 0.5 tablets (10 mg) every morning with breakfast for 1 day.  Start taking on: September 10, 2022            CONTINUE taking these medications      alum & mag hydroxide-simethicone 200-200-20 mg/5 mL suspension  Commonly known as: MAALOX PLUS  Take 30 mLs by mouth every 4 (four) hours as needed for Indigestion     atorvastatin 40 MG tablet  Commonly known as: LIPITOR  Take 1 tablet (40 mg total) by mouth daily     famotidine 20 MG tablet  Commonly known as: PEPCID  Take 1 tablet (20 mg) by mouth every 12 (twelve) hours     hydrALAZINE 25 MG tablet  Commonly known as: APRESOLINE     hydroCHLOROthiazide 25 MG tablet  Commonly known as: HYDRODIURIL     KEPPRA PO     lisinopril 10 MG tablet  Commonly known as: ZESTRIL     ondansetron 4 MG disintegrating tablet  Commonly known as: ZOFRAN-ODT  Take 1 tablet (4 mg) by mouth every 8 (eight) hours as needed for Nausea     valACYclovir 1000 MG tablet  Commonly known as: VALTREX  Take 1 tablet (1,000 mg) by mouth 3 (three) times daily for 9 days               Where to Get Your Medications        These medications were sent to [id number] - [place] - [address]  [address], [place] [address]      Phone: [phone]   aspirin 81 MG chewable tablet  predniSONE 20 MG tablet  valACYclovir 1000 MG tablet         Signed by: Charlott Rakes, NP

## 2022-09-10 NOTE — Nursing Progress Note (Signed)
Pt requesting pain medication prior to d/c. NP ordered PO dilaudid as pt is being d/c'd. Pt states "I would rather suffer". RN stated "I will come back in and ask again to see if you changed your mind". Pt stated "I wont".     NP made aware.

## 2022-09-18 NOTE — ED Provider Notes (Signed)
Patient Evaluated: 09/18/2022 - 15:58 - [facility] ED 02    Means of arrival:Car        CC / HPI                                                                                                                                                                           Chief Complaint from Triage Note:   "Patient presents with:  Pain, Chest  C/o pain to chest and L jaw constant since last night, denies injury, admits to SOB,  denies cough/congestion."      HPI:  [name] Cook is a 47 y.o. female who presents with chest pain.  Patient states that she woke up at 11:30 last night to go to the bathroom, felt like her chest was on fire.  She points to the midline of her chest as the location of her pain.  It is not worse with exertion, position, or anything else.  She has not eaten since breakfast yesterday morning due to loss of appetite, has not been drinking much fluids but has been tolerating them.  She did not try anything for relief.  No known sick contacts.  Denies fever, chills, sore throat, nasal congestion, rhinorrhea, cough, abdominal pain, nausea, vomiting, diarrhea, dysuria, leg/calf pain/swelling, or other complaints.       Relevant Past Medical, Social and Family History - See resident note (if applicable) for additional historical information   Past Medical / Surgical History:   Past Medical History:   Diagnosis Date   . Cancer (CMS/HCC)    . Cervical cancer (CMS/HCC)     s/p TAH by Dr. Hillary Bow   . CVA (cerebral vascular accident) (CMS/HCC) 2015    After HTN emergency; recovered    . HLD (hyperlipidemia)    . HTN (hypertension)    . Hypertension 12/15/1992   . Morbid obesity with BMI of 40.0-44.9, adult (CMS/HCC)    . Seizure disorder (CMS/HCC)    . 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 TIA (transient ischemic attack)       Past Surgical History:   Procedure Laterality Date   . COLPORRHAPHY N/A 06/08/2020    POSTERIOR REPAIR performed by Harvest Forest, MD at [facility] MAIN OR   . CYSTOSCOPY N/A  06/22/2020    CYSTOSCOPY performed by Harvest Forest, MD at [facility] MAIN OR   . CYSTOSCOPY N/A 08/04/2020    SLING REMOVAL,CYSTOSCOPY performed by Harvest Forest, MD at [facility] MAIN OR   . CYSTOSCOPY BIOPSY BLADDER N/A 06/22/2020    CYSTOSCOPY BIOPSY BLADDER performed by Harvest Forest, MD at [facility] MAIN OR   . ENTERSCOPY N/A 05/17/2020    PUSH ENTEROSCOPY  performed by Epifania Gore, MD at [facility] ENDO OR   . ESOPHAGOGASTRODUODENOSCOPY Bilateral 05/17/2020    ESOPHAGOGASTRODUODENOSCOPY performed by Epifania Gore, MD at [facility] ENDO OR   . HX APPENDECTOMY  01/29/18   . HX HYSTERECTOMY     . HYSTERECTOMY ABDOMINAL  2021   . PERINEOPLASTY N/A 06/08/2020    PERINEOPLASTY performed by Harvest Forest, MD at [facility] MAIN OR   . REMOVAL / REVISION OF SLING FOR STRESS INCONTINENCE N/A 06/22/2020     EXAMINATION UNDER ANESTHESIA   performed by Harvest Forest, MD at [facility] MAIN OR   . SLING PUBOVAGINAL N/A 06/08/2020    SLING VAGINAL WITH ALTIS performed by Harvest Forest, MD at [facility] MAIN OR     Family History: family history includes Breast Cancer in her sister; Cervical Cancer in her sister; Diabetes mellitus in her father and mother; Hypertension in her father and mother.  Social Hx:  reports that she has never smoked. She has never used smokeless tobacco. She reports that she does not currently use alcohol. She reports that she does not use drugs.   Current Outpatient Medications on File Prior to Encounter   Medication Last Dose   . aspirin 325 MG EC tablet    . atorvastatin (LIPITOR) 80 MG tablet    . levETIRAcetam (KEPPRA) 500 MG tablet    . lisinopril (ZESTRIL) 20 MG tablet      ALLERGIES: Acetaminophen, Butalbital-apap-caffeine, Cyclobenzaprine, Hydrocodone-acetaminophen, Ibuprofen, Iodinated Contrast - Iv Or Oral, Iodinated Contrast Media, Ketorolac Tromethamine, Nsaids, Oxycodone-acetaminophen, Shellfish-derived Products, Tramadol, Ceftriaxone,  Ciprofloxacin, Dicyclomine, Egg-derived Products, Metoclopramide, Promethazine      Review of Systems - See HPI and resident note (if applicable)  for additional ROS Information   No LMP recorded (lmp unknown). Patient has had a hysterectomy.  Review of Systems      Physical Exam     LAST  VS BP: (!) 175/111 Pulse: 68 (09/18/22 1326)  Heart Rate (Monitored): (not recorded) Resp: 16  SpO2: 98 % Temp:  (na)   FIRST  VS BP: (!) 182/99 Pulse: 68 (09/18/22 1326)  Heart Rate (Monitored): (not recorded) Resp: 16  SpO2: 99 % Temp: 36.7 C (98.1 F)   SHOCK INDEX BASED ON LATEST VITAL SIGNS (PULSE/SBP): .38  **These vitals reflect the first/last taken during this encounter. Within each group, they are not necessarily taken at the same time.    Physical Exam  Vitals and nursing note reviewed.   Constitutional:       Appearance: Normal appearance.      Comments: + mild distress; hoarse voice, occasional sniffling and throat clearing   HENT:      Head: Normocephalic and atraumatic.   Eyes:      Extraocular Movements: Extraocular movements intact.      Pupils: Pupils are equal, round, and reactive to light.   Cardiovascular:      Rate and Rhythm: Normal rate.      Comments: Normal perfusion  Pulmonary:      Effort: Pulmonary effort is normal. No respiratory distress.   Chest:      Chest wall: No tenderness.   Abdominal:      General: There is no distension.      Palpations: Abdomen is soft.      Tenderness: There is no abdominal tenderness.   Musculoskeletal:         General: No deformity. Normal range of motion.      Cervical back: Normal range of motion and neck  supple.      Right lower leg: No edema.      Left lower leg: No edema.      Comments: No calf tenderness   Skin:     General: Skin is warm and dry.   Neurological:      Mental Status: She is alert and oriented to person, place, and time.      Comments: Normal speech; moving all extremities   Psychiatric:         Mood and Affect: Mood normal.         Behavior: Behavior  normal.       O2 sat 98% on room air, normal     Diagnostic Studies   ECG Interpretation   The following is my interpretation of the ECG  read at: 09/18/2022 3:00 PM  Rate: 69  Rhythm: sinus rhythm   Axis:  normal  Intervals: QTc 482    -prolonged QTc interval    ST segment: normal   Other findings: Nonspecific T waves  Comparison to prior:    Comments: EP interpretation: Prolonged QT, no acute infarct or ischemia    Results for orders placed or performed during the hospital encounter of 09/18/22   CBC with Auto Diff   Result Value Ref Range    WBC 9.9 4.5 - 10.6 10*3/uL    RBC 4.61 3.80 - 5.10 10    Hemoglobin 14.0 12.0 - 14.0 g/dL    Hematocrit 16.1 09.6 - 47.0 %    MCV 93.7 80.0 - 99.0 fL    MCH 30.4 26.0 - 33.0 pg    MCHC 32.4 32.0 - 35.0 g/dL    Platelets 045 (L) 409 - 475 10*3/uL    MPV 14.3 11.0 - 15.0 fL    RDW 13 10 - 13 %   Comprehensive Metabolic Panel   Result Value Ref Range    Sodium 140 136 - 145 mmol/L    Potassium 4.1 3.5 - 5.1 mmol/L    Chloride 104 98 - 107 mmol/L    CO2 Total 26 22 - 29 mmol/L    Glucose Bld 91 70 - 100 mg/dL    Creatinine 0.9 0.7 - 1.2 mg/dL    BUN 9 6 - 23 mg/dL    Calcium 9.1 8.6 - 81.1 mg/dL    Total Protein 8.3 6.6 - 8.7 g/dL    Albumin 4.2 3.5 - 5.2 g/dL    Bilirubin Total 0.9 0.1 - 1.0 mg/dL    Alk Phos 914 (H) 35 - 104 unit/L    ALT 24 5 - 33 U/L    Anion Gap 10 8 - 18 mmol/L    AST 27 5 - 32 U/L   Magnesium   Result Value Ref Range    Magnesium 2.1 1.7 - 2.6 mg/dL   Troponin T 5th Gen, High Sensitivity   Result Value Ref Range    Troponin T 5th Gen, High Sensitivity <6 <=14 ng/L   Estimated GFR   Result Value Ref Range    EGFR Result 80 >=60   Automated Diff Confirmation   Result Value Ref Range    Neutros % 71.0 %    Lymphs % 16.0 %    Monos % 11.0 %    Basophil % 1.0 %    Atypical Lymphs 1.0 (H) <=0.0 %    RBC Morphology Normal     Plt Morphology Normal     Platelet Estimate Decreased (A)     Neutros Abs  7.03 1.80 - 7.90 10*3/uL    Absolute Lymphocytes 1.58 1.10 - 4.20  10*3/uL    Absolute Monocytes 1.09 (H) 0.00 - 1.00 10*3/uL    Absolute Basophils 0.10 0.00 - 0.21 10*3/uL   SARS CoV-2 (COVID-19) with Influenza A, B and RSV, NAA   Result Value Ref Range    Resp Virus PCR Source NaspSwab     Patient Symptomatic? Yes     Hospitalized? No     ICU? No     Pregnant? Not Pregnant     SARS-CoV-2 (COVID-19 PCR) RNA Not Detected Not Detected    Influenza A Not Detected Not Detected    Influenza B Not Detected Not Detected    Respiratory Syncytial Virus Not Detected Not Detected     I independently reviewed the above laboratory studies and when relevant, compared with previous values if available.    XR Chest 2 Views    Result Date: 09/18/2022  XR CHEST 2 VIEWS ----------------------- INDICATION:  chest pain COMPARISON: PA and lateral views of the chest are compared to a previous study dated 07/17/2022. -----------------------    IMPRESSION: ----------------------- The cardiac and mediastinal silhouettes are within normal limits. No focal consolidation or pleural abnormality observed. No pneumothorax. Remaining osseous and soft tissue structures are grossly unremarkable.        X-ray(s) interpreted by me: NAD      Progress Notes, Medical Decision Making and Critical Care      [facility] HEART SCORE   History:  Slightly suspicious (0 points)  ECG:  Normal (0 points)  Patient Age:  65 - 64 years (1 point)  Risk Factors:  1-2 risk factors (1 point)  Initial Troponin:  Within normal limits (0 points)   Score:  2  Interpretation: Score 0-3 : 0.9-1.7% risk of adverse cardiac event. In the HEART Score, these patients were discharged. (0.99% retrospective) (1.7% prospective)         I feel pulmonary embolism, aortic dissection, ACS, acute CHF, foreign body, pneumothorax, and pneumonia all clinically unlikely.  and I feel the D-dimer test is unnecessary by [facility] rule:  Age < 50, HR < 100,  no  room air SaO2 < 94%, no prior hx DVT/PE, no recent trauma/surgery, no hemoptysis, no exogenous estrogens, no clinical  signs suggesting DVT.  Since all of  these are negative, d-dimer is not necessary to clinically exclude DVT/PE.       Prescription Medications Considered :  Pepcid was considered but not prescribed as patient reported no improvement after taking Pepcid in the ED    Diagnostic Tests Considered : CTA considered. However, the patient is PERC negative. Therefore, the risk of radiation & false positive outweighs the very low risk of PE.    Medications given in the ED:   Medications   aluminum-magnesium hydroxide-simethicone (MYLANTA) 200-200-20 MG/5ML suspension 30 mL (30 mLs Oral Not Given 09/18/22 1600)   acetaminophen (TYLENOL) tablet 1,000 mg (1,000 mg Oral Not Given 09/18/22 1600)   PB-Hyoscy-Atropine-Scopolamine (DONNATAL) elixir 5 mL (5 mLs Oral Not Given 09/18/22 1845)   lidocaine (XYLOCAINE) 2 % viscous solution 10 mL (10 mLs Oral Not Given 09/18/22 1845)   sucralfate (CARAFATE) tablet 1 g (has no administration in time range)   famotidine (PEPCID) tablet 20 mg (20 mg Oral Given 09/18/22 1620)   albuterol 2.5 mg/2mL nebulizer solution 2.5 mg (2.5 mg Inhalation Given 09/18/22 1741)    , Medications prescribed in the ED:   Discharge Medication List as of 09/18/2022  6:56 PM       , Discharge instructions as provided to patient:   Written and Verbal Discharge Instructions         Mylanta or Maalox as needed for upset stomach  Avoid spicy, greasy, fatty, or fried foods  Do not lie down for 30-60 minutes after eating  Follow up with your primary care doctor in the next couple of days for further evaluation and treatment  Return to ED for severe/worsening pain or shortness of breath despite treatment, vomiting and inability to keep down fluids, passing out, or other worrisome symptoms      17:30 -- met w/ pt to discuss results, plan; pt not willing to try Tylenol, Maalox or Mylanta available; pt denies any improvement with treatment so far; will try albuterol  18:20 -- pt denies improvement with albuterol; will try alternative GI  cocktail  18:50 -- per RN pt requesting to be discharged    47 year old female c/o burning sensation in the midline of her chest, stating that it felt like her chest was on fire after getting out of bed at 11:30 PM yesterday.  Patient developed loss of appetite yesterday, has not eaten since yesterday morning.  No history of GERD.  Patient has Tylenol allergy listed, previously has tolerated it for notes in the allergy section, but patient unwilling to try it today; no Maalox or Mylanta available; patient denied relief with Pepcid.  Albuterol neb given without improvement.  Despite patient denying congestion or rhinorrhea, patient was noted to have sniffling, hoarseness, and throat clearing upon evaluation consistent with viral URI or allergic rhinitis.  EKG without acute infarct or ischemia.  Labs unremarkable including normal troponin.  PCR negative for COVID-19, flu, or RSV.  CXR NAD.  Uncertain cause of patient's chest pain, possibly GERD; unable to rule out ACS; not concerning for acute MI, pneumonia, PE, or sepsis.  Patient did not obtain relief but wanted to be discharged to follow-up with her PMD.  Stable for discharge home.       Clinical Impression and Disposition    Diagnosis:  1. Acute chest pain Acute     ED Disposition: Discharge - 09/18/2022 18:54  Condition at time of disposition: Stable                    Bettey Mare. Eppie Gibson, MD  09/18/22 2026

## 2022-10-08 NOTE — ED Provider Notes (Signed)
Patient Evaluated: 10/08/2022 - 18:17 - [facility]    Means of arrival:Walk        CC / HPI                                                                                                                                                                           Chief Complaint from Triage Note:   "Patient presents with:  Pain, Chest  Shortness Of Breath: H/O COVID 3 weeks ago  H/O COVID 3 weeks ago---for the past 2 days Dizzy, CP and SOB"      HPI:  [name] Cook is a 47 y.o. female who presents with 2 days of chest pain associated with cough.     Pt has been having these symptoms for 3 weeks. She was initially seen at [facility] with benign labs and respiratory swab. 3 days later was seen at [facility] ER on 7/11 where respiratory swab showed Covid. Pt additionally had a VQ negative for PE (pt with iv dye allergy and chronically elevated Ddimer).    She states she had felt improved until symptoms returned again 2 days prior with cough, myalgias. Today she was coughing a lot in the bathroom and her husband advised her she should return to the ER.              Relevant Past Medical, Social and Family History - See resident note (if applicable) for additional historical information   Past Medical / Surgical History:   Past Medical History:   Diagnosis Date   . Cancer (CMS/HCC)    . Cervical cancer (CMS/HCC)     s/p TAH by Dr. Hillary Bow   . CVA (cerebral vascular accident) (CMS/HCC) 2015    After HTN emergency; recovered    . HLD (hyperlipidemia)    . HTN (hypertension)    . Hypertension 12/15/1992   . Morbid obesity with BMI of 40.0-44.9, adult (CMS/HCC)    . Seizure disorder (CMS/HCC)    . 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 TIA (transient ischemic attack)       Past Surgical History:   Procedure Laterality Date   . COLPORRHAPHY N/A 06/08/2020    POSTERIOR REPAIR performed by Harvest Forest, MD at [facility] MAIN OR   . CYSTOSCOPY N/A 06/22/2020    CYSTOSCOPY performed by Harvest Forest, MD at [facility] MAIN OR   . CYSTOSCOPY N/A 08/04/2020    SLING REMOVAL,CYSTOSCOPY performed by Harvest Forest, MD at [facility] MAIN OR   . CYSTOSCOPY BIOPSY BLADDER N/A 06/22/2020    CYSTOSCOPY BIOPSY BLADDER performed by Harvest Forest, MD at [facility] MAIN OR   . ENTERSCOPY N/A  05/17/2020    PUSH ENTEROSCOPY performed by Epifania Gore, MD at [facility] ENDO OR   . ESOPHAGOGASTRODUODENOSCOPY Bilateral 05/17/2020    ESOPHAGOGASTRODUODENOSCOPY performed by Epifania Gore, MD at [facility] ENDO OR   . HX APPENDECTOMY  01/29/18   . HX HYSTERECTOMY     . HYSTERECTOMY ABDOMINAL  2021   . PERINEOPLASTY N/A 06/08/2020    PERINEOPLASTY performed by Harvest Forest, MD at [facility] MAIN OR   . REMOVAL / REVISION OF SLING FOR STRESS INCONTINENCE N/A 06/22/2020     EXAMINATION UNDER ANESTHESIA   performed by Harvest Forest, MD at [facility] MAIN OR   . SLING PUBOVAGINAL N/A 06/08/2020    SLING VAGINAL WITH ALTIS performed by Harvest Forest, MD at [facility] MAIN OR     Family History: family history includes Breast Cancer in her sister; Cervical Cancer in her sister; Diabetes mellitus in her father and mother; Hypertension in her father and mother.  Social Hx:  reports that she has never smoked. She has never used smokeless tobacco. She reports that she does not currently use alcohol. She reports that she does not use drugs.   Current Outpatient Medications on File Prior to Encounter   Medication Last Dose   . aspirin 325 MG EC tablet    . atorvastatin (LIPITOR) 80 MG tablet    . levETIRAcetam (KEPPRA) 500 MG tablet    . lisinopril (ZESTRIL) 20 MG tablet      ALLERGIES: Acetaminophen, Butalbital-apap-caffeine, Cyclobenzaprine, Hydrocodone-acetaminophen, Ibuprofen, Iodinated Contrast - Iv Or Oral, Iodinated Contrast Media, Ketorolac Tromethamine, Nsaids, Oxycodone-acetaminophen, Shellfish-derived Products, Tramadol, Ceftriaxone, Ciprofloxacin, Dicyclomine, Egg-derived Products,  Metoclopramide, Promethazine      Review of Systems - See HPI and resident note (if applicable)  for additional ROS Information   No LMP recorded (lmp unknown). Patient has had a hysterectomy.  Review of Systems   All other systems reviewed and are negative.        Physical Exam     LAST  VS BP: (!) 143/93 Pulse: 72 (10/08/22 1809)  Heart Rate (Monitored): (not recorded) Resp: 18  SpO2: 99 % Temp: 37.1 C (98.8 F)   FIRST  VS BP: (!) 183/109 Pulse: 76 (10/08/22 1734)  Heart Rate (Monitored): (not recorded) Resp: 18  SpO2: 97 % Temp: 37.1 C (98.8 F)   SHOCK INDEX BASED ON LATEST VITAL SIGNS (PULSE/SBP): .50  **These vitals reflect the first/last taken during this encounter. Within each group, they are not necessarily taken at the same time.    Physical Exam  Vitals and nursing note reviewed.   Constitutional:       General: She is not in acute distress.     Appearance: Normal appearance. She is not ill-appearing, toxic-appearing or diaphoretic.      Comments: Euvolemic, nad, no respiratory distress, ambulatory from triage without dyspnea   HENT:      Head: Normocephalic and atraumatic.      Mouth/Throat:      Mouth: Mucous membranes are moist.   Eyes:      Extraocular Movements: Extraocular movements intact.   Cardiovascular:      Rate and Rhythm: Normal rate.      Pulses: Normal pulses.   Pulmonary:      Breath sounds: Normal breath sounds. No stridor. No wheezing or rales.   Abdominal:      General: Abdomen is flat. Bowel sounds are normal. There is no distension.      Palpations: Abdomen is soft.  Tenderness: There is no abdominal tenderness. There is no guarding or rebound.   Musculoskeletal:         General: Normal range of motion.      Cervical back: Normal range of motion.      Right lower leg: No edema.      Left lower leg: No edema.   Skin:     General: Skin is warm and dry.      Capillary Refill: Capillary refill takes less than 2 seconds.   Neurological:      General: No focal deficit present.       Mental Status: She is alert and oriented to person, place, and time. Mental status is at baseline.      Gait: Gait normal.   Psychiatric:         Mood and Affect: Mood normal.         Behavior: Behavior normal.         Thought Content: Thought content normal.            Diagnostic Studies   ECG Interpretation  Results for orders placed or performed during the hospital encounter of 10/08/22   SARS CoV-2 (COVID-19) with Influenza A, B and RSV, NAA   Result Value Ref Range    Resp Virus PCR Source NaspSwab     SARS-CoV-2 (COVID-19 PCR) RNA Not Detected Not Detected    Influenza A Not Detected Not Detected    Influenza B Not Detected Not Detected    Respiratory Syncytial Virus Not Detected Not Detected    Patient Symptomatic? Yes     Hospitalized? No     ICU? No     Pregnant? Not Pregnant    EKG 12 Lead Chest Pain   Result Value Ref Range    Ventricular Rate 75 BPM    Atrial Rate 75 BPM    P-R Interval 148 ms    QRS Duration 86 ms    Q-T Interval 426 ms    QTC Calculation (Bazett) 475 ms    P Axis 41 degrees    R Axis 8 degrees    T Axis -5 degrees          XR Chest 2 Views    Result Date: 10/08/2022  INDICATION:  Recent Covid-19 infection.  Cough. TECHNIQUE:  Frontal and lateral chest. COMPARISON:  CXR 09/18/2022 FINDINGS:  The cardiomediastinal silhouette is normal in size.  There is no consolidation or atelectasis in either lung.  There are no pleural effusions.  There is no pneumothorax.  No acute osseous process.      IMPRESSION: No acute cardiopulmonary abnormality. Thank you for this referral to [facility] [employer] in partnership with [facility].  For further consultation, please call [phone] or [phone] (phones answered 24/7/365).  Electronically Signed by Francina Ames, MD 10/08/2022 8:11 PM             Progress Notes, Medical Decision Making and Critical Care        Not clinically septic; appears well.  Feel home observation safe and reasonable.  Feel symptoms  similar to established pattern; feel pulmonary embolism, ACS, acute CHF, foreign body, pneumothorax, and pneumonia all clinically unlikely.  At or near baseline; feel condition and comorbidity pattern makes home observation safe and reasonable; feel likelihood of deterioration is low.  Able to walk without significant dyspnea.  Has access to outpatient medications.      ED Course as of 10/08/22 2128  Wynelle Link Oct 08, 2022   1908 EKG: 12 Lead ECG: , -- RATE: 75, -- RHYTHM:, normal sinus rhythm, --INTERPRETATIONS:, TWI III/ F, no ST changes  [AA]   2016 SARS CoV-2 (COVID-19) with Influenza A, B and RSV, NAA:    Resp Virus PCR Source NaspSwab   SARS-CoV-2 (COVID-19) RNA Not Detected   Influenza A Not Detected   Influenza B Not Detected   Respiratory Syncytial Virus Not Detected   Patient Symptomatic? Yes   Hospitalized? No   ICU? No   Pregnant? Not Pregnant  negative [AA]   2016 XR Chest 2 Views  negative [AA]      ED Course User Index  [AA] Wendie Chess, MD                                                     Clinical Impression and Disposition    Diagnosis:  1. Acute bronchitis with bronchospasm      ED Disposition: Discharge - 10/08/2022 21:06  Condition at time of disposition: Stable                    Wendie Chess, MD  10/08/22 2129

## 2022-10-30 ENCOUNTER — Observation Stay: Payer: No Typology Code available for payment source

## 2022-10-30 ENCOUNTER — Other Ambulatory Visit: Payer: Self-pay

## 2022-10-30 ENCOUNTER — Inpatient Hospital Stay
Admission: EM | Admit: 2022-10-30 | Discharge: 2022-11-05 | DRG: 048 | Disposition: A | Discharge status: Home / Self Care | Payer: No Typology Code available for payment source | Attending: Internal Medicine | Admitting: Internal Medicine

## 2022-10-30 ENCOUNTER — Emergency Department: Payer: No Typology Code available for payment source

## 2022-10-30 DIAGNOSIS — Z8673 Personal history of transient ischemic attack (TIA), and cerebral infarction without residual deficits: Secondary | ICD-10-CM

## 2022-10-30 DIAGNOSIS — I639 Cerebral infarction, unspecified: Secondary | ICD-10-CM

## 2022-10-30 DIAGNOSIS — R569 Unspecified convulsions: Secondary | ICD-10-CM | POA: Diagnosis present

## 2022-10-30 DIAGNOSIS — J9811 Atelectasis: Secondary | ICD-10-CM | POA: Diagnosis present

## 2022-10-30 DIAGNOSIS — R2990 Unspecified symptoms and signs involving the nervous system: Secondary | ICD-10-CM | POA: Insufficient documentation

## 2022-10-30 DIAGNOSIS — G510 Bell's palsy: Principal | ICD-10-CM | POA: Diagnosis present

## 2022-10-30 DIAGNOSIS — R7303 Prediabetes: Secondary | ICD-10-CM | POA: Diagnosis present

## 2022-10-30 DIAGNOSIS — Z6841 Body Mass Index (BMI) 40.0 and over, adult: Secondary | ICD-10-CM

## 2022-10-30 DIAGNOSIS — G43909 Migraine, unspecified, not intractable, without status migrainosus: Secondary | ICD-10-CM | POA: Diagnosis present

## 2022-10-30 DIAGNOSIS — I200 Unstable angina: Secondary | ICD-10-CM

## 2022-10-30 DIAGNOSIS — Z79899 Other long term (current) drug therapy: Secondary | ICD-10-CM

## 2022-10-30 DIAGNOSIS — E785 Hyperlipidemia, unspecified: Secondary | ICD-10-CM | POA: Diagnosis present

## 2022-10-30 DIAGNOSIS — Z7982 Long term (current) use of aspirin: Secondary | ICD-10-CM

## 2022-10-30 DIAGNOSIS — Z86718 Personal history of other venous thrombosis and embolism: Secondary | ICD-10-CM

## 2022-10-30 DIAGNOSIS — R531 Weakness: Secondary | ICD-10-CM | POA: Diagnosis present

## 2022-10-30 DIAGNOSIS — R29810 Facial weakness: Secondary | ICD-10-CM | POA: Diagnosis present

## 2022-10-30 DIAGNOSIS — E876 Hypokalemia: Secondary | ICD-10-CM | POA: Diagnosis present

## 2022-10-30 DIAGNOSIS — I10 Essential (primary) hypertension: Secondary | ICD-10-CM | POA: Diagnosis present

## 2022-10-30 DIAGNOSIS — R079 Chest pain, unspecified: Secondary | ICD-10-CM | POA: Diagnosis present

## 2022-10-30 DIAGNOSIS — G40909 Epilepsy, unspecified, not intractable, without status epilepticus: Secondary | ICD-10-CM | POA: Diagnosis present

## 2022-10-30 DIAGNOSIS — I619 Nontraumatic intracerebral hemorrhage, unspecified: Secondary | ICD-10-CM | POA: Diagnosis present

## 2022-10-30 LAB — HIGH SENSITIVITY TROPONIN-I WITH DELTA
hs Troponin-I Delta: 1
hs Troponin: 4.4 ng/L (ref <=14.0)

## 2022-10-30 LAB — ECG 12-LEAD
Atrial Rate: 65 {beats}/min
Atrial Rate: 66 {beats}/min
IHS MUSE NARRATIVE AND IMPRESSION: NORMAL
IHS MUSE NARRATIVE AND IMPRESSION: NORMAL
P Axis: 36 degrees
P-R Interval: 138 ms
P-R Interval: 166 ms
Q-T Interval: 404 ms
Q-T Interval: 466 ms
QRS Duration: 84 ms
QRS Duration: 86 ms
QTC Calculation (Bezet): 420 ms
QTC Calculation (Bezet): 488 ms
R Axis: 13 degrees
R Axis: 30 degrees
T Axis: -24 degrees
Ventricular Rate: 65 {beats}/min
Ventricular Rate: 66 {beats}/min

## 2022-10-30 LAB — LAB USE ONLY - CBC WITH DIFFERENTIAL
Absolute Basophils: 0.06 10*3/uL (ref 0.00–0.08)
Absolute Eosinophils: 0.23 10*3/uL (ref 0.00–0.44)
Absolute Immature Granulocytes: 0.03 10*3/uL (ref 0.00–0.07)
Absolute Lymphocytes: 3.92 10*3/uL — ABNORMAL HIGH (ref 0.42–3.22)
Absolute Monocytes: 0.76 10*3/uL (ref 0.21–0.85)
Absolute Neutrophils: 5.47 10*3/uL (ref 1.10–6.33)
Absolute nRBC: 0 10*3/uL (ref <=0.00)
Basophils %: 0.6 %
Eosinophils %: 2.2 %
Hematocrit: 41.6 % (ref 34.7–43.7)
Hemoglobin: 13.9 g/dL (ref 11.4–14.8)
Immature Granulocytes %: 0.3 %
Lymphocytes %: 37.4 %
MCH: 31.8 pg (ref 25.1–33.5)
MCHC: 33.4 g/dL (ref 31.5–35.8)
MCV: 95.2 fL (ref 78.0–96.0)
MPV: 13.1 fL — ABNORMAL HIGH (ref 8.9–12.5)
Monocytes %: 7.3 %
Neutrophils %: 52.2 %
Platelet Count: 158 10*3/uL (ref 142–346)
Preliminary Absolute Neutrophil Count: 5.47 10*3/uL (ref 1.10–6.33)
RBC: 4.37 10*6/uL (ref 3.90–5.10)
RDW: 13 % (ref 11–15)
WBC: 10.47 10*3/uL — ABNORMAL HIGH (ref 3.10–9.50)
nRBC %: 0 /100 WBC (ref <=0.0)

## 2022-10-30 LAB — COMPREHENSIVE METABOLIC PANEL
ALT: 23 U/L (ref 0–55)
AST (SGOT): 22 U/L (ref 5–41)
Albumin/Globulin Ratio: 1 (ref 0.9–2.2)
Albumin: 4 g/dL (ref 3.5–5.0)
Alkaline Phosphatase: 122 U/L — ABNORMAL HIGH (ref 37–117)
Anion Gap: 6 (ref 5.0–15.0)
BUN: 12 mg/dL (ref 7–21)
Bilirubin, Total: 0.9 mg/dL (ref 0.2–1.2)
CO2: 29 mEq/L (ref 17–29)
Calcium: 9.8 mg/dL (ref 8.5–10.5)
Chloride: 110 mEq/L (ref 99–111)
Creatinine: 0.9 mg/dL (ref 0.4–1.0)
GFR: >60 mL/min/{1.73_m2} (ref >=60.0)
Globulin: 4.2 g/dL — ABNORMAL HIGH (ref 2.0–3.6)
Glucose: 62 mg/dL — ABNORMAL LOW (ref 70–100)
Potassium: 3.3 mEq/L — ABNORMAL LOW (ref 3.5–5.3)
Protein, Total: 8.2 g/dL (ref 6.0–8.3)
Sodium: 145 mEq/L (ref 135–145)

## 2022-10-30 LAB — NT-PROBNP: NT-ProBNP: 46 pg/mL (ref <125)

## 2022-10-30 LAB — MAGNESIUM: Magnesium: 2.2 mg/dL (ref 1.6–2.6)

## 2022-10-30 LAB — WHOLE BLOOD GLUCOSE POCT
Whole Blood Glucose POCT: 73 mg/dL (ref 70–100)
Whole Blood Glucose POCT: 107 mg/dL — ABNORMAL HIGH (ref 70–100)

## 2022-10-30 LAB — HIGH SENSITIVITY TROPONIN-I: hs Troponin: 3.2 ng/L (ref <=14.0)

## 2022-10-30 LAB — PHOSPHORUS: Phosphorus: 2.6 mg/dL (ref 2.3–4.7)

## 2022-10-30 MED ORDER — HEPARIN SODIUM (PORCINE) 5000 UNIT/ML IJ SOLN
5000.0000 [IU] | Freq: Three times a day (TID) | INTRAMUSCULAR | Status: DC
Start: 2022-10-30 — End: 2022-11-05
  Administered 2022-10-30 – 2022-11-05 (×14): 5000 [IU] via SUBCUTANEOUS
  Filled 2022-10-30 (×14): qty 1

## 2022-10-30 MED ORDER — INSULIN LISPRO 100 UNIT/ML SOLN (WRAP)
1.0000 [IU] | Freq: Every evening | Status: DC
Start: 2022-10-30 — End: 2022-11-05
  Administered 2022-11-02 – 2022-11-04 (×2): 1 [IU] via SUBCUTANEOUS
  Filled 2022-10-30 (×2): qty 3

## 2022-10-30 MED ORDER — ACETAMINOPHEN 325 MG PO TABS
650.0000 mg | ORAL_TABLET | ORAL | Status: DC | PRN
Start: 2022-10-30 — End: 2022-10-30

## 2022-10-30 MED ORDER — ASPIRIN 300 MG RE SUPP
300.0000 mg | Freq: Every day | RECTAL | Status: DC
Start: 2022-10-30 — End: 2022-11-05

## 2022-10-30 MED ORDER — ONDANSETRON HCL 4 MG/2ML IJ SOLN
4.0000 mg | Freq: Once | INTRAMUSCULAR | Status: DC
Start: 2022-10-30 — End: 2022-11-05
  Filled 2022-10-30: qty 2

## 2022-10-30 MED ORDER — POTASSIUM CHLORIDE 10 MEQ/100ML IV SOLN (WRAP)
10.0000 meq | INTRAVENOUS | Status: AC
Start: 2022-10-30 — End: 2022-10-30
  Administered 2022-10-30 (×2): 10 meq via INTRAVENOUS
  Filled 2022-10-30 (×2): qty 100

## 2022-10-30 MED ORDER — DEXTROSE 10 % IV BOLUS
12.5000 g | INTRAVENOUS | Status: DC | PRN
Start: 2022-10-30 — End: 2022-11-05

## 2022-10-30 MED ORDER — GLUCAGON 1 MG IJ SOLR (WRAP)
1.0000 mg | INTRAMUSCULAR | Status: DC | PRN
Start: 2022-10-30 — End: 2022-11-05

## 2022-10-30 MED ORDER — ATORVASTATIN CALCIUM 40 MG PO TABS
40.0000 mg | ORAL_TABLET | Freq: Every evening | ORAL | Status: DC
Start: 2022-10-30 — End: 2022-11-05
  Administered 2022-10-30 – 2022-11-04 (×6): 40 mg via ORAL
  Filled 2022-10-30: qty 4
  Filled 2022-10-30 (×5): qty 1

## 2022-10-30 MED ORDER — HYDROMORPHONE HCL 1 MG/ML IJ SOLN
1.0000 mg | Freq: Once | INTRAMUSCULAR | Status: AC
Start: 2022-10-30 — End: 2022-10-30
  Administered 2022-10-30: 1 mg via INTRAVENOUS
  Filled 2022-10-30: qty 1

## 2022-10-30 MED ORDER — INSULIN LISPRO 100 UNIT/ML SOLN (WRAP)
1.0000 [IU] | Freq: Three times a day (TID) | Status: DC
Start: 2022-10-30 — End: 2022-11-05
  Administered 2022-11-01 – 2022-11-04 (×2): 2 [IU] via SUBCUTANEOUS
  Filled 2022-10-30 (×2): qty 6

## 2022-10-30 MED ORDER — ONDANSETRON HCL 4 MG/2ML IJ SOLN
4.0000 mg | Freq: Four times a day (QID) | INTRAMUSCULAR | Status: DC | PRN
Start: 2022-10-30 — End: 2022-11-05

## 2022-10-30 MED ORDER — LABETALOL HCL 5 MG/ML IV SOLN (WRAP)
10.0000 mg | INTRAVENOUS | Status: DC | PRN
Start: 2022-10-30 — End: 2022-11-05
  Administered 2022-10-30 – 2022-11-02 (×3): 10 mg via INTRAVENOUS
  Filled 2022-10-30 (×3): qty 4

## 2022-10-30 MED ORDER — POTASSIUM CHLORIDE CRYS ER 20 MEQ PO TBCR
20.0000 meq | EXTENDED_RELEASE_TABLET | Freq: Once | ORAL | Status: AC
Start: 2022-10-30 — End: 2022-10-30
  Administered 2022-10-30: 20 meq via ORAL
  Filled 2022-10-30: qty 1

## 2022-10-30 MED ORDER — KCL IN DEXTROSE-NACL 20-5-0.45 MEQ/L-%-% IV SOLN
INTRAVENOUS | Status: DC
Start: 2022-10-30 — End: 2022-11-01

## 2022-10-30 MED ORDER — MIDAZOLAM HCL 1 MG/ML IJ SOLN (WRAP)
2.0000 mg | Freq: Once | INTRAMUSCULAR | Status: AC
Start: 2022-10-30 — End: 2022-10-30
  Administered 2022-10-30: 2 mg via INTRAVENOUS
  Filled 2022-10-30: qty 2

## 2022-10-30 MED ORDER — GLUCOSE 40 % PO GEL (WRAP)
15.0000 g | ORAL | Status: DC | PRN
Start: 2022-10-30 — End: 2022-11-05

## 2022-10-30 MED ORDER — ACETAMINOPHEN 650 MG RE SUPP
650.0000 mg | RECTAL | Status: DC | PRN
Start: 2022-10-30 — End: 2022-10-30

## 2022-10-30 MED ORDER — ONDANSETRON 4 MG PO TBDP
4.0000 mg | ORAL_TABLET | Freq: Four times a day (QID) | ORAL | Status: DC | PRN
Start: 2022-10-30 — End: 2022-11-05

## 2022-10-30 MED ORDER — DEXTROSE 50 % IV SOLN
12.5000 g | INTRAVENOUS | Status: DC | PRN
Start: 2022-10-30 — End: 2022-11-05

## 2022-10-30 MED ORDER — HYDRALAZINE HCL 20 MG/ML IJ SOLN
10.0000 mg | INTRAMUSCULAR | Status: DC | PRN
Start: 2022-10-30 — End: 2022-11-05
  Administered 2022-10-30: 10 mg via INTRAVENOUS
  Filled 2022-10-30: qty 1

## 2022-10-30 MED ORDER — ASPIRIN 81 MG PO CHEW
81.0000 mg | CHEWABLE_TABLET | Freq: Every day | ORAL | Status: DC
Start: 2022-10-30 — End: 2022-11-05
  Administered 2022-10-31 – 2022-11-04 (×4): 81 mg via ORAL
  Filled 2022-10-30 (×5): qty 1

## 2022-10-30 MED ORDER — LEVETIRACETAM 500 MG PO TABS
500.0000 mg | ORAL_TABLET | Freq: Two times a day (BID) | ORAL | Status: DC
Start: 2022-10-30 — End: 2022-11-05
  Administered 2022-10-30 – 2022-11-04 (×11): 500 mg via ORAL
  Filled 2022-10-30 (×9): qty 1
  Filled 2022-10-30: qty 2
  Filled 2022-10-30 (×2): qty 1

## 2022-10-30 NOTE — ED Notes (Signed)
Patient does not initially want the IV Potassium Chloride as it is painful as she said. I informed her that I will give it slower and with NSS alongside it. She agreed to it.

## 2022-10-30 NOTE — H&P (Signed)
ADMISSION HISTORY AND PHYSICAL EXAM    [facility], DIVISION OF HOSPITALIST MEDICINE   [facility]   [place] Pager: [phone]      Date Time: 10/30/22 6:19 PM  Patient Name: [name] Cook  Attending Physician: Eunice Blase, MD  Primary Care Physician: Unknown Foley, NP    CC: chest pain    History Gathered From: Self and EMS/ED notes.    Assessment and Plan :     Active Hospital Problems    Diagnosis    CVA (cerebrovascular accident due to intracerebral hemorrhage)    Pre-diabetes    Acute hypokalemia    Right sided weakness    HTN (hypertension)    HLD (hyperlipidemia)    History of stroke    Morbid obesity with BMI of 45.0-49.9, adult    Mouth droop due to facial weakness    Seizures    Chest pain     - Admit to [employer] service, observation status  - TIA/CVA order set/pathway  - Neuro checks q4hrs  - MRI Brain   - MRA Brain and Neck  - 2D-Echo  - Tele monitor  - Hold bp meds to allow for permissive htn  - prn iv meds with parameters for blood pressure  - Neurology consulted by me  - PT/OT/SLP evals  - NPO till patient passes bedside swallow eval  - ASA and High Intensity Statin therapy  - Check Hgaic and fasting lipids  - dvt prophy with sq heparin   - check fs with ssi coverage  - cardiology consulted by ed provider  - monitor further chest pain now resolved on tele and trend troponin  - replace potassium and check mag, phos levels  - c/w keppra for sz d/o      Patient has BMI=Body mass index is 46.91 kg/m.  Diagnosis: Obesity Class 3 (formerly known as Morbid Obesity) based on BMI criteria          Recent Labs     10/30/22  1411   Potassium 3.3*     Diagnosis: Hypokalemia     VTE Prophylaxis-Medication VTE Prophylaxis Orders: heparin (porcine) injection 5,000 Units  Mechanical VTE Prophylaxis Orders: Maintain sequential compression device  Nutrition:Diet NPO effective now  Supervise For Meals Frequency: All meals    Code status: Full code     Status/Disposition:   Pt is  admitted under OBSERVATION with above concerns.    Anticipated medical stability for discharge: 24 Hrs     History of Presenting Illness:   [name] Cook is a 47 y.o. female with a past medical history of right cva w/o residual deficits, htn, hld, pre-dm presents for evaluation of chest pain associated with shortness of breath that she describes as substernal nonradiating and is going on for a few days currently resolved. She denies leg swelling or recent periods of travel or history of DVT. ED noticed on arrival by nursing staff that she has a right-sided facial droop but patient has no certainty of when this began. On my exam she also has right sided weakness that again patient is uncertain when it began but according to the ed provider not present when he examined her 4-5 hours ago.    Past Medical History:     Past Medical History:   Diagnosis Date    Hyperlipidemia     Hypertension     Migraine     Morbid obesity     Palpitations     loop recorder placed  in 2019, had to be removed after a month due to misplacement and keloid formation per patient    Seizures     Stroke     2015    Thoracic aortic aneurysm     TIA (transient ischemic attack) 2017     Available old records reviewed, including: EPIC     Past Surgical History:     Past Surgical History:   Procedure Laterality Date    APPENDECTOMY (OPEN)      CHOLECYSTECTOMY      EGD, BIOPSY N/A 04/16/2017    Procedure: EGD, BIOPSY;  Surgeon: Pershing Proud, MD;  Location: [facility];  Service: Gastroenterology;  Laterality: N/A;    HYSTERECTOMY       Family History:     Family History   Problem Relation Age of Onset    Myocardial Infarction Father 3    Deep vein thrombosis Father     No known problems Mother      Social History:    reports that she has never smoked. She has never used smokeless tobacco. She reports that she does not drink alcohol and does not use drugs.    Allergies:   Allergies[1]    Medications:     Home Medications       Med List Status:  In Progress Set By: Renard Hamper, RN at 10/30/2022  3:09 PM              atorvastatin (LIPITOR) 40 MG tablet     Take 1 tablet (40 mg total) by mouth daily     hydrALAZINE (APRESOLINE) 25 MG tablet     Take 1 tablet (25 mg) by mouth 3 (three) times daily     hydroCHLOROthiazide (HYDRODIURIL) 25 MG tablet     Take 1 tablet (25 mg) by mouth daily     LevETIRAcetam (KEPPRA PO)     Take 500 mg by mouth 2 (two) times daily.        lisinopril (ZESTRIL) 10 MG tablet     Take 1 tablet (10 mg) by mouth daily          Flagged for Removal               alum & mag hydroxide-simethicone (MAALOX PLUS) 200-200-20 mg/5 mL suspension     Take 30 mLs by mouth every 4 (four) hours as needed for Indigestion     famotidine (PEPCID) 20 MG tablet     Take 1 tablet (20 mg) by mouth every 12 (twelve) hours     ondansetron (ZOFRAN-ODT) 4 MG disintegrating tablet     Take 1 tablet (4 mg) by mouth every 8 (eight) hours as needed for Nausea          Method by which medications were confirmed on admission: emr    Review of Systems:   All other systems were reviewed and are negative except:as above in HPI     Physical Exam:   Patient Vitals for the past 24 hrs:   BP Temp Temp src Pulse Resp SpO2 Height Weight   10/30/22 1730 189/84 -- -- (!) 55 20 98 % -- --   10/30/22 1700 179/84 -- -- (!) 56 16 98 % -- --   10/30/22 1630 166/79 -- -- (!) 55 16 96 % -- --   10/30/22 1531 172/80 -- -- 62 19 94 % -- --   10/30/22 1511 177/89 -- -- 66 14 -- -- --   10/30/22 1327 Marland Kitchen)  178/105 98.3 F (36.8 C) Oral 67 19 97 % 1.778 m (5\' 10" ) (!) 148.3 kg (326 lb 15.1 oz)     Body mass index is 46.91 kg/m.  No intake or output data in the 24 hours ending 10/30/22 1819    General: WD female in no acute distress.  HEENT: perrla, eomi, sclera anicteric  oropharynx clear without lesions, mucous membranes moist  Neck: supple  Cardiovascular: Normal S1 and S2, no murmurs, rubs or gallops  Lungs: clear to auscultation bilaterally, without wheezing, rhonchi, or  rales  Abdomen: soft, non-tender, non-distended; normoactive bowel sounds, no rebound or guarding  Extremities: no edema , pulses palpable   Neuro: alert, oriented x 3, cranial nerves grossly intact except left facial droop but able to close her eyes and raise her eyebrow, strength 5/5 in LEFT upper and lower extremities but 4/5 in RUE/RLE, sensation intact, no cerebellar deficits identified  Skin: no rashes or lesions noted    Labs:     Results       Procedure Component Value Units Date/Time    Whole Blood Glucose POCT [[id number]]  (Normal) Collected: 10/30/22 1602    Specimen: Blood, Capillary Updated: 10/30/22 1604     Whole Blood Glucose POCT 73 mg/dL     NT-ProBNP [[id number]]  (Normal) Collected: 10/30/22 1411    Specimen: Blood, Venous Updated: 10/30/22 1533     NT-ProBNP 46 pg/mL     High Sensitivity Troponin-I [[id number]]  (Normal) Collected: 10/30/22 1411    Specimen: Blood, Venous Updated: 10/30/22 1500     hs Troponin 3.2 ng/L     Comprehensive Metabolic Panel [[id number]]  (Abnormal) Collected: 10/30/22 1411    Specimen: Blood, Venous Updated: 10/30/22 1452     Glucose 62 mg/dL      BUN 12 mg/dL      Creatinine 0.9 mg/dL      Sodium 176 mEq/L      Potassium 3.3 mEq/L      Chloride 110 mEq/L      CO2 29 mEq/L      Calcium 9.8 mg/dL      Anion Gap 6.0     GFR >60.0 mL/min/1.73 m2      AST (SGOT) 22 U/L      ALT 23 U/L      Alkaline Phosphatase 122 U/L      Albumin 4.0 g/dL      Protein, Total 8.2 g/dL      Globulin 4.2 g/dL      Albumin/Globulin Ratio 1.0     Bilirubin, Total 0.9 mg/dL     CBC with Differential (Order) [[id number]]  (Abnormal) Collected: 10/30/22 1411    Specimen: Blood, Venous Updated: 10/30/22 1451    Narrative:      The following orders were created for panel order CBC with Differential (Order).  Procedure                               Abnormality         Status                     ---------                               -----------         ------  CBC with Differential  (C.Marland KitchenMarland Kitchen[[id number]]  Abnormal            Final result                 Please view results for these tests on the individual orders.    CBC with Differential (Component) [[id number]]  (Abnormal) Collected: 10/30/22 1411    Specimen: Blood, Venous Updated: 10/30/22 1451     WBC 10.47 x10 3/uL      Hemoglobin 13.9 g/dL      Hematocrit 78.2 %      Platelet Count 158 x10 3/uL      MPV 13.1 fL      RBC 4.37 x10 6/uL      MCV 95.2 fL      MCH 31.8 pg      MCHC 33.4 g/dL      RDW 13 %      nRBC % 0.0 /100 WBC      Absolute nRBC 0.00 x10 3/uL      Preliminary Absolute Neutrophil Count 5.47 x10 3/uL      Neutrophils % 52.2 %      Lymphocytes % 37.4 %      Monocytes % 7.3 %      Eosinophils % 2.2 %      Basophils % 0.6 %      Immature Granulocytes % 0.3 %      Absolute Neutrophils 5.47 x10 3/uL      Absolute Lymphocytes 3.92 x10 3/uL      Absolute Monocytes 0.76 x10 3/uL      Absolute Eosinophils 0.23 x10 3/uL      Absolute Basophils 0.06 x10 3/uL      Absolute Immature Granulocytes 0.03 x10 3/uL            EKG: nsr with no acute changes    Imaging personally reviewed, including: all available   CT Head WO Contrast    Result Date: 10/30/2022   No acute intracranial abnormality. [name], MD 10/30/2022 4:04 PM    XR Chest PA Or AP    Result Date: 10/30/2022   Small foci of basilar atelectasis. [name], MD 10/30/2022 2:44 PM     This note was generated by the Epic EMR system/ Dragon speech recognition and may contain inherent errors or omissions not intended by the user. Grammatical errors, random word insertions, deletions and pronoun errors  are occasional consequences of this technology due to software limitations. Not all errors are caught or corrected. If there are questions or concerns about the content of this note or information contained within the body of this dictation they should be addressed directly with the author for clarification.    Signed by: Eunice Blase, MD, MD   NF:[id number], Stanton Kidney, NP        [1]   Allergies  Allergen Reactions    Contrast [Iodinated Contrast Media] Anaphylaxis     Patient woke up in ICU after having CT with contrast, last remembers being in CT    Fentanyl Anaphylaxis     Throat swelling    Fioricet [Butalbital-Apap-Caffeine] Hives    Flexeril [Cyclobenzaprine] Edema     Causes throat swelling per Pt report    Motrin [Ibuprofen] Anaphylaxis     Throat swelling    Tramadol Anaphylaxis     Throat swelling    Bentyl [Dicyclomine] Edema    Percocet [Oxycodone-Acetaminophen] Hives    Shellfish-Derived Products Hives    Toradol [Ketorolac Tromethamine] Hives  Tylenol [Acetaminophen] Hives

## 2022-10-30 NOTE — Plan of Care (Signed)
C/o mid sternal chest pain , notified by RN    BP (!) 174/121   Pulse 76   Temp 97.7 F (36.5 C) (Oral)   Resp 17   Ht 1.778 m (5\' 10" )   Wt (!) 148.3 kg (326 lb 15.1 oz)   LMP 10/15/2018 (Exact Date)   SpO2 97%   BMI 46.91 kg/m      Briefly,  admitted chest pain and R/O stroke/TIA , presented with Right facial droop and right side weakness . MRI brain result pending , CTA head and neck negative for LVO     Patient examined at bedside. C/o severe mid sternal chest pain radiating to left arm, jaws and to the back. Denies chest pain on deep breathing. Declined lidocaine patch due to ineffectiveness.    - Stat EKG did not reveal any changes then previously. CXR with mild basilar atelectasis. Troponin negative x 2    - dilaudid 1 mg IV x 1  - incentive spirometer, encourage to use   - cardiology consult in am     [facility]

## 2022-10-30 NOTE — ED to IP RN Note (Signed)
[facility] EMERGENCY DEPARTMENT  ED NURSING NOTE FOR THE RECEIVING INPATIENT NURSE   ED NURSE Velora Mediate [id number]   ED CHARGE RN Mega   ADMISSION INFORMATION   [name] Cook is a 47 y.o. female admitted with an ED diagnosis of:    1. CVA (cerebrovascular accident due to intracerebral hemorrhage)         Isolation: None   Allergies: Contrast [iodinated contrast media], Fentanyl, Fioricet [butalbital-apap-caffeine], Flexeril [cyclobenzaprine], Motrin [ibuprofen], Tramadol, Bentyl [dicyclomine], Percocet [oxycodone-acetaminophen], Shellfish-derived products, Toradol [ketorolac tromethamine], and Tylenol [acetaminophen]   Holding Orders confirmed? Yes   Belongings Documented? Yes   Home medications sent to pharmacy confirmed? No   NURSING CARE   Patient Comes From:   Mental Status: Home Independent  alert and oriented   ADL: Independent with all ADLs   Ambulation: no difficulty   Pertinent Information  and Safety Concerns:     Broset Violence Risk Level: Low Patient came to ED for chest pain. RN noticed facial droop that patient reports is new. Patient not sure when it started. Denies numbness, tingling, or weakness. Facial droop has gotten worse since arrival.      CT / NIH   CT Head ordered on this patient?  Yes   NIH/Dysphagia assessment done prior to admission? Yes   VITAL SIGNS (at the time of this note)      Vitals:    10/30/22 1730   BP: 189/84   Pulse: (!) 55   Resp: 20   Temp:    SpO2: 98%     Pain Score: 8-severe pain (10/30/22 1511)

## 2022-10-30 NOTE — Progress Notes (Signed)
Pharmacist signing off on medication history completed by medication reconciliation technician.  Medications have been updated below and in Epic to the best of our ability based off of interview with patient. Information may be incorrect or incomplete if reported as such.  [address] RPH      Coral Spikes 10/30/2022 18:27      Admission Medication History - ADVANCED        Patient's Pharmacy Information:     Primary Pharmacy Updated in Epic: Yes  Preferred pharmacy:   [id number] - [place] - [address]  [address]  [place] [address]  Phone: [phone] Fax: [phone]        Notes: none     Primary Source of Medication History: Pharmacy and Self        Prior to Admission Medication List:  Home Medications         Med List Status: Pharmacy Completed Set By: Coral Spikes at 10/30/2022  6:23 PM                 amLODIPine (NORVASC) 5 MG tablet       Take 1 tablet (5 mg) by mouth daily      aspirin EC 325 MG tablet       Take 1 tablet (325 mg) by mouth daily      atorvastatin (LIPITOR) 80 MG tablet       Take 1 tablet (80 mg) by mouth daily      hydroCHLOROthiazide (HYDRODIURIL) 25 MG tablet       Take 1 tablet (25 mg) by mouth daily      LevETIRAcetam (KEPPRA PO)       Take 500 mg by mouth 2 (two) times daily.          lisinopril (ZESTRIL) 20 MG tablet       Take 1 tablet (20 mg) by mouth daily                     Patient taking differently:                                          Summary:     The following medication changes were documented in the prior to admission medication list:  The following medications were changed (reason if applicable):  Lisinopril from 10 mg to 20 mg   Atorvastatin- from 40 mg to 80  The following medications were removed (reason if applicable):  Zofran  Hydralazine  Maalox  pepcid  The following medications were added:  Amlodipine  Aspirin      Patient demonstrates Good compliance with medications.  Patient demonstrates Good knowledge  of medications.      Medication reconciliation by physician has not occurred already.   Additional Comments:  Pt reported being taken off plavix by PCP     Pt reported no other OTCs     [employer]

## 2022-10-30 NOTE — ED Notes (Signed)
Dr Stephanie Acre came, EKG given to her.

## 2022-10-30 NOTE — ED Provider Notes (Addendum)
EMERGENCY DEPARTMENT NOTE     Patient initially seen and examined at   ED PHYSICIAN ASSIGNED       Date/Time Event User Comments    10/30/22 1416 Physician Assigned Lyndell Allaire P Yvone Neu, MD assigned as Attending           ED MIDLEVEL (APP) ASSIGNED       None            HISTORY OF PRESENT ILLNESS       Chief Complaint: Chest Pain and Shortness of Breath       47 y.o. female with past medical history as below here for evaluation o of chest pain and shortness of breath that she describes as substernal nonradiating and is going on for a few days, no leg swelling or recent periods of travel or history of DVT.  Has a history of stroke and TIA with no residual symptoms.  Noticed on arrival by nursing staff that she has a right-sided facial droop, patient has no certainty of when this began.    Independent Historian (other than patient):     MEDICAL HISTORY     Past Medical History:  Past Medical History:   Diagnosis Date    Hyperlipidemia     Hypertension     Migraine     Morbid obesity     Palpitations     loop recorder placed in 2019, had to be removed after a month due to misplacement and keloid formation per patient    Seizures     Stroke     2015    Thoracic aortic aneurysm     TIA (transient ischemic attack) 2017       Past Surgical History:  Past Surgical History:   Procedure Laterality Date    APPENDECTOMY (OPEN)      CHOLECYSTECTOMY      EGD, BIOPSY N/A 04/16/2017    Procedure: EGD, BIOPSY;  Surgeon: Pershing Proud, MD;  Location: [facility];  Service: Gastroenterology;  Laterality: N/A;    HYSTERECTOMY         Social History:  Social History[1]    Family History:  Family History   Problem Relation Age of Onset    Myocardial Infarction Father 45    Deep vein thrombosis Father     No known problems Mother        Outpatient Medication:  Previous Medications    ALUM & MAG HYDROXIDE-SIMETHICONE (MAALOX PLUS) 200-200-20 MG/5 ML SUSPENSION    Take 30 mLs by mouth every 4 (four) hours as needed for  Indigestion    ATORVASTATIN (LIPITOR) 40 MG TABLET    Take 1 tablet (40 mg total) by mouth daily    FAMOTIDINE (PEPCID) 20 MG TABLET    Take 1 tablet (20 mg) by mouth every 12 (twelve) hours    HYDRALAZINE (APRESOLINE) 25 MG TABLET    Take 1 tablet (25 mg) by mouth 3 (three) times daily    HYDROCHLOROTHIAZIDE (HYDRODIURIL) 25 MG TABLET    Take 1 tablet (25 mg) by mouth daily    LEVETIRACETAM (KEPPRA PO)    Take 500 mg by mouth 2 (two) times daily.       LISINOPRIL (ZESTRIL) 10 MG TABLET    Take 1 tablet (10 mg) by mouth daily    ONDANSETRON (ZOFRAN-ODT) 4 MG DISINTEGRATING TABLET    Take 1 tablet (4 mg) by mouth every 8 (eight) hours as needed for Nausea  REVIEW OF SYSTEMS   Review of Systems  12 point comprehensive review of systems is negative for any additional symptoms or complaints  PHYSICAL EXAM     ED Triage Vitals [10/30/22 1327]   Encounter Vitals Group      BP (!) 178/105      Systolic BP Percentile       Diastolic BP Percentile       Heart Rate 67      Resp Rate 19      Temp 98.3 F (36.8 C)      Temp src Oral      SpO2 97 %      Weight (!) 148.3 kg      Height 1.778 m      Head Circumference       Peak Flow       Pain Score 8      Pain Loc       Pain Education       Exclude from Growth Chart        Nursing note and vitals reviewed.    Constitutional: Vital signs reviewed. Well appearing.  Playing games on her cell phone.  Head: Normocephalic, atraumatic  Eyes: Conjunctiva and sclera are normal.  No injection or discharge.  Ears, Nose, Throat:  Normal external examination of the nose and ears.    Neck: Normal range of motion. Trachea midline.  Respiratory/Chest: Clear to auscultation. No respiratory distress.   Cardiovascular: Regular rate and rhythm. No murmurs.   Abdomen:  No rebound or guarding. Soft.  Non-tender.  Back:    Upper Extremity:  No edema. No cyanosis.  Lower Extremity:  No edema. No cyanosis.  Skin: Warm and dry. No rash.  Psychiatric:  Normal affect.  Normal insight.  Neuro:   Alert and conversant.  Normal mental status.  Pupils equal round, reactive to light.  EOMI.  Face showing right lower facial droop, speech fluid.  No drift.  Strength 5/5 UE and LE bilaterally.  Normal finger-nose-finger.        MEDICAL DECISION MAKING     PRIMARY PROBLEM LIST     Acute illness/injury DIAGNOSIS: Strokelike symptoms      DISCUSSION    Patient not a TNK candidate due to unknown time of last known well and does not appear to be disabling symptoms.      Patient presents for evaluation of symptoms described above, emergency partner screen exams revealed no acute abnormality outside of mild hypokalemia which I do not believe is clinically relevant to patient's symptoms however was repleted.  Concern for strokelike symptoms with negative head CT and so we will observation overnight for continued monitoring and evaluation at this time.            Evaluation                   NIH Stroke Score      Flowsheet Row Most Recent Value   NIH Stroke Scale    Interval Baseline admission to ED   1a. Level of Consciousness 0   1b. LOC Questions (age, month) 0   1c. LOC Commands (Open and close eyes, Grip AND release good hand) 0   2. Best Gaze 0   3. Visual Fields 0   4. Facial Palsy 1   5a. Motor Left Arm: (Arms with palm down X 10 seconds. Sitting = arms at 90 degrees. Supine = arms 45 degrees) 0   5b. Motor Right Arm: (Arms  with palm down X 10 seconds. Sitting = arms at 90 degrees Supine = arms 45 degrees) 0   Total Motor Arms 0   6a. Motor Left Leg: (Leg elevated X 5 seconds Supine = Leg 30 degrees) 0   6b. Motor Right Leg: (Leg elevated X 5 seconds Supine = Leg 30 degrees) 0   Total Motor Legs 0   7. Limb Ataxia (Finger to nose, heel to shin) 0   8. Sensory (Sensation or grimace to pin prick on face, arm, trunk, and leg) 0   9. Best language (Describe picture, name items, read sentences from NIHSS booklet) 0   10. Dysarthria (Read list from NIHSS booklet) 0   11. Extinction and Inattention (formerly Neglect) -  Test tactile and visual stimulation 0   NIHSS Total 1            Vital Signs: Reviewed the patient's vital signs.   Nursing Notes: Reviewed and utilized available nursing notes.  Medical Records Reviewed: Reviewed available past medical records.  Counseling: The emergency provider has spoken with the patient and discussed today's findings, in addition to providing specific details for the plan of care.  Questions are answered and there is agreement with the plan.      MIPS DOCUMENTATION                EMERGENCY DEPT. MEDICATIONS      ED Medication Orders (From admission, onward)      Start Ordered     Status Ordering Provider    10/30/22 1737 10/30/22 1736  potassium chloride (KLOR-CON M20) CR tablet 20 mEq  Once        Route: Oral  Ordered Dose: 20 mEq       Ordered Karleigh Bunte P    10/30/22 1617 10/30/22 1616  ondansetron (ZOFRAN) injection 4 mg  Once        Route: Intravenous  Ordered Dose: 4 mg       Last MAR action: Hold Alishea Beaudin P            LABORATORY RESULTS    Ordered and independently interpreted AVAILABLE laboratory tests.   Results for orders placed or performed during the hospital encounter of 10/30/22   Comprehensive Metabolic Panel   Result Value Ref Range    Glucose 62 (L) 70 - 100 mg/dL    BUN 12 7 - 21 mg/dL    Creatinine 0.9 0.4 - 1.0 mg/dL    Sodium 578 469 - 629 mEq/L    Potassium 3.3 (L) 3.5 - 5.3 mEq/L    Chloride 110 99 - 111 mEq/L    CO2 29 17 - 29 mEq/L    Calcium 9.8 8.5 - 10.5 mg/dL    Anion Gap 6.0 5.0 - 15.0    GFR >60.0 >=60.0 mL/min/1.73 m2    AST (SGOT) 22 5 - 41 U/L    ALT 23 0 - 55 U/L    Alkaline Phosphatase 122 (H) 37 - 117 U/L    Albumin 4.0 3.5 - 5.0 g/dL    Protein, Total 8.2 6.0 - 8.3 g/dL    Globulin 4.2 (H) 2.0 - 3.6 g/dL    Albumin/Globulin Ratio 1.0 0.9 - 2.2    Bilirubin, Total 0.9 0.2 - 1.2 mg/dL   High Sensitivity Troponin-I   Result Value Ref Range    hs Troponin 3.2 <=14.0 ng/L   CBC with Differential (Component)   Result Value Ref Range    WBC 10.47 (H)  3.10 - 9.50 x10 3/uL    Hemoglobin 13.9 11.4 - 14.8 g/dL    Hematocrit 16.1 09.6 - 43.7 %    Platelet Count 158 142 - 346 x10 3/uL    MPV 13.1 (H) 8.9 - 12.5 fL    RBC 4.37 3.90 - 5.10 x10 6/uL    MCV 95.2 78.0 - 96.0 fL    MCH 31.8 25.1 - 33.5 pg    MCHC 33.4 31.5 - 35.8 g/dL    RDW 13 11 - 15 %    nRBC % 0.0 <=0.0 /100 WBC    Absolute nRBC 0.00 <=0.00 x10 3/uL    Preliminary Absolute Neutrophil Count 5.47 1.10 - 6.33 x10 3/uL    Neutrophils % 52.2 Not Established %    Lymphocytes % 37.4 Not Established %    Monocytes % 7.3 Not Established %    Eosinophils % 2.2 Not Established %    Basophils % 0.6 Not Established %    Immature Granulocytes % 0.3 Not Established %    Absolute Neutrophils 5.47 1.10 - 6.33 x10 3/uL    Absolute Lymphocytes 3.92 (H) 0.42 - 3.22 x10 3/uL    Absolute Monocytes 0.76 0.21 - 0.85 x10 3/uL    Absolute Eosinophils 0.23 0.00 - 0.44 x10 3/uL    Absolute Basophils 0.06 0.00 - 0.08 x10 3/uL    Absolute Immature Granulocytes 0.03 0.00 - 0.07 x10 3/uL   NT-ProBNP   Result Value Ref Range    NT-ProBNP 46 <125 pg/mL   ECG 12 lead   Result Value Ref Range    Ventricular Rate 65 BPM    Atrial Rate 65 BPM    P-R Interval 138 ms    QRS Duration 84 ms    Q-T Interval 404 ms    QTC Calculation (Bezet) 420 ms    P Axis 36 degrees    R Axis 13 degrees    T Axis -24 degrees    IHS MUSE NARRATIVE AND IMPRESSION       NORMAL SINUS RHYTHM  Cannot rule out ANTERIOR MYOCARDIAL INFARCTION (CITED ON OR BEFORE 09-Sep-2022)  ABNORMAL ECG  WHEN COMPARED WITH ECG OF 09-Sep-2022 08:17,  PREMATURE VENTRICULAR COMPLEXES ARE NO LONGER PRESENT  QT HAS SHORTENED     Whole Blood Glucose POCT   Result Value Ref Range    Whole Blood Glucose POCT 73 70 - 100 mg/dL       CRITICAL CARE/PROCEDURES    Procedures    DIAGNOSIS      Diagnosis:  Final diagnoses:   None       Disposition:  ED Disposition       None            Prescriptions:  Patient's Medications   New Prescriptions    No medications on file   Previous Medications    ALUM  & MAG HYDROXIDE-SIMETHICONE (MAALOX PLUS) 200-200-20 MG/5 ML SUSPENSION    Take 30 mLs by mouth every 4 (four) hours as needed for Indigestion    ATORVASTATIN (LIPITOR) 40 MG TABLET    Take 1 tablet (40 mg total) by mouth daily    FAMOTIDINE (PEPCID) 20 MG TABLET    Take 1 tablet (20 mg) by mouth every 12 (twelve) hours    HYDRALAZINE (APRESOLINE) 25 MG TABLET    Take 1 tablet (25 mg) by mouth 3 (three) times daily    HYDROCHLOROTHIAZIDE (HYDRODIURIL) 25 MG TABLET    Take 1 tablet (25 mg) by mouth  daily    LEVETIRACETAM (KEPPRA PO)    Take 500 mg by mouth 2 (two) times daily.       LISINOPRIL (ZESTRIL) 10 MG TABLET    Take 1 tablet (10 mg) by mouth daily    ONDANSETRON (ZOFRAN-ODT) 4 MG DISINTEGRATING TABLET    Take 1 tablet (4 mg) by mouth every 8 (eight) hours as needed for Nausea   Modified Medications    No medications on file   Discontinued Medications    No medications on file       This note was generated by the Epic EMR system/ Dragon speech recognition and may contain inherent errors or omissions not intended by the user. Grammatical errors, random word insertions, deletions and pronoun errors  are occasional consequences of this technology due to software limitations. Not all errors are caught or corrected. If there are questions or concerns about the content of this note or information contained within the body of this dictation they should be addressed directly with the author for clarification.         [1]   Social History  Socioeconomic History    Marital status: Married   Tobacco Use    Smoking status: Never    Smokeless tobacco: Never   Vaping Use    Vaping status: Never Used   Substance and Sexual Activity    Alcohol use: No    Drug use: No     Social Determinants of Health     Financial Resource Strain: Low Risk  (09/09/2022)    Overall Financial Resource Strain (CARDIA)     Difficulty of Paying Living Expenses: Not hard at all   Food Insecurity: No Food Insecurity (10/30/2022)    Hunger Vital Sign      Worried About Running Out of Food in the Last Year: Never true     Ran Out of Food in the Last Year: Never true   Transportation Needs: No Transportation Needs (10/30/2022)    PRAPARE - Therapist, art (Medical): No     Lack of Transportation (Non-Medical): No   Physical Activity: Sufficiently Active (09/09/2022)    Exercise Vital Sign     Days of Exercise per Week: 2 days     Minutes of Exercise per Session: 120 min   Recent Concern: Physical Activity - Insufficiently Active (06/30/2022)    Exercise Vital Sign     Days of Exercise per Week: 3 days     Minutes of Exercise per Session: 30 min   Stress: No Stress Concern Present (06/30/2022)    [employer] of Occupational Health - Occupational Stress Questionnaire     Feeling of Stress : Not at all   Social Connections: Socially Integrated (06/30/2022)    Social Connection and Isolation Panel [NHANES]     Frequency of Communication with Friends and Family: Three times a week     Frequency of Social Gatherings with Friends and Family: Once a week     Attends Religious Services: More than 4 times per year     Active Member of [employer] or Organizations: Yes     Attends Engineer, structural: More than 4 times per year     Marital Status: Married   Catering manager Violence: Not At Risk (10/30/2022)    Humiliation, Afraid, Rape, and Kick questionnaire     Fear of Current or Ex-Partner: No     Emotionally Abused: No     Physically Abused: No  Sexually Abused: No   Housing Stability: Unknown (10/30/2022)    Housing Stability Vital Sign     Unable to Pay for Housing in the Last Year: No     Homeless in the Last Year: No

## 2022-10-30 NOTE — ED Notes (Signed)
Dr. Dennison Bulla made aware of facial droop - at bedside evaluating patient.

## 2022-10-30 NOTE — EDIE (Signed)
[facility] NOTIFICATION 10/30/2022 13:26 ESTERLENE, SIMI DOB: [birth year] MRN: [id number]    [facility]'s patient encounter information:   IHK:[id number]  Account [id number]  Billing Account [id number]      Criteria Met      5 ED Visits in 12 Months    Security and Safety  No Security Events were found.  ED Care Guidelines  There are currently no ED Care Guidelines for this patient. Please check your facility's medical records system.        Prescription Monitoring Program  Narx Score not available at this time.    E.D. Visit Count (12 mo.)  Facility Visits   [facility] 4   [facility] 2   [place] [facility] 2   Total 8   Note: Visits indicate total known visits.     Recent Emergency Department Visit Summary  Date Facility [facility] Type Diagnoses or Chief Complaint    Oct 30, 2022  [facility]  [name]  Emergency      chest pains, shortness of breath      Oct 08, 2022  [facility] Rhett Bannister  [place]  Emergency      Acute bronchitis, unspecified      Pain, Chest      Shortness Of Breath      Chest pain, shortness of breath, and body weakness      Sep 18, 2022  [facility] Rhett Bannister  MD  Emergency      Chest pain, unspecified      Pain, Chest      SOB, chest pain,rib pain      Sep 08, 2022  [facility]  [name]  [place]  Emergency      Facial weakness      Extremity Weakness      Numbness      Chest Pain      numbness in face      Jul 19, 2022  [place]  [place]  Emergency      Facial weakness      Facial Droop      Chest Pain      triage- CP, facial droop x 4 days      triage      Jun 30, 2022  [place] [facility]  [place]  Emergency      Chest pain, unspecified      Chest Pain      Shortness of Breath      triage - Chest Pain      triage      Jun 12, 2022  [facility] Rhett Bannister  MD  Emergency      Syncope and collapse      Chest pain, unspecified      Hypokalemia      Pain, Chest       Light Headed, DIzzy, back pain, Chest pain      Apr 04, 2022  [facility] Rhett Bannister  MD  Emergency      Other chest pain      Essential (primary) hypertension      Chest pain, unspecified      Pain, Rib      Chest Pain,SOB,Left Side Body Pain        Recent Inpatient Visit Summary  Date Facility [facility] Type  Diagnoses or Chief Complaint    Nov 30, 2021  [facility] Rhett Bannister  MD  Internal Medicine      Weakness        Care Team  No Care Team was found.  [facility]  This patient has registered at the [facility] Emergency Department  For more information visit: [email or web address]     PLEASE NOTE:     1.   Any care recommendations and other clinical information are provided as guidelines or for historical purposes only, and providers should exercise their own clinical judgment when providing care.    2.   You may only use this information for purposes of treatment, payment or health care operations activities, and subject to the limitations of applicable [facility] Policies.    3.   You should consult directly with the organization that provided a care guideline or other clinical history with any questions about additional information or accuracy or completeness of information provided.    ? 2024 [facility] - [email or web address]

## 2022-10-30 NOTE — ED Notes (Signed)
Patient still complaining of chest pain with nausea and radiating to left side of the arm; inpatient provider aware and said he will come to assess.

## 2022-10-31 ENCOUNTER — Observation Stay (HOSPITAL_BASED_OUTPATIENT_CLINIC_OR_DEPARTMENT_OTHER): Payer: No Typology Code available for payment source

## 2022-10-31 ENCOUNTER — Observation Stay: Payer: No Typology Code available for payment source

## 2022-10-31 DIAGNOSIS — I619 Nontraumatic intracerebral hemorrhage, unspecified: Secondary | ICD-10-CM

## 2022-10-31 LAB — ECHO ADULT TTE COMPLETE
Ao Root Diameter (2D): 3.8
IVS Diastolic Thickness (2D): 1.2
LA Dimension (2D): 3.7
LA Volume Index (BP A-L): 23
LVID diastole (2D): 4.8
LVID systole (2D): 3.3
MV E/A: 1.038
MV E/e' (Average): 12.182
Mitral Valve Findings: NORMAL
Prox Ascending Aorta Diameter: 4
RV Basal Diastolic Dimension: 4.3
RV Systolic Pressure: 22.01
TAPSE: 2.2
Tricuspid Valve Findings: NORMAL

## 2022-10-31 LAB — RENAL FUNCTION PANEL
Albumin: 3.6 g/dL (ref 3.5–5.0)
Anion Gap: 7 (ref 5.0–15.0)
BUN: 9 mg/dL (ref 7–21)
CO2: 28 mEq/L (ref 17–29)
Calcium: 9.1 mg/dL (ref 8.5–10.5)
Chloride: 108 mEq/L (ref 99–111)
Creatinine: 0.8 mg/dL (ref 0.4–1.0)
GFR: >60 mL/min/{1.73_m2} (ref >=60.0)
Glucose: 141 mg/dL — ABNORMAL HIGH (ref 70–100)
Phosphorus: 3.6 mg/dL (ref 2.3–4.7)
Potassium: 3.6 mEq/L (ref 3.5–5.3)
Sodium: 143 mEq/L (ref 135–145)

## 2022-10-31 LAB — CBC
Absolute nRBC: 0 10*3/uL (ref <=0.00)
Hematocrit: 41.2 % (ref 34.7–43.7)
Hemoglobin: 13.6 g/dL (ref 11.4–14.8)
MCH: 31.6 pg (ref 25.1–33.5)
MCHC: 33 g/dL (ref 31.5–35.8)
MCV: 95.6 fL (ref 78.0–96.0)
MPV: 13.7 fL — ABNORMAL HIGH (ref 8.9–12.5)
Platelet Count: 146 10*3/uL (ref 142–346)
RBC: 4.31 10*6/uL (ref 3.90–5.10)
RDW: 13 % (ref 11–15)
WBC: 7.33 10*3/uL (ref 3.10–9.50)
nRBC %: 0 /100 WBC (ref <=0.0)

## 2022-10-31 LAB — WHOLE BLOOD GLUCOSE POCT
Whole Blood Glucose POCT: 132 mg/dL — ABNORMAL HIGH (ref 70–100)
Whole Blood Glucose POCT: 106 mg/dL — ABNORMAL HIGH (ref 70–100)
Whole Blood Glucose POCT: 124 mg/dL — ABNORMAL HIGH (ref 70–100)
Whole Blood Glucose POCT: 114 mg/dL — ABNORMAL HIGH (ref 70–100)

## 2022-10-31 LAB — HEPATIC FUNCTION PANEL (LFT)
ALT: 20 U/L (ref 0–55)
AST (SGOT): 18 U/L (ref 5–41)
Albumin/Globulin Ratio: 1 (ref 0.9–2.2)
Albumin: 3.6 g/dL (ref 3.5–5.0)
Alkaline Phosphatase: 106 U/L (ref 37–117)
Bilirubin Direct: 0.3 mg/dL (ref 0.0–0.5)
Bilirubin Indirect: 0.7 mg/dL (ref 0.2–1.0)
Bilirubin, Total: 1 mg/dL (ref 0.2–1.2)
Globulin: 3.6 g/dL (ref 2.0–3.6)
Protein, Total: 7.2 g/dL (ref 6.0–8.3)

## 2022-10-31 LAB — PT/INR
INR: 1.1 (ref 0.9–1.1)
PT: 12.9 s (ref 10.1–12.9)

## 2022-10-31 LAB — THYROID STIMULATING HORMONE (TSH) WITH REFLEX TO FREE T4: TSH: 3.62 u[IU]/mL (ref 0.35–4.94)

## 2022-10-31 LAB — MAGNESIUM: Magnesium: 2.2 mg/dL (ref 1.6–2.6)

## 2022-10-31 LAB — ECG 12-LEAD
P Axis: 50 degrees
T Axis: 3 degrees

## 2022-10-31 LAB — APTT: PTT: 33 s (ref 27–39)

## 2022-10-31 MED ORDER — LORAZEPAM 1 MG PO TABS
1.0000 mg | ORAL_TABLET | ORAL | Status: DC | PRN
Start: 2022-10-31 — End: 2022-11-05

## 2022-10-31 MED ORDER — HYDROMORPHONE HCL 1 MG/ML IJ SOLN
1.0000 mg | Freq: Once | INTRAMUSCULAR | Status: AC
Start: 2022-10-31 — End: 2022-10-31
  Administered 2022-10-31: 1 mg via INTRAVENOUS
  Filled 2022-10-31: qty 1

## 2022-10-31 MED ORDER — MAGNESIUM SULFATE IN D5W 1-5 GM/100ML-% IV SOLN
1.0000 g | Freq: Two times a day (BID) | INTRAVENOUS | Status: AC
Start: 2022-10-31 — End: 2022-11-02
  Administered 2022-10-31 – 2022-11-02 (×4): 1 g via INTRAVENOUS
  Filled 2022-10-31 (×4): qty 100

## 2022-10-31 MED ORDER — PREDNISONE 20 MG PO TABS
60.0000 mg | ORAL_TABLET | Freq: Every day | ORAL | Status: DC
Start: 2022-10-31 — End: 2022-11-05
  Administered 2022-10-31 – 2022-11-04 (×5): 60 mg via ORAL
  Filled 2022-10-31 (×6): qty 3

## 2022-10-31 MED ORDER — LISINOPRIL 20 MG PO TABS
20.0000 mg | ORAL_TABLET | Freq: Every day | ORAL | Status: DC
Start: 2022-11-01 — End: 2022-11-05
  Administered 2022-11-01 – 2022-11-04 (×4): 20 mg via ORAL
  Filled 2022-10-31 (×5): qty 1

## 2022-10-31 MED ORDER — HYDROMORPHONE HCL 1 MG/ML IJ SOLN
1.0000 mg | INTRAMUSCULAR | Status: DC | PRN
Start: 2022-10-31 — End: 2022-11-03
  Administered 2022-10-31 – 2022-11-03 (×15): 1 mg via INTRAVENOUS
  Filled 2022-10-31 (×15): qty 1

## 2022-10-31 MED ORDER — VALACYCLOVIR HCL 500 MG PO TABS
1000.0000 mg | ORAL_TABLET | Freq: Three times a day (TID) | ORAL | Status: DC
Start: 2022-10-31 — End: 2022-11-05
  Administered 2022-10-31 – 2022-11-05 (×15): 1000 mg via ORAL
  Filled 2022-10-31 (×19): qty 2

## 2022-10-31 MED ORDER — DIPHENHYDRAMINE HCL 50 MG/ML IJ SOLN
6.2500 mg | Freq: Four times a day (QID) | INTRAMUSCULAR | Status: DC | PRN
Start: 2022-10-31 — End: 2022-11-05
  Administered 2022-10-31 – 2022-11-01 (×2): 6.25 mg via INTRAVENOUS
  Filled 2022-10-31 (×2): qty 1

## 2022-10-31 MED ORDER — MIDAZOLAM HCL 1 MG/ML IJ SOLN (WRAP)
1.0000 mg | Freq: Once | INTRAMUSCULAR | Status: AC
Start: 2022-10-31 — End: 2022-10-31
  Administered 2022-10-31: 1 mg via INTRAVENOUS
  Filled 2022-10-31 (×2): qty 2

## 2022-10-31 MED ORDER — AMLODIPINE BESYLATE 5 MG PO TABS
5.0000 mg | ORAL_TABLET | Freq: Every day | ORAL | Status: DC
Start: 2022-11-01 — End: 2022-11-02
  Administered 2022-11-01 – 2022-11-02 (×2): 5 mg via ORAL
  Filled 2022-10-31 (×2): qty 1

## 2022-10-31 MED ORDER — FAMOTIDINE 10 MG/ML IV SOLN (WRAP)
20.0000 mg | Freq: Two times a day (BID) | INTRAVENOUS | Status: DC
Start: 2022-10-31 — End: 2022-11-01
  Administered 2022-10-31 – 2022-11-01 (×3): 20 mg via INTRAVENOUS
  Filled 2022-10-31 (×3): qty 2

## 2022-10-31 MED ORDER — GADOTERATE MEGLUMINE 10 MMOL/20ML IV SOLN (CLARISCAN)
20.0000 mL | Freq: Once | INTRAVENOUS | Status: DC | PRN
Start: 2022-10-31 — End: 2022-11-05

## 2022-10-31 MED ORDER — MAGNESIUM SULFATE IN D5W 1-5 GM/100ML-% IV SOLN
1.0000 g | Freq: Two times a day (BID) | INTRAVENOUS | Status: DC
Start: 2022-10-31 — End: 2022-10-31

## 2022-10-31 NOTE — Plan of Care (Signed)
Pt is an admission who arrived on the floor on a stretcher, RA, with belongings. Pt is alert and oriented x4, BP high, on stroke pathway, permissive BP ordered, NIH 4, slight weakness to the right arm and leg, and a right facial droop. Dysphagia screening passed, reported chest pain, dilaudid given and improved chest pain. Denies sob, nausea, vomiting, dizziness, and any numbness or tingling. Skin assessed, belongings assessed, discussed the plan of care, she expressed understanding, plan of care will continue.  Problem: Pain interferes with ability to perform ADL  Goal: Pain at adequate level as identified by patient  Outcome: Progressing  Flowsheets (Taken 10/31/2022 0219)  Pain at adequate level as identified by patient:   Identify patient comfort function goal   Assess pain on admission, during daily assessment and/or before any "as needed" intervention(s)   Include patient/patient care companion in decisions related to pain management as needed     Problem: Every Day - Stroke  Goal: Neurological status is stable or improving  Outcome: Progressing  Flowsheets (Taken 10/31/2022 0219)  Neurological status is stable or improving:   Monitor/assess/document neurological assessment (Stroke: every 4 hours)   Re-assess NIH Stroke Scale for any change in status   Perform CAM Assessment   Monitor/assess NIH Stroke Scale   Observe for seizure activity and initiate seizure precautions if indicated     Problem: Safety  Goal: Patient will be free from injury during hospitalization  Outcome: Progressing  Flowsheets (Taken 10/31/2022 0219)  Patient will be free from injury during hospitalization:   Assess patient's risk for falls and implement fall prevention plan of care per policy   Use appropriate transfer methods   Ensure appropriate safety devices are available at the bedside   Provide and maintain safe environment   Include patient/ family/ care giver in decisions related to safety   Hourly rounding   Assess for patients  risk for elopement and implement Elopement Risk Plan per policy  Goal: Patient will be free from infection during hospitalization  Outcome: Progressing  Flowsheets (Taken 10/31/2022 0219)  Free from Infection during hospitalization:   Assess and monitor for signs and symptoms of infection   Monitor all insertion sites (i.e. indwelling lines, tubes, urinary catheters, and drains)   Encourage patient and family to use good hand hygiene technique   Monitor lab/diagnostic results     Problem: Pain  Goal: Pain at adequate level as identified by patient  Outcome: Progressing  Flowsheets (Taken 10/31/2022 0219)  Pain at adequate level as identified by patient:   Identify patient comfort function goal   Assess pain on admission, during daily assessment and/or before any "as needed" intervention(s)   Include patient/patient care companion in decisions related to pain management as needed     Problem: Discharge Barriers  Goal: Patient will be discharged home or other facility with appropriate resources  Outcome: Progressing  Flowsheets (Taken 10/31/2022 0219)  Discharge to home or other facility with appropriate resources:   Provide appropriate patient education   Provide information on available health resources   Initiate discharge planning     Problem: Day of Admission - Stroke  Goal: Core/Quality measure requirements - Admission  Flowsheets (Taken 10/31/2022 0219)  Core/Quality measure requirements - Admission:   Document NIH Stroke Scale on admission   Document nursing swallow/dysphagia screen on admission. If patient fails, keep patient NPO (follow your hospital protocol on swallowing screening).   VTE Prevention: Ensure anticoagulant(s) administered and/or anti-embolism stockings/devices documented as ordered   Ensure  lipid panel ordered   Begin stroke education on admission (must include Modifiable Risk Factors, Warning Signs and Symptoms of Stroke, Activation of Emergency Medical System and Follow-up Appointments) Ensure  handout has been given and documented.   Ensure PT/OT and/or SLP ordered

## 2022-10-31 NOTE — Progress Notes (Signed)
10/31/22 1130   Volunteer Chaplain   Visit Type Initial   Source Chaplain Initiated   Present at Visit Patient   Spiritual Care Provided to patient   Reason for Visit Emotional Support;Spiritual Support   Spiritual Care Interventions Provided silent and supportive presence;Provided prayer   Spiritual Care Outcomes Made a positive connection with patient   Length of Visit 0-15 minutes   Follow-up Follow-up to reassess (2)     Leslee Suire, M.Div.    Spiritual Care Dept.     Chaplain         [facility]    Office: [phone] (M-F 8:30 am -4:30 pm)    [employer] Paging for Emergency: [phone])    [email or web address]

## 2022-10-31 NOTE — Progress Notes (Signed)
10/31/22 1432   CM Review   Case Management Assessment Status Remote assessment complete   CM Comments 8/20 - From: Home; DCP: Home; no therapy needs

## 2022-10-31 NOTE — Progress Notes (Signed)
4 eyes in 4 hours pressure injury assessment note:      Completed with: Afia   Unit & Time admitted:6B at 2345             Bony Prominences: Check appropriate box; if wound is present enter wound assessment in LDA     Occiput:                 [x] WNL  []  Wound present  Face:                     [x] WNL  []  Wound present  Ears:                      [x] WNL  []  Wound present  Spine:                    [x] WNL  []  Wound present  Shoulders:             [x] WNL  []  Wound present  Elbows:                  [x] WNL  []  Wound present  Sacrum/coccyx:     [x] WNL  []  Wound present  Ischial Tuberosity:  [x] WNL  []  Wound present  Trochanter/Hip:      [x] WNL  []  Wound present  Knees:                   [x] WNL  []  Wound present  Ankles:                   [x] WNL  []  Wound present  Heels:                    [x] WNL  []  Wound present  Other pressure areas:  []  Wound location       Device related: []  Device name:         LDA completed if wound present: yes/no  Consult WOCN if necessary    Other skin related issues, ie tears, rash, etc, document in Integumentary flowsheet

## 2022-10-31 NOTE — Plan of Care (Signed)
The learning abilities of the patient and/or caregiver have been assessed. Today's individualized plan of care includes VS/Neuro checks q4, stroke education, NIH documentation, pain management, MRI, and Safety precautions. The patient or caregiver states the following personal goal related to the patient's deficit(s): "By discharge I want to be able to go home without feeling any chest pain." The plan of care was discussed with the patient and/or caregiver, who agrees to it and demonstrates understanding of the disease process, risk factors, treatment plan, medications and consequences of noncompliance. All questions and concerns were addressed.     Problem: Pain interferes with ability to perform ADL  Goal: Pain at adequate level as identified by patient  Outcome: Progressing  Flowsheets (Taken 10/31/2022 2027)  Pain at adequate level as identified by patient:   Identify patient comfort function goal   Assess for risk of opioid induced respiratory depression, including snoring/sleep apnea. Alert healthcare team of risk factors identified.   Assess pain on admission, during daily assessment and/or before any "as needed" intervention(s)   Reassess pain within 30-60 minutes of any procedure/intervention, per Pain Assessment, Intervention, Reassessment (AIR) Cycle   Evaluate if patient comfort function goal is met   Evaluate patient's satisfaction with pain management progress   Offer non-pharmacological pain management interventions     Problem: Side Effects from Pain Analgesia  Goal: Patient will experience minimal side effects of analgesic therapy  Outcome: Progressing  Flowsheets (Taken 10/31/2022 2027)  Patient will experience minimal side effects of analgesic therapy:   Monitor/assess patient's respiratory status (RR depth, effort, breath sounds)   Assess for changes in cognitive function   Evaluate for opioid-induced sedation with appropriate assessment tool (i.e. POSS)   Prevent/manage side effects per LIP orders  (i.e. nausea, vomiting, pruritus, constipation, urinary retention, etc.)     Problem: Every Day - Stroke  Goal: Core/Quality measure requirements - Daily  Outcome: Progressing  Flowsheets (Taken 10/31/2022 2027)  Core/Quality measure requirements - Daily:   VTE Prevention: Ensure anticoagulant(s) administered and/or anti-embolism stockings/devices documented by end of day 2   Once lipid panel has resulted, check LDL. Contact provider for statin order if LDL > 70 (or ensure contraindication documented by LIP).   Continue stroke education (must include Modifiable Risk Factors, Warning Signs and Symptoms of Stroke, Activation of Emergency Medical System and Follow-up Appointments). Ensure handout has been given and documented.   Ensure antithrombotic administered or contraindication documented by LIP by end of day 2  Goal: Neurological status is stable or improving  Outcome: Progressing  Flowsheets (Taken 10/31/2022 2027)  Neurological status is stable or improving:   Monitor/assess/document neurological assessment (Stroke: every 4 hours)   Re-assess NIH Stroke Scale for any change in status   Monitor/assess NIH Stroke Scale   Observe for seizure activity and initiate seizure precautions if indicated   Perform CAM Assessment  Goal: Stable vital signs and fluid balance  Outcome: Progressing  Flowsheets (Taken 10/31/2022 2027)  Stable vital signs and fluid balance:   Position patient for maximum circulation/cardiac output   Monitor intake and output. Notify LIP if urine output is < 30 mL/hour.   Monitor and assess vitals every 4 hours or as ordered and hemodynamic parameters   Encourage oral fluid intake   Apply telemetry monitor as ordered  Goal: Mobility/Activity is maintained at optimal level for patient  Outcome: Progressing  Flowsheets (Taken 10/31/2022 2027)  Mobility/activity is maintained at optimal level for patient: Encourage independent activity per ability     Problem:  Safety  Goal: Patient will be free from  infection during hospitalization  Outcome: Progressing  Flowsheets (Taken 10/31/2022 2027)  Free from Infection during hospitalization:   Assess and monitor for signs and symptoms of infection   Monitor lab/diagnostic results   Monitor all insertion sites (i.e. indwelling lines, tubes, urinary catheters, and drains)   Encourage patient and family to use good hand hygiene technique     Problem: Pain  Goal: Pain at adequate level as identified by patient  Outcome: Progressing  Flowsheets (Taken 10/31/2022 2027)  Pain at adequate level as identified by patient:   Identify patient comfort function goal   Assess for risk of opioid induced respiratory depression, including snoring/sleep apnea. Alert healthcare team of risk factors identified.   Assess pain on admission, during daily assessment and/or before any "as needed" intervention(s)   Reassess pain within 30-60 minutes of any procedure/intervention, per Pain Assessment, Intervention, Reassessment (AIR) Cycle   Evaluate if patient comfort function goal is met   Evaluate patient's satisfaction with pain management progress   Offer non-pharmacological pain management interventions

## 2022-10-31 NOTE — Progress Notes (Signed)
10/31/22 1430   Patient Type   Within 30 Days of Previous Admission? No   Healthcare Decisions   Interviewed: Patient   Orientation/Decision Making Abilities of Patient Alert and Oriented x3, able to make decisions   Advance Directive Patient does not have advance directive   Prior to admission   Prior level of function Independent with ADLs;Ambulates independently   Type of Residence Private residence   Have running water, electricity, heat, etc? Yes   How do you get to your MD appointments? Self   How do you get your groceries? Self   Who fixes your meals? Self   Who does your laundry? Self   Who picks up your prescriptions? Self   Dressing Independent   Grooming Independent   Feeding Independent   Bathing Independent   Toileting Independent   Discharge Planning   Support Systems Spouse/significant other   Patient expects to be discharged to: Home; no therapy needs   Anticipated [place] plan discussed with: Same as interviewed   Mode of transportation: Private car (family member)   Does the patient have perscription coverage? Yes   Financial Resource Strain   How hard is it for you to pay for the very basics like food, housing, medical care, and heating? Not very   Housing Stability   In the last 12 months, was there a time when you were not able to pay the mortgage or rent on time? N   In the past 12 months, how many times have you moved where you were living? 0   At any time in the past 12 months, were you homeless or living in a shelter (including now)? N   Transportation Needs   In the past 12 months, has lack of transportation kept you from medical appointments or from getting medications? no   In the past 12 months, has lack of transportation kept you from meetings, work, or from getting things needed for daily living? No   Consults/Providers   PT Evaluation Needed 1  (Completed)   OT Evalulation Needed 1  (Completed)   SLP Evaluation Needed 1  (Completed)   Correct PCP listed in Epic? Yes   Family and PCP    PCP on file was verified as the current PCP? Yes   In case you are admitted, transferred or discharged, would like family notified? Yes   Name of family member to be notified Luisa Hart - sig. other   In case you are admitted, transferred or discharged, would like your PCP notified? Yes   Important Message from [facility] Notice   Patient received 1st IMM Letter? n/a

## 2022-10-31 NOTE — SLP Eval Note (Signed)
[facility]  [address]  [place] [address]  [phone]    Speech and Language Therapy Bedside Swallow Evaluation     Patient: [name] Cook    MRN#: [id number]   Unit: [facility] INTERMEDIATE CARE Bed: MI628/MI628-01    Date/Time of Treatment:   SLP Received On: 10/31/22  Start Time: 0930  Stop Time: 0950  Time Calculation (min): 20 min    Interpreter utilized: no, not indicated    Therapist PPE during session gloves   Assessment   [name] Cook is a 47 y.o. female with a PMH of CVA, seizures, HTN, and HLD who was presented to the ED on 10/30/2022 for chest pain/SOB and subsequently was found to have a L-facial droop. Pt was admitted for stroke workup. MRI and CT were negative. Pt referred to SLP d/t facial weakness.    SLP Assessment: Pt is presenting with mild oral dysphagia at the bedside. Oral phase is c/b prolonged but effective mastication of regular solids. Effective oral containment and clearance. Functional pharyngeal phase observed, no s/s of aspiration present throughout evaluation. Oral motor exam significant for L-facial weakness (more consistent with paralysis vs. Droop). SLP is recommending continuation of regular diet/thin liquids; however pt reports that she will likely order foods that are naturally softer d/t prolonged mastication of dry/crunchy solids. Medications should be provided whole in puree (pt reports this is baseline method). SLP will follow up x1 session for diet tolerance/strategy training.     Of note, motor-speech assessed informally through conversation, for which pt was judged to be 100% intelligible. Dysarthria not present.         Plan/Recommendations   Plan: begin/continue oral diet, dysphagia treatment  SLP Frequency Recommended: follow-up visit only  Diet Solids Recommendation: Regular (IDDSI RG7)  Diet Liquids Recommendations: Thin Liquids (IDDSI TN0)  Precautions/Compensations: Upright 90 degrees for all oral intake, Alternate solids and liquids, Small  bites/sips, Eat/feed slowly  Recommended Form of Meds: whole, with puree (IDDSI level 4) (pt reports this is how she consumed meds at baseline)  Suggestions for Feeding: independent     Recommendations: Outpatient speech (if facial paralysis persists)  Recommendation Discussed With: : Patient, Physician, Nurse       Goals:   1) Pt will tolerate regular diet/thin liquids without s/s of aspiration >90% of the time.  2) Pt will utilize swallow strategies during PO intake with 90% accuracy.    Rehabilitation Prognosis: good due to age, participation, and PLOF.    History     Prior Speech Therapy Intervention: No history of SLP per pt and chart review.    Social History: Lives with husband and daughter. Has 7 children, all live outside of the home except for youngest one.   Baseline Diet: Regular/thin (reports meds whole with puree)  Feeding: Independent  Cognition: WFL    Dysphagia Related Hx:  Dysphagia: Denies  Recurrent PNA: Denies  Unintended weight loss: Denies  Reflux: Denies    Medical Diagnosis: CVA (cerebrovascular accident due to intracerebral hemorrhage) [I61.9]  Stroke-like symptoms [R29.90]    Imaging:  MR Angiogram Neck WO Contrast    Result Date: 10/30/2022   1.There is no stenosis of the proximal right internal carotid artery based on NASCET criteria. 2.There is no stenosis of the proximal left internal carotid artery based on NASCET criteria. 3.The cervical vertebral arteries are patent. 4.The intracranial arteries are patent, no proximal intracranial large vessel occlusion.  2 [name] Cook [name] Pheasant, MD 10/30/2022 9:52 PM  MR Angiogram Head WO Contrast    Result Date: 10/30/2022   1.There is no stenosis of the proximal right internal carotid artery based on NASCET criteria. 2.There is no stenosis of the proximal left internal carotid artery based on NASCET criteria. 3.The cervical vertebral arteries are patent. 4.The intracranial arteries are patent, no proximal intracranial large vessel occlusion.  2 [name] Cook  [name] Pheasant, MD 10/30/2022 9:52 PM    MRI brain without contrast    Result Date: 10/30/2022   1.Negative MRI brain. No acute infarction. [name] Cook [name] Pheasant, MD 10/30/2022 9:47 PM    CT Head WO Contrast    Result Date: 10/30/2022   No acute intracranial abnormality. [name] Waunita Schooner, MD 10/30/2022 4:04 PM    XR Chest PA Or AP    Result Date: 10/30/2022   Small foci of basilar atelectasis. [name] Birch, MD 10/30/2022 2:44 PM      History of Present Illness: [name] Cook is a 47 y.o. female admitted on 10/30/2022 with   Problem List[1]     Past Medical/Surgical History  Medical History[2]   Past Surgical History:   Procedure Laterality Date    APPENDECTOMY (OPEN)      CHOLECYSTECTOMY      EGD, BIOPSY N/A 04/16/2017    Procedure: EGD, BIOPSY;  Surgeon: [name] Proud, MD;  [facility]: [name];  Service: Gastroenterology;  Laterality: N/A;    HYSTERECTOMY           Subjective   Patient is agreeable to participation in the therapy session. Nursing clears patient for therapy. Patient's medical condition is appropriate for Speech therapy intervention at this time.  Pain: pt denies pain    Objective   Observation of Patient/Vital Signs:  Patient is in bed   Completed bedside swallow evaluation and provided education regarding swallow, swallow strategies and precautions.    Orientation: person, place, time, situation    Respiratory Status  Current Status  Respiratory Status: within normal limits, room air    Oral Motor Skills  Oral Care: Excellent   Structural Abnormalities: n/a  Dentition: Normal  Oral Sensation: WFL  Mucosal Quality: Clear/moist  Secretion Management: WFL  Additional Comments: n/a    Cranial Nerve Exam  CNV Trigeminal (sensation to face, mandibular movements, chewing):   Impaired mandibular opening  CNVII Facial (taste to anterior 2/3 tongue, raise eyebrows/wrinkle forehead, smile/pucker, puff cheeks with air):   Left facial droop , Impaired labial pursing, Impaired labial retraction, and Left  labial asymmetry   CNIX Glossopharyngeal (taste to posterior 1/3 tongue, gag response, velar elevation):   Intact bilaterally  CN X Vagus (sensation to larynx/pharynx, cough reflex, laryngeal function, vocal quality):   Appears intact bilaterally  CNXII Hypoglossal (lingual strength, movement, symmetry):   Impaired lateralization  Additional Comments: n/a    Deglutition Skills  Nsg Dysphagia Screen: pass  3 oz water challenge: Not completed  Position: upright 90 degrees  Food(s) Tested: Thin (IDDSI TN0);Via cup;Via straw;Pureed (IDDSI P4);Soft and bite Sized (IDDSI SB6);Regular (IDDSI RG7)  Oral Stage: chewing reduced, slow but effective  Pharyngeal Stage: adequate  Esophageal Stage: appears [facility]    Education  Audience: Patient  Learning Preferences:  Explanation  Barriers to Learning/Readiness to Learn:  No barriers    Topics:  role of SLP, diet recommendations, swallow strategies, risks of aspiration, SLP plan     Mode of Education Provided:  Explanation    Learner Response:  Verbalized understanding  Further Teaching Required:  Needs reinforcement  Signature  Mikael Spray, SLP   10/31/2022    (For scheduling questions, please contact rehab tech x [phone])        [1]   Patient Active Problem List  Diagnosis    Chest pain    Seizures    Mouth droop due to facial weakness    Tension type headache    History of stroke    Morbid obesity with BMI of 45.0-49.9, adult    Hematemesis    Blood loss anemia    HLD (hyperlipidemia)    Paresthesia of right arm and leg    Hemorrhoids    Right hemiparesis    Nonspecific abnormal electroencephalogram (EEG)    Headache    Abdominal pain    Cellulitis of other specified site    Wound dehiscence    History of seizure disorder    HTN (hypertension)    Right sided weakness    Stroke-like symptoms    CVA (cerebrovascular accident due to intracerebral hemorrhage)    Pre-diabetes    Acute hypokalemia   [2]   Past Medical History:  Diagnosis Date    Hyperlipidemia     Hypertension      Migraine     Morbid obesity     Palpitations     loop recorder placed in 2019, had to be removed after a month due to misplacement and keloid formation per patient    Seizures     Stroke     2015    Thoracic aortic aneurysm     TIA (transient ischemic attack) 2017

## 2022-10-31 NOTE — UM Notes (Signed)
ADMIT TO OBSERVATION (OUTPATIENT WITH OBSERVATION SERVICES) [[id number]]    Electronically signed by: Eunice Blase, MD on 10/30/22 1805 Status: Completed   Ordering user: Eunice Blase, MD 10/30/22 1805 Ordering provider: Eunice Blase, MD   Authorized by: Eunice Blase, MD    Add Signature Requirement   Frequency: Once 10/30/22 1803 - 1  occurrence     47 y.o. female presents for evaluation of chest pain associated with shortness of breath that she describes as substernal nonradiating and is going on for a few days currently resolved.     PMH: right cva w/o residual deficits, htn, hld, pre-dm     ED noticed on arrival by nursing staff that she has a right-sided facial droop but patient has no certainty of when this began. On my exam she also has right sided weakness that again patient is uncertain when it began but according to the ed provider not present when he examined her 4-5 hours ago     NIHSS Score 1        8/19 Medicine plan:  - [facility], observation status  - TIA/CVA order set/pathway  - Neuro checks q4hrs  - MRI Brain   - MRA Brain and Neck  - 2D-Echo  - Tele monitor  - Hold bp meds to allow for permissive htn  - prn iv meds with parameters for blood pressure  - Neurology consulted by me  - PT/OT/SLP evals  - NPO till patient passes bedside swallow eval  - ASA and High Intensity Statin therapy  - Check Hgaic and fasting lipids  - dvt prophy with sq heparin   - check fs with ssi coverage  - cardiology consulted by ed provider  - monitor further chest pain now resolved on tele and trend troponin  - replace potassium and check mag, phos levels  - c/w keppra for sz d/o     Patient has BMI=Body mass index is 46.91 kg/m.  Diagnosis: Obesity Class 3 (formerly known as Morbid Obesity) based on BMI criteria      VTE Prophylaxis-Medication VTE Prophylaxis Orders: heparin (porcine) injection 5,000 Units  Mechanical VTE Prophylaxis Orders: Maintain sequential compression  device  Nutrition:Diet NPO effective now  Supervise For Meals Frequency: All meals     Code status: Full code      Status/Disposition:   Pt is admitted under OBSERVATION with above concerns.    Anticipated medical stability for discharge: 24 Hrs              10/30/22 14:11 10/30/22 16:02 10/30/22 23:15 10/31/22 05:56 10/31/22 07:36   WBC 10.47 (H)   7.33    MPV 13.1 (H)   13.7 (H)    Lymphocytes Absolute Automated 3.92 (H)       Glucose 62 (L)   141 (H)    Whole Blood Glucose POCT  73 107 (H)  132 (H)   Potassium 3.3 (L)   3.6    Alkaline Phosphatase 122 (H)   106    Globulin 4.2 (H)   3.6             Gillian Scarce, MSN, RN  [facility], Revenue Cycle Department, Utilization Review  Phone [phone]   Fax [phone]   [email or web address]

## 2022-10-31 NOTE — OT Progress Note (Signed)
[facility]  Occupational Therapy Attempt Note    Patient:  [name] Cook MRN#:  [id number]    Unit:  [facility] INTERMEDIATE CARE Room/Bed:  MI628/MI628-01      OT Cancellation: Order     OT Order Cancellation Reason: Not clinically indicated at this time (comment required) - Rehab decision    OT eval orders received. Patient not seen for occupational therapy evaluation as pt is at/near functional baseline with all ADL, transfers, mobility per communication with treating PT. No acute OT needs identified, d/c OT. Will need new orders if functional status changes.         Signature:   Lulu Riding, OT  10/31/2022  9:33 AM    (For scheduling questions, please contact rehab tech [phone])

## 2022-10-31 NOTE — Consults (Signed)
NEUROLOGY CONSULTATION    Date Time: 10/31/22 11:58 AM  Patient Name: [name] Cook  Attending Physician: Illene Regulus, MD      Assessment & Plan:   Admitted for Right sided facial droop that started in the ed. Pt also c/o of cp and left sided ha.  DX: CVA vs Bell's Palsy     Pt continues to have right sided facial droop, in setting of negative MRI likely partial bell's palsy will start meds and continue to evaluate.       CTH negative   MRI Head, MRA Head and Neck - negative for acute abnormality  Start prednisone 60 mg daily for 7 days and valacyclovir 1000 mg  three times daily for 7 days  Will consider MRI Brain with contrast and/or thin slices.   Recent A1C/Lipids ( June 2024) 5.7, 165. No need to repeat., TSH 3.62  Continue ASA, Statin  Echo no acute abnormality.   D/w Dr. Francesco Sor, will continue to evaluate    Addendum, patient seen and examined personally, discussed with NP.  Discussed with the ED, patient continues to experience facial asymmetry, and severe headache, the facial weakness is somewhat atypical, with primarily lower part of the face being involved which typically points towards a CNS cause hence will do a repeat MRI with thin slices along with contrast, this will help to see if there is any facial nerve enhancement in terms of establishing the correct diagnosis  In the meantime continue headache management, and also prednisone and valacyclovir for presumed Bell's palsy    History of Present Illness:   Neurology consultation requested by:-->  Dennison Bulla, ED MD.     47 y.o female with hx of CVA with no known residual deficits, htn, hld, pre-dm presented to the ed for chest pain and sob that was ongoing for a few days and has since resolved. Pt began having right sided facial droop while in the ed. Denies arm or leg weakness, altered taste or hearing. She does had a left parietal ha, but no occipital or posterior ear pain.  Symptoms continued this morning on exam.         Past Medical History:    Medical History[1]    Meds:     Home Medications       Med List Status: Pharmacy Completed Set By: Coral Spikes at 10/30/2022  6:23 PM              amLODIPine (NORVASC) 5 MG tablet     Take 1 tablet (5 mg) by mouth daily     aspirin EC 325 MG tablet     Take 1 tablet (325 mg) by mouth daily     atorvastatin (LIPITOR) 80 MG tablet     Take 1 tablet (80 mg) by mouth daily     hydroCHLOROthiazide (HYDRODIURIL) 25 MG tablet     Take 1 tablet (25 mg) by mouth daily     LevETIRAcetam (KEPPRA PO)     Take 500 mg by mouth 2 (two) times daily.        lisinopril (ZESTRIL) 20 MG tablet     Take 1 tablet (20 mg) by mouth daily             Patient taking differently:                            Allergies[2]    Social & Family History:   Social History[3]  Family History   Problem Relation Age of Onset    Myocardial Infarction Father 32    Deep vein thrombosis Father     No known problems Mother            CODE STATUS:   I personally reviewed all of the medications.  Medication list generated using all available resources.  Elder abuse (physical)  - negative  Advanced care plan - reviewed from chart or in discussion with pt or family    Review of Systems:   No headache, eye, ear nose, throat problems; no coughing or wheezing or shortness of breath, No chest pain or orthopnea, no abdominal pain, nausea or vomiting, No pain in the body or extremities, no psychiatric, neurological, endocrine, hematological or cardiac complaints except as noted above.       Physical Exam:   Blood pressure 156/73, pulse (!) 55, temperature 97.2 F (36.2 C), temperature source Axillary, resp. rate 15, height 1.778 m (5\' 10" ), weight (!) 147.6 kg (325 lb 8 oz), last menstrual period 10/15/2018, SpO2 99%.    Neuro:  Level of consciousness:  Alert and appropriate  Oriented:  X 3  Cognition:  Intact naming, recognition, concentration and following complex commands  Cranial Nerves: Left facial weakness   Strength:  No upper extremity drift, 5/5  strength x 4 extremities  Coordination:  Intact FTN testing  Reflexes:  +2 throughout, down going toes bil  Sensation: Intact x 4 extremities to LT, temp, vibration  Gait:  Deferred       Agree with above, except for reduced nasolabial fold flattening on the left, preserved forehead elevation and eye closure tongue is midline light touch temperature intact otherwise on the face arm and the legs no drift noted.    Labs:     Recent Labs   Lab 10/31/22  0556 10/30/22  1411   Glucose 141* 62*   BUN 9 12   Creatinine 0.8 0.9   Calcium 9.1 9.8   Sodium 143 145   Potassium 3.6 3.3*   Chloride 108 110   CO2 28 29   Albumin 3.6  3.6 4.0   Phosphorus 3.6 2.6   Magnesium 2.2 2.2   AST (SGOT) 18 22   ALT 20 23   Bilirubin, Total 1.0 0.9   Alkaline Phosphatase 106 122*     Recent Labs   Lab 10/31/22  0556 10/30/22  1411   WBC 7.33 10.47*   Hemoglobin 13.6 13.9   Hematocrit 41.2 41.6   MCV 95.6 95.2   MCH 31.6 31.8   MCHC 33.0 33.4   Platelet Count 146 158         Recent Labs     10/31/22  0554   PTT 33   PT 12.9   INR 1.1          Radiology Results (24 Hour)       Procedure Component Value Units Date/Time    MR Angiogram Neck WO Contrast [[id number]] Collected: 10/30/22 2148    Order Status: Completed Updated: 10/30/22 2154    Narrative:      HISTORY: Stroke, follow up. cva.     COMPARISON: 07/29/2021 intracranial and neck amount exam.      TECHNIQUE:  MR ANGIOGRAM of the performed without contrast. MR ANGIOGRAM of  the NECK performed without intravenous contrast. Examinations performed on  a Tesla scanner. 3D maximum intensity projection (MIP) images were created  and reviewed. Internal carotid stenosis was determined based on NASCET  criteria.  CONTRAST: None.    FINDINGS:    The right common carotid artery is patent. There is no stenosis of the  proximal right internal carotid artery. The distal and intracranial right  internal carotid artery is patent.  The right anterior and middle cerebral arteries are patent.    The left  common carotid artery is patent.  There is no stenosis of the  proximal left internal carotid artery. The distal and intracranial left  internal carotid artery is patent.  The left anterior and middle cerebral arteries are patent.    The vertebral arteries are codominant. The cervical vertebral arteries are  patent.   The intracranial vertebral, basilar, and posterior cerebral arteries are  patent.      Impression:         1.There is no stenosis of the proximal right internal carotid artery based  on NASCET criteria.  2.There is no stenosis of the proximal left internal carotid artery based  on NASCET criteria.  3.The cervical vertebral arteries are patent.  4.The intracranial arteries are patent, no proximal intracranial large  vessel occlusion.         2     Alex [name], MD  10/30/2022 9:52 PM    MR Angiogram Head WO Contrast [[id number]] Collected: 10/30/22 2148    Order Status: Completed Updated: 10/30/22 2154    Narrative:      HISTORY: Stroke, follow up. cva.     COMPARISON: 07/29/2021 intracranial and neck amount exam.      TECHNIQUE:  MR ANGIOGRAM of the performed without contrast. MR ANGIOGRAM of  the NECK performed without intravenous contrast. Examinations performed on  a Tesla scanner. 3D maximum intensity projection (MIP) images were created  and reviewed. Internal carotid stenosis was determined based on NASCET  criteria.  CONTRAST: None.    FINDINGS:    The right common carotid artery is patent. There is no stenosis of the  proximal right internal carotid artery. The distal and intracranial right  internal carotid artery is patent.  The right anterior and middle cerebral arteries are patent.    The left common carotid artery is patent.  There is no stenosis of the  proximal left internal carotid artery. The distal and intracranial left  internal carotid artery is patent.  The left anterior and middle cerebral arteries are patent.    The vertebral arteries are codominant. The cervical vertebral  arteries are  patent.   The intracranial vertebral, basilar, and posterior cerebral arteries are  patent.      Impression:         1.There is no stenosis of the proximal right internal carotid artery based  on NASCET criteria.  2.There is no stenosis of the proximal left internal carotid artery based  on NASCET criteria.  3.The cervical vertebral arteries are patent.  4.The intracranial arteries are patent, no proximal intracranial large  vessel occlusion.         2     Alex [name], MD  10/30/2022 9:52 PM    MRI brain without contrast [[id number]] Collected: 10/30/22 2146    Order Status: Completed Updated: 10/30/22 2150    Narrative:      HISTORY: Neuro deficit, acute, stroke suspected. stroke suspected.     COMPARISON: 09/09/2022 MRI brain, 10/30/2022 CT head.     TECHNIQUE: MRI of the brain performed on a  Tesla scanner without  intravenous contrast.     CONTRAST: None.    FINDINGS:  The diffusion weighted images are normal without acute ischemia.  The brain  is unremarkable for age, with unchanged a few punctate FLAIR  hyperintensity of the white matter of cerebral hemisphere.  There is no mass hemorrhage or extra-axial fluid collection.   The ventricles and the basal cisterns are normal without mass effect.  The intracranial vessels are grossly normal.   The paranasal sinuses are normal.       Impression:         1.Negative MRI brain. No acute infarction.    Alex [name], MD  10/30/2022 9:47 PM    CT Head WO Contrast [[id number]] Collected: 10/30/22 1559    Order Status: Completed Updated: 10/30/22 1606    Narrative:      HISTORY: Possible facial droop.     COMPARISON: CT head and MR brain 09/09/2022.    TECHNIQUE: CT of the head performed without intravenous contrast. The  following dose reduction techniques were utilized: automated exposure  control and/or adjustment of the mA and/or KV according to patient size,  and the use of an iterative reconstruction technique.    CONTRAST:  None.    FINDINGS:    No evidence of acute intracranial hemorrhage or acute territorial infarct.  No extra-axial fluid collection, midline shift, or mass effect.     No acute hydrocephalus. Patent basal cisterns.     Scalp soft tissues are unremarkable. No osseous lesion or fracture. Orbits  are unremarkable. No significant paranasal sinus mucosal disease. Mastoid  air cells are clear.       Impression:           No acute intracranial abnormality.    [name], MD  10/30/2022 4:04 PM    XR Chest PA Or AP [[id number]] Collected: 10/30/22 1443    Order Status: Completed Updated: 10/30/22 1446    Narrative:      HISTORY: Chest pain     COMPARISON: 6/28    FINDINGS: Normal cardiac size. Hypoinflated lungs. Small foci of  atelectasis in the lung bases. No pneumothorax, consolidation, or pleural  effusion. No evidence of pulmonary edema      Impression:       Small foci of basilar atelectasis.    [name], MD  10/30/2022 2:44 PM             All recent brain and spine imaging (MRI, CT) personally reviewed.    Chart reviewed    This note was generated by the Epic EMR system/Speech recognition and may contain inherent errors or omissions not intended by the user. Grammatical errors, random word insertions, deletions and pronoun errors  are occasional consequences of this technology due to software limitations.   Not all errors are caught or corrected. If there are questions or concerns about the content of this note or information contained within the body of this dictation they should be addressed directly with the author for clarification.    Signed by: Geralynn Rile, NP,   Spectralink: [id number]      Answering Service: [id number]             [1]   Past Medical History:  Diagnosis Date    Hyperlipidemia     Hypertension     Migraine     Morbid obesity     Palpitations     loop recorder placed in 2019, had to be removed after a month due to misplacement and keloid formation per patient    Seizures  Stroke     2015    Thoracic aortic aneurysm     TIA (transient ischemic attack) 2017   [2]   Allergies  Allergen Reactions    Contrast [Iodinated Contrast Media] Anaphylaxis     Patient woke up in ICU after having CT with contrast, last remembers being in CT    Fentanyl Anaphylaxis     Throat swelling    Fioricet [Butalbital-Apap-Caffeine] Hives    Flexeril [Cyclobenzaprine] Edema     Causes throat swelling per Pt report    Motrin [Ibuprofen] Anaphylaxis     Throat swelling    Tramadol Anaphylaxis     Throat swelling    Bentyl [Dicyclomine] Edema    Percocet [Oxycodone-Acetaminophen] Hives    Shellfish-Derived Products Hives    Toradol [Ketorolac Tromethamine] Hives    Tylenol [Acetaminophen] Hives   [3]   Social History  Socioeconomic History    Marital status: Married   Tobacco Use    Smoking status: Never    Smokeless tobacco: Never   Vaping Use    Vaping status: Never Used   Substance and Sexual Activity    Alcohol use: No    Drug use: No     Social Determinants of Psychologist, prison and probation services Strain: Low Risk  (09/09/2022)    Overall Financial Resource Strain (CARDIA)     Difficulty of Paying Living Expenses: Not hard at all   Food Insecurity: No Food Insecurity (10/30/2022)    Hunger Vital Sign     Worried About Running Out of Food in the Last Year: Never true     Ran Out of Food in the Last Year: Never true   Transportation Needs: No Transportation Needs (10/30/2022)    PRAPARE - Therapist, art (Medical): No     Lack of Transportation (Non-Medical): No   Physical Activity: Sufficiently Active (09/09/2022)    Exercise Vital Sign     Days of Exercise per Week: 2 days     Minutes of Exercise per Session: 120 min   Recent Concern: Physical Activity - Insufficiently Active (06/30/2022)    Exercise Vital Sign     Days of Exercise per Week: 3 days     Minutes of Exercise per Session: 30 min   Stress: No Stress Concern Present (06/30/2022)    [employer] of Occupational Health -  Occupational Stress Questionnaire     Feeling of Stress : Not at all   Social Connections: Socially Integrated (06/30/2022)    Social Connection and Isolation Panel [NHANES]     Frequency of Communication with Friends and Family: Three times a week     Frequency of Social Gatherings with Friends and Family: Once a week     Attends Religious Services: More than 4 times per year     Active Member of [employer] or Organizations: Yes     Attends Banker Meetings: More than 4 times per year     Marital Status: Married   Catering manager Violence: Not At Risk (10/30/2022)    Humiliation, Afraid, Rape, and Kick questionnaire     Fear of Current or Ex-Partner: No     Emotionally Abused: No     Physically Abused: No     Sexually Abused: No   Housing Stability: Unknown (10/30/2022)    Housing Stability Vital Sign     Unable to Pay for Housing in the Last Year: No     Homeless in  the Last Year: No

## 2022-10-31 NOTE — Nursing Progress Note (Signed)
Pt off unit to echo.

## 2022-10-31 NOTE — Plan of Care (Signed)
Pt NIH 2 at beginning of shift for left facial droop and mild slurred speech.  Neuro checks q4 with no changes.  VSS.  IV dilaudid administered for 8/10 CP and magnesium for headache.  Pt takes medications whole in applesauce. IVF continued per order.  Plan of care reviewed, all questions/concerns addressed.  Problem: Every Day - Stroke  Goal: Core/Quality measure requirements - Daily  Outcome: Progressing  Flowsheets (Taken 10/31/2022 1523)  Core/Quality measure requirements - Daily:   VTE Prevention: Ensure anticoagulant(s) administered and/or anti-embolism stockings/devices documented by end of day 2   Ensure antithrombotic administered or contraindication documented by LIP by end of day 2   Once lipid panel has resulted, check LDL. Contact provider for statin order if LDL > 70 (or ensure contraindication documented by LIP).   Continue stroke education (must include Modifiable Risk Factors, Warning Signs and Symptoms of Stroke, Activation of Emergency Medical System and Follow-up Appointments). Ensure handout has been given and documented.  Goal: Neurological status is stable or improving  Outcome: Progressing  Flowsheets (Taken 10/31/2022 1523)  Neurological status is stable or improving:   Monitor/assess/document neurological assessment (Stroke: every 4 hours)   Re-assess NIH Stroke Scale for any change in status   Monitor/assess NIH Stroke Scale   Observe for seizure activity and initiate seizure precautions if indicated   Perform CAM Assessment  Goal: Stable vital signs and fluid balance  Outcome: Progressing  Flowsheets (Taken 10/31/2022 1523)  Stable vital signs and fluid balance:   Position patient for maximum circulation/cardiac output   Monitor and assess vitals every 4 hours or as ordered and hemodynamic parameters   Monitor intake and output. Notify LIP if urine output is < 30 mL/hour.   Apply telemetry monitor as ordered   Encourage oral fluid intake  Goal: Patient's risk of aspiration will be  minimized  Outcome: Progressing  Flowsheets (Taken 10/31/2022 1523)  Patient's risk of aspiration will be minimized:   Place swallow precaution signage above bed   Monitor/assess for signs of aspiration (tachypnea, cough, wheezing, clearing throat, hoarseness after eating, decrease in SaO2   Order modified texture diet as recommend by Speech Pathologist   Thicken liquids as recommended by Speech Pathologist   Assess and monitor ability to swallow   Keep head of bed up a minimum of 30 degrees when hemodynamically stable   Place patient up in chair to eat, if possible   HOB up 90 degrees to eat if unable to be OOB   Instruct patient to take small bites   Instruct patient to take small single sips of liquid   Consult/Collaborate with Speech Pathologist for dysphagia   Supervise patient during oral intake  Goal: Mobility/Activity is maintained at optimal level for patient  Outcome: Progressing  Flowsheets (Taken 10/31/2022 1523)  Mobility/activity is maintained at optimal level for patient:   Encourage independent activity per ability   Consult/collaborate with Physical Therapy and/or Occupational Therapy

## 2022-10-31 NOTE — Progress Notes (Signed)
MEDICINE PROGRESS NOTE  [facility], DIVISION OF HOSPITALIST MEDICINE   [facility]   [place] Pager: [phone]      Date Time: 10/31/22   Patient Name: [name] Cook  Attending Physician: Illene Regulus, MD  Today's date: 10/31/2022  Hospital Day: 2    CC: CVA (cerebrovascular accident due to intracerebral hemorrhage)    Biak Voeller is 47 y.o. F with hx of hypertension,hyperlipidemia, prediabetes,CVA presenting with chest pain ,sob, and left side headache.She was found to have right side facial droop.Facial droop started while in ED. 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  # Hx of stroke  # Headache s/p Mg  - A1C 5.7, LDL 116  - ECG - NSR  - CTH negative  - MRI head, MRA head/neck - no acute abnormality  - started on valtrex and prednisone for seven days  - neurology considering repeat MRI with contrast and/or thin slices  - c/w ASA and statin   - optimize BP control  -c/w telemetry    # Essential hypertension  - resume amlodipine 5 mg/d and lisinopril 20 mg/d    # Hyperlipidemia  - c/w statin    # Seizure disorder  - c/w Keppra    # Atypical chest pain  - ECT with non specific T wave changes, NSR,HS trop neg x 2  - CXR small bibasilar atelectasis  - normal oxygen saturation  - improving    # Prediabetes   - monitor sugars  - reinforce diet modification        DVT prophylaxis :Heparin    Disposition : 8/21    Case discussed with:Pt, staff ,neurology      Subjective/ROS     Patient seen and examined.No new complaints, continues to have left side headache  Reports intermittent CP but improving, no sob/dizziness      All other systems reviewed and are negative.      Physical Exam:       Temp:  [97.2 F (36.2 C)-98 F (36.7 C)] 97.6 F (36.4 C)  Heart Rate:  [55-84] 84  Resp Rate:  [15-29] 17  BP: (156-227)/(73-121) 167/99  Intake and Output Summary-last 24 Hrs:  No intake/output data recorded.  No data recorded by O2  Device: None (Room air)    Vitals and nursing note reviewed. Exam conducted with a chaperone present.   Gen: Normal appearance, no distress   Mouth: Mucous membranes are moist.   Eyes: Normal conjunctivae, NIS  Cardiovascular: S1S2 RRR   Pulmonary: normal effort, CTAB  Abdomen: S/NT/ND/+BS  MSK: no LE edema  Neurological: Aox3, no gross abnormality ,+Right facial droop  Psych: normal behavior, normal mood       Meds:   Medications were reviewed:  Scheduled Meds:  Current Facility-Administered Medications   Medication Dose Route Frequency    aspirin  81 mg Oral Daily    Or    aspirin  300 mg Rectal Daily    atorvastatin  40 mg Oral QHS    famotidine  20 mg Intravenous Q12H SCH    heparin (porcine)  5,000 Units Subcutaneous Q8H SCH    insulin lispro  1-3 Units Subcutaneous QHS    insulin lispro  1-5 Units Subcutaneous TID AC    levETIRAcetam  500 mg Oral Q12H SCH    magnesium sulfate  1 g Intravenous BID    ondansetron  4 mg Intravenous Once    predniSONE  60 mg Oral Daily    valACYclovir HCL  1,000 mg Oral TID     Continuous Infusions:   dextrose 5 % and 0.45 % NaCl with KCl 20 mEq 75 mL/hr at 10/31/22 0050     PRN Meds:.dextrose **OR** dextrose **OR** dextrose **OR** glucagon (rDNA), diphenhydrAMINE, hydrALAZINE, HYDROmorphone, labetalol, ondansetron **OR** ondansetron  DVT prophylaxis Chemical and Mechanical        Lines:     Patient Lines/Drains/Airways Status       Active PICC Line / CVC Line / PIV Line / Drain / Airway / Intraosseous Line / Epidural Line / ART Line / Line / Wound / Pressure Ulcer / NG/OG Tube       Name Placement date Placement time Site Days    Peripheral IV 10/31/22 20 G Anterior;Distal;Left Upper Arm 10/31/22  0046  Upper Arm  less than 1    Peripheral IV 10/31/22 20 G Anterior;Left Forearm 10/31/22  0047  Forearm  less than 1                       Labs/Radiology:   Imaging personally reviewed, including: all available   MR Angiogram Neck WO Contrast    Result Date: 10/30/2022   1.There is no  stenosis of the proximal right internal carotid artery based on NASCET criteria. 2.There is no stenosis of the proximal left internal carotid artery based on NASCET criteria. 3.The cervical vertebral arteries are patent. 4.The intracranial arteries are patent, no proximal intracranial large vessel occlusion.  2 Alex [name], MD 10/30/2022 9:52 PM    MR Angiogram Head WO Contrast    Result Date: 10/30/2022   1.There is no stenosis of the proximal right internal carotid artery based on NASCET criteria. 2.There is no stenosis of the proximal left internal carotid artery based on NASCET criteria. 3.The cervical vertebral arteries are patent. 4.The intracranial arteries are patent, no proximal intracranial large vessel occlusion.  2 Alex [name], MD 10/30/2022 9:52 PM    MRI brain without contrast    Result Date: 10/30/2022   1.Negative MRI brain. No acute infarction. Alex [name], MD 10/30/2022 9:47 PM    CT Head WO Contrast    Result Date: 10/30/2022   No acute intracranial abnormality. [name], MD 10/30/2022 4:04 PM    XR Chest PA Or AP    Result Date: 10/30/2022   Small foci of basilar atelectasis. [name], MD 10/30/2022 2:44 PM    XR Chest PA Lateral And Apical Lordotic    Result Date: 10/08/2022  IMPRESSION: No acute cardiopulmonary abnormality. Thank you for this referral to [facility] [place], [employer] in partnership with [facility].   For further consultation, please call [phone] or [phone] (phones answered 24/7/365).  Electronically Signed by Francina Ames, MD 10/08/2022 8:11 PM   Recent Labs     10/31/22  1545 10/31/22  1201 10/31/22  0736 10/30/22  2315   Whole Blood Glucose POCT 124* 106* 132* 107*     Recent Labs   Lab 10/31/22  0556 10/30/22  1411   Sodium 143 145   Potassium 3.6 3.3*   Chloride 108 110   BUN 9 12   Creatinine 0.8 0.9   GFR >60.0 >60.0   Glucose 141* 62*   Calcium 9.1 9.8     Recent Labs   Lab 10/31/22  0556  10/30/22  1411   WBC 7.33 10.47*   Hemoglobin 13.6 13.9   Hematocrit 41.2 41.6   Platelet Count 146 158     Recent Labs   Lab 10/31/22  0554   PT 12.9   INR 1.1   PTT 33     Recent Labs   Lab 10/31/22  0556 10/30/22  1411   Alkaline Phosphatase 106 122*   Bilirubin, Total 1.0 0.9   Bilirubin Direct 0.3  --    ALT 20 23   AST (SGOT) 18 22     MR Angiogram Neck WO Contrast    Result Date: 10/30/2022   1.There is no stenosis of the proximal right internal carotid artery based on NASCET criteria. 2.There is no stenosis of the proximal left internal carotid artery based on NASCET criteria. 3.The cervical vertebral arteries are patent. 4.The intracranial arteries are patent, no proximal intracranial large vessel occlusion.  2 Alex [name], MD 10/30/2022 9:52 PM    MR Angiogram Head WO Contrast    Result Date: 10/30/2022   1.There is no stenosis of the proximal right internal carotid artery based on NASCET criteria. 2.There is no stenosis of the proximal left internal carotid artery based on NASCET criteria. 3.The cervical vertebral arteries are patent. 4.The intracranial arteries are patent, no proximal intracranial large vessel occlusion.  2 Alex [name], MD 10/30/2022 9:52 PM    MRI brain without contrast    Result Date: 10/30/2022   1.Negative MRI brain. No acute infarction. Alex [name], MD 10/30/2022 9:47 PM    CT Head WO Contrast    Result Date: 10/30/2022   No acute intracranial abnormality. [name], MD 10/30/2022 4:04 PM    XR Chest PA Or AP    Result Date: 10/30/2022   Small foci of basilar atelectasis. [name], MD 10/30/2022 2:44 PM    XR Chest PA Lateral And Apical Lordotic    Result Date: 10/08/2022  IMPRESSION: No acute cardiopulmonary abnormality. Thank you for this referral to [facility] [place], [employer] in partnership with [facility].   For further consultation, please call [phone] or [phone] (phones answered 24/7/365).   Electronically Signed by Francina Ames, MD 10/08/2022 8:11 PM        This note was generated by the Epic EMR system/ Dragon speech recognition and may contain inherent errors or omissions not intended by the user. Grammatical errors, random word insertions, deletions and pronoun errors  are occasional consequences of this technology due to software limitations. Not all errors are caught or corrected. If there are questions or concerns about the content of this note or information contained within the body of this dictation they should be addressed directly with the author for clarification.  Signed by: Illene Regulus, MD

## 2022-10-31 NOTE — PT Progress Note (Signed)
[facility]  Physical Therapy Attempt Note    Patient:  [name] Cook MRN#:  [id number]    Unit:  [facility] INTERMEDIATE CARE Room/Bed:  MI628/MI628-01    PT Cancellation: Order     PT Order Cancellation Reason: Not clinically indicated at this time (comment required) - Rehab decision  PT eval orders received. Chart review completed. Patient not seen for physical therapy evaluation as pt is at/near functional baseline of independent with all transfers, and mobility per discussion with treating RN and with patient. No acute PT needs identified, d/c PT. Will need new orders if functional status changes.          Signature:   Lennox Pippins, PT  10/31/2022  12:33 PM       (For scheduling questions, please contact rehab tech x [phone])

## 2022-11-01 ENCOUNTER — Observation Stay: Payer: No Typology Code available for payment source

## 2022-11-01 LAB — CBC
Absolute nRBC: 0 10*3/uL (ref <=0.00)
Hematocrit: 41.1 % (ref 34.7–43.7)
Hemoglobin: 13.6 g/dL (ref 11.4–14.8)
MCH: 31.9 pg (ref 25.1–33.5)
MCHC: 33.1 g/dL (ref 31.5–35.8)
MCV: 96.5 fL — ABNORMAL HIGH (ref 78.0–96.0)
MPV: 13.4 fL — ABNORMAL HIGH (ref 8.9–12.5)
Platelet Count: 145 10*3/uL (ref 142–346)
RBC: 4.26 10*6/uL (ref 3.90–5.10)
RDW: 13 % (ref 11–15)
WBC: 7.75 10*3/uL (ref 3.10–9.50)
nRBC %: 0 /100 WBC (ref <=0.0)

## 2022-11-01 LAB — RENAL FUNCTION PANEL
Albumin: 3.6 g/dL (ref 3.5–5.0)
Anion Gap: 9 (ref 5.0–15.0)
BUN: 10 mg/dL (ref 7–21)
CO2: 22 mEq/L (ref 17–29)
Calcium: 8.9 mg/dL (ref 8.5–10.5)
Chloride: 110 mEq/L (ref 99–111)
Creatinine: 0.9 mg/dL (ref 0.4–1.0)
GFR: >60 mL/min/{1.73_m2} (ref >=60.0)
Glucose: 121 mg/dL — ABNORMAL HIGH (ref 70–100)
Phosphorus: 3.6 mg/dL (ref 2.3–4.7)
Potassium: 4 mEq/L (ref 3.5–5.3)
Sodium: 141 mEq/L (ref 135–145)

## 2022-11-01 LAB — WHOLE BLOOD GLUCOSE POCT
Whole Blood Glucose POCT: 237 mg/dL — ABNORMAL HIGH (ref 70–100)
Whole Blood Glucose POCT: 128 mg/dL — ABNORMAL HIGH (ref 70–100)
Whole Blood Glucose POCT: 165 mg/dL — ABNORMAL HIGH (ref 70–100)
Whole Blood Glucose POCT: 151 mg/dL — ABNORMAL HIGH (ref 70–100)

## 2022-11-01 LAB — MAGNESIUM: Magnesium: 2.3 mg/dL (ref 1.6–2.6)

## 2022-11-01 MED ORDER — FAMOTIDINE 20 MG PO TABS
20.0000 mg | ORAL_TABLET | Freq: Two times a day (BID) | ORAL | Status: DC
Start: 2022-11-01 — End: 2022-11-05
  Administered 2022-11-01 – 2022-11-04 (×7): 20 mg via ORAL
  Filled 2022-11-01 (×8): qty 1

## 2022-11-01 MED ORDER — HYDRALAZINE HCL 25 MG PO TABS
25.0000 mg | ORAL_TABLET | Freq: Three times a day (TID) | ORAL | Status: DC
Start: 2022-11-01 — End: 2022-11-03
  Administered 2022-11-01 – 2022-11-03 (×7): 25 mg via ORAL
  Filled 2022-11-01 (×7): qty 1

## 2022-11-01 MED ORDER — TECHNETIUM TC 99M ALBUMIN AGGREGATED
6.5000 | Freq: Once | Status: AC | PRN
Start: 2022-11-01 — End: 2022-11-01
  Administered 2022-11-01: 7 via INTRAVENOUS

## 2022-11-01 MED ORDER — DIPHENHYDRAMINE HCL 50 MG/ML IJ SOLN
25.0000 mg | Freq: Four times a day (QID) | INTRAMUSCULAR | Status: DC | PRN
Start: 2022-11-01 — End: 2022-11-05

## 2022-11-01 MED ORDER — VALPROATE SODIUM 100 MG/ML IV SOLN
500.0000 mg | Freq: Three times a day (TID) | INTRAVENOUS | Status: DC
Start: 2022-11-01 — End: 2022-11-04
  Filled 2022-11-01 (×11): qty 5

## 2022-11-01 NOTE — Progress Notes (Signed)
Case Management Progress Note     Date: 11/01/22    Patient: [facility] Problems    Diagnosis    CVA (cerebrovascular accident due to intracerebral hemorrhage)    Pre-diabetes    Acute hypokalemia    Right sided weakness    HTN (hypertension)    HLD (hyperlipidemia)    History of stroke    Morbid obesity with BMI of 45.0-49.9, adult    Mouth droop due to facial weakness    Seizures    Chest pain       Length of stay: Hospital Day 0      Discharge plan: Home  Transport: family  Anticipated date of discharge: 1 day  Barriers to discharge: pending repeat MRI    CM will continue to follow for discharge planning needs

## 2022-11-01 NOTE — Plan of Care (Signed)
Problem: Pain interferes with ability to perform ADL  Goal: Pain at adequate level as identified by patient  11/01/2022 2025 by Zetang Epse Jua, Tammy Wickliffe, RN  Outcome: Progressing  Flowsheets (Taken 11/01/2022 2025)  Pain at adequate level as identified by patient:   Identify patient comfort function goal   Assess for risk of opioid induced respiratory depression, including snoring/sleep apnea. Alert healthcare team of risk factors identified.   Assess pain on admission, during daily assessment and/or before any "as needed" intervention(s)   Reassess pain within 30-60 minutes of any procedure/intervention, per Pain Assessment, Intervention, Reassessment (AIR) Cycle   Evaluate patient's satisfaction with pain management progress   Evaluate if patient comfort function goal is met   Offer non-pharmacological pain management interventions  11/01/2022 2024 by Dondra Spry, Jozsef Wescoat, RN  Outcome: Progressing  Flowsheets (Taken 11/01/2022 2024)  Pain at adequate level as identified by patient:   Identify patient comfort function goal   Assess for risk of opioid induced respiratory depression, including snoring/sleep apnea. Alert healthcare team of risk factors identified.   Assess pain on admission, during daily assessment and/or before any "as needed" intervention(s)   Reassess pain within 30-60 minutes of any procedure/intervention, per Pain Assessment, Intervention, Reassessment (AIR) Cycle   Evaluate patient's satisfaction with pain management progress   Consult/collaborate with Pain Service   Offer non-pharmacological pain management interventions   Evaluate if patient comfort function goal is met     Problem: Side Effects from Pain Analgesia  Goal: Patient will experience minimal side effects of analgesic therapy  Outcome: Progressing  Flowsheets (Taken 11/01/2022 2024)  Patient will experience minimal side effects of analgesic therapy:   Monitor/assess patient's respiratory status (RR depth, effort, breath  sounds)   Evaluate for opioid-induced sedation with appropriate assessment tool (i.e. POSS)   Prevent/manage side effects per LIP orders (i.e. nausea, vomiting, pruritus, constipation, urinary retention, etc.)   Assess for changes in cognitive function     Problem: Every Day - Stroke  Goal: Core/Quality measure requirements - Daily  11/01/2022 2025 by Ivan Anchors Epse Jua, Gar Glance, RN  Outcome: Progressing  Flowsheets (Taken 11/01/2022 2025)  Core/Quality measure requirements - Daily:   VTE Prevention: Ensure anticoagulant(s) administered and/or anti-embolism stockings/devices documented by end of day 2   Continue stroke education (must include Modifiable Risk Factors, Warning Signs and Symptoms of Stroke, Activation of Emergency Medical System and Follow-up Appointments). Ensure handout has been given and documented.   Once lipid panel has resulted, check LDL. Contact provider for statin order if LDL > 70 (or ensure contraindication documented by LIP).   Ensure antithrombotic administered or contraindication documented by LIP by end of day 2  11/01/2022 2024 by Zetang Epse Jua, Joakim Huesman, RN  Outcome: Progressing  Flowsheets (Taken 11/01/2022 2024)  Core/Quality measure requirements - Daily:   Once lipid panel has resulted, check LDL. Contact provider for statin order if LDL > 70 (or ensure contraindication documented by LIP).   Ensure antithrombotic administered or contraindication documented by LIP by end of day 2   Continue stroke education (must include Modifiable Risk Factors, Warning Signs and Symptoms of Stroke, Activation of Emergency Medical System and Follow-up Appointments). Ensure handout has been given and documented.   VTE Prevention: Ensure anticoagulant(s) administered and/or anti-embolism stockings/devices documented by end of day 2  Goal: Neurological status is stable or improving  Outcome: Progressing  Flowsheets (Taken 11/01/2022 2025)  Neurological status is stable or improving:    Monitor/assess/document neurological assessment (Stroke: every 4 hours)  Monitor/assess NIH Stroke Scale   Observe for seizure activity and initiate seizure precautions if indicated   Re-assess NIH Stroke Scale for any change in status   Perform CAM Assessment  Goal: Stable vital signs and fluid balance  Outcome: Progressing  Flowsheets (Taken 11/01/2022 2025)  Stable vital signs and fluid balance:   Position patient for maximum circulation/cardiac output   Monitor and assess vitals every 4 hours or as ordered and hemodynamic parameters   Encourage oral fluid intake   Monitor intake and output. Notify LIP if urine output is < 30 mL/hour.   Apply telemetry monitor as ordered  Goal: Mobility/Activity is maintained at optimal level for patient  Outcome: Progressing  Flowsheets (Taken 11/01/2022 2025)  Mobility/activity is maintained at optimal level for patient: Encourage independent activity per ability     Problem: Safety  Goal: Patient will be free from injury during hospitalization  Outcome: Progressing  Flowsheets (Taken 11/01/2022 2025)  Patient will be free from injury during hospitalization:   Provide and maintain safe environment   Assess patient's risk for falls and implement fall prevention plan of care per policy   Ensure appropriate safety devices are available at the bedside   Use appropriate transfer methods   Include patient/ family/ care giver in decisions related to safety   Hourly rounding  Goal: Patient will be free from infection during hospitalization  Outcome: Progressing  Flowsheets (Taken 11/01/2022 2025)  Free from Infection during hospitalization:   Assess and monitor for signs and symptoms of infection   Monitor lab/diagnostic results   Monitor all insertion sites (i.e. indwelling lines, tubes, urinary catheters, and drains)   Encourage patient and family to use good hand hygiene technique     Problem: Pain  Goal: Pain at adequate level as identified by patient  11/01/2022 2025 by Zetang Epse  Jua, Branden Shallenberger, RN  Outcome: Progressing  Flowsheets (Taken 11/01/2022 2025)  Pain at adequate level as identified by patient:   Identify patient comfort function goal   Assess for risk of opioid induced respiratory depression, including snoring/sleep apnea. Alert healthcare team of risk factors identified.   Assess pain on admission, during daily assessment and/or before any "as needed" intervention(s)   Reassess pain within 30-60 minutes of any procedure/intervention, per Pain Assessment, Intervention, Reassessment (AIR) Cycle   Evaluate patient's satisfaction with pain management progress   Evaluate if patient comfort function goal is met   Offer non-pharmacological pain management interventions  11/01/2022 2024 by Dondra Spry, Delaina Fetsch, RN  Outcome: Progressing  Flowsheets (Taken 11/01/2022 2024)  Pain at adequate level as identified by patient:   Identify patient comfort function goal   Assess for risk of opioid induced respiratory depression, including snoring/sleep apnea. Alert healthcare team of risk factors identified.   Assess pain on admission, during daily assessment and/or before any "as needed" intervention(s)   Reassess pain within 30-60 minutes of any procedure/intervention, per Pain Assessment, Intervention, Reassessment (AIR) Cycle   Evaluate patient's satisfaction with pain management progress   Consult/collaborate with Pain Service   Offer non-pharmacological pain management interventions   Evaluate if patient comfort function goal is met

## 2022-11-01 NOTE — SLP Progress Note (Signed)
Speech Language Pathology  [facility]  Attempt Note     Patient:  [name] Cook          MRN#:  [id number]     Unit:  [facility] INTERMEDIATE CARE     Room/Bed:  MW413/KG401-0        SLP Cancellation: Order + Visit  SLP Order Cancellation Reason: Not clinically indicated at this time (comment required) - Rehab decision     F/u visit planned for today. Pt reporting no ongoing concerns regarding swallowing or motor speech; therefore, no SLP visit warranted. [place] SLP order; please re consult as needed during stay.

## 2022-11-01 NOTE — Progress Notes (Signed)
Date Time: 11/01/22 1:04 PM  Patient Name: [name] Cook  Attending Physician: Illene Regulus, MD  Patient Class: Observation  Hospital Day: 0            NEUROLOGY PROGRESS NOTE             Assessment/Plan   Possible left-sided partial Bell's palsy second brain MRI negative this was done without contrast because patient is allergic  Still ongoing left-sided headache of note patient with no prior history of headaches  Elevated blood pressure readings,?  Hypertension related head pain    MRI brain MRA head and neck are negative      Remain on valacyclovir and prednisone  Will retry magnesium Benadryl Toradol  Will try Depakote  If headache does not get better, may need to consider getting a spinal tap especially given she does not really have a prior history of headaches                   Subjective:   Patient Seen and Examined.  No change in facial asymmetry headache remains, left temporoparietal, vertex region, throbbing, at 6/10 intensity    Review of Systems:   No eye, ear nose, throat problems; no coughing or wheezing or shortness of breath,   No chest pain or orthopnea, no abdominal pain, nausea or vomiting,   No pain in the body or extremities, no psychiatric, neurological, endocrine, hematological   or cardiac complaints except as noted above.          Physical Exam:   BP (!) 189/99   Pulse (!) 58   Temp 97.3 F (36.3 C) (Oral)   Resp 18   Ht 1.778 m (5\' 10" )   Wt (!) 147.6 kg (325 lb 8 oz)   LMP 10/15/2018 (Exact Date)   SpO2 98%   BMI 46.70 kg/m     Neuro:  Level of consciousness: Awake alert oriented x 3 looks somewhat uncomfortable left facial asymmetry remains unchanged moving both upper lower extremities well  Meds:      Scheduled Meds: PRN Meds:    amLODIPine, 5 mg, Oral, Daily  aspirin, 81 mg, Oral, Daily   Or  aspirin, 300 mg, Rectal, Daily  atorvastatin, 40 mg, Oral, QHS  famotidine, 20 mg, Oral, Q12H SCH  heparin (porcine), 5,000 Units, Subcutaneous, Q8H SCH  hydrALAZINE, 25 mg, Oral,  Q8H SCH  insulin lispro, 1-3 Units, Subcutaneous, QHS  insulin lispro, 1-5 Units, Subcutaneous, TID AC  levETIRAcetam, 500 mg, Oral, Q12H SCH  lisinopril, 20 mg, Oral, Daily  magnesium sulfate, 1 g, Intravenous, BID  ondansetron, 4 mg, Intravenous, Once  predniSONE, 60 mg, Oral, Daily  valACYclovir HCL, 1,000 mg, Oral, TID        Continuous Infusions:   dextrose, 15 g of glucose, PRN   Or  dextrose, 12.5 g, PRN   Or  dextrose, 12.5 g, PRN   Or  glucagon (rDNA), 1 mg, PRN  diphenhydrAMINE, 6.25 mg, Q6H PRN  gadoterate meglumine, 20 mL, ONCE PRN  hydrALAZINE, 10 mg, Q3H PRN  HYDROmorphone, 1 mg, Q4H PRN  labetalol, 10 mg, Q15 Min PRN  LORazepam, 1 mg, Q4H PRN  ondansetron, 4 mg, Q6H PRN   Or  ondansetron, 4 mg, Q6H PRN                Labs:     Recent Labs   Lab 11/01/22  0540 10/31/22  0556 10/30/22  1411   Glucose 121* 141* 62*   BUN  10 9 12    Creatinine 0.9 0.8 0.9   Calcium 8.9 9.1 9.8   Sodium 141 143 145   Potassium 4.0 3.6 3.3*   Chloride 110 108 110   CO2 22 28 29    Albumin 3.6 3.6  3.6 4.0   Phosphorus 3.6 3.6 2.6   Magnesium 2.3 2.2 2.2   AST (SGOT)  --  18 22   ALT  --  20 23   Bilirubin, Total  --  1.0 0.9   Alkaline Phosphatase  --  106 122*     Recent Labs   Lab 11/01/22  0640 10/31/22  0556 10/30/22  1411   WBC 7.75 7.33 10.47*   Hemoglobin 13.6 13.6 13.9   Hematocrit 41.1 41.2 41.6   MCV 96.5* 95.6 95.2   MCH 31.9 31.6 31.8   MCHC 33.1 33.0 33.4   Platelet Count 145 146 158                 Lab Results   Component Value Date    TSH 3.62 10/31/2022               .ll    Recent Labs     10/31/22  0554   PTT 33   PT 12.9   INR 1.1          Radiology Results (24 Hour)       Procedure Component Value Units Date/Time    NM Lung Scan Perfusion w/Spect CT [[id number]] Resulted: 11/01/22 1122    Order Status: Sent Updated: 11/01/22 1122    MRI Brain WO Contrast [[id number]] Collected: 11/01/22 0754    Order Status: Completed Updated: 11/01/22 0804    Narrative:      HISTORY: Neuro deficit, acute, stroke suspected.      COMPARISON: MRA of the head and neck 10/30/2022. CT head 10/30/2022. MRI of  the brain 08/30/2022.    TECHNIQUE: MRI of the brain performed on a 1.5 Tesla scanner without  intravenous contrast. Examination performed per IAC protocol with high  resolution images through the internal auditory canals.     CONTRAST: None.    FINDINGS:   There is no acute hemorrhage or acute infarct. There is no worrisome  parenchymal or leptomeningeal enhancement. There is no mass effect or  midline shift. The ventricles and extra-axial spaces are appropriate for  age. Punctate foci of T2/FLAIR hyperintense signal in the left frontal  corona radiata and centrum semiovale are likely from chronic small vessel  changes.   There is usual flow void appearance in the major arteries and veins.  There is no signal abnormality within the brainstem. No mass is identified  in the cerebellopontine angle cisterns. There is normal appearance of  bilateral internal auditory canals without masses or abnormal enhancement.  The membranous labyrinth including the cochlea, vestibule, and semicircular  canals are normal. There is no superior semicircular canals dehiscence.  There is no abnormal enhancement or masses of bilateral facial nerves.  There is no sellar or suprasellar abnormality. The cerebellar tonsils are  above the foramen magnum. .  There are no suspicious calvarial or skull base lesions. The orbits are  unremarkable. The visualized paranasal sinuses are clear. The mastoid air  cells are clear.      Impression:         1.No acute intracranial abnormality.    Leota Jacobsen, MD  11/01/2022 8:02 AM             All  brain imaging (MRI, CT) personally reviewed.    Case discussed with: pt and rn               I personally reviewed all of the medications.  Medication list generated using all available resources.  Elder abuse (physical)  - negative  Advanced care plan - reviewed from chart or in discussion with pt or family        This note was  generated by the Epic EMR system/ Dragon speech recognition and may contain inherent errors or omissions not intended by the user. Grammatical errors, random word insertions, deletions and pronoun errors  are occasional consequences of this technology due to software limitations.   Not all errors are caught or corrected. If there are questions or concerns about the content of this note or information contained within the body of this dictation they should be addressed directly with the author for clarification.      Signed by: Cathe Mons, MD, MD  Spectralink: [id number]      Answering Service: [phone]

## 2022-11-01 NOTE — Progress Notes (Signed)
MEDICINE PROGRESS NOTE  [facility], DIVISION OF HOSPITALIST MEDICINE    [facility] [facility]   [place] Pager: [phone]      Date Time: 11/01/22   Patient Name: [name] Cook  Attending Physician: Illene Regulus, MD  Today's date: 11/01/2022  Hospital Day: 3    CC: CVA (cerebrovascular accident due to intracerebral hemorrhage)    [name] Cook is 47 y.o. F with hx of hypertension,hyperlipidemia, prediabetes,CVA presenting with chest pain ,sob, and left side headache.She was found to have right side facial droop.Facial droop started while in ED. 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  # Hx of stroke  # Headache s/p Mg  - A1C 5.7, LDL 116  - ECG - NSR  - CTH negative  - MRI head, MRA head/neck - no acute abnormality  - started on valtrex and prednisone for seven days  - neurology considering repeat MRI with contrast and/or thin slices  - c/w ASA and statin   - optimize BP control  -c/w telemetry  - started on depakote, magnesium,toradol and benadryl     # Essential hypertension  - resume amlodipine 5 mg/d and lisinopril 20 mg/d    # Hyperlipidemia  - c/w statin    # Seizure disorder  - c/w Keppra    # Atypical chest pain  - ECT with non specific T wave changes, NSR,HS trop neg x 2  - CXR small bibasilar atelectasis  - normal oxygen saturation  - improving    # Prediabetes   - monitor sugars  - reinforce diet modification        DVT prophylaxis :Heparin    Disposition : 8/21    Case discussed with:Pt, staff ,neurology      Subjective/ROS     Patient seen and examined.No new complaints, continues to have left side headache  CP improving, no sob/dizziness/focal weakness      All other systems reviewed and are negative.      Physical Exam:       Temp:  [97.3 F (36.3 C)-98.4 F (36.9 C)] 97.3 F (36.3 C)  Heart Rate:  [58-84] 58  Resp Rate:  [15-18] 18  BP: (162-189)/(70-99) 189/99  Intake and Output Summary-last 24 Hrs:  No  intake/output data recorded.  No data recorded by O2 Device: None (Room air)    Vitals and nursing note reviewed. Exam conducted with a chaperone present.   Gen: Normal appearance, no distress   Mouth: Mucous membranes are moist.   Eyes: Normal conjunctivae, NIS  Cardiovascular: S1S2 RRR   Pulmonary: normal effort, CTAB  Abdomen: S/NT/ND/+BS  MSK: no LE edema  Neurological: Aox3, no gross abnormality ,+Right facial droop  Psych: normal behavior, normal mood       Meds:   Medications were reviewed:  Scheduled Meds:  Current Facility-Administered Medications   Medication Dose Route Frequency    amLODIPine  5 mg Oral Daily    aspirin  81 mg Oral Daily    Or    aspirin  300 mg Rectal Daily    atorvastatin  40 mg Oral QHS    famotidine  20 mg Intravenous Q12H SCH    heparin (porcine)  5,000 Units Subcutaneous Q8H SCH    hydrALAZINE  25 mg Oral Q8H SCH    insulin lispro  1-3 Units Subcutaneous QHS    insulin lispro  1-5 Units Subcutaneous  TID AC    levETIRAcetam  500 mg Oral Q12H SCH    lisinopril  20 mg Oral Daily    magnesium sulfate  1 g Intravenous BID    ondansetron  4 mg Intravenous Once    predniSONE  60 mg Oral Daily    valACYclovir HCL  1,000 mg Oral TID     Continuous Infusions:   dextrose 5 % and 0.45 % NaCl with KCl 20 mEq 75 mL/hr at 11/01/22 0910     PRN Meds:.dextrose **OR** dextrose **OR** dextrose **OR** glucagon (rDNA), diphenhydrAMINE, gadoterate meglumine, hydrALAZINE, HYDROmorphone, labetalol, LORazepam, ondansetron **OR** ondansetron  DVT prophylaxis Chemical and Mechanical        Lines:     Patient Lines/Drains/Airways Status       Active PICC Line / CVC Line / PIV Line / Drain / Airway / Intraosseous Line / Epidural Line / ART Line / Line / Wound / Pressure Ulcer / NG/OG Tube       Name Placement date Placement time Site Days    Peripheral IV 10/31/22 20 G Anterior;Distal;Left Upper Arm 10/31/22  0046  Upper Arm  less than 1    Peripheral IV 10/31/22 20 G Anterior;Left Forearm 10/31/22  0047   Forearm  less than 1                       Labs/Radiology:   Imaging personally reviewed, including: all available   MRI Brain WO Contrast    Result Date: 11/01/2022   1.No acute intracranial abnormality. Leota Jacobsen, MD 11/01/2022 8:02 AM    MR Angiogram Neck WO Contrast    Result Date: 10/30/2022   1.There is no stenosis of the proximal right internal carotid artery based on NASCET criteria. 2.There is no stenosis of the proximal left internal carotid artery based on NASCET criteria. 3.The cervical vertebral arteries are patent. 4.The intracranial arteries are patent, no proximal intracranial large vessel occlusion.  2 Alex [name], MD 10/30/2022 9:52 PM    MR Angiogram Head WO Contrast    Result Date: 10/30/2022   1.There is no stenosis of the proximal right internal carotid artery based on NASCET criteria. 2.There is no stenosis of the proximal left internal carotid artery based on NASCET criteria. 3.The cervical vertebral arteries are patent. 4.The intracranial arteries are patent, no proximal intracranial large vessel occlusion.  2 Alex [name], MD 10/30/2022 9:52 PM    MRI brain without contrast    Result Date: 10/30/2022   1.Negative MRI brain. No acute infarction. Alex [name], MD 10/30/2022 9:47 PM    CT Head WO Contrast    Result Date: 10/30/2022   No acute intracranial abnormality. [name], MD 10/30/2022 4:04 PM    XR Chest PA Or AP    Result Date: 10/30/2022   Small foci of basilar atelectasis. [name], MD 10/30/2022 2:44 PM    XR Chest PA Lateral And Apical Lordotic    Result Date: 10/08/2022  IMPRESSION: No acute cardiopulmonary abnormality. Thank you for this referral to [facility] [place] [employer] in partnership with [facility].   For further consultation, please call [phone] or [phone] (phones answered 24/7/365).  Electronically Signed by Francina Ames, MD 10/08/2022 8:11 PM   Recent Labs     11/01/22  0753  10/31/22  2208 10/31/22  1545 10/31/22  1201   Whole Blood Glucose POCT 237* 114* 124* 106*  Recent Labs   Lab 11/01/22  0540 10/31/22  0556 10/30/22  1411   Sodium 141 143 145   Potassium 4.0 3.6 3.3*   Chloride 110 108 110   BUN 10 9 12    Creatinine 0.9 0.8 0.9   GFR >60.0 >60.0 >60.0   Glucose 121* 141* 62*   Calcium 8.9 9.1 9.8     Recent Labs   Lab 11/01/22  0640 10/31/22  0556 10/30/22  1411   WBC 7.75 7.33 10.47*   Hemoglobin 13.6 13.6 13.9   Hematocrit 41.1 41.2 41.6   Platelet Count 145 146 158     Recent Labs   Lab 10/31/22  0554   PT 12.9   INR 1.1   PTT 33     Recent Labs   Lab 10/31/22  0556 10/30/22  1411   Alkaline Phosphatase 106 122*   Bilirubin, Total 1.0 0.9   Bilirubin Direct 0.3  --    ALT 20 23   AST (SGOT) 18 22     MRI Brain WO Contrast    Result Date: 11/01/2022   1.No acute intracranial abnormality. Leota Jacobsen, MD 11/01/2022 8:02 AM    MR Angiogram Neck WO Contrast    Result Date: 10/30/2022   1.There is no stenosis of the proximal right internal carotid artery based on NASCET criteria. 2.There is no stenosis of the proximal left internal carotid artery based on NASCET criteria. 3.The cervical vertebral arteries are patent. 4.The intracranial arteries are patent, no proximal intracranial large vessel occlusion.  2 Alex [name], MD 10/30/2022 9:52 PM    MR Angiogram Head WO Contrast    Result Date: 10/30/2022   1.There is no stenosis of the proximal right internal carotid artery based on NASCET criteria. 2.There is no stenosis of the proximal left internal carotid artery based on NASCET criteria. 3.The cervical vertebral arteries are patent. 4.The intracranial arteries are patent, no proximal intracranial large vessel occlusion.  2 Alex [name], MD 10/30/2022 9:52 PM    MRI brain without contrast    Result Date: 10/30/2022   1.Negative MRI brain. No acute infarction. Alex [name], MD 10/30/2022 9:47 PM    CT Head WO Contrast    Result Date: 10/30/2022   No acute  intracranial abnormality. [name], MD 10/30/2022 4:04 PM    XR Chest PA Or AP    Result Date: 10/30/2022   Small foci of basilar atelectasis. [name], MD 10/30/2022 2:44 PM    XR Chest PA Lateral And Apical Lordotic    Result Date: 10/08/2022  IMPRESSION: No acute cardiopulmonary abnormality. Thank you for this referral to [facility] [place] [employer] in partnership with [facility].   For further consultation, please call [phone] or [phone] (phones answered 24/7/365).  Electronically Signed by Francina Ames, MD 10/08/2022 8:11 PM        This note was generated by the Epic EMR system/ Dragon speech recognition and may contain inherent errors or omissions not intended by the user. Grammatical errors, random word insertions, deletions and pronoun errors  are occasional consequences of this technology due to software limitations. Not all errors are caught or corrected. If there are questions or concerns about the content of this note or information contained within the body of this dictation they should be addressed directly with the author for clarification.  Signed by: Illene Regulus, MD

## 2022-11-01 NOTE — Plan of Care (Signed)
Problem: Pain interferes with ability to perform ADL  Goal: Pain at adequate level as identified by patient  Outcome: Not Progressing  Flowsheets (Taken 10/31/2022 2027 by Assunta Found Jua, [place], RN)  Pain at adequate level as identified by patient:   Identify patient comfort function goal   Assess for risk of opioid induced respiratory depression, including snoring/sleep apnea. Alert healthcare team of risk factors identified.   Assess pain on admission, during daily assessment and/or before any "as needed" intervention(s)   Reassess pain within 30-60 minutes of any procedure/intervention, per Pain Assessment, Intervention, Reassessment (AIR) Cycle   Evaluate if patient comfort function goal is met   Evaluate patient's satisfaction with pain management progress   Offer non-pharmacological pain management interventions  Note: Patient reports 10/10 pain     Problem: Pain  Goal: Pain at adequate level as identified by patient  Outcome: Not Progressing  Flowsheets (Taken 10/31/2022 2027 by Ivan Anchors Epse Jua, [place], RN)  Pain at adequate level as identified by patient:   Identify patient comfort function goal   Assess for risk of opioid induced respiratory depression, including snoring/sleep apnea. Alert healthcare team of risk factors identified.   Assess pain on admission, during daily assessment and/or before any "as needed" intervention(s)   Reassess pain within 30-60 minutes of any procedure/intervention, per Pain Assessment, Intervention, Reassessment (AIR) Cycle   Evaluate if patient comfort function goal is met   Evaluate patient's satisfaction with pain management progress   Offer non-pharmacological pain management interventions  Note: Patient reports 10/10 pain     Problem: Side Effects from Pain Analgesia  Goal: Patient will experience minimal side effects of analgesic therapy  Outcome: Progressing  Flowsheets (Taken 10/31/2022 2027 by Ivan Anchors Epse Jua, [place], RN)  Patient will experience minimal  side effects of analgesic therapy:   Monitor/assess patient's respiratory status (RR depth, effort, breath sounds)   Assess for changes in cognitive function   Evaluate for opioid-induced sedation with appropriate assessment tool (i.e. POSS)   Prevent/manage side effects per LIP orders (i.e. nausea, vomiting, pruritus, constipation, urinary retention, etc.)     Problem: Every Day - Stroke  Goal: Core/Quality measure requirements - Daily  Outcome: Progressing  Goal: Neurological status is stable or improving  Outcome: Progressing  Goal: Stable vital signs and fluid balance  Outcome: Progressing

## 2022-11-01 NOTE — Nursing Progress Note (Signed)
NURSE NOTE SUMMARY  [facility] INTERMEDIATE CARE   Patient Name: [name] Cook   Attending Physician: Illene Regulus, MD   Today's date:   11/01/2022 LOS: 0 days   Shift Summary:                                                              Patient rested in bed this shift and c/o chest pain and HA. Patient given PRN pain medication as ordered with mild effect. Patient denies SOB. Facial droop unchanged. No issues with swallowing noted. Patient changed room this evening d/t "not getting sleep". Patient reports she is extremely fatigued. Blood pressures elevated this shift even after scheduled antihypertensives. PRN given as ordered with good effect. See MAR. Call light and fluids within reach. Will continue to monitor.      Provider Notifications:        Rapid Response Notifications:  Mobility:      PMP Activity: Step 6 - Walks in Room (11/01/2022  8:50 AM)     Weight tracking:  Family Dynamic:   Last 3 Weights for the past 72 hrs (Last 3 readings):   Weight   10/30/22 2327 (!) 147.6 kg (325 lb 8 oz)   10/30/22 1327 (!) 148.3 kg (326 lb 15.1 oz)             Last Bowel Movement   No data recorded

## 2022-11-02 ENCOUNTER — Observation Stay: Payer: No Typology Code available for payment source

## 2022-11-02 DIAGNOSIS — R519 Headache, unspecified: Secondary | ICD-10-CM

## 2022-11-02 LAB — CBC
Absolute nRBC: 0 10*3/uL (ref <=0.00)
Hematocrit: 43 % (ref 34.7–43.7)
Hemoglobin: 14.1 g/dL (ref 11.4–14.8)
MCH: 31.5 pg (ref 25.1–33.5)
MCHC: 32.8 g/dL (ref 31.5–35.8)
MCV: 96 fL (ref 78.0–96.0)
MPV: 13.5 fL — ABNORMAL HIGH (ref 8.9–12.5)
Platelet Count: 173 10*3/uL (ref 142–346)
RBC: 4.48 10*6/uL (ref 3.90–5.10)
RDW: 13 % (ref 11–15)
WBC: 14.74 10*3/uL — ABNORMAL HIGH (ref 3.10–9.50)
nRBC %: 0 /100 WBC (ref <=0.0)

## 2022-11-02 LAB — RENAL FUNCTION PANEL
Albumin: 3.4 g/dL — ABNORMAL LOW (ref 3.5–5.0)
Anion Gap: 9 (ref 5.0–15.0)
BUN: 14 mg/dL (ref 7–21)
CO2: 24 mEq/L (ref 17–29)
Calcium: 9.5 mg/dL (ref 8.5–10.5)
Chloride: 108 mEq/L (ref 99–111)
Creatinine: 0.9 mg/dL (ref 0.4–1.0)
GFR: >60 mL/min/{1.73_m2} (ref >=60.0)
Glucose: 117 mg/dL — ABNORMAL HIGH (ref 70–100)
Phosphorus: 4.3 mg/dL (ref 2.3–4.7)
Potassium: 4.1 mEq/L (ref 3.5–5.3)
Sodium: 141 mEq/L (ref 135–145)

## 2022-11-02 LAB — WHOLE BLOOD GLUCOSE POCT
Whole Blood Glucose POCT: 128 mg/dL — ABNORMAL HIGH (ref 70–100)
Whole Blood Glucose POCT: 170 mg/dL — ABNORMAL HIGH (ref 70–100)
Whole Blood Glucose POCT: 171 mg/dL — ABNORMAL HIGH (ref 70–100)
Whole Blood Glucose POCT: 187 mg/dL — ABNORMAL HIGH (ref 70–100)

## 2022-11-02 LAB — MAGNESIUM: Magnesium: 2.4 mg/dL (ref 1.6–2.6)

## 2022-11-02 MED ORDER — METHOCARBAMOL 1000 MG/10ML IJ SOLN
500.0000 mg | Freq: Three times a day (TID) | INTRAVENOUS | Status: AC
Start: 2022-11-02 — End: 2022-11-03
  Administered 2022-11-03: 500 mg via INTRAVENOUS
  Filled 2022-11-02 (×3): qty 5

## 2022-11-02 MED ORDER — PREGABALIN 25 MG PO CAPS
25.0000 mg | ORAL_CAPSULE | Freq: Three times a day (TID) | ORAL | Status: DC
Start: 2022-11-02 — End: 2022-11-03
  Administered 2022-11-03: 25 mg via ORAL
  Filled 2022-11-02 (×2): qty 1

## 2022-11-02 MED ORDER — AMLODIPINE BESYLATE 5 MG PO TABS
10.0000 mg | ORAL_TABLET | Freq: Every day | ORAL | Status: DC
Start: 2022-11-03 — End: 2022-11-05
  Administered 2022-11-03 – 2022-11-04 (×2): 10 mg via ORAL
  Filled 2022-11-02 (×3): qty 2

## 2022-11-02 NOTE — Progress Notes (Signed)
MEDICINE PROGRESS NOTE  [facility], DIVISION OF HOSPITALIST MEDICINE   [facility]   [place] Pager: [phone]      Date Time: 11/02/22   Patient Name: [name] Cook  Attending Physician: Illene Regulus, MD  Today's date: 11/02/2022  Hospital Day: 4    CC: CVA (cerebrovascular accident due to intracerebral hemorrhage)    [name] Cook is 47 y.o. F with hx of hypertension,hyperlipidemia, prediabetes,CVA presenting with chest pain ,sob, and left side headache.She was found to have right side facial droop.Facial droop started while in ED. 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  # Hx of stroke  # Headache s/p Mg  - A1C 5.7, LDL 116  - ECG - NSR  - CTH negative  - MRI head, MRA head/neck - no acute abnormality  - started on valtrex and prednisone for  total seven days  - intractable to medical management  - started on Depakote,magnesium, benadryl and toradol  - c/w statin, holding ASA and heparin for LP  - optimize BP control  -c/w telemetry  - plan for LP tomorrow    # Essential hypertension  -  increase amlodipine 10 mg daily , c/w hydralazine 25 mg Q8H, and lisinopril 20 mg/d    # Hyperlipidemia  - c/w statin    # Seizure disorder  - c/w Keppra    # Atypical chest pain  - ECG with non specific T wave changes, NSR,HS trop neg x 2  - VQ with low Prob for PE, LE venous doppler - no DVT  - CXR small bibasilar atelectasis  - normal oxygen saturation  - improved    # Prediabetes   - monitor sugars  - reinforce diet modification        DVT prophylaxis :Heparin    Disposition : 8/21    Case discussed with:Pt, staff ,neurology      Subjective/ROS     Patient seen and examined.No new complaints, continues to have intractable headache, no neck pain or stiffness, CP improved, c/o LE pain. No SOB/dizziness/weakness  No urinary complaint  No abdominal pain/N/V  All other systems reviewed and are  negative.      Physical Exam:       Temp:  [97.3 F (36.3 C)-98.2 F (36.8 C)] 97.5 F (36.4 C)  Heart Rate:  [62-100] 71  Resp Rate:  [15-20] 16  BP: (124-182)/(66-106) 135/70  Intake and Output Summary-last 24 Hrs:  No intake/output data recorded.  No data recorded by O2 Device: None (Room air)    Vitals and nursing note reviewed. Exam conducted with a chaperone present.   Gen: Normal appearance, no distress   Mouth: Mucous membranes are moist.   Eyes: Normal conjunctivae, NIS  Cardiovascular: S1S2 RRR   Pulmonary: normal effort, CTAB  Abdomen: S/NT/ND/+BS  MSK: no LE edema  Neurological: Aox3, no gross abnormality ,+Right facial droop  Psych: normal behavior, normal mood       Meds:   Medications were reviewed:  Scheduled Meds:  Current Facility-Administered Medications   Medication Dose Route Frequency    amLODIPine  5 mg Oral Daily    aspirin  81 mg Oral Daily    Or    aspirin  300 mg Rectal Daily    atorvastatin  40 mg Oral QHS    famotidine  20 mg Oral Q12H [facility]  heparin (porcine)  5,000 Units Subcutaneous Q8H SCH    hydrALAZINE  25 mg Oral Q8H SCH    insulin lispro  1-3 Units Subcutaneous QHS    insulin lispro  1-5 Units Subcutaneous TID AC    levETIRAcetam  500 mg Oral Q12H SCH    lisinopril  20 mg Oral Daily    magnesium sulfate  1 g Intravenous BID    ondansetron  4 mg Intravenous Once    predniSONE  60 mg Oral Daily    valACYclovir HCL  1,000 mg Oral TID    valproate sodium  500 mg Intravenous Q8H     Continuous Infusions:      PRN Meds:.dextrose **OR** dextrose **OR** dextrose **OR** glucagon (rDNA), diphenhydrAMINE, diphenhydrAMINE, gadoterate meglumine, hydrALAZINE, HYDROmorphone, labetalol, LORazepam, ondansetron **OR** ondansetron  DVT prophylaxis Chemical and Mechanical        Lines:     Patient Lines/Drains/Airways Status       Active PICC Line / CVC Line / PIV Line / Drain / Airway / Intraosseous Line / Epidural Line / ART Line / Line / Wound / Pressure Ulcer / NG/OG Tube       Name Placement  date Placement time Site Days    Peripheral IV 10/31/22 20 G Anterior;Distal;Left Upper Arm 10/31/22  0046  Upper Arm  less than 1    Peripheral IV 10/31/22 20 G Anterior;Left Forearm 10/31/22  0047  Forearm  less than 1                       Labs/Radiology:   Imaging personally reviewed, including: all available   NM Lung Scan Perfusion w/Spect CT    Result Date: 11/01/2022   Normal perfusion lung scan. Collene Schlichter, MD 11/01/2022 3:46 PM    MRI Brain WO Contrast    Result Date: 11/01/2022   1.No acute intracranial abnormality. Leota Jacobsen, MD 11/01/2022 8:02 AM    MR Angiogram Neck WO Contrast    Result Date: 10/30/2022   1.There is no stenosis of the proximal right internal carotid artery based on NASCET criteria. 2.There is no stenosis of the proximal left internal carotid artery based on NASCET criteria. 3.The cervical vertebral arteries are patent. 4.The intracranial arteries are patent, no proximal intracranial large vessel occlusion.  2 Alex [name], MD 10/30/2022 9:52 PM    MR Angiogram Head WO Contrast    Result Date: 10/30/2022   1.There is no stenosis of the proximal right internal carotid artery based on NASCET criteria. 2.There is no stenosis of the proximal left internal carotid artery based on NASCET criteria. 3.The cervical vertebral arteries are patent. 4.The intracranial arteries are patent, no proximal intracranial large vessel occlusion.  2 Alex [name], MD 10/30/2022 9:52 PM    MRI brain without contrast    Result Date: 10/30/2022   1.Negative MRI brain. No acute infarction. Alex [name], MD 10/30/2022 9:47 PM    CT Head WO Contrast    Result Date: 10/30/2022   No acute intracranial abnormality. [name], MD 10/30/2022 4:04 PM    XR Chest PA Or AP    Result Date: 10/30/2022   Small foci of basilar atelectasis. [name], MD 10/30/2022 2:44 PM    XR Chest PA Lateral And Apical Lordotic    Result Date: 10/08/2022  IMPRESSION: No acute cardiopulmonary  abnormality. Thank you for this referral to [facility] [place], [employer] in partnership with [facility].   For further consultation, please call [phone] or [phone] (phones answered 24/7/365).  Electronically Signed by Francina Ames, MD 10/08/2022 8:11 PM   Recent Labs     11/02/22  0850 11/01/22  2116 11/01/22  1754 11/01/22  1256   Whole Blood Glucose POCT 128* 151* 165* 128*     Recent Labs   Lab 11/02/22  8469 11/01/22  0540 10/31/22  0556 10/30/22  1411   Sodium 141 141 143 145   Potassium 4.1 4.0 3.6 3.3*   Chloride 108 110 108 110   BUN 14 10 9 12    Creatinine 0.9 0.9 0.8 0.9   GFR >60.0 >60.0 >60.0 >60.0   Glucose 117* 121* 141* 62*   Calcium 9.5 8.9 9.1 9.8     Recent Labs   Lab 11/02/22  0633 11/01/22  0640 10/31/22  0556 10/30/22  1411   WBC 14.74* 7.75 7.33 10.47*   Hemoglobin 14.1 13.6 13.6 13.9   Hematocrit 43.0 41.1 41.2 41.6   Platelet Count 173 145 146 158     Recent Labs   Lab 10/31/22  0554   PT 12.9   INR 1.1   PTT 33     Recent Labs   Lab 10/31/22  0556 10/30/22  1411   Alkaline Phosphatase 106 122*   Bilirubin, Total 1.0 0.9   Bilirubin Direct 0.3  --    ALT 20 23   AST (SGOT) 18 22     NM Lung Scan Perfusion w/Spect CT    Result Date: 11/01/2022   Normal perfusion lung scan. Collene Schlichter, MD 11/01/2022 3:46 PM    MRI Brain WO Contrast    Result Date: 11/01/2022   1.No acute intracranial abnormality. Leota Jacobsen, MD 11/01/2022 8:02 AM    MR Angiogram Neck WO Contrast    Result Date: 10/30/2022   1.There is no stenosis of the proximal right internal carotid artery based on NASCET criteria. 2.There is no stenosis of the proximal left internal carotid artery based on NASCET criteria. 3.The cervical vertebral arteries are patent. 4.The intracranial arteries are patent, no proximal intracranial large vessel occlusion.  2 Alex [name], MD 10/30/2022 9:52 PM    MR Angiogram Head WO Contrast    Result Date: 10/30/2022   1.There is no stenosis of the  proximal right internal carotid artery based on NASCET criteria. 2.There is no stenosis of the proximal left internal carotid artery based on NASCET criteria. 3.The cervical vertebral arteries are patent. 4.The intracranial arteries are patent, no proximal intracranial large vessel occlusion.  2 Alex [name], MD 10/30/2022 9:52 PM    MRI brain without contrast    Result Date: 10/30/2022   1.Negative MRI brain. No acute infarction. Alex [name], MD 10/30/2022 9:47 PM    CT Head WO Contrast    Result Date: 10/30/2022   No acute intracranial abnormality. [name], MD 10/30/2022 4:04 PM    XR Chest PA Or AP    Result Date: 10/30/2022   Small foci of basilar atelectasis. [name], MD 10/30/2022 2:44 PM    XR Chest PA Lateral And Apical Lordotic    Result Date: 10/08/2022  IMPRESSION: No acute cardiopulmonary abnormality. Thank you for this referral to [facility] [place], [employer] in partnership with [facility].   For further consultation, please call [phone] or [phone] (phones answered 24/7/365).  Electronically Signed by Francina Ames, MD 10/08/2022 8:11 PM  This note was generated by the Epic EMR system/ Dragon speech recognition and may contain inherent errors or omissions not intended by the user. Grammatical errors, random word insertions, deletions and pronoun errors  are occasional consequences of this technology due to software limitations. Not all errors are caught or corrected. If there are questions or concerns about the content of this note or information contained within the body of this dictation they should be addressed directly with the author for clarification.  Signed by: Illene Regulus, MD

## 2022-11-02 NOTE — Plan of Care (Signed)
Patient refused Lyrical. Education offered. Patient stated "too many side effects". Notified provider. Patient also refused Depacon IV,

## 2022-11-02 NOTE — Progress Notes (Signed)
CM Update:     [place] Plan- return home, no needs.     [place] transportation: no limitation.    Anticipated [place] per today's MDR- Per Dr. Verne Grain, likely today.      Berline Chough, BSN, RN  RN Case Manager   [phone]  (F): [phone]

## 2022-11-02 NOTE — Progress Notes (Addendum)
Date Time: 11/02/22 1:16 PM  Patient Name: [name] Cook  Attending Physician: Illene Regulus, MD  Patient Class: Observation  Hospital Day: 0            NEUROLOGY PROGRESS NOTE             Assessment/Plan   Possible left-sided partial Bell's palsy second brain MRI negative this was done without contrast because patient is allergic  Still ongoing left-sided headache of note patient with no prior history of headaches  Elevated blood pressure readings,?  Hypertension related head pain    MRI brain MRA head and neck are negative      Remain on valacyclovir and prednisone  Will retry magnesium Benadryl Toradol  Lyrica   if headache does not get better, may need to consider getting a spinal tap especially given she does not really have a prior history of headaches                   Subjective:   Patient Seen and Examined.  No change in facial asymmetry headache remains, left temporoparietal, vertex region, throbbing, at 6/10 intensity    Review of Systems:   No eye, ear nose, throat problems; no coughing or wheezing or shortness of breath,   No chest pain or orthopnea, no abdominal pain, nausea or vomiting,   No pain in the body or extremities, no psychiatric, neurological, endocrine, hematological   or cardiac complaints except as noted above.          Physical Exam:   BP 135/70   Pulse 71   Temp 97.5 F (36.4 C) (Oral)   Resp 16   Ht 1.778 m (5\' 10" )   Wt (!) 147.6 kg (325 lb 8 oz)   LMP 10/15/2018 (Exact Date)   SpO2 95%   BMI 46.70 kg/m     Neuro:  Level of consciousness: Awake alert oriented x 3 looks somewhat uncomfortable left facial asymmetry remains unchanged moving both upper lower extremities well  Meds:      Scheduled Meds: PRN Meds:    amLODIPine, 5 mg, Oral, Daily  aspirin, 81 mg, Oral, Daily   Or  aspirin, 300 mg, Rectal, Daily  atorvastatin, 40 mg, Oral, QHS  famotidine, 20 mg, Oral, Q12H SCH  heparin (porcine), 5,000 Units, Subcutaneous, Q8H SCH  hydrALAZINE, 25 mg, Oral, Q8H SCH  insulin  lispro, 1-3 Units, Subcutaneous, QHS  insulin lispro, 1-5 Units, Subcutaneous, TID AC  levETIRAcetam, 500 mg, Oral, Q12H SCH  lisinopril, 20 mg, Oral, Daily  ondansetron, 4 mg, Intravenous, Once  predniSONE, 60 mg, Oral, Daily  valACYclovir HCL, 1,000 mg, Oral, TID  valproate sodium, 500 mg, Intravenous, Q8H        Continuous Infusions:   dextrose, 15 g of glucose, PRN   Or  dextrose, 12.5 g, PRN   Or  dextrose, 12.5 g, PRN   Or  glucagon (rDNA), 1 mg, PRN  diphenhydrAMINE, 25 mg, Q6H PRN  diphenhydrAMINE, 6.25 mg, Q6H PRN  gadoterate meglumine, 20 mL, ONCE PRN  hydrALAZINE, 10 mg, Q3H PRN  HYDROmorphone, 1 mg, Q4H PRN  labetalol, 10 mg, Q15 Min PRN  LORazepam, 1 mg, Q4H PRN  ondansetron, 4 mg, Q6H PRN   Or  ondansetron, 4 mg, Q6H PRN                Labs:     Recent Labs   Lab 11/02/22  0633 11/01/22  0540 10/31/22  0556 10/30/22  1411   Glucose  117* 121* 141* 62*   BUN 14 10 9 12    Creatinine 0.9 0.9 0.8 0.9   Calcium 9.5 8.9 9.1 9.8   Sodium 141 141 143 145   Potassium 4.1 4.0 3.6 3.3*   Chloride 108 110 108 110   CO2 24 22 28 29    Albumin 3.4* 3.6 3.6  3.6 4.0   Phosphorus 4.3 3.6 3.6 2.6   Magnesium 2.4 2.3 2.2 2.2   AST (SGOT)  --   --  18 22   ALT  --   --  20 23   Bilirubin, Total  --   --  1.0 0.9   Alkaline Phosphatase  --   --  106 122*     Recent Labs   Lab 11/02/22  0633 11/01/22  0640 10/31/22  0556   WBC 14.74* 7.75 7.33   Hemoglobin 14.1 13.6 13.6   Hematocrit 43.0 41.1 41.2   MCV 96.0 96.5* 95.6   MCH 31.5 31.9 31.6   MCHC 32.8 33.1 33.0   Platelet Count 173 145 146                 Lab Results   Component Value Date    TSH 3.62 10/31/2022               .ll    Recent Labs     10/31/22  0554   PTT 33   PT 12.9   INR 1.1          Radiology Results (24 Hour)       Procedure Component Value Units Date/Time    US Venous Low Extrem Duplx Dopp Comp Bilat [[id number]] Collected: 11/02/22 1257    Order Status: Completed Updated: 11/02/22 1300    Narrative:      HISTORY: Bilateral leg edema    COMPARISON:  07/29/2021    TECHNIQUE: Wallace Cullens scale, color flow, and spectral Doppler waveform analysis  was performed on the bilateral lower extremity veins described below. There  is normal compressibility, phasic flow, and response to augmentation unless  otherwise noted.    FINDINGS:   Right lower extremity:    Common femoral vein: Normal  Greater saphenous vein at the saphenofemoral junction: Normal  Deep femoral vein (proximal portion): Normal  Femoral vein: Normal  Popliteal vein: Normal  Posterior tibial veins: Normal  Peroneal veins: Normal    Left lower extremity:    Common femoral vein: Normal  Greater saphenous vein at the saphenofemoral junction: Normal  Deep femoral vein (proximal portion): Normal  Femoral vein: Normal  Popliteal vein: Normal  Posterior tibial veins: Normal  Peroneal veins: Normal      Impression:         No sonographic evidence for right or left lower extremity deep venous  thrombosis.    Mills Koller, MD  11/02/2022 12:58 PM    NM Lung Scan Perfusion w/Spect CT [[id number]] Collected: 11/01/22 1544    Order Status: Completed Updated: 11/01/22 1549    Narrative:      HISTORY: Chest pain, high pretest probability for pulmonary embolism.    CORRELATION: Chest X-ray(s) 10/30/2022.    TECHNIQUE: 6.5 mCi Tc 43m MAA (macroaggregated albumin) was injected  intravenously and multiple planar images of the lungs were obtained in  different projections. In addition, SPECT and low dose CT imaging of the  thorax was obtained. SPECT and CT images were fused and reviewed in the  axial, sagittal and coronal planes.  FINDINGS: There is normal tracer distribution throughout both lungs and no  focal segmental or subsegmental perfusion defect to suggest pulmonary  embolism.       Impression:       Normal perfusion lung scan.    Collene Schlichter, MD  11/01/2022 3:46 PM             All brain imaging (MRI, CT) personally reviewed.    Case discussed with: pt and rn               I personally reviewed all of the  medications.  Medication list generated using all available resources.  Elder abuse (physical)  - negative  Advanced care plan - reviewed from chart or in discussion with pt or family        This note was generated by the Epic EMR system/ Dragon speech recognition and may contain inherent errors or omissions not intended by the user. Grammatical errors, random word insertions, deletions and pronoun errors  are occasional consequences of this technology due to software limitations.   Not all errors are caught or corrected. If there are questions or concerns about the content of this note or information contained within the body of this dictation they should be addressed directly with the author for clarification.      Signed by: Cathe Mons, MD, MD  Spectralink: [id number]      Answering Service: [phone]

## 2022-11-02 NOTE — Plan of Care (Signed)
Problem: Pain interferes with ability to perform ADL  Goal: Pain at adequate level as identified by patient  Outcome: Progressing  Flowsheets (Taken 11/02/2022 1635)  Pain at adequate level as identified by patient:   Evaluate if patient comfort function goal is met   Consult/collaborate with Physical Therapy, Occupational Therapy, and/or Speech Therapy   Offer non-pharmacological pain management interventions   Identify patient comfort function goal   Evaluate patient's satisfaction with pain management progress     Problem: Side Effects from Pain Analgesia  Goal: Patient will experience minimal side effects of analgesic therapy  Outcome: Progressing     Problem: Day of Admission - Stroke  Goal: Core/Quality measure requirements - Admission  Outcome: Progressing     Problem: Every Day - Stroke  Goal: Core/Quality measure requirements - Daily  Outcome: Progressing  Goal: Neurological status is stable or improving  Outcome: Progressing  Goal: Stable vital signs and fluid balance  Outcome: Progressing  Goal: Patient will maintain adequate oxygenation  Outcome: Progressing  Goal: Patient's risk of aspiration will be minimized  Outcome: Progressing  Goal: Nutritional intake is adequate  Outcome: Progressing  Goal: Elimination patterns are normal or improving  Outcome: Progressing  Goal: Mobility/Activity is maintained at optimal level for patient  Outcome: Progressing  Goal: Skin integrity is maintained or improved  Outcome: Progressing  Goal: Neurovascular status is stable or improving  Outcome: Progressing  Goal: Effective coping demonstrated  Outcome: Progressing  Goal: Will be able to express needs and understand communication  Outcome: Progressing     Problem: Day of Discharge - Stroke  Goal: Able to express understanding of discharge instructions and stroke education  Outcome: Progressing  Goal: Core/Quality measures - Discharge  Outcome: Progressing     Problem: Safety  Goal: Patient will be free from injury  during hospitalization  Outcome: Progressing  Flowsheets (Taken 11/02/2022 1635)  Patient will be free from injury during hospitalization:   Provide and maintain safe environment   [place] rounding   Include patient/ family/ care giver in decisions related to safety  Goal: Patient will be free from infection during hospitalization  Outcome: Progressing  Flowsheets (Taken 11/02/2022 1635)  Free from Infection during hospitalization:   Assess and monitor for signs and symptoms of infection   Monitor lab/diagnostic results   Encourage patient and family to use good hand hygiene technique     Problem: Pain  Goal: Pain at adequate level as identified by patient  Outcome: Progressing  Flowsheets (Taken 11/02/2022 1635)  Pain at adequate level as identified by patient:   Evaluate if patient comfort function goal is met   Consult/collaborate with Physical Therapy, Occupational Therapy, and/or Speech Therapy   Offer non-pharmacological pain management interventions   Identify patient comfort function goal   Evaluate patient's satisfaction with pain management progress     Problem: Discharge Barriers  Goal: Patient will be discharged home or other facility with appropriate resources  Outcome: Progressing     Problem: Compromised Activity/Mobility  Goal: Activity/Mobility Interventions  Outcome: Progressing

## 2022-11-02 NOTE — Nursing Progress Note (Signed)
Pt is refusing Robaxin because she stated "I don't want to try anything new right now". PA notified. Pt also refusing Depacon and Lyrica

## 2022-11-03 ENCOUNTER — Observation Stay: Payer: No Typology Code available for payment source

## 2022-11-03 LAB — LAB USE ONLY - CBC WITH DIFFERENTIAL
Absolute Basophils: 0.04 10*3/uL (ref 0.00–0.08)
Absolute Eosinophils: 0.09 10*3/uL (ref 0.00–0.44)
Absolute Immature Granulocytes: 0.07 10*3/uL (ref 0.00–0.07)
Absolute Lymphocytes: 4.16 10*3/uL — ABNORMAL HIGH (ref 0.42–3.22)
Absolute Monocytes: 1.15 10*3/uL — ABNORMAL HIGH (ref 0.21–0.85)
Absolute Neutrophils: 10.99 10*3/uL — ABNORMAL HIGH (ref 1.10–6.33)
Absolute nRBC: 0 10*3/uL (ref <=0.00)
Basophils %: 0.2 %
Eosinophils %: 0.5 %
Hematocrit: 45.8 % — ABNORMAL HIGH (ref 34.7–43.7)
Hemoglobin: 14.6 g/dL (ref 11.4–14.8)
Immature Granulocytes %: 0.4 %
Lymphocytes %: 25.2 %
MCH: 31.3 pg (ref 25.1–33.5)
MCHC: 31.9 g/dL (ref 31.5–35.8)
MCV: 98.3 fL — ABNORMAL HIGH (ref 78.0–96.0)
MPV: 13.6 fL — ABNORMAL HIGH (ref 8.9–12.5)
Monocytes %: 7 %
Neutrophils %: 66.7 %
Platelet Count: 170 10*3/uL (ref 142–346)
Preliminary Absolute Neutrophil Count: 10.99 10*3/uL — ABNORMAL HIGH (ref 1.10–6.33)
RBC: 4.66 10*6/uL (ref 3.90–5.10)
RDW: 13 % (ref 11–15)
WBC: 16.5 10*3/uL — ABNORMAL HIGH (ref 3.10–9.50)
nRBC %: 0 /100 WBC (ref <=0.0)

## 2022-11-03 LAB — LAB USE ONLY - CSF CELL COUNT TUBE #4
CSF Lymphocytes Tube #4: 80 % (ref 40–80)
CSF Macrophages Tube #4: 20 % (ref 15–45)
CSF Neutrophils Tube #4: 0 % (ref 0–6)
CSF RBC Count Tube #4: 1 /mm3 (ref 0–3)
CSF WBC Count Tube #4: 0 /mm3 (ref 0–5)

## 2022-11-03 LAB — RENAL FUNCTION PANEL
Albumin: 3.6 g/dL (ref 3.5–5.0)
Anion Gap: 8 (ref 5.0–15.0)
BUN: 16 mg/dL (ref 7–21)
CO2: 21 mEq/L (ref 17–29)
Calcium: 9.5 mg/dL (ref 8.5–10.5)
Chloride: 109 mEq/L (ref 99–111)
Creatinine: 0.9 mg/dL (ref 0.4–1.0)
GFR: >60 mL/min/{1.73_m2} (ref >=60.0)
Glucose: 145 mg/dL — ABNORMAL HIGH (ref 70–100)
Phosphorus: 3.4 mg/dL (ref 2.3–4.7)
Potassium: 4.3 mEq/L (ref 3.5–5.3)
Sodium: 138 mEq/L (ref 135–145)

## 2022-11-03 LAB — LAB USE ONLY - CSF CELL COUNT TUBE #1
CSF Lymphocytes Tube #1: 55 % (ref 40–80)
CSF Macrophages Tube #1: 41 % (ref 15–45)
CSF Neutrophils Tube #1: 3 % (ref 0–6)
CSF RBC Count Tube #1: 1 /mm3 (ref 0–3)
CSF WBC Count Tube #1: 1 /mm3 (ref 0–5)

## 2022-11-03 LAB — CSF PROTEIN: CSF Protein: 26 mg/dL (ref 15.0–40.0)

## 2022-11-03 LAB — PT AND APTT
INR: 0.9 (ref 0.9–1.1)
PT: 11 s (ref 10.1–12.9)
PTT: 32 s (ref 27–39)

## 2022-11-03 LAB — CSF GLUCOSE: CSF Glucose: 103 mg/dL — ABNORMAL HIGH (ref 40–70)

## 2022-11-03 LAB — WHOLE BLOOD GLUCOSE POCT
Whole Blood Glucose POCT: 135 mg/dL — ABNORMAL HIGH (ref 70–100)
Whole Blood Glucose POCT: 152 mg/dL — ABNORMAL HIGH (ref 70–100)
Whole Blood Glucose POCT: 181 mg/dL — ABNORMAL HIGH (ref 70–100)
Whole Blood Glucose POCT: 170 mg/dL — ABNORMAL HIGH (ref 70–100)

## 2022-11-03 MED ORDER — PREGABALIN 25 MG PO CAPS
50.0000 mg | ORAL_CAPSULE | Freq: Three times a day (TID) | ORAL | Status: DC
Start: 2022-11-03 — End: 2022-11-05
  Administered 2022-11-04 (×2): 50 mg via ORAL
  Filled 2022-11-03 (×4): qty 2

## 2022-11-03 MED ORDER — HYDRALAZINE HCL 50 MG PO TABS
50.0000 mg | ORAL_TABLET | Freq: Three times a day (TID) | ORAL | Status: DC
Start: 2022-11-03 — End: 2022-11-05
  Administered 2022-11-03 – 2022-11-05 (×5): 50 mg via ORAL
  Filled 2022-11-03 (×5): qty 1

## 2022-11-03 MED ORDER — HYDROMORPHONE HCL 1 MG/ML IJ SOLN
2.0000 mg | Freq: Once | INTRAMUSCULAR | Status: AC
Start: 2022-11-03 — End: 2022-11-03
  Administered 2022-11-03: 2 mg via INTRAVENOUS
  Filled 2022-11-03: qty 2

## 2022-11-03 MED ORDER — HYDROMORPHONE HCL 1 MG/ML IJ SOLN
2.0000 mg | INTRAMUSCULAR | Status: DC | PRN
Start: 2022-11-03 — End: 2022-11-04
  Administered 2022-11-03 – 2022-11-04 (×6): 2 mg via INTRAVENOUS
  Filled 2022-11-03 (×5): qty 2
  Filled 2022-11-03: qty 1
  Filled 2022-11-03: qty 2

## 2022-11-03 NOTE — Progress Notes (Signed)
Date Time: 11/03/22 4:26 PM  Patient Name: [name] Cook  Attending Physician: Illene Regulus, MD  Patient Class: Inpatient  Hospital Day: 0            NEUROLOGY PROGRESS NOTE             Assessment/Plan   Possible left-sided partial Bell's palsy second brain MRI negative this was done without contrast because patient is allergic  Still ongoing left-sided headache of note patient with no prior history of headaches  Elevated blood pressure readings,?  Hypertension related head pain    MRI brain MRA head and neck are negative      Remain on valacyclovir and prednisone  Waiting on lumbar puncture, patient refused to take Depakote, will increase Lyrica to 50 3 times daily  Subjective:   Patient Seen and Examined.  August 23 continues to complain of headache facial droop      No change in facial asymmetry headache remains, left temporoparietal, vertex region, throbbing, at 6/10 intensity    Review of Systems:   No eye, ear nose, throat problems; no coughing or wheezing or shortness of breath,   No chest pain or orthopnea, no abdominal pain, nausea or vomiting,   No pain in the body or extremities, no psychiatric, neurological, endocrine, hematological   or cardiac complaints except as noted above.          Physical Exam:   BP (!) 156/96   Pulse 100   Temp 98.4 F (36.9 C) (Oral)   Resp 18   Ht 1.778 m (5\' 10" )   Wt (!) 147.6 kg (325 lb 8 oz)   LMP 10/15/2018 (Exact Date)   SpO2 (!) 83%   BMI 46.70 kg/m     Neuro:  Level of consciousness: Awake alert oriented x 3 looks somewhat uncomfortable left facial asymmetry remains unchanged moving both upper lower extremities well  Meds:      Scheduled Meds: PRN Meds:    amLODIPine, 10 mg, Oral, Daily  [Held by provider] aspirin, 81 mg, Oral, Daily   Or  [Held by provider] aspirin, 300 mg, Rectal, Daily  atorvastatin, 40 mg, Oral, QHS  famotidine, 20 mg, Oral, Q12H SCH  [Held by provider] heparin (porcine), 5,000 Units, Subcutaneous, Q8H SCH  hydrALAZINE, 25 mg, Oral,  Q8H SCH  insulin lispro, 1-3 Units, Subcutaneous, QHS  insulin lispro, 1-5 Units, Subcutaneous, TID AC  levETIRAcetam, 500 mg, Oral, Q12H SCH  lisinopril, 20 mg, Oral, Daily  methocarbamol (ROBAXIN) 500 mg in dextrose 5 % 250 mL IVPB, 500 mg, Intravenous, Q8H SCH  ondansetron, 4 mg, Intravenous, Once  predniSONE, 60 mg, Oral, Daily  pregabalin, 25 mg, Oral, TID  valACYclovir HCL, 1,000 mg, Oral, TID  valproate sodium, 500 mg, Intravenous, Q8H        Continuous Infusions:   dextrose, 15 g of glucose, PRN   Or  dextrose, 12.5 g, PRN   Or  dextrose, 12.5 g, PRN   Or  glucagon (rDNA), 1 mg, PRN  diphenhydrAMINE, 25 mg, Q6H PRN  diphenhydrAMINE, 6.25 mg, Q6H PRN  gadoterate meglumine, 20 mL, ONCE PRN  hydrALAZINE, 10 mg, Q3H PRN  HYDROmorphone, 1 mg, Q4H PRN  labetalol, 10 mg, Q15 Min PRN  LORazepam, 1 mg, Q4H PRN  ondansetron, 4 mg, Q6H PRN   Or  ondansetron, 4 mg, Q6H PRN                Labs:     Recent Labs   Lab 11/03/22  4132 11/02/22  4401 11/01/22  0540 10/31/22  0556 10/30/22  1411   Glucose 145* 117* 121* 141* 62*   BUN 16 14 10 9 12    Creatinine 0.9 0.9 0.9 0.8 0.9   Calcium 9.5 9.5 8.9 9.1 9.8   Sodium 138 141 141 143 145   Potassium 4.3 4.1 4.0 3.6 3.3*   Chloride 109 108 110 108 110   CO2 21 24 22 28 29    Albumin 3.6 3.4* 3.6 3.6  3.6 4.0   Phosphorus 3.4 4.3 3.6 3.6 2.6   Magnesium  --  2.4 2.3 2.2 2.2   AST (SGOT)  --   --   --  18 22   ALT  --   --   --  20 23   Bilirubin, Total  --   --   --  1.0 0.9   Alkaline Phosphatase  --   --   --  106 122*     Recent Labs   Lab 11/03/22  1002 11/02/22  0633 11/01/22  0640   WBC 16.50* 14.74* 7.75   Hemoglobin 14.6 14.1 13.6   Hematocrit 45.8* 43.0 41.1   MCV 98.3* 96.0 96.5*   MCH 31.3 31.5 31.9   MCHC 31.9 32.8 33.1   Platelet Count 170 173 145                 Lab Results   Component Value Date    TSH 3.62 10/31/2022               .ll    Recent Labs     11/03/22  1126   PTT 32   PT 11.0   INR 0.9          Radiology Results (24 Hour)       Procedure Component  Value Units Date/Time    [facility] Guided Lumbar Puncture Diagnostic [[id number]] Resulted: 11/03/22 1513    Order Status: Sent Updated: 11/03/22 1513             All brain imaging (MRI, CT) personally reviewed.    Case discussed with: pt and rn               I personally reviewed all of the medications.  Medication list generated using all available resources.  Elder abuse (physical)  - negative  Advanced care plan - reviewed from chart or in discussion with pt or family        This note was generated by the Epic EMR system/ Dragon speech recognition and may contain inherent errors or omissions not intended by the user. Grammatical errors, random word insertions, deletions and pronoun errors  are occasional consequences of this technology due to software limitations.   Not all errors are caught or corrected. If there are questions or concerns about the content of this note or information contained within the body of this dictation they should be addressed directly with the author for clarification.      Signed by: Cathe Mons, MD, MD  Spectralink: [id number]      Answering Service: [phone]

## 2022-11-03 NOTE — UM Notes (Signed)
[name] Cook   DOB Mar 22, 1975    Admit to Inpatient [[id number]]    Electronically signed by: Illene Regulus, MD on 11/03/22 1139 Status: Active   Ordering user: Illene Regulus, MD 11/03/22 1139 Ordering provider: Illene Regulus, MD   Authorized by: Illene Regulus, MD    Add Signature Requirement   Frequency: Once 11/03/22 1138 - 1  occurrence       Headaches Clinical Indications for Admission to Inpatient Care  Overall Determination: Indications Met  Criteria:  []  Admission is indicated for  1 or more  of the following :      []  Severe headache that persists despite observation care        47 y.o. female presents for evaluation of chest pain associated with shortness of breath that she describes as substernal nonradiating and is going on for a few days currently resolved.      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 On my exam she also has right sided weakness that again patient is uncertain when it began but according to the ed provider not present when he examined her 4-5 hours ago      NIHSS Score 1           8/19 Medicine plan:  - Admit to [facility] service, observation status  - TIA/CVA order set/pathway  - Neuro checks q4hrs  - MRI Brain   - MRA Brain and Neck  - 2D-Echo  - Tele monitor  - Hold bp meds to allow for permissive htn  - prn iv meds with parameters for blood pressure  - Neurology consulted by me  - PT/OT/SLP evals  - NPO till patient passes bedside swallow eval  - ASA and High Intensity Statin therapy  - Check Hgaic and fasting lipids  - dvt prophy with sq heparin   - check fs with ssi coverage  - cardiology consulted by ed provider  - monitor further chest pain now resolved on tele and trend troponin  - replace potassium and check mag, phos levels  - c/w keppra for sz d/o     Patient has BMI=Body mass index is 46.91 kg/m.  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    Anticipated medical stability for discharge: 24 Hrs        8/21 Neurology note: No change in facial asymmetry headache remains, left temporoparietal, vertex region, throbbing, at 6/10 intensity     Plan:  Possible left-sided partial Bell's palsy second brain MRI negative this was done without contrast because patient is allergic  Still ongoing left-sided headache of note patient with no prior history of headaches  Elevated blood pressure readings,?  Hypertension related head pain     MRI brain MRA head and neck are negative     -Remain on valacyclovir and prednisone  -Will retry magnesium Benadryl Toradol  -Will try Depakote  -If headache does not get better,  may need to consider getting a spinal tap especially given she does not really have a prior history of headaches      8/22 Medicine note: Continues to have intractable headache, no neck pain or stiffness, CP improved, c/o LE pain     Plan:  # Intractable headache  # Right Facial droop likely d/t bell's palsy  # Hx of stroke  # Headache s/p Mg  - A1C 5.7, LDL 116  - ECG - NSR  - CTH negative  - MRI head, MRA head/neck - no acute abnormality  - started on valtrex and prednisone for  total seven days  - intractable to medical management  - started on Depakote,magnesium, benadryl and toradol  - c/w statin, holding ASA and heparin for LP  - optimize BP control  -c/w telemetry  - plan for LP tomorrow     # Essential hypertension  -  increase amlodipine 10 mg daily , c/w hydralazine 25 mg Q8H, and lisinopril 20 mg/d     # Hyperlipidemia  - c/w  statin     # Seizure disorder  - c/w Keppra     # Atypical chest pain  - ECG with non specific T wave changes, NSR,HS trop neg x 2  - VQ with low Prob for PE, LE venous doppler - no DVT  - CXR small bibasilar atelectasis  - normal oxygen saturation  - improved     # Prediabetes   - monitor sugars  - reinforce diet modification                Scheduled    Medication Ordered Dose/Rate, Route, Frequency Last Action   amLODIPine (NORVASC) tablet 10 mg    10 mg, PO, Daily Given, 10 mg at 08/23 6045   [Held by provider] aspirin chewable tablet 81 mg (Or Linked Group #1)   On hold since yesterday at 1933 until today at 1932   Hold reason: Pre/Post Procedure - only reason to hold    81 mg, PO, Daily Given, 81 mg at 08/22 0859   [Held by provider] aspirin suppository 300 mg (Or Linked Group #1)   On hold since yesterday at 1933 until today at 1932   Hold reason: Pre/Post Procedure - only reason to hold    300 mg, RE, Daily See Alternative, 08/22 0859   atorvastatin (LIPITOR) tablet 40 mg    40 mg, PO, QHS Given, 40 mg at 08/22 2140   famotidine (PEPCID) tablet 20 mg    20 mg, PO, Q12H [facility] Given, 20 mg at 08/23 4098   [Held by provider] heparin (porcine) injection 5,000 Units   On hold since yesterday at 1933 until today at 1933   Hold reason: Pre/Post Procedure - only reason to hold    5,000 Units, SC, Q8H [facility] Given, 5,000 Units at 08/22 1403   hydrALAZINE (APRESOLINE) tablet 25 mg    25 mg, PO, Q8H SCH Given, 25 mg at 08/23 1326   HYDROmorphone (DILAUDID) injection 2 mg    2 mg, IV, Once Ordered   insulin lispro injection 1-3 Units (And Linked Group #2)    1-3 Units, SC, QHS Given, 1 Units at 08/22 2140   insulin lispro injection 1-5 Units (And Linked Group #3)    1-5 Units, SC, TID AC Given, 2 Units at 08/21 0901   levETIRAcetam (KEPPRA) tablet 500 mg    500 mg, PO, Q12H SCH Given, 500 mg at 08/23 0826   lisinopril (ZESTRIL)  tablet 20 mg    20 mg, PO, Daily Given, 20 mg at 08/23 9381   methocarbamol (ROBAXIN) 500 mg in  dextrose 5 % 250 mL IVPB    500 mg, IV, Q8H SCH Ordered   ondansetron (ZOFRAN) injection 4 mg    4 mg, IV, Once Ordered   predniSONE (DELTASONE) tablet 60 mg    60 mg, PO, Daily Given, 60 mg at 08/23 0826   pregabalin (LYRICA) capsule 25 mg    25 mg, PO, TID Given, 25 mg at 08/23 1326   valACYclovir HCL (VALTREX) tablet 1,000 mg    1,000 mg, PO, TID Given, 1,000 mg at 08/23 0518   valproate (DEPACON) 500 mg in sodium chloride 0.9 % 100 mL IVPB    500 mg, IV, Q8H Ordered            11/02/22 06:33 11/02/22 08:50 11/02/22 13:54 11/02/22 16:28 11/02/22 21:08 11/03/22 05:50 11/03/22 08:04 11/03/22 10:02   WBC 14.74 (H)       16.50 (H)   Hematocrit 43.0       45.8 (H)   MCV 96.0       98.3 (H)   MPV 13.5 (H)       13.6 (H)   Instrument Absolute Neutrophil Count        10.99 (H)   Lymphocytes Absolute Automated        4.16 (H)   Monocytes Absolute Automated        1.15 (H)   Neutrophils Absolute        10.99 (H)   Glucose 117 (H)     145 (H)     Whole Blood Glucose POCT  128 (H) 170 (H) 171 (H) 187 (H)  135 (H)    Potassium 4.1     4.3     Albumin 3.4 (L)     3.6             Gillian Scarce, MSN, RN  [facility], Revenue Cycle Department, Utilization Review  Phone [phone]   Fax [phone]   [email or web address]

## 2022-11-03 NOTE — Nursing Progress Note (Signed)
Pt refusing morning labs. RN tried talking to pt but pt states "Im tired of them sticking me over and over again and them not getting it." Pt educated on why these labs are important, pt still refused. Will pass onto oncoming nurse. PA notified. Plan of care still ongoing.

## 2022-11-03 NOTE — ED Provider Notes (Signed)
HPI     Chief Complaint   Patient presents with   . Chest Pain   . Shortness of Breath               47 year old female with a history of hypertension who comes in with 4 days of shortness of breath and bilateral lower chest wall pain.  Patient states that she was trying to sleep on Sunday evening and awoke to go to the bathroom but began experiencing shortness of breath and a chest tightness.  She was seen at urgent care on Monday and had workup done including a chest x-ray, troponin, and other blood work that was largely unremarkable.  She was discharged home however states that her pain and shortness of breath have persisted since then.  She has only taken aspirin for her pain because she is allergic to other pain medications including Tylenol and ibuprofen.  She does note a significant history of blood clots in her family and endorses a history of cervical cancer status post a hysterectomy in herself.  She endorses taking her blood pressure medication this morning.                         No data recorded                                Patient History     Past Medical History:   Diagnosis Date   . Hypertension    . Seizures (CMS/HCC)    . Stroke (CMS/HCC)        No past surgical history on file.    No family history on file.    Social History     Tobacco Use   . Smoking status: Never   . Smokeless tobacco: Never   Substance Use Topics   . Alcohol use: Not Currently   . Drug use: Not Currently        Review of Systems     Review of Systems     Physical Exam     ED Triage Vitals   Temp Heart Rate Resp BP   09/21/22 0952 09/21/22 0952 09/21/22 0952 09/21/22 0952   37.1 C (98.8 F) 87 20 (!) 165/102      SpO2 Temp Source Heart Rate Source Patient Position   09/21/22 0952 09/21/22 1325 -- 09/21/22 1328   99 % Oral  Lying      BP Location FiO2 (%)     -- --               Physical Exam  Vitals reviewed.   Constitutional:       Appearance: Normal appearance.   HENT:      Head: Normocephalic and atraumatic.       Mouth/Throat:      Mouth: Mucous membranes are moist.   Eyes:      Conjunctiva/sclera: Conjunctivae normal.   Cardiovascular:      Rate and Rhythm: Normal rate and regular rhythm.      Heart sounds: Normal heart sounds.   Pulmonary:      Effort: Pulmonary effort is normal.      Breath sounds: Normal breath sounds.   Abdominal:      General: Abdomen is flat.      Palpations: Abdomen is soft.   Musculoskeletal:         General: No swelling.  Cervical back: Neck supple.      Right lower leg: No tenderness. No edema.      Left lower leg: No tenderness. No edema.   Skin:     General: Skin is warm and dry.   Neurological:      Mental Status: She is alert.      Motor: No weakness.   Psychiatric:         Mood and Affect: Mood normal.          ED Course & MDM     ED Course as of 11/03/22 1748   Thu Sep 21, 2022   1526 My EKG interpretation 79 bpm normal sinus rhythm poor R wave progression incomplete left bundle branch block pattern nonspecific T wave changes [HC]   1930 The patient feels better and is hemodynamically stable.   Results of exam and studies discussed with patient.  Discussion centered around a general explanation of the problem.  All questions were answered.  Overall, I believe that the results of these discussions were well understood.    The patient was given a copy of the results.  The patient was given instructions on when to return to the ED immediately.  The patient was given names of physicians for close followup on discharge instructions.       [HC]      ED Course User Index  [HC] Rinaldo Cloud, MD         Clinical Impressions as of 11/03/22 1748   COVID       Medical Decision Making  I considered the diagnosis of ACS/MI which was ruled out by normal EKG and heart score of 0 and negative troponin.  I considered the diagnosis of PE which was ruled out by negative VQ scan.        Medications   amLODIPine (NORVASC) tablet 5 mg (5 mg oral Given 09/21/22 1557)   TC-53M MAA radio-isotope injection 4.2  millicurie (4.2 millicuries intravenous Given 09/21/22 1659)   sodium chloride 0.9 % flush 5 mL (5 mL intravenous Given 09/21/22 1659)     Labs Reviewed   SARS-COV2-RNA, RSV, FLU A AND B QUALITATIVE RT-PCR, INTERNAL LAB - Abnormal       Result Value    Influenza A PCR Negative      Influenza B PCR Negative      SARS-COV-2 Screen Positive (*)     RSV PCR Negative     COMPREHENSIVE METABOLIC PANEL - Abnormal    Sodium 141      Potassium 3.5 (*)     Chloride 105      CO2 27      Anion Gap 9      Glucose 106      BUN 12      Creatinine 0.81      eGFR 91      BUN/Creatinine Ratio 14.8      Calcium 9.3      AST (SGOT) 20      ALT (SGPT) 22      Alkaline Phosphatase 118      Total Protein 8.5      Albumin 4.4      Total Bilirubin 0.9     CBC WITH AUTO DIFFERENTIAL - Abnormal    WBC 7.0      RBC 4.97      Hemoglobin 15.3      Hematocrit 47.1 (*)     MCV 94.8      MCH 30.8  MCHC 32.5      RDW 12.8      Platelets 154      MPV 14.1 (*)     Neutrophils Relative 56.5      Immature Granulocytes Relative 0.3      Lymphocytes Relative 26.8      Monocytes Relative 9.6      Eosinophils Relative 6.1      Basophils Relative 0.7      Neutrophils Absolute 3.96      Immature Granulocytes Absolute 0.02      Lymphocytes Absolute 1.88      Monocytes Absolute 0.67      Eosinophils Absolute 0.43      Basophils Absolute 0.05      NRBC 0.0     D-DIMER - Abnormal    D-Dimer, Quant (D-DU) 275 (*)     Narrative:     A D-dimer result < 230 ng/mL indicates a low likelihood of PE or DVT.  Correlation with clinical and imaging findings, if available, is recommended.    D-Dimer results are reported in ng/mL. These units correspond to ng/mL of D-Dimer Units (D-DU).   TROPONIN I HIGH SENSITIVITY - Normal    High Sensitivity Troponin I 6     HCG, SERUM, QUALITATIVE WITH REFLEX TO QUANTITATIVE - Normal    hCG Qual Negative     CREATINE KINASE - Normal    Total CK 215     CBC AND DIFFERENTIAL    Narrative:     The following orders were created for panel  order CBC and differential.  Procedure                               Abnormality         Status                     ---------                               -----------         ------                     CBC auto differential[[id number]]       Abnormal            Final result                 Please view results for these tests on the individual orders.     [facility] Lung Perfusion Imaging   Final Result   Negative for pulmonary embolus.      Electronically Signed:   Wray Kearns, MD   2022/09/21 at 18:19 EDT   Reading Location ID and State: [id number] / [place]   Tel [phone], Service support  [phone], Fax [phone]         XR Chest 1 View   Final Result   Unremarkable PA view of the chest.      -------- FINAL REPORT --------   Dictated By: Ivar Drape    Dictated Date: 09/21/2022 10:21   Assigned Physician: Ivar Drape    Reviewed and Electronically Signed By: Ivar Drape    Signed Date: 09/21/2022 10:22   Workstation ID: [id number]   Transcribed By: Self Edit    Transcribed Date: 09/21/2022 10:21  Procedures           Rinaldo Cloud, MD  11/03/22 1731       Rinaldo Cloud, MD  11/03/22 [phone]

## 2022-11-03 NOTE — Progress Notes (Signed)
MEDICINE PROGRESS NOTE  [facility], DIVISION OF HOSPITALIST MEDICINE   [facility]   [place] Pager: [phone]      Date Time: 11/03/22   Patient Name: [name] Cook  Attending Physician: Illene Regulus, MD  Today's date: 11/03/2022  Hospital Day: 5    CC: CVA (cerebrovascular accident due to intracerebral hemorrhage)    [name] Cook is 47 y.o. F with hx of hypertension,hyperlipidemia, prediabetes,CVA presenting with chest pain ,sob, and left side headache.She was found to have right side facial droop.Facial droop started while in ED. 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  # Hx of stroke  # Headache s/p Mg  - A1C 5.7, LDL 116  - ECG - NSR  - CTH negative  - MRI head, MRA head/neck - no acute abnormality  - started on valtrex and prednisone for  total seven days  - intractable to medical management  - started on Depakote,magnesium, benadryl and toradol  - c/w statin, holding ASA and heparin for LP  - optimize BP control-> amlodipine increased to 10 mg/d 10/23  -c/w telemetry  - plan for LP today    # Essential hypertension  -  increased amlodipine 10 mg daily and  hydralazine 50 mg Q8H, and lisinopril 20 mg/d    # Hyperlipidemia  - c/w statin    # Seizure disorder  - c/w Keppra    # Atypical chest pain  - ECG with non specific T wave changes, NSR,HS trop neg x 2  - VQ with low Prob for PE, LE venous doppler - no DVT  - CXR small bibasilar atelectasis  - normal oxygen saturation  - improved    # Prediabetes   - monitor sugars  - reinforce diet modification        DVT prophylaxis :Heparin    Disposition : 8/21    Case discussed with:Pt, staff ,neurology      Subjective/ROS     Patient seen and examined.No new complaints, continues to have intractable headache, no neck pain or stiffness, CP resolved , No SOB/dizziness/weakness  No urinary complaint  No abdominal pain/N/V  All other systems reviewed  and are negative.      Physical Exam:       Temp:  [97.7 F (36.5 C)-98.4 F (36.9 C)] 98.4 F (36.9 C)  Heart Rate:  [67-100] 100  Resp Rate:  [16-20] 18  BP: (134-187)/(73-118) 156/96  Intake and Output Summary-last 24 Hrs:  I/O last 3 completed shifts:  In: 680 [P.O.:680]  Out: -   No data recorded by O2 Device: None (Room air)    Vitals and nursing note reviewed. Exam conducted with a chaperone present.   Gen: Normal appearance, no distress   Mouth: Mucous membranes are moist.   Eyes: Normal conjunctivae, NIS  Cardiovascular: S1S2 RRR   Pulmonary: normal effort, CTAB  Abdomen: S/NT/ND/+BS  MSK: no LE edema  Neurological: Aox3, no gross abnormality ,+Right facial droop  Psych: normal behavior, normal mood       Meds:   Medications were reviewed:  Scheduled Meds:  Current Facility-Administered Medications   Medication Dose Route Frequency    amLODIPine  10 mg Oral Daily    [Held by provider] aspirin  81 mg Oral Daily    Or    [Held by provider] aspirin  300 mg Rectal Daily  atorvastatin  40 mg Oral QHS    famotidine  20 mg Oral Q12H SCH    [Held by provider] heparin (porcine)  5,000 Units Subcutaneous Q8H SCH    hydrALAZINE  25 mg Oral Q8H SCH    insulin lispro  1-3 Units Subcutaneous QHS    insulin lispro  1-5 Units Subcutaneous TID AC    levETIRAcetam  500 mg Oral Q12H SCH    lisinopril  20 mg Oral Daily    methocarbamol (ROBAXIN) 500 mg in dextrose 5 % 250 mL IVPB  500 mg Intravenous Q8H SCH    ondansetron  4 mg Intravenous Once    predniSONE  60 mg Oral Daily    pregabalin  25 mg Oral TID    valACYclovir HCL  1,000 mg Oral TID    valproate sodium  500 mg Intravenous Q8H     Continuous Infusions:      PRN Meds:.dextrose **OR** dextrose **OR** dextrose **OR** glucagon (rDNA), diphenhydrAMINE, diphenhydrAMINE, gadoterate meglumine, hydrALAZINE, HYDROmorphone, labetalol, LORazepam, ondansetron **OR** ondansetron  DVT prophylaxis Chemical and Mechanical        Lines:     Patient Lines/Drains/Airways Status        Active PICC Line / CVC Line / PIV Line / Drain / Airway / Intraosseous Line / Epidural Line / ART Line / Line / Wound / Pressure Ulcer / NG/OG Tube       Name Placement date Placement time Site Days    Peripheral IV 10/31/22 20 G Anterior;Distal;Left Upper Arm 10/31/22  0046  Upper Arm  less than 1    Peripheral IV 10/31/22 20 G Anterior;Left Forearm 10/31/22  0047  Forearm  less than 1                       Labs/Radiology:   Imaging personally reviewed, including: all available   US Venous Low Extrem Duplx Dopp Comp Bilat    Result Date: 11/02/2022   No sonographic evidence for right or left lower extremity deep venous thrombosis. Mills Koller, MD 11/02/2022 12:58 PM    NM Lung Scan Perfusion w/Spect CT    Result Date: 11/01/2022   Normal perfusion lung scan. Collene Schlichter, MD 11/01/2022 3:46 PM    MRI Brain WO Contrast    Result Date: 11/01/2022   1.No acute intracranial abnormality. Leota Jacobsen, MD 11/01/2022 8:02 AM    MR Angiogram Neck WO Contrast    Result Date: 10/30/2022   1.There is no stenosis of the proximal right internal carotid artery based on NASCET criteria. 2.There is no stenosis of the proximal left internal carotid artery based on NASCET criteria. 3.The cervical vertebral arteries are patent. 4.The intracranial arteries are patent, no proximal intracranial large vessel occlusion.  2 Alex [name], MD 10/30/2022 9:52 PM    MR Angiogram Head WO Contrast    Result Date: 10/30/2022   1.There is no stenosis of the proximal right internal carotid artery based on NASCET criteria. 2.There is no stenosis of the proximal left internal carotid artery based on NASCET criteria. 3.The cervical vertebral arteries are patent. 4.The intracranial arteries are patent, no proximal intracranial large vessel occlusion.  2 Alex [name], MD 10/30/2022 9:52 PM    MRI brain without contrast    Result Date: 10/30/2022   1.Negative MRI brain. No acute infarction. Alex [name], MD 10/30/2022 9:47 PM    CT  Head WO Contrast    Result Date: 10/30/2022  No acute intracranial abnormality. [name], MD 10/30/2022 4:04 PM    XR Chest PA Or AP    Result Date: 10/30/2022   Small foci of basilar atelectasis. [name], MD 10/30/2022 2:44 PM    XR Chest PA Lateral And Apical Lordotic    Result Date: 10/08/2022  IMPRESSION: No acute cardiopulmonary abnormality. Thank you for this referral to [facility] [place] [employer] in partnership with [facility].   For further consultation, please call [phone] or [phone] (phones answered 24/7/365).  Electronically Signed by Francina Ames, MD 10/08/2022 8:11 PM   Recent Labs     11/03/22  1203 11/03/22  0804 11/02/22  2108 11/02/22  1628   Whole Blood Glucose POCT 152* 135* 187* 171*     Recent Labs   Lab 11/03/22  0550 11/02/22  0633 11/01/22  0540 10/31/22  0556   Sodium 138 141 141 143   Potassium 4.3 4.1 4.0 3.6   Chloride 109 108 110 108   BUN 16 14 10 9    Creatinine 0.9 0.9 0.9 0.8   GFR >60.0 >60.0 >60.0 >60.0   Glucose 145* 117* 121* 141*   Calcium 9.5 9.5 8.9 9.1     Recent Labs   Lab 11/03/22  1002 11/02/22  0633 11/01/22  0640 10/31/22  0556   WBC 16.50* 14.74* 7.75 7.33   Hemoglobin 14.6 14.1 13.6 13.6   Hematocrit 45.8* 43.0 41.1 41.2   Platelet Count 170 173 145 146     Recent Labs   Lab 11/03/22  1126 10/31/22  0554   PT 11.0 12.9   INR 0.9 1.1   PTT 32 33     Recent Labs   Lab 10/31/22  0556 10/30/22  1411   Alkaline Phosphatase 106 122*   Bilirubin, Total 1.0 0.9   Bilirubin Direct 0.3  --    ALT 20 23   AST (SGOT) 18 22     US Venous Low Extrem Duplx Dopp Comp Bilat    Result Date: 11/02/2022   No sonographic evidence for right or left lower extremity deep venous thrombosis. Mills Koller, MD 11/02/2022 12:58 PM    NM Lung Scan Perfusion w/Spect CT    Result Date: 11/01/2022   Normal perfusion lung scan. Collene Schlichter, MD 11/01/2022 3:46 PM    MRI Brain WO Contrast    Result Date: 11/01/2022   1.No acute  intracranial abnormality. Leota Jacobsen, MD 11/01/2022 8:02 AM    MR Angiogram Neck WO Contrast    Result Date: 10/30/2022   1.There is no stenosis of the proximal right internal carotid artery based on NASCET criteria. 2.There is no stenosis of the proximal left internal carotid artery based on NASCET criteria. 3.The cervical vertebral arteries are patent. 4.The intracranial arteries are patent, no proximal intracranial large vessel occlusion.  2 Alex [name], MD 10/30/2022 9:52 PM    MR Angiogram Head WO Contrast    Result Date: 10/30/2022   1.There is no stenosis of the proximal right internal carotid artery based on NASCET criteria. 2.There is no stenosis of the proximal left internal carotid artery based on NASCET criteria. 3.The cervical vertebral arteries are patent. 4.The intracranial arteries are patent, no proximal intracranial large vessel occlusion.  2 Alex [name], MD 10/30/2022 9:52 PM    MRI brain without contrast    Result Date: 10/30/2022   1.Negative MRI brain. No acute infarction. Alex [name], MD 10/30/2022  9:47 PM    CT Head WO Contrast    Result Date: 10/30/2022   No acute intracranial abnormality. [name], MD 10/30/2022 4:04 PM    XR Chest PA Or AP    Result Date: 10/30/2022   Small foci of basilar atelectasis. [name], MD 10/30/2022 2:44 PM    XR Chest PA Lateral And Apical Lordotic    Result Date: 10/08/2022  IMPRESSION: No acute cardiopulmonary abnormality. Thank you for this referral to [facility] [place], [employer] in partnership with [facility].   For further consultation, please call [phone] or [phone] (phones answered 24/7/365).  Electronically Signed by Francina Ames, MD 10/08/2022 8:11 PM        This note was generated by the Epic EMR system/ Dragon speech recognition and may contain inherent errors or omissions not intended by the user. Grammatical errors, random word insertions, deletions and pronoun  errors  are occasional consequences of this technology due to software limitations. Not all errors are caught or corrected. If there are questions or concerns about the content of this note or information contained within the body of this dictation they should be addressed directly with the author for clarification.  Signed by: Illene Regulus, MD

## 2022-11-03 NOTE — Progress Notes (Addendum)
RN was called by radiology to schedule this patients LP for this afternoon. Patient states "If I am not knocked out asleep then I am not doing it." MD made aware. Radiology aware of possible cancellation awaiting MD answer.     At this time patient has refused medication Depacon. Stating " I have not taken this before I need to speak to the MD." MD notified.

## 2022-11-03 NOTE — Progress Notes (Signed)
CM Update:      [place] Plan- pending therapy recommendation.Otherwise, pt is to return home.     [place] transportation: no limitation.     Anticipated [place] per today's MDR- Per Dr. Verne Grain, likely today after LP.         Berline Chough, BSN, RN  RN Case Manager   [phone]  (F): [phone]

## 2022-11-03 NOTE — Plan of Care (Signed)
Problem: Pain interferes with ability to perform ADL  Goal: Pain at adequate level as identified by patient  Outcome: Progressing  Flowsheets (Taken 11/03/2022 0534)  Pain at adequate level as identified by patient:   Identify patient comfort function goal   Assess for risk of opioid induced respiratory depression, including snoring/sleep apnea. Alert healthcare team of risk factors identified.   Assess pain on admission, during daily assessment and/or before any "as needed" intervention(s)   Reassess pain within 30-60 minutes of any procedure/intervention, per Pain Assessment, Intervention, Reassessment (AIR) Cycle   Evaluate if patient comfort function goal is met   Consult/collaborate with Physical Therapy, Occupational Therapy, and/or Speech Therapy   Offer non-pharmacological pain management interventions   Include patient/patient care companion in decisions related to pain management as needed   Consult/collaborate with Pain Service   Evaluate patient's satisfaction with pain management progress     Problem: Side Effects from Pain Analgesia  Goal: Patient will experience minimal side effects of analgesic therapy  Outcome: Progressing  Flowsheets (Taken 11/03/2022 0534)  Patient will experience minimal side effects of analgesic therapy:   Monitor/assess patient's respiratory status (RR depth, effort, breath sounds)   Prevent/manage side effects per LIP orders (i.e. nausea, vomiting, pruritus, constipation, urinary retention, etc.)   Evaluate for opioid-induced sedation with appropriate assessment tool (i.e. POSS)   Assess for changes in cognitive function     Problem: Every Day - Stroke  Goal: Core/Quality measure requirements - Daily  Outcome: Progressing  Flowsheets (Taken 11/03/2022 0534)  Core/Quality measure requirements - Daily:   VTE Prevention: Ensure anticoagulant(s) administered and/or anti-embolism stockings/devices documented by end of day 2   Ensure antithrombotic administered or contraindication  documented by LIP by end of day 2   Once lipid panel has resulted, check LDL. Contact provider for statin order if LDL > 70 (or ensure contraindication documented by LIP).   Continue stroke education (must include Modifiable Risk Factors, Warning Signs and Symptoms of Stroke, Activation of Emergency Medical System and Follow-up Appointments). Ensure handout has been given and documented.  Goal: Neurological status is stable or improving  Outcome: Progressing  Flowsheets (Taken 11/03/2022 0534)  Neurological status is stable or improving:   Monitor/assess/document neurological assessment (Stroke: every 4 hours)   Perform CAM Assessment   Observe for seizure activity and initiate seizure precautions if indicated  Goal: Stable vital signs and fluid balance  Outcome: Progressing  Flowsheets (Taken 11/03/2022 0534)  Stable vital signs and fluid balance:   Monitor and assess vitals every 4 hours or as ordered and hemodynamic parameters   Apply telemetry monitor as ordered   Monitor intake and output. Notify LIP if urine output is < 30 mL/hour.   Position patient for maximum circulation/cardiac output   Encourage oral fluid intake  Goal: Patient will maintain adequate oxygenation  Outcome: Progressing  Flowsheets (Taken 11/03/2022 0534)  Patient will maintain adequate oxygenation: Maintain SpO2 of greater than 92%  Goal: Patient's risk of aspiration will be minimized  Outcome: Progressing  Flowsheets (Taken 11/03/2022 0534)  Patient's risk of aspiration will be minimized:   Monitor/assess for signs of aspiration (tachypnea, cough, wheezing, clearing throat, hoarseness after eating, decrease in SaO2   Assess and monitor ability to swallow   Place patient up in chair to eat, if possible   Instruct patient to take small bites   Instruct patient to take small single sips of liquid   Supervise patient during oral intake  Goal: Mobility/Activity is maintained at optimal level  for patient  Outcome: Progressing  Flowsheets (Taken  11/03/2022 0534)  Mobility/activity is maintained at optimal level for patient: Encourage independent activity per ability  Goal: Skin integrity is maintained or improved  Outcome: Progressing  Flowsheets (Taken 11/03/2022 0534)  Skin integrity is maintained or improved:   Assess Braden Scale every shift   Increase activity as tolerated/progressive mobility  Goal: Neurovascular status is stable or improving  Outcome: Progressing  Flowsheets (Taken 11/03/2022 0534)  Neurovascular status is stable or improving:   Monitor/assess neurovascular status (pulses, capillary refill, pain, paresthesia, presence of edema)   Monitor/assess for signs of Venous Thrombus Emboli (edema of calf/thigh redness, pain)   Monitor/assess site of invasive procedure for signs of bleeding     Problem: Safety  Goal: Patient will be free from injury during hospitalization  Outcome: Progressing  Flowsheets (Taken 11/03/2022 0534)  Patient will be free from injury during hospitalization:   Assess patient's risk for falls and implement fall prevention plan of care per policy   Use appropriate transfer methods   Include patient/ family/ care giver in decisions related to safety   Assess for patients risk for elopement and implement Elopement Risk Plan per policy   Provide alternative method of communication if needed (communication boards, writing)   Hourly rounding   Ensure appropriate safety devices are available at the bedside   Provide and maintain safe environment  Goal: Patient will be free from infection during hospitalization  Outcome: Progressing  Flowsheets (Taken 11/03/2022 0534)  Free from Infection during hospitalization:   Assess and monitor for signs and symptoms of infection   Monitor all insertion sites (i.e. indwelling lines, tubes, urinary catheters, and drains)   Encourage patient and family to use good hand hygiene technique   Monitor lab/diagnostic results     Problem: Pain  Goal: Pain at adequate level as identified by  patient  Outcome: Progressing  Flowsheets (Taken 11/03/2022 0534)  Pain at adequate level as identified by patient:   Identify patient comfort function goal   Assess for risk of opioid induced respiratory depression, including snoring/sleep apnea. Alert healthcare team of risk factors identified.   Assess pain on admission, during daily assessment and/or before any "as needed" intervention(s)   Reassess pain within 30-60 minutes of any procedure/intervention, per Pain Assessment, Intervention, Reassessment (AIR) Cycle   Evaluate if patient comfort function goal is met   Consult/collaborate with Physical Therapy, Occupational Therapy, and/or Speech Therapy   Offer non-pharmacological pain management interventions   Include patient/patient care companion in decisions related to pain management as needed   Consult/collaborate with Pain Service   Evaluate patient's satisfaction with pain management progress

## 2022-11-04 LAB — CSF MENINGITIS/ENCEPHALITIS PATHOGEN PANEL, PCR
CSF Cryptococcus neoformans/gattii DNA: NOT DETECTED
CSF Cytomegalovirus DNA: NOT DETECTED
CSF Enterovirus RNA: NOT DETECTED
CSF Eschericia coli K1 DNA: NOT DETECTED
CSF Haemophilus influenza DNA: NOT DETECTED
CSF Herpes simplex virus 1 DNA: NOT DETECTED
CSF Herpes simplex virus 2 DNA: NOT DETECTED
CSF Human herpesvirus 6 DNA: NOT DETECTED
CSF Human parechovirus RNA: NOT DETECTED
CSF Listeria monocytogenes DNA: NOT DETECTED
CSF Neisseria meningitidis (encapsulated) DNA: NOT DETECTED
CSF Streptococcus agalactiae DNA: NOT DETECTED
CSF Streptococcus pneumoniae DNA: NOT DETECTED
CSF Varicella zoster virus DNA: NOT DETECTED

## 2022-11-04 LAB — WHOLE BLOOD GLUCOSE POCT
Whole Blood Glucose POCT: 145 mg/dL — ABNORMAL HIGH (ref 70–100)
Whole Blood Glucose POCT: 114 mg/dL — ABNORMAL HIGH (ref 70–100)
Whole Blood Glucose POCT: 216 mg/dL — ABNORMAL HIGH (ref 70–100)
Whole Blood Glucose POCT: 245 mg/dL — ABNORMAL HIGH (ref 70–100)

## 2022-11-04 LAB — CULTURE AND GRAM STAIN, AEROBIC, CSF: Gram Stain: NONE SEEN

## 2022-11-04 MED ORDER — VALACYCLOVIR HCL 1 G PO TABS
1000.0000 mg | ORAL_TABLET | Freq: Three times a day (TID) | ORAL | 0 refills | Status: AC
Start: 2022-11-04 — End: 2022-11-07

## 2022-11-04 MED ORDER — PREGABALIN 50 MG PO CAPS
50.0000 mg | ORAL_CAPSULE | Freq: Three times a day (TID) | ORAL | 0 refills | Status: DC
Start: 2022-11-04 — End: 2023-03-10

## 2022-11-04 MED ORDER — PREDNISONE 20 MG PO TABS
60.0000 mg | ORAL_TABLET | Freq: Every day | ORAL | 0 refills | Status: AC
Start: 2022-11-05 — End: 2022-11-08

## 2022-11-04 MED ORDER — AMLODIPINE BESYLATE 10 MG PO TABS
10.0000 mg | ORAL_TABLET | Freq: Every day | ORAL | 0 refills | Status: DC
Start: 2022-11-04 — End: 2023-05-25

## 2022-11-04 NOTE — Discharge Summary (Signed)
Discharge Summary    Date:11/04/2022   Patient Name: [name] Cook  Attending Physician: Illene Regulus, MD    Date of Admission:   10/30/2022    Date of Discharge:   November 04, 2022      Discharge  Diagnosis:   Headache  Right facial droop due to  possible Bell's Palsy   Class 3 obesity with BMI 46.7    Additional Diagnoses:       Treatment Team:   Treatment Team:   Attending Provider: Illene Regulus, MD     Procedures performed:   Radiology: all results from this admission  FL Guided Lumbar Puncture Diagnostic    Result Date: 11/03/2022   Successful lumbar puncture under fluoroscopic guidance. Beaulah Dinning, MD 11/03/2022 6:46 PM    US Venous Low Extrem Duplx Dopp Comp Bilat    Result Date: 11/02/2022   No sonographic evidence for right or left lower extremity deep venous thrombosis. Mills Koller, MD 11/02/2022 12:58 PM    NM Lung Scan Perfusion w/Spect CT    Result Date: 11/01/2022   Normal perfusion lung scan. Collene Schlichter, MD 11/01/2022 3:46 PM    MRI Brain WO Contrast    Result Date: 11/01/2022   1.No acute intracranial abnormality. Leota Jacobsen, MD 11/01/2022 8:02 AM    MR Angiogram Neck WO Contrast    Result Date: 10/30/2022   1.There is no stenosis of the proximal right internal carotid artery based on NASCET criteria. 2.There is no stenosis of the proximal left internal carotid artery based on NASCET criteria. 3.The cervical vertebral arteries are patent. 4.The intracranial arteries are patent, no proximal intracranial large vessel occlusion.  2 Alex [name], MD 10/30/2022 9:52 PM    MR Angiogram Head WO Contrast    Result Date: 10/30/2022   1.There is no stenosis of the proximal right internal carotid artery based on NASCET criteria. 2.There is no stenosis of the proximal left internal carotid artery based on NASCET criteria. 3.The cervical vertebral arteries are patent. 4.The intracranial arteries are patent, no proximal intracranial large vessel occlusion.  2 Alex [name], MD  10/30/2022 9:52 PM    MRI brain without contrast    Result Date: 10/30/2022   1.Negative MRI brain. No acute infarction. Alex [name], MD 10/30/2022 9:47 PM    CT Head WO Contrast    Result Date: 10/30/2022   No acute intracranial abnormality. [name], MD 10/30/2022 4:04 PM    XR Chest PA Or AP    Result Date: 10/30/2022   Small foci of basilar atelectasis. [name], MD 10/30/2022 2:44 PM    XR Chest PA Lateral And Apical Lordotic    Result Date: 10/08/2022  IMPRESSION: No acute cardiopulmonary abnormality. Thank you for this referral to [facility], [employer] in partnership with [facility].   For further consultation, please call [phone] or [phone] (phones answered 24/7/365).  Electronically Signed by Francina Ames, MD 10/08/2022 8:11 PM     Reason for Admission:     CC: CVA (cerebrovascular accident due to intracerebral hemorrhage)    See H&P for full details regarding this patient's admission   [name] Cook is 47 y.o. F with hx of hypertension,hyperlipidemia, prediabetes,CVA,seizure disorder, presenting with chest pain ,sob, and left side headache.She was found to have right side facial droop.Facial droop started while in ED. No focal weakness or change in sensation.Has associated headache.Patient has multiple admissions  related to right facial  droop and cp  per records with several negative work up.Last admission 5/8 through 5/10 with similar symptom.   Hospital Course/Problems managed during admission     # Intractable headache  # Recurrent Right Facial droop , it was previously resumed to be psychogenic during her admission at [facility] in 03/11/21 with several admissions related to same complaint  # Hx of stroke  # Headache s/p Mg  - A1C 5.7, LDL 116  - ECG - NSR  - CTH negative  - MRI head negative x 2 , MRA head/neck - no acute abnormality  - started on valtrex and prednisone for  total seven days for possible bell's palsy by  neurology  - CSF studies normal  - started on Depakote,magnesium, benadryl and toradol  - c/w statin, holding ASA  - declined depakote, avoid dilaudid (reports allergy to fentanyl, fioricent,flexeril,ibuprofen,tramadol,percocet,toradol and tylenol), would avoid dependence to hydromorphone. Will [place] on Lyrica per neuro recommendations  - Cleared by neurology for discharge    # Essential hypertension  -  increased amlodipine 10 mg daily and  hydralazine 25 mg Q8H, and lisinopril 20 mg/d     # Hyperlipidemia  - c/w statin     # Seizure disorder  - c/w Keppra     # Atypical chest pain   - ECG with non specific T wave changes, NSR,HS trop neg x 2  - VQ with low Prob for PE, LE venous doppler - no DVT  - CXR small bibasilar atelectasis  - normal oxygen saturation  - improved     # Prediabetes   - monitor sugars  - reinforce diet modification    [place] instructions reviewed with patient.Seen and examined with RN at the bedside.Cleared by Neurology for discharge     Condition at Discharge:   Medically stable  Today:     BP 128/80   Pulse 70   Temp 97.9 F (36.6 C) (Oral)   Resp 17   Ht 1.778 m (5\' 10" )   Wt (!) 147.6 kg (325 lb 8 oz)   LMP 10/15/2018 (Exact Date)   SpO2 94%   BMI 46.70 kg/m   Ranges for the last 24 hours:  Temp:  [97.2 F (36.2 C)-98.2 F (36.8 C)] 97.9 F (36.6 C)  Heart Rate:  [61-77] 70  Resp Rate:  [17-18] 17  BP: (128-154)/(70-87) 128/80        Vital Signs and Nursing notes reviewed  General: awake; No acute distress.  Cardiovascular: Normal S1 and S2, no murmurs  Lungs: clear to auscultation bilaterally, no distress  Abdomen: soft, non-tender, non-distended; +BS  Extremities: no edema or tenderness  Neuro: alert, oriented x 3, right facial droop, no focal deficit on my exam  Psych: Normal behavior and mood       Last set of labs   Recent Labs   Lab 11/03/22  1002   WBC 16.50*   Hemoglobin 14.6   Hematocrit 45.8*   Platelet Count 170     Recent Labs   Lab 11/03/22  0550   Sodium 138   Potassium  4.3   Chloride 109   CO2 21   BUN 16   Creatinine 0.9   GFR >60.0   Glucose 145*   Calcium 9.5     Recent Labs   Lab 11/03/22  0550 11/01/22  0540 10/31/22  0556   Bilirubin, Total  --   --  1.0   Bilirubin Direct  --   --  0.3  Protein, Total  --   --  7.2   Albumin 3.6  More results in Results Review 3.6  3.6   ALT  --   --  20   AST (SGOT)  --   --  18   More results in Results Review = values in this interval not displayed.     Recent Labs   Lab 11/03/22  1126   PT 11.0   INR 0.9   PTT 32     Recent Labs   Lab 10/31/22  0556   TSH 3.62         Recent Labs   Lab 10/30/22  1844 10/30/22  1411   hs Troponin 4.4 3.2     Microbiology Results (last 15 days)       Procedure Component Value Units Date/Time    Culture and Gram Stain, Aerobic, CSF [[id number]]     Order Status: Canceled Specimen: Cerebrospinal Fluid from CSF - Lumbar Puncture     Culture And Gram Stain, Aerobic Bacteria, Wound/Tissue/Fluid [[id number]] Collected: 11/03/22 1549    Order Status: Canceled Specimen: Body Fluid (Other) from Spine, Lumbar Updated: 11/03/22 1641    CSF Meningitis/Encephalitis Pathogen Panel, PCR [[id number]]  (Normal) Collected: 11/03/22 1549    Order Status: Completed Specimen: Cerebrospinal Fluid from CSF - Lumbar Puncture Updated: 11/04/22 0201     CSF Eschericia coli K1 DNA Not Detected     CSF Haemophilus influenza DNA Not Detected     CSF Listeria monocytogenes DNA Not Detected     CSF Neisseria meningitidis (encapsulated) DNA Not Detected     CSF Streptococcus agalactiae DNA Not Detected     CSF Streptococcus pneumoniae DNA Not Detected     CSF Cytomegalovirus DNA Not Detected     CSF Enterovirus RNA Not Detected     CSF Herpes simplex virus 1 DNA Not Detected     CSF Herpes simplex virus 2 DNA Not Detected     CSF Human herpesvirus 6 DNA Not Detected     CSF Human parechovirus RNA Not Detected     CSF Varicella zoster virus DNA Not Detected     CSF Cryptococcus neoformans/gattii DNA Not Detected    Narrative:      Test  performed with the [employer] Meningitis/Encephalitis (ME) PCR Panel. A negative result does not rule out central nervous system infection and results from this panel are not intended to be used as the sole basis for diagnosis, treatment, or other patient management decisions. Consider pathogen specific testing if FilmArray ME is negative but clinical suspicion is high for a particular infection. False negative results could be due to the presence of strains with genetic variability in the target regions, concentration of pathogen nucleic acid below the limit of detection, or improper specimen handling. This test has not been specifically evaluated with CSF from immunocompromised patients and this test may be affected by concurrent antimicrobial therapy. Positive results may be due to detection of non-viable organism. Clinical correlation is required. False positives may result from contamination during specimen collection or processing. This test should not be used for monitoring treatment of infection.    Culture and Gram Stain, Aerobic, CSF [[id number]] Collected: 11/03/22 1549    Order Status: Completed Specimen: Cerebrospinal Fluid from CSF - Lumbar Puncture Updated: 11/04/22 0059     Gram Stain No WBCs seen      No organisms seen     Comment: Performed on cytospin concentrated specimen  Micro / Labs / Path pending:     Unresulted Labs       None            Discharge Instructions For Providers     Follow up with PCP to optimize BP control    Stroke Measures:stroke ruled out           Discharge Instructions:      Follow-up Information       Unknown Foley, NP .    Specialty: Nurse Practitioner  Contact information:  [address]  [place] [address]  [phone]                             Discharge Diet: Cardiac Diet          Disposition:  Home or Self Care     Discharge Medication List        Taking      amLODIPine 5 MG tablet  Dose: 5 mg  Commonly known as: NORVASC  Take 1 tablet (5 mg) by  mouth daily     aspirin EC 325 MG tablet  Dose: 325 mg  Take 1 tablet (325 mg) by mouth daily     atorvastatin 80 MG tablet  Dose: 80 mg  Commonly known as: LIPITOR  Take 1 tablet (80 mg) by mouth daily     hydroCHLOROthiazide 25 MG tablet  Dose: 25 mg  Commonly known as: HYDRODIURIL  Take 1 tablet (25 mg) by mouth daily     KEPPRA PO  Dose: 500 mg  Take 500 mg by mouth 2 (two) times daily.      lisinopril 20 MG tablet  Dose: 20 mg  Commonly known as: ZESTRIL  Take 1 tablet (20 mg) by mouth daily     predniSONE 20 MG tablet  Dose: 60 mg  Commonly known as: DELTASONE  Start taking on: November 05, 2022  Take 3 tablets (60 mg) by mouth daily for 3 days     pregabalin 50 MG capsule  Dose: 50 mg  Commonly known as: LYRICA  Take 1 capsule (50 mg) by mouth 3 (three) times daily     valACYclovir 1000 MG tablet  Dose: 1,000 mg  Commonly known as: VALTREX  Take 1 tablet (1,000 mg) by mouth 3 (three) times daily for 3 days            Minutes spent coordinating discharge and reviewing discharge plan: 35 minutes      Signed by: Illene Regulus, MD

## 2022-11-04 NOTE — Progress Notes (Signed)
11/04/22 1438   Discharge Disposition   Patient preference/choice provided? Yes   Physical Discharge Disposition Home   Mode of Transportation Car   Patient/Family/POA notified of transfer plan Yes   Patient agreeable to discharge plan/expected d/c date? Yes   Family/POA agreeable to discharge plan/expected d/c date? Yes   Bedside nurse notified of transport plan? Yes   CM Interventions   Follow up appointment scheduled? Yes   Is this a Medicare focused or COVID patient? No   Is this appointment within 48-72 hours? Yes   Referral made for home health RN visit? No, Other (comment)   Multidisciplinary rounds/family meeting before d/c? Yes   Medicare Checklist   Is this a Medicare patient? No   Patient received 1st IMM Letter? No

## 2022-11-04 NOTE — Discharge Instr - AVS First Page (Addendum)
Reason for your Hospital Admission/ Instructions    You were admitted to [facility] with a diagnosis of  Right facial palsy and headache on 10/30/22. All the studies done including MRI/MRA,CT scan of the brain, Spinal tap came back normal.    At time of discharge, you will continue the medication as listed in your "After Visit Summary." Take these as prescribed. If you have further questions or concerns, call your primary care provider. Additionally, please schedule a follow-up with your primary care provider within 7-10 days of discharge. For further instructions, please see "patient instructions" below.   Please follow up with your primary care doctor

## 2022-11-04 NOTE — Plan of Care (Signed)
Problem: Pain interferes with ability to perform ADL  Goal: Pain at adequate level as identified by patient  Outcome: Progressing     Problem: Safety  Goal: Patient will be free from injury during hospitalization  Outcome: Progressing     Problem: Pain  Goal: Pain at adequate level as identified by patient  Outcome: Progressing

## 2022-11-04 NOTE — Progress Notes (Addendum)
Date Time: 11/04/22 3:05 PM  Patient Name: [name] Cook  Attending Physician: Illene Regulus, MD  Patient Class: Inpatient  Hospital Day: 1            NEUROLOGY PROGRESS NOTE             Assessment/Plan   Possible left-sided partial Bell's palsy second brain MRI negative this was done without contrast because patient is allergic---> lumbar puncture also totally negative  Still ongoing left-sided headache of note patient with no prior history of headaches  Elevated blood pressure readings,?  Hypertension related head pain    MRI brain MRA head and neck are negative  Lumbar puncture totally normal    Remain on valacyclovir and prednisone  Trial with Lyrica patient did not get any last night, patient's documentation in the chart indicates that the patient refused but she says that she never got any medications given last nightdepending on how patient responds to Lyrica we will decide further course of action may be giving her a higher dose of Solu-Medrol 125/250     Subjective:   Patient Seen and Examined.      August 24 continues to complain of severe headache LP already completed yesterday negative  August 23 continues to complain of headache facial droop      No change in facial asymmetry headache remains, left temporoparietal, vertex region, throbbing, at 6/10 intensity    Review of Systems:   No eye, ear nose, throat problems; no coughing or wheezing or shortness of breath,   No chest pain or orthopnea, no abdominal pain, nausea or vomiting,   No pain in the body or extremities, no psychiatric, neurological, endocrine, hematological   or cardiac complaints except as noted above.          Physical Exam:   BP 145/85   Pulse 62   Temp 97.5 F (36.4 C) (Oral)   Resp 17   Ht 1.778 m (5\' 10" )   Wt (!) 147.6 kg (325 lb 8 oz)   LMP 10/15/2018 (Exact Date)   SpO2 100%   BMI 46.70 kg/m     Neuro:  Level of consciousness: Awake alert oriented x 3 looks somewhat uncomfortable left facial asymmetry remains  unchanged moving both upper lower extremities well  Meds:      Scheduled Meds: PRN Meds:    amLODIPine, 10 mg, Oral, Daily  aspirin, 81 mg, Oral, Daily   Or  aspirin, 300 mg, Rectal, Daily  atorvastatin, 40 mg, Oral, QHS  famotidine, 20 mg, Oral, Q12H SCH  heparin (porcine), 5,000 Units, Subcutaneous, Q8H SCH  hydrALAZINE, 50 mg, Oral, Q8H SCH  insulin lispro, 1-3 Units, Subcutaneous, QHS  insulin lispro, 1-5 Units, Subcutaneous, TID AC  levETIRAcetam, 500 mg, Oral, Q12H SCH  lisinopril, 20 mg, Oral, Daily  ondansetron, 4 mg, Intravenous, Once  predniSONE, 60 mg, Oral, Daily  pregabalin, 50 mg, Oral, TID  valACYclovir HCL, 1,000 mg, Oral, TID        Continuous Infusions:   dextrose, 15 g of glucose, PRN   Or  dextrose, 12.5 g, PRN   Or  dextrose, 12.5 g, PRN   Or  glucagon (rDNA), 1 mg, PRN  diphenhydrAMINE, 25 mg, Q6H PRN  diphenhydrAMINE, 6.25 mg, Q6H PRN  gadoterate meglumine, 20 mL, ONCE PRN  hydrALAZINE, 10 mg, Q3H PRN  HYDROmorphone, 2 mg, Q4H PRN  labetalol, 10 mg, Q15 Min PRN  LORazepam, 1 mg, Q4H PRN  ondansetron, 4 mg, Q6H PRN   Or  ondansetron, 4 mg, Q6H PRN                Labs:     Recent Labs   Lab 11/03/22  0550 11/02/22  0633 11/01/22  0540 10/31/22  0556 10/30/22  1411   Glucose 145* 117* 121* 141* 62*   BUN 16 14 10 9 12    Creatinine 0.9 0.9 0.9 0.8 0.9   Calcium 9.5 9.5 8.9 9.1 9.8   Sodium 138 141 141 143 145   Potassium 4.3 4.1 4.0 3.6 3.3*   Chloride 109 108 110 108 110   CO2 21 24 22 28 29    Albumin 3.6 3.4* 3.6 3.6  3.6 4.0   Phosphorus 3.4 4.3 3.6 3.6 2.6   Magnesium  --  2.4 2.3 2.2 2.2   AST (SGOT)  --   --   --  18 22   ALT  --   --   --  20 23   Bilirubin, Total  --   --   --  1.0 0.9   Alkaline Phosphatase  --   --   --  106 122*     Recent Labs   Lab 11/03/22  1002 11/02/22  0633 11/01/22  0640   WBC 16.50* 14.74* 7.75   Hemoglobin 14.6 14.1 13.6   Hematocrit 45.8* 43.0 41.1   MCV 98.3* 96.0 96.5*   MCH 31.3 31.5 31.9   MCHC 31.9 32.8 33.1   Platelet Count 170 173 145                  Lab Results   Component Value Date    TSH 3.62 10/31/2022               .ll    Recent Labs     11/03/22  1126   PTT 32   PT 11.0   INR 0.9          Radiology Results (24 Hour)       Procedure Component Value Units Date/Time    [facility] Guided Lumbar Puncture Diagnostic [[id number]] Collected: 11/03/22 1840    Order Status: Completed Updated: 11/03/22 1848    Narrative:      HISTORY: Intractable headache and blurry vision.    COMPARISON: None available.    PROCEDURE: Procedure benefits, risks (including, but not limited to  headache, pain, bleeding, infection, nerve damage, and CSF leak), and  alternatives were explained to the patient, and a written informed consent  was obtained.The patient was advised that some post lumbar puncture  headaches may require treatment with an autologous epidural blood patch.  All questions were answered. The patient desired to proceed.    Handwashing was performed, sterile gloves and face mask were used. A pre  procedure pause was observed and patient's identity, type of the procedure,  and laterality were verified and confirmed.    The patient was positioned prone on the fluoroscopy table and a needle  insertion site for a lumbar puncture at the L2-L3 level was marked on the  skin. That area was prepped and draped in the usual appropriate sterile  fashion. Approximately 5.0 mL of 1% lidocaine was used for local analgesia.  A 4.75-inch 20 gauge spinal needle was advanced into the spinal canal  utilizing fluoroscopic guidance. Clear colorless cerebrospinal fluid was  obtained.     The opening pressure was measured at 26 cm H2O. Approximately 15 mL of  cerebrospinal fluid was obtained and distributed into 4 tubes.  The closing  pressure was then measured at 19 cm H2O.    The needle was then withdrawn and the needle insertion site was cleansed  and dressed in sterile fashion. The patient was discharged in stable  condition. There were no apparent immediate complications.    Fluoroscopy time: 5  secs   Cumulative Air Kerma: 1.25 mGy       Impression:         Successful lumbar puncture under fluoroscopic guidance.    Beaulah Dinning, MD  11/03/2022 6:46 PM             All brain imaging (MRI, CT) personally reviewed.    Case discussed with: pt and rn               I personally reviewed all of the medications.  Medication list generated using all available resources.  Elder abuse (physical)  - negative  Advanced care plan - reviewed from chart or in discussion with pt or family        This note was generated by the Epic EMR system/ Dragon speech recognition and may contain inherent errors or omissions not intended by the user. Grammatical errors, random word insertions, deletions and pronoun errors  are occasional consequences of this technology due to software limitations.   Not all errors are caught or corrected. If there are questions or concerns about the content of this note or information contained within the body of this dictation they should be addressed directly with the author for clarification.      Signed by: Cathe Mons, MD, MD  Spectralink: [id number]      Answering Service: [phone]

## 2022-11-04 NOTE — Plan of Care (Signed)
Problem: Pain interferes with ability to perform ADL  Goal: Pain at adequate level as identified by patient  Outcome: Progressing  Flowsheets (Taken 11/03/2022 0534)  Pain at adequate level as identified by patient:   Identify patient comfort function goal   Assess for risk of opioid induced respiratory depression, including snoring/sleep apnea. Alert healthcare team of risk factors identified.   Assess pain on admission, during daily assessment and/or before any "as needed" intervention(s)   Reassess pain within 30-60 minutes of any procedure/intervention, per Pain Assessment, Intervention, Reassessment (AIR) Cycle   Evaluate if patient comfort function goal is met   Consult/collaborate with Physical Therapy, Occupational Therapy, and/or Speech Therapy   Offer non-pharmacological pain management interventions   Include patient/patient care companion in decisions related to pain management as needed   Consult/collaborate with Pain Service   Evaluate patient's satisfaction with pain management progress     Problem: Side Effects from Pain Analgesia  Goal: Patient will experience minimal side effects of analgesic therapy  Outcome: Progressing  Flowsheets (Taken 11/03/2022 0534)  Patient will experience minimal side effects of analgesic therapy:   Monitor/assess patient's respiratory status (RR depth, effort, breath sounds)   Prevent/manage side effects per LIP orders (i.e. nausea, vomiting, pruritus, constipation, urinary retention, etc.)   Evaluate for opioid-induced sedation with appropriate assessment tool (i.e. POSS)   Assess for changes in cognitive function     Problem: Every Day - Stroke  Goal: Core/Quality measure requirements - Daily  Outcome: Progressing  Flowsheets (Taken 11/03/2022 0534)  Core/Quality measure requirements - Daily:   VTE Prevention: Ensure anticoagulant(s) administered and/or anti-embolism stockings/devices documented by end of day 2   Ensure antithrombotic administered or contraindication  documented by LIP by end of day 2   Once lipid panel has resulted, check LDL. Contact provider for statin order if LDL > 70 (or ensure contraindication documented by LIP).   Continue stroke education (must include Modifiable Risk Factors, Warning Signs and Symptoms of Stroke, Activation of Emergency Medical System and Follow-up Appointments). Ensure handout has been given and documented.  Goal: Neurological status is stable or improving  Outcome: Progressing  Flowsheets (Taken 11/03/2022 0534)  Neurological status is stable or improving:   Monitor/assess/document neurological assessment (Stroke: every 4 hours)   Perform CAM Assessment   Observe for seizure activity and initiate seizure precautions if indicated  Goal: Stable vital signs and fluid balance  Outcome: Progressing  Flowsheets (Taken 11/03/2022 0534)  Stable vital signs and fluid balance:   Monitor and assess vitals every 4 hours or as ordered and hemodynamic parameters   Apply telemetry monitor as ordered   Monitor intake and output. Notify LIP if urine output is < 30 mL/hour.   Position patient for maximum circulation/cardiac output   Encourage oral fluid intake  Goal: Patient will maintain adequate oxygenation  Outcome: Progressing  Flowsheets (Taken 11/03/2022 0534)  Patient will maintain adequate oxygenation: Maintain SpO2 of greater than 92%  Goal: Patient's risk of aspiration will be minimized  Outcome: Progressing  Flowsheets (Taken 11/03/2022 0534)  Patient's risk of aspiration will be minimized:   Monitor/assess for signs of aspiration (tachypnea, cough, wheezing, clearing throat, hoarseness after eating, decrease in SaO2   Assess and monitor ability to swallow   Place patient up in chair to eat, if possible   Instruct patient to take small bites   Instruct patient to take small single sips of liquid   Supervise patient during oral intake  Goal: Mobility/Activity is maintained at optimal level  for patient  Outcome: Progressing  Flowsheets (Taken  11/03/2022 0534)  Mobility/activity is maintained at optimal level for patient: Encourage independent activity per ability  Goal: Skin integrity is maintained or improved  Outcome: Progressing  Flowsheets (Taken 11/03/2022 0534)  Skin integrity is maintained or improved:   Assess Braden Scale every shift   Increase activity as tolerated/progressive mobility  Goal: Neurovascular status is stable or improving  Outcome: Progressing  Flowsheets (Taken 11/03/2022 0534)  Neurovascular status is stable or improving:   Monitor/assess neurovascular status (pulses, capillary refill, pain, paresthesia, presence of edema)   Monitor/assess for signs of Venous Thrombus Emboli (edema of calf/thigh redness, pain)   Monitor/assess site of invasive procedure for signs of bleeding     Problem: Safety  Goal: Patient will be free from injury during hospitalization  Outcome: Progressing  Flowsheets (Taken 11/03/2022 0534)  Patient will be free from injury during hospitalization:   Assess patient's risk for falls and implement fall prevention plan of care per policy   Use appropriate transfer methods   Include patient/ family/ care giver in decisions related to safety   Assess for patients risk for elopement and implement Elopement Risk Plan per policy   Provide alternative method of communication if needed (communication boards, writing)   Hourly rounding   Ensure appropriate safety devices are available at the bedside   Provide and maintain safe environment  Goal: Patient will be free from infection during hospitalization  Outcome: Progressing  Flowsheets (Taken 11/03/2022 0534)  Free from Infection during hospitalization:   Assess and monitor for signs and symptoms of infection   Monitor all insertion sites (i.e. indwelling lines, tubes, urinary catheters, and drains)   Encourage patient and family to use good hand hygiene technique   Monitor lab/diagnostic results     Problem: Pain  Goal: Pain at adequate level as identified by  patient  Outcome: Progressing  Flowsheets (Taken 11/03/2022 0534)  Pain at adequate level as identified by patient:   Identify patient comfort function goal   Assess for risk of opioid induced respiratory depression, including snoring/sleep apnea. Alert healthcare team of risk factors identified.   Assess pain on admission, during daily assessment and/or before any "as needed" intervention(s)   Reassess pain within 30-60 minutes of any procedure/intervention, per Pain Assessment, Intervention, Reassessment (AIR) Cycle   Evaluate if patient comfort function goal is met   Consult/collaborate with Physical Therapy, Occupational Therapy, and/or Speech Therapy   Offer non-pharmacological pain management interventions   Include patient/patient care companion in decisions related to pain management as needed   Consult/collaborate with Pain Service   Evaluate patient's satisfaction with pain management progress

## 2022-11-04 NOTE — Nursing Progress Note (Signed)
Shift Note:       Neuro: A&O x 4  Cardio:  NS on tele  Respiratory:  RA  Ambulation: Currently 1 assist.   Pain:  expressed lower back pain though out shift  Pt Behavior: calm and cooperative.      GI/GU: no BM on shift  Fall Score: High  Diet:  Regular-     Comments: VSS. Meds given as ordered. Refused depacon- stated "it would make things worse when she took it in the past.: Complaints of pain lower back. Fall precautions in place. Pt unsteady on feet. Pt unset at treatment she is receiving and is feeling like she is being put put with put feeling and better.     For patient safety precautions bed alarm on, bed in lowest position, non-skid foot wear on, bedside table within reach, white board updated, room free from clutter. Pt instructed to call when needed. Call bell & pt belongings within reach. Purposeful hourly rounding occurred. Husband at bedside. POC reviewed with pt and husband. Care ongoing.

## 2022-11-05 LAB — CULTURE AND GRAM STAIN, AEROBIC, CSF
Culture CSF: NO GROWTH
Gram Stain: NONE SEEN

## 2022-11-05 LAB — WHOLE BLOOD GLUCOSE POCT: Whole Blood Glucose POCT: 179 mg/dL — ABNORMAL HIGH (ref 70–100)

## 2022-11-05 MED ORDER — HYDROMORPHONE HCL 1 MG/ML IJ SOLN
2.0000 mg | Freq: Once | INTRAMUSCULAR | Status: AC
Start: 2022-11-05 — End: 2022-11-05
  Administered 2022-11-05: 2 mg via INTRAVENOUS
  Filled 2022-11-05: qty 2

## 2022-11-05 NOTE — Plan of Care (Signed)
Notified by RN pt c/o severe back pain and headache  Pt states back pain started after LP and has progressed  Endorses weakness walking to BR, validated by RN  Site of LP inspected, no overt bleeding noted, no swelling, signif pain on palpation    #Back pain   - ? Spinal epidural hematoma  - will order MRI lumbar spine but pt has contrast allergy so will have to be without contrast  - dilaudid x1 for pain      Philippa Chester, NP

## 2022-11-05 NOTE — Plan of Care (Signed)
Problem: Pain interferes with ability to perform ADL  Goal: Pain at adequate level as identified by patient  Outcome: Completed     Problem: Side Effects from Pain Analgesia  Goal: Patient will experience minimal side effects of analgesic therapy  Outcome: Completed     Problem: Day of Admission - Stroke  Goal: Core/Quality measure requirements - Admission  Outcome: Completed     Problem: Every Day - Stroke  Goal: Core/Quality measure requirements - Daily  Outcome: Completed  Goal: Neurological status is stable or improving  Outcome: Completed  Goal: Stable vital signs and fluid balance  Outcome: Completed  Goal: Patient will maintain adequate oxygenation  Outcome: Completed  Goal: Patient's risk of aspiration will be minimized  Outcome: Completed  Goal: Nutritional intake is adequate  Outcome: Completed  Goal: Elimination patterns are normal or improving  Outcome: Completed  Goal: Mobility/Activity is maintained at optimal level for patient  Outcome: Completed  Goal: Skin integrity is maintained or improved  Outcome: Completed  Goal: Neurovascular status is stable or improving  Outcome: Completed  Goal: Effective coping demonstrated  Outcome: Completed  Goal: Will be able to express needs and understand communication  Outcome: Completed     Problem: Day of Discharge - Stroke  Goal: Able to express understanding of discharge instructions and stroke education  Outcome: Completed  Goal: Core/Quality measures - Discharge  Outcome: Completed     Problem: Safety  Goal: Patient will be free from injury during hospitalization  Outcome: Completed  Goal: Patient will be free from infection during hospitalization  Outcome: Completed     Problem: Pain  Goal: Pain at adequate level as identified by patient  Outcome: Completed     Problem: Discharge Barriers  Goal: Patient will be discharged home or other facility with appropriate resources  Outcome: Completed     Problem: Compromised Activity/Mobility  Goal: Activity/Mobility  Interventions  Outcome: Completed     Problem: Compromised Friction/Shear  Goal: Friction and Shear Interventions  Outcome: Completed

## 2022-11-05 NOTE — Nursing Progress Note (Signed)
Pt refused Lyrica this morning, pt stated "last night it made me feel too jittery and didn't help with the pain". NP notified. Plan of care ongoing

## 2022-11-05 NOTE — Plan of Care (Signed)
Problem: Pain interferes with ability to perform ADL  Goal: Pain at adequate level as identified by patient  Outcome: Progressing  Flowsheets (Taken 11/03/2022 0534)  Pain at adequate level as identified by patient:   Identify patient comfort function goal   Assess for risk of opioid induced respiratory depression, including snoring/sleep apnea. Alert healthcare team of risk factors identified.   Assess pain on admission, during daily assessment and/or before any "as needed" intervention(s)   Reassess pain within 30-60 minutes of any procedure/intervention, per Pain Assessment, Intervention, Reassessment (AIR) Cycle   Evaluate if patient comfort function goal is met   Consult/collaborate with Physical Therapy, Occupational Therapy, and/or Speech Therapy   Offer non-pharmacological pain management interventions   Include patient/patient care companion in decisions related to pain management as needed   Consult/collaborate with Pain Service   Evaluate patient's satisfaction with pain management progress     Problem: Side Effects from Pain Analgesia  Goal: Patient will experience minimal side effects of analgesic therapy  Outcome: Progressing  Flowsheets (Taken 11/03/2022 0534)  Patient will experience minimal side effects of analgesic therapy:   Monitor/assess patient's respiratory status (RR depth, effort, breath sounds)   Prevent/manage side effects per LIP orders (i.e. nausea, vomiting, pruritus, constipation, urinary retention, etc.)   Evaluate for opioid-induced sedation with appropriate assessment tool (i.e. POSS)   Assess for changes in cognitive function     Problem: Every Day - Stroke  Goal: Core/Quality measure requirements - Daily  Outcome: Progressing  Flowsheets (Taken 11/03/2022 0534)  Core/Quality measure requirements - Daily:   VTE Prevention: Ensure anticoagulant(s) administered and/or anti-embolism stockings/devices documented by end of day 2   Ensure antithrombotic administered or contraindication  documented by LIP by end of day 2   Once lipid panel has resulted, check LDL. Contact provider for statin order if LDL > 70 (or ensure contraindication documented by LIP).   Continue stroke education (must include Modifiable Risk Factors, Warning Signs and Symptoms of Stroke, Activation of Emergency Medical System and Follow-up Appointments). Ensure handout has been given and documented.  Goal: Neurological status is stable or improving  Outcome: Progressing  Flowsheets (Taken 11/03/2022 0534)  Neurological status is stable or improving:   Monitor/assess/document neurological assessment (Stroke: every 4 hours)   Perform CAM Assessment   Observe for seizure activity and initiate seizure precautions if indicated  Goal: Stable vital signs and fluid balance  Outcome: Progressing  Flowsheets (Taken 11/03/2022 0534)  Stable vital signs and fluid balance:   Monitor and assess vitals every 4 hours or as ordered and hemodynamic parameters   Apply telemetry monitor as ordered   Monitor intake and output. Notify LIP if urine output is < 30 mL/hour.   Position patient for maximum circulation/cardiac output   Encourage oral fluid intake  Goal: Patient will maintain adequate oxygenation  Outcome: Progressing  Flowsheets (Taken 11/03/2022 0534)  Patient will maintain adequate oxygenation: Maintain SpO2 of greater than 92%  Goal: Neurovascular status is stable or improving  Outcome: Progressing  Flowsheets (Taken 11/03/2022 0534)  Neurovascular status is stable or improving:   Monitor/assess neurovascular status (pulses, capillary refill, pain, paresthesia, presence of edema)   Monitor/assess for signs of Venous Thrombus Emboli (edema of calf/thigh redness, pain)   Monitor/assess site of invasive procedure for signs of bleeding     Problem: Safety  Goal: Patient will be free from injury during hospitalization  Outcome: Progressing  Flowsheets (Taken 11/03/2022 0534)  Patient will be free from injury during hospitalization:  Assess  patient's risk for falls and implement fall prevention plan of care per policy   Use appropriate transfer methods   Include patient/ family/ care giver in decisions related to safety   Assess for patients risk for elopement and implement Elopement Risk Plan per policy   Provide alternative method of communication if needed (communication boards, writing)   Hourly rounding   Ensure appropriate safety devices are available at the bedside   Provide and maintain safe environment  Goal: Patient will be free from infection during hospitalization  Outcome: Progressing  Flowsheets (Taken 11/03/2022 0534)  Free from Infection during hospitalization:   Assess and monitor for signs and symptoms of infection   Monitor all insertion sites (i.e. indwelling lines, tubes, urinary catheters, and drains)   Encourage patient and family to use good hand hygiene technique   Monitor lab/diagnostic results     Problem: Pain  Goal: Pain at adequate level as identified by patient  Outcome: Progressing  Flowsheets (Taken 11/03/2022 0534)  Pain at adequate level as identified by patient:   Identify patient comfort function goal   Assess for risk of opioid induced respiratory depression, including snoring/sleep apnea. Alert healthcare team of risk factors identified.   Assess pain on admission, during daily assessment and/or before any "as needed" intervention(s)   Reassess pain within 30-60 minutes of any procedure/intervention, per Pain Assessment, Intervention, Reassessment (AIR) Cycle   Evaluate if patient comfort function goal is met   Consult/collaborate with Physical Therapy, Occupational Therapy, and/or Speech Therapy   Offer non-pharmacological pain management interventions   Include patient/patient care companion in decisions related to pain management as needed   Consult/collaborate with Pain Service   Evaluate patient's satisfaction with pain management progress

## 2022-11-05 NOTE — Nursing Progress Note (Signed)
VSS. A&O x4.   Patient denies any pain.  pt was [place] home with Husband  IV lines removed.   Tele removed and returned.   Belongings accounted for and signed off by pt.  Discharge education given. Acknowledged understanding of discharge instructions and all questions were answered.    Pt was wheeled out of the unit at 10:40 am by a staff.

## 2022-11-07 NOTE — UM Notes (Signed)
Requesting final auth to be faxed to [phone] or call [phone]        Auth Number :  [id number]    Discharge Date -  11/05/2022 10:45 AM    ER ADMIT DATE AND TIME: 10/30/2022  3:09 PM  OBS ADMIT DATE: 10/30/2022  3:09 PM  IP ADMIT DATE: 11/03/2022                NAME: Narya Luquin             MR#: [id number]      Patient Address:  [address]  [place] [address]    Patient phone: [phone] (home)     PATIENT NAME: LANIQUE, WHITCHER  DOB: 23-Apr-1975   PMH:  has a past medical history of Hyperlipidemia, Hypertension, Migraine, Morbid obesity, Palpitations, Seizures, Stroke, Thoracic aortic aneurysm, and TIA (transient ischemic attack) (2017).  PSH:  has a past surgical history that includes APPENDECTOMY (OPEN); Cholecystectomy; EGD, BIOPSY (N/A, 04/16/2017); and Hysterectomy.      DIAGNOSIS:     ICD-10-CM    1. Facial droop  R29.810 Activity as tolerated     Adult diet heart healthy      2. CVA (cerebrovascular accident due to intracerebral hemorrhage)  I61.9           Information sent by a UR Department Case Management Assistant

## 2022-11-10 NOTE — Progress Notes (Signed)
AMBULATORY CARE MANAGEMENT Outreach Call    Hospital patient admitted to: [facility] Admission/Discharge Dates:   10/30/22 to 11/05/22     Reason for admission to hospital: Headache     Name: [name] Cook    ### Patient Details  Date of Birth: 02-01-1976  MRN: [id number]    ### Encounter Details  Arrival Date: 10/30/2022 03:09 PM EDT  Discharge Date: 11/05/2022 10:45 AM EDT  Encounter ID: [id number] TCM8/25/2024  10:45:00AM    ### Related interaction  [facility] - TCM V2 Post Discharge Outreach (Post Discharge TCM V2 Outreach 1) ([email or web address])    ### Required Interventions and Feedback     Call Status         Call Status:     No Attempt (edited by MF on 11/10/2022 07:28 AM EDT)    Comments::     Patient received an automated post-discharge outreach call.  Person accepting call hung up  No further action needed at this time   (edited by MF on 11/10/2022 07:28 AM EDT)    Collene Mares BSN, RN, CCM  RN Case Manager II  Ambulatory Care Management  [address] [place], [place] [address]  Ph: [phone]

## 2022-12-12 NOTE — ED Provider Notes (Signed)
Patient Evaluated: 12/12/2022     Means of arrival:Car        CC / HPI                                                                                                                                                                           Chief Complaint from Triage Note:   "Patient presents with:  Pain, Chest  Shortness Of Breath  Pt ambulatory to triage c/o chest pain that radiates to the back and SOB starting yesterday."      HPI:  [name] Cook is a 47 y.o. female who presents for evaluation of chest pain and chest pressure.  Pressure noted to be consistent since onset.  Patient notes symptoms acutely worsened at 0500 hrs. today.  Patient notes she developed shortness of breath yesterday.  Patient denies cough, abdominal pain, fevers, chills.  Patient is endorsing radiation of pain into the neck and back, pain in the arms and legs, intermittent lightheadedness.  No additional medications used prior to arrival.  Patient did believe that this was acid reflux, attempted Maalox last night without benefit.       Relevant Past Medical, Social and Family History - See resident note (if applicable) for additional historical information   Past Medical / Surgical History:   Past Medical History:   Diagnosis Date   . Cancer (CMS/HCC)    . Cervical cancer (CMS/HCC)     s/p TAH by Dr. Hillary Bow   . CVA (cerebral vascular accident) (CMS/HCC) 2015    After HTN emergency; recovered    . HLD (hyperlipidemia)    . HTN (hypertension)    . Hypertension 12/15/1992   . Morbid obesity with BMI of 40.0-44.9, adult (CMS/HCC)    . Seizure disorder (CMS/HCC)    . 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 TIA (transient ischemic attack)       Past Surgical History:   Procedure Laterality Date   . COLPORRHAPHY N/A 06/08/2020    POSTERIOR REPAIR performed by Harvest Forest, MD at [facility] MAIN OR   . CYSTOSCOPY N/A 06/22/2020    CYSTOSCOPY performed by Harvest Forest, MD at [facility] MAIN OR   . CYSTOSCOPY N/A 08/04/2020     SLING REMOVAL,CYSTOSCOPY performed by Harvest Forest, MD at [facility] MAIN OR   . CYSTOSCOPY BIOPSY BLADDER N/A 06/22/2020    CYSTOSCOPY BIOPSY BLADDER performed by Harvest Forest, MD at [facility] MAIN OR   . ENTERSCOPY N/A 05/17/2020    PUSH ENTEROSCOPY performed by Epifania Gore, MD at [facility] ENDO OR   . ESOPHAGOGASTRODUODENOSCOPY Bilateral 05/17/2020    ESOPHAGOGASTRODUODENOSCOPY performed by Epifania Gore, MD at [facility] ENDO OR   .  HX APPENDECTOMY  01/29/18   . HX HYSTERECTOMY     . HYSTERECTOMY ABDOMINAL  2021   . PERINEOPLASTY N/A 06/08/2020    PERINEOPLASTY performed by Harvest Forest, MD at [facility] MAIN OR   . REMOVAL / REVISION OF SLING FOR STRESS INCONTINENCE N/A 06/22/2020     EXAMINATION UNDER ANESTHESIA   performed by Harvest Forest, MD at [facility] MAIN OR   . SLING PUBOVAGINAL N/A 06/08/2020    SLING VAGINAL WITH ALTIS performed by Harvest Forest, MD at [facility] MAIN OR     Family History: family history includes Breast Cancer in her sister; Cervical Cancer in her sister; Diabetes mellitus in her father and mother; Hypertension in her father and mother.  Social Hx:  reports that she has never smoked. She has never used smokeless tobacco. She reports that she does not currently use alcohol. She reports that she does not use drugs.   Current Outpatient Medications on File Prior to Encounter   Medication Last Dose   . albuterol 108 (90 BASE) mcg/act IN aerosol solution    . albuterol 2.5 mg/62mL nebulizer solution    . aspirin 325 MG EC tablet    . atorvastatin (LIPITOR) 80 MG tablet    . benzonatate (TESSALON) 200 MG capsule    . levETIRAcetam (KEPPRA) 500 MG tablet    . lisinopril (ZESTRIL) 20 MG tablet    . methylPREDNISolone (MEDROL) 4 MG tablet therapy pack      ALLERGIES: Acetaminophen, Butalbital-apap-caffeine, Cyclobenzaprine, Hydrocodone-acetaminophen, Ibuprofen, Iodinated Contrast - Iv Or Oral, Iodinated Contrast Media, Ketorolac Tromethamine,  Nsaids, Oxycodone-acetaminophen, Shellfish-derived Products, Tramadol, Ceftriaxone, Ciprofloxacin, Dicyclomine, Egg-derived Products, Metoclopramide, Promethazine      Review of Systems - See HPI    As reviewed above.      Physical Exam     LAST  VS BP: (!) 162/95 Pulse: 60 (12/12/22 0808)  Heart Rate (Monitored): 56 (12/12/22 1401) Resp: 12  SpO2: 98 % Temp: 36.3 C (97.4 F)   FIRST  VS BP: (!) 163/98 Pulse: 60 (12/12/22 0808)  Heart Rate (Monitored): 60 (12/12/22 0808) Resp: 20  SpO2: 96 % Temp: 36.3 C (97.4 F)   SHOCK INDEX BASED ON LATEST VITAL SIGNS (PULSE/SBP): .37  **These vitals reflect the first/last taken during this encounter. Within each group, they are not necessarily taken at the same time.    Physical Exam  Constitutional:       General: She is not in acute distress.     Appearance: She is not ill-appearing, toxic-appearing or diaphoretic.      Comments: Uncomfortable appearing   HENT:      Head: Normocephalic and atraumatic.      Mouth/Throat:      Mouth: Mucous membranes are moist.      Pharynx: Oropharynx is clear. No oropharyngeal exudate or posterior oropharyngeal erythema.   Eyes:      Extraocular Movements: Extraocular movements intact.   Cardiovascular:      Rate and Rhythm: Normal rate.      Pulses: Normal pulses.   Pulmonary:      Effort: Pulmonary effort is normal. No respiratory distress.      Breath sounds: No stridor. No wheezing, rhonchi or rales.   Chest:      Chest wall: No tenderness.   Abdominal:      General: Bowel sounds are normal. There is no distension.      Palpations: Abdomen is soft.      Tenderness: There is no abdominal  tenderness.   Musculoskeletal:         General: Normal range of motion.      Cervical back: Normal range of motion. No rigidity.   Lymphadenopathy:      Cervical: No cervical adenopathy.   Skin:     General: Skin is warm and dry.      Capillary Refill: Capillary refill takes less than 2 seconds.   Neurological:      General: No focal deficit present.       Mental Status: She is alert and oriented to person, place, and time.   Psychiatric:         Mood and Affect: Mood normal.         Behavior: Behavior normal.          Diagnostic Studies   Results for orders placed or performed during the hospital encounter of 12/12/22   BMP   Result Value Ref Range    Sodium 142 136 - 145 mmol/L    Potassium 3.5 3.5 - 5.1 mmol/L    Chloride 105 98 - 107 mmol/L    CO2 Total 25 22 - 29 mmol/L    Anion Gap 12 8 - 18 mmol/L    Glucose Bld 126 (H) 70 - 100 mg/dL    BUN 15 6 - 23 mg/dL    Creatinine 0.7 0.7 - 1.2 mg/dL    Calcium 8.7 8.6 - 16.1 mg/dL   Troponin T 5th Gen, High Sensitivity   Result Value Ref Range    Troponin T 5th Gen, High Sensitivity <6 <=14 ng/L   CBC with Auto Diff   Result Value Ref Range    Platelets 167 150 - 475 10*3/uL    WBC 8.4 4.5 - 10.6 10*3/uL    RBC 4.22 3.80 - 5.10 10    Hemoglobin 12.8 12.0 - 14.0 g/dL    Hematocrit 09.6 (L) 41.0 - 47.0 %    MCV 93.6 80.0 - 99.0 fL    MCH 30.3 26.0 - 33.0 pg    MCHC 32.4 32.0 - 35.0 g/dL    MPV 04.5 40.9 - 81.1 fL    RDW 13 10 - 13 %    Nucleated RBC % 0.0 %    Nucleated RBC Abs 0.00 0.00 - 0.00 10*3/uL   HCG Qual Serum   Result Value Ref Range    HCG Beta Subunit - QuaL Negative Negative   SARS CoV-2 (COVID-19) with Influenza A, B and RSV, NAA   Result Value Ref Range    Resp Virus PCR Source NaspSwab     SARS-CoV-2 (COVID-19 PCR) RNA Not Detected Not Detected    Influenza A Not Detected Not Detected    Influenza B Not Detected Not Detected    Respiratory Syncytial Virus Not Detected Not Detected    Patient Symptomatic? Yes     Hospitalized? No     ICU? No     Pregnant? Not Pregnant    D Dimer   Result Value Ref Range    D Dimer Quant 0.71 (H) 0.27 - 0.50 mcg/mL FEU   Automated Differential   Result Value Ref Range    Neutros% 64.4 %    Neutros Abs 5.44 1.80 - 7.90 10*3/uL    Lymphs % 25.2 %    Lymphs Abs 2.13 1.10 - 4.20 10*3/uL    Monos % 7.1 %    Monos Abs 0.60 0.00 - 1.00 10*3/uL    Eos % 2.6 %  Eosinophils Abs  0.22 0.00 - 0.50 10*3/uL    Basophil % 0.5 %    Basophil Abs 0.04 0.00 - 0.21 10*3/uL    Immature Grans (Abs) 0.02 0.00 - 0.53 10*3/uL    Immature Grans % 0.2 0.0 - 5.0 %   Estimated GFR   Result Value Ref Range    EGFR Result 108 >=60   EKG 12 Lead With Troponin T Lab Draw   Result Value Ref Range    Ventricular Rate 53 BPM    Atrial Rate 53 BPM    P-R Interval 166 ms    QRS Duration 88 ms    Q-T Interval 488 ms    QTC Calculation (Bazett) 457 ms    P Axis 37 degrees    R Axis 4 degrees    T Axis -9 degrees     I independently reviewed the above laboratory studies and when relevant, compared with previous values if available.    XR Chest 2 Views    Result Date: 12/12/2022  STUDY: Chest x-ray TECHNIQUE: PA and lateral films of the chest. INDICATION: Acute onset chest pressure radiating to back with associated shortness of breath COMPARISON: CXR 09/30/2022 FINDINGS: Unchanged cardiomediastinal and aortic silhouettes. Trachea is midline. Unremarkable hila. Minimal right basilar dependent changes. No focal consolidations, pleural effusion, or pneumothorax.  Lungs are normally aerated. No acute osseous abnormalities.     IMPRESSION: No acute cardiopulmonary process. END OF IMPRESSION I have personally reviewed the images and agree with and/or edited the report.              Medical Decision Making and Critical Care      Patient is a 47 y.o. female presents for evaluation of chest pressure.  Vital signs were significant for elevated blood pressure on presentation. Differential includes myocardial ischemia versus PE versus pulmonary injury.  Please note the patient was requesting morphine however noted that he had previous anaphylaxis to morphine, I did discuss with the patient that she would need premedication to obtain CT scan at receiving hospital and he would be unsafe to give multiple doses of diphenhydramine, at this time safest option was to defer morphine administration.    ED Course as of 12/12/22 1604   Tue Dec 12, 2022   0810 EKG per my interpretation:  Rate 61. Axis normal.  PR, QRS, QTc within normal limits..  No appreciation of ST segment elevation or depression with reciprocal change.   Sinus rhythm. compared to EKG from July 2024 T wave inversion in lead III is consistent. [KD]   1045 Notified of the patient is having worsening chest pain and nausea, Zofran ordered. [KD]   1045 D Dimer(!):    D-Dimer, Quant 0.71(!)  With the patient's symptoms would be appropriate to order CTA.  Patient had anaphylaxis requiring intubation and ICU stay even with premedication.  At this time the patient best be served at full service hospital.  With critical care services available. [KD]   1059 Heart score 5. [KD]   1113 Patient accepted for ER [facility] for planning of CTA. [KD]   1157 EKG per my interpretation:  Rate 53. Axis normal.  PR, QRS, QTc within normal limits.  No appreciation of ST segment elevation or depression with reciprocal change..  Sinus bradycardia.  Compared to EKG from earlier today no acute change. [KD]      ED Course User Index  [KD] Guido Sander, MD  Clinical management tool was used below.  Patient transferred in stable condition.             Procedures:  Procedures    No procedures for this encounter.        [id number]                  Clinical Impression and Disposition    Diagnosis:  1. Acute chest pain    2. Elevated d-dimer      ED Disposition: Transfer - 12/12/2022 11:15  Condition at time of disposition: Stable                    Guido Sander, MD  12/12/22 1606

## 2022-12-12 NOTE — ED Provider Notes (Signed)
Patient Evaluated: 12/12/2022 - 20:43 - [facility]    Means of arrival:Car        CC / HPI                                                                                                                                                                           Chief Complaint from Triage Note:   "Patient presents with:  Pain, Chest  Shortness Of Breath  Pt ambulatory to triage c/o chest pain that radiates to the back and SOB starting yesterday."      HPI:  [name] Cook is a 47 y.o. female who presents with chest pain, shortness of breath, and elevated d-dimer. Patient reports chest pain and shortness of breath beginning early this morning. The pain radiates into the neck and back. Patient was evaluated at [facility] ED earlier today. Her results were significant for an elevated d-dimer. Patient has had a prior anaphylactic reaction to contrast dye, and therefore, was unable to undergo a CTA chest. Patient was transferred to [facility] for further evaluation.        Relevant Past Medical, Social and Family History - See resident note (if applicable) for additional historical information   Past Medical / Surgical History:   Past Medical History:   Diagnosis Date   . Cancer (CMS/HCC)    . Cervical cancer (CMS/HCC)     s/p TAH by Dr. Hillary Bow   . CVA (cerebral vascular accident) (CMS/HCC) 2015    After HTN emergency; recovered    . HLD (hyperlipidemia)    . HTN (hypertension)    . Hypertension 12/15/1992   . Morbid obesity with BMI of 40.0-44.9, adult (CMS/HCC)    . Seizure disorder (CMS/HCC)    . 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 TIA (transient ischemic attack)       Past Surgical History:   Procedure Laterality Date   . COLPORRHAPHY N/A 06/08/2020    POSTERIOR REPAIR performed by Harvest Forest, MD at [facility] MAIN OR   . CYSTOSCOPY N/A 06/22/2020    CYSTOSCOPY performed by Harvest Forest, MD at [facility] MAIN OR   . CYSTOSCOPY N/A 08/04/2020    SLING REMOVAL,CYSTOSCOPY performed by Harvest Forest, MD at [facility] MAIN OR   . CYSTOSCOPY BIOPSY BLADDER N/A 06/22/2020    CYSTOSCOPY BIOPSY BLADDER performed by Harvest Forest, MD at [facility] MAIN OR   . ENTERSCOPY N/A 05/17/2020    PUSH ENTEROSCOPY performed by Epifania Gore, MD at [facility] ENDO OR   . ESOPHAGOGASTRODUODENOSCOPY Bilateral 05/17/2020    ESOPHAGOGASTRODUODENOSCOPY performed by Epifania Gore, MD at [facility] ENDO OR   . HX APPENDECTOMY  01/29/18   .  HX HYSTERECTOMY     . HYSTERECTOMY ABDOMINAL  2021   . PERINEOPLASTY N/A 06/08/2020    PERINEOPLASTY performed by Harvest Forest, MD at [facility] MAIN OR   . REMOVAL / REVISION OF SLING FOR STRESS INCONTINENCE N/A 06/22/2020     EXAMINATION UNDER ANESTHESIA   performed by Harvest Forest, MD at [facility] MAIN OR   . SLING PUBOVAGINAL N/A 06/08/2020    SLING VAGINAL WITH ALTIS performed by Harvest Forest, MD at [facility] MAIN OR     Family History: family history includes Breast Cancer in her sister; Cervical Cancer in her sister; Diabetes mellitus in her father and mother; Hypertension in her father and mother.  Social Hx:  reports that she has never smoked. She has never used smokeless tobacco. She reports that she does not currently use alcohol. She reports that she does not use drugs.   Current Outpatient Medications on File Prior to Encounter   Medication Last Dose   . albuterol 108 (90 BASE) mcg/act IN aerosol solution    . albuterol 2.5 mg/49mL nebulizer solution    . aspirin 325 MG EC tablet    . atorvastatin (LIPITOR) 80 MG tablet    . benzonatate (TESSALON) 200 MG capsule    . levETIRAcetam (KEPPRA) 500 MG tablet    . lisinopril (ZESTRIL) 20 MG tablet    . methylPREDNISolone (MEDROL) 4 MG tablet therapy pack      ALLERGIES: Acetaminophen, Butalbital-apap-caffeine, Cyclobenzaprine, Hydrocodone-acetaminophen, Ibuprofen, Iodinated Contrast - Iv Or Oral, Iodinated Contrast Media, Ketorolac Tromethamine, Nsaids, Oxycodone-acetaminophen,  Shellfish-derived Products, Tramadol, Ceftriaxone, Ciprofloxacin, Dicyclomine, Egg-derived Products, Metoclopramide, Promethazine      Review of Systems - See HPI and resident note (if applicable)  for additional ROS Information   No LMP recorded (lmp unknown). Patient has had a hysterectomy.  Review of Systems   Constitutional:  Negative for chills and fever.   Respiratory:  Positive for shortness of breath.    Cardiovascular:  Positive for chest pain.   Gastrointestinal:  Negative for abdominal pain, nausea and vomiting.   Musculoskeletal:  Positive for back pain.   Neurological:  Negative for weakness and numbness.         Physical Exam     LAST  VS BP: (!) 162/95 Pulse: 60 (12/12/22 0808)  Heart Rate (Monitored): 56 (12/12/22 1401) Resp: 18  SpO2: 98 % Temp: 36.3 C (97.4 F)   FIRST  VS BP: (!) 163/98 Pulse: 60 (12/12/22 0808)  Heart Rate (Monitored): 60 (12/12/22 0808) Resp: 20  SpO2: 96 % Temp: 36.3 C (97.4 F)   SHOCK INDEX BASED ON LATEST VITAL SIGNS (PULSE/SBP): .37  **These vitals reflect the first/last taken during this encounter. Within each group, they are not necessarily taken at the same time.    Physical Exam  Constitutional:       General: She is not in acute distress.  HENT:      Head: Normocephalic and atraumatic.   Eyes:      Conjunctiva/sclera: Conjunctivae normal.   Cardiovascular:      Rate and Rhythm: Normal rate and regular rhythm.   Pulmonary:      Effort: Pulmonary effort is normal.      Breath sounds: No wheezing, rhonchi or rales.   Abdominal:      General: There is no distension.      Palpations: Abdomen is soft.      Tenderness: There is no abdominal tenderness. There is no guarding or rebound.   Musculoskeletal:  Cervical back: Neck supple.      Right lower leg: No edema.      Left lower leg: No edema.   Skin:     General: Skin is warm and dry.      Capillary Refill: Capillary refill takes less than 2 seconds.   Neurological:      Mental Status: She is alert and oriented to  person, place, and time.            Diagnostic Studies     Results for orders placed or performed during the hospital encounter of 12/12/22   BMP   Result Value Ref Range    Sodium 142 136 - 145 mmol/L    Potassium 3.5 3.5 - 5.1 mmol/L    Chloride 105 98 - 107 mmol/L    CO2 Total 25 22 - 29 mmol/L    Anion Gap 12 8 - 18 mmol/L    Glucose Bld 126 (H) 70 - 100 mg/dL    BUN 15 6 - 23 mg/dL    Creatinine 0.7 0.7 - 1.2 mg/dL    Calcium 8.7 8.6 - 88.4 mg/dL   Troponin T 5th Gen, High Sensitivity   Result Value Ref Range    Troponin T 5th Gen, High Sensitivity <6 <=14 ng/L   CBC with Auto Diff   Result Value Ref Range    Platelets 167 150 - 475 10*3/uL    WBC 8.4 4.5 - 10.6 10*3/uL    RBC 4.22 3.80 - 5.10 10    Hemoglobin 12.8 12.0 - 14.0 g/dL    Hematocrit 16.6 (L) 41.0 - 47.0 %    MCV 93.6 80.0 - 99.0 fL    MCH 30.3 26.0 - 33.0 pg    MCHC 32.4 32.0 - 35.0 g/dL    MPV 06.3 01.6 - 01.0 fL    RDW 13 10 - 13 %    Nucleated RBC % 0.0 %    Nucleated RBC Abs 0.00 0.00 - 0.00 10*3/uL   HCG Qual Serum   Result Value Ref Range    HCG Beta Subunit - QuaL Negative Negative   SARS CoV-2 (COVID-19) with Influenza A, B and RSV, NAA   Result Value Ref Range    Resp Virus PCR Source NaspSwab     SARS-CoV-2 (COVID-19 PCR) RNA Not Detected Not Detected    Influenza A Not Detected Not Detected    Influenza B Not Detected Not Detected    Respiratory Syncytial Virus Not Detected Not Detected    Patient Symptomatic? Yes     Hospitalized? No     ICU? No     Pregnant? Not Pregnant    D Dimer   Result Value Ref Range    D Dimer Quant 0.71 (H) 0.27 - 0.50 mcg/mL FEU   Automated Differential   Result Value Ref Range    Neutros% 64.4 %    Neutros Abs 5.44 1.80 - 7.90 10*3/uL    Lymphs % 25.2 %    Lymphs Abs 2.13 1.10 - 4.20 10*3/uL    Monos % 7.1 %    Monos Abs 0.60 0.00 - 1.00 10*3/uL    Eos % 2.6 %    Eosinophils Abs 0.22 0.00 - 0.50 10*3/uL    Basophil % 0.5 %    Basophil Abs 0.04 0.00 - 0.21 10*3/uL    Immature Grans (Abs) 0.02 0.00 - 0.53  10*3/uL    Immature Grans % 0.2 0.0 - 5.0 %   Estimated GFR  Result Value Ref Range    EGFR Result 108 >=60   EKG 12 Lead With Troponin T Lab Draw   Result Value Ref Range    Ventricular Rate 53 BPM    Atrial Rate 53 BPM    P-R Interval 166 ms    QRS Duration 88 ms    Q-T Interval 488 ms    QTC Calculation (Bazett) 457 ms    P Axis 37 degrees    R Axis 4 degrees    T Axis -9 degrees     I independently reviewed the above laboratory studies and when relevant, compared with previous values if available.    NM Pulmonary Vent + Perfuse Imaging (VQ)    Result Date: 12/12/2022  CLINICAL INDICATION: Shortness of breath TECHNIQUE/FINDINGS: A pulmonary ventilation study was performed using xenon-133 13.0 millicuries. The study demonstrates a normal pattern of washin, equilibrium distribution and washout of the radioactive xenon gas. A pulmonary perfusion study was performed following the intravenous administration of technetium 71m MAA 7.0 millicuries. The study demonstrates a uniform pattern of tracer distribution throughout both lungs. No segmental or subsegmental perfusion abnormalities are detected.     IMPRESSION: Normal ventilation/perfusion lung scan; there is no evidence of pulmonary embolism.          Progress Notes, Medical Decision Making and Critical Care      Elvina Bosch is a 47 y.o. female who presents for chest pain, shortness of breath, and elevated d-dimer. Due to anaphylactic reaction to contrast dye, will obtain a V/Q scan rather than a CT angiogram of the chest. Patient reporting 7/10 chest pain. Has multiple allergies to medication but has previously tolerated Dilaudid. Will administer analgesia.         ED Course as of 12/12/22 2048   Tue Dec 12, 2022   0810 EKG per my interpretation:  Rate 61. Axis normal.  PR, QRS, QTc within normal limits..  No appreciation of ST segment elevation or depression with reciprocal change.   Sinus rhythm. compared to EKG from July 2024 T wave inversion in lead III is  consistent. [KD]   1045 Notified of the patient is having worsening chest pain and nausea, Zofran ordered. [KD]   1045 D Dimer(!):    D-Dimer, Quant 0.71(!)  With the patient's symptoms would be appropriate to order CTA.  Patient had anaphylaxis requiring intubation and ICU stay even with premedication.  At this time the patient best be served at full service hospital.  With critical care services available. [KD]   1059 Heart score 5. [KD]   1113 Patient accepted for ER [facility] for planning of CTA. [KD]   1157 EKG per my interpretation:  Rate 53. Axis normal.  PR, QRS, QTc within normal limits.  No appreciation of ST segment elevation or depression with reciprocal change..  Sinus bradycardia.  Compared to EKG from earlier today no acute change. [KD]   1730 Normal ventilation/perfusion lung scan; there is no evidence of pulmonary embolism. [JA]      ED Course User Index  [JA] Moise Boring Akinlosotu, MD  [KD] Guido Sander, MD       Medications given in the ED:   Medications   ondansetron [facility]) injection 4 mg (4 mg Intravenous Given 12/12/22 1106)   technetium Tc-42m macro aggregated albumin (TC-55M MAA) injection 7 millicurie (7 millicuries Intravenous Given 12/12/22 1645)   xenon Xe-133 10 mCi inhalation 13 millicurie (13 millicuries Inhalation Given 12/12/22 1645)  HYDROmorphone (DILAUDID) 1 mg/mL injection 0.4 mg (0.4 mg Intravenous Given 12/12/22 1729)    , Medications prescribed in the ED:   Discharge Medication List as of 12/12/2022  6:16 PM       , Discharge instructions as provided to patient:   Written and Verbal Discharge Instructions         It is important that you follow up with your primary care doctor or cardiologist within 7 days for further evaluation. Please watch your symptoms closely and call 911 or come back to the ED right away if you have new or worsening symptoms such as increasing pain, pain that does not go away, difficulty breathing, or any  other concerning symptoms.                Clinical Impression and Disposition    Diagnosis:  1. Acute chest pain    2. Elevated d-dimer    3. Shortness of breath      ED Disposition: Discharge - 12/12/2022 17:23  Condition at time of disposition: Stable                 Moise Boring Akinlosotu, MD  12/12/22 2048

## 2022-12-19 ENCOUNTER — Emergency Department
Admission: EM | Admit: 2022-12-19 | Discharge: 2022-12-20 | Disposition: A | Discharge status: Home / Self Care | Payer: No Typology Code available for payment source | Attending: Emergency Medicine | Admitting: Emergency Medicine

## 2022-12-19 ENCOUNTER — Emergency Department: Payer: No Typology Code available for payment source

## 2022-12-19 DIAGNOSIS — I160 Hypertensive urgency: Secondary | ICD-10-CM | POA: Insufficient documentation

## 2022-12-19 DIAGNOSIS — R1030 Lower abdominal pain, unspecified: Secondary | ICD-10-CM | POA: Insufficient documentation

## 2022-12-19 LAB — HIGH SENSITIVITY TROPONIN-I: hs Troponin: 3.6 ng/L (ref <=14.0)

## 2022-12-19 LAB — LAB USE ONLY - CBC WITH DIFFERENTIAL
Absolute Basophils: 0.05 10*3/uL (ref 0.00–0.08)
Absolute Eosinophils: 0.21 10*3/uL (ref 0.00–0.44)
Absolute Immature Granulocytes: 0.02 10*3/uL (ref 0.00–0.07)
Absolute Lymphocytes: 2.32 10*3/uL (ref 0.42–3.22)
Absolute Monocytes: 0.52 10*3/uL (ref 0.21–0.85)
Absolute Neutrophils: 5.18 10*3/uL (ref 1.10–6.33)
Absolute nRBC: 0 10*3/uL (ref <=0.00)
Basophils %: 0.6 %
Eosinophils %: 2.5 %
Hematocrit: 38.3 % (ref 34.7–43.7)
Hemoglobin: 12.6 g/dL (ref 11.4–14.8)
Immature Granulocytes %: 0.2 %
Lymphocytes %: 28 %
MCH: 31.6 pg (ref 25.1–33.5)
MCHC: 32.9 g/dL (ref 31.5–35.8)
MCV: 96 fL (ref 78.0–96.0)
MPV: 12.2 fL (ref 8.9–12.5)
Monocytes %: 6.3 %
Neutrophils %: 62.4 %
Platelet Count: 140 10*3/uL — ABNORMAL LOW (ref 142–346)
Preliminary Absolute Neutrophil Count: 5.18 10*3/uL (ref 1.10–6.33)
RBC: 3.99 10*6/uL (ref 3.90–5.10)
RDW: 13 % (ref 11–15)
WBC: 8.3 10*3/uL (ref 3.10–9.50)
nRBC %: 0 /100{WBCs} (ref <=0.0)

## 2022-12-19 LAB — COMPREHENSIVE METABOLIC PANEL
ALT: 19 U/L (ref 0–55)
AST (SGOT): 21 U/L (ref 5–41)
Albumin/Globulin Ratio: 0.9 (ref 0.9–2.2)
Albumin: 3.6 g/dL (ref 3.5–5.0)
Alkaline Phosphatase: 105 U/L (ref 37–117)
Anion Gap: 10 (ref 5.0–15.0)
BUN: 12 mg/dL (ref 7–21)
Bilirubin, Total: 0.6 mg/dL (ref 0.2–1.2)
CO2: 25 meq/L (ref 17–29)
Calcium: 8.8 mg/dL (ref 8.5–10.5)
Chloride: 110 meq/L (ref 99–111)
Creatinine: 0.9 mg/dL (ref 0.4–1.0)
GFR: >60 mL/min/{1.73_m2} (ref >=60.0)
Globulin: 3.9 g/dL — ABNORMAL HIGH (ref 2.0–3.6)
Glucose: 117 mg/dL — ABNORMAL HIGH (ref 70–100)
Potassium: 3.4 meq/L — ABNORMAL LOW (ref 3.5–5.3)
Protein, Total: 7.5 g/dL (ref 6.0–8.3)
Sodium: 145 meq/L (ref 135–145)

## 2022-12-19 LAB — BETA HCG QUANTITATIVE, PREGNANCY: hCG, Quantitative: 3.6 m[IU]/mL

## 2022-12-19 LAB — STOOL OCCULT BLOOD SINGLE SPECIMEN: Stool Occult Blood Single Specimen: NEGATIVE

## 2022-12-19 MED ORDER — HYDRALAZINE HCL 20 MG/ML IJ SOLN
10.0000 mg | Freq: Once | INTRAMUSCULAR | Status: DC
Start: 2022-12-19 — End: 2022-12-20
  Filled 2022-12-19: qty 1

## 2022-12-19 MED ORDER — DIPHENHYDRAMINE HCL 50 MG/ML IJ SOLN
50.0000 mg | Freq: Once | INTRAMUSCULAR | Status: AC
Start: 2022-12-19 — End: 2022-12-19
  Administered 2022-12-19: 50 mg via INTRAVENOUS

## 2022-12-19 MED ORDER — DIPHENHYDRAMINE HCL 50 MG/ML IJ SOLN
25.0000 mg | Freq: Once | INTRAMUSCULAR | Status: DC
Start: 2022-12-19 — End: 2022-12-19
  Filled 2022-12-19: qty 1

## 2022-12-19 MED ORDER — POTASSIUM CHLORIDE CRYS ER 20 MEQ PO TBCR
40.0000 meq | EXTENDED_RELEASE_TABLET | Freq: Once | ORAL | Status: AC
Start: 2022-12-19 — End: 2022-12-19
  Administered 2022-12-19: 40 meq via ORAL
  Filled 2022-12-19: qty 2

## 2022-12-19 MED ORDER — MORPHINE SULFATE 4 MG/ML IJ/IV SOLN (WRAP)
4.0000 mg | Freq: Once | Status: AC
Start: 2022-12-19 — End: 2022-12-19
  Administered 2022-12-19: 4 mg via INTRAVENOUS
  Filled 2022-12-19: qty 1

## 2022-12-19 MED ORDER — HYDRALAZINE HCL 20 MG/ML IJ SOLN
10.0000 mg | Freq: Once | INTRAMUSCULAR | Status: AC
Start: 2022-12-19 — End: 2022-12-19
  Administered 2022-12-19: 10 mg via INTRAVENOUS
  Filled 2022-12-19: qty 1

## 2022-12-19 MED ORDER — LABETALOL HCL 5 MG/ML IV SOLN (WRAP)
10.0000 mg | Freq: Once | INTRAVENOUS | Status: DC
Start: 2022-12-19 — End: 2022-12-19

## 2022-12-19 NOTE — ED Provider Notes (Signed)
EMERGENCY DEPARTMENT NOTE     Patient initially seen and examined at   ED PHYSICIAN ASSIGNED       Date/Time Event User Comments    12/19/22 2114 Physician Assigned Evonnie Dawes. Evonnie Dawes, DO assigned as Attending     HISTORY OF PRESENT ILLNESS   {Translator Used (Optional):59393}    Chief Complaint: Chest Pain and Abdominal Pain       47 y.o. female with past medical history as below presents with lower abd pain and brbpr when wiping. Denies constipation. Last BM was today and normal. After she noted blood with wiping she began having a brief episode of cp. No active cp at this time. No radiation of cp to back/arms. Abd pain is constant. PSH Hysterectomy Appendectomy Cholecysectomy    Independent Historian (other than patient): {Historian:59024}  Additional History Provided by Independent Historian:  MEDICAL HISTORY     Past Medical History:  Past Medical History:   Diagnosis Date    Hyperlipidemia     Hypertension     Migraine     Morbid obesity     Palpitations     loop recorder placed in 2019, had to be removed after a month due to misplacement and keloid formation per patient    Seizures     Stroke     2015    Thoracic aortic aneurysm     TIA (transient ischemic attack) 2017       Past Surgical History:  Past Surgical History[1]    Social History:  Social History[2]    Family History:  Family History[3]    Outpatient Medication:  Previous Medications    AMLODIPINE (NORVASC) 10 MG TABLET    Take 1 tablet (10 mg) by mouth daily    ASPIRIN EC 325 MG TABLET    Take 1 tablet (325 mg) by mouth daily    ATORVASTATIN (LIPITOR) 80 MG TABLET    Take 1 tablet (80 mg) by mouth daily    HYDROCHLOROTHIAZIDE (HYDRODIURIL) 25 MG TABLET    Take 1 tablet (25 mg) by mouth daily    LEVETIRACETAM (KEPPRA PO)    Take 500 mg by mouth 2 (two) times daily.       LISINOPRIL (ZESTRIL) 20 MG TABLET    Take 1 tablet (20 mg) by mouth daily    PREGABALIN (LYRICA) 50 MG CAPSULE    Take 1 capsule (50 mg) by mouth 3 (three) times  daily         REVIEW OF SYSTEMS   Review of Systems See History of Present Illness  PHYSICAL EXAM     ED Triage Vitals [12/19/22 2110]   Encounter Vitals Group      BP (!) 196/125      Systolic BP Percentile       Diastolic BP Percentile       Heart Rate 60      Resp Rate 20      Temp 98.1 F (36.7 C)      Temp src Oral      SpO2 99 %      Weight 131.5 kg      Height 1.778 m      Head Circumference       Peak Flow       Pain Score 8      Pain Loc       Pain Education       Exclude from Growth Chart      Physical Exam  Nursing note and vitals reviewed.  Constitutional: Well developed, well nourished. Awake & alert. Obese female  Head: Atraumatic. Normocephalic.  Eyes: EOMI. Conjunctivae are pink. Sclerae are anicteric.  ENT: Airway is patent.  Oropharynx is clear and symmetric, without erythema or exudates.  Mucous membranes are moist.    Neck:  No appreciable lymphadenopathy.  Neck is supple without meningismus. No JVD.    Cardiovascular: Regular rate.  Regular rhythm.  No murmurs, rubs, or gallops. Distal pulses are equal and 2+.  Pulmonary/Chest:  No evidence of respiratory distress.  Patient is speaking in full sentences without accessory muscle usage.  Clear to auscultation bilaterally.  No wheezing, rales or rhonchi.  Chest is non-tender.   Abdominal: Soft and non-distended. Exam limited by obese abdomen There is epigastric/suprapubic tenderness.  No rebound, guarding, or rigidity.  Normal bowel sounds. No organomegaly.  No pulsatile mass.    Rectal Small external hemorrhoid nonthrombosed no brbpr No fissure no melena   Extremities: Full range of motion in all extremities.  No peripheral edema. No calf tenderness.  No cyanosis.   Skin:  Skin is warm and dry. No diaphoresis. No rashes.    Neurological: Alert, awake, and appropriate.  No facial asymmetry.  Normal speech.  No acute focal neurological deficits are appreciated.  Normal gait.  Psychiatric: Good eye contact.  Normal affect and behavior.        MEDICAL DECISION MAKING     PRIMARY PROBLEM LIST      {CEP ACUITY:59028} DIAGNOSIS:***  {Chronic Illness Impacting Care of the above problem:59030} {Explain (Optional):59078}  {Differential Diagnosis:59053}    DISCUSSION      Patient reports severe contrast allergy. Ctap performed noncontrast. She has a history of thoracic aneurysm. She has a recent echo with stable thoracic aneurysm do not suspect this as cause of chest pain. No active cp at this time. Trop x2 sent and neg. EKG w/o changes. No arrhythmia noted  Patient noted to be anxious and hypertensive. She reports compliance w/ HTN meds. Ordered hydralazine given HR is 67. Ua r/o uti. Bhcg r/o preg prior to CT      {Was management discussed with a consultant?:59037}  {Was the decision around the need for surgery discussed with consultant:59056::"N/A"}  {External Records Reviewed?:59023}    Additional Notes    {Diagnostic test considered and not performed:59031::"N/A"}  {Prescription medications considered and not given:59033::"N/A"}  {Hospitalization considered but not done:59032::"N/A"}  {Social Determinants of Health Considerations:59036::"N/A"}  {Was there decision to not resuscitate or to de-escalate care due to poor prognosis?:59057}         Vital Signs: Reviewed the patient's vital signs.   Nursing Notes: Reviewed and utilized available nursing notes.  Medical Records Reviewed: Reviewed available past medical records.  Counseling: The emergency provider has spoken with the patient and discussed today's findings, in addition to providing specific details for the plan of care.  Questions are answered and there is agreement with the plan.      CARDIAC STUDIES    The following cardiac studies were independently interpreted by me the Emergency Medicine Provider.  For full cardiac study results please see chart.    {Monitor Strip Interpretation:59688}  {Rate:59685}  {Rhythm:59687}  {ST segments:59689}    EKG 1 interpreted by me (ED provider)  Comparison:  Yes.  No acute changes.  DATE: aug 2024  Time Interpreted: 2115  Rate: 60-100  Rhythm: Normal Sinus Rhythm  ST segments: Nonspecific T wave changes  STEMI?: NO  EKG interpretation: Nonspecific    {  EKG interpretation:59684}  {Comparison:59859}  {Time:64077}  {Rate:59685}  {Rhythm:59687}  {ST segments:59689}  {STEMI?:64073}  {EKG interpretation:59690}    RADIOLOGY IMAGING STUDIES      Chest AP Portable    (Results Pending)   CT Abd/ Pelvis without Contrast    (Results Pending)       EMERGENCY IMAGING STUDIES    The following imagine studies were independently interpreted by me (emergency medicine provider):    {Xray interpreted by ED provider? (Optional):59468} {SIDE (Optional):59475}  {Comparison:59859}  {RESULT:59469}  {IMPRESSION:59470}    {CT interpreted by provider? (Optional):59471}  {Comparison:59859}  {RESULT:59473}  {IMPRESSION:59474}    EMERGENCY DEPT. MEDICATIONS      ED Medication Orders (From admission, onward)      Start Ordered     Status Ordering Provider    12/19/22 2207 12/19/22 2206  labetalol (NORMODYNE,TRANDATE) injection 10 mg  Once        Route: Intravenous  Ordered Dose: 10 mg       Acknowledged Zia Najera Y            LABORATORY RESULTS    Ordered and independently interpreted AVAILABLE laboratory tests.   Results       Procedure Component Value Units Date/Time    Beta HCG Quantitative, Pregnancy [[id number]] Collected: 12/19/22 2154    Specimen: Blood, Venous Updated: 12/19/22 2208    Comprehensive Metabolic Panel [[id number]] Collected: 12/19/22 2154    Specimen: Blood, Venous Updated: 12/19/22 2200    CBC with Differential (Order) [[id number]] Collected: 12/19/22 2154    Specimen: Blood, Venous Updated: 12/19/22 2200    Narrative:      The following orders were created for panel order CBC with Differential (Order).  Procedure                               Abnormality         Status                     ---------                               -----------         ------                      CBC with Differential (C.Marland KitchenMarland Kitchen[[id number]]                      In process                   Please view results for these tests on the individual orders.    CBC with Differential (Component) [[id number]] Collected: 12/19/22 2154    Specimen: Blood, Venous Updated: 12/19/22 2200    High Sensitivity Troponin-I at 0 hrs [[id number]] Collected: 12/19/22 2154    Specimen: Blood, Venous Updated: 12/19/22 2200              CRITICAL CARE/PROCEDURES    Procedures  ***Critical care?  DIAGNOSIS      Diagnosis:  Final diagnoses:   Hypertensive urgency       Disposition:  ED Disposition       None            Prescriptions:  Patient's Medications   New Prescriptions    No medications on file   Previous Medications    AMLODIPINE (  NORVASC) 10 MG TABLET    Take 1 tablet (10 mg) by mouth daily    ASPIRIN EC 325 MG TABLET    Take 1 tablet (325 mg) by mouth daily    ATORVASTATIN (LIPITOR) 80 MG TABLET    Take 1 tablet (80 mg) by mouth daily    HYDROCHLOROTHIAZIDE (HYDRODIURIL) 25 MG TABLET    Take 1 tablet (25 mg) by mouth daily    LEVETIRACETAM (KEPPRA PO)    Take 500 mg by mouth 2 (two) times daily.       LISINOPRIL (ZESTRIL) 20 MG TABLET    Take 1 tablet (20 mg) by mouth daily    PREGABALIN (LYRICA) 50 MG CAPSULE    Take 1 capsule (50 mg) by mouth 3 (three) times daily   Modified Medications    No medications on file   Discontinued Medications    No medications on file           This note was generated by the Epic EMR system/ Dragon speech recognition and may contain inherent errors or omissions not intended by the user. Grammatical errors, random word insertions, deletions and pronoun errors  are occasional consequences of this technology due to software limitations. Not all errors are caught or corrected. If there are questions or concerns about the content of this note or information contained within the body of this dictation they should be addressed directly with the author for clarification.    {THIS REVIEW SECTION WILL  AUTODELETE ONCE NOTE IS SIGNED    END REVIEW SECTION(Optional):55325}             [1]   Past Surgical History:  Procedure Laterality Date    APPENDECTOMY (OPEN)      CHOLECYSTECTOMY      EGD, BIOPSY N/A 04/16/2017    Procedure: EGD, BIOPSY;  Surgeon: Pershing Proud, MD;  Location: [facility];  Service: Gastroenterology;  Laterality: N/A;    HYSTERECTOMY     [2]   Social History  Socioeconomic History    Marital status: Married   Tobacco Use    Smoking status: Never    Smokeless tobacco: Never   Vaping Use    Vaping status: Never Used   Substance and Sexual Activity    Alcohol use: No    Drug use: No     Social Determinants of Health     Financial Resource Strain: Low Risk  (10/31/2022)    Overall Financial Resource Strain (CARDIA)     Difficulty of Paying Living Expenses: Not very hard   Food Insecurity: No Food Insecurity (10/30/2022)    Hunger Vital Sign     Worried About Running Out of Food in the Last Year: Never true     Ran Out of Food in the Last Year: Never true   Transportation Needs: No Transportation Needs (10/31/2022)    PRAPARE - Therapist, art (Medical): No     Lack of Transportation (Non-Medical): No   Physical Activity: Sufficiently Active (09/09/2022)    Exercise Vital Sign     Days of Exercise per Week: 2 days     Minutes of Exercise per Session: 120 min   Recent Concern: Physical Activity - Insufficiently Active (06/30/2022)    Exercise Vital Sign     Days of Exercise per Week: 3 days     Minutes of Exercise per Session: 30 min   Stress: No Stress Concern Present (06/30/2022)    [employer] of  Occupational Health - Occupational Stress Questionnaire     Feeling of Stress : Not at all   Social Connections: Socially Integrated (06/30/2022)    Social Connection and Isolation Panel [NHANES]     Frequency of Communication with Friends and Family: Three times a week     Frequency of Social Gatherings with Friends and Family: Once a week     Attends Religious Services: More  than 4 times per year     Active Member of [employer] or Organizations: Yes     Attends Engineer, structural: More than 4 times per year     Marital Status: Married   Catering manager Violence: Not At Risk (10/30/2022)    Humiliation, Afraid, Rape, and Kick questionnaire     Fear of Current or Ex-Partner: No     Emotionally Abused: No     Physically Abused: No     Sexually Abused: No   Housing Stability: Low Risk  (10/31/2022)    Housing Stability Vital Sign     Unable to Pay for Housing in the Last Year: No     Number of Times Moved in the Last Year: 0     Homeless in the Last Year: No   [3]   Family History  Problem Relation Name Age of Onset    Myocardial Infarction Father  64    Deep vein thrombosis Father      No known problems Mother

## 2022-12-19 NOTE — ED Notes (Signed)
Pt to CT

## 2022-12-19 NOTE — EDIE (Signed)
[facility] NOTIFICATION 12/19/2022 21:04 [name] Cook, [name] Cook DOB: [birth year] MRN: [id number]    [facility]'s patient encounter information:   VWU:[id number]  Account [id number]  Billing Account [id number]      Criteria Met      5 ED Visits in 12 Months    Security and Safety  No Security Events were found.  ED Care Guidelines  There are currently no ED Care Guidelines for this patient. Please check your facility's medical records system.        Prescription Monitoring Program  Narx Score not available at this time.    E.D. Visit Count (12 mo.)  Facility Visits   [facility] 5   [facility] 3   [facility] 2   Total 10   Note: Visits indicate total known visits.     Recent Emergency Department Visit Summary  Date Facility [facility] Type Diagnoses or Chief Complaint    Dec 19, 2022  [place]  [name]  Emergency      chest pain      Dec 12, 2022  [facility] Rhett Bannister  [place]  Emergency      Shortness of breath      Other specified abnormal findings of blood chemistry      Chest pain, unspecified      Pain, Chest      Shortness Of Breath      chesty pains,back pain,dizzy,nausea      Oct 30, 2022  [place] [facility]  [name]  [place]  Emergency      Shortness of Breath      Chest Pain      chest pains, shortness of breath      Oct 08, 2022  [facility] Rhett Bannister  MD  Emergency      Acute bronchitis, unspecified      Pain, Chest      Shortness Of Breath      Chest pain, shortness of breath, and body weakness      Sep 18, 2022  [name] Rhett Bannister  MD  Emergency      Chest pain, unspecified      Pain, Chest      SOB, chest pain,rib pain      Sep 08, 2022  [facility]  [name]  [place]  Emergency      Facial weakness      Extremity Weakness      Numbness      Chest Pain      numbness in face      Jul 19, 2022  [place] [facility]  [place]  Emergency      Facial weakness      Facial Droop       Chest Pain      triage- CP, facial droop x 4 days      triage      Jun 30, 2022  [place] [facility]  [place]    Emergency      Chest pain, unspecified      Chest Pain      Shortness of Breath      triage - Chest Pain      triage      Jun 12, 2022  [name] Rhett Bannister  MD  Emergency      Syncope  and collapse      Chest pain, unspecified      Hypokalemia      Pain, Chest      Light Headed, DIzzy, back pain, Chest pain      Apr 04, 2022  [facility] Rhett Bannister  MD  Emergency      Other chest pain      Essential (primary) hypertension      Chest pain, unspecified      Pain, Rib      Chest Pain,SOB,Left Side Body Pain        Recent Inpatient Visit Summary  Date Facility [facility] Type Diagnoses or Chief Complaint    Oct 30, 2022  [place] [facility]  [name]  [place]  Medical Surgical      Facial weakness      Nontraumatic intracerebral hemorrhage, unspecified        Care Team  No Care Team was found.  [facility]  This patient has registered at the [facility] Emergency Department  For more information visit: [email or web address]     PLEASE NOTE:     1.   Any care recommendations and other clinical information are provided as guidelines or for historical purposes only, and providers should exercise their own clinical judgment when providing care.    2.   You may only use this information for purposes of treatment, payment or health care operations activities, and subject to the limitations of applicable [facility] Policies.    3.   You should consult directly with the organization that provided a care guideline or other clinical history with any questions about additional information or accuracy or completeness of information provided.    ? 2024 [facility] - [email or web address]

## 2022-12-20 LAB — ECG 12-LEAD
Atrial Rate: 60 {beats}/min
IHS MUSE NARRATIVE AND IMPRESSION: NORMAL
P Axis: 53 degrees
P-R Interval: 134 ms
Q-T Interval: 438 ms
QRS Duration: 88 ms
QTC Calculation (Bezet): 438 ms
R Axis: 24 degrees
T Axis: 13 degrees
Ventricular Rate: 60 {beats}/min

## 2022-12-20 LAB — HIGH SENSITIVITY TROPONIN-I WITH DELTA
hs Troponin-I Delta: 2
hs Troponin: 5.2 ng/L (ref <=14.0)

## 2023-01-05 ENCOUNTER — Emergency Department: Payer: No Typology Code available for payment source

## 2023-01-05 ENCOUNTER — Observation Stay: Payer: No Typology Code available for payment source

## 2023-01-05 ENCOUNTER — Observation Stay
Admission: EM | Admit: 2023-01-05 | Discharge: 2023-01-06 | Disposition: A | Discharge status: Home / Self Care | Payer: No Typology Code available for payment source | Attending: Student in an Organized Health Care Education/Training Program | Admitting: Student in an Organized Health Care Education/Training Program

## 2023-01-05 DIAGNOSIS — R7303 Prediabetes: Secondary | ICD-10-CM | POA: Insufficient documentation

## 2023-01-05 DIAGNOSIS — R0600 Dyspnea, unspecified: Secondary | ICD-10-CM | POA: Insufficient documentation

## 2023-01-05 DIAGNOSIS — I160 Hypertensive urgency: Secondary | ICD-10-CM | POA: Insufficient documentation

## 2023-01-05 DIAGNOSIS — E785 Hyperlipidemia, unspecified: Secondary | ICD-10-CM | POA: Insufficient documentation

## 2023-01-05 DIAGNOSIS — R29810 Facial weakness: Principal | ICD-10-CM | POA: Insufficient documentation

## 2023-01-05 DIAGNOSIS — Z8673 Personal history of transient ischemic attack (TIA), and cerebral infarction without residual deficits: Secondary | ICD-10-CM | POA: Insufficient documentation

## 2023-01-05 DIAGNOSIS — E66813 Obesity, class 3: Secondary | ICD-10-CM | POA: Insufficient documentation

## 2023-01-05 DIAGNOSIS — I77810 Thoracic aortic ectasia: Secondary | ICD-10-CM | POA: Insufficient documentation

## 2023-01-05 DIAGNOSIS — R531 Weakness: Secondary | ICD-10-CM | POA: Insufficient documentation

## 2023-01-05 DIAGNOSIS — R202 Paresthesia of skin: Secondary | ICD-10-CM | POA: Insufficient documentation

## 2023-01-05 DIAGNOSIS — R110 Nausea: Secondary | ICD-10-CM | POA: Insufficient documentation

## 2023-01-05 DIAGNOSIS — I10 Essential (primary) hypertension: Secondary | ICD-10-CM | POA: Insufficient documentation

## 2023-01-05 DIAGNOSIS — R0789 Other chest pain: Secondary | ICD-10-CM | POA: Insufficient documentation

## 2023-01-05 DIAGNOSIS — D72829 Elevated white blood cell count, unspecified: Secondary | ICD-10-CM | POA: Insufficient documentation

## 2023-01-05 DIAGNOSIS — Z91041 Radiographic dye allergy status: Secondary | ICD-10-CM | POA: Insufficient documentation

## 2023-01-05 DIAGNOSIS — Z6841 Body Mass Index (BMI) 40.0 and over, adult: Secondary | ICD-10-CM | POA: Insufficient documentation

## 2023-01-05 DIAGNOSIS — G40909 Epilepsy, unspecified, not intractable, without status epilepticus: Secondary | ICD-10-CM | POA: Insufficient documentation

## 2023-01-05 DIAGNOSIS — R2990 Unspecified symptoms and signs involving the nervous system: Secondary | ICD-10-CM

## 2023-01-05 LAB — LAB USE ONLY - CBC WITH DIFFERENTIAL
Absolute Basophils: 0.05 10*3/uL (ref 0.00–0.08)
Absolute Eosinophils: 0.29 10*3/uL (ref 0.00–0.44)
Absolute Immature Granulocytes: 0.03 10*3/uL (ref 0.00–0.07)
Absolute Lymphocytes: 2.89 10*3/uL (ref 0.42–3.22)
Absolute Monocytes: 0.71 10*3/uL (ref 0.21–0.85)
Absolute Neutrophils: 6.27 10*3/uL (ref 1.10–6.33)
Absolute nRBC: 0 10*3/uL (ref <=0.00)
Basophils %: 0.5 %
Eosinophils %: 2.8 %
Hematocrit: 40.3 % (ref 34.7–43.7)
Hemoglobin: 13.6 g/dL (ref 11.4–14.8)
Immature Granulocytes %: 0.3 %
Lymphocytes %: 28.2 %
MCH: 31.9 pg (ref 25.1–33.5)
MCHC: 33.7 g/dL (ref 31.5–35.8)
MCV: 94.4 fL (ref 78.0–96.0)
MPV: 13.1 fL — ABNORMAL HIGH (ref 8.9–12.5)
Monocytes %: 6.9 %
Neutrophils %: 61.3 %
Platelet Count: 160 10*3/uL (ref 142–346)
Preliminary Absolute Neutrophil Count: 6.27 10*3/uL (ref 1.10–6.33)
RBC: 4.27 10*6/uL (ref 3.90–5.10)
RDW: 13 % (ref 11–15)
WBC: 10.24 10*3/uL — ABNORMAL HIGH (ref 3.10–9.50)
nRBC %: 0 /100{WBCs} (ref <=0.0)

## 2023-01-05 LAB — COMPREHENSIVE METABOLIC PANEL
ALT: 37 U/L (ref 0–55)
AST (SGOT): 25 U/L (ref 5–41)
Albumin/Globulin Ratio: 0.9 (ref 0.9–2.2)
Albumin: 3.7 g/dL (ref 3.5–5.0)
Alkaline Phosphatase: 97 U/L (ref 37–117)
Anion Gap: 10 (ref 5.0–15.0)
BUN: 15 mg/dL (ref 7–21)
Bilirubin, Total: 0.6 mg/dL (ref 0.2–1.2)
CO2: 22 meq/L (ref 17–29)
Calcium: 9 mg/dL (ref 8.5–10.5)
Chloride: 111 meq/L (ref 99–111)
Creatinine: 0.9 mg/dL (ref 0.4–1.0)
GFR: >60 mL/min/{1.73_m2} (ref >=60.0)
Globulin: 3.9 g/dL — ABNORMAL HIGH (ref 2.0–3.6)
Glucose: 99 mg/dL (ref 70–100)
Potassium: 3.6 meq/L (ref 3.5–5.3)
Protein, Total: 7.6 g/dL (ref 6.0–8.3)
Sodium: 143 meq/L (ref 135–145)

## 2023-01-05 LAB — URINALYSIS WITH REFLEX TO MICROSCOPIC EXAM IF INDICATED
Urine Bilirubin: NEGATIVE
Urine Blood: NEGATIVE
Urine Glucose: NEGATIVE
Urine Ketones: NEGATIVE mg/dL
Urine Nitrite: NEGATIVE
Urine Specific Gravity: 1.018 (ref 1.001–1.035)
Urine Urobilinogen: NORMAL mg/dL (ref 0.2–2.0)
Urine pH: 6.5 (ref 5.0–8.0)

## 2023-01-05 LAB — HIGH SENSITIVITY TROPONIN-I: hs Troponin: 2.7 ng/L (ref <=14.0)

## 2023-01-05 LAB — PT/INR
INR: 1 (ref 0.9–1.1)
PT: 11.8 s (ref 10.1–12.9)

## 2023-01-05 LAB — APTT: PTT: 34 s (ref 27–39)

## 2023-01-05 LAB — SERUM HCG, QUALITATIVE: hCG Qualitative: NEGATIVE

## 2023-01-05 LAB — WHOLE BLOOD GLUCOSE POCT: Whole Blood Glucose POCT: 94 mg/dL (ref 70–100)

## 2023-01-05 MED ORDER — ONDANSETRON 4 MG PO TBDP
4.0000 mg | ORAL_TABLET | Freq: Four times a day (QID) | ORAL | Status: DC | PRN
Start: 2023-01-05 — End: 2023-01-06

## 2023-01-05 MED ORDER — LEVETIRACETAM 250 MG PO TABS
500.0000 mg | ORAL_TABLET | Freq: Two times a day (BID) | ORAL | Status: DC
Start: 2023-01-05 — End: 2023-01-06
  Administered 2023-01-05 – 2023-01-06 (×2): 500 mg via ORAL
  Filled 2023-01-05 (×2): qty 2

## 2023-01-05 MED ORDER — ASPIRIN 325 MG PO TBEC
325.0000 mg | DELAYED_RELEASE_TABLET | Freq: Every day | ORAL | Status: DC
Start: 2023-01-06 — End: 2023-01-06
  Administered 2023-01-06: 325 mg via ORAL
  Filled 2023-01-05: qty 1

## 2023-01-05 MED ORDER — HYDROMORPHONE HCL 1 MG/ML IJ SOLN
0.5000 mg | Freq: Once | INTRAMUSCULAR | Status: AC
Start: 2023-01-05 — End: 2023-01-05
  Administered 2023-01-05: 0.5 mg via INTRAVENOUS
  Filled 2023-01-05: qty 1

## 2023-01-05 MED ORDER — HYDRALAZINE HCL 20 MG/ML IJ SOLN
10.0000 mg | Freq: Once | INTRAMUSCULAR | Status: AC
Start: 2023-01-05 — End: 2023-01-05
  Administered 2023-01-05: 10 mg via INTRAVENOUS
  Filled 2023-01-05: qty 1

## 2023-01-05 MED ORDER — METHYLPREDNISOLONE SODIUM SUCC 125 MG IJ SOLR (WRAP)
125.0000 mg | Freq: Once | INTRAMUSCULAR | Status: DC
Start: 2023-01-05 — End: 2023-01-05

## 2023-01-05 MED ORDER — HEPARIN SODIUM (PORCINE) 5000 UNIT/ML IJ SOLN
5000.0000 [IU] | Freq: Three times a day (TID) | INTRAMUSCULAR | Status: DC
Start: 2023-01-05 — End: 2023-01-06
  Administered 2023-01-05 – 2023-01-06 (×2): 5000 [IU] via SUBCUTANEOUS
  Filled 2023-01-05: qty 1

## 2023-01-05 MED ORDER — LORAZEPAM 2 MG/ML IJ SOLN
2.0000 mg | Freq: Once | INTRAMUSCULAR | Status: AC | PRN
Start: 2023-01-05 — End: 2023-01-05
  Administered 2023-01-05: 2 mg via INTRAVENOUS
  Filled 2023-01-05: qty 1

## 2023-01-05 MED ORDER — DIPHENHYDRAMINE HCL 50 MG/ML IJ SOLN
25.0000 mg | Freq: Once | INTRAMUSCULAR | Status: DC
Start: 2023-01-05 — End: 2023-01-05

## 2023-01-05 MED ORDER — ONDANSETRON HCL 4 MG/2ML IJ SOLN
4.0000 mg | Freq: Four times a day (QID) | INTRAMUSCULAR | Status: DC | PRN
Start: 2023-01-05 — End: 2023-01-06

## 2023-01-05 MED ORDER — ASPIRIN 81 MG PO CHEW
81.0000 mg | CHEWABLE_TABLET | Freq: Once | ORAL | Status: AC
Start: 2023-01-05 — End: 2023-01-05
  Administered 2023-01-05: 81 mg via ORAL
  Filled 2023-01-05: qty 1

## 2023-01-05 MED ORDER — ATORVASTATIN CALCIUM 40 MG PO TABS
80.0000 mg | ORAL_TABLET | Freq: Every day | ORAL | Status: DC
Start: 2023-01-06 — End: 2023-01-06
  Administered 2023-01-06: 80 mg via ORAL
  Filled 2023-01-05: qty 2

## 2023-01-05 NOTE — ED to IP RN Note (Signed)
[facility] EMERGENCY DEPARTMENT  ED NURSING NOTE FOR THE RECEIVING INPATIENT NURSE   ED NURSE Phineas Real    ED CHARGE RN    ADMISSION INFORMATION   [name] Cook is a 47 y.o. female admitted with an ED diagnosis of:    No diagnosis found.     Isolation: None    Allergies: Contrast [iodinated contrast media], Fentanyl, Fioricet [butalbital-apap-caffeine], Flexeril [cyclobenzaprine], Motrin [ibuprofen], Tramadol, Bentyl [dicyclomine], Percocet [oxycodone-acetaminophen], Shellfish-derived products, Toradol [ketorolac tromethamine], and Tylenol [acetaminophen]   Holding Orders confirmed? N/A   Belongings Documented? N/A   Home medications sent to pharmacy confirmed? N/A   NURSING CARE   Patient Comes From:   Mental Status: Home Independent  alert and oriented   ADL: Independent with all ADLs   Ambulation: no difficulty   Pertinent Information  and Safety Concerns:     Broset Violence Risk Level: Low Pt stroke alert for onset R facial droop, blurry vision, chest pain  and dizziness that started earlier today. NIH 2. Pt states this has happened in the past with no definitive cause. Pt is alert, oriented.  Independent. Can ambulate with steady gait      CT / NIH   CT Head ordered on this patient?  Yes   NIH/Dysphagia assessment done prior to admission? Yes   VITAL SIGNS (at the time of this note)      Vitals:    01/05/23 2130   BP: 153/60   Pulse: 71   Resp: 14   Temp:    SpO2: 98%

## 2023-01-05 NOTE — Consults (Signed)
Telestroke Acute Stroke Alert Note (Telephone-only Assessment)       We were consulted for stroke evaluation by Hedy Camara, DO who is caring for the patient at [facility] and case was discussed briefly.        Telephone Call Time  22:05     History of Present Illness:  Ms. [name] is a 47 y.o. female with PMH HLD, HTN who presents with right facial droop.  Her last known well time was 15:00 on 10/25 (acute onset). She came to the hospital for the same. ED evaluation shows isolated R facial droop with NIHSS 2-3 for droop and perhaps mild dysarthria.     History is notable for recurrent R facial droop as isolated neuro symptom - in our system, I note the following previous presentations (though chart review references similar presentations since 2015):  - 08/19/2015 - R facial weakness - negative work up including MRI w/wo contrast.   - 03/02/2016 - Presented to [facility] for 2d of R face and leg weakness. Note makes references to multiple admissions since 09/2013 in [place] with similar symptoms, negative MRI at this time  - 05/12/2016 - Presented with R sided facial droop and RUE/RLE weakness/numbness. MRI negative  - 04/17/2017 - Presented with R facial droop though thought to be inconsistent with true facial asymmetry. MRI negative, thought to be hemifacial spasm versus conversion disorder.  - 09/16/2017 - Presented with R sided weakness/numbness x several days; MRI not able to be done as she could not fit into MRI scanner and was placed on ASA + Plavix for stroke prevention  - 03/04/2018 - R sided weakness/ paresthesias, not able to undergo MRI due to nose-ring - noted possible migraine event  - 09/05/2018 - Presented with R sided weakness, MRI negative  - 10/30/2018 - Presented with subjective R facial weakness and some facial twitching. MRI negative. Neuro consultation noted that given extensive workup in past with no findings, concern suspicion for conversion disorder, advised to follow up with  outpatient neurologist.   - 01/28/2019 - Presented with R facial droop. Given multitude of prior presentations with similar symptoms and exam concerning for functional neurological disorder, repeat workup not recommended.   - 09/14/2020 - Presented with L facial droop, R sided symptoms, MRI negative.   - 03/12/2021 - Presented with R facial droop, R body weakness + numbness, chest pain. MRI brain negative.   - 07/29/2021 - [facility] for R facial droop, RUE/RLE weakness and numbness. Underwent workup including MRI (negative for infarct). Note of effort-dependent weakness on R side and subjective sensory loss of R face/arm/leg splitting midline, thought most likely to be either possible status migrainosis w/ recrudescence of prior stroke vs functional R facial weakness.   - 07/19/2022 - presented to [facility] with facial droop x4d and chest pain, MRI negative for infarct  - 09/09/2022 - presented to [facility] with chest pain and isolated R facial droop. While admitted to hospital awaiting imaging, developed R arm weakness. Underwent MRI which again showed no infarct  - 10/31/2022 - presented with R facial droop, L sided headache, chest pain. Recommended to undergo MRI brain w/wo contrast however patient noted contrast allergy and did not receive contrast. During this admission, patient was treated for a possible Bell's palsy with steroids and valacyclovir. She underwent lumbar puncture which was bland (WBC 1/0, RBC 1/1, protein 26, glucose 103, culture and meningitis/encephalitis panel negative, flow cytometry and cytology essentially acellular).  Initial Vital Signs:        BP:(!) 191/125 (01/05/23 2051)       Pulse:74 (01/05/23 2051)        Resp:20 (01/05/23 2051)       Temp:98.5 F (36.9 C) (01/05/23 2051)       SpO2:96 % (01/05/23 2051)    Imaging personally reviewed? No, only radiology report was reviewed    Impression/recommendations  Given the repeated symptoms and presentations  over the course of the last ~9 years as outlined above, I have low suspicion that this patient's R facial weakness today is due to cerebrovascular etiology. She has undergone significant workup including multiple bouts of advanced imaging with no etiology. Most recently, she has undergone MRI and lumbar puncture, with results being very bland. On my review of her chart, my only additional thought is that she has not had contrasted imaging of her brain / CN (just single MRI brain w/wo contrast in 2017 that I am able to see) and it may be possible (though I consider it unlikely) that there is a neuroinflammatory component that we have not been able to pick up due to non-contrasted scans. As such I do think if the patient is willing I would consider MRI brain w/wo contrast with thin slices through brainstem to better evaluate for CN inflammation (at this time, this is her single symptom). She declined MR contrast in August however I see she has tolerated MR contrast multiple times in the past. 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 Patient would likely benefit from close outpatient neurological follow-up with a trusted neurologist.     Patient is not a candidate for TNK given she is out of the window and high suspicion for non-cerebrovascular etiology, and is not a candidate for thrombectomy given no exam findings c/f LVO.     - MRI Brain W/WO Contrast with thin cuts through brainstem as above if patient agreeable  - Neurology consult (if needed)  - Rest of care per primary    Plan for transfer to [facility]: no    Thank you for allowing [place] to participate in the care of [name] Cook.  Case was also discussed with Dr. Claudette Laws, Aundra Millet, DO. Please contact us with any questions at any time.    Delana Meyer, MD PhD, 01/05/2023  10:13 PM  Vascular Neurology, [facility]    Time on encounter (including reviewing chart, reviewing images, reviewing labs, discussing case w/ outside hospital , documentation): 45 minutes

## 2023-01-05 NOTE — EDIE (Signed)
[facility] NOTIFICATION 01/05/2023 20:40 [name] Cook, [name] Cook DOB: 05/22/1975 MRN: [id number]    [facility]'s patient encounter information:   QIO:?[id number]  Account [id number]  Billing Account [id number]      Criteria Met      5 ED Visits in 12 Months    Security and Safety  No Security Events were found.  ED Care Guidelines  There are currently no ED Care Guidelines for this patient. Please check your facility's medical records system.        Prescription Monitoring Program  Narx Score not available at this time.    E.D. Visit Count (12 mo.)  Facility Visits   [name] 5   [facility] 4   [facility] 2   Total 11   Note: Visits indicate total known visits.     Recent Emergency Department Visit Summary  Showing 10 most recent visits out of 11 in the past 12 months   Date Facility [facility] Type Diagnoses or Chief Complaint    Jan 05, 2023  [facility]  [name]  Emergency      R Facial Droop, Chest Pain      Dec 19, 2022  [facility]  [name]  [place]  Emergency      Lower abdominal pain, unspecified      Hypertensive urgency      Abdominal Pain      chest pain      Dec 12, 2022  [facility] Rhett Bannister  MD  Emergency      Shortness of breath      Other specified abnormal findings of blood chemistry      Chest pain, unspecified      Pain, Chest      Shortness Of Breath      chesty pains,back pain,dizzy,nausea      Oct 30, 2022  [facility]  [name]  [place]  Emergency      Shortness of Breath      Chest Pain      chest pains, shortness of breath      Oct 08, 2022  [facility] Rhett Bannister  MD  Emergency      Acute bronchitis, unspecified      Pain, Chest      Shortness Of Breath      Chest pain, shortness of breath, and body weakness      Sep 18, 2022  [name] Rhett Bannister  MD  Emergency      Chest pain, unspecified      Pain, Chest      SOB, chest pain,rib pain      Sep 08, 2022   [facility]  [name]  [place]  Emergency      Facial weakness      Extremity Weakness      Numbness      Chest Pain      numbness in face      Jul 19, 2022  [facility]  [place]    Emergency      Facial weakness      Facial Droop      Chest Pain      triage- CP, facial droop x 4 days      triage      Jun 30, 2022  [facility]  [place]  [place]  Emergency      Chest pain, unspecified      Chest Pain      Shortness of Breath      triage - Chest Pain      triage      Jun 12, 2022  [facility] Rhett Bannister  MD  Emergency      Syncope and collapse      Chest pain, unspecified      Hypokalemia      Pain, Chest      [place], DIzzy, back pain, Chest pain        Recent Inpatient Visit Summary  Date Facility [facility] Type Diagnoses or Chief Complaint    Oct 30, 2022  [facility]  [name]  [place]  Medical Surgical      Facial weakness      Nontraumatic intracerebral hemorrhage, unspecified        Care Team  No Care Team was found.  [facility]  This patient has registered at the [facility] Emergency Department  For more information visit: [email or web address]     PLEASE NOTE:     1.   Any care recommendations and other clinical information are provided as guidelines or for historical purposes only, and providers should exercise their own clinical judgment when providing care.    2.   You may only use this information for purposes of treatment, payment or health care operations activities, and subject to the limitations of applicable [facility] Policies.    3.   You should consult directly with the organization that provided a care guideline or other clinical history with any questions about additional information or accuracy or completeness of information provided.    ? 2024 [facility] - [email or web address]

## 2023-01-05 NOTE — ED Notes (Signed)
Stroke alert called.

## 2023-01-05 NOTE — H&P (Signed)
ADMISSION HISTORY AND PHYSICAL EXAM    [facility], DIVISION OF HOSPITALIST MEDICINE   [facility]   [place] Pager: [phone]      Date Time: 01/05/23 10:54 PM  Patient Name: [name] Cook  Attending Physician: Marya Amsler, MD  Primary Care Physician: Unknown Foley, NP    CC:   Recurrent facial droop, Right    History Gathered From: Patient and ED physician.  I also reviewed multiple hospitalization/neurology notes and echo report    Active Hospital Problems    Diagnosis    Facial droop       Patient has BMI=Body mass index is 50.3 kg/m.  Diagnosis: Obesity Class 3 (formerly known as Morbid Obesity) based on BMI criteria        Assessment and Plan :     [name] Cook is a 47 y.o. 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  -I personally reviewed chest x-ray, negative for acute abnormality.-  -I personally reviewed EKG, NSR without ST elevation.  -Patient states that she had MRI in past with contrast, developed respiratory failure intubated despite premedicated for contrast imaging.  -Neurology consulted by ED physician, recommended MRI with contrast, however holding for above-mentioned reason.  -Will give aspirin x 1  -Initiating stroke workup protocol with  labs, imaging, dysphagia screening and other stroke workup.  -Fall precaution  -Placing PT/OT consult  -Patient received hydralazine in ED, will allow permissive hypertension.  -Telemetry monitoring.  -Zofran IV as needed for nausea and vomiting  -Will send COVID/influenza  -Will trend troponin  -Will continue home medications which patient provided during evaluation while holding antihypertensives.  -Lifestyle modification with diet and exercise       VTE Prophylaxis-SCD/Heparin    Nutrition:Cardiac      Code status: Full code     Status/Disposition:   Pt is admitted under observation.  Anticipated medical stability for discharge: 24-48-hour      History of Presenting Illnes     [name] Cook is a 47 y.o. 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    Patient states that she noticed recurrent right facial droop around 3 PM associated with decreased right-sided sensation and right leg weakness.  Patient further reports nausea, shortness of breath and chest discomfort.  Patient states that she had MRI in past with contrast, developed respiratory failure intubated despite premedicated for contrast imaging.    Patient denies speech/swallow problem, chest pain, palpitation, cough, vomiting, fever, chills, change in urinary/bowel habit.    Patient denies smoking cigarettes, drinking alcohol or using illicit drugs.    Past Medical  Histor     Past Medical History:   Diagnosis Date    Hyperlipidemia     Hypertension     Migraine     Morbid obesity     Palpitations     loop recorder placed in 2019, had to be removed after a month due to misplacement and keloid formation per patient    Seizures     Stroke     2015    Thoracic aortic aneurysm     TIA (transient ischemic attack)  2017      has a past medical history of Hyperlipidemia, Hypertension, Migraine, Morbid obesity, Palpitations, Seizures, Stroke, Thoracic aortic aneurysm, and TIA (transient ischemic attack) (2017).    Available old records reviewed, including: EPIC     Past Surgical History:    has a past surgical history that includes APPENDECTOMY (OPEN); Cholecystectomy; EGD, BIOPSY (N/A, 04/16/2017); and Hysterectomy.      Family History:   family history includes Deep vein thrombosis in her father; Myocardial Infarction (age of onset: 27) in her father; No known problems in her mother.    Social History:    reports that she has never smoked. She has never used smokeless tobacco. She reports that she does not drink alcohol and does not use drugs.    Allergies:   Allergies[1]    Medications:     Home Medications       Med List Status: Complete Set By: Gilmore Laroche, RN at 01/05/2023 10:54 PM              amLODIPine (NORVASC) 10 MG tablet     Take 1 tablet (10 mg) by mouth daily     aspirin EC 325 MG tablet     Take 1 tablet (325 mg) by mouth daily     atorvastatin (LIPITOR) 80 MG tablet     Take 1 tablet (80 mg) by mouth daily     hydroCHLOROthiazide (HYDRODIURIL) 25 MG tablet     Take 1 tablet (25 mg) by mouth daily     LevETIRAcetam (KEPPRA PO)     Take 500 mg by mouth 2 (two) times daily.        lisinopril (ZESTRIL) 20 MG tablet     Take 1 tablet (20 mg) by mouth daily          Flagged for Removal               pregabalin (LYRICA) 50 MG capsule     Take 1 capsule (50 mg) by mouth 3 (three) times daily            Reviewed Medications from Dispense report: [x] YES / [] NO   Confirmed Medications with patient/family:     [x] YES / [] NO       Review of Systems:   All other systems were reviewed and are negative except:as above in HPI     Physical Exam:   Patient Vitals for the past 24 hrs:   BP Temp Temp src Pulse Resp SpO2 Weight   01/05/23 2130 153/60 -- -- 71 14 98 % --   01/05/23 2051 (!) 191/125 98.5 F (36.9 C) Oral 74 20  96 % (!) 159 kg (350 lb 8.5 oz)     Body mass index is 50.3 kg/m.  No intake or output data in the 24 hours ending 01/05/23 2254    General: Morbidly obese, NAD   HEENT: [place], EOMI. Oropharynx clear without lesions, mucous membranes moist  Neck: Supple  Cardiovascular: Normal S1 and S2, no murmurs, rubs or gallops  Lungs: Clear to auscultation bilaterally, without wheezing, rhonchi, or rales  Abdomen: Soft, non-tender, normoactive bowel sounds, no rebound or guarding  Extremities:Pulses palpable   Neuro: aAOx 3. + facial droop. 4/5 strength in RLE. Decrease gross touch sensation in right face, RUE/RLE.   Skin: No rashes or lesions noted    Labs:     Results       Procedure Component Value Units Date/Time    Urinalysis with Reflex to Microscopic Exam [[id number]]  (Abnormal) Collected: 01/05/23 2231    Specimen: Urine, Clean Catch Updated: 01/05/23 2240     Urine Color Straw     Urine Clarity Hazy     Urine Specific Gravity 1.018     Urine pH 6.5     Urine Leukocyte Esterase Trace     Urine Nitrite Negative     Urine Protein 10= Trace     Urine Glucose Negative     Urine Ketones Negative mg/dL      Urine Urobilinogen Normal mg/dL      Urine Bilirubin Negative     Urine Blood Negative     RBC, UA 3-5 /hpf      Urine WBC 6-10 /hpf      Urine Squamous Epithelial Cells 6-10 /hpf     Urine [employer] Tube [[id number]] Collected: 01/05/23 2231    Specimen: Urine, Clean Catch Updated: 01/05/23 2234    Comprehensive Metabolic Panel [[id number]]  (Abnormal) Collected: 01/05/23 2125    Specimen: Blood, Venous Updated: 01/05/23 2155     Glucose 99 mg/dL      BUN 15 mg/dL      Creatinine 0.9 mg/dL      Sodium 244 mEq/L      Potassium 3.6 mEq/L      Chloride 111 mEq/L      CO2 22 mEq/L      Calcium 9.0 mg/dL      Anion Gap 01.0     GFR >60.0 mL/min/1.73 m2      AST (SGOT) 25 U/L      ALT 37 U/L      Alkaline Phosphatase 97 U/L      Albumin 3.7 g/dL      Protein, Total 7.6 g/dL      Globulin 3.9 g/dL      Albumin/Globulin  Ratio 0.9     Bilirubin, Total 0.6 mg/dL     CBC with Differential (Order) [[id number]]  (Abnormal) Collected: 01/05/23 2125    Specimen: Blood, Venous Updated: 01/05/23 2144    Narrative:      The following orders were created for panel order CBC with Differential (Order).  Procedure                               Abnormality         Status                     ---------                               -----------         ------                     CBC with Differential (C.Marland KitchenMarland Kitchen[[id number]]  Abnormal  Final result                 Please view results for these tests on the individual orders.    CBC with Differential (Component) [[id number]]  (Abnormal) Collected: 01/05/23 2125    Specimen: Blood, Venous Updated: 01/05/23 2144     WBC 10.24 x10 3/uL      Hemoglobin 13.6 g/dL      Hematocrit 59.5 %      Platelet Count 160 x10 3/uL      MPV 13.1 fL      RBC 4.27 x10 6/uL      MCV 94.4 fL      MCH 31.9 pg      MCHC 33.7 g/dL      RDW 13 %      nRBC % 0.0 /100 WBC      Absolute nRBC 0.00 x10 3/uL      Preliminary Absolute Neutrophil Count 6.27 x10 3/uL      Neutrophils % 61.3 %      Lymphocytes % 28.2 %      Monocytes % 6.9 %      Eosinophils % 2.8 %      Basophils % 0.5 %      Immature Granulocytes % 0.3 %      Absolute Neutrophils 6.27 x10 3/uL      Absolute Lymphocytes 2.89 x10 3/uL      Absolute Monocytes 0.71 x10 3/uL      Absolute Eosinophils 0.29 x10 3/uL      Absolute Basophils 0.05 x10 3/uL      Absolute Immature Granulocytes 0.03 x10 3/uL     High Sensitivity Troponin-I [[id number]]  (Normal) Collected: 01/05/23 2107    Specimen: Blood, Venous Updated: 01/05/23 2137     hs Troponin 2.7 ng/L     Serum Beta HCG Qualitative [[id number]]  (Normal) Collected: 01/05/23 2107    Specimen: Blood, Venous Updated: 01/05/23 2123     hCG Qualitative Negative    PT/INR [[id number]]  (Normal) Collected: 01/05/23 2107    Specimen: Blood, Venous Updated: 01/05/23 2123     PT 11.8 sec      INR 1.0    APTT [[id number]]  (Normal)  Collected: 01/05/23 2107    Specimen: Blood, Venous Updated: 01/05/23 2123     PTT 34 sec     Whole Blood Glucose POCT [[id number]]  (Normal) Collected: 01/05/23 2045    Specimen: Blood, Capillary Updated: 01/05/23 2046     Whole Blood Glucose POCT 94 mg/dL              EKG:  As above    Imaging personally reviewed, including: all available   Chest AP Portable    Result Date: 01/05/2023  1. No acute cardiopulmonary process J. Carole Binning, MD 01/05/2023 9:46 PM    CT Head WO Contrast    Result Date: 01/05/2023   1.No intracranial abnormality is seen. Theodoro Doing, MD 01/05/2023 9:03 PM         This note was generated by the Epic EMR system/ Dragon speech recognition and may contain inherent errors or omissions not intended by the user. Grammatical errors, random word insertions, deletions and pronoun errors  are occasional consequences of this technology due to software limitations. Not all errors are caught or corrected. If there are questions or concerns about the content of this note or information contained within the body of this dictation they should be addressed directly with the author for clarification.  Signed by: Marya Amsler, MD, MD   FA:[id number], Stanton Kidney, NP    Time Elapsed: 75 min, spent in patient's interview, patient examination, review and discussion of imaging, counseling, coordination of patient care, review and completion of medical records.    This note was generated by the Epic EMR system/ Dragon speech recognition and may contain inherent errors or omissions not intended by the user.         [1]   Allergies  Allergen Reactions    Contrast [Iodinated Contrast Media] Anaphylaxis     Patient woke up in ICU after having CT with contrast, last remembers being in CT    Fentanyl Anaphylaxis     Throat swelling    Fioricet [Butalbital-Apap-Caffeine] Hives    Flexeril [Cyclobenzaprine] Edema     Causes throat swelling per Pt report    Motrin [Ibuprofen] Anaphylaxis     Throat swelling    Tramadol  Anaphylaxis     Throat swelling    Bentyl [Dicyclomine] Edema    Percocet [Oxycodone-Acetaminophen] Hives    Shellfish-Derived Products Hives    Toradol [Ketorolac Tromethamine] Hives    Tylenol [Acetaminophen] Hives

## 2023-01-05 NOTE — ED Provider Notes (Signed)
 EMERGENCY DEPARTMENT NOTE     Patient initially seen and examined at   ED PHYSICIAN ASSIGNED       Date/Time Event User Comments    01/05/23 2048 Physician Assigned , Benny Lennert, DO assigned as Attending           ED MIDLEVEL (APP) ASSIGNED       None            HISTORY OF PRESENT ILLNESS       Chief Complaint: No chief complaint on file.       47 y.o. female with past medical history as below who presents emergency department with right-sided facial droop and slurring of her speech is started at 3 PM.  Patient states this has happened multiple times in the past and has had multiple hospitalizations with no diagnoses for her symptoms.  Previously thought to be Bell's palsy however did not improve with treatment went away on its own eventually at the time.  Patient states that she is also having chest pain and is hypertensive did take her blood pressure medications this morning.  Denies any numbness or weakness.    Independent Historian (other than patient): No  Additional History Provided by Independent Historian:  MEDICAL HISTORY     Past Medical History:  Past Medical History:   Diagnosis Date    Hyperlipidemia     Hypertension     Migraine     Morbid obesity     Palpitations     loop recorder placed in 2019, had to be removed after a month due to misplacement and keloid formation per patient    Seizures     Stroke     2015    Thoracic aortic aneurysm     TIA (transient ischemic attack) 2017       Past Surgical History:  Past Surgical History[1]    Social History:  Social History[2]    Family History:  Family History[3]    Outpatient Medication:  Previous Medications    AMLODIPINE (NORVASC) 10 MG TABLET    Take 1 tablet (10 mg) by mouth daily    ASPIRIN EC 325 MG TABLET    Take 1 tablet (325 mg) by mouth daily    ATORVASTATIN (LIPITOR) 80 MG TABLET    Take 1 tablet (80 mg) by mouth daily    HYDROCHLOROTHIAZIDE (HYDRODIURIL) 25 MG TABLET    Take 1 tablet (25 mg) by mouth daily    LEVETIRACETAM (KEPPRA  PO)    Take 500 mg by mouth 2 (two) times daily.       LISINOPRIL (ZESTRIL) 20 MG TABLET    Take 1 tablet (20 mg) by mouth daily    PREGABALIN (LYRICA) 50 MG CAPSULE    Take 1 capsule (50 mg) by mouth 3 (three) times daily         REVIEW OF SYSTEMS   Review of Systems   Cardiovascular:  Positive for chest pain.   Gastrointestinal:  Negative for abdominal pain.   Musculoskeletal:  Negative for gait problem.   Neurological:  Positive for facial asymmetry and speech difficulty. Negative for tremors, seizures and weakness.    See History of Present Illness  PHYSICAL EXAM     ED Triage Vitals [01/05/23 2051]   Encounter Vitals Group      BP (!) 191/125      Systolic BP Percentile       Diastolic BP Percentile       Heart Rate 74  Resp Rate 20      Temp 98.5 F (36.9 C)      Temp src Oral      SpO2 96 %      Weight (!) 159 kg      Height       Head Circumference       Peak Flow       Pain Score       Pain Loc       Pain Education       Exclude from Growth Chart      Physical Exam  Constitutional:       Appearance: She is not ill-appearing or diaphoretic.   HENT:      Head: Normocephalic and atraumatic.      Nose: Nose normal.      Mouth/Throat:      Mouth: Mucous membranes are moist.   Eyes:      Conjunctiva/sclera: Conjunctivae normal.   Cardiovascular:      Rate and Rhythm: Normal rate and regular rhythm.      Heart sounds: Normal heart sounds.   Pulmonary:      Effort: Pulmonary effort is normal. No respiratory distress.      Breath sounds: Normal breath sounds. No stridor. No wheezing, rhonchi or rales.   Abdominal:      General: Abdomen is flat. There is no distension.      Palpations: Abdomen is soft.      Tenderness: There is no abdominal tenderness. There is no guarding or rebound.   Musculoskeletal:      Cervical back: Neck supple. No rigidity.      Right lower leg: No edema.      Left lower leg: No edema.   Skin:     General: Skin is warm.      Capillary Refill: Capillary refill takes less than 2 seconds.    Neurological:      Mental Status: She is alert and oriented to person, place, and time.      GCS: GCS eye subscore is 4. GCS verbal subscore is 5. GCS motor subscore is 6.      Cranial Nerves: Cranial nerve deficit and facial asymmetry present.      Sensory: Sensation is intact.      Motor: No abnormal muscle tone.      Comments: Nih 3, slight slurring of sppech   Psychiatric:         Mood and Affect: Mood normal.          MEDICAL DECISION MAKING     PRIMARY PROBLEM LIST      Chronic Illness with Exacerbation/Progression DIAGNOSIS:facial droop  Chronic Illness Impacting Care of the above problem: stroke and Hypertension Increases complexity of evaluation, Increases the risk of severe disease, and Increase the risk of disease progression  Differential Diagnosis: Neuro Deficit: CVA, TIA, intracranial hemorrhage, Peripheral Neuropathy, Cranial Nerve Palsy, demyelinating disease    DISCUSSION      This is a 47 year old woman with history of stroke and hypertension presenting the emergency department with right-sided facial droop.  Given the timing of her symptoms was made a stroke alert and CT head was obtained.  Did not obtain CTA as patient has significant contrast allergy and was intubated after receiving contrast for CT and after getting pretreatment.  Also obtain blood work, EKG, chest x-ray further evaluation of her chest and given IV hydralazine.    If patient is being hospitalized is severe sepsis or septic shock  suspected?: N/A      Was management discussed with a consultant?: Yes (explain) neurologist and hospitalist    External Records Reviewed?: Physician Office Records and Inpatient Records    Additional Notes              ED Course as of 01/05/23 2314   Fri Jan 05, 2023   2230 CT head negative [ML]   2230 WBC(!): 10.24 [ML]   2231 Hemoglobin: 13.6  No anemia [ML]   2231 Creatinine: 0.9  Normal renal function [ML]   2231 BHCG Qualitative: Negative [ML]   2231 hs Troponin-I: 2.7  ACS unlikely [ML]   2231  CXR negative   [ML]   2231 Spoke with neurologist on-call, given patient's recurrent symptoms less likely stroke.  Recommending repeat MRI with contrast to evaluate for neuro inflammation not seen on previous negative MRIs without contrast [ML]   2231 BP: 153/60  Improved with hydralazine [ML]   2231 Spoke with hospitalist, will be admitted. [ML]      ED Course User Index  [ML] Hedy Camara, DO     NIH Stroke Score      Flowsheet Row Most Recent Value   NIH Stroke Scale    Interval Baseline admission to ED   1a. Level of Consciousness 0   1c. LOC Commands (Open and close eyes, Grip AND release good hand) 0   2. Best Gaze 0   3. Visual Fields 0   4. Facial Palsy 2   5a. Motor Left Arm: (Arms with palm down X 10 seconds. Sitting = arms at 90 degrees. Supine = arms 45 degrees) 0   5b. Motor Right Arm: (Arms with palm down X 10 seconds. Sitting = arms at 90 degrees Supine = arms 45 degrees) 0   Total Motor Arms 0   6a. Motor Left Leg: (Leg elevated X 5 seconds Supine = Leg 30 degrees) 0   6b. Motor Right Leg: (Leg elevated X 5 seconds Supine = Leg 30 degrees) 0   Total Motor Legs 0   7. Limb Ataxia (Finger to nose, heel to shin) 0   8. Sensory (Sensation or grimace to pin prick on face, arm, trunk, and leg) 0   9. Best language (Describe picture, name items, read sentences from NIHSS booklet) 0   10. Dysarthria (Read list from NIHSS booklet) 0   11. Extinction and Inattention (formerly Neglect) - Test tactile and visual stimulation 0            Vital Signs: Reviewed the patient's vital signs.   Nursing Notes: Reviewed and utilized available nursing notes.  Medical Records Reviewed: Reviewed available past medical records.  Counseling: The emergency  has spoken with the patient and discussed today's findings, in addition to providing specific details for the plan of care.  Questions are answered and there is agreement with the plan.      CARDIAC STUDIES    The following cardiac studies were independently  interpreted by me the Emergency Medicine .  For full cardiac study results please see chart.    Monitor Strip interpreted by me (ED )  Rate: 60-100  Rhythm: Normal Sinus Rhythm  ST segments: No acute changes    EKG 1 interpreted by me (ED )      Rate: 60-100  Rhythm: Normal Sinus Rhythm  ST segments: No acute changes  STEMI?: NO  EKG interpretation: Normal  RADIOLOGY IMAGING STUDIES      Chest AP Portable   Final Result         1. No acute cardiopulmonary process      J. Carole Binning, MD   01/05/2023 9:46 PM      CT Head WO Contrast   Final Result       1.No intracranial abnormality is seen.      Theodoro Doing, MD   01/05/2023 9:03 PM      CT Perfusion Brain    (Results Pending)   MRI brain without contrast    (Results Pending)   Echocardiogram Adult Comp W Clr/Dop/Bubble - (For patients less than 67 yo)    (Results Pending)       EMERGENCY IMAGING STUDIES    The following imagine studies were independently interpreted by me (emergency medicine ):               CT Head Interpreted by me (ED )    RESULT: No Hemorrhage or Mass Effect  IMPRESSION: No acute abnormality    EMERGENCY DEPT. MEDICATIONS      ED Medication Orders (From admission, onward)      Start Ordered     Status Ordering     01/05/23 2314 01/05/23 2313  heparin (porcine) injection 5,000 Units  Every 8 hours scheduled        Route: Subcutaneous  Ordered Dose: 5,000 Units       Ordered JABBAR, LUBNA    01/05/23 2311 01/05/23 2313  ondansetron (ZOFRAN-ODT) disintegrating tablet 4 mg  Every 6 hours PRN        Route: Oral  Ordered Dose: 4 mg      Placed in "Or" Linked Group    Ordered JABBAR, LUBNA    01/05/23 2311 01/05/23 2313  ondansetron (ZOFRAN) injection 4 mg  Every 6 hours PRN        Route: Intravenous  Ordered Dose: 4 mg      Placed in "Or" Linked Group    Ordered JABBAR, LUBNA    01/05/23 2310 01/05/23 2309  aspirin chewable tablet 81 mg  Once        Route: Oral  Ordered Dose: 81  mg       Ordered JABBAR, LUBNA    01/05/23 2217 01/05/23 2216  HYDROmorphone (DILAUDID) injection 0.5 mg  Once        Route: Intravenous  Ordered Dose: 0.5 mg       Last MAR action: Given , [facility]    01/05/23 2108 01/05/23 2107  hydrALAZINE (APRESOLINE) injection 10 mg  Once        Route: Intravenous  Ordered Dose: 10 mg       Last MAR action: Given , [facility]    01/05/23 2051 01/05/23 2050    Once        Route: Intravenous  Ordered Dose: 125 mg       Discontinued ,     01/05/23 2051 01/05/23 2050    Once        Route: Intravenous  Ordered Dose: 25 mg       Discontinued ,             LABORATORY RESULTS    Ordered and independently interpreted AVAILABLE laboratory tests.   Results       Procedure Component Value Units Date/Time    Urinalysis with Reflex to Microscopic Exam [[id number]]  (Abnormal) Collected: 01/05/23 2231    Specimen: Urine, Clean Catch Updated: 01/05/23  2240     Urine Color Straw     Urine Clarity Hazy     Urine Specific Gravity 1.018     Urine pH 6.5     Urine Leukocyte Esterase Trace     Urine Nitrite Negative     Urine Protein 10= Trace     Urine Glucose Negative     Urine Ketones Negative mg/dL      Urine Urobilinogen Normal mg/dL      Urine Bilirubin Negative     Urine Blood Negative     RBC, UA 3-5 /hpf      Urine WBC 6-10 /hpf      Urine Squamous Epithelial Cells 6-10 /hpf     Urine [employer] Tube [[id number]] Collected: 01/05/23 2231    Specimen: Urine, Clean Catch Updated: 01/05/23 2234    Comprehensive Metabolic Panel [[id number]]  (Abnormal) Collected: 01/05/23 2125    Specimen: Blood, Venous Updated: 01/05/23 2155     Glucose 99 mg/dL      BUN 15 mg/dL      Creatinine 0.9 mg/dL      Sodium 951 mEq/L      Potassium 3.6 mEq/L      Chloride 111 mEq/L      CO2 22 mEq/L      Calcium 9.0 mg/dL      Anion Gap 88.4     GFR >60.0 mL/min/1.73 m2      AST (SGOT) 25 U/L      ALT 37 U/L      Alkaline Phosphatase 97 U/L      Albumin 3.7 g/dL      Protein, Total 7.6 g/dL       Globulin 3.9 g/dL      Albumin/Globulin Ratio 0.9     Bilirubin, Total 0.6 mg/dL     CBC with Differential (Order) [[id number]]  (Abnormal) Collected: 01/05/23 2125    Specimen: Blood, Venous Updated: 01/05/23 2144    Narrative:      The following orders were created for panel order CBC with Differential (Order).  Procedure                               Abnormality         Status                     ---------                               -----------         ------                     CBC with Differential (C.Marland KitchenMarland Kitchen[[id number]]  Abnormal            Final result                 Please view results for these tests on the individual orders.    CBC with Differential (Component) [[id number]]  (Abnormal) Collected: 01/05/23 2125    Specimen: Blood, Venous Updated: 01/05/23 2144     WBC 10.24 x10 3/uL      Hemoglobin 13.6 g/dL      Hematocrit 70.6 %      Platelet Count 160 x10 3/uL      MPV 13.1 fL      RBC 4.27 x10 6/uL  MCV 94.4 fL      MCH 31.9 pg      MCHC 33.7 g/dL      RDW 13 %      nRBC % 0.0 /100 WBC      Absolute nRBC 0.00 x10 3/uL      Preliminary Absolute Neutrophil Count 6.27 x10 3/uL      Neutrophils % 61.3 %      Lymphocytes % 28.2 %      Monocytes % 6.9 %      Eosinophils % 2.8 %      Basophils % 0.5 %      Immature Granulocytes % 0.3 %      Absolute Neutrophils 6.27 x10 3/uL      Absolute Lymphocytes 2.89 x10 3/uL      Absolute Monocytes 0.71 x10 3/uL      Absolute Eosinophils 0.29 x10 3/uL      Absolute Basophils 0.05 x10 3/uL      Absolute Immature Granulocytes 0.03 x10 3/uL     High Sensitivity Troponin-I [[id number]]  (Normal) Collected: 01/05/23 2107    Specimen: Blood, Venous Updated: 01/05/23 2137     hs Troponin 2.7 ng/L     Serum Beta HCG Qualitative [[id number]]  (Normal) Collected: 01/05/23 2107    Specimen: Blood, Venous Updated: 01/05/23 2123     hCG Qualitative Negative    PT/INR [[id number]]  (Normal) Collected: 01/05/23 2107    Specimen: Blood, Venous Updated: 01/05/23 2123     PT 11.8 sec       INR 1.0    APTT [[id number]]  (Normal) Collected: 01/05/23 2107    Specimen: Blood, Venous Updated: 01/05/23 2123     PTT 34 sec     Whole Blood Glucose POCT [[id number]]  (Normal) Collected: 01/05/23 2045    Specimen: Blood, Capillary Updated: 01/05/23 2046     Whole Blood Glucose POCT 94 mg/dL               CRITICAL CARE/PROCEDURES    Critical Care    Performed by: Hedy Camara, DO  Authorized by: Hedy Camara, DO    Critical care  statement:     Critical care time (minutes):  31    Critical care was necessary to treat or prevent imminent or life-threatening deterioration of the following conditions:  CNS failure or compromise    Critical care was time spent personally by me on the following activities:  Blood draw for specimens, development of treatment plan with patient or surrogate, discussions with consultants, evaluation of patient's response to treatment, examination of patient, review of old charts, re-evaluation of patient's condition, pulse oximetry, ordering and review of radiographic studies, ordering and review of laboratory studies and ordering and performing treatments and interventions    Care discussed with: admitting         DIAGNOSIS      Diagnosis:  Final diagnoses:   Facial droop       Disposition:  ED Disposition       ED Disposition   Expedited Observation    Condition   --    Date/Time   Fri Jan 05, 2023 10:20 PM    Comment   Admitting Physician: Marya Amsler [[id number]]   Estimated Length of Stay: < 2 midnights   Tentative Discharge Plan?: Home or Self Care [1]                 Prescriptions:  Patient's Medications   New Prescriptions  No medications on file   Previous Medications    AMLODIPINE (NORVASC) 10 MG TABLET    Take 1 tablet (10 mg) by mouth daily    ASPIRIN EC 325 MG TABLET    Take 1 tablet (325 mg) by mouth daily    ATORVASTATIN (LIPITOR) 80 MG TABLET    Take 1 tablet (80 mg) by mouth daily    HYDROCHLOROTHIAZIDE (HYDRODIURIL) 25 MG TABLET    Take 1 tablet (25 mg) by  mouth daily    LEVETIRACETAM (KEPPRA PO)    Take 500 mg by mouth 2 (two) times daily.       LISINOPRIL (ZESTRIL) 20 MG TABLET    Take 1 tablet (20 mg) by mouth daily    PREGABALIN (LYRICA) 50 MG CAPSULE    Take 1 capsule (50 mg) by mouth 3 (three) times daily   Modified Medications    No medications on file   Discontinued Medications    No medications on file           This note was generated by the Epic EMR system/ Dragon speech recognition and may contain inherent errors or omissions not intended by the user. Grammatical errors, random word insertions, deletions and pronoun errors  are occasional consequences of this technology due to software limitations. Not all errors are caught or corrected. If there are questions or concerns about the content of this note or information contained within the body of this dictation they should be addressed directly with the author for clarification.           [1]   Past Surgical History:  Procedure Laterality Date    APPENDECTOMY (OPEN)      CHOLECYSTECTOMY      EGD, BIOPSY N/A 04/16/2017    Procedure: EGD, BIOPSY;  Surgeon: Pershing Proud, MD;  Location: [facility];  Service: Gastroenterology;  Laterality: N/A;    HYSTERECTOMY     [2]   Social History  Socioeconomic History    Marital status: Married   Tobacco Use    Smoking status: Never    Smokeless tobacco: Never   Vaping Use    Vaping status: Never Used   Substance and Sexual Activity    Alcohol use: No    Drug use: No     Social Drivers of Psychologist, prison and probation services Strain: Low Risk  (10/31/2022)    Overall Financial Resource Strain (CARDIA)     Difficulty of Paying Living Expenses: Not very hard   Food Insecurity: No Food Insecurity (10/30/2022)    Hunger Vital Sign     Worried About Running Out of Food in the Last Year: Never true     Ran Out of Food in the Last Year: Never true   Transportation Needs: No Transportation Needs (10/31/2022)    PRAPARE - Therapist, art (Medical): No     Lack  of Transportation (Non-Medical): No   Physical Activity: Sufficiently Active (09/09/2022)    Exercise Vital Sign     Days of Exercise per Week: 2 days     Minutes of Exercise per Session: 120 min   Recent Concern: Physical Activity - Insufficiently Active (06/30/2022)    Exercise Vital Sign     Days of Exercise per Week: 3 days     Minutes of Exercise per Session: 30 min   Stress: No Stress Concern Present (06/30/2022)    [employer] of Occupational Health - Occupational Stress Questionnaire  Feeling of Stress : Not at all   Social Connections: Socially Integrated (06/30/2022)    Social Connection and Isolation Panel [NHANES]     Frequency of Communication with Friends and Family: Three times a week     Frequency of Social Gatherings with Friends and Family: Once a week     Attends Religious Services: More than 4 times per year     Active Member of [employer] or Organizations: Yes     Attends Engineer, structural: More than 4 times per year     Marital Status: Married   Catering manager Violence: Not At Risk (10/30/2022)    Humiliation, Afraid, Rape, and Kick questionnaire     Fear of Current or Ex-Partner: No     Emotionally Abused: No     Physically Abused: No     Sexually Abused: No   Housing Stability: Low Risk  (10/31/2022)    Housing Stability Vital Sign     Unable to Pay for Housing in the Last Year: No     Number of Times Moved in the Last Year: 0     Homeless in the Last Year: No   [3]   Family History  Problem Relation Name Age of Onset    Myocardial Infarction Father  7    Deep vein thrombosis Father      No known problems Mother          Hedy Camara, [place]  01/05/23 2314

## 2023-01-06 DIAGNOSIS — Z6841 Body Mass Index (BMI) 40.0 and over, adult: Secondary | ICD-10-CM

## 2023-01-06 LAB — PT/INR
INR: 1.1 (ref 0.9–1.1)
PT: 12 s (ref 10.1–12.9)

## 2023-01-06 LAB — CBC
Absolute nRBC: 0 10*3/uL (ref <=0.00)
Hematocrit: 42 % (ref 34.7–43.7)
Hemoglobin: 14.2 g/dL (ref 11.4–14.8)
MCH: 32 pg (ref 25.1–33.5)
MCHC: 33.8 g/dL (ref 31.5–35.8)
MCV: 94.6 fL (ref 78.0–96.0)
MPV: 12.7 fL — ABNORMAL HIGH (ref 8.9–12.5)
Platelet Count: 160 10*3/uL (ref 142–346)
RBC: 4.44 10*6/uL (ref 3.90–5.10)
RDW: 13 % (ref 11–15)
WBC: 9.94 10*3/uL — ABNORMAL HIGH (ref 3.10–9.50)
nRBC %: 0 /100{WBCs} (ref <=0.0)

## 2023-01-06 LAB — ECG 12-LEAD
Atrial Rate: 65 {beats}/min
IHS MUSE NARRATIVE AND IMPRESSION: NORMAL
P Axis: 33 degrees
P-R Interval: 144 ms
Q-T Interval: 458 ms
QRS Duration: 84 ms
QTC Calculation (Bezet): 476 ms
R Axis: 3 degrees
T Axis: 12 degrees
Ventricular Rate: 65 {beats}/min

## 2023-01-06 LAB — SARS-COV-2 (COVID-19) RNA, PCR (LIAT): SARS-CoV-2 (COVID-19) RNA: NOT DETECTED

## 2023-01-06 LAB — LIPID PANEL
Cholesterol / HDL Ratio: 4.7 {index}
Cholesterol: 184 mg/dL (ref <=199)
HDL: 39 mg/dL — ABNORMAL LOW
LDL Calculated: 126 mg/dL — ABNORMAL HIGH (ref 0–99)
Triglycerides: 94 mg/dL
VLDL Calculated: 19 mg/dL (ref 10–40)

## 2023-01-06 LAB — LAB USE ONLY - URINE GRAY CULTURE HOLD TUBE

## 2023-01-06 LAB — APTT: PTT: 31 s (ref 27–39)

## 2023-01-06 LAB — BASIC METABOLIC PANEL
Anion Gap: 7 (ref 5.0–15.0)
BUN: 14 mg/dL (ref 7–21)
CO2: 23 meq/L (ref 17–29)
Calcium: 9.1 mg/dL (ref 8.5–10.5)
Chloride: 111 meq/L (ref 99–111)
Creatinine: 0.8 mg/dL (ref 0.4–1.0)
GFR: >60 mL/min/{1.73_m2} (ref >=60.0)
Glucose: 127 mg/dL — ABNORMAL HIGH (ref 70–100)
Potassium: 3.8 meq/L (ref 3.5–5.3)
Sodium: 141 meq/L (ref 135–145)

## 2023-01-06 LAB — HEMOGLOBIN A1C
Average Estimated Glucose: 119.8 mg/dL
Hemoglobin A1C: 5.8 % — ABNORMAL HIGH (ref 4.6–5.6)

## 2023-01-06 LAB — HIGH SENSITIVITY TROPONIN-I: hs Troponin: <2.7 ng/L (ref <=14.0)

## 2023-01-06 LAB — TSH: TSH: 2.6 u[IU]/mL (ref 0.35–4.94)

## 2023-01-06 MED ORDER — MORPHINE SULFATE 2 MG/ML IJ/IV SOLN (WRAP)
0.5000 mg | Freq: Once | Status: DC
Start: 2023-01-06 — End: 2023-01-06

## 2023-01-06 MED ORDER — HYDROCHLOROTHIAZIDE 25 MG PO TABS
25.0000 mg | ORAL_TABLET | Freq: Every day | ORAL | Status: DC
Start: 2023-01-06 — End: 2023-01-06
  Administered 2023-01-06: 25 mg via ORAL
  Filled 2023-01-06: qty 1

## 2023-01-06 MED ORDER — MORPHINE SULFATE 2 MG/ML IJ/IV SOLN (WRAP)
0.5000 mg | Freq: Once | Status: AC
Start: 2023-01-06 — End: 2023-01-06
  Administered 2023-01-06: 0.5 mg via INTRAVENOUS
  Filled 2023-01-06: qty 1

## 2023-01-06 MED ORDER — METHOCARBAMOL 500 MG PO TABS
500.0000 mg | ORAL_TABLET | Freq: Four times a day (QID) | ORAL | Status: DC | PRN
Start: 2023-01-06 — End: 2023-01-06

## 2023-01-06 MED ORDER — AMLODIPINE BESYLATE 5 MG PO TABS
10.0000 mg | ORAL_TABLET | Freq: Every day | ORAL | Status: DC
Start: 2023-01-06 — End: 2023-01-06
  Administered 2023-01-06: 10 mg via ORAL
  Filled 2023-01-06: qty 2

## 2023-01-06 MED ORDER — LISINOPRIL 10 MG PO TABS
20.0000 mg | ORAL_TABLET | Freq: Every day | ORAL | Status: DC
Start: 2023-01-06 — End: 2023-01-06
  Administered 2023-01-06: 20 mg via ORAL
  Filled 2023-01-06: qty 2

## 2023-01-06 NOTE — Progress Notes (Signed)
Pt IV access 'd;Tele Monitor and lines removed; Discharge Instructions and Orders given and acknowledged by patient. Pertinent education provided. Pt discharge self care home. Husband waiting for her outside.

## 2023-01-06 NOTE — Discharge Summary (Signed)
DISCHARGE SUMMARY/PROGRESS NOTE  [facility], DIVISION OF HOSPITALIST MEDICINE    [facility]      Date Time: 01/06/23 12:13 PM  Patient Name: [name] Cook  Attending Physician: Michel Santee, MD  Primary Care Physician: Unknown Foley, NP    Date of Admission: 01/05/2023  Date of Discharge: 01/06/2023    Outcome of Hospitalization:   #Recurrent right sided facial droop, right sided paresthesias/weakness - suspect 2/2 recrudescence of old stroke versus HTN urgency  #Hypertensive urgency, with history of hypertension  #Leukocytosis, likely reactive, improved     Chronic:  #Hypertension  #Hyperlipidemia  #Prediabetes, A1c 5.8% (12/2022)  #Seizure disorder  #Hx of CVA (2015), multiple TIAs (2017)        Significant Events:   Patient is a 47 year old female with PMH of HTN, HLD, prediabetes, seizure disorder, h/o CVA (2015), multiple TIA's (2017), chronic right sided facial droop who presented to ED for recurrent right sided facial droop, right sided paresthesias/weakness. Of note, patient was recently admitted from 8/19-8/25/2024 for the same, suspected to be psychogenic in nature and has history of several admissions related to the same complaint. She was seen by neurology at the time and treated with Valtrex/prednisone for possible Bell's palsy.     On ED arrival, patient was hypertensive (BP 191/125).  EKG demonstrated NSR with rate of 65 bpm.  Labs were notable for: WBC 9.94 and grossly unremarkable CMP.  Troponin was negative x 2.  Lipid panel revealed low HDL 39 and elevated LDL 126.  A1c was mildly elevated at 5.8%.  TSH was WNL. COVID swab was negative. UA revealed trace LE and WBC 6-10. CXR and CTH were negative for acute process.  Patient received aspirin 81 mg x 1, IV hydralazine 10 mg x 1, IV Dilaudid 0.5 mg x 1, and IV morphine 0.5 mg x 2 in the ED.     Stroke alert was called in the ED, vascular neurology was consulted and recommended obtaining MRI brain with/without  contrast.  However, patient has history of contrast allergy, therefore MRI brain without contrast was ordered -- found to be negative for acute intracranial abnormalities.  Patient was seen by SLP who recommended regular solids and thin liquids.  Suspect etiology of patient's symptoms likely secondary to recrudescence of old CVA versus hypertensive urgency.  Instructed patient to check/record her BP readings at home and follow-up with her PCP for further management.  Should follow-up with OP neurologist in 2-3 weeks.  Should follow-up with PCP in 3-5 days.         Minutes spent coordinating discharge and reviewing discharge plan: 42 minutes  Today:   BP 151/69   Pulse 80   Temp 97.9 F (36.6 C) (Oral)   Resp 22   Ht 1.778 m (5\' 10" )   Wt (!) 148.7 kg (327 lb 13.2 oz)   LMP 10/15/2018 (Exact Date)   SpO2 94%   BMI 47.04 kg/m   Ranges for the last 24 hours:  Temp:  [97.6 F (36.4 C)-98.5 F (36.9 C)] 97.9 F (36.6 C)  Heart Rate:  [71-80] 80  Resp Rate:  [14-22] 22  BP: (133-193)/(60-125) 151/69  BP 151/69   Pulse 80   Temp 97.9 F (36.6 C) (Oral)   Resp 22   Ht 1.778 m (5\' 10" )   Wt (!) 148.7 kg (327 lb 13.2 oz)   LMP 10/15/2018 (Exact Date)   SpO2 94%   BMI 47.04 kg/m   General appearance: alert, appears stated  age, and cooperative  Lungs: clear to auscultation bilaterally  Heart: regular rate and rhythm, S1, S2 normal, no murmur, click, rub or gallop  Abdomen: soft, non-tender; bowel sounds normal; no masses,  no organomegaly  Neurologic: right-sided facial droop/decreased sensation, RLE weakness     Procedures performed:   Radiology: all results from this admission  MRI brain without contrast    Result Date: 01/06/2023   1.No acute intracranial abnormality. No acute stroke. Leota Jacobsen, MD 01/06/2023 8:07 AM    Chest AP Portable    Result Date: 01/05/2023  1. No acute cardiopulmonary process J. Carole Binning, MD 01/05/2023 9:46 PM    CT Head WO Contrast    Result Date: 01/05/2023    1.No intracranial abnormality is seen. Theodoro Doing, MD 01/05/2023 9:03 PM    CT Abd/ Pelvis without Contrast    Result Date: 12/19/2022  1. No detectable acute abnormality. 2. No bowel obstruction or detectable bowel inflammation; moderate stool burden. 3. No renal calculi or hydronephrosis. 4. Remainder as above. Wilmon Pali, MD 12/19/2022 10:53 PM    Chest AP Portable    Result Date: 12/19/2022  No detectable active cardiopulmonary disease. Wilmon Pali, MD 12/19/2022 10:39 PM    NM lung ventilation aerosol single    Result Date: 12/12/2022  IMPRESSION: Normal ventilation/perfusion lung scan; there is no evidence of pulmonary embolism.    XR Chest PA Lateral And Apical Lordotic    Result Date: 12/12/2022  IMPRESSION: No acute cardiopulmonary process. END OF IMPRESSION I have personally reviewed the images and agree with and/or edited the report.   Surgery: all results from this admission  * No surgery found *  Lab Results last 48 Hours       Procedure Component Value Units Date/Time    Lipid Panel [[id number]]  (Abnormal) Collected: 01/06/23 0522    Specimen: Blood, Venous Updated: 01/06/23 0751     Cholesterol 184 mg/dL      Triglycerides 94 mg/dL      HDL 39 mg/dL      LDL Calculated 875 mg/dL      VLDL Calculated 19 mg/dL      Cholesterol / HDL Ratio 4.7 Index     Hemoglobin A1C [[id number]]  (Abnormal) Collected: 01/06/23 0522    Specimen: Blood, Venous Updated: 01/06/23 0751     Hemoglobin A1C 5.8 %      Average Estimated Glucose 119.8 mg/dL     Urine [facility] Tube [[id number]] Collected: 01/05/23 2231    Specimen: Urine, Clean Catch Updated: 01/06/23 0700     Extra Tube Hold for add-ons.    TSH [[id number]]  (Normal) Collected: 01/06/23 0522    Specimen: Blood, Venous Updated: 01/06/23 0614     TSH 2.60 uIU/mL     High Sensitivity Troponin-I (Every 6 hours) [[id number]]  (Normal) Collected: 01/06/23 0522    Specimen: Blood, Venous Updated: 01/06/23 0559     hs Troponin <2.7 ng/L     Basic Metabolic Panel  [[id number]]  (Abnormal) Collected: 01/06/23 0522    Specimen: Blood, Venous Updated: 01/06/23 0556     Glucose 127 mg/dL      BUN 14 mg/dL      Creatinine 0.8 mg/dL      Calcium 9.1 mg/dL      Sodium 427 mEq/L      Potassium 3.8 mEq/L      Chloride 111 mEq/L      CO2 23 mEq/L      Anion Gap 7.0  GFR >60.0 mL/min/1.73 m2     PT/INR [[id number]]  (Normal) Collected: 01/06/23 0522    Specimen: Blood, Venous Updated: 01/06/23 0541     PT 12.0 sec      INR 1.1    APTT [[id number]]  (Normal) Collected: 01/06/23 0522    Specimen: Blood, Venous Updated: 01/06/23 0541     PTT 31 sec     CBC without Differential [[id number]]  (Abnormal) Collected: 01/06/23 0522    Specimen: Blood, Venous Updated: 01/06/23 0536     WBC 9.94 x10 3/uL      Hemoglobin 14.2 g/dL      Hematocrit 47.4 %      Platelet Count 160 x10 3/uL      MPV 12.7 fL      RBC 4.44 x10 6/uL      MCV 94.6 fL      MCH 32.0 pg      MCHC 33.8 g/dL      RDW 13 %      nRBC % 0.0 /100 WBC      Absolute nRBC 0.00 x10 3/uL     COVID-19 (SARS-CoV-2) only ([facility]) asymptomatic admission [[id number]]  (Normal) Collected: 01/06/23 0204    Specimen: Nasopharyngeal Swab from Nasopharynx Updated: 01/06/23 0238     SARS-CoV-2 (COVID-19) RNA Not Detected    Urinalysis with Reflex to Microscopic Exam [[id number]]  (Abnormal) Collected: 01/05/23 2231    Specimen: Urine, Clean Catch Updated: 01/05/23 2240     Urine Color Straw     Urine Clarity Hazy     Urine Specific Gravity 1.018     Urine pH 6.5     Urine Leukocyte Esterase Trace     Urine Nitrite Negative     Urine Protein 10= Trace     Urine Glucose Negative     Urine Ketones Negative mg/dL      Urine Urobilinogen Normal mg/dL      Urine Bilirubin Negative     Urine Blood Negative     RBC, UA 3-5 /hpf      Urine WBC 6-10 /hpf      Urine Squamous Epithelial Cells 6-10 /hpf     Comprehensive Metabolic Panel [[id number]]  (Abnormal) Collected: 01/05/23 2125    Specimen: Blood, Venous Updated: 01/05/23 2155     Glucose 99 mg/dL       BUN 15 mg/dL      Creatinine 0.9 mg/dL      Sodium 160 mEq/L      Potassium 3.6 mEq/L      Chloride 111 mEq/L      CO2 22 mEq/L      Calcium 9.0 mg/dL      Anion Gap 10.9     GFR >60.0 mL/min/1.73 m2      AST (SGOT) 25 U/L      ALT 37 U/L      Alkaline Phosphatase 97 U/L      Albumin 3.7 g/dL      Protein, Total 7.6 g/dL      Globulin 3.9 g/dL      Albumin/Globulin Ratio 0.9     Bilirubin, Total 0.6 mg/dL     CBC with Differential (Order) [[id number]]  (Abnormal) Collected: 01/05/23 2125    Specimen: Blood, Venous Updated: 01/05/23 2144    Narrative:      The following orders were created for panel order CBC with Differential (Order).  Procedure  Abnormality         Status                     ---------                               -----------         ------                     CBC with Differential (C.Marland KitchenMarland Kitchen[[id number]]  Abnormal            Final result                 Please view results for these tests on the individual orders.    CBC with Differential (Component) [[id number]]  (Abnormal) Collected: 01/05/23 2125    Specimen: Blood, Venous Updated: 01/05/23 2144     WBC 10.24 x10 3/uL      Hemoglobin 13.6 g/dL      Hematocrit 66.4 %      Platelet Count 160 x10 3/uL      MPV 13.1 fL      RBC 4.27 x10 6/uL      MCV 94.4 fL      MCH 31.9 pg      MCHC 33.7 g/dL      RDW 13 %      nRBC % 0.0 /100 WBC      Absolute nRBC 0.00 x10 3/uL      Preliminary Absolute Neutrophil Count 6.27 x10 3/uL      Neutrophils % 61.3 %      Lymphocytes % 28.2 %      Monocytes % 6.9 %      Eosinophils % 2.8 %      Basophils % 0.5 %      Immature Granulocytes % 0.3 %      Absolute Neutrophils 6.27 x10 3/uL      Absolute Lymphocytes 2.89 x10 3/uL      Absolute Monocytes 0.71 x10 3/uL      Absolute Eosinophils 0.29 x10 3/uL      Absolute Basophils 0.05 x10 3/uL      Absolute Immature Granulocytes 0.03 x10 3/uL     High Sensitivity Troponin-I [[id number]]  (Normal) Collected: 01/05/23 2107    Specimen: Blood, Venous  Updated: 01/05/23 2137     hs Troponin 2.7 ng/L     Serum Beta HCG Qualitative [[id number]]  (Normal) Collected: 01/05/23 2107    Specimen: Blood, Venous Updated: 01/05/23 2123     hCG Qualitative Negative    PT/INR [[id number]]  (Normal) Collected: 01/05/23 2107    Specimen: Blood, Venous Updated: 01/05/23 2123     PT 11.8 sec      INR 1.0    APTT [[id number]]  (Normal) Collected: 01/05/23 2107    Specimen: Blood, Venous Updated: 01/05/23 2123     PTT 34 sec     Whole Blood Glucose POCT [[id number]]  (Normal) Collected: 01/05/23 2045    Specimen: Blood, Capillary Updated: 01/05/23 2046     Whole Blood Glucose POCT 94 mg/dL             Treatment Team:   Attending Provider: Michel Santee, MD    Disposition:   Disposition: home with family    Condition at Discharge:   stable     Follow up Recommendations for Receiving Provider   Diet- Adult diet Regular  Instructions for after your discharge:  -- Schedule a follow up visit with your primary care provider - within 1 week of discharge from the hospital.  -- Please have labs (CBC) rechecked in 1 week with your primary care team.   -- Follow up with neurology within 2-3 weeks for reevaluation.     -- Resume your home medications, no changes were made.  -- Check and Record blood pressure and pulse twice daily and when not feeling well, take log with you to primary care visit/follow up.    -- Return to the nearest emergency department for chest pain, shortness of breath, fever >100.5 degrees, dizziness, passing out, profuse vomiting or diarrhea, sudden one sided numbness and/or weakness, severe headache, new confusion, trouble speaking, difficulty walking, facial droop, or any other concerning symptoms.  Unresulted Labs       None            Discharge Instructions:      Follow-up Information       Unknown Foley, NP. Schedule an appointment as soon as possible for a visit in 1 week(s).    Specialty: Nurse Practitioner  Contact information:  [address]  [place]  [address]  [phone]               [facility] Neurology - [facility]. Schedule an appointment as soon as possible for a visit in 3 week(s).    Specialty: Neurology  Contact information:  [address]  [place] [address]  [phone]  Additional information:  -We are located at [address], [place] [address], on the [address], across the street from [facility].    -Free Valet Parking (Monday through Friday 7:00am-4:30pm) is available at Zone #1 near the Main Entrance. Look for the signs along [employer]. Free wheelchairs are also available at each building entrance.   -Free Patient and Visitor Self-Parking is available in the "B" - [employer] at the [place] end of the campus or just across from the [employer] on the "D" - Counsellor Lot.   -From the "B" - [employer] enter Retail banker on the level you have parked on (B1, B1.5 or B3) and take the elevator to the 2nd floor.   -Due to the campus size, please be advised that some appointment locations may require a significant amount of walking. For the safety of our patients and visitors, team members are available to provide wheelchair assistance and directions to appointment locations.                            Medication List        CONTINUE taking these medications      amLODIPine 10 MG tablet  Commonly known as: NORVASC  Take 1 tablet (10 mg) by mouth daily     aspirin EC 325 MG tablet     atorvastatin 80 MG tablet  Commonly known as: LIPITOR     hydroCHLOROthiazide 25 MG tablet  Commonly known as: HYDRODIURIL     KEPPRA PO     lisinopril 20 MG tablet  Commonly known as: ZESTRIL     pregabalin 50 MG capsule  Commonly known as: LYRICA  Take 1 capsule (50 mg) by mouth 3 (three) times daily              Signed by: Elgie Congo, PA

## 2023-01-06 NOTE — Progress Notes (Signed)
Patient communicated pain level and sites. Due to multiple drug allergies and patient declining certain options no further pain meds ordered. The pt expressed to me that she is having her husband pick her up to take her to [facility]. PA aware. I encouraged the patient to stay until discharge orders and paperwork can be done versus leaving AMA. Pt is dressed and MD in room with her now.

## 2023-01-06 NOTE — SLP Eval Note (Signed)
[facility]  [address]  [place] [address]  [phone]    Speech Language Pathology Clinical Swallow Evaluation     Patient: [name] Cook    MRN#: [id number]   Unit: [facility] Bed: E 30/E30    Date/Time of Treatment:   Time Calculation  SLP Received On: 01/06/23  Start Time: 0828  Stop Time: 0841  Time Calculation (min): 13 min    Consult received for [name] for SLP Bedside Swallow Evaluation and Treatment.    Interpreter utilized: Not needed    Therapist PPE during session procedural mask and gloves     Assessment   [name] Cook is a 47 y.o. female admitted 01/05/2023 for R Facial droop [R29.810]. PMHx significant for HLD, HTN, Migraine, Seizures, TIA. Multiple admissions for CVA workup per recurrent R facial droop. Consult received per stroke pathway, failed nursing dysphagia screen; however, on regular consistency diet. CT head negative. MRI pending.      Pt appears to present with grossly functional swallow. Prolonged yet adequate mastication of solids with no overt s/sx of aspiration. Presentation similar to previous clinical swallow evaluation in different admissions. Vocal quality dysphonic (e.g. hoarse), which patient is reporting is a change in baseline and previous admissions. She denies throat/neck pain. + R facial droop.      Recommend continue baseline diet of regular solids/thin liquids, meds as tolerated. Alternate solids/liquids. No further acute SLP needs anticipated at this time. Follow up with ENT if dysphonia persists past 2 weeks.  Plan/Recommendations   Plan: begin/continue oral diet  SLP Frequency Recommended: one time visit  Diet Solids Recommendation: Continue with current diet, Regular (IDDSI RG7)  Diet Liquids Recommendations: Thin Liquids (IDDSI TN0)  Precautions/Compensations: Awake/alert, Upright 90 degrees for all oral intake, Alternate solids and liquids, Small bites/sips, Eat/feed slowly  Recommended Form of Meds: PO, with liquid  (as tolerated)  Suggestions for Feeding: independent     Recommendations: No follow up required  Recommendation Discussed With: : Patient, Nurse, Physician  Referral(s):: Neurologist, Otolaryngologist    Goals:   - n/a    Rehabilitation Prognosis: n/a n/a    History     Prior Speech Therapy Intervention: Multiple previous admission per R facial droop with clinical swallow evaluations recommending regular consistency diet with no overt s/sx of aspiration.     Social History: Obtained from: patient  Baseline Diet: Regular Diet, Thin, meds with thin liquids  Feeding: Feeds self independently    Dysphagia Related Hx  Dysphagia: denies   Recurrent PNA: denies   Recent unintentional weight loss: denies   Reflux:denies     Medical Diagnosis: Facial droop [R29.810]    Imaging:  MRI brain without contrast    Result Date: 01/06/2023   1.No acute intracranial abnormality. No acute stroke. [name] Jacobsen, MD 01/06/2023 8:07 AM    Chest AP Portable    Result Date: 01/05/2023  1. No acute cardiopulmonary process J. Carole Binning, MD 01/05/2023 9:46 PM    CT Head WO Contrast    Result Date: 01/05/2023   1.No intracranial abnormality is seen. Theodoro Doing, MD 01/05/2023 9:03 PM      History of Present Illness: [name] Cook is a 47 y.o. female admitted on 01/05/2023 with   Problem List[1]     Past Medical/Surgical History  Medical History[2]   Past Surgical History[3]      Subjective   Patient is agreeable to participation in the therapy session. Nursing clears patient for therapy. Patient's  medical condition is appropriate for Speech therapy intervention at this time.  Patient's Goal:  "I don't know why this keeps happening"  Pain: 5/10, headache, tech and RN aware    Objective   Observation of Patient/Vital Signs:  Patient is in bed   Completed bedside swallow evaluation and provided education regarding swallow, swallow strategies and precautions.  Present at bedside:  n/a    Orientation: + self , + location, + date, and +  situation    Respiratory Status  Current Status  Respiratory Status: room air  Behavior/Mental Status: Awake/alert, Able to follow directions  Nutrition: oral  Diet Prior to Study: Regular (IDDSI RG7), Thin Liquids (IDDSI TN0)    Vital Signs  Temp:  [97.6 F (36.4 C)-98.5 F (36.9 C)] 97.9 F (36.6 C)  Heart Rate:  [71-80] 80  Resp Rate:  [14-21] 15  BP: (133-193)/(60-125) 171/79    Oral Motor Skills  Oral Care: Good    Dentition: Normal  Oral Sensation: WFL  Mucosal Quality: Clear/moist  Secretion Management: WFL    Cranial Nerve Exam  CNV Trigeminal (sensation to face, mandibular movements, chewing):   Reduced sensation   CNVII Facial (taste to anterior 2/3 tongue, raise eyebrows/wrinkle forehead, smile/pucker, puff cheeks with air):   Right facial droop and Right labial asymmetry   CNIX Glossopharyngeal (taste to posterior 1/3 tongue, gag response, velar elevation):   Intact bilaterally  CN X Vagus (sensation to larynx/pharynx, cough reflex, laryngeal function, vocal quality):   Unable to r/o CNX involvement due to presence of dysphonia   CNXII Hypoglossal (lingual strength, movement, symmetry):   Impaired protrusion and Tongue deviates left    Deglutition Skills  Nsg Dysphagia Screen: fail  3 oz water challenge: Completed  Position: upright 90 degrees  Food(s) Tested: Thin (IDDSI TN0);Via spoon;Via cup;Via straw;Pureed (IDDSI P4);Regular (IDDSI RG7)  Oral Stage: adequate  Pharyngeal Stage:  (no overt s/sx of aspiration noted)    Signature  Marjory Sneddon, SLP   01/06/2023  [phone] for Rehab Tech/scheduling questions)  Attention MD:   Thank you for allowing [place] to participate in the care of [name] Cook. Regulations from the Center for Medicare and Medicaid Services (CMS) require your review and approval of this plan of care.     Please cosign this note indicating you are in agreement with the Therapy Plan of Care so we may initiate the therapy treatment plan.       [1]   Patient Active Problem List  Diagnosis     Seizures    Mouth droop due to facial weakness    Tension type headache    History of stroke    Morbid obesity with BMI of 45.0-49.9, adult    Hematemesis    Blood loss anemia    HLD (hyperlipidemia)    Paresthesia of right arm and leg    Hemorrhoids    Right hemiparesis    Nonspecific abnormal electroencephalogram (EEG)    Headache    Abdominal pain    Cellulitis of other specified site    Wound dehiscence    History of seizure disorder    HTN (hypertension)    Right sided weakness    CVA (cerebrovascular accident due to intracerebral hemorrhage)    Pre-diabetes    Acute hypokalemia    Facial droop   [2]   Past Medical History:  Diagnosis Date    Hyperlipidemia     Hypertension     Migraine     Morbid obesity  Palpitations     loop recorder placed in 2019, had to be removed after a month due to misplacement and keloid formation per patient    Seizures     Stroke     2015    Thoracic aortic aneurysm     TIA (transient ischemic attack) 2017   [3]   Past Surgical History:  Procedure Laterality Date    APPENDECTOMY (OPEN)      CHOLECYSTECTOMY      EGD, BIOPSY N/A 04/16/2017    Procedure: EGD, BIOPSY;  Surgeon: Pershing Proud, MD;  Location: [facility];  Service: Gastroenterology;  Laterality: N/A;    HYSTERECTOMY

## 2023-01-06 NOTE — UM Notes (Signed)
PATIENT NAME: [name] Cook, [name] Cook   DOB: [birth year]      47 y.o. 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) and morbid obesity admitted with      # Stroke like symptoms: Recurrent R facial droop, Paresthesia-R sided (Face/RUE/RLE and weakness in RLE  # Hypertensive urgency with blood pressure 191/125  # History of CVA, epilepsy (on Keppra)  # Recurrent R facial droop required multiple hospitalizations (Suspicion for Bells palsy in prior notes) # Hypertension  # Hyperlipidemia   # 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  -Neurology consulted by ED physician, recommended MRI with contrast, however holding for above-mentioned reason.  -Will give aspirin x 1  -Initiating stroke workup protocol with labs, imaging, dysphagia screening and other stroke workup.  -Fall precaution  -Placing PT/OT consult  -Patient received hydralazine in ED, will allow permissive hypertension.  -Telemetry monitoring.  -Zofran IV as needed for nausea and vomiting  -Will send COVID/influenza  -Will trend troponin  -Will continue home medications which patient provided during evaluation while holding antihypertensives.  -Lifestyle modification with diet and exercise    Admit to Expedited Observation (Order #[id number]) on 01/05/23         Transient Ischemic Attack (TIA): Observation Care Observation Care Admission Criteria       UTILIZATION REVIEW CONTACT: Name: PAM Kamiyah Kindel  Utilization Review  [facility]  Address:  [address]  [address] [place], [place]  [address]  NPI:    [id number]  Tax ID:  [id number]  Phone: [phone] voice mail only  Fax: [phone]    Please use fax number [phone] to provide authorization for hospital services or to request additional information.        NOTES TO REVIEWER:     This clinical review is based on/compiled from documentation provided by the treatment team within the patient's medical record.

## 2023-01-06 NOTE — Discharge Instr - AVS First Page (Addendum)
Reason for your Hospital Admission:  Stroke like symptoms    You were observed in the hospital because you experienced neurological symptoms. We monitored your heart, checked imaging of your brain, and checked blood tests to ensure you were not having an active stroke. You did not have a stroke. 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 Stroke recrudescence occurs when someone redevelops symptoms they experienced after having a stroke they had previously recovered from. This may result in the reappearance of neurological or physical symptoms associated with stroke. While much remains unknown about ischemic stroke recrudescence, some factors seem to increase a person's risk of developing it. Recognized triggers of ischemic stroke recrudescence include: infections, stress conditions, blood sugar irregularities, insomnia or lack of proper sleep, hypertension (high blood pressure), low sodium levels (hyponatremia), vascular diseases, use of sedating medications, or intoxication.  . You should follow up with your primary care provider this week to ensure ongoing improvement.    Instructions for after your discharge:  -- Schedule a follow up visit with your primary care provider - within 1 week of discharge from the hospital.  -- Please have labs (CBC) rechecked in 1 week with your primary care team.   -- Follow up with neurology within 2-3 weeks for reevaluation.     -- Resume your home medications, no changes were made.  -- Check and Record blood pressure and pulse twice daily and when not feeling well, take log with you to primary care visit/follow up.    -- Return to the nearest emergency department for chest pain, shortness of breath, fever >100.5 degrees, dizziness, passing out, profuse vomiting or diarrhea, sudden one sided numbness and/or weakness, severe headache, new confusion, trouble speaking, difficulty walking, facial droop, or any other concerning symptoms.

## 2023-01-06 NOTE — Plan of Care (Signed)
Problem: Pain interferes with ability to perform ADL  Goal: Pain at adequate level as identified by patient  Outcome: Progressing  Flowsheets (Taken 01/06/2023 0128)  Pain at adequate level as identified by patient:   Identify patient comfort function goal   Assess pain on admission, during daily assessment and/or before any "as needed" intervention(s)   Reassess pain within 30-60 minutes of any procedure/intervention, per Pain Assessment, Intervention, Reassessment (AIR) Cycle   Evaluate if patient comfort function goal is met   Evaluate patient's satisfaction with pain management progress     Problem: Side Effects from Pain Analgesia  Goal: Patient will experience minimal side effects of analgesic therapy  Outcome: Progressing  Flowsheets (Taken 01/06/2023 0128)  Patient will experience minimal side effects of analgesic therapy:   Monitor/assess patient's respiratory status (RR depth, effort, breath sounds)   Assess for changes in cognitive function   Prevent/manage side effects per LIP orders (i.e. nausea, vomiting, pruritus, constipation, urinary retention, etc.)   Evaluate for opioid-induced sedation with appropriate assessment tool (i.e. POSS)     Problem: Neurological Deficit  Goal: Neurological status is stable or improving  Outcome: Progressing  Flowsheets (Taken 01/06/2023 0128)  Neurological status is stable or improving:   Monitor/assess/document neurological assessment (Stroke: every 4 hours)   Monitor/assess NIH Stroke Scale   Observe for seizure activity and initiate seizure precautions if indicated   Re-assess NIH Stroke Scale for any change in status   Perform CAM Assessment

## 2023-03-08 NOTE — ED Provider Notes (Signed)
 Chief Complaint   Patient presents with   . Chest Pain     C/o chest pain, vomiting, weakness, feeling warm for 3 days. Hx seizures, HTN, Hyperlipidemia, stroke (2015), TIAs.    . Dizziness     C/o dizziness since yesterday 10 AM.        History of Present

## 2023-03-10 ENCOUNTER — Emergency Department: Payer: Medicaid Other

## 2023-03-10 ENCOUNTER — Observation Stay
Admission: EM | Admit: 2023-03-10 | Discharge: 2023-03-11 | Disposition: A | Discharge status: Home / Self Care | Payer: Medicaid Other | Attending: Hospitalist | Admitting: Hospitalist

## 2023-03-10 DIAGNOSIS — E876 Hypokalemia: Secondary | ICD-10-CM | POA: Insufficient documentation

## 2023-03-10 DIAGNOSIS — Z91041 Radiographic dye allergy status: Secondary | ICD-10-CM | POA: Insufficient documentation

## 2023-03-10 DIAGNOSIS — Z8673 Personal history of transient ischemic attack (TIA), and cerebral infarction without residual deficits: Secondary | ICD-10-CM | POA: Insufficient documentation

## 2023-03-10 DIAGNOSIS — R001 Bradycardia, unspecified: Secondary | ICD-10-CM | POA: Insufficient documentation

## 2023-03-10 DIAGNOSIS — E785 Hyperlipidemia, unspecified: Secondary | ICD-10-CM | POA: Insufficient documentation

## 2023-03-10 DIAGNOSIS — Z7982 Long term (current) use of aspirin: Secondary | ICD-10-CM | POA: Insufficient documentation

## 2023-03-10 DIAGNOSIS — R600 Localized edema: Secondary | ICD-10-CM | POA: Insufficient documentation

## 2023-03-10 DIAGNOSIS — I10 Essential (primary) hypertension: Secondary | ICD-10-CM | POA: Insufficient documentation

## 2023-03-10 DIAGNOSIS — R0789 Other chest pain: Principal | ICD-10-CM | POA: Insufficient documentation

## 2023-03-10 DIAGNOSIS — G40909 Epilepsy, unspecified, not intractable, without status epilepticus: Secondary | ICD-10-CM | POA: Insufficient documentation

## 2023-03-10 DIAGNOSIS — Z90710 Acquired absence of both cervix and uterus: Secondary | ICD-10-CM | POA: Insufficient documentation

## 2023-03-10 DIAGNOSIS — E66813 Obesity, class 3: Secondary | ICD-10-CM | POA: Insufficient documentation

## 2023-03-10 DIAGNOSIS — R208 Other disturbances of skin sensation: Secondary | ICD-10-CM

## 2023-03-10 DIAGNOSIS — R079 Chest pain, unspecified: Secondary | ICD-10-CM | POA: Diagnosis present

## 2023-03-10 DIAGNOSIS — Z9049 Acquired absence of other specified parts of digestive tract: Secondary | ICD-10-CM | POA: Insufficient documentation

## 2023-03-10 DIAGNOSIS — R531 Weakness: Principal | ICD-10-CM | POA: Insufficient documentation

## 2023-03-10 DIAGNOSIS — Z79899 Other long term (current) drug therapy: Secondary | ICD-10-CM | POA: Insufficient documentation

## 2023-03-10 DIAGNOSIS — Z6841 Body Mass Index (BMI) 40.0 and over, adult: Secondary | ICD-10-CM | POA: Insufficient documentation

## 2023-03-10 DIAGNOSIS — R29810 Facial weakness: Secondary | ICD-10-CM | POA: Insufficient documentation

## 2023-03-10 DIAGNOSIS — N390 Urinary tract infection, site not specified: Secondary | ICD-10-CM | POA: Insufficient documentation

## 2023-03-10 DIAGNOSIS — I161 Hypertensive emergency: Secondary | ICD-10-CM | POA: Insufficient documentation

## 2023-03-10 DIAGNOSIS — I160 Hypertensive urgency: Secondary | ICD-10-CM

## 2023-03-10 HISTORY — DX: Chest pain, unspecified: R07.9

## 2023-03-10 HISTORY — DX: Dissociative and conversion disorder, unspecified: F44.9

## 2023-03-10 LAB — LAB USE ONLY - CBC WITH DIFFERENTIAL
Absolute Basophils: 0.04 10*3/uL (ref 0.00–0.08)
Absolute Eosinophils: 0.28 10*3/uL (ref 0.00–0.44)
Absolute Immature Granulocytes: 0.02 10*3/uL (ref 0.00–0.07)
Absolute Lymphocytes: 2.23 10*3/uL (ref 0.42–3.22)
Absolute Monocytes: 0.62 10*3/uL (ref 0.21–0.85)
Absolute Neutrophils: 4.77 10*3/uL (ref 1.10–6.33)
Absolute nRBC: 0 10*3/uL (ref <=0.00)
Basophils %: 0.5 %
Eosinophils %: 3.5 %
Hematocrit: 40.6 % (ref 34.7–43.7)
Hemoglobin: 13.2 g/dL (ref 11.4–14.8)
Immature Granulocytes %: 0.3 %
Lymphocytes %: 28 %
MCH: 30.8 pg (ref 25.1–33.5)
MCHC: 32.5 g/dL (ref 31.5–35.8)
MCV: 94.6 fL (ref 78.0–96.0)
MPV: 13.1 fL — ABNORMAL HIGH (ref 8.9–12.5)
Monocytes %: 7.8 %
Neutrophils %: 59.9 %
Platelet Count: 167 10*3/uL (ref 142–346)
Preliminary Absolute Neutrophil Count: 4.77 10*3/uL (ref 1.10–6.33)
RBC: 4.29 10*6/uL (ref 3.90–5.10)
RDW: 13 % (ref 11–15)
WBC: 7.96 10*3/uL (ref 3.10–9.50)
nRBC %: 0 /100{WBCs} (ref <=0.0)

## 2023-03-10 LAB — BASIC METABOLIC PANEL
Anion Gap: 6 (ref 5.0–15.0)
BUN: 10 mg/dL (ref 7–21)
CO2: 26 meq/L (ref 17–29)
Calcium: 8.9 mg/dL (ref 8.5–10.5)
Chloride: 110 meq/L (ref 99–111)
Creatinine: 0.8 mg/dL (ref 0.4–1.0)
GFR: >60 mL/min/{1.73_m2} (ref >=60.0)
Glucose: 114 mg/dL — ABNORMAL HIGH (ref 70–100)
Potassium: 3.4 meq/L — ABNORMAL LOW (ref 3.5–5.3)
Sodium: 142 meq/L (ref 135–145)

## 2023-03-10 LAB — URINALYSIS WITH REFLEX TO MICROSCOPIC EXAM - REFLEX TO CULTURE
Urine Bilirubin: NEGATIVE
Urine Blood: NEGATIVE
Urine Glucose: NEGATIVE
Urine Ketones: NEGATIVE mg/dL
Urine Leukocyte Esterase: NEGATIVE
Urine Nitrite: POSITIVE — AB
Urine Specific Gravity: 1.029 (ref 1.001–1.035)
Urine Urobilinogen: NORMAL mg/dL (ref 0.2–2.0)
Urine pH: 6 (ref 5.0–8.0)

## 2023-03-10 LAB — LAB USE ONLY - URINE GRAY CULTURE HOLD TUBE

## 2023-03-10 LAB — PT/INR
INR: 1 (ref 0.9–1.1)
PT: 11.2 s (ref 10.1–12.9)

## 2023-03-10 LAB — APTT: PTT: 30 s (ref 27–39)

## 2023-03-10 LAB — WHOLE BLOOD GLUCOSE POCT: Whole Blood Glucose POCT: 111 mg/dL — ABNORMAL HIGH (ref 70–100)

## 2023-03-10 LAB — HIGH SENSITIVITY TROPONIN-I
hs Troponin: 3.5 ng/L (ref <=14.0)
hs Troponin: <2.7 ng/L (ref <=14.0)

## 2023-03-10 LAB — TYPE AND SCREEN
ABO Rh: A POS
Antibody Screen: NEGATIVE

## 2023-03-10 LAB — LAB USE ONLY - GOLD SST HOLD TUBE

## 2023-03-10 LAB — BLOOD TYPE CONFIRMATION: Blood Type Confirmation: A POS

## 2023-03-10 LAB — NT-PROBNP: NT-ProBNP: 56 pg/mL (ref <125)

## 2023-03-10 MED ORDER — ATORVASTATIN CALCIUM 40 MG PO TABS
80.0000 mg | ORAL_TABLET | Freq: Every day | ORAL | Status: DC
Start: 2023-03-10 — End: 2023-03-11
  Administered 2023-03-10 – 2023-03-11 (×2): 80 mg via ORAL
  Filled 2023-03-10: qty 2
  Filled 2023-03-10: qty 1

## 2023-03-10 MED ORDER — NALOXONE HCL 0.4 MG/ML IJ SOLN (WRAP)
0.2000 mg | INTRAMUSCULAR | Status: DC | PRN
Start: 2023-03-10 — End: 2023-03-11

## 2023-03-10 MED ORDER — POTASSIUM & SODIUM PHOSPHATES 280-160-250 MG PO PACK
2.0000 | PACK | ORAL | Status: DC | PRN
Start: 2023-03-10 — End: 2023-03-11

## 2023-03-10 MED ORDER — ASPIRIN 325 MG PO TBEC
325.0000 mg | DELAYED_RELEASE_TABLET | Freq: Every day | ORAL | Status: DC
Start: 2023-03-10 — End: 2023-03-11
  Administered 2023-03-10 – 2023-03-11 (×2): 325 mg via ORAL
  Filled 2023-03-10: qty 1

## 2023-03-10 MED ORDER — LORAZEPAM 2 MG/ML IJ SOLN
1.0000 mg | Freq: Once | INTRAMUSCULAR | Status: AC
Start: 2023-03-10 — End: 2023-03-10
  Administered 2023-03-10: 1 mg via INTRAVENOUS
  Filled 2023-03-10: qty 1

## 2023-03-10 MED ORDER — MELATONIN 3 MG PO TABS
3.0000 mg | ORAL_TABLET | Freq: Every evening | ORAL | Status: DC | PRN
Start: 2023-03-10 — End: 2023-03-11

## 2023-03-10 MED ORDER — DEXTROSE 50 % IV SOLN
12.5000 g | INTRAVENOUS | Status: DC | PRN
Start: 2023-03-10 — End: 2023-03-11

## 2023-03-10 MED ORDER — AMLODIPINE BESYLATE 5 MG PO TABS
10.0000 mg | ORAL_TABLET | Freq: Every day | ORAL | Status: DC
Start: 2023-03-10 — End: 2023-03-11
  Administered 2023-03-10 – 2023-03-11 (×2): 10 mg via ORAL
  Filled 2023-03-10 (×2): qty 2

## 2023-03-10 MED ORDER — HEPARIN SODIUM (PORCINE) 5000 UNIT/ML IJ SOLN
5000.0000 [IU] | Freq: Three times a day (TID) | INTRAMUSCULAR | Status: DC
Start: 2023-03-10 — End: 2023-03-11
  Administered 2023-03-10 – 2023-03-11 (×3): 5000 [IU] via SUBCUTANEOUS
  Filled 2023-03-10 (×3): qty 1

## 2023-03-10 MED ORDER — METHOCARBAMOL 500 MG PO TABS
1000.0000 mg | ORAL_TABLET | Freq: Two times a day (BID) | ORAL | Status: DC
Start: 2023-03-10 — End: 2023-03-11
  Administered 2023-03-10 (×2): 1000 mg via ORAL
  Filled 2023-03-10 (×3): qty 2

## 2023-03-10 MED ORDER — POTASSIUM CHLORIDE 10 MEQ/100ML IV SOLN (WRAP)
10.0000 meq | INTRAVENOUS | Status: DC | PRN
Start: 2023-03-10 — End: 2023-03-11

## 2023-03-10 MED ORDER — HYDROMORPHONE HCL 1 MG/ML IJ SOLN
0.2000 mg | Freq: Four times a day (QID) | INTRAMUSCULAR | Status: DC | PRN
Start: 2023-03-10 — End: 2023-03-11
  Administered 2023-03-10 – 2023-03-11 (×3): 0.2 mg via INTRAVENOUS
  Filled 2023-03-10 (×3): qty 1

## 2023-03-10 MED ORDER — HYDROMORPHONE HCL 1 MG/ML PO LIQD
1.0000 mg | ORAL | Status: DC | PRN
Start: 2023-03-10 — End: 2023-03-11

## 2023-03-10 MED ORDER — GLUCOSE 40 % PO GEL (WRAP)
15.0000 g | ORAL | Status: DC | PRN
Start: 2023-03-10 — End: 2023-03-11

## 2023-03-10 MED ORDER — HYDROCHLOROTHIAZIDE 25 MG PO TABS
25.0000 mg | ORAL_TABLET | Freq: Every day | ORAL | Status: DC
Start: 2023-03-11 — End: 2023-03-11
  Administered 2023-03-11: 25 mg via ORAL
  Filled 2023-03-10: qty 1

## 2023-03-10 MED ORDER — HYDROMORPHONE HCL 2 MG PO TABS
1.0000 mg | ORAL_TABLET | Freq: Once | ORAL | Status: DC
Start: 2023-03-10 — End: 2023-03-10
  Filled 2023-03-10: qty 1

## 2023-03-10 MED ORDER — LISINOPRIL 20 MG PO TABS
20.0000 mg | ORAL_TABLET | Freq: Every day | ORAL | Status: DC
Start: 2023-03-10 — End: 2023-03-11
  Administered 2023-03-10 – 2023-03-11 (×2): 20 mg via ORAL
  Filled 2023-03-10 (×2): qty 1

## 2023-03-10 MED ORDER — CARBOXYMETHYLCELLULOSE SOD PF 0.5 % OP SOLN
1.0000 [drp] | Freq: Three times a day (TID) | OPHTHALMIC | Status: DC | PRN
Start: 2023-03-10 — End: 2023-03-11

## 2023-03-10 MED ORDER — POTASSIUM CHLORIDE CRYS ER 20 MEQ PO TBCR
0.0000 meq | EXTENDED_RELEASE_TABLET | ORAL | Status: DC | PRN
Start: 2023-03-10 — End: 2023-03-11

## 2023-03-10 MED ORDER — GLUCAGON 1 MG IJ SOLR (WRAP)
1.0000 mg | INTRAMUSCULAR | Status: DC | PRN
Start: 2023-03-10 — End: 2023-03-11

## 2023-03-10 MED ORDER — CARVEDILOL 6.25 MG PO TABS
6.2500 mg | ORAL_TABLET | Freq: Two times a day (BID) | ORAL | Status: DC
Start: 2023-03-10 — End: 2023-03-11
  Administered 2023-03-10 – 2023-03-11 (×2): 6.25 mg via ORAL
  Filled 2023-03-10 (×2): qty 1

## 2023-03-10 MED ORDER — BENZONATATE 100 MG PO CAPS
100.0000 mg | ORAL_CAPSULE | Freq: Three times a day (TID) | ORAL | Status: DC | PRN
Start: 2023-03-10 — End: 2023-03-11

## 2023-03-10 MED ORDER — MAGNESIUM SULFATE IN D5W 1-5 GM/100ML-% IV SOLN
1.0000 g | INTRAVENOUS | Status: DC | PRN
Start: 2023-03-10 — End: 2023-03-11

## 2023-03-10 MED ORDER — HYDROCHLOROTHIAZIDE 25 MG PO TABS
25.0000 mg | ORAL_TABLET | Freq: Once | ORAL | Status: AC
Start: 2023-03-10 — End: 2023-03-10
  Administered 2023-03-10: 25 mg via ORAL
  Filled 2023-03-10: qty 1

## 2023-03-10 MED ORDER — SALINE SPRAY 0.65 % NA SOLN
2.0000 | NASAL | Status: DC | PRN
Start: 2023-03-10 — End: 2023-03-11

## 2023-03-10 MED ORDER — POTASSIUM CHLORIDE 20 MEQ PO PACK
0.0000 meq | PACK | ORAL | Status: DC | PRN
Start: 2023-03-10 — End: 2023-03-11

## 2023-03-10 MED ORDER — LIDOCAINE 5 % EX PTCH
1.0000 | MEDICATED_PATCH | CUTANEOUS | Status: DC
Start: 2023-03-10 — End: 2023-03-11

## 2023-03-10 MED ORDER — BENZOCAINE-MENTHOL MT LOZG (WRAP)
1.0000 | LOZENGE | OROMUCOSAL | Status: DC | PRN
Start: 2023-03-10 — End: 2023-03-11

## 2023-03-10 MED ORDER — HYDROMORPHONE HCL 1 MG/ML IJ SOLN
0.4000 mg | Freq: Once | INTRAMUSCULAR | Status: AC
Start: 2023-03-10 — End: 2023-03-10
  Administered 2023-03-10: 0.4 mg via INTRAVENOUS

## 2023-03-10 MED ORDER — LEVETIRACETAM 500 MG PO TABS
500.0000 mg | ORAL_TABLET | Freq: Two times a day (BID) | ORAL | Status: DC
Start: 2023-03-10 — End: 2023-03-11
  Administered 2023-03-10 – 2023-03-11 (×3): 500 mg via ORAL
  Filled 2023-03-10 (×3): qty 1

## 2023-03-10 MED ORDER — DEXTROSE 10 % IV BOLUS
12.5000 g | INTRAVENOUS | Status: DC | PRN
Start: 2023-03-10 — End: 2023-03-11

## 2023-03-10 MED ORDER — HYDROMORPHONE HCL 1 MG/ML IJ SOLN
0.2000 mg | Freq: Four times a day (QID) | INTRAMUSCULAR | Status: DC | PRN
Start: 2023-03-10 — End: 2023-03-10
  Administered 2023-03-10: 0.2 mg via INTRAVENOUS
  Filled 2023-03-10: qty 1

## 2023-03-10 NOTE — EDIE (Signed)
 [facility] NOTIFICATION 03/10/2023 00:16 RACHELLA, BASDEN DOB: 11-03-1975 MRN: [id number]    Criteria Met      3 Different Facilities in 90 Days    5 ED Visits in 12 Months    Security and Safety  No Security Events were found.  ED Care Guidelines  There a

## 2023-03-10 NOTE — ED Notes (Signed)
 Pt ambulatory to 8A for expedited obs admission.

## 2023-03-10 NOTE — ED Notes (Signed)
 Pt tolerated PO medication without issue. Ambulated with steady gait to restroom. Denies dizziness, HA.

## 2023-03-10 NOTE — H&P (Signed)
 ADMISSION HISTORY AND PHYSICAL EXAM    Date Time: 03/10/23 5:47 AM  Patient Name: [name] Cook  Attending Physician: Tiajuana Amass, MD  Primary Care Physician: Unknown Foley, NP    CC: chest pain      Assessment / Plan:   [name] Cook is a 47 y.o.

## 2023-03-10 NOTE — Progress Notes (Signed)
 [facility]  Adult Medicine Hospitalists           Medicine Progress Note    Contact Information:  DAY Hours: 7AM - 6PM, Monday-Sunday   Primary Contact: Epic Chat Medicine Attending     Secondary: PAGE On-Call Hospitalist for [phone]

## 2023-03-10 NOTE — Consults (Signed)
 [facility]  ACUTE STROKE ALERT NOTE                                                                                             Reason for Consultation: Acute Stroke Alert        - R lower facial droop (with R lower lip edema)  - Decreased sensat

## 2023-03-10 NOTE — ED Notes (Signed)
Bed: N 36  Expected date:   Expected time:   Means of arrival:   Comments:  Stroke alert here

## 2023-03-10 NOTE — ED Provider Notes (Signed)
 [facility] EMERGENCY DEPARTMENT  ATTENDING PHYSICIAN HISTORY AND PHYSICAL EXAM     Patient Name: [name] Cook, [name] Cook  Department:[facility] EMERGENCY DEPT  Encounter Date:  03/10/2023  Attending Physician: Tiajuana Amass, MD   Age: 47 y.o. female  Patient Room: N 36/N 54

## 2023-03-10 NOTE — ED to IP RN Note (Signed)
 [facility] HOSPITAL EMERGENCY DEPT  ED NURSING NOTE FOR THE RECEIVING INPATIENT NURSE   [place]    [place] [id number]   ED CHARGE RN Jae Dire [phone]   ADMISSION INFORMATION   [name] Cook is a 47 y.o. female admitted with an ED diagnosis of:    1. Ri

## 2023-03-10 NOTE — Progress Notes (Signed)
 03/10/23 2109   Case Management Quick Doc   Acknowledgment of Outpatient/Observation Observation letter given

## 2023-03-10 NOTE — ED Notes (Signed)
 I have assisted in the ordering of diagnostic studies necessary to expedite care. I am not the primary provider for this patient. Please refer to the provider/reassessment/disposition notes for further information about this patient's emergency department

## 2023-03-10 NOTE — ED Notes (Signed)
 At care handoff, pt symptoms as documented previously, NAD, AOx4, medication being given at this time; breathing is unlabored and even, no changes to neuro exam confirmed by off-going RN at bedside. Clear speech.

## 2023-03-10 NOTE — ED Provider Notes (Shared)
 [facility] EMERGENCY DEPARTMENT  SUBSEQUENT CARE NOTE     Patient Name: [name] Cook, [name] Cook  Department:[facility] EMERG[facility]Y DEPT  Encounter Date:  03/10/2023  Attending Physician:  Rozetta Nunnery, MD  Room:  N 36/N 36  Patient DOB:  1976-02-14  Age: 47 y.o. female  MRN:  [id number]

## 2023-03-11 MED ORDER — HYDROMORPHONE HCL 2 MG PO TABS
2.0000 mg | ORAL_TABLET | Freq: Four times a day (QID) | ORAL | 0 refills | Status: AC | PRN
Start: 2023-03-11 — End: 2023-03-18

## 2023-03-11 MED ORDER — NITROFURANTOIN MONOHYD MACRO 100 MG PO CAPS
100.0000 mg | ORAL_CAPSULE | Freq: Two times a day (BID) | ORAL | Status: DC
Start: 2023-03-11 — End: 2023-03-11
  Administered 2023-03-11: 100 mg via ORAL
  Filled 2023-03-11: qty 1

## 2023-03-11 MED ORDER — LIDOCAINE 5 % EX PTCH
1.0000 | MEDICATED_PATCH | CUTANEOUS | Status: DC
Start: 2023-03-11 — End: 2023-03-11

## 2023-03-11 MED ORDER — ONDANSETRON HCL 4 MG PO TABS
4.0000 mg | ORAL_TABLET | Freq: Three times a day (TID) | ORAL | 0 refills | Status: DC | PRN
Start: 2023-03-11 — End: 2023-05-25

## 2023-03-11 NOTE — Discharge Summary (Signed)
 EXPEDITED OBSERVATION UNIT--  MEDICINE PROGRESS NOTE/DISCHARGE SUMMARY  CONDITIONAL DISCHARGE: No         Date Time: 03/11/23 10:01 AM  Patient Name: [name] Cook  Attending Physician: Jerral Ralph, MD  Primary Care Physician: Unknown Foley, NP

## 2023-03-11 NOTE — Discharge Instructions (Addendum)
 Reason for your Hospital Admission:  Chest pain      Instructions for after your discharge:  -- Schedule a follow up visit with your primary care provider - within 1 week - for an after hospital visit  -- We suggest that you follow up with cardiology at yo

## 2023-03-11 NOTE — PT Progress Note (Signed)
 [facility]   Physical Therapy Cancellation Note      Patient:  [name] Cook MRN#:  [id number]  Unit:  [facility] EMERGENCY DEPT Room/Bed:  8 A/8 A    03/11/2023  Time: 9:18 AM       PT Cancellation: Visit  PT Visit Cancellation Reason: Not

## 2023-03-11 NOTE — Progress Notes (Addendum)
 Adult Observation Progress Note    Shift Note:    General: Patient VSS , in no apparent distress at this time. Able to make needs known  Neuro: Patient is AOx4. Patient denies numbness or tingling. Perrla.   Cardio: bedside monitor on. S1, S2 auscultated.

## 2023-03-11 NOTE — OT Progress Note (Signed)
 [facility]   Occupational Therapy Cancellation Note      Patient:  [name] Cook MRN#:  [id number]  Unit:  [place] EMERGENCY DEPT Room/Bed:  8 A/8 A    03/11/2023  Time: 10:02 AM       OT Cancellation: Order     OT Order Cancellation Verline Lema

## 2023-03-11 NOTE — Progress Notes (Signed)
 ADULT OBSERVATION UNIT  DISCHARGE NOTE        Date Time: 03/11/23  Patient Name: [name] Cook  Attending Physician: Dr. Joni Fears        Date of Admission:   03/10/23           Discharge Instructions:                   Patient stable for discharge. Discharge in

## 2023-03-12 LAB — ECG 12-LEAD
Atrial Rate: 57 {beats}/min
P Axis: 17 degrees
P-R Interval: 126 ms
Q-T Interval: 468 ms
QRS Duration: 84 ms
QTC Calculation (Bezet): 455 ms
R Axis: 23 degrees
T Axis: -15 degrees
Ventricular Rate: 57 {beats}/min

## 2023-03-15 LAB — MUSK ANTIBODY: MuSK Autoantibody: 0 nmol/L (ref 0.00–0.02)

## 2023-03-16 LAB — MYASTHENIA GRAVIS PANEL WITH REFLEX TO MUSK: ACH Receptor Binding AB: 0 nmol/L (ref <=0.02)

## 2023-05-01 NOTE — ED Provider Notes (Signed)
 ED Assumption of Care Note  Created:05/01/2023 - 9:12 AM - [facility]     Diagnostic Studies   ECG Interpretation    Na     K+     Cl     CO2     A-Gap     BUN     Cr     Glu     Glu(P)     Ca     Mg     Phos     AST     ALT     AlkP     TBili     Prot     Alb     Lipase      WBC     Hb     HCT     PLT     MCV     NEU%     NEU     LYM%     LYM     EOS%     EOS     IMMG%     IMMG     BANDS      PT     APTT     INR     DDIMER      U APP     U GLU     U BILI     U KET     U BLD     U pH     U PRO     URO B     U NIT     U LE     U SPG     U WBC     U RBC     U BACT     U SQUAM                    04/30/23  1320 02/03/23  1233 12/12/22  0824 09/18/22  1342 07/18/22  0553   CREATININE 0.7 0.7 0.7 0.9 0.8     I independently reviewed the above laboratory studies and when relevant, compared with previous values if available.   NM Pulmonary Vent + Perfuse Imaging (VQ)  Result Date: 05/01/2023  CLINICAL INDICATION: Shortness of breath TECHNIQUE/FINDINGS: A pulmonary ventilation study was performed using xenon-133 6.7 millicuries. The study demonstrates a slightly heterogeneous pattern of washin, equilibrium distribution and washout of the radioactive xenon gas in both lower lobes. A pulmonary perfusion study was performed following the intravenous administration of technetium 35m MAA 5.2 millicuries. The study demonstrates a mildly heterogeneous pattern of tracer distribution to both lung bases. No segmental perfusion abnormalities are detected.     IMPRESSION: No evidence of pulmonary embolism; there is mildly heterogeneous ventilation and perfusion to both lung bases consistent with bibasilar atelectasis as seen on same day CT scan.     CT Abdomen/Pelvis W/O Con  Result Date: 05/01/2023  INDICATION:  Blunt abdominal trauma after syncope yesterday. TECHNIQUE:  CT of the abdomen and pelvis was performed without intravenous contrast.  Automated mA/kV exposure control was utilized and patient examination was performed in strict  accordance with principles of ALARA. RADIATION AMOUNT:  1440 mGy-cm. COMPARISON:  CT AP 01/04/2021, 07/11/2020, 06/20/2020. FINDINGS: Abdomen: The lung bases show bibasilar atelectasis.  The liver, spleen, pancreas, adrenal glands, and kidneys are suboptimally evaluated on these unenhanced images, but demonstrate no acute pathology.  The gallbladder is absent. There is no free air or lymph node enlargement.  Abdominal aorta is not aneurysmal. Pelvis: There is  no bowel wall thickening or obstruction.  There is no free fluid.  Lymph nodes are not enlarged.  Urinary bladder is unremarkable. The uterus is absent. There is a homogeneous fluid collection with thin wall at the right pelvic sidewall measuring up to 4.2 cm. Skeleton: There are no acute fractures.  No suspicious bony lesions.     IMPRESSION: No findings of an acute process in the abdomen or pelvis.  No acute traumatic finding. Right pelvic sidewall fluid collection, possibly due to a seroma. Thank you for this referral to [facility] , [employer] in partnership with [facility].  For further consultation, please call [phone] or [phone] (phones answered 24/7/365).  Electronically Signed by Alm Devonshire, MD 05/01/2023 5:25 AM EST    XR Chest 2 Views  Result Date: 04/30/2023  INDICATION:  Chest pain TECHNIQUE: PA and lateral views  of the chest COMPARISON: 02/03/2023     FINDINGS/IMPRESSION: Unchanged cardiomediastinal silhouette. No focal pulmonary consolidation. No large pleural effusion. No pneumothorax.     CT Head/Brain W/O Con  Result Date: 04/30/2023  EXAM: CT HEAD/BRAIN W/O CON INDICATION:  Syncope, recurrent  COMPARISON: CT 07/15/2022 TECHNIQUE: Multidetector noncontrast CT imaging of the head with multiplanar reconstructed and reformatted images. This CT scan was performed with one or more of the following dose optimization techniques: iterative reconstruction, automatic exposure control, and/or manual adjustment of  mAs and kVp according to the patient's size. FINDINGS: Brain Parenchyma, Ventricles, Extra-Axial Spaces: No acute cortical infarction, hemorrhage, mass effect, or midline shift. Normal size of the ventricles. No extra-axial fluid collection. Orbits: Normal Paranasal Sinuses: Clear Mastoid Air Cells: Partial right mastoid effusion. Calvarium: Normal Extracranial Soft Tissues: Normal     IMPRESSION: No acute intracranial abnormality.     I independently reviewed the above radiology studies and when relevant, compared with previous studies if available.:        Medications   Medications administered in the ED   morphine  (PF) 4 mg/mL injection 4 mg (4 mg Intravenous Given 05/01/23 0330)   technetium Tc-99m  macro aggregated albumin  (TC-59M MAA) injection 5.2 millicurie (5.2 millicuries Intravenous Given 05/01/23 0900)   xenon Xe-133 10 mCi inhalation 6.7 millicurie (6.7 millicuries Oral Given 05/01/23 0900)            Handoff Notes, Progress Notes, Medical Decision Making and Critical Care   Handoff note from previous shift:  AOA-48 year old female that was transferred here from [place] for VQ scan as her dimer was elevated.  Patient reports that she syncopized, unsure when this was on Sunday.  She denied having chest pain abdominal pain.  She is allergic to contrast.  Dimer was mildly elevated.  CT abdomen pelvis shows a right-sided seroma in the pelvis wall . VQ scan to be done this morning.  Low suspicion for PE.  Patient allergic to multiple pain medications, recent admission at [facility] for dysphagia and strokelike symptoms which was thought to be psychogenic in nature.      LAST  VS BP: (!) 134/91 Pulse: 73 (05/01/23 0333)  Heart Rate (Monitored): (not recorded) Resp: 18  SpO2: 92 % Temp: 37.1 C (98.7 F)   FIRST  VS BP: (!) 168/95 Pulse: 73 (05/01/23 0333)  Heart Rate (Monitored): (not recorded) Resp: 18  SpO2: 94 % Temp: 37.1 C (98.7 F)   SHOCK INDEX BASED ON LATEST VITAL SIGNS (PULSE/SBP): .54  **These vitals  reflect the first/last taken during this encounter. Within each group, they are not necessarily taken at the  same time.    ED Course as of 05/01/23 0913   Tue May 01, 2023   0325 I have called nuclear medicine to help facilitate the VQ scan [AA]   0330 VQ scan will be done in the morning.  Nuclear medicine department.  Around 7 or 8:00 [AA]   9342 Assumed care from Dr. Kris. Had + Ddimer at OSH. Pending VQ scan [MO]   0912 No evidence of pulmonary embolism.  Will discharge patient home [MO]      ED Course User Index  [AA] Carline Emilie Kris, MD  [MO] Eva Lorel Lees, MD               Clinical Impression and Disposition    1. Syncope, unspecified syncope type      ED Disposition: Discharge - 05/01/2023 09:12  Condition at time of disposition: Stable                Eva Lorel Lees, MD  05/01/23 [phone]

## 2023-05-16 NOTE — ED Provider Notes (Signed)
 HISTORY OF PRESENT ILLNESS       History Provided by: patient    Additional History: [name] Cook is a 48 y.o. female with a history of HTN, HLD, seizure, and stroke who presents to the ED with recurrent right-sided facial droop after extensive prior admission workup.  She is also s/p fall 3 days ago with head pain at time of injury, recurrent chest pain status post CT imaging prior and extensive workup as well, and presenting with right sided facial droop.    Patient reports that after the fall, she remembers waking up on the bathroom floor after sitting on the bed. Patient experienced head pain at the time of the fall. Currently, she reports chest pain for the past 2 hours. Patient denies headache, dizziness, or right-sided weakness or biting the tongue. Patient denies any symptoms of seizures upon regaining consciousness after the fall and has been evaluated for this before and does take seizure medication.        REVIEW OF SYSTEMS     Emergent Review of Systems (all others reviewed and negative):  Constitutional: No fevers, no chills.  Eye: No change of vision, no eye pain.  ENMT: no ear pain or sore throat or dysphagia or neck stiffness/swelling  Respiratory: No shortness of breath, no cough, no hemoptysis  Cardiovascular: No Chest pain, no palpitations, no syncope  Gastrointestinal: No nausea, no vomiting, no diarrhea, no bleeding/melena  Genitourinary: No dysuria, no urinary retention.  Hema/Lymph: No bruising tendency, no swollen lymph glands.  Endocrine: No heat intolerance, no cold intolerance.  Integumentary: No rash, no skin lesion.  Neurologic: Right sided facial droop. No headache, no motor weakness, no incontinence  Psychiatric: no suicide or homicidal ideations    PAST HISTORY     Past Medical/Surgical History:   Past Medical History:   Diagnosis Date   . COVID-19 02/2020   . HTN (hypertension)    . Hyperlipidemia    . Seizure    . Stroke     2015 [place] [place]  35month rehab no ongoing  residual from this stroke      Past Surgical History:   Procedure Laterality Date   . APPENDECTOMY     . ESOPHAGOGASTRODUODENOSCOPY N/A 04/03/2023    Procedure: EGD (Esophagogastroduodenoscopy) with biopsy;  Surgeon: Charlanne Everitt Sor, MD;  Location: [facility];  Service: Gastrointestinal;  Laterality: N/A;       Medications:  The patient's home medications section has been reviewed.  Prior to Admission medications    Medication Sig Start Date End Date Taking? Authorizing Provider   aspirin  325 MG EC tablet Take by mouth.   Yes Provider, Historical   atorvastatin  (LIPITOR) 40 MG tablet Take by mouth.   Yes Provider, Historical   hydroCHLOROthiazide  (HYDRODIURIL ) 25 mg tablet Take 1 tablet (25 mg total) by mouth daily.   Yes Provider, Historical   levETIRAcetam  (KEPPRA ) 500 MG tablet Take by mouth 2 (two) times daily.   Yes Provider, Historical       Allergies: Contrast [iohexol], Morphine , Nsaids (non-steroidal anti-inflammatory drug), Percocet [oxycodone -acetaminophen ], Toradol [ketorolac], Tramadol, and Fentanyl     PHYSICAL EXAM   Presents requesting emergency physical exam  Vital Signs: Reviewed the patient's vital signs.   BP 170/86 (BP Location: Left arm, Patient Position: Sitting)   Pulse 76   Temp 36.6 C (97.9 F) (Oral)   Resp 18   Ht 1.778 m (5' 10)   Wt 134.3 kg (296 lb)   SpO2 96%   BMI 42.47  kg/m       Constitutional:  A+0 x3   HEENT: Supple; Nml Conjuctiva/Lids; Nml Oropharynx    Cardiovascular:  Regular rate+rhythm.   WWPx4 with equal pulses. No edema.  Pulmonary/Chest:  Nml effort and speech.  Clear to auscultation bilaterally.    Abdominal:  Soft and non-distended.  There is no tenderness.  No mass.   Skin:  Nml color and temperature.  No dermatomal rash.  Neurological: Right sided facial droop; Sensation Intact Bilaterally; Distal and proximal motor function 5/5 x4; ambulatory without difficulty  Psychiatric:  Nml affect/mood; linear/logical speech; no SI/HI/AH/VH    particularly notable  for NAD and well-appearing,    RESULTS       EKG # 1  Signed and interpreted by the ED physician.   Time Interpreted: 1645   Rate: 84 BPM   Rhythm: NSR,   Blocks: None  Axis: Normal  Intervals: Normal  QRS: Normal QRS  ST Segments: No STEMI   Comparison: No significant change when compared to EKG dated prior.      THISVISIT  Results for orders placed or performed during the hospital encounter of 05/16/23   Comprehensive Metabolic Panel    Collection Time: 05/16/23  4:14 PM   Result Value Ref Range    Sodium 144 135 - 148 mmol/L    Potassium      Chloride 108 98 - 110 mmol/L    Carbon Dioxide 24 21 - 31 mmol/L    Urea Nitrogen 12 7 - 25 mg/dL    Creatinine 9.20 9.49 - 1.20 mg/dL    Glucose 875 (H) 71 - 99 mg/dL    Calcium  9.0 8.4 - 10.5 mg/dL    Total Protein 7.9 6.0 - 8.2 g/dL    Albumin  4.2 3.5 - 5.0 g/dL    Bilirubin,Total 0.5 <=1.2 mg/dL    Alkaline Phosphatase 130 30 - 130 U/L    Aspartate Amino Trans      Alanine Amino Trans      Anion Gap 12 7 - 16 mmol/L    BUN / Creatinine Ratio 15     AST/ALT Ratio      eGFR -CKD-EPI Creatinine (2021) 93 >60 mL/min/1.73 sqm   HCG, Serum, QUALitative    Collection Time: 05/16/23  4:14 PM   Result Value Ref Range    HCG Serum Qual Negative Negative   CBC With Differential    Collection Time: 05/16/23  4:14 PM   Result Value Ref Range    White Blood Cell Count 8.98 4.50 - 11.00 K/cu mm    Red Blood Cell Count 4.58 4.00 - 5.20 M/cu mm    Hemoglobin 14.2 12.0 - 15.0 g/dL    Hematocrit 56.5 63.9 - 46.0 %    Mean Corpuscular Volume 94.8 80.0 - 100.0 fL    Mean Corpus Hgb 31.0 26.0 - 34.0 pg    Mean Corpus Hgb Conc 32.7 31.0 - 37.0 g/dL    RBC Distribution Width 12.9 11.5 - 14.5 %    Platelet Count 177 150 - 350 K/cu mm    Mean Platelet Volume 13.2 (H) 9.2 - 12.7 fL    Nucleated RBC Number 0.00 0.00 - 0.01 K/cu mm    Neutrophil % 63.2 40.0 - 70.0 %    Lymphocytes % 27.8 24.0 - 44.0 %    Monocyte % 6.1 2.0 - 11.0 %    Eosinophil % 2.0 1.0 - 4.0 %    Basophil % 0.6 0.0 - 2.0 %  Immature Gran % 0.3 0.0 - 1.0 %    ANC-Neutrophil Absolute 5.67 1.50 - 7.80 K/cu mm    Lymphocytes Absolute 2.50 1.10 - 4.80 K/cu mm    Monocyte Absolute 0.55 0.10 - 1.20 K/cu mm    Eosinophil Absolute 0.18 0.12 - 0.30 K/cu mm    Immature Granulocytes Abs 0.03 0.00 - 0.05 K/cu mm   Fibrinogen, Quantitative    Collection Time: 05/16/23  4:15 PM   Result Value Ref Range    Fibrinogen, Quantitative 346 150 - 500 mg/dL   Prothrombin Time + INR    Collection Time: 05/16/23  4:15 PM   Result Value Ref Range    Prothrombin Time 10.3 9.5 - 12.3 Seconds    INR, Protime 0.96 0.90 - 1.10   POCT - Interfaced EPOC Panel-Hgb, Electrolytes, Creatinine, and Lactate    Collection Time: 05/16/23  4:17 PM   Result Value Ref Range    POCT Sodium 148.0 (H) 138.0 - 146.0 mmol/L    POCT Potassium 3.9 3.5 - 4.5 mmol/L    POCT Chloride 108 (H) 98.0 - 107.0 mmol/L    POCT Glucose 147.0 (H) 74.0 - 100.0 mg/dL    POCT BUN 13 8 - 26 mg/dL    POCT Creatinine 9.15 0.51 - 1.19 mg/dL    POCT Lactic Acid 8.65 0.56 - 1.39 mmol/L    POCT HCT 48 38.0 - 51.0 %    POCT EPOC CALCULATED HGB 16.3 12 - 17 g/dL   hs-Trop-T 0 hr    Collection Time: 05/16/23  5:12 PM   Result Value Ref Range    hs-Trop-T 0 hr <6 <=14 ng/L   Potassium    Collection Time: 05/16/23  5:12 PM   Result Value Ref Range    Potassium 3.5 3.5 - 5.1 mmol/L   Aspartate Aminotransferase (AST)    Collection Time: 05/16/23  5:12 PM   Result Value Ref Range    Aspartate Amino Trans 23 <=31 U/L   Alanine Aminotransferase (ALT)    Collection Time: 05/16/23  5:12 PM   Result Value Ref Range    Alanine Amino Trans 31 0 - 33 U/L   AST/ALT Ratio    Collection Time: 05/16/23  5:12 PM   Result Value Ref Range    AST/ALT Ratio 0.7        IMAGING  MRI Brain WO Contrast    Result Date: 05/16/2023  EXAM: MRI BRAIN WO CONTRAST INDICATION: r facial droop COMPARISON: MRI/MRI brain from 04/05/2023. TECHNIQUE: MRI of the head/brain acquired without contrast. FINDINGS: Brain: No acute infarction or  intraparenchymal hemorrhage. No mass. No midline shift. Nonspecific few, scattered supratentorial white matter T2/FLAIR hyperintense foci are likely related to microvascular ischemic changes. Ventricles/extra-axial spaces: No hydrocephalus. No extra-axial fluid collection. Intracranial flow voids: Arterial and venous sinus flow voids appear normal. Cranium/scalp: Normal. Other findings including visualized face: Moderate effusion in right mastoid air cells and mild effusion in left mastoid air cells. Mild mucosal thickening in bilateral maxillary sinuses.     IMPRESSION: No acute intracranial abnormality. Mild supratentorial white matter microvascular ischemic changes. Images and interpretation personally reviewed by: Briant Sor, MD    XR Chest AP Only (Portable)    Result Date: 05/16/2023  EXAM: XR CHEST AP ONLY CLINICAL HISTORY: chest pain TECHNIQUE: Single view of the chest COMPARISON: Chest radiographs from 04/01/2023  FINDINGS: Small amount of left lung base atelectasis. Lungs are otherwise clear. No pneumothorax or sizable pleural effusion. Normal cardiac silhouette size. Unremarkable  bones.     IMPRESSION: Minimal left lung base atelectasis. Images and interpretation personally reviewed by: Lonni Bale, MD       MEDICAL DECISION MAKING     Complexity of Problems Addressed: The complexity of the Problems that I addressed included 1 acute illness or injury that posed a threat to life or bodily function (see Clinical Impression)    Differential Diagnoses:   Differentials include, but are not limited to;  Stroke outside of window but no acute changes today  Seizure history but reassuring MRI and already following with neurology  Cranial nerve palsy needing further outpatient neurology testing but not acutely inpatient    Medical Decision Making  Amount and/or Complexity of Data Reviewed  External Data Reviewed: notes.     Details: See ED course.  Labs: ordered. Decision-making details documented in ED  Course.  Radiology: ordered and independent interpretation performed. Decision-making details documented in ED Course.  ECG/medicine tests: ordered and independent interpretation performed. Decision-making details documented in ED Course.    Risk  Prescription drug management.               ED ORDERS  Orders Placed This Encounter   Procedures   . [place] Urine [employer]   . MRI Brain WO Contrast   . XR Chest AP Only (Portable)   . Complete Blood Count (CBC) + Auto Diff   . Comprehensive Metabolic Panel   . Fibrinogen, Quantitative   . Prothrombin Time + INR   . URINALYSIS WITH REFLEX CULTURE   . HCG, Serum, QUALitative   . CBC With Differential   . Urinalysis   . Chemistry Blood Indices   . hs-Trop-T - ED Series (0, 1, 3 hr)   . hs-Trop-T 0 hr   . Potassium   . Aspartate Aminotransferase (AST)   . Alanine Aminotransferase (ALT)   . AST/ALT Ratio   . Chemistry Blood Indices   . Diet NPO - All Routes (do not feed)   . Initiate Patient Triage and Assessment   . POCT Glucose, Whole Blood   . Cardiac Monitor ; Initiate if placed in a monitored bed.   . Nursing Swallow Screening - Prior to Anything PO ( DIet or Meds)   . Oxygen for O2 sats <93%; Discuss initiating with Provider.  If initiated: Titrate to keep O2 sat > 92%.  Notify EDMD of titration changes.   SABRA POCT - Interfaced EPOC Panel-Hgb, Electrolytes, Creatinine, and Lactate   . ECG 12 Lead   . ECG 12 Lead   . Insert Peripheral Saline Lock       ED MEDICATIONS  Medications   LORazepam  (ATIVAN ) injection 1 mg (1 mg Intravenous Given 05/16/23 1729)   LORazepam  (ATIVAN ) 2 mg/mL injection (0 mg  Override Pull 05/16/23 1730)              Re-evaluation:   Time: 1821. ED MD re-evaluation at patient's bedside. The patient is stable and resting comfortably after taking Ativan . Patieny is allergic to all medications except IV Dilaudid  which should not be encouraged as a long-term solution to patient per hospital team on recent notes. Discussed all negative findings again  with patient and she seemed reassured and preliminary receptive to possibility of neurology is concerned that this is a nonanatomic symptom. Patient will follow up with PCP team and neurology as an outpatient for further testing and monitoring.  Her EKG is noted to be similar as prior. High sensitivity Troponin is negative as  well and patient is PERC negative and patient agreeable with plan for discharge.     There was a potential risk of morbidity, as evidenced by my testing, treatment, and decision-making regarding the need to escalate to a hospital level of care.  I considered hospitalization based on this patient's clinical presentation, testing, and response to treatment.  My initial differential included stroke outside of window, seizure history, and cranial nerve palsy as possible diagnoses and after further review and re-assessment, I decided to discharge this patient.        Additional MDM and Provider Notes: Labs and imaging reviewed and interpreted as reassuring          IMPRESSION AND DISPOSITION      CLINICAL IMPRESSION   1. Facial droop        Disposition: Discharged  Patient condition: Stable    COUNSELING     Counseling: The emergency provider discussed today's findings and diagnosis with the patient, along with the plan of care.  Questions were answered and there is agreement with the plan.     SCRIBE ATTESTATION     This note is prepared by Fredia Stank acting as Scribe in the presence of Tanda Cough, MD.   7:13 PM,  05/16/2023  Fredia Stank, ED Scribe    Cough Tanda, MD: The scribe's documentation has been prepared under my direction and personally reviewed by me in its entirety.  I confirm that the note above accurately reflects all work, treatment, procedures, and medical decision making performed by me. 7:13 PM 05/16/2023    Note has been documented by Fredia Stank on 05/16/2023     Tanda Cough, MD  05/16/23 2256

## 2023-05-25 ENCOUNTER — Emergency Department

## 2023-05-25 ENCOUNTER — Emergency Department
Admission: EM | Admit: 2023-05-25 | Discharge: 2023-05-25 | Disposition: A | Discharge status: Home / Self Care | Attending: Emergency Medicine | Admitting: Emergency Medicine

## 2023-05-25 DIAGNOSIS — R29810 Facial weakness: Secondary | ICD-10-CM | POA: Insufficient documentation

## 2023-05-25 DIAGNOSIS — R079 Chest pain, unspecified: Secondary | ICD-10-CM

## 2023-05-25 LAB — COMPREHENSIVE METABOLIC PANEL
ALT: 38 U/L (ref <=55)
AST (SGOT): 28 U/L (ref <=41)
Albumin/Globulin Ratio: 0.9 (ref 0.9–2.2)
Albumin: 3.8 g/dL (ref 3.5–5.0)
Alkaline Phosphatase: 118 U/L — ABNORMAL HIGH (ref 37–117)
Anion Gap: 7 (ref 5.0–15.0)
BUN: 10 mg/dL (ref 7–21)
Bilirubin, Total: 0.7 mg/dL (ref 0.2–1.2)
CO2: 25 meq/L (ref 17–29)
Calcium: 8.9 mg/dL (ref 8.5–10.5)
Chloride: 108 meq/L (ref 99–111)
Creatinine: 0.7 mg/dL (ref 0.4–1.0)
GFR: >60 mL/min/{1.73_m2} (ref >=60.0)
Globulin: 4.2 g/dL — ABNORMAL HIGH (ref 2.0–3.6)
Glucose: 229 mg/dL — ABNORMAL HIGH (ref 70–100)
Potassium: 4 meq/L (ref 3.5–5.3)
Protein, Total: 8 g/dL (ref 6.0–8.3)
Sodium: 140 meq/L (ref 135–145)

## 2023-05-25 LAB — LAB USE ONLY - CBC WITH DIFFERENTIAL
Absolute Basophils: 0.04 10*3/uL (ref 0.00–0.08)
Absolute Eosinophils: 0.11 10*3/uL (ref 0.00–0.44)
Absolute Immature Granulocytes: 0.01 10*3/uL (ref 0.00–0.07)
Absolute Lymphocytes: 1.98 10*3/uL (ref 0.42–3.22)
Absolute Monocytes: 0.41 10*3/uL (ref 0.21–0.85)
Absolute Neutrophils: 4.7 10*3/uL (ref 1.10–6.33)
Absolute nRBC: 0 10*3/uL (ref <=0.00)
Basophils %: 0.6 %
Eosinophils %: 1.5 %
Hematocrit: 41.5 % (ref 34.7–43.7)
Hemoglobin: 13.7 g/dL (ref 11.4–14.8)
Immature Granulocytes %: 0.1 %
Lymphocytes %: 27.3 %
MCH: 31.3 pg (ref 25.1–33.5)
MCHC: 33 g/dL (ref 31.5–35.8)
MCV: 94.7 fL (ref 78.0–96.0)
MPV: 13.4 fL — ABNORMAL HIGH (ref 8.9–12.5)
Monocytes %: 5.7 %
Neutrophils %: 64.8 %
Platelet Count: 152 10*3/uL (ref 142–346)
Preliminary Absolute Neutrophil Count: 4.7 10*3/uL (ref 1.10–6.33)
RBC: 4.38 10*6/uL (ref 3.90–5.10)
RDW: 13 % (ref 11–15)
WBC: 7.25 10*3/uL (ref 3.10–9.50)
nRBC %: 0 /100{WBCs} (ref <=0.0)

## 2023-05-25 LAB — HIGH SENSITIVITY TROPONIN-I WITH DELTA
hs Troponin-I Delta: 1
hs Troponin: 4 ng/L (ref <=14.0)

## 2023-05-25 LAB — WHOLE BLOOD GLUCOSE POCT: Whole Blood Glucose POCT: 203 mg/dL — ABNORMAL HIGH (ref 70–100)

## 2023-05-25 LAB — ECG 12-LEAD
Atrial Rate: 83 {beats}/min
IHS MUSE NARRATIVE AND IMPRESSION: NORMAL
P Axis: 42 degrees
P-R Interval: 152 ms
Q-T Interval: 410 ms
QRS Duration: 86 ms
QTC Calculation (Bezet): 481 ms
R Axis: 3 degrees
T Axis: 19 degrees
Ventricular Rate: 83 {beats}/min

## 2023-05-25 LAB — SERUM HCG, QUALITATIVE: hCG Qualitative: NEGATIVE

## 2023-05-25 LAB — HIGH SENSITIVITY TROPONIN-I: hs Troponin: 3.2 ng/L (ref <=14.0)

## 2023-05-25 MED ORDER — LORAZEPAM 2 MG/ML IJ SOLN
1.0000 mg | Freq: Once | INTRAMUSCULAR | Status: AC
Start: 2023-05-25 — End: 2023-05-25
  Administered 2023-05-25: 1 mg via INTRAVENOUS
  Filled 2023-05-25: qty 1

## 2023-05-25 MED ORDER — DIPHENHYDRAMINE HCL 50 MG/ML IJ SOLN
25.0000 mg | Freq: Once | INTRAMUSCULAR | Status: AC
Start: 2023-05-25 — End: 2023-05-25
  Administered 2023-05-25: 25 mg via INTRAVENOUS
  Filled 2023-05-25: qty 1

## 2023-05-25 NOTE — Consults (Addendum)
 Pt seen full to follow   Reese GORMAN Erps, MD  NEUROLOGY CONSULTATION    Date Time: 05/25/23 5:08 PM  Patient Name: [name] Cook  Attending Physician: Willy Leita BROCKS, DO      Assessment & Plan:   Facial asymmetry pulling sensation to the right side of the face, not sure about cause?  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-sided Bell's palsy  Prior history of stroke  Prior history of seizures  Obesity  Recurrent events of syncope been found on the floor----Not sure about etiology  Brain MRI to be repeated to make sure there is no new findings which I doubt, this is negative can be discharged with referral to tertiary care center, maybe they can administer Botox given significant asymmetry of the facial musculature noted on exam  Should not drive given above events  Please call if further questions      Addendum, Brain MRI reviewed, DWI negative FLAIR also negative I do not see any large territorial infarct, to suggest a prior CVA, etiology of patient's symptoms remains unclear, multiple physicians in the past have thought that the patient may have functional disorder, but her exam definitely seems to be abnormal today, with significant deepened   nasolabial fold groove on the right side of the face and the chin    History of Present Illness:   Neurology consultation requested by:-->Dr Patitucci 48 year old lady, comes into the hospital worsening right-sided facial pulling sensation and drooping.  She has had similar events multiple times in the past I have seen her in the past as well, for similar symptoms at that time actually did a lumbar puncture as well which was negative etiology of her symptoms remains unclear    She is also been having syncope-like events where she is found on the floor, incontinent, there is a  reported history of seizures in the past, not sure if these are actual seizures or syncope or something else given prior documentation of conversion disorder, and functional disorder as well    Past Medical History:   Medical History[1]    Meds:   Aspirin  Lipitor hydrochlorothiazide  Keppra  Zestril  Norvasc       Allergies[2]    Social & Family History:   @SOC @  Family History[3]        CODE STATUS:   I personally reviewed all of the medications.  Medication list generated using all available resources.  Elder abuse (physical)  - negative  Advanced care plan - reviewed from chart or in discussion with pt or family    Review of Systems:   No headache, eye, ear nose, throat problems; no coughing or wheezing or shortness of breath, No chest pain or orthopnea, no abdominal pain, nausea or vomiting, No pain in the body or extremities, no psychiatric, neurological, endocrine, hematological or cardiac complaints except as noted above.        Physical Exam:   Blood pressure 182/86, pulse 87, temperature 98.5 F (36.9 C), temperature source Oral, resp. rate 20, weight (!) 154 kg (339 lb 8.1 oz), last menstrual period 10/15/2018, SpO2 97%.    HEENT: Normocephalic.no carotid bruits  Lungs:  CTA bil  Abd Soft   Cardiac:  S1,S2, normal rate and rhythm  Neck: supple, no cartoid bruits  Extremities: no edema  Skin: no rashes seen in exposed  areas     Neuro:  Level of consciousness:  Alert and appropriate  Oriented:  X 3  Cognition:  Intact naming, recognition, concentration and following complex commands  Cranial Nerves:  II-XII intact increased  prominence nasolabial fold some hypertrophy of the musculature on the right side of the face and the chin tongue midline, reduced nasolabial fold on the left moves both arms and legs equally  Strength:  No upper extremity drift, 5/5 strength x 4 extremities  Coordination:  Intact FTN testing  Reflexes:  +2 throughout, down going toes bil  Sensation: Intact x 4 extremities to LT, temp,      Labs:     Recent Labs   Lab 05/25/23  1237   Glucose 229*   BUN 10   Creatinine 0.7   Calcium  8.9   Sodium 140   Potassium 4.0   Chloride 108   CO2 25   Albumin  3.8   AST (SGOT) 28   ALT 38   Bilirubin, Total 0.7   Alkaline Phosphatase 118*     Recent Labs   Lab 05/25/23  1237   WBC 7.25   Hemoglobin 13.7   Hematocrit 41.5   MCV 94.7   MCH 31.3   MCHC 33.0   Platelet Count 152         No results for input(s): PTT, PT, INR in the last 72 hours.       Radiology Results (24 Hour)       Procedure Component Value Units Date/Time    MRI Brain WO Contrast [[id number]] Collected: 05/25/23 1632    Order Status: Completed Updated: 05/25/23 1642    Narrative:      HISTORY: facial droop.     COMPARISON: Multiple prior brain imagings, including MRI of the brain  03/10/23    TECHNIQUE: MRI of the brain performed on a 1.5 Tesla scanner without  intravenous contrast. Examination performed per epilepsy protocol with high  resolution coronal images through the temporal lobes.     CONTRAST: None.    FINDINGS:   Images through the temporal lobes demonstrate no abnormalities. The  hippocampi and parahippocampal structures are unremarkable, with symmetric  morphology and signal intensity. The forniceal columns and mammillary  bodies are unremarkable. No focal temporal lobe atrophy is noted. There is  a faint focus of increased signal on DWI in the right hippocampus (series 2  image 13), without corresponding signal abnormality on T2/FLAIR, presumed  to be volume averaging.    There is no acute hemorrhage or acute infarct.   There is no mass effect or midline shift. The ventricles and extra-axial  spaces are appropriate for age.   There are scattered punctate foci of T2/FLAIR hyperintensity in the deep  and subcortical white matter of bilateral cerebral hemisphere.   There is usual flow void appearance in the major arteries and veins.  There is partially empty sella. The cerebellar tonsils are above the  foramen magnum.   There  are no suspicious calvarial or skull base lesions. The orbits are  unremarkable.   The visualized paranasal sinuses are clear.  Scattered effusions in  bilateral mastoid air cells.      Impression:         1.No acute intracranial abnormality.  2.No evidence of mesial temporal sclerosis.  3.Scattered punctate foci of T2/FLAIR hyperintensity in the deep and  subcortical white matter of bilateral cerebral hemisphere. These are  nonspecific, but most commonly seen in the setting of chronic small vessel  ischemic changes.  4.Partially empty sella.    Oakley ONEIDA Miyamoto, MD  05/25/2023 4:40 PM    XR Chest  AP Portable [[id number]] Collected: 05/25/23 1307    Order Status: Completed Updated: 05/25/23 1315    Narrative:      HISTORY: Chest pain    COMPARISON: 01/05/2023    FINDINGS:  A single radiograph of the chest performed. Portable film.  The heart is at the upper limit normal in size.   The mediastinal and hilar structures are within normal limits.  The lung fields are clear. No pleural effusion.  No pneumothorax.    The  visualized osseous structures demonstrates no acute abnormality.      Impression:       No active disease     Barnie Lamp, MD  05/25/2023 1:13 PM             All recent brain and spine imaging (MRI, CT) personally reviewed.    Chart reviewed    Case discussed with: Patient, ED attending    TTS ; > Time spent examining patient, in counseling or coordination of care, reviewing test results, and in documentation. 50% time spent in counseling or coordination of care        This note was generated by the Epic EMR system/Speech recognition and may contain inherent errors or omissions not intended by the user. Grammatical errors, random word insertions, deletions and pronoun errors  are occasional consequences of this technology due to software limitations.   Not all errors are caught or corrected. If there are questions or concerns about the content of this note or information contained within the  body of this dictation they should be addressed directly with the author for clarification.    Signed by: Reese GORMAN Erps, MD, MD  Spectralink: [phone]      Answering Service: [phone]             [1]   Past Medical History:  Diagnosis Date    Chest pain     h/o multiple ED visits and admissions for chest pain    Conversion disorder     per H&P on 06/18/2021    Hyperlipidemia     Hypertension     Migraine     Morbid obesity (CMS/HCC)     Palpitations     loop recorder placed in 2019, had to be removed after a month due to misplacement and keloid formation per patient    Seizures (CMS/HCC)     Stroke (CMS/HCC)     Multiple admissions since 2015 for stroke-like symptoms (facial droop, paresthesias). No MRI confirmed acute stroke.    Thoracic aortic aneurysm     4.1 cm on MRA chest on 07/20/2022   [2]   Allergies  Allergen Reactions    Contrast [Iodinated Contrast Media] Anaphylaxis     Patient woke up in ICU after having CT with contrast, last remembers being in CT    Fentanyl  Anaphylaxis     Throat swelling    Fioricet [Butalbital-Apap-Caffeine] Hives    Flexeril  [Cyclobenzaprine ] Edema     Causes throat swelling per Pt report    Motrin [Ibuprofen] Anaphylaxis     Throat swelling    Tramadol Anaphylaxis     Throat swelling    Bentyl  [Dicyclomine ] Edema    Percocet [Oxycodone -Acetaminophen ] Hives    Shellfish-Derived Products Hives    Toradol [Ketorolac Tromethamine] Hives    Tylenol  [Acetaminophen ] Hives   [3]   Family History  Problem  Relation Name Age of Onset    Myocardial Infarction Father  83    Deep vein thrombosis Father      No known problems Mother

## 2023-05-25 NOTE — EDIE (Signed)
 [facility] NOTIFICATION 05/25/2023 11:44 [name] Cook, [name] Cook DOB: August 14, 1975 MRN: [id number]    [name] encounter information:   FMW:?[id number]  Account [id number]  Billing Account [id number]      Criteria Met      3 Different Facilities in 90 Days    5 ED Visits in 12 Months    Security and Safety  No Security Events were found.  ED Care Guidelines  There are currently no ED Care Guidelines for this patient. Please check your facility's medical records system.        Prescription Monitoring Program  Narx Score not available at this time.    E.D. Visit Count (12 mo.)  Facility Visits   [name] 8   [place] [facility] 5   [facility] 3   Total 16   Note: Visits indicate total known visits.     Recent Emergency Department Visit Summary  Showing 10 most recent visits out of 16 in the past 12 months   Date Facility [facility] Type Diagnoses or Chief Complaint    May 25, 2023  [facility]  [name]  [place]  Emergency      Facial Drop; Chest Pain; Blurred Vision      Facial Drop; Chest Pain      May 01, 2023  [facility] [name]  [place]  Emergency      Syncope and collapse      Fall      May 01, 2023  [facility] [name]  MD  Emergency      Fall      Apr 30, 2023  [facility] [name]  MD  Emergency      Syncope and collapse      Chest pain, unspecified      Right upper quadrant pain      Syncope - Loss of consciousness      Pain, Chest      Chest pain, shortness of breth      Mar 10, 2023  [facility]  [place]  [name]  Emergency      Essential (primary) hypertension      Weakness      Chest pain, unspecified      Chest Pain      Abdominal Pain      Facial Droop      Triage      Feb 03, 2023  [name] [name]  MD  Emergency      Essential (primary) hypertension      Chest pain, unspecified      Acute cough      Shortness of breath      Shortness Of Breath      Pain, Chest      chest  pain, SOB      Jan 05, 2023  [name]  [name]  [name]  Emergency      Facial weakness      R Side Facial Droop, Chest Pain      R Facial Droop, Chest Pain      Dec 19, 2022  [place] [name]  [name]  [name]  Emergency      Lower abdominal pain, unspecified      Hypertensive urgency      Abdominal Pain      chest pain  Dec 12, 2022  [name] [name]  [place]  Emergency      Shortness of breath      Other specified abnormal findings of blood chemistry      Chest pain, unspecified      Pain, Chest      Shortness Of Breath      chesty pains,back pain,dizzy,nausea      Oct 30, 2022  [place] [name]  [name]  [name]  Emergency      Shortness of Breath      Chest Pain      chest pains, shortness of breath        Recent Inpatient Visit Summary  Date Facility [facility] Type Diagnoses or Chief Complaint    Oct 30, 2022  [name] [name]  [name]  [place]  Medical Surgical      Facial weakness      Nontraumatic intracerebral hemorrhage, unspecified        Care Team  No Care Team was found.  [facility]  This patient has registered at the [facility] Emergency Department  For more information visit: [email or web address]     PLEASE NOTE:     1.   Any care recommendations and other clinical information are provided as guidelines or for historical purposes only, and providers should exercise their own clinical judgment when providing care.    2.   You may only use this information for purposes of treatment, payment or health care operations activities, and subject to the limitations of applicable [facility] Policies.    3.   You should consult directly with the organization that provided a care guideline or other clinical history with any questions about additional information or accuracy or completeness of information provided.    ? 2025 [facility] - [email or web address]

## 2023-05-25 NOTE — Discharge Instructions (Signed)
 Please follow up with your primary care doctor in 1-2 days regarding your visit to the emergency department. If you develop any worsening of symptoms please contact your physician or return to the ER for reevaluation!

## 2023-05-25 NOTE — ED Provider Notes (Signed)
 EMERGENCY DEPARTMENT NOTE     Patient initially seen and examined at   ED PHYSICIAN ASSIGNED       Date/Time Event User Comments    05/25/23 1205 Physician Assigned Vallie Fayette C. Baylynn Shifflett C, DO assigned as Attending           ED MIDLEVEL (APP) ASSIGNED       None            HISTORY OF PRESENT ILLNESS       Chief Complaint: Facial Droop       48 y.o. female with past medical history as below presents with facial droop to right lower face. She states she noticed it yesterday in the morning. Last known normal yesterday at 0530. Has had facial droop off and on for greater than a year. Comes and goes without pattern. Has been seen for this multiple times and had negative neuro imaging. Last MRI 3/5 which was normal. Denies changes in gait vision or speech. Feels slight weakness on right side of body. Also complains of chest pain which started yesterday. Non pleuritic and non exertional. Denies sob, fevers, headache, nausea, vomtiing, abd pain.     Independent Historian (other than patient): No  Additional History Provided by Independent Historian:  MEDICAL HISTORY     Past Medical History:  Past Medical History:   Diagnosis Date    Chest pain     h/o multiple ED visits and admissions for chest pain    Conversion disorder     per H&P on 06/18/2021    Hyperlipidemia     Hypertension     Migraine     Morbid obesity (CMS/HCC)     Palpitations     loop recorder placed in 2019, had to be removed after a month due to misplacement and keloid formation per patient    Seizures (CMS/HCC)     Stroke (CMS/HCC)     Multiple admissions since 2015 for stroke-like symptoms (facial droop, paresthesias). No MRI confirmed acute stroke.    Thoracic aortic aneurysm     4.1 cm on MRA chest on 07/20/2022       Past Surgical History:  Past Surgical History[1]    Social History:  @SOC @    Family History:  Family History[2]    Outpatient Medication:  Discharge Medication List as of 05/25/2023  5:09 PM        CONTINUE these medications  which have NOT CHANGED    Details   aspirin  EC 325 MG tablet Take 1 tablet (325 mg) by mouth daily, Historical Med      atorvastatin  (LIPITOR) 80 MG tablet Take 1 tablet (80 mg) by mouth daily, Historical Med      hydroCHLOROthiazide  (HYDRODIURIL ) 25 MG tablet Take 1 tablet (25 mg) by mouth daily, Historical Med      LevETIRAcetam  (KEPPRA  PO) Take 500 mg by mouth 2 (two) times daily.   , Historical Med      lisinopril  (ZESTRIL ) 20 MG tablet Take 1 tablet (20 mg) by mouth daily, Historical Med               REVIEW OF SYSTEMS   Review of Systems See History of Present Illness  PHYSICAL EXAM     ED Triage Vitals   Encounter Vitals Group      BP 05/25/23 1152 168/88      Systolic BP Percentile --       Diastolic BP Percentile --  Heart Rate 05/25/23 1152 85      Resp Rate 05/25/23 1152 18      Temp 05/25/23 1152 98.5 F (36.9 C)      Temp src 05/25/23 1152 Oral      SpO2 05/25/23 1152 97 %      Weight 05/25/23 1152 (!) 154 kg      Height --       Head Circumference --       Peak Flow --       Pain Score 05/25/23 1158 6      Pain Loc --       Pain Education --       Exclude from Growth Chart --      Physical Exam   Vital Signs and Nursing notes reviewed.    Constitutional: well appearing, well hydrated, non toxic, comfortable  HEENT: NC/AT, moist mucus membranes, oropharynx clear, nose normal, Pupils equal and reactive, EOMI  Neck: Supple, non tender, no bony or midline tenderness, full ROM, no meningeal signs  Pulmonary: CTAB, no wheezes rales or rhonchi. Normal work of breathing. No respiratory distress.  Cardiovascular: Regular Rate and Rhythm. No murmurs, rubs, or gallops.   Abdomen: Soft, non tender, non distended. No rebound, guarding, or rigidity  Extremities: no deformities, no edema, normal ROM, compartments soft, neurovascularly intact throughout  Skin: normal, no rash, lesions, wounds, edema or color change  Neuro: right facial droop to lower face. 5/5 muscle strength in all extremities. Negative  pronator drift. Equal grip strength. Normal speech. AAOx3  Psych: normal mood and affect       MEDICAL DECISION MAKING     PRIMARY PROBLEM LIST      Acute illness/injury with risk to life or bodily function (based on differential diagnosis or evaluation) DIAGNOSIS:facial droop     Differential Diagnosis: Neurological deficit:  CVA, seizure, neuropathy, Bells Palsy    DISCUSSION      48 year old with right facial droop intermittnetly for greater than a year. Has had negative neuro imaging recently. States today it is different in that it is lasting longer than normal. Patient seen and evaluated by dr edd in ER - thinks this may be dystonic muscle issue. Wants MRI brain. MRI brain negative for acute infarcts. Will plan to [place] with neuro follow up.     If patient is being hospitalized is severe sepsis or septic shock suspected?: N/A              Was management discussed with a consultant?: Yes (explain)khsola  Was the decision around the need for surgery discussed with consultant: N/A  External Records Reviewed?: [place]  Prescription Drug Monitor Reviewed, and no concerning prescriptions noted.    Additional Notes              ED Course as of 05/25/23 2029   Fri May 25, 2023   1318 Pt evaluated by Dr Edd. Rec MRI Brain W/WO contrast, premedicate with 25mg  IV benadryl  [MM]   1647 IMPRESSION:      1.No acute intracranial abnormality.  2.No evidence of mesial temporal sclerosis.  3.Scattered punctate foci of T2/FLAIR hyperintensity in the deep and  subcortical white matter of bilateral cerebral hemisphere. These are  nonspecific, but most commonly seen in the setting of chronic small vessel  ischemic changes.  4.Partially empty sella.   [LP]   1708 Dr edd thinks this is likely not a central process but recommends MRI.     MRI resulted as WNL.  Will recommend neuro outpatient follow up.   [LP]   1714 Updated patient on results. Will plan to [place] to home with neuro follow up.  [LP]      ED Course User Index  [LP]  Diania Co, Leita BROCKS, DO  [MM] Missana, Mae L, PA     NIH Stroke Score      Flowsheet Row Most Recent Value   NIH Stroke Scale    Interval Baseline admission to ED   1a. Level of Consciousness 0   1b. LOC Questions (age, month) 0   1c. LOC Commands (Open and close eyes, Grip AND release good hand) 0   2. Best Gaze 0   3. Visual Fields 1  [rightside of right eye]   4. Facial Palsy 2  [right]   5a. Motor Left Arm: (Arms with palm down X 10 seconds. Sitting = arms at 90 degrees. Supine = arms 45 degrees) 0   5b. Motor Right Arm: (Arms with palm down X 10 seconds. Sitting = arms at 90 degrees Supine = arms 45 degrees) 1   Total Motor Arms 1   6a. Motor Left Leg: (Leg elevated X 5 seconds Supine = Leg 30 degrees) 0   6b. Motor Right Leg: (Leg elevated X 5 seconds Supine = Leg 30 degrees) 2   Total Motor Legs 2   7. Limb Ataxia (Finger to nose, heel to shin) 0   8. Sensory (Sensation or grimace to pin prick on face, arm, trunk, and leg) 1   9. Best language (Describe picture, name items, read sentences from NIHSS booklet) 0   10. Dysarthria (Read list from NIHSS booklet) 0   11. Extinction and Inattention (formerly Neglect) - Test tactile and visual stimulation 0   NIHSS Total 7            Vital Signs: Reviewed the patient's vital signs.   Nursing Notes: Reviewed and utilized available nursing notes.  Medical Records Reviewed: Reviewed available past medical records.  Counseling: The emergency provider has spoken with the patient and discussed today's findings, in addition to providing specific details for the plan of care.  Questions are answered and there is agreement with the plan.      CARDIAC STUDIES    The following cardiac studies were independently interpreted by me the Emergency Medicine Provider.  For full cardiac study results please see chart.              EKG 1 interpreted by me (ED provider)  Comparison: None available  Time Interpreted:  Rate: 60-100  Rhythm: Normal Sinus Rhythm  ST segments: No acute  changes  STEMI?: NO  EKG interpretation: Normal                      RADIOLOGY IMAGING STUDIES      MRI Brain WO Contrast   Final Result       1.No acute intracranial abnormality.   2.No evidence of mesial temporal sclerosis.   3.Scattered punctate foci of T2/FLAIR hyperintensity in the deep and   subcortical white matter of bilateral cerebral hemisphere. These are   nonspecific, but most commonly seen in the setting of chronic small vessel   ischemic changes.   4.Partially empty sella.      [place] ONEIDA Miyamoto, MD   05/25/2023 4:40 PM      XR Chest  AP Portable   Final Result    No active disease  Barnie Lamp, MD   05/25/2023 1:13 PM          EMERGENCY IMAGING STUDIES    The following imagine studies were independently interpreted by me (emergency medicine provider):    Chest Xray Interpreted by me (ED provider) SIDE : N/A  Comparison: None available  RESULT: No infiltrate. No pneumothorax. No large hemothorax. No CHF.  IMPRESSION: No acute abnormality              EMERGENCY DEPT. MEDICATIONS      ED Medication Orders (From admission, onward)      Start Ordered     Status Ordering Provider    05/25/23 1401 05/25/23 1400  LORazepam  (ATIVAN ) injection 1 mg  Once        Route: Intravenous  Ordered Dose: 1 mg       Last MAR action: Given Ziyah Cordoba C    05/25/23 1318 05/25/23 1317  diphenhydrAMINE  (BENADRYL ) injection 25 mg  Once        Route: Intravenous  Ordered Dose: 25 mg       Last MAR action: Given MISSANA, MAE L            LABORATORY RESULTS    Ordered and independently interpreted AVAILABLE laboratory tests.   Results       Procedure Component Value Units Date/Time    High Sensitivity Troponin-I at 2 hrs with calculated Delta [[id number]] Collected: 05/25/23 1453    Specimen: Blood, Venous Updated: 05/25/23 1535     hs Troponin 4.0 ng/L      hs Troponin-I Delta 1    Serum Beta HCG Qualitative [[id number]]  (Normal) Collected: 05/25/23 1237    Specimen: Blood, Venous Updated: 05/25/23 1422     hCG  Qualitative Negative    High Sensitivity Troponin-I at 0 hrs [[id number]]  (Normal) Collected: 05/25/23 1237    Specimen: Blood, Venous Updated: 05/25/23 1309     hs Troponin 3.2 ng/L     Comprehensive Metabolic Panel [[id number]]  (Abnormal) Collected: 05/25/23 1237    Specimen: Blood, Venous Updated: 05/25/23 1305     Glucose 229 mg/dL      BUN 10 mg/dL      Creatinine 0.7 mg/dL      Sodium 859 mEq/L      Potassium 4.0 mEq/L      Chloride 108 mEq/L      CO2 25 mEq/L      Calcium  8.9 mg/dL      Anion Gap 7.0     GFR >60.0 mL/min/1.73 m2      AST (SGOT) 28 U/L      ALT 38 U/L      Alkaline Phosphatase 118 U/L      Albumin  3.8 g/dL      Protein, Total 8.0 g/dL      Globulin 4.2 g/dL      Albumin /Globulin Ratio 0.9     Bilirubin, Total 0.7 mg/dL     CBC with Differential (Order) [[id number]]  (Abnormal) Collected: 05/25/23 1237    Specimen: Blood, Venous Updated: 05/25/23 1246    Narrative:      The following orders were created for panel order CBC with Differential (Order).  Procedure                               Abnormality         Status                     ---------                               -----------         ------  CBC with Differential (...[[id number]]  Abnormal            Final result                 Please view results for these tests on the individual orders.    CBC with Differential (Component) [[id number]]  (Abnormal) Collected: 05/25/23 1237    Specimen: Blood, Venous Updated: 05/25/23 1246     WBC 7.25 x10 3/uL      Hemoglobin 13.7 g/dL      Hematocrit 58.4 %      Platelet Count 152 x10 3/uL      MPV 13.4 fL      RBC 4.38 x10 6/uL      MCV 94.7 fL      MCH 31.3 pg      MCHC 33.0 g/dL      RDW 13 %      nRBC % 0.0 /100 WBC      Absolute nRBC 0.00 x10 3/uL      Preliminary Absolute Neutrophil Count 4.70 x10 3/uL      Neutrophils % 64.8 %      Lymphocytes % 27.3 %      Monocytes % 5.7 %      Eosinophils % 1.5 %      Basophils % 0.6 %      Immature Granulocytes % 0.1 %      Absolute  Neutrophils 4.70 x10 3/uL      Absolute Lymphocytes 1.98 x10 3/uL      Absolute Monocytes 0.41 x10 3/uL      Absolute Eosinophils 0.11 x10 3/uL      Absolute Basophils 0.04 x10 3/uL      Absolute Immature Granulocytes 0.01 x10 3/uL     Whole Blood Glucose POCT [[id number]]  (Abnormal) Collected: 05/25/23 1200    Specimen: Blood, Capillary Updated: 05/25/23 1204     Whole Blood Glucose POCT 203 mg/dL               CRITICAL CARE/PROCEDURES    Procedures    DIAGNOSIS      Diagnosis:  Final diagnoses:   Facial droop       Disposition:  ED Disposition       ED Disposition   Discharge from ED Observation    Condition   --    Date/Time   Fri May 25, 2023  5:08 PM    Comment   --               Prescriptions:  Discharge Medication List as of 05/25/2023  5:09 PM        CONTINUE these medications which have NOT CHANGED    Details   aspirin  EC 325 MG tablet Take 1 tablet (325 mg) by mouth daily, Historical Med      atorvastatin  (LIPITOR) 80 MG tablet Take 1 tablet (80 mg) by mouth daily, Historical Med      hydroCHLOROthiazide  (HYDRODIURIL ) 25 MG tablet Take 1 tablet (25 mg) by mouth daily, Historical Med      LevETIRAcetam  (KEPPRA  PO) Take 500 mg by mouth 2 (two) times daily.   , Historical Med      lisinopril  (ZESTRIL ) 20 MG tablet Take 1 tablet (20 mg) by mouth daily, Historical Med                 This note was generated by the Epic EMR system/ Dragon speech recognition and may contain inherent errors or omissions not intended by  the user. Grammatical errors, random word insertions, deletions and pronoun errors  are occasional consequences of this technology due to software limitations. Not all errors are caught or corrected. If there are questions or concerns about the content of this note or information contained within the body of this dictation they should be addressed directly with the author for clarification.           [1]   Past Surgical History:  Procedure Laterality Date    APPENDECTOMY (OPEN)      CHOLECYSTECTOMY       EGD, BIOPSY N/A 04/16/2017    Procedure: EGD, BIOPSY;  Surgeon: Wilhelmina Jordan Basque, MD;  Location: [facility];  Service: Gastroenterology;  Laterality: N/A;    HYSTERECTOMY     [2]   Family History  Problem Relation Name Age of Onset    Myocardial Infarction Father  9    Deep vein thrombosis Father      No known problems Mother          Willy Leita BROCKS, DO  05/25/23 2034

## 2023-09-18 NOTE — OR Surgeon (Signed)
 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    Surgeon(s) & Assistant(s)     * Zachary Hug, MD - Primary  Selinda Ford PA-Assistant    I used a surgical PA because there was no general surgery resident available for this case.  The use of the PA was essential to the operation including with the proper positioning of the patient, manipulation of tissues, manipulation of instruments, traction, and closure of the incisions.        Anesthesia: general  ASA: III    Estimated Blood Loss: 5 mL    Drains:   Urethral Catheter Latex 16 Fr. (Active)      Specimen: No specimen      Procedure Details: This patient is a 48 year old female for a robotic assisted ventral hernia repair with mesh.  The patient has morbid obesity.  She had a long midline incision from a previous hysterectomy.  She has also had previous ventral hernia repair.  She presented to the hospital emergency department with nausea vomiting and increasing abdominal pain.  Noted to have incarcerated ventral hernia containing fat.  Today she underwent informed consent for a robotic assisted hernia.  All the risks and benefits to the procedure were discussed in detail.  I described the possibilities of bleeding, infections, intra-abdominal injury, intra-abdominal organ injury, hernia recurrence, poor wound healing among other potential complications.  I answered all the patient's questions.  She elected to proceed and signed a consent form which was witnessed by the nurse.    She was  brought into the operating room and laid supine.  Sequential compression device were placed on her legs.  She was given 3 g of Ancef IV as surgical prophylaxis.  The anesthesiology team intubated the patient endotracheally and administered a general anesthetic.  The surgical PA team placed a Foley cath into the urinary bladder.  The abdomen was prepped and draped in usual sterile fashion.  We conducted a timeout confirming the correct patient and planned procedure.  After the timeout, I did the abdominal cavity at the left upper quadrant using a 5 mm optical trocar.  A pneumoperitoneum was created with carbon dioxide gas to a pressure of 15 mmHg.  The laparoscope was inserted confirming no injuries upon entry to the abdominal cavity.  The laparoscope was then used to place an 8 mm robotic trocar at the left mid abdomen.  Laparoscopic scissors were then used to take down some small bowel from the anterior abdominal wall space at the left lower quadrant.  After creating some space we were able to place a robotic 12 mm trocar.  The da Vinci 5 robot was then brought into the field and docked to the trocars.  The camera was targeted to the midline anterior abdominal wall with the hernia was located.  That was arm 2.  Arm 1 and 3 were then docked to the trocars.  Arm 1 contained  a fenestrated bipolar and arm 3 contained a monopolar scissor.  Arm 4 was stowed.    I went to the surgeon's console and began doing extensive lysis of adhesions.  Patient has significant omentum adherent to the anterior abdominal wall.  There was a large at least 4 cm defect containing a lot of adipose tissue.  Patient was also noted to have small bowel going into the hernia sac which we lysed.  In doing so, an enterotomy was made.  This was quickly recognized.  The enterotomy was then closed with 3 oh V-Loc suture.  With the 2 layer closure.  I inspected the repair several times and it appeared to be nicely closed.  There was no spillage of any  bowel contents.  Next, attention was turned back to the adhesiolysis of the anterior abdominal wall.  We identify several other fascial defects as well as rectus diastases.  The patient had fascial defects ranging from 1 cm to 3 cm around the larger periumbilical defect which was 4 to 5 cm.  I then closed these fascial defects with 0 STRATAFIX suture.  The decision was made not to place the mesh given the enterotomy due to concerns for mesh infection.  If the patient recurs then we will bring her back at a later time for permanent mesh implantation.  The robot was then undocked.  The port site incisions were closed with 4-0 Monocryl.  They were injected with quarter percent Marcaine plain.  They were dressed with skin glue.  The patient was extubated in the operating room and taken to the postanesthesia care unit in satisfactory condition having tolerated the procedure well.  The nurse confirmed the counts being correct.        Findings: There was a large periumbilical ventral hernia of proximately 4 cm containing both small bowel and adipose tissue.  There are also multiple other defects measuring in size from 1 cm cm to 3 cm and a Swiss cheese defect.  The patient had dense adhesions of small bowel and omentum to the anterior abdominal wall.  During the adhesiolysis an enterotomy was made in the small bowel which was closed with a 3-0 V-Loc suture.  Because of the enterotomy and the concern for contamination no mesh was used.      Complications:  None; patient tolerated the procedure well.       Disposition: PACU - hemodynamically stable.     Condition: stable

## 2023-10-05 ENCOUNTER — Emergency Department

## 2023-10-05 ENCOUNTER — Emergency Department
Admission: EM | Admit: 2023-10-05 | Discharge: 2023-10-05 | Disposition: A | Discharge status: Home / Self Care | Attending: Emergency Medicine | Admitting: Emergency Medicine

## 2023-10-05 DIAGNOSIS — R1084 Generalized abdominal pain: Secondary | ICD-10-CM | POA: Insufficient documentation

## 2023-10-05 LAB — COMPREHENSIVE METABOLIC PANEL
ALT: 50 U/L (ref <=55)
AST (SGOT): 39 U/L (ref <=41)
Albumin/Globulin Ratio: 0.8 — ABNORMAL LOW (ref 0.9–2.2)
Albumin: 3.6 g/dL (ref 3.5–5.0)
Alkaline Phosphatase: 89 U/L (ref 37–117)
Anion Gap: 10 (ref 5.0–15.0)
BUN: 11 mg/dL (ref 7–21)
Bilirubin, Total: 0.8 mg/dL (ref 0.2–1.2)
CO2: 25 meq/L (ref 17–29)
Calcium: 8.8 mg/dL (ref 8.5–10.5)
Chloride: 111 meq/L (ref 99–111)
Creatinine: 0.7 mg/dL (ref 0.4–1.0)
GFR: >60 mL/min/1.73 m2 (ref >=60.0)
Globulin: 4.4 g/dL — ABNORMAL HIGH (ref 2.0–3.6)
Glucose: 112 mg/dL — ABNORMAL HIGH (ref 70–100)
Potassium: 3.8 meq/L (ref 3.5–5.3)
Protein, Total: 8 g/dL (ref 6.0–8.3)
Sodium: 146 meq/L — ABNORMAL HIGH (ref 135–145)

## 2023-10-05 LAB — LAB USE ONLY - CBC WITH DIFFERENTIAL
Absolute Basophils: 0.06 x10 3/uL (ref 0.00–0.08)
Absolute Eosinophils: 0.42 x10 3/uL (ref 0.00–0.44)
Absolute Immature Granulocytes: 0.02 x10 3/uL (ref 0.00–0.07)
Absolute Lymphocytes: 2.11 x10 3/uL (ref 0.42–3.22)
Absolute Monocytes: 0.51 x10 3/uL (ref 0.21–0.85)
Absolute Neutrophils: 5.68 x10 3/uL (ref 1.10–6.33)
Absolute nRBC: 0 x10 3/uL (ref <=0.00)
Basophils %: 0.7 %
Eosinophils %: 4.8 %
Hematocrit: 43 % (ref 34.7–43.7)
Hemoglobin: 13.8 g/dL (ref 11.4–14.8)
Immature Granulocytes %: 0.2 %
Lymphocytes %: 24 %
MCH: 30.9 pg (ref 25.1–33.5)
MCHC: 32.1 g/dL (ref 31.5–35.8)
MCV: 96.2 fL — ABNORMAL HIGH (ref 78.0–96.0)
MPV: 12.8 fL — ABNORMAL HIGH (ref 8.9–12.5)
Monocytes %: 5.8 %
Neutrophils %: 64.5 %
Platelet Count: 169 x10 3/uL (ref 142–346)
Preliminary Absolute Neutrophil Count: 5.68 x10 3/uL (ref 1.10–6.33)
RBC: 4.47 x10 6/uL (ref 3.90–5.10)
RDW: 13 % (ref 11–15)
WBC: 8.8 x10 3/uL (ref 3.10–9.50)
nRBC %: 0 /100{WBCs} (ref <=0.0)

## 2023-10-05 LAB — LIPASE: Lipase: 31 U/L (ref 8–78)

## 2023-10-05 MED ORDER — HYDROMORPHONE HCL 1 MG/ML IJ SOLN
0.5000 mg | Freq: Once | INTRAMUSCULAR | Status: AC
Start: 2023-10-05 — End: 2023-10-05
  Administered 2023-10-05: 0.5 mg via INTRAVENOUS
  Filled 2023-10-05: qty 1

## 2023-10-05 MED ORDER — ALUM & MAG HYDROXIDE-SIMETH 200-200-20 MG/5ML PO SUSP
30.0000 mL | Freq: Once | ORAL | Status: AC
Start: 2023-10-05 — End: 2023-10-05
  Administered 2023-10-05: 30 mL via ORAL
  Filled 2023-10-05: qty 30

## 2023-10-05 MED ORDER — ONDANSETRON HCL 4 MG/2ML IJ SOLN
4.0000 mg | Freq: Once | INTRAMUSCULAR | Status: AC
Start: 2023-10-05 — End: 2023-10-05
  Administered 2023-10-05: 4 mg via INTRAVENOUS
  Filled 2023-10-05: qty 2

## 2023-10-05 NOTE — ED Notes (Addendum)
 Patient will be [facility] POV. Pov agreement and emtala were signed by patient. Patient's blood pressure is elevated, MD is aware. Report was given to [facility], Charity fundraiser. Emtala, ED summary, POV agreement, CT scan CD were given to the patient.

## 2023-10-05 NOTE — EDIE (Signed)
 [facility] NOTIFICATION 10/05/2023 12:40 Symes, [name] Cook DOB: [birth year] MRN: [id number]    [place] - [name] Cook's patient encounter information:   FMW:?[id number]  Account [id number]  Billing Account [id number]      Criteria Met      3 Different Facilities in 90 Days    5 ED Visits in 12 Months    Security and Safety  No Security Events were found.  ED Care Guidelines  There are currently no ED Care Guidelines for this patient. Please check your facility's medical records system.        Prescription Monitoring Program  Narx Score not available at this time.    E.D. Visit Count (12 mo.)  Facility Visits   [facility] 7   [facility] 5   [facility] 2   [facility] 1   Total 15   Note: Visits indicate total known visits.     Recent Emergency Department Visit Summary  Showing 10 most recent visits out of 15 in the past 12 months   Date Facility [facility] Type Diagnoses or Chief Complaint    Oct 05, 2023  [name]  [name]  Emergency      bowel problem      Sep 10, 2023  [facility]  [place]  [place]  Emergency      Essential (primary) hypertension      Bell's palsy      Other      HBP, FACIAL DROOP      Aug 04, 2023  [name]  [place]  Emergency      Upper abdominal pain, unspecified      Syncope and collapse      Acute gastritis without bleeding      Acute cystitis without hematuria      Abdominal Pain      Abdomial Pain; Dizziness      Jun 05, 2023  [facility] [place] [facility]  [name].  [place]  Emergency      Facial weakness      Stroke      Facial droop      May 25, 2023  [name]  [name]  [name]  Emergency      Facial weakness      Facial Droop      Facial Drop; Chest Pain; Blurred Vision      Facial Drop; Chest Pain      May 01, 2023  [name]  [place]  Emergency      Syncope and collapse      Fall      May 01, 2023  [name]  MD   Emergency      Fall      Apr 30, 2023  [name]  [place]  Emergency      Syncope and collapse      Chest pain, unspecified      Right upper quadrant pain      Syncope - Loss of consciousness      Pain, Chest      Chest pain, shortness of breth      Mar 10, 2023   [name]  [place]  [place]  Emergency      Essential (primary) hypertension      Weakness  Chest pain, unspecified      Chest Pain      Abdominal Pain      Facial Droop      Triage      Feb 03, 2023  [name]  MD  Emergency      Essential (primary) hypertension      Chest pain, unspecified      Acute cough      Shortness of breath      Shortness Of Breath      Pain, Chest      chest pain, SOB        Recent Inpatient Visit Summary  Date Facility [facility] Type Diagnoses or Chief Complaint    Oct 30, 2022  [name]  [name]  [place] [name]  [facility] Surgical      Facial weakness      Nontraumatic intracerebral hemorrhage, unspecified        Care Team  No Care Team was found.  [facility]  This patient has registered at the [facility] Emergency Department  For more information visit: [email or web address]     PLEASE NOTE:     1.   Any care recommendations and other clinical information are provided as guidelines or for historical purposes only, and providers should exercise their own clinical judgment when providing care.    2.   You may only use this information for purposes of treatment, payment or health care operations activities, and subject to the limitations of applicable [facility] Policies.    3.   You should consult directly with the organization that provided a care guideline or other clinical history with any questions about additional information or accuracy or completeness of information provided.    ? 2025 [facility] - [email or web address]

## 2023-10-05 NOTE — ED Provider Notes (Signed)
 [facility]  Emergency Department Physician Note      Diagnosis/Disposition     ED Disposition:  Transfer to Another Facility    ED Diagnosis:  Generalized abdominal pain    Discharge Medication List as of 10/05/2023 11:17 PM          History of Present Illness     History  Medical History[1] Past Surgical History[2] Social History[3] Family History[4] Allergies[5] Medications Ordered Prior to Encounter[6]   :[id number]}  Chief Complaint: Abdominal Pain, Emesis, and Nausea     48 y.o. female with past medical history as below   History of Present Illness  [name] Cook is a 48 year old female who presents with severe abdominal pain and inability to have a bowel movement following recent abdominal surgery. She was advised by a nurse on the back of her insurance card to seek evaluation at a different hospital due to ongoing symptoms.    [name] Cook underwent abdominal surgery at [facility] and was discharged on July 10th. Since discharge, she has experienced severe abdominal pain, inability to have a bowel movement, and absence of flatus. Her vomit is greenish-brown with a fecal odor. Today marks the sixth day without a bowel movement. She has tried enemas, suppositories, prune juice, and supplements without success.    She has a known allergy to contrast, which previously led to ICU admission and intubation despite premedication. She also had an allergic reaction to morphine , resulting in red spots on her arms, managed with Benadryl . Post-surgery, she was given dilaudid  for pain management.    Independent Historian (other than patient): No  Additional History Provided by Independent Historian:       Physical Exam     ED Triage Vitals   Encounter Vitals Group      BP 10/05/23 1900 (!) 159/91      Girls Systolic BP Percentile --       Girls Diastolic BP Percentile --       Boys Systolic BP Percentile --       Boys Diastolic BP Percentile --       Heart Rate 10/05/23 1900 72      Resp Rate 10/05/23  1900 20      Temp 10/05/23 1900 98.4 F (36.9 C)      Temp src 10/05/23 1858 Oral      SpO2 10/05/23 1900 99 %      Weight 10/05/23 1858 (!) 145.2 kg      Height 10/05/23 1858 1.778 m      Head Circumference --       Peak Flow --       Pain Score 10/05/23 1858 8      Pain Loc --       Pain Education --       Exclude from Growth Chart --       Physical Exam  Constitutional:       Appearance: She is not ill-appearing or diaphoretic.   HENT:      Head: Normocephalic and atraumatic.      Nose: Nose normal.      Mouth/Throat:      Mouth: Mucous membranes are moist.   Eyes:      Conjunctiva/sclera: Conjunctivae normal.   Cardiovascular:      Rate and Rhythm: Normal rate and regular rhythm.      Heart sounds: Normal heart sounds.   Pulmonary:      Effort: Pulmonary effort is normal. No respiratory  distress.      Breath sounds: Normal breath sounds. No stridor. No wheezing, rhonchi or rales.   Abdominal:      General: Bowel sounds are decreased. There is distension.      Palpations: Abdomen is soft.      Tenderness: There is generalized abdominal tenderness. There is no guarding or rebound.   Musculoskeletal:      Cervical back: Neck supple. No rigidity.      Right lower leg: No edema.      Left lower leg: No edema.   Skin:     General: Skin is warm.      Capillary Refill: Capillary refill takes less than 2 seconds.   Neurological:      General: No focal deficit present.      Mental Status: She is alert.   Psychiatric:         Mood and Affect: Mood normal.                 Medical Decision Making      PRIMARY PROBLEM LIST      1. Chronic Illness with Exacerbation/Progression DIAGNOSIS:generalized abdominal pain   Chronic Illness Impacting Care of the above problem: Other (explain) recent postoperative stateIncreases complexity of evaluation   Differential Diagnosis: Abdominal pain non traumatic:  gastritis, gastroenteritis, enteritis, colitis, diverticulitis, appendicitis, cholecystitis, pancreatitis, AAA, obstruction,  constipation, stone     DISCUSSION      s/p 7/8 robotic VHR by Dr. Julieanne at [facility]   ED Course as of 10/05/23 2347   Fri Oct 05, 2023   2001 Dr. Julieanne: [phone] (Work)  [ML]   2048 WBC: 8.80  No leukocytosis [ML]   2048 Hemoglobin: 13.8  No anemia [ML]   2054 Creatinine: 0.7  Normal renal function [ML]   2055 Lipase: 31  No pancreatitis [ML]   2112 CT Abdomen Pelvis WO IV/ WO PO Cont  Read as negative,will contact surgeon [ML]   2147 Spoke with patient's surgery team, covered by Dr. Cesario, has previously recommended to patient that she come into the office, which she has not done. Could be seen in AM for evaluation if at [facility]. Will be deferring narcotics at this time given risk of ileus. Patient given maalox. Discussed results with patient, no bowel obstruction at this time. Offered transfer to [facility] so she could be evaluated by general surgery. Would not be appropriate to admit to this hospital as her surgeon does not operate here. [ML]   2151 Patient tolerated maalox PO [ML]      ED Course User Index  [ML] Lawrence Roldan, DO       The patient was transferred to another facility for further management of their care. We discussed all aspects of the work-up completed in the ED, any pending studies/therapies, and all clinical decision making at the time care was handed off to the next provider. Appropriate EMTALA and transfer paperwork was completed and can be found in the patient's medical record.     Additional Notes         Was management discussed with a consultant?: Yes (explain) general surgery team at [facility]     External Records Reviewed?: Physician Office Records , inpatient records                      Vital Signs: Reviewed the patient's vital signs.   Nursing Notes: Reviewed and utilized available nursing notes.   Medical Records Reviewed:  Reviewed available past medical records.   Counseling: The emergency provider has spoken with the patient and discussed today's findings, in addition  to providing specific details for the plan of care. Questions are answered and there is agreement with the plan.     CRITICAL CARE/PROCEDURES    Procedures        CARDIAC STUDIES      The following cardiac studies were independently interpreted by me the Emergency Medicine Provider. For full cardiac study results please see chart.                                                                   EMERGENCY IMAGING STUDIES    The following imagine studies were independently interpreted by me (emergency medicine provider):  The following imagine studies were independently interpreted by me (emergency medicine provider):                  CT Abdomen/Pelvis Interpreted by me (ED Provider)      RESULT: No Free fluid. No ureteral Stone.  No free air   IMPRESSION: No acute abnormality            Supplemental Encounter Data   Medical History[7]  Past Surgical History[8]  Social History[9]  Family History[10]  Allergies[11]    Encounter Orders:  Orders Placed This Encounter   Procedures    CT Abdomen Pelvis WO IV/ WO PO Cont    CBC with Differential (Order)    Comprehensive Metabolic Panel    Lipase    Urinalysis with Reflex to Microscopic Exam    Urine Elnor Culture Hold Tube    CBC with Differential (Component)     Medications Administered:  Medications   HYDROmorphone  (DILAUDID ) injection 0.5 mg (0.5 mg Intravenous Given 10/05/23 2011)   ondansetron  (ZOFRAN ) injection 4 mg (4 mg Intravenous Given 10/05/23 2011)   alum & mag hydroxide-simethicone  (MAALOX PLUS) 200-200-20 mg/5 mL suspension 30 mL (30 mLs Oral Given 10/05/23 2134)     Laboratory and Imaging Studies:  Results for orders placed or performed during the hospital encounter of 10/05/23 (from the past 24 hours)   CBC with Differential (Component)    Collection Time: 10/05/23  8:05 PM   Result Value    WBC 8.80    Hemoglobin 13.8    Hematocrit 43.0    Platelet Count 169    MPV 12.8 (H)    RBC 4.47    MCV 96.2 (H)    MCH 30.9    MCHC 32.1    RDW 13    nRBC % 0.0     Absolute nRBC 0.00    Preliminary Absolute Neutrophil Count 5.68    Neutrophils % 64.5    Lymphocytes % 24.0    Monocytes % 5.8    Eosinophils % 4.8    Basophils % 0.7    Immature Granulocytes % 0.2    Absolute Neutrophils 5.68    Absolute Lymphocytes 2.11    Absolute Monocytes 0.51    Absolute Eosinophils 0.42    Absolute Basophils 0.06    Absolute Immature Granulocytes 0.02   Comprehensive Metabolic Panel    Collection Time: 10/05/23  8:26 PM   Result Value    Glucose 112 (H)    BUN  11    Creatinine 0.7    Sodium 146 (H)    Potassium 3.8    Chloride 111    CO2 25    Calcium  8.8    Anion Gap 10.0    GFR >60.0    AST (SGOT) 39    ALT 50    Alkaline Phosphatase 89    Albumin  3.6    Protein, Total 8.0    Globulin 4.4 (H)    Albumin /Globulin Ratio 0.8 (L)    Bilirubin, Total 0.8    Collection Time: 10/05/23  8:26 PM   Result Value    Lipase 31     CT Abdomen Pelvis WO IV/ WO PO Cont   Final Result      No bowel obstruction.      Rolene Stank, MD   10/05/2023 9:02 PM                     [1]   Past Medical History:  Diagnosis Date    Chest pain     h/o multiple ED visits and admissions for chest pain    Conversion disorder     per H&P on 06/18/2021    Hyperlipidemia     Hypertension     Migraine     Morbid obesity (CMS/HCC)     Palpitations     loop recorder placed in 2019, had to be removed after a month due to misplacement and keloid formation per patient    Seizures (CMS/HCC)     Stroke (CMS/HCC)     Multiple admissions since 2015 for stroke-like symptoms (facial droop, paresthesias). No MRI confirmed acute stroke.    Thoracic aortic aneurysm     4.1 cm on MRA chest on 07/20/2022   [2]   Past Surgical History:  Procedure Laterality Date    ABDOMINAL HERNIA REPAIR      APPENDECTOMY (OPEN)      CHOLECYSTECTOMY      EGD, BIOPSY N/A 04/16/2017    Procedure: EGD, BIOPSY;  Surgeon: Wilhelmina Jordan Basque, MD;  Location: [facility];  Service: Gastroenterology;  Laterality: N/A;    HYSTERECTOMY     [3]   Social History  Tobacco  Use    Smoking status: Never    Smokeless tobacco: Never   Vaping Use    Vaping status: Never Used   Substance Use Topics    Alcohol use: No    Drug use: No   [4]   Family History  Problem Relation Name Age of Onset    Myocardial Infarction Father  72    Deep vein thrombosis Father      No known problems Mother     [5]   Allergies  Allergen Reactions    Contrast [Iodinated Contrast Media] Anaphylaxis     Patient woke up in ICU after having CT with contrast, last remembers being in CT    Fentanyl  Anaphylaxis     Throat swelling    Fioricet [Butalbital-Apap-Caffeine] Hives    Flexeril  [Cyclobenzaprine ] Edema     Causes throat swelling per Pt report    Motrin [Ibuprofen] Anaphylaxis     Throat swelling    Tramadol Anaphylaxis     Throat swelling    Bentyl  [Dicyclomine ] Edema    Percocet [Oxycodone -Acetaminophen ] Hives    Shellfish-Derived Products Hives    Toradol [Ketorolac Tromethamine] Hives    Tylenol  [Acetaminophen ] Hives   [6]   No current facility-administered medications on file prior to encounter.  Current Outpatient Medications on File Prior to Encounter   Medication Sig Dispense Refill    aspirin  EC 325 MG tablet Take 1 tablet (325 mg) by mouth daily      atorvastatin  (LIPITOR) 80 MG tablet Take 1 tablet (80 mg) by mouth daily      hydroCHLOROthiazide  (HYDRODIURIL ) 25 MG tablet Take 1 tablet (25 mg) by mouth daily      LevETIRAcetam  (KEPPRA  PO) Take 500 mg by mouth 2 (two) times daily.         lisinopril  (ZESTRIL ) 20 MG tablet Take 1 tablet (20 mg) by mouth daily     [7]   Past Medical History:  Diagnosis Date    Chest pain     h/o multiple ED visits and admissions for chest pain    Conversion disorder     per H&P on 06/18/2021    Hyperlipidemia     Hypertension     Migraine     Morbid obesity (CMS/HCC)     Palpitations     loop recorder placed in 2019, had to be removed after a month due to misplacement and keloid formation per patient    Seizures (CMS/HCC)     Stroke (CMS/HCC)     Multiple admissions  since 2015 for stroke-like symptoms (facial droop, paresthesias). No MRI confirmed acute stroke.    Thoracic aortic aneurysm     4.1 cm on MRA chest on 07/20/2022   [8]   Past Surgical History:  Procedure Laterality Date    ABDOMINAL HERNIA REPAIR      APPENDECTOMY (OPEN)      CHOLECYSTECTOMY      EGD, BIOPSY N/A 04/16/2017    Procedure: EGD, BIOPSY;  Surgeon: Wilhelmina Jordan Basque, MD;  Location: [facility];  Service: Gastroenterology;  Laterality: N/A;    HYSTERECTOMY     [9]   Social History  Tobacco Use    Smoking status: Never    Smokeless tobacco: Never   Vaping Use    Vaping status: Never Used   Substance Use Topics    Alcohol use: No    Drug use: No   [10]   Family History  Problem Relation Name Age of Onset    Myocardial Infarction Father  15    Deep vein thrombosis Father      No known problems Mother     [11]   Allergies  Allergen Reactions    Contrast [Iodinated Contrast Media] Anaphylaxis     Patient woke up in ICU after having CT with contrast, last remembers being in CT    Fentanyl  Anaphylaxis     Throat swelling    Fioricet [Butalbital-Apap-Caffeine] Hives    Flexeril  [Cyclobenzaprine ] Edema     Causes throat swelling per Pt report    Motrin [Ibuprofen] Anaphylaxis     Throat swelling    Tramadol Anaphylaxis     Throat swelling    Bentyl  [Dicyclomine ] Edema    Percocet [Oxycodone -Acetaminophen ] Hives    Shellfish-Derived Products Hives    Toradol [Ketorolac Tromethamine] Hives    Tylenol  [Acetaminophen ] Hives        [place], Braxtyn Bojarski, DO  10/05/23 2348
